# Patient Record
Sex: Male | Born: 1970 | Race: White | Hispanic: No | Marital: Single | Smoking: Never smoker
Health system: Southern US, Community
[De-identification: ages and names within clinical notes are randomized; demographics above are authoritative.]

## PROBLEM LIST (undated history)

## (undated) DIAGNOSIS — D649 Anemia, unspecified: Secondary | ICD-10-CM

## (undated) DIAGNOSIS — F419 Anxiety disorder, unspecified: Secondary | ICD-10-CM

## (undated) DIAGNOSIS — J302 Other seasonal allergic rhinitis: Secondary | ICD-10-CM

## (undated) DIAGNOSIS — G4730 Sleep apnea, unspecified: Secondary | ICD-10-CM

## (undated) DIAGNOSIS — K219 Gastro-esophageal reflux disease without esophagitis: Secondary | ICD-10-CM

## (undated) DIAGNOSIS — E785 Hyperlipidemia, unspecified: Secondary | ICD-10-CM

## (undated) DIAGNOSIS — K5792 Diverticulitis of intestine, part unspecified, without perforation or abscess without bleeding: Secondary | ICD-10-CM

## (undated) DIAGNOSIS — I10 Essential (primary) hypertension: Secondary | ICD-10-CM

## (undated) DIAGNOSIS — J189 Pneumonia, unspecified organism: Secondary | ICD-10-CM

## (undated) DIAGNOSIS — F32A Depression, unspecified: Secondary | ICD-10-CM

## (undated) DIAGNOSIS — T8859XA Other complications of anesthesia, initial encounter: Secondary | ICD-10-CM

## (undated) DIAGNOSIS — J45909 Unspecified asthma, uncomplicated: Secondary | ICD-10-CM

## (undated) DIAGNOSIS — F329 Major depressive disorder, single episode, unspecified: Secondary | ICD-10-CM

## (undated) DIAGNOSIS — T7840XA Allergy, unspecified, initial encounter: Secondary | ICD-10-CM

## (undated) DIAGNOSIS — R0600 Dyspnea, unspecified: Secondary | ICD-10-CM

## (undated) DIAGNOSIS — R519 Headache, unspecified: Secondary | ICD-10-CM

## (undated) DIAGNOSIS — M1990 Unspecified osteoarthritis, unspecified site: Secondary | ICD-10-CM

## (undated) HISTORY — DX: Depression, unspecified: F32.A

## (undated) HISTORY — DX: Unspecified asthma, uncomplicated: J45.909

## (undated) HISTORY — PX: COLON SURGERY: SHX602

## (undated) HISTORY — DX: Allergy, unspecified, initial encounter: T78.40XA

## (undated) HISTORY — PX: OTHER SURGICAL HISTORY: SHX169

## (undated) HISTORY — DX: Hyperlipidemia, unspecified: E78.5

## (undated) HISTORY — DX: Anemia, unspecified: D64.9

## (undated) HISTORY — DX: Unspecified osteoarthritis, unspecified site: M19.90

## (undated) HISTORY — DX: Major depressive disorder, single episode, unspecified: F32.9

---

## 1998-09-21 ENCOUNTER — Other Ambulatory Visit: Admission: RE | Admit: 1998-09-21 | Discharge: 1998-09-21 | Payer: Self-pay | Admitting: Gastroenterology

## 2000-08-16 ENCOUNTER — Encounter: Admission: RE | Admit: 2000-08-16 | Discharge: 2000-08-16 | Payer: Self-pay | Admitting: Orthopedic Surgery

## 2000-08-16 ENCOUNTER — Encounter: Payer: Self-pay | Admitting: Orthopedic Surgery

## 2001-09-05 ENCOUNTER — Encounter
Admission: RE | Admit: 2001-09-05 | Discharge: 2001-09-05 | Payer: Self-pay | Admitting: Physical Medicine and Rehabilitation

## 2001-09-05 ENCOUNTER — Encounter: Payer: Self-pay | Admitting: Physical Medicine and Rehabilitation

## 2001-09-22 ENCOUNTER — Encounter: Payer: Self-pay | Admitting: Internal Medicine

## 2001-09-22 ENCOUNTER — Encounter: Admission: RE | Admit: 2001-09-22 | Discharge: 2001-09-22 | Payer: Self-pay | Admitting: Internal Medicine

## 2001-10-21 ENCOUNTER — Ambulatory Visit (HOSPITAL_COMMUNITY): Admission: RE | Admit: 2001-10-21 | Discharge: 2001-10-21 | Payer: Self-pay | Admitting: Gastroenterology

## 2001-10-21 ENCOUNTER — Encounter: Payer: Self-pay | Admitting: Gastroenterology

## 2001-10-24 ENCOUNTER — Encounter: Payer: Self-pay | Admitting: Gastroenterology

## 2001-10-24 ENCOUNTER — Ambulatory Visit (HOSPITAL_COMMUNITY): Admission: RE | Admit: 2001-10-24 | Discharge: 2001-10-24 | Payer: Self-pay | Admitting: Gastroenterology

## 2007-01-13 ENCOUNTER — Ambulatory Visit (HOSPITAL_COMMUNITY): Admission: RE | Admit: 2007-01-13 | Discharge: 2007-01-13 | Payer: Self-pay | Admitting: Internal Medicine

## 2007-01-24 ENCOUNTER — Encounter: Admission: RE | Admit: 2007-01-24 | Discharge: 2007-01-24 | Payer: Self-pay | Admitting: Gastroenterology

## 2009-02-28 ENCOUNTER — Encounter: Payer: Self-pay | Admitting: Cardiology

## 2009-04-13 ENCOUNTER — Ambulatory Visit: Payer: Self-pay | Admitting: Cardiology

## 2009-04-13 DIAGNOSIS — I10 Essential (primary) hypertension: Secondary | ICD-10-CM | POA: Insufficient documentation

## 2009-04-13 DIAGNOSIS — R079 Chest pain, unspecified: Secondary | ICD-10-CM | POA: Insufficient documentation

## 2009-04-13 DIAGNOSIS — R109 Unspecified abdominal pain: Secondary | ICD-10-CM | POA: Insufficient documentation

## 2009-04-27 ENCOUNTER — Telehealth (INDEPENDENT_AMBULATORY_CARE_PROVIDER_SITE_OTHER): Payer: Self-pay | Admitting: *Deleted

## 2009-04-28 ENCOUNTER — Ambulatory Visit: Payer: Self-pay

## 2009-04-28 ENCOUNTER — Encounter: Payer: Self-pay | Admitting: Cardiology

## 2009-05-03 ENCOUNTER — Encounter: Payer: Self-pay | Admitting: Cardiology

## 2009-05-10 ENCOUNTER — Telehealth: Payer: Self-pay | Admitting: Cardiology

## 2011-11-04 ENCOUNTER — Ambulatory Visit (INDEPENDENT_AMBULATORY_CARE_PROVIDER_SITE_OTHER): Payer: BC Managed Care – PPO

## 2011-11-04 DIAGNOSIS — J111 Influenza due to unidentified influenza virus with other respiratory manifestations: Secondary | ICD-10-CM

## 2011-11-06 ENCOUNTER — Ambulatory Visit (INDEPENDENT_AMBULATORY_CARE_PROVIDER_SITE_OTHER): Payer: BC Managed Care – PPO

## 2011-11-06 DIAGNOSIS — R05 Cough: Secondary | ICD-10-CM

## 2011-11-06 DIAGNOSIS — R059 Cough, unspecified: Secondary | ICD-10-CM

## 2011-11-06 DIAGNOSIS — J209 Acute bronchitis, unspecified: Secondary | ICD-10-CM

## 2011-12-13 ENCOUNTER — Other Ambulatory Visit: Payer: Self-pay | Admitting: Internal Medicine

## 2011-12-13 ENCOUNTER — Other Ambulatory Visit: Payer: Self-pay | Admitting: Family Medicine

## 2011-12-17 ENCOUNTER — Other Ambulatory Visit: Payer: Self-pay | Admitting: Internal Medicine

## 2011-12-17 ENCOUNTER — Other Ambulatory Visit: Payer: Self-pay | Admitting: Family Medicine

## 2011-12-17 ENCOUNTER — Ambulatory Visit (INDEPENDENT_AMBULATORY_CARE_PROVIDER_SITE_OTHER): Payer: BC Managed Care – PPO | Admitting: Internal Medicine

## 2011-12-17 VITALS — BP 121/78 | HR 99 | Temp 98.0°F | Resp 16 | Ht 72.0 in | Wt 250.0 lb

## 2011-12-17 DIAGNOSIS — R0602 Shortness of breath: Secondary | ICD-10-CM

## 2011-12-17 DIAGNOSIS — H1130 Conjunctival hemorrhage, unspecified eye: Secondary | ICD-10-CM

## 2011-12-17 DIAGNOSIS — H1131 Conjunctival hemorrhage, right eye: Secondary | ICD-10-CM

## 2011-12-17 DIAGNOSIS — R002 Palpitations: Secondary | ICD-10-CM

## 2011-12-17 DIAGNOSIS — K219 Gastro-esophageal reflux disease without esophagitis: Secondary | ICD-10-CM

## 2011-12-17 DIAGNOSIS — G4700 Insomnia, unspecified: Secondary | ICD-10-CM

## 2011-12-17 MED ORDER — ZOLPIDEM TARTRATE 10 MG PO TABS: 10.0000 mg | ORAL_TABLET | Freq: Every evening | ORAL | Status: DC | PRN

## 2011-12-17 MED ORDER — ESOMEPRAZOLE MAGNESIUM 40 MG PO CPDR: 40.0000 mg | DELAYED_RELEASE_CAPSULE | Freq: Every day | ORAL | Status: DC

## 2011-12-17 NOTE — Telephone Encounter (Signed)
Rx was found on printer and not given to patient.  I called in Ambien to [facility] on [address].

## 2011-12-17 NOTE — Progress Notes (Signed)
  Subjective:    Patient ID: Randall Reed, male    DOB: September 06, 1971, 41 y.o.   MRN: [id number]  HPIthis 41 year old with known hypertension and obesity who is on medication for anxiety GERD hypertension and allergies had an event at the gym yesterday. He is pursuing vigorous exercise and attempt to lose weight and at the end of a treadmill walk noticed that he was having palpitations associated with shortness of breath. There was no chest pain and no diaphoresis. This episode took more than an hour to resolve. He took one Xanax and seemed to improve slightly but did notice that he was back to normal until bedtime. His blood pressure was just above normal after this event. He had no trouble sleeping and when he woke this morning all his symptoms were resolved.    Review of Systemshe has had no recent illness and has been doing well with his exercise program. His GERD is controlled with Nexium and it is time for a refill.his insomnia is responding very well to Ambien and he is also ready for refill on that     Objective:   Physical Examvital signs are within normal limits HEENT is normal except for a small sub-conjunctiva hemorrhage in the right eye inferiorly There is no thyromegaly The heart is regular with no murmur rub or gallop rate is 80 The lungs are clear Cranial nerves II through XII are intact and Romberg is negative    EKG is within normal limits    Assessment & Plan:  Problem #1 palpitations  problem #2shortness of breath Problem #3 subconjunctival hemorrhage  His event sounds like a form of paroxysmal atrial tachycardia. He has never had this before. He uses a supplement with 100 mg of caffeine in it but did not use that to access yesterday.  For now and to reassure him to continue exercising. If this event repeats itself we will order a Holter monitor  Both Nexium and Ambien were filled

## 2011-12-20 ENCOUNTER — Other Ambulatory Visit: Payer: Self-pay

## 2011-12-27 ENCOUNTER — Other Ambulatory Visit: Payer: Self-pay | Admitting: Internal Medicine

## 2012-01-24 ENCOUNTER — Other Ambulatory Visit: Payer: Self-pay | Admitting: Family Medicine

## 2012-01-24 MED ORDER — LISINOPRIL-HYDROCHLOROTHIAZIDE 10-12.5 MG PO TABS: 1.0000 | ORAL_TABLET | Freq: Every day | ORAL | Status: DC

## 2012-01-28 ENCOUNTER — Other Ambulatory Visit: Payer: Self-pay | Admitting: Internal Medicine

## 2012-02-04 ENCOUNTER — Ambulatory Visit (INDEPENDENT_AMBULATORY_CARE_PROVIDER_SITE_OTHER): Payer: BC Managed Care – PPO | Admitting: Internal Medicine

## 2012-02-04 VITALS — BP 120/80 | HR 80 | Temp 98.3°F | Resp 18 | Ht 70.5 in | Wt 236.0 lb

## 2012-02-04 DIAGNOSIS — R002 Palpitations: Secondary | ICD-10-CM

## 2012-02-04 DIAGNOSIS — Z6833 Body mass index (BMI) 33.0-33.9, adult: Secondary | ICD-10-CM | POA: Insufficient documentation

## 2012-02-04 DIAGNOSIS — K219 Gastro-esophageal reflux disease without esophagitis: Secondary | ICD-10-CM

## 2012-02-04 DIAGNOSIS — R079 Chest pain, unspecified: Secondary | ICD-10-CM

## 2012-02-04 DIAGNOSIS — G4700 Insomnia, unspecified: Secondary | ICD-10-CM

## 2012-02-04 DIAGNOSIS — F411 Generalized anxiety disorder: Secondary | ICD-10-CM | POA: Insufficient documentation

## 2012-02-04 DIAGNOSIS — I10 Essential (primary) hypertension: Secondary | ICD-10-CM

## 2012-02-04 DIAGNOSIS — J309 Allergic rhinitis, unspecified: Secondary | ICD-10-CM | POA: Insufficient documentation

## 2012-02-04 DIAGNOSIS — E669 Obesity, unspecified: Secondary | ICD-10-CM | POA: Insufficient documentation

## 2012-02-04 DIAGNOSIS — G43909 Migraine, unspecified, not intractable, without status migrainosus: Secondary | ICD-10-CM | POA: Insufficient documentation

## 2012-02-04 LAB — COMPREHENSIVE METABOLIC PANEL
ALT: 20 U/L (ref 0–53)
CO2: 29 mEq/L (ref 19–32)
Calcium: 10.3 mg/dL (ref 8.4–10.5)
Chloride: 103 mEq/L (ref 96–112)
Creat: 1.05 mg/dL (ref 0.50–1.35)
Glucose, Bld: 98 mg/dL (ref 70–99)
Sodium: 139 mEq/L (ref 135–145)
Total Bilirubin: 0.6 mg/dL (ref 0.3–1.2)
Total Protein: 7.5 g/dL (ref 6.0–8.3)

## 2012-02-04 LAB — T4, FREE: Free T4: 1.49 ng/dL (ref 0.80–1.80)

## 2012-02-04 LAB — POCT CBC
Granulocyte percent: 66.8 %G (ref 37–80)
HCT, POC: 46.7 % (ref 43.5–53.7)
Hemoglobin: 15.4 g/dL (ref 14.1–18.1)
MPV: 8.8 fL (ref 0–99.8)
POC Granulocyte: 5.3 (ref 2–6.9)
RBC: 5.28 M/uL (ref 4.69–6.13)

## 2012-02-04 LAB — TSH: TSH: 1.035 u[IU]/mL (ref 0.350–4.500)

## 2012-02-04 LAB — POCT GLYCOSYLATED HEMOGLOBIN (HGB A1C): Hemoglobin A1C: 4.9

## 2012-02-04 LAB — LIPID PANEL: Cholesterol: 176 mg/dL (ref 0–200)

## 2012-02-04 MED ORDER — ALPRAZOLAM 1 MG PO TABS: 1.0000 mg | ORAL_TABLET | Freq: Three times a day (TID) | ORAL | Status: DC | PRN

## 2012-02-04 MED ORDER — LISINOPRIL-HYDROCHLOROTHIAZIDE 10-12.5 MG PO TABS: 1.0000 | ORAL_TABLET | Freq: Every day | ORAL | Status: DC

## 2012-02-04 MED ORDER — FEXOFENADINE-PSEUDOEPHED ER 180-240 MG PO TB24: 1.0000 | ORAL_TABLET | Freq: Every day | ORAL | Status: DC

## 2012-02-04 NOTE — Progress Notes (Signed)
Subjective:    Patient ID: Randall Reed, male    DOB: [birth year], 41 y.o.   MRN: [id number]  HPIHe has had 2 recent episodes of chest pain associated with a shaky feeling in the aftermath of his exercise routine. These have not improved with eating and are not necessarily associated with dehydration. They may last up to an hour and a half. He thinks this shakiness or jitteriness may actually be palpitations. The chest pain is left anterior and radiates to the left neck or left shoulder and is not associated with shortness of breath or diaphoresis or nausea. He was evaluated Dr. Juanda Chance with an exercise treadmill test about 3 years ago which was normal. He has now lost 62 pounds with his exercise routine and describes no excessive fatigue or unusual symptoms while he is exercising. At his most recent event he seemed to stumble and lose his sense of where he was as he stepped into the whirlpool at the gym-This was fleeting but he continued to feel shaky for an hour and a half. He has a history of exercise-induced asthma probably related to his reflux disease but this has been inactive during his recent exercise. His reflux continues to be intermittently problematic. He was diagnosed with obstructive sleep apnea, but could not tolerate the mask, and now his symptoms have disappeared with dramatic weight loss. His blood pressure during these events as always around diastolic 90 His a.m. Blood pressure this week before medication has been diastolic 80   Family history is pertinent for severe coronary disease and death by myocardial infarction in his father who was also a diabetic Review of SystemsDenies night sweats, vision changes, orthopnea, edema, cough, nausea, constipation, urinary changes, memory loss, coordination problems, His irritable bowel symptoms are no longer present. His migraine headaches are fairly well controlled. His anxiety is stable. His insomnia has responded to Ambien very well. His  chronic back discomfort is treated by chiropractor [facility] with great success.    Objective:   Physical Exam Vital signs Stable except weight  HEENT is clear The heart is regular without murmurs rubs or gallops/No carotid bruits Lungs are clear There is no peripheral edema Neurological is intact   EKG is within normal limits including rhythm strip     Results for orders placed in visit on 02/04/12  POCT CBC      Component Value Range   WBC 8.0  4.6 - 10.2 (K/uL)   Lymph, poc 2.2  0.6 - 3.4    POC LYMPH PERCENT 27.2  10 - 50 (%L)   MID (cbc) 0.5  0 - 0.9    POC MID % 6.0  0 - 12 (%M)   POC Granulocyte 5.3  2 - 6.9    Granulocyte percent 66.8  37 - 80 (%G)   RBC 5.28  4.69 - 6.13 (M/uL)   Hemoglobin 15.4  14.1 - 18.1 (g/dL)   HCT, POC 78.2  95.6 - 53.7 (%)   MCV 88.4  80 - 97 (fL)   MCH, POC 29.2  27 - 31.2 (pg)   MCHC 33.0  31.8 - 35.4 (g/dL)   RDW, POC 21.3     Platelet Count, POC 248  142 - 424 (K/uL)   MPV 8.8  0 - 99.8 (fL)  POCT GLYCOSYLATED HEMOGLOBIN (HGB A1C)      Component Value Range   Hemoglobin A1C 4.9      Assessment & Plan:  Problem #1 Postexercise events that  include  chest pain and palpitations  We'll refer to Dr. Excell Seltzer for possible Holter, stress echo, and any other workup indicated Will recheck to see if HDL has increased with exercise Will recheck thyroid Metabolic profile to look for electrolyte disorders  Other problems: Patient Active Problem List  Diagnoses  . HYPERTENSION, BENIGN  . CHEST PAIN-UNSPECIFIED  . BMI 33.0-33.9,adult  . Allergic rhinitis  . GERD (gastroesophageal reflux disease)  . Migraine  . GAD (generalized anxiety disorder)  . Insomnia    Meds are brought up-to-date for 6-12 months as needed

## 2012-02-07 ENCOUNTER — Telehealth: Payer: Self-pay | Admitting: Internal Medicine

## 2012-02-07 ENCOUNTER — Encounter: Payer: Self-pay | Admitting: Internal Medicine

## 2012-02-07 NOTE — Telephone Encounter (Signed)
Please call Mr. Russom and tell him his labs were all normal and a mailing him a copy/

## 2012-02-07 NOTE — Telephone Encounter (Signed)
[facility] notifying patient below, and copy mailed.

## 2012-02-14 ENCOUNTER — Emergency Department (HOSPITAL_COMMUNITY): Payer: BC Managed Care – PPO

## 2012-02-14 ENCOUNTER — Observation Stay (HOSPITAL_COMMUNITY)
Admission: EM | Admit: 2012-02-14 | Discharge: 2012-02-15 | Disposition: A | Discharge status: Home / Self Care | Payer: BC Managed Care – PPO | Attending: Emergency Medicine | Admitting: Emergency Medicine

## 2012-02-14 ENCOUNTER — Ambulatory Visit (INDEPENDENT_AMBULATORY_CARE_PROVIDER_SITE_OTHER): Payer: BC Managed Care – PPO | Admitting: Family Medicine

## 2012-02-14 ENCOUNTER — Encounter (HOSPITAL_COMMUNITY): Payer: Self-pay | Admitting: Cardiology

## 2012-02-14 VITALS — BP 117/77 | HR 100 | Temp 98.7°F | Resp 16 | Ht 70.75 in | Wt 237.2 lb

## 2012-02-14 DIAGNOSIS — R079 Chest pain, unspecified: Principal | ICD-10-CM | POA: Insufficient documentation

## 2012-02-14 DIAGNOSIS — R410 Disorientation, unspecified: Secondary | ICD-10-CM

## 2012-02-14 DIAGNOSIS — M791 Myalgia, unspecified site: Secondary | ICD-10-CM

## 2012-02-14 DIAGNOSIS — R000 Tachycardia, unspecified: Secondary | ICD-10-CM

## 2012-02-14 DIAGNOSIS — I10 Essential (primary) hypertension: Secondary | ICD-10-CM | POA: Insufficient documentation

## 2012-02-14 DIAGNOSIS — R42 Dizziness and giddiness: Secondary | ICD-10-CM

## 2012-02-14 DIAGNOSIS — K219 Gastro-esophageal reflux disease without esophagitis: Secondary | ICD-10-CM | POA: Insufficient documentation

## 2012-02-14 DIAGNOSIS — R51 Headache: Secondary | ICD-10-CM | POA: Insufficient documentation

## 2012-02-14 DIAGNOSIS — F411 Generalized anxiety disorder: Secondary | ICD-10-CM | POA: Insufficient documentation

## 2012-02-14 DIAGNOSIS — R5383 Other fatigue: Secondary | ICD-10-CM

## 2012-02-14 DIAGNOSIS — G479 Sleep disorder, unspecified: Secondary | ICD-10-CM

## 2012-02-14 DIAGNOSIS — R5381 Other malaise: Secondary | ICD-10-CM

## 2012-02-14 HISTORY — DX: Gastro-esophageal reflux disease without esophagitis: K21.9

## 2012-02-14 HISTORY — DX: Essential (primary) hypertension: I10

## 2012-02-14 HISTORY — DX: Anxiety disorder, unspecified: F41.9

## 2012-02-14 HISTORY — DX: Other seasonal allergic rhinitis: J30.2

## 2012-02-14 LAB — POCT CBC
MCH, POC: 29.4 pg (ref 27–31.2)
MCHC: 33.4 g/dL (ref 31.8–35.4)
MCV: 88 fL (ref 80–97)
MID (cbc): 0.8 (ref 0–0.9)
MPV: 8.5 fL (ref 0–99.8)
POC LYMPH PERCENT: 29.1 %L (ref 10–50)
POC MID %: 7.4 %M (ref 0–12)
Platelet Count, POC: 289 10*3/uL (ref 142–424)
RDW, POC: 14.1 %
WBC: 10.5 10*3/uL — AB (ref 4.6–10.2)

## 2012-02-14 LAB — DIFFERENTIAL
Basophils Relative: 0 % (ref 0–1)
Eosinophils Absolute: 0.1 10*3/uL (ref 0.0–0.7)
Neutrophils Relative %: 63 % (ref 43–77)

## 2012-02-14 LAB — CBC
MCH: 29.8 pg (ref 26.0–34.0)
MCHC: 35 g/dL (ref 30.0–36.0)
Platelets: 217 10*3/uL (ref 150–400)

## 2012-02-14 LAB — POCT URINALYSIS DIPSTICK
Glucose, UA: NEGATIVE
Protein, UA: NEGATIVE
Spec Grav, UA: 1.015
Urobilinogen, UA: 0.2
pH, UA: 6

## 2012-02-14 LAB — BASIC METABOLIC PANEL
GFR calc Af Amer: >90 mL/min (ref >90)
GFR calc non Af Amer: >90 mL/min (ref >90)
Potassium: 3.9 mEq/L (ref 3.5–5.1)
Sodium: 138 mEq/L (ref 135–145)

## 2012-02-14 LAB — TROPONIN I: Troponin I: <0.3 ng/mL (ref <0.30)

## 2012-02-14 MED ORDER — NITROGLYCERIN 0.4 MG SL SUBL: 0.4000 mg | SUBLINGUAL_TABLET | SUBLINGUAL | Status: DC | PRN

## 2012-02-14 MED ORDER — ZOLPIDEM TARTRATE 5 MG PO TABS
5.0000 mg | ORAL_TABLET | Freq: Every evening | ORAL | Status: DC | PRN
  Administered 2012-02-14: 5 mg via ORAL
  Filled 2012-02-14: qty 1

## 2012-02-14 MED ORDER — ACETAMINOPHEN 500 MG PO TABS
1000.0000 mg | ORAL_TABLET | Freq: Once | ORAL | Status: AC
  Administered 2012-02-14: 1000 mg via ORAL
  Filled 2012-02-14: qty 2

## 2012-02-14 MED ORDER — ALPRAZOLAM 0.5 MG PO TABS
1.0000 mg | ORAL_TABLET | Freq: Once | ORAL | Status: AC
  Administered 2012-02-14: 1 mg via ORAL
  Filled 2012-02-14: qty 2

## 2012-02-14 MED ORDER — ASPIRIN 81 MG PO CHEW
324.0000 mg | CHEWABLE_TABLET | Freq: Once | ORAL | Status: AC
  Administered 2012-02-14: 324 mg via ORAL
  Filled 2012-02-14: qty 4

## 2012-02-14 MED ORDER — ASPIRIN 81 MG PO TABS: 324.0000 mg | ORAL_TABLET | Freq: Once | ORAL | Status: DC

## 2012-02-14 NOTE — Progress Notes (Signed)
Subjective:    Patient ID: Randall Reed, male    DOB: April 13, 1971, 41 y.o.   MRN: [id number]  HPI Randall Reed is a 41 y.o. male Hx of HTN, allergic rhinitis, EIA, GERD, and anxiety by chart - seen in past by Dr. Merla Riches See office visits from 12/17/11, and most recently 02/04/12.    Recurrence of symptoms yesterday with exercise.  Has been working out with [employer].  After workout - HR remained high, heart racing, felt chest pain L side - 4/10.  No radiation of CP, but felt dyspneic, and nausea later. CP and tachycardia lasted about 20 minutes. Tried to walk it off, but legs felt weak, and sore, felt too weak to stand, then when sitting, unable to stand back up, or call for help - couldn't form words.  Vision was blurry during this episode as in prior episodes.  After about 10 minutes, was able to vocalize to ask for assistance.  Gym employees gave him a granola bar - but he wans;t able to have the dexterity to open it.  Was able to eat the granola bar and drink afterwards.  Speech started to improve 30 minutes after granola bar and 1/2 protein shake, then strength returned slowly after 1 and 1/2 hours. Today - went to work, but still feels fatigued and not quite right.  Generally tired, and trouble with focus/concentration and verbalizing thoughts.  Slight headache today - like a hangover, but no focal muscle weakness, or slurred speech today.  Denies current chest pain, nausea, vomiting or dyspnea. No recent dark/tarry stools.  Took one half of his xanax this am, but this usually does not give him thee side effects.  Has lost 65 pounds past 14 months intentionally with exercise.   Etoh - 2-3 drinks per week, last intake 1 week ago, no hx of withdrawal.  Nonsmoker.   Per chart at 5/12 office visit - had chest pain, plan to follow up at [facility] cardiology.  ETT by Dr. Juanda Chance ~2010 WNL per chart - report/note unavailable.  05/16/10: dizziness/lightheaded, tired, and cramps in calves. Potassium  3.4   08/27/10:  Didn't feel right.  Had been out of [place] x 1 month.   Review of Systems  Constitutional: Positive for fatigue. Negative for fever and chills.  Respiratory: Positive for chest tightness and shortness of breath.   Cardiovascular: Positive for chest pain.  Gastrointestinal: Positive for nausea. Negative for blood in stool and anal bleeding.       No dark tarry stools.  Neurological: Positive for dizziness, syncope, weakness and light-headedness.     As per HPI.    Objective:   Physical Exam  Constitutional: He is oriented to person, place, and time. He appears well-developed and well-nourished.  Eyes: Conjunctivae and EOM are normal. Pupils are equal, round, and reactive to light.       No nystagmus  Neck: Normal range of motion and phonation normal. Neck supple. Carotid bruit is not present. No thyromegaly present.  Cardiovascular: Regular rhythm, normal heart sounds and normal pulses.   No extrasystoles are present. Tachycardia present.  PMI is not displaced.   Pulmonary/Chest: Effort normal and breath sounds normal. No accessory muscle usage. Not tachypneic. No respiratory distress.  Abdominal: Normal appearance.  Musculoskeletal: Normal range of motion.  Lymphadenopathy:    He has no cervical adenopathy.  Neurological: He is alert and oriented to person, place, and time. He has normal strength. He displays no tremor. No cranial nerve deficit  or sensory deficit. He displays a negative Romberg sign. He displays no seizure activity. Coordination and gait normal. GCS eye subscore is 4. GCS verbal subscore is 5. GCS motor subscore is 6.       No pronator drift, normal heel to toe and finger to nose.  No nystagmus.  Equal palate elevation, No focal weakness appreciated.  Skin: Skin is warm, dry and intact.    Results for orders placed in visit on 02/14/12  POCT CBC      Component Value Range   WBC 10.5 (*) 4.6 - 10.2 (K/uL)   Lymph, poc 3.1  0.6 - 3.4    POC LYMPH  PERCENT 29.1  10 - 50 (%L)   MID (cbc) 0.8  0 - 0.9    POC MID % 7.4  0 - 12 (%M)   POC Granulocyte 6.7  2 - 6.9    Granulocyte percent 63.5  37 - 80 (%G)   RBC 5.20  4.69 - 6.13 (M/uL)   Hemoglobin 15.3  14.1 - 18.1 (g/dL)   HCT, POC 29.5  62.1 - 53.7 (%)   MCV 88.0  80 - 97 (fL)   MCH, POC 29.4  27 - 31.2 (pg)   MCHC 33.4  31.8 - 35.4 (g/dL)   RDW, POC 30.8     Platelet Count, POC 289  142 - 424 (K/uL)   MPV 8.5  0 - 99.8 (fL)  POCT URINALYSIS DIPSTICK      Component Value Range   Color, UA yellow     Clarity, UA clear     Glucose, UA neg     Bilirubin, UA neg     Ketones, UA neg     Spec Grav, UA 1.015     Blood, UA trace-intact     pH, UA 6.0     Protein, UA neg     Urobilinogen, UA 0.2     Nitrite, UA neg     Leukocytes, UA Negative    GLUCOSE, POCT (MANUAL RESULT ENTRY)      Component Value Range   POC Glucose 82     EKG without apparent change from 12/2011. Sinus tachycardia.    Assessment & Plan:  Randall Reed is a 41 y.o. male  Hx. of HTN, anxiety, GERD,  And paternal FH of CAD, and DM. multiple episodes past two months of fatigue, persistent tachycardia, accompanied by L sided chest pain with exercise. Worst episode yesterday, with 1 and 1/2 hour episode of total body weakness, and difficulty with speech, with residual symptoms currently of persistent concentration/focus problems, malaise, generalized fatigue and difficulty with verbalizing thoughts at times.  No focal wakness  Does admit to some increase in stress recently.  No apparent changes on EKG, and exam overall reassuring.    DDX includes ACS yesterday vs electrolyte imbalance (hypokalemia) and overexertion, but with mental status change with some persistence of HA and confusion/trouble with verbalizing thoughts, will transport to [facility] ER for eval, with possible stat lytes/CT head/cardiac enzymes.  Recent TSH and CMP on April 1st overall reassuring.   Cardiology OV pending on April 30th. IV placed, placed  on heart monitor, tx by EMS.

## 2012-02-14 NOTE — ED Provider Notes (Signed)
Patient moved to CDU under chest pain protocol.  Patient currently chest pain free.  Prior headache has resolved after tylenol.  Lungs CTA bilaterally.  S1/S2, RRR, no murmur.  Abdomen soft, bowel sounds present.  Strong distal pulses palpated all extremities.  12 lead reviewed, no indication of ischemia.  Normal cardiac markers.  Diagnostic plan discussed with patient.  Patient scheduled for coronary CT in AM.  11:38 PM  Report provided to Dr. Hyacinth Meeker.  Jimmye Norman, NP 02/14/12 [phone]

## 2012-02-14 NOTE — ED Provider Notes (Signed)
History     CSN: [id number]  Arrival date & time 02/14/12  1710   First MD Initiated Contact with Patient 02/14/12 1714      Chief Complaint  Patient presents with  . Chest Pain   Location-L chest/radiation-to arm/quality-dull/duration-~24 hours/timing-constant/severity-mild/No associated sxs/No prior treatment) Patient is a 41 y.o. male presenting with chest pain.  Chest Pain The chest pain began yesterday. Chest pain occurs intermittently. The chest pain is improving. The pain is associated with exertion. At its most intense, the pain is at 2/10. The pain is currently at 2/10. The severity of the pain is mild. The quality of the pain is described as tightness and similar to previous episodes. The pain radiates to the left arm. Chest pain is worsened by exertion. Pertinent negatives for primary symptoms include no fever, no fatigue, no syncope, no shortness of breath, no cough, no wheezing, no palpitations, no abdominal pain, no nausea, no vomiting, no dizziness and no altered mental status.  Pertinent negatives for associated symptoms include no diaphoresis, no lower extremity edema, no near-syncope, no numbness, no orthopnea, no paroxysmal nocturnal dyspnea and no weakness. He tried nothing for the symptoms. Risk factors include male gender, obesity and stress.  His past medical history is significant for hypertension.  Pertinent negatives for past medical history include no seizures.  His family medical history is significant for early MI in family and sudden death in family.  Procedure history is positive for exercise treadmill test.  Procedure history is negative for cardiac catheterization, echocardiogram and stress echo.     Past Medical History  Diagnosis Date  . GERD (gastroesophageal reflux disease)   . Hypertension   . Anxiety   . Seasonal allergic reaction     History reviewed. No pertinent past surgical history.  History reviewed. No pertinent family  history.  History  Substance Use Topics  . Smoking status: Never Smoker   . Smokeless tobacco: Not on file  . Alcohol Use: Yes      Review of Systems  Constitutional: Negative for fever, chills, diaphoresis and fatigue.  HENT: Negative for neck pain and neck stiffness.   Eyes: Negative for visual disturbance.  Respiratory: Positive for chest tightness. Negative for cough, shortness of breath, wheezing and stridor.   Cardiovascular: Negative for chest pain, palpitations, orthopnea, leg swelling, syncope and near-syncope.  Gastrointestinal: Negative for nausea, vomiting, abdominal pain, diarrhea, constipation, blood in stool and abdominal distention.  Genitourinary: Negative for dysuria, urgency, hematuria and difficulty urinating.  Musculoskeletal: Negative for back pain and gait problem.  Skin: Negative for rash.  Neurological: Negative for dizziness, tremors, seizures, syncope, facial asymmetry, speech difficulty, weakness, light-headedness, numbness and headaches.  Hematological: Negative for adenopathy. Does not bruise/bleed easily.  Psychiatric/Behavioral: Negative for confusion and altered mental status.    Allergies  Desloratadine; Loratadine; Telithromycin; Tussionex pennkinetic er; and Levbid  Home Medications   Current Outpatient Rx  Name Route Sig Dispense Refill  . ALPRAZOLAM 1 MG PO TABS Oral Take 1 mg by mouth at bedtime. May take 2 additional doses if needed for anxiety    . CYCLOBENZAPRINE HCL 10 MG PO TABS Oral Take 10 mg by mouth as needed. Muscle spasms/teething grinding    . DICLOFENAC SODIUM 75 MG PO TBEC Oral Take 75 mg by mouth daily as needed. For pain    . ESOMEPRAZOLE MAGNESIUM 40 MG PO CPDR Oral Take 40 mg by mouth daily before breakfast.    . FEXOFENADINE-PSEUDOEPHED ER 180-240 MG PO TB24 Oral Take  1 tablet by mouth daily.    Marland Kitchen FLUTICASONE FUROATE 27.5 MCG/SPRAY NA SUSP Nasal Place 2 sprays into the nose daily as needed. For nasal congestion    .  IBUPROFEN 800 MG PO TABS Oral Take 800 mg by mouth every 8 (eight) hours as needed. For pain.    Marland Kitchen LISINOPRIL-HYDROCHLOROTHIAZIDE 10-12.5 MG PO TABS Oral Take 1 tablet by mouth daily.    Marland Kitchen METHOCARBAMOL 750 MG PO TABS Oral Take 750 mg by mouth daily as needed. For muscle spasms    . MONTELUKAST SODIUM 10 MG PO TABS Oral Take 10 mg by mouth at bedtime.     Marland Kitchen NABUMETONE 750 MG PO TABS Oral Take 750 mg by mouth as needed. For muscle spasms    . RIZATRIPTAN BENZOATE 10 MG PO TABS Oral Take 10 mg by mouth 2 (two) times daily as needed. May repeat in 2 hours if needed for migraine    . TRAMADOL HCL 50 MG PO TABS Oral Take 50 mg by mouth every 8 (eight) hours as needed. For pain.    Marland Kitchen ZOLPIDEM TARTRATE 10 MG PO TABS Oral Take 10 mg by mouth at bedtime.       BP 127/86  Pulse 84  Temp(Src) 97.9 F (36.6 C) (Oral)  Resp 16  SpO2 98%  Physical Exam  Constitutional: He is oriented to person, place, and time. He appears well-developed and well-nourished. No distress.  HENT:  Head: Normocephalic.  Eyes: Conjunctivae are normal.  Neck: Normal range of motion. Neck supple.  Cardiovascular: Normal rate, regular rhythm, normal heart sounds and intact distal pulses.   No murmur heard. Pulmonary/Chest: Effort normal and breath sounds normal. No respiratory distress. He has no wheezes. He has no rales. He exhibits no tenderness.  Abdominal: Soft. Bowel sounds are normal. He exhibits no distension and no mass. There is no tenderness. There is no rebound and no guarding.  Musculoskeletal: Normal range of motion. He exhibits no edema and no tenderness.  Neurological: He is alert and oriented to person, place, and time. He has normal strength. No cranial nerve deficit or sensory deficit. Coordination normal. GCS eye subscore is 4. GCS verbal subscore is 5. GCS motor subscore is 6.  Skin: Skin is warm and dry. He is not diaphoretic.  Psychiatric: He has a normal mood and affect.    ED Course  Procedures  (including critical care time) Results for orders placed during the hospital encounter of 02/14/12  CBC      Component Value Range   WBC 9.0  4.0 - 10.5 (K/uL)   RBC 5.04  4.22 - 5.81 (MIL/uL)   Hemoglobin 15.0  13.0 - 17.0 (g/dL)   HCT 11.9  14.7 - 82.9 (%)   MCV 84.9  78.0 - 100.0 (fL)   MCH 29.8  26.0 - 34.0 (pg)   MCHC 35.0  30.0 - 36.0 (g/dL)   RDW 56.2  13.0 - 86.5 (%)   Platelets 217  150 - 400 (K/uL)  DIFFERENTIAL      Component Value Range   Neutrophils Relative 63  43 - 77 (%)   Neutro Abs 5.7  1.7 - 7.7 (K/uL)   Lymphocytes Relative 29  12 - 46 (%)   Lymphs Abs 2.6  0.7 - 4.0 (K/uL)   Monocytes Relative 7  3 - 12 (%)   Monocytes Absolute 0.7  0.1 - 1.0 (K/uL)   Eosinophils Relative 1  0 - 5 (%)   Eosinophils Absolute 0.1  0.0 - 0.7 (K/uL)   Basophils Relative 0  0 - 1 (%)   Basophils Absolute 0.0  0.0 - 0.1 (K/uL)  BASIC METABOLIC PANEL      Component Value Range   Sodium 138  135 - 145 (mEq/L)   Potassium 3.9  3.5 - 5.1 (mEq/L)   Chloride 102  96 - 112 (mEq/L)   CO2 25  19 - 32 (mEq/L)   Glucose, Bld 92  70 - 99 (mg/dL)   BUN 12  6 - 23 (mg/dL)   Creatinine, Ser 1.61  0.50 - 1.35 (mg/dL)   Calcium 9.1  8.4 - 09.6 (mg/dL)   GFR calc non Af Amer >90  >90 (mL/min)   GFR calc Af Amer >90  >90 (mL/min)  TROPONIN I      Component Value Range   Troponin I <0.30  <0.30 (ng/mL)  TROPONIN I      Component Value Range   Troponin I <0.30  <0.30 (ng/mL)    Labs Reviewed  CBC  DIFFERENTIAL  BASIC METABOLIC PANEL  TROPONIN I  TROPONIN I   No results found.   No diagnosis found.      DG Chest 2 View (Final result)   Result time:02/14/12 1824    Final result by Rad Results In Interface (02/14/12 18:24:38)    Narrative:   *RADIOLOGY REPORT*  Clinical Data: Chest pain and history of asthma.  CHEST - 2 VIEW  Comparison: 11/06/11  Findings: Increased density projecting over the thoracic spine posteriorly on the lateral view is favored to be due to  overlapping osteophytes/osseous shadows. Likely present 11/06/2011, but more apparent today. No correlate in this area on the frontal view.  Midline trachea. Normal heart size and mediastinal contours. No pleural effusion or pneumothorax. Mild right hemidiaphragm eventration anteriorly. Lung volumes are mildly low. Clear lungs.  IMPRESSION:  1. No acute cardiopulmonary disease. 2. Probable osteophyte or osseous summation shadow projecting over the spine on the lateral view. Likely present on 11/06/2011, but more apparent today secondary to positioning. Consider plain film follow-up at 3 months, to confirm stability of this appearance.  Original Report Authenticated By: Consuello Bossier, M.D.   MDM  Pt is a well appearing 41yo M with TIMI-2 score by risk fx and ASA use but no formal cardiac hx who presents with L side CP since yesterday that started after exercise. Similar to several other past episodes over last 2 months. VSS. AF. EKG unremarkable. No concerning exam findings. Per PCP note pt was sent here for cardiac and neuro w/u. Pt states hx of migraines and todays HA is very mild and isn't really bothering him presently. HA much improved with darkness. No focal neuro deficits. Neuro w/u not felt to be necessary at this time. Given CP, risk factors and family hx cardiac w/u pending with plans for coronary CT pending normal labs.   Labs, CXR and EKG unremarkable. CP virtually resolved spontaneously on reassessment. Pt instructed to f/u in 3 months for repeat T-spine film given nodule on todays report. Pt verbalized understanding.    Date: 02/14/2012  Rate: 83  Rhythm: normal sinus rhythm  QRS Axis: normal  Intervals: normal  ST/T Wave abnormalities: normal  Conduction Disutrbances:none  Narrative Interpretation:   Old EKG Reviewed: none available          Consuello Masse, MD 02/15/12 0030

## 2012-02-14 NOTE — ED Notes (Addendum)
After workout yesterday had difficulty walking down stairs. Unable to speak for app 10 minutes. States he didn't feel right today at lunch. "I was there but I wasn't". Sent here from [facility]. States has had similar episodes

## 2012-02-14 NOTE — ED Notes (Signed)
MD at bedside. 

## 2012-02-14 NOTE — ED Provider Notes (Signed)
Pt here with CP, improving at this time but has had at the end of workouts He is awake/alert, no distress EKG reviewed Would benefit for CDU monitoring and imaging in the morning BP 127/86  Pulse 84  Temp(Src) 97.9 F (36.6 C) (Oral)  Resp 16  SpO2 98%  Family History - positive for CAD  Joya Gaskins, MD 02/14/12 [id number]

## 2012-02-14 NOTE — ED Notes (Signed)
Pt to department via EMS from [facility] with c/o chest pain and fatigue that started a couple of weeks ago. Reports that he went today to [facility] for evalution and was sent here. Pain is worse with activity. BP- 120/80 HR-80

## 2012-02-14 NOTE — ED Provider Notes (Signed)
Medical screening examination/treatment/procedure(s) were conducted as a shared visit with non-physician practitioner(s) and myself.  I personally evaluated the patient during the encounter   Joya Gaskins, MD 02/14/12 2341

## 2012-02-15 ENCOUNTER — Telehealth: Payer: Self-pay

## 2012-02-15 ENCOUNTER — Observation Stay (HOSPITAL_COMMUNITY)
Admit: 2012-02-15 | Discharge: 2012-02-15 | Disposition: A | Discharge status: Home / Self Care | Payer: BC Managed Care – PPO | Attending: Emergency Medicine | Admitting: Emergency Medicine

## 2012-02-15 DIAGNOSIS — H538 Other visual disturbances: Secondary | ICD-10-CM

## 2012-02-15 DIAGNOSIS — R410 Disorientation, unspecified: Secondary | ICD-10-CM

## 2012-02-15 DIAGNOSIS — R519 Headache, unspecified: Secondary | ICD-10-CM

## 2012-02-15 MED ORDER — SODIUM CHLORIDE 0.9 % IV BOLUS (SEPSIS)
1000.0000 mL | Freq: Once | INTRAVENOUS | Status: AC
  Administered 2012-02-15: 1000 mL via INTRAVENOUS

## 2012-02-15 MED ORDER — METOPROLOL TARTRATE 25 MG PO TABS
100.0000 mg | ORAL_TABLET | Freq: Once | ORAL | Status: AC
  Administered 2012-02-15: 100 mg via ORAL
  Filled 2012-02-15: qty 4

## 2012-02-15 MED ORDER — METOPROLOL TARTRATE 1 MG/ML IV SOLN
5.0000 mg | Freq: Once | INTRAVENOUS | Status: AC
  Administered 2012-02-15: 5 mg via INTRAVENOUS

## 2012-02-15 MED ORDER — ACETAMINOPHEN 500 MG PO TABS
1000.0000 mg | ORAL_TABLET | Freq: Once | ORAL | Status: AC
  Administered 2012-02-15: 1000 mg via ORAL
  Filled 2012-02-15 (×2): qty 2

## 2012-02-15 MED ORDER — NITROGLYCERIN 0.4 MG SL SUBL
0.4000 mg | SUBLINGUAL_TABLET | Freq: Once | SUBLINGUAL | Status: AC
  Administered 2012-02-15: 0.4 mg via SUBLINGUAL

## 2012-02-15 MED ORDER — IOHEXOL 350 MG/ML SOLN
100.0000 mL | Freq: Once | INTRAVENOUS | Status: AC | PRN
  Administered 2012-02-15: 100 mL via INTRAVENOUS

## 2012-02-15 MED ORDER — METOPROLOL TARTRATE 1 MG/ML IV SOLN
INTRAVENOUS | Status: AC
  Filled 2012-02-15: qty 10

## 2012-02-15 MED ORDER — NITROGLYCERIN 0.4 MG SL SUBL
SUBLINGUAL_TABLET | SUBLINGUAL | Status: AC
  Filled 2012-02-15: qty 25

## 2012-02-15 NOTE — Discharge Instructions (Signed)
Follow up with Dr. Excell Seltzer as scheduled.  Make sure you drink plenty of water while your working out. You should return to the ER if your symptoms worsen.

## 2012-02-15 NOTE — ED Provider Notes (Signed)
Medical screening examination/treatment/procedure(s) were conducted as a shared visit with non-physician practitioner(s) and myself.  I personally evaluated the patient during the encounter   Randall Gaskins, MD 02/15/12 [phone]

## 2012-02-15 NOTE — ED Notes (Signed)
bmi is 32.1

## 2012-02-15 NOTE — Telephone Encounter (Signed)
Dr. Merla Riches or Dr. Neva Seat: patient was sent to hospital yesterday from our office. Patient has several questions regarding results and xray results.

## 2012-02-15 NOTE — Progress Notes (Signed)
Observation review is complete. 

## 2012-02-15 NOTE — ED Provider Notes (Signed)
Pt in CDU on CP protocol.  Has had multiple episodes of post-exertional, substernal CP that occasionally radiates to left neck over the past couple months; most recently yesterday after working out at gym.  Reports that he slept well over night but had intermittent, chest discomfort and headache.  On repeat exam, pt in NAD, VS w/in nml range, heart w/ RRR, lungs CTA, chest non-tender.  Coronary CT pending.  8:48 AM   CT neg for CAD and calcium score is 0. 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  He has a f/u appt. W/ Dr. Excell Seltzer at the end of the month.  Return precautions discussed. 11:37 AM   Otilio Miu, PA 02/15/12 1137

## 2012-02-15 NOTE — ED Provider Notes (Signed)
I have reviewed and agree with the ECG interpretation(s) documented by the resident.  I have personally seen and examined the patient.  I have discussed the plan of care with the resident.  I have reviewed the documentation on PMH/FH/Soc. History.  I have reviewed the documentation of the resident and agree.  PT well appearing, no distress, CT coronary negative Advise f/u with cardiology and EPIC message sent to PCP concerning repeat CXR in 3 months.  PT also will need to cut back on exercise regime as he seems to be symptomatic   Joya Gaskins, MD 02/15/12 1512

## 2012-02-15 NOTE — ED Notes (Signed)
Complaining of headache. Notified EDP

## 2012-02-17 ENCOUNTER — Telehealth: Payer: Self-pay

## 2012-02-17 NOTE — Telephone Encounter (Signed)
PT SAW DR Merla Riches ON 4/1 AND DR Neva Seat ON 4/11.  WAS SENT TO [facility] ON 4/11.  SAYS THEY DID NOT FIND ANYTHING.  DOESN'T REMEMBER DR ADVICE FROM 4/11 AND IS STILL HAVING PROBLEMS.  WANTS TO KNOW WHAT HE SHOULD DO.  BEST NUMBER IS [phone]

## 2012-02-18 NOTE — Telephone Encounter (Signed)
Due to headache, change in sensorium, change in vision, and continued post event fatigue(See discussion of confusion at office visit) he needs a head CT without contrast to rule out a recent bleed if normal we should set up followup with neurology for further testing

## 2012-02-18 NOTE — Telephone Encounter (Signed)
Called pt who reported that the ED did a cardiac CT and labs when he was sent there and kept giving him ASA, but they never did anything to address whether pt had a TIA/mild stroke. Pt continues to have a dull HA, sometimes a little more severe, but NOT a migraine. He is also still "very very tired and his co-workers keep saying he needs to go home". He is also still having a little bit of tightness/mild pain in his upper left chest and states that his vision is not impaired but just seems "a little weird". Pt describes that he "feels like he has a hangover w/out having gone to the party". Pt asks whether we can still order a CT head, and also whether we might be able to get his appt to [facility] moved up sooner than Apr 30. Dr Merla Riches, I'm forwarding this to you since you are pt's PCP and Dr Neva Seat is not in office - see last OV notes and notes/labs from ED visit.

## 2012-02-18 NOTE — Telephone Encounter (Signed)
Dr Merla Riches, we still can not put in orders for referrals. Can you please order referral of CT head? I have called pt to let him know we are starting referral. Lupita Leash, can you please check w/[facility] to see if they can get pt in any sooner than the 30th?

## 2012-02-18 NOTE — Telephone Encounter (Signed)
LMOM to CB. 

## 2012-02-19 NOTE — Telephone Encounter (Signed)
Spoke w/pt on 4/15. See note under other phone message

## 2012-02-19 NOTE — Telephone Encounter (Signed)
Checked w/Referrals and they are going to check w/[facility] to see if appt can be moved up.

## 2012-02-19 NOTE — Telephone Encounter (Signed)
ordered

## 2012-02-20 ENCOUNTER — Ambulatory Visit
Admission: RE | Admit: 2012-02-20 | Discharge: 2012-02-20 | Disposition: A | Discharge status: Home / Self Care | Payer: BC Managed Care – PPO | Source: Ambulatory Visit | Attending: Internal Medicine | Admitting: Internal Medicine

## 2012-02-20 DIAGNOSIS — H538 Other visual disturbances: Secondary | ICD-10-CM

## 2012-02-20 DIAGNOSIS — R410 Disorientation, unspecified: Secondary | ICD-10-CM

## 2012-02-20 DIAGNOSIS — R519 Headache, unspecified: Secondary | ICD-10-CM

## 2012-02-21 ENCOUNTER — Other Ambulatory Visit: Payer: Self-pay

## 2012-02-21 ENCOUNTER — Encounter (INDEPENDENT_AMBULATORY_CARE_PROVIDER_SITE_OTHER): Payer: BC Managed Care – PPO

## 2012-02-21 ENCOUNTER — Encounter: Payer: Self-pay | Admitting: Cardiovascular Disease

## 2012-02-21 ENCOUNTER — Ambulatory Visit (INDEPENDENT_AMBULATORY_CARE_PROVIDER_SITE_OTHER): Payer: BC Managed Care – PPO | Admitting: Cardiovascular Disease

## 2012-02-21 ENCOUNTER — Ambulatory Visit (HOSPITAL_COMMUNITY): Payer: BC Managed Care – PPO | Attending: Cardiology

## 2012-02-21 DIAGNOSIS — R002 Palpitations: Secondary | ICD-10-CM | POA: Insufficient documentation

## 2012-02-21 DIAGNOSIS — I10 Essential (primary) hypertension: Secondary | ICD-10-CM | POA: Insufficient documentation

## 2012-02-21 DIAGNOSIS — E669 Obesity, unspecified: Secondary | ICD-10-CM | POA: Insufficient documentation

## 2012-02-21 DIAGNOSIS — R079 Chest pain, unspecified: Secondary | ICD-10-CM

## 2012-02-21 DIAGNOSIS — I517 Cardiomegaly: Secondary | ICD-10-CM | POA: Insufficient documentation

## 2012-02-21 DIAGNOSIS — I4891 Unspecified atrial fibrillation: Secondary | ICD-10-CM

## 2012-02-21 DIAGNOSIS — R072 Precordial pain: Secondary | ICD-10-CM

## 2012-02-21 NOTE — Assessment & Plan Note (Signed)
He has been relatively hypotensive at home per pt. Will have him hold his Lisinopril/HCT combo pill for now with dizziness.

## 2012-02-21 NOTE — Progress Notes (Signed)
History of Present Illness: 41 yo WM with history of HTN, GERD, anxiety who is referred today for evaluation of palpitations and chest pain. He has had episodes of chest pain over the last few years. He was seen by Charlies Constable in June 2010 and had a normal nuclear stress study with no evidence of ischemia. He was seen in the [facility] ED on 02/14/12 with c/o left sided chest pain. Coronary CTA with no evidence of CAD. (see below). He also described some problems with memory, ability to form thoughts. Head CT yesterday with no acute findings.   He tells me that he first had problems on 12/15/11 when after a workout his heart rate would not decrease. It took about two hours for it to resolve. EKG was normal in primary care the next day. He does have anxiety. Last week, he had a good workout and had increased heart rate that would not come down. He then had confusion, blurry vision, could not move. He says it took 90 minutes to get out of his chair.   Primary Care Physician: Dr. Merla Riches  Last Lipid Profile:  Past Medical History  Diagnosis Date  . GERD (gastroesophageal reflux disease)   . Hypertension   . Anxiety   . Seasonal allergic reaction     Past Surgical History  Procedure Date  . None   . None     Current Outpatient Prescriptions  Medication Sig Dispense Refill  . albuterol (PROVENTIL HFA;VENTOLIN HFA) 108 (90 BASE) MCG/ACT inhaler Inhale 2 puffs into the lungs every 6 (six) hours as needed.      . ALPRAZolam (XANAX) 1 MG tablet Take 1 mg by mouth at bedtime. May take 2 additional doses if needed for anxiety      . cyclobenzaprine (FLEXERIL) 10 MG tablet Take 10 mg by mouth as needed. Muscle spasms/teething grinding      . diclofenac (VOLTAREN) 75 MG EC tablet Take 75 mg by mouth daily as needed. For pain      . esomeprazole (NEXIUM) 40 MG capsule Take 40 mg by mouth daily before breakfast.      . fexofenadine-pseudoephedrine (ALLEGRA-D 24) 180-240 MG per 24 hr tablet Take 1  tablet by mouth daily.      . fluticasone (FLONASE) 50 MCG/ACT nasal spray       . ibuprofen (ADVIL,MOTRIN) 800 MG tablet Take 800 mg by mouth every 8 (eight) hours as needed. For pain.      Marland Kitchen lisinopril-hydrochlorothiazide (PRINZIDE,ZESTORETIC) 10-12.5 MG per tablet Take 1 tablet by mouth daily.      . methocarbamol (ROBAXIN) 750 MG tablet Take 750 mg by mouth daily as needed. For muscle spasms      . montelukast (SINGULAIR) 10 MG tablet Take 10 mg by mouth at bedtime.       . nabumetone (RELAFEN) 750 MG tablet Take 750 mg by mouth as needed. For muscle spasms      . rizatriptan (MAXALT) 10 MG tablet Take 10 mg by mouth 2 (two) times daily as needed. May repeat in 2 hours if needed for migraine      . traMADol (ULTRAM) 50 MG tablet Take 50 mg by mouth every 8 (eight) hours as needed. For pain.      Marland Kitchen zolpidem (AMBIEN) 10 MG tablet Take 10 mg by mouth at bedtime.         Allergies  Allergen Reactions  . Desloratadine Other (See Comments)    CLARINEX-"severe headache"  . Loratadine  Other (See Comments)    "severe headache"  . Telithromycin   . Tussionex Pennkinetic Er Itching  . Levbid (Hyoscyamine Sulfate) Rash    History   Social History  . Marital Status: Married    Spouse Name: N/A    Number of Children: 0  . Years of Education: N/A   Occupational History  . [employer] Paediatric nurse)    Social History Main Topics  . Smoking status: Never Smoker   . Smokeless tobacco: Not on file  . Alcohol Use: 1.5 oz/week    3 drink(s) per week  . Drug Use: No  . Sexually Active: Not on file   Other Topics Concern  . Not on file   Social History Narrative  . No narrative on file    Family History  Problem Relation Age of Onset  . Hypertension    . Heart attack    . Cancer    . Breast cancer    . Hyperlipidemia    . Coronary artery disease Father 67    stents, CABG  . Heart attack Father   . Coronary artery disease Paternal Grandfather   . Heart attack  Maternal Grandfather     Review of Systems:  As stated in the HPI and otherwise negative.   BP 124/70  Pulse 80  Ht 6' (1.829 m)  Wt 237 lb (107.502 kg)  BMI 32.14 kg/m2  Physical Examination: General: Well developed, well nourished, NAD HEENT: OP clear, mucus membranes moist SKIN: warm, dry. No rashes. Neuro: No focal deficits Musculoskeletal: Muscle strength 5/5 all ext Psychiatric: Mood and affect normal Neck: No JVD, no carotid bruits, no thyromegaly, no lymphadenopathy. Lungs:Clear bilaterally, no wheezes, rhonci, crackles Cardiovascular: Regular rate and rhythm. No murmurs, gallops or rubs. Abdomen:Soft. Bowel sounds present. Non-tender.  Extremities: No lower extremity edema. Pulses are 2 + in the bilateral DP/PT.  EKG: 02/16/12: NSR, no delta waves  Coronary CTA: 02/15/12:   IMPRESSION: 1. No evidence of significant coronary artery disease. Patient's coronary artery calcium score is zero. 2. 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 A short portion of the mid LAD extends into an intracavitary position within the right ventricle. This finding is benign, and typically of no clinical significance. One caveat is if the patient should ever require pacemaker lead placement within the right ventricle, the position of the marked intracavitary portion of the mid LAD should be noted. 3. No acute findings in the visualized thorax to account for the patient's symptoms. 4. Right coronary artery dominance.  Nuclear Stress Test: 04/28/09: Stress Procedure   The patient exercised for eight minutes.  The patient stopped due to fatigue and denied any chest pain.  He did c/o chest pressure prior to starting exercise, 5/10, but none at the time he started exercise.  There were more diffuse nonspecific T wave changes with stress and into recovery.  Myoview was injected at peak exercise and  myocardial perfusion imaging was performed after a brief delay.  QPS  Raw Data Images:  Normal; no motion artifact; normal heart/lung ratio. Stress Images:  There is normal uptake in all areas. Rest Images:  Normal homogeneous uptake in all areas of the myocardium. Subtraction (SDS):  There is no evidence of scar or ischemia. Transient Ischemic Dilatation:  .90  (Normal <1.22)  Lung/Heart Ratio:  .35  (Normal <0.45)  Quantitative Gated Spect Images  QGS EDV:  97 ml QGS ESV:  42 ml QGS EF:  56 %   Overall Impression   Exercise Capacity: Good exercise capacity. BP Response: Normal blood pressure response. Clinical Symptoms: No chest pain, stopped due to fatigue.  ECG Impression: Insignificant upsloping ST segment depression. Overall Impression: Normal stress nuclear study.

## 2012-02-21 NOTE — Assessment & Plan Note (Signed)
He has episodes of post exercise residual tachycardia or at least awareness of this. He also has palpitations. Will arrange 21 day event monitor. He also has very odd neurological symptoms. Will get echo to exclude structural heart disease and assess LVEF.

## 2012-02-21 NOTE — Assessment & Plan Note (Signed)
He has atypical chest pain. Recent coronary CTA last week with no evidence of CAD. No further ischemic workup.

## 2012-02-21 NOTE — Patient Instructions (Signed)
Your physician recommends that you schedule a follow-up appointment in 4 weeks or as soon as the 21 day event monitor is completed.  Your physician has requested that you have an echocardiogram. Echocardiography is a painless test that uses sound waves to create images of your heart. It provides your doctor with information about the size and shape of your heart and how well your heart's chambers and valves are working. This procedure takes approximately one hour. There are no restrictions for this procedure.  Your physician has recommended that you wear a 21 day event monitor. Event monitors are medical devices that record the heart's electrical activity. Doctors most often [place] these monitors to diagnose arrhythmias. Arrhythmias are problems with the speed or rhythm of the heartbeat. The monitor is a small, portable device. You can wear one while you do your normal daily activities. This is usually used to diagnose what is causing palpitations/syncope (passing out).

## 2012-02-25 ENCOUNTER — Telehealth: Payer: Self-pay | Admitting: Cardiology

## 2012-02-25 NOTE — Telephone Encounter (Signed)
New Problem:      Patient, who is actually a Dr. Clifton James patient, returned your phone call. Please call back.

## 2012-02-25 NOTE — Telephone Encounter (Signed)
Spoke with pt about recent echo results. 

## 2012-03-04 ENCOUNTER — Ambulatory Visit: Payer: Self-pay | Admitting: Cardiovascular Disease

## 2012-03-10 ENCOUNTER — Ambulatory Visit (INDEPENDENT_AMBULATORY_CARE_PROVIDER_SITE_OTHER): Payer: BC Managed Care – PPO | Admitting: Physician Assistant

## 2012-03-10 VITALS — BP 111/75 | HR 80 | Temp 98.0°F | Resp 18 | Ht 70.5 in | Wt 236.0 lb

## 2012-03-10 DIAGNOSIS — J069 Acute upper respiratory infection, unspecified: Secondary | ICD-10-CM

## 2012-03-10 MED ORDER — IPRATROPIUM BROMIDE 0.06 % NA SOLN: 2.0000 | Freq: Four times a day (QID) | NASAL | Status: DC

## 2012-03-10 MED ORDER — AMOXICILLIN 875 MG PO TABS: 875.0000 mg | ORAL_TABLET | Freq: Two times a day (BID) | ORAL | Status: AC

## 2012-03-10 MED ORDER — PROMETHAZINE-DM 6.25-15 MG/5ML PO SYRP: 5.0000 mL | ORAL_SOLUTION | Freq: Every day | ORAL | Status: AC

## 2012-03-10 MED ORDER — FLUTICASONE PROPIONATE 50 MCG/ACT NA SUSP: 2.0000 | Freq: Every day | NASAL | Status: DC

## 2012-03-10 NOTE — Progress Notes (Signed)
Subjective:    Patient ID: Randall Reed, male    DOB: [birth year], 41 y.o.   MRN: [id number]  HPI Randall Reed comes in today stating he has a sinus infection.  He gets these 2 times a year and always needs an antibiotic.  States he knows how this goes and how his body reacts to these infections.  Head congestion and sinus pressure started 3 days ago.  Last night, ears were full and painful and started to get a scratchy throat.  This morning has a cough.  He is flying to [facility] on Friday and wants to get an antibiotic before he leaves.  He is using Allegra and Mucinex D as of now.  No fever or chills.  No myalgias.  No N/V.  Past Medical History  Diagnosis Date  . GERD (gastroesophageal reflux disease)   . Hypertension   . Anxiety   . Seasonal allergic reaction    Current Outpatient Prescriptions on File Prior to Visit  Medication Sig Dispense Refill  . albuterol (PROVENTIL HFA;VENTOLIN HFA) 108 (90 BASE) MCG/ACT inhaler Inhale 2 puffs into the lungs every 6 (six) hours as needed.      . ALPRAZolam (XANAX) 1 MG tablet Take 1 mg by mouth at bedtime. May take 2 additional doses if needed for anxiety      . cyclobenzaprine (FLEXERIL) 10 MG tablet Take 10 mg by mouth as needed. Muscle spasms/teething grinding      . diclofenac (VOLTAREN) 75 MG EC tablet Take 75 mg by mouth daily as needed. For pain      . esomeprazole (NEXIUM) 40 MG capsule Take 40 mg by mouth daily before breakfast.      . fexofenadine-pseudoephedrine (ALLEGRA-D 24) 180-240 MG per 24 hr tablet Take 1 tablet by mouth daily.      Marland Kitchen ibuprofen (ADVIL,MOTRIN) 800 MG tablet Take 800 mg by mouth every 8 (eight) hours as needed. For pain.      . methocarbamol (ROBAXIN) 750 MG tablet Take 750 mg by mouth daily as needed. For muscle spasms      . montelukast (SINGULAIR) 10 MG tablet Take 10 mg by mouth at bedtime.       . nabumetone (RELAFEN) 750 MG tablet Take 750 mg by mouth as needed. For muscle spasms      . rizatriptan (MAXALT) 10 MG  tablet Take 10 mg by mouth 2 (two) times daily as needed. May repeat in 2 hours if needed for migraine      . traMADol (ULTRAM) 50 MG tablet Take 50 mg by mouth every 8 (eight) hours as needed. For pain.      Marland Kitchen zolpidem (AMBIEN) 10 MG tablet Take 10 mg by mouth at bedtime.       Marland Kitchen DISCONTD: fluticasone (FLONASE) 50 MCG/ACT nasal spray       . ipratropium (ATROVENT) 0.06 % nasal spray Place 2 sprays into the nose 4 (four) times daily.  15 mL  0  . lisinopril-hydrochlorothiazide (PRINZIDE,ZESTORETIC) 10-12.5 MG per tablet Take 1 tablet by mouth daily.         Review of Systems As above    Objective:   Physical Exam  Constitutional: He appears well-developed and well-nourished.  HENT:  Right Ear: Tympanic membrane normal.  Left Ear: Tympanic membrane normal.  Nose: Mucosal edema and rhinorrhea present.  Mouth/Throat: Posterior oropharyngeal erythema present.  Cardiovascular: Normal rate and regular rhythm.   Pulmonary/Chest: Effort normal and breath sounds normal.  Lymphadenopathy:  He has no cervical adenopathy.         Assessment & Plan:  Viral URI likely but will treat for sinusitis as he is traveling.  Amox, Atrovent, Phenergan DM.  Reviewed viral and bacterial infections at length,  Recommend he allow symptomatic treatment time to work and hold antibiotic.

## 2012-03-21 ENCOUNTER — Other Ambulatory Visit: Payer: Self-pay | Admitting: Physician Assistant

## 2012-03-24 ENCOUNTER — Encounter: Payer: Self-pay | Admitting: Cardiovascular Disease

## 2012-03-24 ENCOUNTER — Ambulatory Visit (INDEPENDENT_AMBULATORY_CARE_PROVIDER_SITE_OTHER): Payer: BC Managed Care – PPO | Admitting: Cardiovascular Disease

## 2012-03-24 VITALS — BP 130/82 | HR 90 | Resp 18 | Ht 72.0 in | Wt 230.8 lb

## 2012-03-24 DIAGNOSIS — I10 Essential (primary) hypertension: Secondary | ICD-10-CM

## 2012-03-24 DIAGNOSIS — R002 Palpitations: Secondary | ICD-10-CM

## 2012-03-24 DIAGNOSIS — R079 Chest pain, unspecified: Secondary | ICD-10-CM

## 2012-03-24 MED ORDER — LISINOPRIL 5 MG PO TABS: 5.0000 mg | ORAL_TABLET | Freq: Every day | ORAL | Status: DC

## 2012-03-24 NOTE — Assessment & Plan Note (Signed)
Most likely related to PACs. No changes at this time.

## 2012-03-24 NOTE — Assessment & Plan Note (Signed)
BP is well controlled here but readings from home show some readings in the 140/90s. Will add back Lisinopril 5 mg po Qdaily. Follow up in primary care for BP management.

## 2012-03-24 NOTE — Patient Instructions (Signed)
Your physician recommends that you schedule a follow-up appointment as needed  Your physician has recommended you make the following change in your medication: Start Lisinopril 5 mg by mouth daily

## 2012-03-24 NOTE — Assessment & Plan Note (Signed)
Atypical. Normal coronaries on CTA chest. No further workup. Non-cardiac.

## 2012-03-24 NOTE — Progress Notes (Signed)
History of Present Illness:  41 yo WM with history of HTN, GERD, anxiety who is here today for follow up. I saw him April 2013 for evaluation of palpitations and chest pain. He has had episodes of chest pain over the last few years. He was seen by Charlies Constable in June 2010 and had a normal nuclear stress study with no evidence of ischemia. He was seen in the [facility] ED on 02/14/12 with c/o left sided chest pain. Coronary CTA with no evidence of CAD. He also described some problems with memory, ability to form thoughts. Head CT with no acute findings. He told me that he first had problems on 12/15/11 when after a workout his heart rate would not decrease. It took about two hours for it to resolve. EKG was normal in primary care the next day. He does have anxiety. The week before his first visit here, he had a good workout and had increased heart rate that would not come down. He then had confusion, blurry vision, could not move. He says it took 90 minutes to get out of his chair. I ordered an echo and a 21 day event monitor. The echo showed mild LVH with normal LV function and no significant valve abnormalities. 21 day event monitor showed sinus rhythm with PACs and junctional brady while sleeping. NO SVT or atrial arrythmias.   He is here for follow up. He has been doing ok. He still reports having some funny feelings after exercise. No chest pains or SOB. He thinks his blood sugar is dropping after his workout. His BP has been creeping back up to 140/90 at home off of Lisinopril/HCTZ pill (held for dizziness and hypotension at first visit)     Primary Care Physician:  Last Lipid Profile:  Past Medical History  Diagnosis Date  . GERD (gastroesophageal reflux disease)   . Hypertension   . Anxiety   . Seasonal allergic reaction     Past Surgical History  Procedure Date  . None   . None     Current Outpatient Prescriptions  Medication Sig Dispense Refill  . albuterol (PROVENTIL  HFA;VENTOLIN HFA) 108 (90 BASE) MCG/ACT inhaler Inhale 2 puffs into the lungs every 6 (six) hours as needed.      . ALPRAZolam (XANAX) 1 MG tablet Take 1 mg by mouth at bedtime. May take 2 additional doses if needed for anxiety      . cyclobenzaprine (FLEXERIL) 10 MG tablet Take 10 mg by mouth as needed. Muscle spasms/teething grinding      . diclofenac (VOLTAREN) 75 MG EC tablet Take 75 mg by mouth daily as needed. For pain      . esomeprazole (NEXIUM) 40 MG capsule Take 40 mg by mouth daily before breakfast.      . fexofenadine-pseudoephedrine (ALLEGRA-D 24) 180-240 MG per 24 hr tablet Take 1 tablet by mouth daily.      . fluticasone (FLONASE) 50 MCG/ACT nasal spray Place 2 sprays into the nose daily.  1 g  11  . ibuprofen (ADVIL,MOTRIN) 800 MG tablet Take 800 mg by mouth every 8 (eight) hours as needed. For pain.      Marland Kitchen ipratropium (ATROVENT) 0.06 % nasal spray Place 2 sprays into the nose 4 (four) times daily.  15 mL  0  . lisinopril-hydrochlorothiazide (PRINZIDE,ZESTORETIC) 10-12.5 MG per tablet Take 1 tablet by mouth daily.      Marland Kitchen lisinopril-hydrochlorothiazide (PRINZIDE,ZESTORETIC) 10-12.5 MG per tablet TAKE 1 TABLET BY MOUTH EVERY DAY  30 tablet  0  . methocarbamol (ROBAXIN) 750 MG tablet Take 750 mg by mouth daily as needed. For muscle spasms      . montelukast (SINGULAIR) 10 MG tablet Take 10 mg by mouth at bedtime.       . nabumetone (RELAFEN) 750 MG tablet Take 750 mg by mouth as needed. For muscle spasms      . rizatriptan (MAXALT) 10 MG tablet Take 10 mg by mouth 2 (two) times daily as needed. May repeat in 2 hours if needed for migraine      . traMADol (ULTRAM) 50 MG tablet Take 50 mg by mouth every 8 (eight) hours as needed. For pain.      Marland Kitchen zolpidem (AMBIEN) 10 MG tablet Take 10 mg by mouth at bedtime.         Allergies  Allergen Reactions  . Desloratadine Other (See Comments)    CLARINEX-"severe headache"  . Hydrocod Polst-Cpm Polst Er Itching  . Loratadine Other (See  Comments)    "severe headache"  . Telithromycin   . Levbid (Hyoscyamine Sulfate) Rash    History   Social History  . Marital Status: Married    Spouse Name: N/A    Number of Children: 0  . 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 Smoking status: Never Smoker   . Smokeless tobacco: Not on file  . Alcohol Use: 1.5 oz/week    3 drink(s) per week  . Drug Use: No  . Sexually Active: Not on file   Other Topics Concern  . Not on file   Social History Narrative  . No narrative on file    Family History  Problem Relation Age of Onset  . Hypertension    . Heart attack    . Cancer    . Breast cancer    . Hyperlipidemia    . Coronary artery disease Father 81    stents, CABG  . Heart attack Father   . Coronary artery disease Paternal Grandfather   . Heart attack Maternal Grandfather     Review of Systems:  As stated in the HPI and otherwise negative.   BP 130/82  Pulse 90  Resp 18  Ht 6' (1.829 m)  Wt 230 lb 12.8 oz (104.69 kg)  BMI 31.30 kg/m2  Physical Examination: General: Well developed, well nourished, NAD HEENT: OP clear, mucus membranes moist SKIN: warm, dry. No rashes. Neuro: No focal deficits Musculoskeletal: Muscle strength 5/5 all ext Psychiatric: Mood and affect normal Neck: No JVD, no carotid bruits, no thyromegaly, no lymphadenopathy. Lungs:Clear bilaterally, no wheezes, rhonci, crackles Cardiovascular: Regular rate and rhythm. No murmurs, gallops or rubs. Abdomen:Soft. Bowel sounds present. Non-tender.  Extremities: No lower extremity edema. Pulses are 2 + in the bilateral DP/PT.  Echo 02/21/12: Left ventricle: The cavity size was normal. Wall thickness was increased in a pattern of mild LVH. The estimated ejection fraction was 60%. Wall motion was normal; there were no regional wall motion abnormalities. - Left atrium: The atrium was mildly dilated. -  Right ventricle: The cavity size was normal. Systolic function was mildly reduced.

## 2012-03-29 ENCOUNTER — Other Ambulatory Visit: Payer: Self-pay | Admitting: Family Medicine

## 2012-03-29 ENCOUNTER — Other Ambulatory Visit: Payer: Self-pay | Admitting: Physician Assistant

## 2012-04-16 ENCOUNTER — Other Ambulatory Visit: Payer: Self-pay | Admitting: Physician Assistant

## 2012-04-22 ENCOUNTER — Telehealth: Payer: Self-pay

## 2012-04-22 NOTE — Telephone Encounter (Signed)
Pt called and states is having constipation and bloating for for several days. Must worse that last day and a half States he is working with a Psychologist, educational and is on a very lean diet. OTC stuff is not working. Please call pt to advise If something can  Be called in please call to [facility] on [address]

## 2012-04-23 NOTE — Telephone Encounter (Signed)
Patient tried dulcolax and it did not work.  He did milk of magnesia last night and today and it slowed it down.  Patient eats toast and eggs and bacon for breakfast.  Yogurt and half of peanut butter sandwich for snack.  Protein and veggies rest of day.  He does eat a lot of broccoli.  Drinks around 6 to 8 glasses of water per day.  Bowel movements are so hard that he is straining and produced some blood with a couple of them.  He has a hx of irritable bowel.

## 2012-04-23 NOTE — Telephone Encounter (Signed)
What OTC stuff has he tried?  Magnesium citrate? How much? Miralax? How much? How often? Fiber containing foods? How many servings daily? Water? How many ounces each day?

## 2012-04-23 NOTE — Telephone Encounter (Signed)
Patient notified

## 2012-04-23 NOTE — Telephone Encounter (Signed)
I suggest he increase his water intake and try OTC miralax.  The usual dose is once daily, but he can start out using it twice daily, as long as he's drinking LOTS of water.

## 2012-04-23 NOTE — Telephone Encounter (Signed)
LMOM to call back

## 2012-04-29 ENCOUNTER — Telehealth: Payer: Self-pay | Admitting: Cardiovascular Disease

## 2012-04-29 NOTE — Telephone Encounter (Signed)
Agree. cdm 

## 2012-04-29 NOTE — Telephone Encounter (Signed)
Dr Clifton James patient.  I will forward this message to [facility]

## 2012-04-29 NOTE — Telephone Encounter (Signed)
Please return call to patient [phone] regarding pain in right leg, possibly varicose vein.  Patient has a 2pm flight today  And wanted to make sure he contacted Dr. Excell Seltzer just in case it was a blood clot.

## 2012-04-29 NOTE — Telephone Encounter (Signed)
Spoke with pt. He reports he has been having orthopedic issues lately and saw physical therapist yesterday. Pt states physical therapist noted tender knot in right leg and questioned blood clot or varicose vein and instructed pt to call our office.  Pt states he just boarded a plane to [place].  I told pt if there is concern about a possible blood clot he should not fly and should go to urgent care to have this evaluated. He states he went to urgent care the other day but due to a 4 hour wait he left prior to being seen.  He states he has appt at [facility] ortho on Friday. He states he will keep that appt and have them contact us if they feel he needs to be seen in our office.  Pt states he will go to ED if he has problems when out of town.

## 2012-05-10 ENCOUNTER — Other Ambulatory Visit: Payer: Self-pay | Admitting: Physician Assistant

## 2012-06-11 ENCOUNTER — Other Ambulatory Visit: Payer: Self-pay | Admitting: Internal Medicine

## 2012-07-05 ENCOUNTER — Telehealth: Payer: Self-pay | Admitting: Family Medicine

## 2012-07-05 NOTE — Telephone Encounter (Signed)
See ED staff message 02/15/12 from Dr. Bebe Shaggy.  possible abnormality on cxr - recommeded repeat cxr in few months back in April to make sure this area is stable.  Left message on VM re: this, and that I can discuss ordering this when I get back in town, or it can be ordered sooner if he is ok with this.

## 2012-07-14 ENCOUNTER — Telehealth: Payer: Self-pay

## 2012-07-14 NOTE — Telephone Encounter (Signed)
Dr.Doolittle,  Pt would like for you to send a rx for his migraines. Pt states that the last time that it was written was over a year ago. Best# [phone]

## 2012-07-15 ENCOUNTER — Other Ambulatory Visit: Payer: Self-pay | Admitting: Family Medicine

## 2012-07-15 MED ORDER — ZOLPIDEM TARTRATE 10 MG PO TABS: 10.0000 mg | ORAL_TABLET | Freq: Every evening | ORAL | Status: DC | PRN

## 2012-07-15 NOTE — Telephone Encounter (Signed)
Have called patient left message for him to call me back what med and what pharmacy?

## 2012-07-16 MED ORDER — KETOPROFEN 75 MG PO CAPS: 75.0000 mg | ORAL_CAPSULE | Freq: Three times a day (TID) | ORAL | Status: AC | PRN

## 2012-07-16 NOTE — Telephone Encounter (Signed)
Refill sent in. Needs office visit for further refill.

## 2012-07-16 NOTE — Telephone Encounter (Signed)
LMOM that rx sent in and will need OV for any further refills.  Also, under imaging notes for last cxr he will need follow up films. To please call back with any questions.

## 2012-07-16 NOTE — Telephone Encounter (Signed)
Pt called back and stated that he uses the [facility] and he needs the Rx for the med he uses when he first starts getting a migraine, Ketoprofen 75 caps. The last RF I see in chart [id number] was for #90  W/sig Take 1 cap TID prn. Transferred pt to 104 to set up appt w/Dr Merla Riches. He also stated that he is supposed to RTC for f/up CXR per Dr Paralee Cancel instr's.

## 2012-07-16 NOTE — Telephone Encounter (Signed)
LMOM to CB. 

## 2012-08-13 ENCOUNTER — Other Ambulatory Visit: Payer: Self-pay | Admitting: Internal Medicine

## 2012-08-14 ENCOUNTER — Other Ambulatory Visit: Payer: Self-pay | Admitting: *Deleted

## 2012-08-19 ENCOUNTER — Telehealth: Payer: Self-pay | Admitting: Radiology

## 2012-08-19 NOTE — Telephone Encounter (Signed)
Refill encounter , patient requests Ambien, please advise.

## 2012-08-20 MED ORDER — ZOLPIDEM TARTRATE 10 MG PO TABS: 10.0000 mg | ORAL_TABLET | Freq: Every evening | ORAL | Status: DC | PRN

## 2012-08-20 NOTE — Telephone Encounter (Signed)
Faxed

## 2012-08-20 NOTE — Telephone Encounter (Signed)
Rx printed and ready to be faxed or picked up

## 2012-08-27 ENCOUNTER — Ambulatory Visit: Payer: BC Managed Care – PPO

## 2012-08-27 ENCOUNTER — Encounter: Payer: Self-pay | Admitting: Internal Medicine

## 2012-08-27 ENCOUNTER — Ambulatory Visit (INDEPENDENT_AMBULATORY_CARE_PROVIDER_SITE_OTHER): Payer: BC Managed Care – PPO | Admitting: Internal Medicine

## 2012-08-27 VITALS — BP 102/76 | HR 78 | Temp 98.7°F | Resp 16 | Ht 70.0 in | Wt 217.0 lb

## 2012-08-27 DIAGNOSIS — R9389 Abnormal findings on diagnostic imaging of other specified body structures: Secondary | ICD-10-CM

## 2012-08-27 DIAGNOSIS — Z6833 Body mass index (BMI) 33.0-33.9, adult: Secondary | ICD-10-CM

## 2012-08-27 DIAGNOSIS — G43909 Migraine, unspecified, not intractable, without status migrainosus: Secondary | ICD-10-CM

## 2012-08-27 DIAGNOSIS — Z23 Encounter for immunization: Secondary | ICD-10-CM

## 2012-08-27 DIAGNOSIS — I10 Essential (primary) hypertension: Secondary | ICD-10-CM

## 2012-08-27 DIAGNOSIS — M25519 Pain in unspecified shoulder: Secondary | ICD-10-CM

## 2012-08-27 DIAGNOSIS — J309 Allergic rhinitis, unspecified: Secondary | ICD-10-CM

## 2012-08-27 DIAGNOSIS — N529 Male erectile dysfunction, unspecified: Secondary | ICD-10-CM

## 2012-08-27 DIAGNOSIS — J45909 Unspecified asthma, uncomplicated: Secondary | ICD-10-CM

## 2012-08-27 DIAGNOSIS — E785 Hyperlipidemia, unspecified: Secondary | ICD-10-CM

## 2012-08-27 DIAGNOSIS — K219 Gastro-esophageal reflux disease without esophagitis: Secondary | ICD-10-CM

## 2012-08-27 DIAGNOSIS — G4700 Insomnia, unspecified: Secondary | ICD-10-CM

## 2012-08-27 DIAGNOSIS — R918 Other nonspecific abnormal finding of lung field: Secondary | ICD-10-CM

## 2012-08-27 DIAGNOSIS — F411 Generalized anxiety disorder: Secondary | ICD-10-CM

## 2012-08-27 DIAGNOSIS — R002 Palpitations: Secondary | ICD-10-CM

## 2012-08-27 DIAGNOSIS — E789 Disorder of lipoprotein metabolism, unspecified: Secondary | ICD-10-CM

## 2012-08-27 LAB — CBC
HCT: 42.6 % (ref 39.0–52.0)
Hemoglobin: 15 g/dL (ref 13.0–17.0)
MCH: 29.9 pg (ref 26.0–34.0)
MCV: 84.9 fL (ref 78.0–100.0)
Platelets: 234 10*3/uL (ref 150–400)
RBC: 5.02 MIL/uL (ref 4.22–5.81)
WBC: 8.3 10*3/uL (ref 4.0–10.5)

## 2012-08-27 LAB — POCT GLYCOSYLATED HEMOGLOBIN (HGB A1C): Hemoglobin A1C: 4.9

## 2012-08-27 MED ORDER — FLUTICASONE PROPIONATE 50 MCG/ACT NA SUSP: 2.0000 | Freq: Every day | NASAL | Status: DC

## 2012-08-27 MED ORDER — RIZATRIPTAN BENZOATE 10 MG PO TABS: 10.0000 mg | ORAL_TABLET | Freq: Two times a day (BID) | ORAL | Status: DC | PRN

## 2012-08-27 MED ORDER — IPRATROPIUM BROMIDE 0.06 % NA SOLN: 2.0000 | Freq: Two times a day (BID) | NASAL | Status: DC

## 2012-08-27 MED ORDER — HYDROCORTISONE 2.5 % RE CREA: TOPICAL_CREAM | Freq: Two times a day (BID) | RECTAL | Status: DC

## 2012-08-27 MED ORDER — IPRATROPIUM BROMIDE HFA 17 MCG/ACT IN AERS: 2.0000 | INHALATION_SPRAY | Freq: Two times a day (BID) | RESPIRATORY_TRACT | Status: DC

## 2012-08-27 MED ORDER — ESOMEPRAZOLE MAGNESIUM 40 MG PO CPDR: 40.0000 mg | DELAYED_RELEASE_CAPSULE | Freq: Every day | ORAL | Status: DC

## 2012-08-27 MED ORDER — ALBUTEROL SULFATE HFA 108 (90 BASE) MCG/ACT IN AERS: 2.0000 | INHALATION_SPRAY | Freq: Four times a day (QID) | RESPIRATORY_TRACT | Status: DC | PRN

## 2012-08-27 MED ORDER — FEXOFENADINE-PSEUDOEPHED ER 180-240 MG PO TB24: 1.0000 | ORAL_TABLET | Freq: Every day | ORAL | Status: DC

## 2012-08-27 MED ORDER — ALPRAZOLAM 1 MG PO TABS: 1.0000 mg | ORAL_TABLET | Freq: Three times a day (TID) | ORAL | Status: DC | PRN

## 2012-08-27 MED ORDER — MONTELUKAST SODIUM 10 MG PO TABS: 10.0000 mg | ORAL_TABLET | Freq: Every day | ORAL | Status: DC

## 2012-08-27 MED ORDER — ZOLPIDEM TARTRATE 10 MG PO TABS: 10.0000 mg | ORAL_TABLET | Freq: Every evening | ORAL | Status: DC | PRN

## 2012-08-27 MED ORDER — MELOXICAM 15 MG PO TABS: 15.0000 mg | ORAL_TABLET | Freq: Every day | ORAL | Status: DC

## 2012-08-27 NOTE — Progress Notes (Signed)
Subjective:    Patient ID: Randall Reed, male    DOB: 1971/02/14, 41 y.o.   MRN: [id number]  CC: 41 yo W M presents for routine health maintenance exam, follow up CXR and R shoulder pain.  HPI Pt had a CXR DATE BC.  This CXR showed an area suspicious for a lung mass vs bony growth ass w/spine.  Pt request f/u CXR to determine the significance.    He has two MSK pains that started about 1.5 mths ago.   -Pt. Has pain in his R shoulder with bench press.  Pain is most significant at initiation of horizontal add. And on returning this same position.  -Pt c/o pain in his R elbow.  He noticed this while he has been lifting weights. -Ibuprofen has not been helping.  Pt also c/o decreased sexual function and is concerned about having low testosterone.    Pt c/o of 2 episodes.  Once where he felt faint and and another when he felt "out of it" while working out at the gym.  He thinks this may be related to is low blood sugar.    PMHx: Patient Active Problem List  Diagnosis  . HYPERTENSION, BENIGN  . CHEST PAIN-UNSPECIFIED  . BMI 33.0-33.9,adult  . Allergic rhinitis  . GERD (gastroesophageal reflux disease)  . Migraine  . GAD (generalized anxiety disorder)  . Insomnia  . Palpitations      Review of Systems Constipation/rectal irrit/hemorr--Recent problems Would like to try something for the itching No chest pain, shortness of breath, edema Headaches have been stable recently No wheezing    Objective:   Physical Exam General: 52 to M appears stated age with increased abdominal girth cooperatives with exam.  He appears to be progressing well with his weight loss program. Vitals:  BMI 31-Down from 33 Filed Vitals:   08/27/12 1525  BP: 102/76  Pulse: 78  Temp: 98.7 F (37.1 C)  Resp: 16  HEENT: nontraumatic, EOMIT, normal to external exam, trachea midline Heart: Regular rate Lungs: No acute respiratory distress, no audible wheezing MSK: Normal bulk and tone R UE: No visible  brusing or deformity, full ROM with catch in shoulder with passive extension. R ELBOW - tender to palpation both condyles w/ pain w/ pronat,sup Neuro: Alert, oriented, CN II - XII intact    CXR [facility] reading (PRIMARY) by  Dr. Merla Riches.still has density at spine on lateral/? Comparison to last xray ShoulderR=WNL        Assessment & Plan:  1) Follow up CXR-Will need CT of chest 2) Medial Epicondylitis/Lateral epicondylitis  3) R shoulder pain -Secondary rotator cuff tendinitis 4) Routine Health Maintenance - 5) Decreased sexual drive? With some change in erectile function 6) Htn 7) anxiety 8) insomnia 9) AR w/ RAD 10) GERD 11) Migraines 12) hx Hyperglycemia--check A1C  Meds ordered this encounter  Medications  . ketoprofen (ORUDIS) 75 MG capsule    Sig: Take 75 mg by mouth 4 (four) times daily as needed.  . fexofenadine-pseudoephedrine (ALLEGRA-D 24) 180-240 MG per 24 hr tablet    Sig: Take 1 tablet by mouth daily.    Dispense:  30 tablet    Refill:  11  . ALPRAZolam (XANAX) 1 MG tablet    Sig: Take 1 tablet (1 mg total) by mouth 3 (three) times daily as needed for sleep.    Dispense:  90 tablet    Refill:  5  . ipratropium (ATROVENT HFA) 17 MCG/ACT inhaler    Sig: Inhale  2 puffs into the lungs 2 (two) times daily.    Dispense:  12.9 g    Refill:  1  . albuterol (PROVENTIL HFA;VENTOLIN HFA) 108 (90 BASE) MCG/ACT inhaler    Sig: Inhale 2 puffs into the lungs every 6 (six) hours as needed.    Dispense:  1 Inhaler    Refill:  5  . esomeprazole (NEXIUM) 40 MG capsule    Sig: Take 1 capsule (40 mg total) by mouth daily before breakfast.    Dispense:  90 capsule    Refill:  3  . fluticasone (FLONASE) 50 MCG/ACT nasal spray    Sig: Place 2 sprays into the nose daily.    Dispense:  1 g    Refill:  11  . ipratropium (ATROVENT) 0.06 % nasal spray    Sig: Place 2 sprays into the nose 2 (two) times daily.    Dispense:  15 mL    Refill:  11  . montelukast (SINGULAIR) 10 MG  tablet    Sig: Take 1 tablet (10 mg total) by mouth at bedtime.    Dispense:  90 tablet    Refill:  0  . rizatriptan (MAXALT) 10 MG tablet    Sig: Take 1 tablet (10 mg total) by mouth 2 (two) times daily as needed. May repeat in 2 hours if needed for migraine    Dispense:  10 tablet    Refill:  11  . zolpidem (AMBIEN) 10 MG tablet    Sig: Take 1 tablet (10 mg total) by mouth at bedtime as needed for sleep.    Dispense:  30 tablet    Refill:  0  . hydrocortisone (ANUSOL-HC) 2.5 % rectal cream    Sig: Place rectally 2 (two) times daily.    Dispense:  30 g    Refill:  3  . meloxicam (MOBIC) 15 MG tablet    Sig: Take 1 tablet (15 mg total) by mouth daily.    Dispense:  30 tablet    Refill:  1     Addendum 10/27 / 13-labs: Results for orders placed in visit on 08/27/12  CBC      Component Value Range   WBC 8.3  4.0 - 10.5 K/uL   RBC 5.02  4.22 - 5.81 MIL/uL   Hemoglobin 15.0  13.0 - 17.0 g/dL   HCT 16.1  09.6 - 04.5 %   MCV 84.9  78.0 - 100.0 fL   MCH 29.9  26.0 - 34.0 pg   MCHC 35.2  30.0 - 36.0 g/dL   RDW 40.9  81.1 - 91.4 %   Platelets 234  150 - 400 K/uL  COMPREHENSIVE METABOLIC PANEL      Component Value Range   Sodium 136  135 - 145 mEq/L   Potassium 4.5  3.5 - 5.3 mEq/L   Chloride 103  96 - 112 mEq/L   CO2 26  19 - 32 mEq/L   Glucose, Bld 86  70 - 99 mg/dL   BUN 18  6 - 23 mg/dL   Creat 7.82  9.56 - 2.13 mg/dL   Total Bilirubin 0.5  0.3 - 1.2 mg/dL   Alkaline Phosphatase 74  39 - 117 U/L   AST 21  0 - 37 U/L   ALT 18  0 - 53 U/L   Total Protein 6.8  6.0 - 8.3 g/dL   Albumin 4.5  3.5 - 5.2 g/dL   Calcium 9.3  8.4 - 08.6 mg/dL  LIPID PANEL  Component Value Range   Cholesterol 151  0 - 200 mg/dL   Triglycerides 161  <096 mg/dL   HDL 44  >04 mg/dL   Total CHOL/HDL Ratio 3.4     VLDL 21  0 - 40 mg/dL   LDL Cholesterol 86  0 - 99 mg/dL  POCT GLYCOSYLATED HEMOGLOBIN (HGB A1C)      Component Value Range   Hemoglobin A1C 4.9    TESTOSTERONE, FREE, TOTAL       Component Value Range   Testosterone 293.26 (*) 300 - 890 ng/dL   Sex Hormone Binding 21  13 - 71 nmol/L   Testosterone, Free 72.9  47.0 - 244.0 pg/mL   Testosterone-% Freee. 2.5  1.6 - 2.9 %   We'll send a note to him that labs all good/this testosterone level was too close to normal to suggest need for treatment

## 2012-08-27 NOTE — Progress Notes (Signed)
I spoke to patient after his office visit concerning his abnormal Chest Xray, advised will proceed with a CT scan per Radiologist recommendations, order generated.

## 2012-08-28 LAB — COMPREHENSIVE METABOLIC PANEL
ALT: 18 U/L (ref 0–53)
BUN: 18 mg/dL (ref 6–23)
CO2: 26 mEq/L (ref 19–32)
Calcium: 9.3 mg/dL (ref 8.4–10.5)
Chloride: 103 mEq/L (ref 96–112)
Creat: 1.29 mg/dL (ref 0.50–1.35)
Glucose, Bld: 86 mg/dL (ref 70–99)
Total Bilirubin: 0.5 mg/dL (ref 0.3–1.2)

## 2012-08-28 LAB — LIPID PANEL
Cholesterol: 151 mg/dL (ref 0–200)
Total CHOL/HDL Ratio: 3.4 Ratio
Triglycerides: 107 mg/dL (ref <150)
VLDL: 21 mg/dL (ref 0–40)

## 2012-08-28 LAB — TESTOSTERONE, FREE, TOTAL, SHBG
Sex Hormone Binding: 21 nmol/L (ref 13–71)
Testosterone-% Free: 2.5 % (ref 1.6–2.9)
Testosterone: 293.26 ng/dL — ABNORMAL LOW (ref 300–890)

## 2012-09-01 ENCOUNTER — Encounter: Payer: Self-pay | Admitting: Internal Medicine

## 2012-09-02 ENCOUNTER — Ambulatory Visit
Admission: RE | Admit: 2012-09-02 | Discharge: 2012-09-02 | Disposition: A | Discharge status: Home / Self Care | Payer: BC Managed Care – PPO | Source: Ambulatory Visit | Attending: Internal Medicine | Admitting: Internal Medicine

## 2012-09-02 DIAGNOSIS — R9389 Abnormal findings on diagnostic imaging of other specified body structures: Secondary | ICD-10-CM

## 2012-09-02 MED ORDER — IOHEXOL 300 MG/ML  SOLN
75.0000 mL | Freq: Once | INTRAMUSCULAR | Status: AC | PRN
  Administered 2012-09-02: 75 mL via INTRAVENOUS

## 2012-09-08 ENCOUNTER — Telehealth: Payer: Self-pay

## 2012-09-08 NOTE — Telephone Encounter (Signed)
Pt states he sees doolittle and has a sinus infection twice a year that ends up in a respiratory infection if left untreated. Pt states he has two meetings back to back this morning and then gets on a plane at 2pm to go to [place] for business. States he has taken mucinex to try and help but asks for the same rx dr Merla Riches usually gives him to help. Asks for a call to find out which pharmacy would be local to him where he is traveling.  Best: [phone]  bf

## 2012-09-09 ENCOUNTER — Telehealth: Payer: Self-pay

## 2012-09-09 MED ORDER — FLUTICASONE PROPIONATE 50 MCG/ACT NA SUSP: 2.0000 | Freq: Every day | NASAL | Status: DC

## 2012-09-09 NOTE — Telephone Encounter (Signed)
He had messaged Brunetta Genera about this, I have advised her to let him know we have taken care of this, if he has questions, he is advised to call back and ask for me.

## 2012-09-09 NOTE — Telephone Encounter (Signed)
Patient called back with information for pharmacy, please advise what we an offer him, if anything. Pharmacy in [facility] saved in computer.

## 2012-09-09 NOTE — Telephone Encounter (Signed)
Will refill Flonase so he can continue this while he is on business, but I do not believe it is appropriate to send an antibiotic without being evaluated first. He should continue albuterol, singulair, and allegra

## 2012-09-09 NOTE — Telephone Encounter (Signed)
Pt wants Merla Riches to review message from 09-09-12.  Says he has his yearly sinus infection and would like an antibiotic called in for him.  His nose spray was refilled, but no antibiotic was called in due to him needing to be seen for the possible infection.  He requests that Merla Riches only review this.  [phone]

## 2012-09-09 NOTE — Telephone Encounter (Signed)
Patient states he gets URI, is in [facility]. States he has productive cough. Has been taking Mucinex DM, has worsened yesterday, wants to know if there is anything else we can recommend for him. He will call me back with the pharmacy information.

## 2012-09-10 ENCOUNTER — Telehealth: Payer: Self-pay | Admitting: Radiology

## 2012-09-10 MED ORDER — AMOXICILLIN 500 MG PO CAPS: 1000.0000 mg | ORAL_CAPSULE | Freq: Two times a day (BID) | ORAL | Status: DC

## 2012-09-10 MED ORDER — AMOXICILLIN 500 MG PO CAPS: 1000.0000 mg | ORAL_CAPSULE | Freq: Two times a day (BID) | ORAL | Status: AC

## 2012-09-10 MED ORDER — AMOXICILLIN 500 MG PO CAPS
1000.0000 mg | ORAL_CAPSULE | Freq: Two times a day (BID) | ORAL | Status: DC
Start: 2012-09-10 — End: 2012-09-10

## 2012-09-10 NOTE — Telephone Encounter (Signed)
Spoke to patient again, his Rx for Amox did not go through, it is resubmitted.

## 2012-09-10 NOTE — Telephone Encounter (Signed)
May phone in amoxicillin as indicated/? What pharmacy Meds ordered this encounter  Medications  . amoxicillin (AMOXIL) 500 MG capsule    Sig: Take 2 capsules (1,000 mg total) by mouth 2 (two) times daily.    Dispense:  40 capsule    Refill:  0

## 2012-09-10 NOTE — Telephone Encounter (Signed)
Spoke with pt, he is in [place], the number is [phone] He also would like a cough medicine. He is trying mucinex DM and it not working. Please advise. I changed his pharmacy to [facility] in [facility] and resent his Amoxicillin.

## 2012-09-10 NOTE — Telephone Encounter (Signed)
May ask Pharmacist for the strongest one they carry w/out an RX///may have robit-ac

## 2012-09-10 NOTE — Telephone Encounter (Signed)
Left message for patient to notify him rx sent in for amoxil and also speak with pharmacist for the strongest one they carry or see if they have robit-ac. Any questions give [place] a call.

## 2012-09-10 NOTE — Telephone Encounter (Signed)
Okay to call in amoxicillin-see note

## 2012-10-02 ENCOUNTER — Ambulatory Visit (INDEPENDENT_AMBULATORY_CARE_PROVIDER_SITE_OTHER): Payer: BC Managed Care – PPO | Admitting: Emergency Medicine

## 2012-10-02 VITALS — BP 112/74 | HR 90 | Temp 98.1°F | Resp 18 | Ht 70.5 in | Wt 218.0 lb

## 2012-10-02 DIAGNOSIS — M7550 Bursitis of unspecified shoulder: Secondary | ICD-10-CM

## 2012-10-02 DIAGNOSIS — I10 Essential (primary) hypertension: Secondary | ICD-10-CM

## 2012-10-02 DIAGNOSIS — M719 Bursopathy, unspecified: Secondary | ICD-10-CM

## 2012-10-02 DIAGNOSIS — N529 Male erectile dysfunction, unspecified: Secondary | ICD-10-CM

## 2012-10-02 DIAGNOSIS — M67919 Unspecified disorder of synovium and tendon, unspecified shoulder: Secondary | ICD-10-CM

## 2012-10-02 MED ORDER — PREDNISONE 10 MG PO TABS: ORAL_TABLET | ORAL | Status: DC

## 2012-10-02 MED ORDER — TADALAFIL 20 MG PO TABS: ORAL_TABLET | ORAL | Status: DC

## 2012-10-02 NOTE — Patient Instructions (Addendum)
.   Her blood pressure medication. Please take your blood pressure monitor to the pharmacy to see if it is accurate.

## 2012-10-02 NOTE — Progress Notes (Signed)
  Subjective:    Patient ID: Randall Reed, male    DOB: Feb 05, 1971, 41 y.o.   MRN: [id number]  HPI Blood pressure has been running a little high. 138/87 was reading this morning. Pt states after he reaches130/90 or higher, he begins to have a headache. He is taking his medication as prescribed.  Experiencing right shoulder pain history of impingement syndrome. He's been taking meloxicam without improvement. Prior to taking this he took Relafen .   Review of Systems he also is experiencing ED issues he has taken Cialis in the past but has been better recently but is in today to see about renewing this prescription to     Objective:   Physical Exam pupils equal and reactive to light. He has prominent optic cups bilaterally. Disc margins are sharp. The neck is supple. Chest is clear to auscultation and percussion. Cardiac exam is unremarkable. There is tenderness over the subdeltoid area with pain on abduction of the right shoulder.        Assessment & Plan:

## 2012-10-13 ENCOUNTER — Ambulatory Visit (INDEPENDENT_AMBULATORY_CARE_PROVIDER_SITE_OTHER): Payer: BC Managed Care – PPO | Admitting: Internal Medicine

## 2012-10-13 VITALS — BP 131/80 | HR 77 | Temp 98.1°F | Resp 16 | Ht 70.5 in | Wt 221.2 lb

## 2012-10-13 DIAGNOSIS — R197 Diarrhea, unspecified: Secondary | ICD-10-CM

## 2012-10-13 DIAGNOSIS — R1013 Epigastric pain: Secondary | ICD-10-CM

## 2012-10-13 DIAGNOSIS — K3189 Other diseases of stomach and duodenum: Secondary | ICD-10-CM

## 2012-10-13 DIAGNOSIS — R109 Unspecified abdominal pain: Secondary | ICD-10-CM

## 2012-10-13 LAB — POCT CBC
Granulocyte percent: 63.7 %G (ref 37–80)
HCT, POC: 47.6 % (ref 43.5–53.7)
MID (cbc): 0.7 (ref 0–0.9)
POC Granulocyte: 6.6 (ref 2–6.9)
POC LYMPH PERCENT: 29.9 %L (ref 10–50)
Platelet Count, POC: 271 10*3/uL (ref 142–424)
RBC: 5.17 M/uL (ref 4.69–6.13)
RDW, POC: 13.8 %

## 2012-10-13 MED ORDER — CIPROFLOXACIN HCL 500 MG PO TABS: 500.0000 mg | ORAL_TABLET | Freq: Two times a day (BID) | ORAL | Status: DC

## 2012-10-13 MED ORDER — CILIDINIUM-CHLORDIAZEPOXIDE 2.5-5 MG PO CAPS: 1.0000 | ORAL_CAPSULE | Freq: Three times a day (TID) | ORAL | Status: DC | PRN

## 2012-10-13 NOTE — Progress Notes (Signed)
  Subjective:    Patient ID: Randall Reed, male    DOB: Feb 17, 1971, 41 y.o.   MRN: [id number]  HPIluq pain tues during trip to [place] wed then diarrhea at midnite until Friday am gasy Friday([place]gasex Diarrhea sat again--immodium Sun epigastr pain/feels uneasy Gas today---soft stools   4lbs gain since OV 11/28-feels bloated Review of Systems No fever chills and night sweats No blood in the bowel movements No urinary symptoms    Objective:   Physical Exam Vital signs stable Heart regular Lungs clear Abdomen with increased bowel sounds in all quadrants Generally tender over the left upper quadrant and left lower quadrants suprapubic area without rebound or guarding   Results for orders placed in visit on 10/13/12  POCT CBC      Component Value Range   WBC 10.3 (*) 4.6 - 10.2 K/uL   Lymph, poc 3.1  0.6 - 3.4   POC LYMPH PERCENT 29.9  10 - 50 %L   MID (cbc) 0.7  0 - 0.9   POC MID % 6.4  0 - 12 %M   POC Granulocyte 6.6  2 - 6.9   Granulocyte percent 63.7  37 - 80 %G   RBC 5.17  4.69 - 6.13 M/uL   Hemoglobin 15.4  14.1 - 18.1 g/dL   HCT, POC 40.9  81.1 - 53.7 %   MCV 92.0  80 - 97 fL   MCH, POC 29.8  27 - 31.2 pg   MCHC 32.4  31.8 - 35.4 g/dL   RDW, POC 91.4     Platelet Count, POC 271  142 - 424 K/uL   MPV 8.1  0 - 99.8 fL       Assessment & Plan:  Problem #1 abdominal pain Problem #2 diarrhea after travel Problem #3 dyspepsia  ? Travel related  food borne infection Meds ordered this encounter  Medications  . ciprofloxacin (CIPRO) 500 MG tablet    Sig: Take 1 tablet (500 mg total) by mouth 2 (two) times daily.    Dispense:  10 tablet    Refill:  0  . clidinium-chlordiazePOXIDE (LIBRAX) 2.5-5 MG per capsule    Sig: Take 1 capsule by mouth 3 (three) times daily as needed.    Dispense:  15 capsule    Refill:  0   If not well by the weekend to followup for stool evaluation

## 2012-10-14 LAB — COMPREHENSIVE METABOLIC PANEL
AST: 15 U/L (ref 0–37)
Albumin: 4.3 g/dL (ref 3.5–5.2)
Alkaline Phosphatase: 80 U/L (ref 39–117)
Chloride: 102 mEq/L (ref 96–112)
Potassium: 4 mEq/L (ref 3.5–5.3)
Sodium: 138 mEq/L (ref 135–145)
Total Protein: 6.3 g/dL (ref 6.0–8.3)

## 2012-10-24 ENCOUNTER — Other Ambulatory Visit: Payer: Self-pay | Admitting: Internal Medicine

## 2012-10-27 ENCOUNTER — Telehealth: Payer: Self-pay

## 2012-10-27 MED ORDER — CILIDINIUM-CHLORDIAZEPOXIDE 2.5-5 MG PO CAPS: 1.0000 | ORAL_CAPSULE | Freq: Three times a day (TID) | ORAL | Status: DC | PRN

## 2012-10-27 NOTE — Telephone Encounter (Addendum)
Pt states that he just finished taking his medication that he was recently perscribed, pt would like to have a refill on the medication that was prescribed for IBS. Pt states that he does not have diarrhea nor nausea at this time but does complain of having "rectal spasms" .  Best# [phone] [facility]

## 2012-10-27 NOTE — Telephone Encounter (Signed)
Spoke with patient and he states its abd cramping and feels like muscle spasms. When was on Librax was not having these sxs but since he has been off for a couple days its starting back again. Feels like IBS sxs he usually has.

## 2012-10-27 NOTE — Telephone Encounter (Signed)
I have reordered the Librax for him.  When this runs out, he will need to follow up with Dr. Merla Riches

## 2012-10-28 NOTE — Telephone Encounter (Signed)
lmom that rx was sent to pharmacy and that he needs to follow up

## 2012-11-07 DIAGNOSIS — M25511 Pain in right shoulder: Secondary | ICD-10-CM | POA: Insufficient documentation

## 2012-11-07 DIAGNOSIS — M25561 Pain in right knee: Secondary | ICD-10-CM | POA: Insufficient documentation

## 2012-11-15 ENCOUNTER — Ambulatory Visit (INDEPENDENT_AMBULATORY_CARE_PROVIDER_SITE_OTHER): Payer: BC Managed Care – PPO | Admitting: Family Medicine

## 2012-11-15 VITALS — BP 108/73 | HR 93 | Temp 97.8°F | Resp 16 | Ht 70.5 in | Wt 222.0 lb

## 2012-11-15 DIAGNOSIS — K589 Irritable bowel syndrome without diarrhea: Secondary | ICD-10-CM

## 2012-11-15 DIAGNOSIS — R059 Cough, unspecified: Secondary | ICD-10-CM

## 2012-11-15 DIAGNOSIS — R05 Cough: Secondary | ICD-10-CM

## 2012-11-15 DIAGNOSIS — J40 Bronchitis, not specified as acute or chronic: Secondary | ICD-10-CM

## 2012-11-15 DIAGNOSIS — H6690 Otitis media, unspecified, unspecified ear: Secondary | ICD-10-CM

## 2012-11-15 MED ORDER — HYDROCODONE-HOMATROPINE 5-1.5 MG/5ML PO SYRP: 5.0000 mL | ORAL_SOLUTION | Freq: Three times a day (TID) | ORAL | Status: DC | PRN

## 2012-11-15 MED ORDER — CILIDINIUM-CHLORDIAZEPOXIDE 2.5-5 MG PO CAPS: 1.0000 | ORAL_CAPSULE | Freq: Two times a day (BID) | ORAL | Status: DC

## 2012-11-15 MED ORDER — LEVOFLOXACIN 500 MG PO TABS: 500.0000 mg | ORAL_TABLET | Freq: Every day | ORAL | Status: DC

## 2012-11-15 NOTE — Patient Instructions (Addendum)
Irritable Bowel Syndrome  Irritable Bowel Syndrome (IBS) is caused by a disturbance of normal bowel function. Other terms used are spastic colon, mucous colitis, and irritable colon. It does not require surgery, nor does it lead to cancer. There is no cure for IBS. But with proper diet, stress reduction, and medication, you will find that your problems (symptoms) will gradually disappear or improve. IBS is a common digestive disorder. It usually appears in late adolescence or early adulthood. Women develop it twice as often as men.  CAUSES   After food has been digested and absorbed in the small intestine, waste material is moved into the colon (large intestine). In the colon, water and salts are absorbed from the undigested products coming from the small intestine. The remaining residue, or fecal material, is held for elimination. Under normal circumstances, gentle, rhythmic contractions on the bowel walls push the fecal material along the colon towards the rectum. In IBS, however, these contractions are irregular and poorly coordinated. The fecal material is either retained too long, resulting in constipation, or expelled too soon, producing diarrhea.  SYMPTOMS   The most common symptom of IBS is pain. It is typically in the lower left side of the belly (abdomen). But it may occur anywhere in the abdomen. It can be felt as heartburn, backache, or even as a dull pain in the arms or shoulders. The pain comes from excessive bowel-muscle spasms and from the buildup of gas and fecal material in the colon. This pain:   Can range from sharp belly (abdominal) cramps to a dull, continuous ache.   Usually worsens soon after eating.   Is typically relieved by having a bowel movement or passing gas.  Abdominal pain is usually accompanied by constipation. But it may also produce diarrhea. The diarrhea typically occurs right after a meal or upon arising in the morning. The stools are typically soft and watery. They are often  flecked with secretions (mucus).  Other symptoms of IBS include:   Bloating.   Loss of appetite.   Heartburn.   Feeling sick to your stomach (nausea).   Belching   Vomiting   Gas.  IBS may also cause a number of symptoms that are unrelated to the digestive system:   Fatigue.   Headaches.   Anxiety   Shortness of breath   Difficulty in concentrating.   Dizziness.  These symptoms tend to come and go.  DIAGNOSIS   The symptoms of IBS closely mimic the symptoms of other, more serious digestive disorders. So your caregiver may wish to perform a variety of additional tests to exclude these disorders. He/she wants to be certain of learning what is wrong (diagnosis). The nature and purpose of each test will be explained to you.  TREATMENT  A number of medications are available to help correct bowel function and/or relieve bowel spasms and abdominal pain. Among the drugs available are:   Mild, non-irritating laxatives for severe constipation and to help restore normal bowel habits.   Specific anti-diarrheal medications to treat severe or prolonged diarrhea.   Anti-spasmodic agents to relieve intestinal cramps.   Your caregiver may also decide to treat you with a mild tranquilizer or sedative during unusually stressful periods in your life.  The important thing to remember is that if any drug is prescribed for you, make sure that you take it exactly as directed. Make sure that your caregiver knows how well it worked for you.  HOME CARE INSTRUCTIONS    Avoid foods that   are high in fat or oils. Some examples are:heavy cream, butter, frankfurters, sausage, and other fatty meats.   Avoid foods that have a laxative effect, such as fruit, fruit juice, and dairy products.   Cut out carbonated drinks, chewing gum, and "gassy" foods, such as beans and cabbage. This may help relieve bloating and belching.   Bran taken with plenty of liquids may help relieve constipation.   Keep track of what foods seem to trigger  your symptoms.   Avoid emotionally charged situations or circumstances that produce anxiety.   Start or continue exercising.   Get plenty of rest and sleep.  MAKE SURE YOU:    Understand these instructions.   Will watch your condition.   Will get help right away if you are not doing well or get worse.  Document Released: 10/22/2005 Document Revised: 01/14/2012 Document Reviewed: 06/11/2008  Exit[employer] Patient Information 2013 [employer].

## 2012-11-15 NOTE — Progress Notes (Signed)
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 Stomach distal been upset but is been able to keep fluids down. Starting last night he developed a cough which has been nonproductive and not associated with any fever. He also has right ear pain.  Objective: No acute distress No sign of jaundice, HEENT unremarkable with the exception of distorted right TM. Lungs: Few rhonchi otherwise no rales Heart: Regular no murmur Abdomen: Diffuse mild tenderness with deep palpation, active bowel sounds, no HSM, no masses  Assessment: Acute gastroenteritis, otitis media, and bronchitis  Plan: I refilled his IBS medicine at his request since it seemed to be working very well (Librax) 1. IBS (irritable bowel syndrome)  clidinium-chlordiazePOXIDE (LIBRAX) 2.5-5 MG per capsule  2. Otitis media  levofloxacin (LEVAQUIN) 500 MG tablet  3. Bronchitis  levofloxacin (LEVAQUIN) 500 MG tablet, HYDROcodone-homatropine (HYCODAN) 5-1.5 MG/5ML syrup  4. Cough

## 2012-11-16 ENCOUNTER — Telehealth: Payer: Self-pay

## 2012-11-16 MED ORDER — BENZONATATE 100 MG PO CAPS: 100.0000 mg | ORAL_CAPSULE | Freq: Three times a day (TID) | ORAL | Status: DC | PRN

## 2012-11-16 NOTE — Telephone Encounter (Signed)
Should we advise him to take some to take benadryl and discontinue meds? Please advise.

## 2012-11-16 NOTE — Telephone Encounter (Signed)
Pt notified that rx for Tessalon called in

## 2012-11-16 NOTE — Telephone Encounter (Signed)
Pt states seen yesterday by Dr. Elbert Ewings. States rx'd an antibiotic and cough medicine and one of those is making him itch all over. i suggested pt rtc for eval and he said im just itching not having an allergic rxn. States called pharmacy and they told him to call dr. Rock Nephew states no throat symptoms. i advised pt to come in, but stated i would leave a message.   Best: [phone] Pharm[facility]  bf

## 2012-11-16 NOTE — Telephone Encounter (Signed)
Pt states that he has been taking benadryl which has helped for the itching. He really wants something for his cough.  He took the antibiotic around lunchtime yesterday and the Hycodan syrup around midnight and woke up itching this morning.  Looking back in his paper chart in Jan 2013 looks like we noted that he may have a allergy to hydrocodone.  Chart is [place] [id number]

## 2012-11-16 NOTE — Telephone Encounter (Signed)
Yes.  Advise him to stop the medications and take Benadryl.  If his symptoms completely resolve, he can restart the antibiotic.  If he develops any mouth or throat itching or swelling, he should call 911.

## 2012-11-16 NOTE — Telephone Encounter (Signed)
Paper chart reviewed.  Pt. Was seen here 11/06/2011 with itching after taking tussionex for cough.  Allergy was documented in paper chart, and hydrocodone allergy was documented in the EHR.  Tussionex specifically added to his allergy list, and he should discontinue it.  Meds ordered this encounter  Medications  . benzonatate (TESSALON) 100 MG capsule    Sig: Take 1-2 capsules (100-200 mg total) by mouth 3 (three) times daily as needed for cough.    Dispense:  40 capsule    Refill:  0    Order Specific Question:  Supervising Provider    Answer:  DOOLITTLE, ROBERT P [[id number]]

## 2012-11-22 ENCOUNTER — Ambulatory Visit (INDEPENDENT_AMBULATORY_CARE_PROVIDER_SITE_OTHER): Payer: BC Managed Care – PPO | Admitting: Internal Medicine

## 2012-11-22 ENCOUNTER — Other Ambulatory Visit: Payer: Self-pay | Admitting: Internal Medicine

## 2012-11-22 VITALS — BP 108/74 | HR 98 | Temp 98.5°F | Resp 18 | Ht 70.5 in | Wt 221.4 lb

## 2012-11-22 DIAGNOSIS — R062 Wheezing: Secondary | ICD-10-CM

## 2012-11-22 DIAGNOSIS — R059 Cough, unspecified: Secondary | ICD-10-CM

## 2012-11-22 DIAGNOSIS — R05 Cough: Secondary | ICD-10-CM

## 2012-11-22 DIAGNOSIS — R1011 Right upper quadrant pain: Secondary | ICD-10-CM

## 2012-11-22 LAB — POCT CBC
Granulocyte percent: 63.3 %G (ref 37–80)
Lymph, poc: 2.3 (ref 0.6–3.4)
MCH, POC: 28.8 pg (ref 27–31.2)
MCHC: 31.6 g/dL — AB (ref 31.8–35.4)
MCV: 91.2 fL (ref 80–97)
MID (cbc): 0.6 (ref 0–0.9)
POC LYMPH PERCENT: 29.5 %L (ref 10–50)
Platelet Count, POC: 237 10*3/uL (ref 142–424)
RDW, POC: 14.1 %

## 2012-11-22 LAB — COMPREHENSIVE METABOLIC PANEL
AST: 19 U/L (ref 0–37)
Alkaline Phosphatase: 67 U/L (ref 39–117)
BUN: 20 mg/dL (ref 6–23)
Creat: 1.04 mg/dL (ref 0.50–1.35)
Potassium: 4.4 mEq/L (ref 3.5–5.3)
Total Bilirubin: 0.5 mg/dL (ref 0.3–1.2)

## 2012-11-22 MED ORDER — PROMETHAZINE-DM 6.25-15 MG/5ML PO SYRP: 5.0000 mL | ORAL_SOLUTION | Freq: Four times a day (QID) | ORAL | Status: DC | PRN

## 2012-11-22 MED ORDER — PREDNISONE 20 MG PO TABS: ORAL_TABLET | ORAL | Status: DC

## 2012-11-22 NOTE — Telephone Encounter (Signed)
Pt was seen today adn the rx for his inhaler has been written for two puffs making the copay 70.00 and it should be 4 puffs and the copy is 25.00  Best number

## 2012-11-22 NOTE — Progress Notes (Signed)
Subjective:    Patient ID: Randall Reed, male    DOB: October 04, 1971, 42 y.o.   MRN: [id number]  HPI Pt here for a recheck. Still has a cough, PND and right ear ache. Has been using the Flonase. Still wheezing.  Tried to exercise and was unable to. Started Hydromet and developed some itching. We changed to [facility] and they haven't helped   Also still has abd pain. RUQ. No urinary sxs. As long as pt takes the Librax, he's not as bad. Not any worse with moving. Was worse after eating a couple of days ago. Did have some vomiting also a couple of days ago and says that was the worse episode he's ever had, followed by some diarrhea.  Patient Active Problem List  Diagnosis  . HYPERTENSION, BENIGN  . CHEST PAIN-UNSPECIFIED  . BMI 33.0-33.9,adult  . Allergic rhinitis  . GERD (gastroesophageal reflux disease)  . Migraine  . GAD (generalized anxiety disorder)  . Insomnia  . Palpitations   Prior to Admission medications   Medication Sig Start Date End Date Taking? Authorizing Provider  albuterol (PROVENTIL HFA;VENTOLIN HFA) 108 (90 BASE) MCG/ACT inhaler       ALPRAZolam (XANAX) 1 MG tablet       benzonatate (TESSALON) 100 MG capsule       clidinium-chlordiazePOXIDE (LIBRAX) 2.5-5 MG per capsule              esomeprazole (NEXIUM) 40 MG capsule       fluticasone (FLONASE) 50 MCG/ACT nasal spray                     ipratropium (ATROVENT) 0.06 % nasal spray       ketoprofen (ORUDIS) 75 MG capsule       montelukast (SINGULAIR) 10 MG tablet       rizatriptan (MAXALT) 10 MG tablet       tadalafil (CIALIS) 20 MG tablet              fluticasone (FLONASE) 50 MCG/ACT nasal spray                     lisinopril (PRINIVIL,ZESTRIL) 5 MG tablet              methocarbamol (ROBAXIN) 750 MG tablet                                   traMADol (ULTRAM) 50 MG tablet       [facility] [facility] [employer] 180-240 MG per 24 hr tablet       zolpidem (AMBIEN) 10 MG tablet TAKE ONE TABLET BY MOUTH  ONCE DAILY AT BEDTIME AS NEEDED FOR SLEEP      See recent OV/completed antibiotics     Review of Systems No fever chills or night sweats No vomiting No chest pain or palpitations    Objective:   Physical Exam Irritation at the base of the right ear drum Conjunctiva clear Nares boggy with mucus Throat clear No nodes Chest with wheezing bilaterally on forced expiration Heart regular without murmur Abdomen soft/right upper quadrant tenderness to palpation/no organomegaly or masses       Results for orders placed in visit on 11/22/12  POCT CBC      Component Value Range   WBC 7.9  4.6 - 10.2 K/uL   Lymph, poc 2.3  0.6 - 3.4   POC LYMPH PERCENT  29.5  10 - 50 %L   MID (cbc) 0.6  0 - 0.9   POC MID % 7.2  0 - 12 %M   POC Granulocyte 5.0  2 - 6.9   Granulocyte percent 63.3  37 - 80 %G   RBC 5.00  4.69 - 6.13 M/uL   Hemoglobin 14.4  14.1 - 18.1 g/dL   HCT, POC 16.1  09.6 - 53.7 %   MCV 91.2  80 - 97 fL   MCH, POC 28.8  27 - 31.2 pg   MCHC 31.6 (*) 31.8 - 35.4 g/dL   RDW, POC 04.5     Platelet Count, POC 237  142 - 424 K/uL   MPV 8.3  0 - 99.8 fL    Assessment & Plan:   1. RUQ abdominal pain    2. Cough    3. Wheezing (RAD)    Meds ordered this encounter  Medications  . predniSONE (DELTASONE) 20 MG tablet    Sig: 3/3/2/2/1/1 single daily dose for 6 days    Dispense:  12 tablet    Refill:  0  . promethazine-dextromethorphan (PROMETHAZINE-DM) 6.25-15 MG/5ML syrup    Sig: Take 5 mLs by mouth 4 (four) times daily as needed for cough.    Dispense:  120 mL    Refill:  0   Continue Singulair and inhalers Metabolic profile and sedimentation rate Consider right upper quadrant ultrasound

## 2012-11-23 NOTE — Telephone Encounter (Signed)
Left message to advise. He is instructed to call back with questions.

## 2012-11-23 NOTE — Telephone Encounter (Signed)
Called pt to advise max dosing with this is 2 puffs.

## 2012-11-27 ENCOUNTER — Telehealth: Payer: Self-pay | Admitting: Radiology

## 2012-11-27 NOTE — Telephone Encounter (Signed)
Dr Merla Riches new Rx for Atrovent inhaler states 2 puffs bid,which means the inhaler will last 50 days, so patient is being charged 2 copays for this medication. He would only have one copay if the Rx states use 2 puffs bid or as instructed pharmacist at  [facility] wants to know if we will change the sig again, to indicate this. Please advise.

## 2012-11-27 NOTE — Telephone Encounter (Signed)
Last rx we did was October and for 2 p bid--where did this one come from???

## 2012-11-28 NOTE — Telephone Encounter (Signed)
Tell pharmacy they can change sig anyway needed to make one copay

## 2012-11-28 NOTE — Telephone Encounter (Signed)
Called and left message for pharmacy to advise this is fine.

## 2012-11-28 NOTE — Telephone Encounter (Signed)
Yes, the 2 puffs bid were written, but when he got it filled he had to pay 2 copays, they are asking about changing the sig,(to indicate he can use more than 4 puffs daily if needed) so he can only have 1 copay, he does not need refill, he has it on hold at the pharmacy already. I have already advised the max for this should be 4 puffs, but if you dose higher, he will have a lower co pay.

## 2012-12-16 ENCOUNTER — Ambulatory Visit (INDEPENDENT_AMBULATORY_CARE_PROVIDER_SITE_OTHER): Payer: BC Managed Care – PPO | Admitting: Internal Medicine

## 2012-12-16 VITALS — BP 105/75 | HR 80 | Temp 98.5°F | Resp 18 | Wt 222.0 lb

## 2012-12-16 DIAGNOSIS — R197 Diarrhea, unspecified: Secondary | ICD-10-CM

## 2012-12-16 DIAGNOSIS — R1011 Right upper quadrant pain: Secondary | ICD-10-CM

## 2012-12-16 DIAGNOSIS — R1013 Epigastric pain: Secondary | ICD-10-CM

## 2012-12-16 DIAGNOSIS — R509 Fever, unspecified: Secondary | ICD-10-CM

## 2012-12-16 LAB — POCT CBC
Granulocyte percent: 80.2 %G — AB (ref 37–80)
HCT, POC: 46.2 % (ref 43.5–53.7)
Hemoglobin: 15.3 g/dL (ref 14.1–18.1)
Lymph, poc: 1.4 (ref 0.6–3.4)
POC Granulocyte: 8.6 — AB (ref 2–6.9)
RBC: 5.14 M/uL (ref 4.69–6.13)

## 2012-12-16 LAB — COMPREHENSIVE METABOLIC PANEL
ALT: 15 U/L (ref 0–53)
Albumin: 4.1 g/dL (ref 3.5–5.2)
CO2: 29 mEq/L (ref 19–32)
Chloride: 102 mEq/L (ref 96–112)
Glucose, Bld: 80 mg/dL (ref 70–99)
Potassium: 3.8 mEq/L (ref 3.5–5.3)
Sodium: 140 mEq/L (ref 135–145)
Total Protein: 6.6 g/dL (ref 6.0–8.3)

## 2012-12-16 MED ORDER — CIPROFLOXACIN HCL 500 MG PO TABS: 500.0000 mg | ORAL_TABLET | Freq: Two times a day (BID) | ORAL | Status: DC

## 2012-12-16 NOTE — Progress Notes (Signed)
  Subjective:    Patient ID: Randall Reed, male    DOB: [birth year], 42 y.o.   MRN: [id number]  Fever  Associated symptoms include abdominal pain, diarrhea, ear pain, headaches, nausea and vomiting.  Diarrhea  Associated symptoms include abdominal pain, chills, a fever, headaches and vomiting.   Patient comes into our office today with complaints of have abdominal pain diarrhea and some nausea that started Sunday.  He has had some chills and a fever that started Monday and it broke over night while he was sleep.  He's still having some diarrhea as of today. Lots of cramping. No gu sxt  See hx recent recurrent GI sxt with ruq pain intermittently/?assoc w/ eating   Review of Systems  Constitutional: Positive for fever, chills and fatigue.       Had this on Monday but doesn't have it today  HENT: Positive for ear pain.   Gastrointestinal: Positive for nausea, vomiting, abdominal pain and diarrhea.  Musculoskeletal: Positive for back pain.  Neurological: Positive for headaches. Negative for dizziness.       Objective:   Physical Exam No acute distress Vital signs stable Heart regular with a rate of 80/no murmur Lungs clear Abdomen with active bowel sounds/no organomegaly or masses  Tender to palpation generally but no rebound, guarding or distention       Results for orders placed in visit on 12/16/12  POCT CBC      Result Value Range   WBC 10.7 (*) 4.6 - 10.2 K/uL   Lymph, poc 1.4  0.6 - 3.4   POC LYMPH PERCENT 13.1  10 - 50 %L   MID (cbc) 0.7  0 - 0.9   POC MID % 6.7  0 - 12 %M   POC Granulocyte 8.6 (*) 2 - 6.9   Granulocyte percent 80.2 (*) 37 - 80 %G   RBC 5.14  4.69 - 6.13 M/uL   Hemoglobin 15.3  14.1 - 18.1 g/dL   HCT, POC 40.9  81.1 - 53.7 %   MCV 89.9  80 - 97 fL   MCH, POC 29.8  27 - 31.2 pg   MCHC 33.1  31.8 - 35.4 g/dL   RDW, POC 91.4     Platelet Count, POC 191  142 - 424 K/uL   MPV 8.4  0 - 99.8 fL    Assessment & Plan:  Problem #1 fever Problem #2  diarrhea Problem #3 abdominal pain Problem #4 recurrent right upper quadrant pain  Metabolic profile Stool culture Start Cipro 500 twice a day Okay for Imodium Go ahead and schedule right upper quadrant ultrasound when he is well

## 2012-12-21 LAB — STOOL CULTURE

## 2012-12-22 ENCOUNTER — Encounter: Payer: Self-pay | Admitting: Internal Medicine

## 2012-12-23 ENCOUNTER — Ambulatory Visit
Admission: RE | Admit: 2012-12-23 | Discharge: 2012-12-23 | Disposition: A | Discharge status: Home / Self Care | Payer: BC Managed Care – PPO | Source: Ambulatory Visit | Attending: Internal Medicine | Admitting: Internal Medicine

## 2012-12-23 ENCOUNTER — Other Ambulatory Visit: Payer: Self-pay

## 2012-12-23 DIAGNOSIS — R1011 Right upper quadrant pain: Secondary | ICD-10-CM

## 2012-12-25 ENCOUNTER — Other Ambulatory Visit: Payer: Self-pay | Admitting: Internal Medicine

## 2012-12-27 ENCOUNTER — Telehealth: Payer: Self-pay | Admitting: *Deleted

## 2012-12-27 NOTE — Telephone Encounter (Signed)
[facility] [address] requesting refill on diclofenac 75mg .

## 2012-12-28 MED ORDER — DICLOFENAC SODIUM 75 MG PO TBEC: 75.0000 mg | DELAYED_RELEASE_TABLET | Freq: Two times a day (BID) | ORAL | Status: DC

## 2012-12-28 NOTE — Telephone Encounter (Signed)
OK 

## 2012-12-31 ENCOUNTER — Other Ambulatory Visit: Payer: Self-pay | Admitting: Internal Medicine

## 2013-01-01 ENCOUNTER — Other Ambulatory Visit: Payer: Self-pay | Admitting: Internal Medicine

## 2013-01-01 NOTE — Telephone Encounter (Signed)
PT SAYS PHARMACY HAS SENT REQUESTS FOR A REFILL ON HIS ZOLPIDEM.  SAYS THE LAST FEW TIMES THERE HAS BEEN TROUBLE TRYING TO GET THIS FILLED.  HE DOESN'T KNOW IF THE PHARMACY IS ACTUALLY SENDING IT IN OR WHAT.  PLEASE CALL [phone]

## 2013-01-02 NOTE — Telephone Encounter (Signed)
i did this today re another message

## 2013-01-04 NOTE — Telephone Encounter (Signed)
Patient is requesting a refill on Ambien medication he states that pharmacy sent in a request for a refill since last week and hasn't heard anything since. He has been out of the medication for a week now. If further questions contact pt at [phone]. Thank you

## 2013-01-04 NOTE — Telephone Encounter (Signed)
Pt was told that medication was sent to the pharmacy on the 27th patient insist that there is no medication is there

## 2013-01-05 NOTE — Telephone Encounter (Signed)
Left message on machine Rx was called to [facility] at [facility].

## 2013-01-05 NOTE — Telephone Encounter (Signed)
i said yes to this again Friday yet he says he still doesn't have it//this started back on the 11th febru Please see what happened

## 2013-01-19 ENCOUNTER — Ambulatory Visit (INDEPENDENT_AMBULATORY_CARE_PROVIDER_SITE_OTHER): Payer: BC Managed Care – PPO | Admitting: Family Medicine

## 2013-01-19 VITALS — BP 120/80 | HR 77 | Temp 98.6°F | Resp 18 | Wt 230.0 lb

## 2013-01-19 DIAGNOSIS — Z205 Contact with and (suspected) exposure to viral hepatitis: Secondary | ICD-10-CM

## 2013-01-19 DIAGNOSIS — J309 Allergic rhinitis, unspecified: Secondary | ICD-10-CM

## 2013-01-19 DIAGNOSIS — J0100 Acute maxillary sinusitis, unspecified: Secondary | ICD-10-CM

## 2013-01-19 DIAGNOSIS — D1803 Hemangioma of intra-abdominal structures: Secondary | ICD-10-CM

## 2013-01-19 DIAGNOSIS — K7689 Other specified diseases of liver: Secondary | ICD-10-CM

## 2013-01-19 DIAGNOSIS — Z20828 Contact with and (suspected) exposure to other viral communicable diseases: Secondary | ICD-10-CM

## 2013-01-19 MED ORDER — AZITHROMYCIN 250 MG PO TABS: ORAL_TABLET | ORAL | Status: DC

## 2013-01-19 MED ORDER — PROMETHAZINE-DM 6.25-15 MG/5ML PO SYRP: 5.0000 mL | ORAL_SOLUTION | Freq: Four times a day (QID) | ORAL | Status: DC | PRN

## 2013-01-19 NOTE — Progress Notes (Signed)
Subjective:    Patient ID: Randall Reed, male    DOB: 23-Dec-1970, 42 y.o.   MRN: [id number] Chief Complaint  Patient presents with  . Sinusitis  . family medical issue    HPI  Sore throat, eat pain, cough, normal sinus infection that always goes into chest - leaving town in 2d.  Has been trying mucinex for about wk.  Has been brewing for about a wk with sinus pressure.  Had some chills last night.  Often works with antibiotic - amox or levaquin - and cough med.  Intolerant of many cough meds - finally tried one last time that worked and didn't cause side effects.   He recently had an abnormal liver US and would like it explained to him- was googling it and was concerned.  Recently found that his grandfather - who had myelodysplastic leukemia and received regular transfusions received blood products contaminated with Hep C in 2010.  He was just informed of this a mo ago (his grandfather passed away 15 months ago) and pt performed a lot of his grandfather's medical care and would like to be tested - he is esp nervous about this due to the liver abnml seen on [place].   Past Medical History  Diagnosis Date  . GERD (gastroesophageal reflux disease)   . Hypertension   . Anxiety   . Seasonal allergic reaction   . Depression    Current Outpatient Prescriptions on File Prior to Visit  Medication Sig Dispense Refill  . albuterol (PROVENTIL HFA;VENTOLIN HFA) 108 (90 BASE) MCG/ACT inhaler Inhale 2 puffs into the lungs every 6 (six) hours as needed.  1 Inhaler  5  . ALPRAZolam (XANAX) 1 MG tablet Take 1 tablet (1 mg total) by mouth 3 (three) times daily as needed for sleep.  90 tablet  5  . ATROVENT HFA 17 MCG/ACT inhaler INHALE 2 PUFFS INTO THE LUNGS TWICE DAILY OR AS DIRECTED 1 INHALER TO LAST 30 DAYS  12.9 g  0  . clidinium-chlordiazePOXIDE (LIBRAX) 2.5-5 MG per capsule Take 1 capsule by mouth 2 (two) times daily.  60 capsule  3  . diclofenac (VOLTAREN) 75 MG EC tablet Take 1 tablet (75 mg total)  by mouth 2 (two) times daily.  60 tablet  0  . esomeprazole (NEXIUM) 40 MG capsule Take 1 capsule (40 mg total) by mouth daily before breakfast.  90 capsule  3  . fexofenadine (ALLEGRA) 180 MG tablet Take 180 mg by mouth daily.      . fluticasone (FLONASE) 50 MCG/ACT nasal spray Place 2 sprays into the nose daily.  1 g  11  . hydrocortisone (ANUSOL-HC) 2.5 % rectal cream Place rectally 2 (two) times daily.  30 g  3  . ipratropium (ATROVENT) 0.06 % nasal spray Place 2 sprays into the nose 2 (two) times daily.  15 mL  11  . ketoprofen (ORUDIS) 75 MG capsule Take 75 mg by mouth 4 (four) times daily as needed.      Marland Kitchen MAXALT-MLT 10 MG disintegrating tablet AS DIRECTED  9 tablet  2  . montelukast (SINGULAIR) 10 MG tablet TAKE 1 TABLET BY MOUTH EVERY NIGHT AT BEDTIME  30 tablet  4  . rizatriptan (MAXALT) 10 MG tablet Take 1 tablet (10 mg total) by mouth 2 (two) times daily as needed. May repeat in 2 hours if needed for migraine  10 tablet  11  . tadalafil (CIALIS) 20 MG tablet Take one half to one tablet one hour prior  to intercourse up to every 48 hours  5 tablet  11  . zolpidem (AMBIEN) 10 MG tablet TAKE 1 TABLET BY MOUTH EVERY NIGHT AT BEDTIME AS NEEDED FOR SLEEP  30 tablet  0   No current facility-administered medications on file prior to visit.   Allergies  Allergen Reactions  . Desloratadine Other (See Comments)    CLARINEX-"severe headache"  . Hydrocod Polst-Cpm Polst Er Itching  . Loratadine Other (See Comments)    "severe headache"  . Telithromycin   . Tussionex Pennkinetic Er (Hydrocod Polst-Cpm Polst Er) Itching  . Levbid (Hyoscyamine Sulfate) Rash     Review of Systems  Constitutional: Positive for chills, diaphoresis and fatigue. Negative for fever, activity change, appetite change and unexpected weight change.  HENT: Positive for ear pain, congestion, sore throat, rhinorrhea, postnasal drip and sinus pressure. Negative for mouth sores, neck pain and neck stiffness.    Respiratory: Positive for cough. Negative for shortness of breath.   Cardiovascular: Negative for chest pain.  Gastrointestinal: Negative for nausea, vomiting, abdominal pain, diarrhea and constipation.  Genitourinary: Negative for dysuria.  Musculoskeletal: Negative for myalgias and arthralgias.  Skin: Negative for rash.  Neurological: Positive for headaches. Negative for syncope.  Hematological: Negative for adenopathy.      BP 120/80  Pulse 77  Temp(Src) 98.6 F (37 C) (Oral)  Resp 18  Wt 230 lb (104.327 kg)  BMI 32.52 kg/m2 Objective:   Physical Exam  Constitutional: He is oriented to person, place, and time. He appears well-developed and well-nourished. No distress.  HENT:  Head: Normocephalic and atraumatic.  Right Ear: External ear and ear canal normal. Tympanic membrane is injected and bulging. A middle ear effusion is present.  Left Ear: External ear and ear canal normal. Tympanic membrane is retracted.  Nose: Mucosal edema and rhinorrhea present. Right sinus exhibits maxillary sinus tenderness. Left sinus exhibits no maxillary sinus tenderness.  Mouth/Throat: Uvula is midline and mucous membranes are normal. Posterior oropharyngeal edema and posterior oropharyngeal erythema present. No oropharyngeal exudate.  Right nare significantly more swollen with purulent discharge  Eyes: Conjunctivae are normal. Right eye exhibits no discharge. Left eye exhibits no discharge. No scleral icterus.  Neck: Normal range of motion. Neck supple. No thyromegaly present.  Cardiovascular: Normal rate, regular rhythm, normal heart sounds and intact distal pulses.   Pulmonary/Chest: Effort normal and breath sounds normal. No respiratory distress.  Lymphadenopathy:       Head (right side): Submandibular adenopathy present.       Head (left side): Submandibular adenopathy present.    He has no cervical adenopathy.       Right: No supraclavicular adenopathy present.       Left: No  supraclavicular adenopathy present.  Neurological: He is alert and oriented to person, place, and time.  Skin: Skin is warm and dry. He is not diaphoretic. No erythema.  Psychiatric: He has a normal mood and affect. His behavior is normal.      Assessment & Plan:  Allergic rhinitis  Exposure to hepatitis C - Plan: Hepatitis C antibody  Sinusitis, acute maxillary - Plan: azithromycin (ZITHROMAX) 250 MG tablet  Liver cyst - Plan: US Abdomen Limited RUQ  Hemangioma of liver - Plan: US Abdomen Limited RUQ - recheck liver US in 2 months - future order entered.  Meds ordered this encounter  Medications  . azithromycin (ZITHROMAX) 250 MG tablet    Sig: Take 2 tabs PO x 1 dose, then 1 tab PO QD x 4 days  Dispense:  6 tablet    Refill:  0  . promethazine-dextromethorphan (PROMETHAZINE-DM) 6.25-15 MG/5ML syrup    Sig: Take 5 mLs by mouth 4 (four) times daily as needed for cough.    Dispense:  180 mL    Refill:  0

## 2013-02-08 ENCOUNTER — Other Ambulatory Visit: Payer: Self-pay | Admitting: Internal Medicine

## 2013-02-08 NOTE — Telephone Encounter (Signed)
Ok Meds ordered this encounter  Medications  . zolpidem (AMBIEN) 10 MG tablet    Sig: TAKE 1 TABLET BY MOUTH ONCE DAILY AT BEDTIME AS NEEDED FOR SLEEP    Dispense:  30 tablet    Refill:  5

## 2013-02-09 ENCOUNTER — Ambulatory Visit: Payer: BC Managed Care – PPO

## 2013-02-09 ENCOUNTER — Encounter: Payer: Self-pay | Admitting: Family Medicine

## 2013-02-09 ENCOUNTER — Ambulatory Visit (INDEPENDENT_AMBULATORY_CARE_PROVIDER_SITE_OTHER): Payer: BC Managed Care – PPO | Admitting: Family Medicine

## 2013-02-09 VITALS — BP 128/88 | HR 92 | Temp 97.8°F | Resp 18 | Ht 70.5 in | Wt 229.0 lb

## 2013-02-09 DIAGNOSIS — H60399 Other infective otitis externa, unspecified ear: Secondary | ICD-10-CM

## 2013-02-09 DIAGNOSIS — H60391 Other infective otitis externa, right ear: Secondary | ICD-10-CM

## 2013-02-09 DIAGNOSIS — R635 Abnormal weight gain: Secondary | ICD-10-CM

## 2013-02-09 DIAGNOSIS — J0100 Acute maxillary sinusitis, unspecified: Secondary | ICD-10-CM

## 2013-02-09 DIAGNOSIS — J3489 Other specified disorders of nose and nasal sinuses: Secondary | ICD-10-CM

## 2013-02-09 DIAGNOSIS — R05 Cough: Secondary | ICD-10-CM

## 2013-02-09 DIAGNOSIS — R059 Cough, unspecified: Secondary | ICD-10-CM

## 2013-02-09 LAB — POCT GLYCOSYLATED HEMOGLOBIN (HGB A1C): Hemoglobin A1C: 4.8

## 2013-02-09 LAB — LIPID PANEL
LDL Cholesterol: 121 mg/dL — ABNORMAL HIGH (ref 0–99)
Triglycerides: 118 mg/dL (ref <150)

## 2013-02-09 LAB — POCT CBC
HCT, POC: 46.5 % (ref 43.5–53.7)
Hemoglobin: 15.3 g/dL (ref 14.1–18.1)
Lymph, poc: 1.6 (ref 0.6–3.4)
MCH, POC: 29.4 pg (ref 27–31.2)
MCHC: 32.9 g/dL (ref 31.8–35.4)
WBC: 6.4 10*3/uL (ref 4.6–10.2)

## 2013-02-09 LAB — COMPREHENSIVE METABOLIC PANEL
ALT: 17 U/L (ref 0–53)
CO2: 26 mEq/L (ref 19–32)
Calcium: 9.7 mg/dL (ref 8.4–10.5)
Chloride: 104 mEq/L (ref 96–112)
Creat: 0.96 mg/dL (ref 0.50–1.35)

## 2013-02-09 LAB — TSH: TSH: 2.321 u[IU]/mL (ref 0.350–4.500)

## 2013-02-09 MED ORDER — PROMETHAZINE-DM 6.25-15 MG/5ML PO SYRP: 5.0000 mL | ORAL_SOLUTION | Freq: Four times a day (QID) | ORAL | Status: DC | PRN

## 2013-02-09 MED ORDER — HYDROCORTISONE-ACETIC ACID 1-2 % OT SOLN: 5.0000 [drp] | Freq: Three times a day (TID) | OTIC | Status: DC

## 2013-02-09 MED ORDER — CEFDINIR 300 MG PO CAPS: 300.0000 mg | ORAL_CAPSULE | Freq: Two times a day (BID) | ORAL | Status: DC

## 2013-02-09 NOTE — Progress Notes (Signed)
[facility] [address] [place] [address] [phone]  Date:  02/09/2013   Name:  Randall Reed   DOB:  25-Jul-1971   MRN:  [id number]  PCP:  Tonye Pearson, MD    Chief Complaint: sinus congestion and concerns about weight   History of Present Illness:  Randall Reed is a 42 y.o. very pleasant male patient who presents with the following:  He is concerned about "another sinus infection."  He takes allegra D, flonase and singulair.  He was seen here about 3 weeks ago and was treated with azithromycin.  He did get well in between times.  Yesterday he noted onset of a cough.  On Friday he had increased nasal congestion, ear pain and congestion.  He has runny and stuffy nose.  He has some sneezing.   He does not noted fever, chills or body aches No GI symptoms.    He is concerned about his weight.  He had been up to 300 lbs, but lost down to 208 as of last fall.  He lost the weight over a period of about 18 months by diet and exercise change.  He has not changed anything about his diet or exercise habits recenlty.  He does exercise 90 minutes a day.  However, he now notes that he has gained up to around 230 again.    He would like to check his T, TSH and his A1c. He does have a past history of DM, but was able to treat it through diet and exercise.    Patient Active Problem List  Diagnosis  . HYPERTENSION, BENIGN  . CHEST PAIN-UNSPECIFIED  . BMI 33.0-33.9,adult  . Allergic rhinitis  . GERD (gastroesophageal reflux disease)  . Migraine  . GAD (generalized anxiety disorder)  . Insomnia  . Palpitations    Past Medical History  Diagnosis Date  . GERD (gastroesophageal reflux disease)   . Hypertension   . Anxiety   . Seasonal allergic reaction   . Depression   . Arthritis   . Asthma     Past Surgical History  Procedure Laterality Date  . None    . None      History  Substance Use Topics  . Smoking status: Never Smoker   . Smokeless  tobacco: Not on file  . Alcohol Use: 1.5 oz/week    3 drink(s) per week    Family History  Problem Relation Age of Onset  . Hypertension    . Heart attack    . Cancer    . Breast cancer    . Hyperlipidemia    . Coronary artery disease Father 68    stents, CABG  . Heart attack Father   . Coronary artery disease Paternal Grandfather   . Heart attack Maternal Grandfather     Allergies  Allergen Reactions  . Desloratadine Other (See Comments)    CLARINEX-"severe headache"  . Hydrocod Polst-Cpm Polst Er Itching  . Loratadine Other (See Comments)    "severe headache"  . Telithromycin   . Tussionex Pennkinetic Er (Hydrocod Polst-Cpm Polst Er) Itching  . Levbid (Hyoscyamine Sulfate) Rash    Medication list has been reviewed and updated.  Current Outpatient Prescriptions on File Prior to Visit  Medication Sig Dispense Refill  . albuterol (PROVENTIL HFA;VENTOLIN HFA) 108 (90 BASE) MCG/ACT inhaler Inhale 2 puffs into the lungs every 6 (six) hours as needed.  1 Inhaler  5  . ALPRAZolam (XANAX) 1 MG tablet  Take 1 tablet (1 mg total) by mouth 3 (three) times daily as needed for sleep.  90 tablet  5  . ATROVENT HFA 17 MCG/ACT inhaler INHALE 2 PUFFS INTO THE LUNGS TWICE DAILY OR AS DIRECTED 1 INHALER TO LAST 30 DAYS  12.9 g  0  . clidinium-chlordiazePOXIDE (LIBRAX) 2.5-5 MG per capsule Take 1 capsule by mouth 2 (two) times daily.  60 capsule  3  . diclofenac (VOLTAREN) 75 MG EC tablet Take 1 tablet (75 mg total) by mouth 2 (two) times daily.  60 tablet  0  . esomeprazole (NEXIUM) 40 MG capsule Take 1 capsule (40 mg total) by mouth daily before breakfast.  90 capsule  3  . fexofenadine (ALLEGRA) 180 MG tablet Take 180 mg by mouth daily.      . fluticasone (FLONASE) 50 MCG/ACT nasal spray Place 2 sprays into the nose daily.  1 g  11  . hydrocortisone (ANUSOL-HC) 2.5 % rectal cream Place rectally 2 (two) times daily.  30 g  3  . ipratropium (ATROVENT) 0.06 % nasal spray Place 2 sprays into  the nose 2 (two) times daily.  15 mL  11  . ketoprofen (ORUDIS) 75 MG capsule Take 75 mg by mouth 4 (four) times daily as needed.      Marland Kitchen MAXALT-MLT 10 MG disintegrating tablet AS DIRECTED  9 tablet  2  . montelukast (SINGULAIR) 10 MG tablet TAKE 1 TABLET BY MOUTH EVERY NIGHT AT BEDTIME  30 tablet  4  . promethazine-dextromethorphan (PROMETHAZINE-DM) 6.25-15 MG/5ML syrup Take 5 mLs by mouth 4 (four) times daily as needed for cough.  180 mL  0  . tadalafil (CIALIS) 20 MG tablet Take one half to one tablet one hour prior to intercourse up to every 48 hours  5 tablet  11  . zolpidem (AMBIEN) 10 MG tablet TAKE 1 TABLET BY MOUTH ONCE DAILY AT BEDTIME AS NEEDED FOR SLEEP  30 tablet  5  . azithromycin (ZITHROMAX) 250 MG tablet Take 2 tabs PO x 1 dose, then 1 tab PO QD x 4 days  6 tablet  0  . rizatriptan (MAXALT) 10 MG tablet Take 1 tablet (10 mg total) by mouth 2 (two) times daily as needed. May repeat in 2 hours if needed for migraine  10 tablet  11   No current facility-administered medications on file prior to visit.    Review of Systems:  As per HPI- otherwise negative.   Physical Examination: Filed Vitals:   02/09/13 0828  BP: 128/88  Pulse: 92  Temp: 97.8 F (36.6 C)  Resp: 18   Filed Vitals:   02/09/13 0828  Height: 5' 10.5" (1.791 m)  Weight: 229 lb (103.874 kg)   Body mass index is 32.38 kg/(m^2). Ideal Body Weight: Weight in (lb) to have BMI = 25: 176.4  GEN: WDWN, NAD, Non-toxic, A & O x 3, overweight HEENT: Atraumatic, Normocephalic. Neck supple. No masses, No LAD.  Bilateral TM wnl, oropharynx normal.  PEERL,EOMI.   Left ear canal is slight damp in appearance, as in low level OE Ears and Nose: No external deformity. CV: RRR, No M/G/R. No JVD. No thrill. No extra heart sounds. PULM: CTA B, no wheezes, crackles, rhonchi. No retractions. No resp. distress. No accessory muscle use. EXTR: No c/c/e NEURO Normal gait.  PSYCH: Normally interactive. Conversant. Not depressed  or anxious appearing.  Calm demeanor.   [facility] reading (PRIMARY) by  Dr. Patsy Lager. Sinus series: slightly increased opacity in the right maxillary  sinus PARANASAL SINUSES - COMPLETE 3 + VIEW  Comparison: Head CT 02/20/2012  Findings: The paranasal sinuses are aerated but there are increased densities in the right maxillary sinus compared to the left. No gross abnormality to the ethmoid air cells, frontal sinuses or visualized sphenoid sinuses. No gross abnormality to the mandible.  IMPRESSION: Slightly increased densities in the right maxillary sinus could represent some mucosal disease.   Results for orders placed in visit on 02/09/13  POCT CBC      Result Value Range   WBC 6.4  4.6 - 10.2 K/uL   Lymph, poc 1.6  0.6 - 3.4   POC LYMPH PERCENT 24.6  10 - 50 %L   MID (cbc) 0.6  0 - 0.9   POC MID % 8.9  0 - 12 %M   POC Granulocyte 4.3  2 - 6.9   Granulocyte percent 66.5  37 - 80 %G   RBC 5.20  4.69 - 6.13 M/uL   Hemoglobin 15.3  14.1 - 18.1 g/dL   HCT, POC 16.1  09.6 - 53.7 %   MCV 89.4  80 - 97 fL   MCH, POC 29.4  27 - 31.2 pg   MCHC 32.9  31.8 - 35.4 g/dL   RDW, POC 04.5     Platelet Count, POC 240  142 - 424 K/uL   MPV 8.0  0 - 99.8 fL  POCT GLYCOSYLATED HEMOGLOBIN (HGB A1C)      Result Value Range   Hemoglobin A1C 4.8      Assessment and Plan: Sinus pain - Plan: POCT CBC, DG Sinuses Complete  Weight gain - Plan: POCT glycosylated hemoglobin (Hb A1C), Comprehensive metabolic panel, TSH, Testosterone, Lipid panel  Otitis, externa, infective, right - Plan: acetic acid-hydrocortisone (VOSOL-HC) otic solution  Sinusitis, acute maxillary - Plan: cefdinir (OMNICEF) 300 MG capsule  Cough - Plan: promethazine-dextromethorphan (PROMETHAZINE-DM) 6.25-15 MG/5ML syrup  He has used the phenergan DM in the past (used it last month) without any allergic reaction.  He would like to have some more of this to use for his cough this month.  Otherwise believe he probably has a viral URI  as his symptoms just started yesterday.  However, did give an omnicef rx to hold.  Will check labs as per his request and get back to him with result Signed Abbe Amsterdam, MD  Called re: x-ray over-read report.  Possible early sinus infection.  However, would still hold his omnicef for a few days as his symptoms started yesterday and he just took azithromycin a few weeks ago.  If he does start the omnicef would recommend that he take probiotics   Of note I did erase 3 sets of BP and pulse- these were entered on this pt in error and belong to another pt.

## 2013-02-09 NOTE — Patient Instructions (Addendum)
Try mixing 1/2 and 1/2 white vinegar and rubbing alcohol and use it in your ears after swimming to help prevent infection    Use the ear drops for your right ear for the next week. If this does not help please let me know.    As far as the sinus symptoms, you most likely have a viral URI.  Treat your symptoms with the cough syrup and ibuprofen/ tylenol.  If you do not get better in the next few days you can fill and start the omnicef rx.    I will be in touch with the rest of your labs as soon as they come in.

## 2013-02-17 ENCOUNTER — Other Ambulatory Visit: Payer: Self-pay | Admitting: Internal Medicine

## 2013-03-05 ENCOUNTER — Other Ambulatory Visit: Payer: Self-pay | Admitting: Internal Medicine

## 2013-03-05 ENCOUNTER — Other Ambulatory Visit: Payer: Self-pay | Admitting: Family Medicine

## 2013-03-26 ENCOUNTER — Ambulatory Visit (INDEPENDENT_AMBULATORY_CARE_PROVIDER_SITE_OTHER): Payer: BC Managed Care – PPO | Admitting: Family Medicine

## 2013-03-26 VITALS — BP 128/78 | HR 101 | Temp 98.6°F | Resp 16 | Ht 71.0 in | Wt 233.8 lb

## 2013-03-26 DIAGNOSIS — J209 Acute bronchitis, unspecified: Secondary | ICD-10-CM

## 2013-03-26 DIAGNOSIS — Z2839 Other underimmunization status: Secondary | ICD-10-CM

## 2013-03-26 DIAGNOSIS — R059 Cough, unspecified: Secondary | ICD-10-CM

## 2013-03-26 DIAGNOSIS — Z23 Encounter for immunization: Secondary | ICD-10-CM

## 2013-03-26 DIAGNOSIS — R05 Cough: Secondary | ICD-10-CM

## 2013-03-26 DIAGNOSIS — K0510 Chronic gingivitis, plaque induced: Secondary | ICD-10-CM

## 2013-03-26 DIAGNOSIS — Z283 Underimmunization status: Secondary | ICD-10-CM

## 2013-03-26 MED ORDER — PREDNISONE 20 MG PO TABS: ORAL_TABLET | ORAL | Status: DC

## 2013-03-26 MED ORDER — PROMETHAZINE-DM 6.25-15 MG/5ML PO SYRP: 5.0000 mL | ORAL_SOLUTION | Freq: Four times a day (QID) | ORAL | Status: DC | PRN

## 2013-03-26 MED ORDER — PENICILLIN V POTASSIUM 500 MG PO TABS: 500.0000 mg | ORAL_TABLET | Freq: Three times a day (TID) | ORAL | Status: DC

## 2013-03-26 MED ORDER — TETANUS-DIPHTH-ACELL PERTUSSIS 5-2.5-18.5 LF-MCG/0.5 IM SUSP: 0.5000 mL | Freq: Once | INTRAMUSCULAR | Status: DC

## 2013-03-26 NOTE — Progress Notes (Signed)
42 yo man who works for the [employer] here in [place].  He has 2 days of chest tightness following nasal congestion 5 days ago.  He is developing hoarseness.  Hot flush today  No nausea or vomiting No chest or leg pains  PMHx:  Bad allergies  Objective:  NAD

## 2013-03-27 ENCOUNTER — Ambulatory Visit
Admission: RE | Admit: 2013-03-27 | Discharge: 2013-03-27 | Disposition: A | Discharge status: Home / Self Care | Payer: BC Managed Care – PPO | Source: Ambulatory Visit | Attending: Family Medicine | Admitting: Family Medicine

## 2013-03-27 DIAGNOSIS — D1803 Hemangioma of intra-abdominal structures: Secondary | ICD-10-CM

## 2013-03-27 DIAGNOSIS — K7689 Other specified diseases of liver: Secondary | ICD-10-CM

## 2013-04-17 ENCOUNTER — Other Ambulatory Visit: Payer: Self-pay | Admitting: Internal Medicine

## 2013-04-17 NOTE — Telephone Encounter (Signed)
Meds ordered this encounter  Medications  . ALPRAZolam (XANAX) 1 MG tablet    Sig: TAKE 1 TABLET BY MOUTH THREE TIMES DAILY AS NEEDED    Dispense:  90 tablet    Refill:  0  may fax or call in

## 2013-05-12 ENCOUNTER — Other Ambulatory Visit: Payer: Self-pay | Admitting: Physician Assistant

## 2013-05-25 ENCOUNTER — Ambulatory Visit (INDEPENDENT_AMBULATORY_CARE_PROVIDER_SITE_OTHER): Payer: BC Managed Care – PPO | Admitting: Family Medicine

## 2013-05-25 VITALS — BP 126/74 | HR 55 | Temp 98.2°F | Resp 18 | Ht 71.5 in | Wt 221.0 lb

## 2013-05-25 DIAGNOSIS — B372 Candidiasis of skin and nail: Secondary | ICD-10-CM

## 2013-05-25 DIAGNOSIS — R21 Rash and other nonspecific skin eruption: Secondary | ICD-10-CM

## 2013-05-25 DIAGNOSIS — F411 Generalized anxiety disorder: Secondary | ICD-10-CM

## 2013-05-25 DIAGNOSIS — J309 Allergic rhinitis, unspecified: Secondary | ICD-10-CM

## 2013-05-25 DIAGNOSIS — M25569 Pain in unspecified knee: Secondary | ICD-10-CM

## 2013-05-25 DIAGNOSIS — G4700 Insomnia, unspecified: Secondary | ICD-10-CM

## 2013-05-25 DIAGNOSIS — E291 Testicular hypofunction: Secondary | ICD-10-CM

## 2013-05-25 DIAGNOSIS — R7989 Other specified abnormal findings of blood chemistry: Secondary | ICD-10-CM

## 2013-05-25 LAB — POCT SKIN KOH: Skin KOH, POC: POSITIVE

## 2013-05-25 MED ORDER — DOXYCYCLINE HYCLATE 100 MG PO TABS: 100.0000 mg | ORAL_TABLET | Freq: Two times a day (BID) | ORAL | Status: DC

## 2013-05-25 MED ORDER — CLOTRIMAZOLE 1 % EX CREA: TOPICAL_CREAM | Freq: Two times a day (BID) | CUTANEOUS | Status: DC

## 2013-05-25 MED ORDER — NYSTATIN 100000 UNIT/GM EX POWD: CUTANEOUS | Status: DC

## 2013-05-25 NOTE — Progress Notes (Signed)
Subjective:    Patient ID: Randall Reed, male    DOB: 10-12-1971, 42 y.o.   MRN: [id number]  HPI Randall Reed is a 42 y.o. male Here for multiple concerns tonight. Last ov with me April 2013.  PCP: Merla Riches. No appt scheduled at this point.   ? Reaction to cream - used Darene Lamer for men all over body except legs - 3 nights ago, turned red next am in inside of legs, and noticed a breakout on R underarm today. Itches/burns/sore.  Tx: lotion, baby ointment for diaper rash that has calamine lotion in it, then otc cortisone cream/cortisone 10 - felt worse wth cortisone 10. More sore. No fever. No mouth lesions, no genital lesions, no dyspnea/wheeze/sore throat or difficulty swallowing.   Med refills - xanax, singulair, and voltaren.  Takes xanax for anxiety or to sleep - usually once per day, but sometimes during the week - once per day for anxiety. Stable anxiety, no SI/HI. No depression sx's.. Stress at work, but feels anxiety is stable.   singulair for allergies also on allegra D and nasal sprays. Just needs RF of singulair.  Takes Voltatren for knee pain - arthritis in knees - has been seen by orhto - Dr Frazier Richards in [place], last seen in January. Takes Voltaren only as needed if flare of pain.   Also wants to discuss low testosterone - working out 6 days a week, feels like diet is controlled. Last testosterone 260 on 02/09/13 office visit. Has not had checked prior to April. Feels like not losing weight with exercise.   Results for orders placed in visit on 02/09/13  COMPREHENSIVE METABOLIC PANEL      Result Value Range   Sodium 140  135 - 145 mEq/L   Potassium 4.2  3.5 - 5.3 mEq/L   Chloride 104  96 - 112 mEq/L   CO2 26  19 - 32 mEq/L   Glucose, Bld 83  70 - 99 mg/dL   BUN 16  6 - 23 mg/dL   Creat 6.96  2.95 - 2.84 mg/dL   Total Bilirubin 0.6  0.3 - 1.2 mg/dL   Alkaline Phosphatase 94  39 - 117 U/L   AST 15  0 - 37 U/L   ALT 17  0 - 53 U/L   Total Protein 6.9  6.0 - 8.3 g/dL   Albumin 4.9  3.5 - 5.2 g/dL   Calcium 9.7  8.4 - 13.2 mg/dL  TSH      Result Value Range   TSH 2.321  0.350 - 4.500 uIU/mL  TESTOSTERONE      Result Value Range   Testosterone 260 (*) 300 - 890 ng/dL  LIPID PANEL      Result Value Range   Cholesterol 186  0 - 200 mg/dL   Triglycerides 440  <102 mg/dL   HDL 41  >72 mg/dL   Total CHOL/HDL Ratio 4.5     VLDL 24  0 - 40 mg/dL   LDL Cholesterol 536 (*) 0 - 99 mg/dL  POCT CBC      Result Value Range   WBC 6.4  4.6 - 10.2 K/uL   Lymph, poc 1.6  0.6 - 3.4   POC LYMPH PERCENT 24.6  10 - 50 %L   MID (cbc) 0.6  0 - 0.9   POC MID % 8.9  0 - 12 %M   POC Granulocyte 4.3  2 - 6.9   Granulocyte percent 66.5  37 - 80 %G  RBC 5.20  4.69 - 6.13 M/uL   Hemoglobin 15.3  14.1 - 18.1 g/dL   HCT, POC 16.1  09.6 - 53.7 %   MCV 89.4  80 - 97 fL   MCH, POC 29.4  27 - 31.2 pg   MCHC 32.9  31.8 - 35.4 g/dL   RDW, POC 04.5     Platelet Count, POC 240  142 - 424 K/uL   MPV 8.0  0 - 99.8 fL  POCT GLYCOSYLATED HEMOGLOBIN (HGB A1C)      Result Value Range   Hemoglobin A1C 4.8      Review of Systems  Constitutional: Negative for fever and chills.  Genitourinary: Negative for dysuria, difficulty urinating and genital sores.  Skin: Positive for rash.   As above.     Objective:   Physical Exam  Constitutional: He is oriented to person, place, and time. He appears well-developed and well-nourished. No distress.  HENT:  Mouth/Throat: Oropharynx is clear and moist.  Pulmonary/Chest: Effort normal and breath sounds normal.  Abdominal: Soft. There is no tenderness.  Genitourinary: Testes normal and penis normal.     Neurological: He is alert and oriented to person, place, and time.  Skin: Skin is warm. There is erythema.     Psychiatric: He has a normal mood and affect. His behavior is normal. Judgment and thought content normal.    Results for orders placed in visit on 05/25/13  POCT SKIN KOH      Result Value Range   Skin KOH, POC Positive          Assessment & Plan:  Randall Reed is a 42 y.o. male  Rash and nonspecific skin eruption, Candidal intertrigo  With possible secondary cellulitis. initial hx more likley contact derm/chemical irritant from hair removal application, but appears infected currently and positive KOH.  Wound culture obtained, nystatin (MYCOSTATIN/NYSTOP) 100000 UNIT/GM POWD during the day to keep area dry, clotrimazole (LOTRIMIN) 1 % cream BID, and doxycycline 100mg  bid.   Anxiety state, unspecified, with insomnia  - by chart, appears to have #90 of xanax Rx on 04/17/13. He will check into this, but also advised of controlled medication policy at [facility].  I can write for short term of this if needed until he can obtain follow up with Dr. Merla Riches.   Allergic rhinitis - on singulair - also appears to have refills available at pharmacy prescribed July 8th.   Low testosterone - discussed option of morning lab only visit to repeat, or endocrine eval with repeat testing, and d/t persistent difficulty losing weight, will refer to Endocrinology for eval.   Intermittent knee pain - refilled voltaren for prn use. Follow up with ortho if needed   Meds ordered this encounter  . doxycycline (VIBRA-TABS) 100 MG tablet    Sig: Take 1 tablet (100 mg total) by mouth 2 (two) times daily.    Dispense:  20 tablet    Refill:  0  . nystatin (MYCOSTATIN/NYSTOP) 100000 UNIT/GM POWD    Sig: Apply to affected area 3 times per day as needed.    Dispense:  30 g    Refill:  0  . clotrimazole (LOTRIMIN) 1 % cream    Sig: Apply topically 2 (two) times daily.    Dispense:  30 g    Refill:  0  . diclofenac (VOLTAREN) 75 MG EC tablet    Sig: Take 1 tablet (75 mg total) by mouth 2 (two) times daily.    Dispense:  60  tablet    Refill:  0    Order Specific Question:  Supervising Provider      Patient Instructions  Start the antibiotic - twice per day, cream twice per day and powder to area during day as needed. recheck in next  2-3 days. Return to the clinic or go to the nearest emergency room if any of your symptoms worsen or new symptoms occur.

## 2013-05-25 NOTE — Patient Instructions (Signed)
Start the antibiotic - twice per day, cream twice per day and powder to area during day as needed. recheck in next 2-3 days. Return to the clinic or go to the nearest emergency room if any of your symptoms worsen or new symptoms occur.

## 2013-05-26 MED ORDER — DICLOFENAC SODIUM 75 MG PO TBEC: 75.0000 mg | DELAYED_RELEASE_TABLET | Freq: Two times a day (BID) | ORAL | Status: DC

## 2013-05-28 LAB — WOUND CULTURE

## 2013-05-29 ENCOUNTER — Ambulatory Visit (INDEPENDENT_AMBULATORY_CARE_PROVIDER_SITE_OTHER): Payer: BC Managed Care – PPO | Admitting: Family Medicine

## 2013-05-29 VITALS — BP 120/76 | HR 68 | Temp 97.9°F | Resp 18 | Ht 71.5 in | Wt 220.0 lb

## 2013-05-29 DIAGNOSIS — B372 Candidiasis of skin and nail: Secondary | ICD-10-CM

## 2013-05-29 MED ORDER — NYSTATIN 100000 UNIT/GM EX POWD: CUTANEOUS | Status: DC

## 2013-05-29 NOTE — Progress Notes (Signed)
  Subjective:    Patient ID: Randall Reed, male    DOB: 08/31/1971, 42 y.o.   MRN: [id number]  HPI Randall Reed is a 42 y.o. male See ov 05/25/13 - here for follow up.   Candidal intertrigo  With possible secondary cellulitis. - started on clotrimazole BID, nystatin top powder and doxycycline BID.  Feels like it getting a little better  - not as inflamed. Still a few sore areas.  No fever.  No n/v. Ran out of nystatin.   Anxiety state, unspecified, with insomnia - did have the Rx, no refill needed.   Allergic rhinitis - on singulair -  refills available at pharmacy prescribed July 8th.   Intermittent knee pain - refilled voltaren for prn use. Follow up with ortho if needed    Review of Systems As above.     Objective:   Physical Exam  Vitals reviewed. Constitutional: He is oriented to person, place, and time. He appears well-developed and well-nourished. No distress.  HENT:  Head: Normocephalic and atraumatic.  Pulmonary/Chest: Effort normal.  Neurological: He is alert and oriented to person, place, and time.  Skin: Skin is warm and dry. Rash noted.         Assessment & Plan:  Randall Reed is a 42 y.o. male Candidal intertrigo - Plan: nystatin (MYCOSTATIN/NYSTOP) 100000 UNIT/GM POWD  Improved. Continue nystatin powder (refilled) - especially with workouts or time of sweating, clotrimazole, doxy for possible mild secondary cellulitits. recheck in next week if not continuing to improve.   Meds ordered this encounter  Medications  . nystatin (MYCOSTATIN/NYSTOP) 100000 UNIT/GM POWD    Sig: Apply to affected area 3 times per day as needed.    Dispense:  60 g    Refill:  1     Patient Instructions  Continue the cream, powder and antibiotic.  If not continuing to improve by next week - recheck. Return to the clinic or go to the nearest emergency room if any of your symptoms worsen or new symptoms occur.

## 2013-05-29 NOTE — Patient Instructions (Signed)
Continue the cream, powder and antibiotic.  If not continuing to improve by next week - recheck. Return to the clinic or go to the nearest emergency room if any of your symptoms worsen or new symptoms occur.

## 2013-06-03 ENCOUNTER — Other Ambulatory Visit: Payer: Self-pay | Admitting: Chiropractic Medicine

## 2013-06-03 DIAGNOSIS — M25511 Pain in right shoulder: Secondary | ICD-10-CM

## 2013-06-04 ENCOUNTER — Telehealth: Payer: Self-pay

## 2013-06-04 NOTE — Telephone Encounter (Signed)
PT HAS SEEN DR. Neva Seat A COUPLE OF TIMES ABOUT A RASH. HE SAYS THE RASH HAS NOT GONE AWAY AND WOULD LIKE TO KNOW WHAT TO DO. PLEASE CALL  [phone]

## 2013-06-05 NOTE — Telephone Encounter (Signed)
Patient indicates it is better, but not completely well, advised him to return to clinic for this.

## 2013-06-05 NOTE — Telephone Encounter (Signed)
  Continue the cream, powder and antibiotic. If not continuing to improve by next week - recheck. Return to the clinic or go to the nearest emergency room if any of your symptoms worsen or new symptoms occur.

## 2013-06-10 ENCOUNTER — Other Ambulatory Visit: Payer: Self-pay

## 2013-06-11 ENCOUNTER — Ambulatory Visit
Admission: RE | Admit: 2013-06-11 | Discharge: 2013-06-11 | Disposition: A | Discharge status: Home / Self Care | Payer: BC Managed Care – PPO | Source: Ambulatory Visit | Attending: Chiropractic Medicine | Admitting: Chiropractic Medicine

## 2013-06-11 DIAGNOSIS — M25511 Pain in right shoulder: Secondary | ICD-10-CM

## 2013-06-15 DIAGNOSIS — M7520 Bicipital tendinitis, unspecified shoulder: Secondary | ICD-10-CM | POA: Insufficient documentation

## 2013-06-15 DIAGNOSIS — M75110 Incomplete rotator cuff tear or rupture of unspecified shoulder, not specified as traumatic: Secondary | ICD-10-CM | POA: Insufficient documentation

## 2013-06-15 DIAGNOSIS — M79603 Pain in arm, unspecified: Secondary | ICD-10-CM | POA: Insufficient documentation

## 2013-06-17 ENCOUNTER — Ambulatory Visit (INDEPENDENT_AMBULATORY_CARE_PROVIDER_SITE_OTHER): Payer: BC Managed Care – PPO | Admitting: Endocrinology

## 2013-06-17 ENCOUNTER — Encounter: Payer: Self-pay | Admitting: Endocrinology

## 2013-06-17 VITALS — BP 126/76 | HR 75 | Temp 98.4°F | Resp 12 | Ht 71.5 in | Wt 218.6 lb

## 2013-06-17 DIAGNOSIS — E291 Testicular hypofunction: Secondary | ICD-10-CM

## 2013-06-17 DIAGNOSIS — R5381 Other malaise: Secondary | ICD-10-CM

## 2013-06-17 NOTE — Progress Notes (Signed)
Patient ID: Randall Reed, male   DOB: 1971-07-27, 42 y.o.   MRN: [id number]  Reason for consultation:? hypogonadism  History of Present Illness:  The patient has had difficulty with erectile dysfunction for the last year or so. He has been having difficulties with fatigue and decreased libido off and on especially since March of this year However he has had difficulties with getting adequate sleep for the last 3 years In the last few months he has also had increased stress also He does not feel he has any weakness and is able to work out with weights without any difficulty Does not complain of any depressed mood  In the last few years he has been able to get his weight down with diet and exercise He has been told to have a low testosterone level but review of his records indicate that his free testosterone was normal in 08/2012   He has not had any further evaluation including LH and prolactin levels    Lab Results  Component Value Date   TESTOSTERONE 260* 02/09/2013   Past Medical History  Diagnosis Date  . GERD (gastroesophageal reflux disease)   . Hypertension   . Anxiety   . Seasonal allergic reaction   . Depression   . Arthritis   . Asthma     Past Surgical History  Procedure Laterality Date  . None    . None      Family History  Problem Relation Age of Onset  . Hypertension    . Heart attack    . Cancer    . Breast cancer    . Hyperlipidemia    . Coronary artery disease Father 62    stents, CABG  . Heart attack Father   . Coronary artery disease Paternal Grandfather   . Heart attack Maternal Grandfather     Social History:  reports that he has never smoked. He does not have any smokeless tobacco history on file. He reports that he drinks about 1.5 ounces of alcohol per week. He reports that he does not use illicit drugs.  Allergies:  Allergies  Allergen Reactions  . Desloratadine Other (See Comments)    CLARINEX-"severe headache"  . Hydrocod Polst-Cpm  Polst Er Itching  . Loratadine Other (See Comments)    "severe headache"  . Telithromycin   . Tussionex Pennkinetic Er [Hydrocod Polst-Cpm Polst Er] Itching  . Levbid [Hyoscyamine Sulfate] Rash      Medication List       This list is accurate as of: 06/17/13  3:08 PM.  Always use your most recent med list.               acetic acid-hydrocortisone otic solution  Commonly known as:  VOSOL-HC  Place 5 drops into the right ear 3 (three) times daily.     albuterol 108 (90 BASE) MCG/ACT inhaler  Commonly known as:  PROVENTIL HFA;VENTOLIN HFA  Inhale 2 puffs into the lungs every 6 (six) hours as needed.     ALPRAZolam 1 MG tablet  Commonly known as:  XANAX  TAKE 1 TABLET BY MOUTH THREE TIMES DAILY AS NEEDED     ATROVENT HFA 17 MCG/ACT inhaler  Generic drug:  ipratropium  INHALE 2 PUFFS INTO THE LUNGS TWICE DAILY OR AS DIRECTED 1 INHALER TO LAST 30 DAYS     betamethasone 0.6 MG/5ML solution  Commonly known as:  CELESTONE  Take 9 mg by mouth 2 (two) times daily.     clidinium-chlordiazePOXIDE 2.5-5  MG per capsule  Commonly known as:  LIBRAX  Take 1 capsule by mouth 2 (two) times daily.     clotrimazole 1 % cream  Commonly known as:  LOTRIMIN  Apply topically 2 (two) times daily.     diclofenac 75 MG EC tablet  Commonly known as:  VOLTAREN  Take 1 tablet (75 mg total) by mouth 2 (two) times daily.     doxycycline 100 MG tablet  Commonly known as:  VIBRA-TABS  Take 1 tablet (100 mg total) by mouth 2 (two) times daily.     esomeprazole 40 MG capsule  Commonly known as:  NEXIUM  Take 1 capsule (40 mg total) by mouth daily before breakfast.     fexofenadine 180 MG tablet  Commonly known as:  ALLEGRA  Take 180 mg by mouth daily.     fluticasone 50 MCG/ACT nasal spray  Commonly known as:  FLONASE  Place 2 sprays into the nose daily.     hydrocortisone 2.5 % rectal cream  Commonly known as:  ANUSOL-HC  Place rectally 2 (two) times daily.     ipratropium 0.06 %  nasal spray  Commonly known as:  ATROVENT  Place 2 sprays into the nose 2 (two) times daily.     ketoprofen 75 MG capsule  Commonly known as:  ORUDIS  Take 75 mg by mouth 4 (four) times daily as needed.     montelukast 10 MG tablet  Commonly known as:  SINGULAIR  TAKE 1 TABLET BY MOUTH EVERY NIGHT AT BEDTIME     naproxen sodium 220 MG tablet  Commonly known as:  ANAPROX  Take 220 mg by mouth 2 (two) times daily with a meal. 2 tablets twice a day     nystatin 100000 UNIT/GM Powd  Apply to affected area 3 times per day as needed.     promethazine-dextromethorphan 6.25-15 MG/5ML syrup  Commonly known as:  PROMETHAZINE-DM  Take 5 mLs by mouth 4 (four) times daily as needed for cough.     rizatriptan 10 MG tablet  Commonly known as:  MAXALT  Take 1 tablet (10 mg total) by mouth 2 (two) times daily as needed. May repeat in 2 hours if needed for migraine     MAXALT-MLT 10 MG disintegrating tablet  Generic drug:  rizatriptan  AS DIRECTED     sodium chloride 0.9 % SOLN with hydrocortisone sodium succinate 100 mg/2 mL SOLR 10 mg/mL  Inject into the muscle continuous.     tadalafil 20 MG tablet  Commonly known as:  CIALIS  Take one half to one tablet one hour prior to intercourse up to every 48 hours     zolpidem 10 MG tablet  Commonly known as:  AMBIEN  TAKE 1 TABLET BY MOUTH ONCE DAILY AT BEDTIME AS NEEDED FOR SLEEP          REVIEW OF SYSTEMS:           No  recent headaches. has history of migraine    He has history of allergic rhinitis     Skin: No rash      Thyroid:  No cold or heat intolerance, unusual fatigue.     His blood pressure has been normal.    No history of diabetes. Last A1c was 4.8 in April      No swelling of feet.     No shortness of breath on exertion.history of asthma      Bowel habits:  Variable, has history of IBS.  Has history of heartburn at times       Rectal bleeding/black stools: Not present.       No frequency of urination,  nocturia, weak stream.      No joint  Pains. He has had some problems with rotator cuff injury       No  depression or anxiety. does take Ambien for sleep without much success    PHYSICAL EXAM:  BP 126/76  Pulse 75  Temp(Src) 98.4 F (36.9 C)  Resp 12  Ht 5' 11.5" (1.816 m)  Wt 218 lb 9.6 oz (99.156 kg)  BMI 30.07 kg/m2  SpO2 97%  GENERAL: Mild generalized obesity present  No pallor, clubbing, lymphadenopathy or edema.  Skin:  no rash or pigmentation.  EYES:  Externally normal.  Fundii:  normal discs and vessels.  ENT: Oral mucosa and tongue normal.  THYROID:  Not palpable.  CAROTIDS:  Normal character; no bruit.  HEART:  Normal apex, S1 and S2; no murmur or click.  CHEST:  Normal shape, no gynecomastia present. Lungs: Vescicular breath sounds heard equally.  No crepitations/ wheeze.  ABDOMEN:  No distention.  Liver and spleen not palpable.  No other mass or tenderness.  Genital exam: Testicles normal size bilaterally  NEUROLOGICAL: Biceps reflexes are normal  JOINTS:  Normal.   ASSESSMENT:   Nonspecific symptoms of fatigue, decreased libido and erectile dysfunction Patient does have some obesity although not at his maximum weight  Although he does have strong family history of diabetes he does not appear to have any other features of metabolic syndrome including hyperglycemia, dyslipidemia or hypertension  His fatigue may or may not be related to his insomnia as well as stress; discussed that this may also be related to the symptoms of his decreased libido and erectile dysfunction  His examination is quite normal without any evidence of gynecomastia or smaller testicular size  He did have normal free testosterone last year even though total testosterone was relatively low and this is related to  low SHBG levels  PLAN:   Check fasting free testosterone, prolactin, free T4 and LH levels Discussed insulin resistance syndrome and need for weight loss if he has  hypogonadotropic hypogonadism If his free testosterone is low with normal LH will consider supplementation  Takyah Ciaramitaro 06/17/2013, 3:08 PM

## 2013-06-22 ENCOUNTER — Other Ambulatory Visit (INDEPENDENT_AMBULATORY_CARE_PROVIDER_SITE_OTHER): Payer: BC Managed Care – PPO

## 2013-06-22 ENCOUNTER — Other Ambulatory Visit: Payer: Self-pay | Admitting: Endocrinology

## 2013-06-22 ENCOUNTER — Other Ambulatory Visit: Payer: Self-pay | Admitting: *Deleted

## 2013-06-22 DIAGNOSIS — E291 Testicular hypofunction: Secondary | ICD-10-CM

## 2013-06-22 DIAGNOSIS — E785 Hyperlipidemia, unspecified: Secondary | ICD-10-CM

## 2013-06-22 DIAGNOSIS — E039 Hypothyroidism, unspecified: Secondary | ICD-10-CM

## 2013-06-22 LAB — T4, FREE: Free T4: 1.13 ng/dL (ref 0.60–1.60)

## 2013-06-23 LAB — TESTOSTERONE, FREE, TOTAL, SHBG
Sex Hormone Binding: 16 nmol/L (ref 13–71)
Testosterone, Free: 71.7 pg/mL (ref 47.0–244.0)
Testosterone-% Free: 2.8 % (ref 1.6–2.9)

## 2013-07-30 ENCOUNTER — Encounter: Payer: Self-pay | Admitting: Endocrinology

## 2013-07-30 ENCOUNTER — Other Ambulatory Visit: Payer: Self-pay | Admitting: *Deleted

## 2013-07-30 ENCOUNTER — Ambulatory Visit (INDEPENDENT_AMBULATORY_CARE_PROVIDER_SITE_OTHER): Payer: BC Managed Care – PPO | Admitting: Endocrinology

## 2013-07-30 VITALS — BP 118/78 | HR 90 | Temp 98.4°F | Resp 12 | Ht 71.5 in | Wt 215.9 lb

## 2013-07-30 DIAGNOSIS — E291 Testicular hypofunction: Secondary | ICD-10-CM

## 2013-07-30 DIAGNOSIS — N529 Male erectile dysfunction, unspecified: Secondary | ICD-10-CM

## 2013-07-30 DIAGNOSIS — Z23 Encounter for immunization: Secondary | ICD-10-CM

## 2013-07-30 DIAGNOSIS — R7989 Other specified abnormal findings of blood chemistry: Secondary | ICD-10-CM

## 2013-07-30 MED ORDER — TESTOSTERONE 4 MG/24HR TD PT24: 1.0000 | MEDICATED_PATCH | Freq: Every day | TRANSDERMAL | Status: DC

## 2013-07-30 NOTE — Patient Instructions (Signed)
Androderm 4mg  daily

## 2013-07-30 NOTE — Progress Notes (Signed)
Patient ID: Randall Reed, male   DOB: January 17, 1971, 42 y.o.   MRN: [id number]  Chief complaint: Fatigue  History of Present Illness:  Previous history: The patient has had difficulty with erectile dysfunction for the last year or so. Also has had fatigue and decreased libido off and on especially since March of this year In the last few years he has been able to get his weight down with diet and exercise He has been told to have a low testosterone level but review of his records indicate that his free testosterone was normal in 08/2012   Recent history: He still has occasional days where he gets tired and also has decreased libido. He does not think he has consistent difficulties with sleep now which he was having previously. Does not think he has unusual stress or depressed mood although on the last visit mentioned increased stress He is working out 5 days a week, twice a day and has no difficulties with stamina or muscle strength   No visits with results within 1 Month(s) from this visit. Latest known visit with results is:  Lab on 06/22/2013  Component Date Value Range Status  . TSH 06/22/2013 1.39  0.35 - 5.50 uIU/mL Final  . Free T4 06/22/2013 1.13  0.60 - 1.60 ng/dL Final  . Testosterone 40/98/1191 260* 300 - 890 ng/dL Final   Comment:           Tanner Stage       Male              Male                                        I              < 30 ng/dL        < 10 ng/dL                                        II             < 150 ng/dL       < 30 ng/dL                                        III            100-320 ng/dL     < 35 ng/dL                                        IV             200-970 ng/dL     47-82 ng/dL                                        V/Adult        300-890 ng/dL     95-62 ng/dL                             .  Sex Hormone Binding 06/22/2013 16  13 - 71 nmol/L Final  . Testosterone, Free 06/22/2013 71.7  47.0 - 244.0 pg/mL Final   Comment:                             The concentration of free testosterone is derived from a mathematical                          expression based on constants for the binding of testosterone to sex                          hormone-binding globulin and albumin.  . Testosterone-% Freee. 06/22/2013 2.8  1.6 - 2.9 % Final    Past Medical History  Diagnosis Date  . GERD (gastroesophageal reflux disease)   . Hypertension   . Anxiety   . Seasonal allergic reaction   . Depression   . Arthritis   . Asthma     Past Surgical History  Procedure Laterality Date  . None    . None      Family History  Problem Relation Age of Onset  . Hypertension    . Heart attack    . Cancer    . Breast cancer    . Hyperlipidemia    . Coronary artery disease Father 42    stents, CABG  . Heart attack Father   . Coronary artery disease Paternal Grandfather   . Heart attack Maternal Grandfather     Social History:  reports that he has never smoked. He does not have any smokeless tobacco history on file. He reports that he drinks about 1.5 ounces of alcohol per week. He reports that he does not use illicit drugs.  Allergies:  Allergies  Allergen Reactions  . Desloratadine Other (See Comments)    CLARINEX-"severe headache"  . Hydrocod Polst-Cpm Polst Er Itching  . Loratadine Other (See Comments)    "severe headache"  . Telithromycin   . Tussionex Pennkinetic Er [Hydrocod Polst-Cpm Polst Er] Itching  . Levbid [Hyoscyamine Sulfate] Rash      Medication List       This list is accurate as of: 07/30/13  8:23 AM.  Always use your most recent med list.               acetic acid-hydrocortisone otic solution  Commonly known as:  VOSOL-HC  Place 5 drops into the right ear 3 (three) times daily.     albuterol 108 (90 BASE) MCG/ACT inhaler  Commonly known as:  PROVENTIL HFA;VENTOLIN HFA  Inhale 2 puffs into the lungs every 6 (six) hours as needed.     ALPRAZolam 1 MG tablet  Commonly known as:  XANAX  TAKE 1 TABLET BY MOUTH  THREE TIMES DAILY AS NEEDED     ATROVENT HFA 17 MCG/ACT inhaler  Generic drug:  ipratropium  INHALE 2 PUFFS INTO THE LUNGS TWICE DAILY OR AS DIRECTED 1 INHALER TO LAST 30 DAYS     betamethasone 0.6 MG/5ML solution  Commonly known as:  CELESTONE  Take 9 mg by mouth 2 (two) times daily.     clidinium-chlordiazePOXIDE 2.5-5 MG per capsule  Commonly known as:  LIBRAX  Take 1 capsule by mouth 2 (two) times daily.     clotrimazole 1 % cream  Commonly known as:  LOTRIMIN  Apply topically 2 (two) times daily.  diclofenac 75 MG EC tablet  Commonly known as:  VOLTAREN  Take 1 tablet (75 mg total) by mouth 2 (two) times daily.     doxycycline 100 MG tablet  Commonly known as:  VIBRA-TABS  Take 1 tablet (100 mg total) by mouth 2 (two) times daily.     esomeprazole 40 MG capsule  Commonly known as:  NEXIUM  Take 1 capsule (40 mg total) by mouth daily before breakfast.     fexofenadine 180 MG tablet  Commonly known as:  ALLEGRA  Take 180 mg by mouth daily.     fluticasone 50 MCG/ACT nasal spray  Commonly known as:  FLONASE  Place 2 sprays into the nose daily.     hydrocortisone 2.5 % rectal cream  Commonly known as:  ANUSOL-HC  Place rectally 2 (two) times daily.     ipratropium 0.06 % nasal spray  Commonly known as:  ATROVENT  Place 2 sprays into the nose 2 (two) times daily.     ketoprofen 75 MG capsule  Commonly known as:  ORUDIS  Take 75 mg by mouth 4 (four) times daily as needed.     montelukast 10 MG tablet  Commonly known as:  SINGULAIR  TAKE 1 TABLET BY MOUTH EVERY NIGHT AT BEDTIME     naproxen sodium 220 MG tablet  Commonly known as:  ANAPROX  Take 220 mg by mouth 2 (two) times daily with a meal. 2 tablets twice a day     nitroGLYCERIN 0.1 mg/hr patch  Commonly known as:  NITRODUR - Dosed in mg/24 hr  Place 1 patch onto the skin daily.     nystatin 100000 UNIT/GM Powd  Apply to affected area 3 times per day as needed.     promethazine-dextromethorphan  6.25-15 MG/5ML syrup  Commonly known as:  PROMETHAZINE-DM  Take 5 mLs by mouth 4 (four) times daily as needed for cough.     rizatriptan 10 MG tablet  Commonly known as:  MAXALT  Take 1 tablet (10 mg total) by mouth 2 (two) times daily as needed. May repeat in 2 hours if needed for migraine     MAXALT-MLT 10 MG disintegrating tablet  Generic drug:  rizatriptan  AS DIRECTED     sodium chloride 0.9 % SOLN with hydrocortisone sodium succinate 100 mg/2 mL SOLR 10 mg/mL  Inject into the muscle continuous.     tadalafil 20 MG tablet  Commonly known as:  CIALIS  Take one half to one tablet one hour prior to intercourse up to every 48 hours     zolpidem 10 MG tablet  Commonly known as:  AMBIEN  TAKE 1 TABLET BY MOUTH ONCE DAILY AT BEDTIME AS NEEDED FOR SLEEP          REVIEW OF SYSTEMS:        . He has had some problems with rotator cuff injury     Has tried Ambien for sleep   PHYSICAL EXAM:  BP 118/78  Pulse 90  Temp(Src) 98.4 F (36.9 C)  Resp 12  Ht 5' 11.5" (1.816 m)  Wt 215 lb 14.4 oz (97.932 kg)  BMI 29.7 kg/m2  SpO2 97%    ASSESSMENT/PLAN:   Nonspecific symptoms of intermittent fatigue, decreased libido and erectile dysfunction Free T4 is normal ruling out secondary hypothyroidism Patient may have insulin resistance but BMI is under 30. He does not appear to have any other features of metabolic syndrome including hyperglycemia, dyslipidemia or hypertension  His fatigue is intermittent  and nonspecific, may be related to his insomnia as well as stress; discussed that this may also causing symptoms of his decreased libido and erectile dysfunction However he is still feeling that he has symptoms of low testosterone despite the normal free testosterone levels now and also in 2013  Will give him an empiric trial of Androderm 4 mg daily and recheck testosterone levels in about a month's period If he is subjectively not improved will stop the  supplementation Encouraged him to continue exercise   [facility] 07/30/2013, 8:23 AM

## 2013-07-31 ENCOUNTER — Other Ambulatory Visit: Payer: Self-pay | Admitting: *Deleted

## 2013-07-31 MED ORDER — TESTOSTERONE 4 MG/24HR TD PT24: 1.0000 | MEDICATED_PATCH | Freq: Every day | TRANSDERMAL | Status: DC

## 2013-08-03 ENCOUNTER — Other Ambulatory Visit: Payer: Self-pay | Admitting: *Deleted

## 2013-08-03 MED ORDER — TESTOSTERONE 20.25 MG/1.25GM (1.62%) TD GEL: 1.6200 mg | Freq: Every day | TRANSDERMAL | Status: DC

## 2013-08-12 ENCOUNTER — Telehealth: Payer: Self-pay

## 2013-08-12 MED ORDER — ZOLPIDEM TARTRATE 10 MG PO TABS: ORAL_TABLET | ORAL | Status: DC

## 2013-08-12 NOTE — Telephone Encounter (Signed)
Dr Emilia Beck, Pharm requests RF of zolpidem 10 mg from you. Dr Neva Seat saw pt in July but only got a RF of alprazolam to hold him until he could get back for f/up with you. Do you want to RF the zolpidem or pt RTC first?

## 2013-08-12 NOTE — Telephone Encounter (Signed)
Meds ordered this encounter  Medications  . zolpidem (AMBIEN) 10 MG tablet    Sig: TAKE 1 TABLET BY MOUTH ONCE DAILY AT BEDTIME AS NEEDED FOR SLEEP    Dispense:  30 tablet    Refill:  5

## 2013-08-28 ENCOUNTER — Ambulatory Visit: Payer: BC Managed Care – PPO | Admitting: Endocrinology

## 2013-09-02 ENCOUNTER — Other Ambulatory Visit: Payer: Self-pay | Admitting: Endocrinology

## 2013-09-02 ENCOUNTER — Other Ambulatory Visit: Payer: Self-pay | Admitting: *Deleted

## 2013-09-02 ENCOUNTER — Other Ambulatory Visit: Payer: BC Managed Care – PPO

## 2013-09-04 ENCOUNTER — Encounter: Payer: Self-pay | Admitting: Endocrinology

## 2013-09-04 ENCOUNTER — Ambulatory Visit (INDEPENDENT_AMBULATORY_CARE_PROVIDER_SITE_OTHER): Payer: BC Managed Care – PPO | Admitting: Endocrinology

## 2013-09-04 VITALS — BP 122/78 | HR 84 | Temp 98.5°F | Resp 12 | Ht 71.5 in | Wt 218.3 lb

## 2013-09-04 DIAGNOSIS — E291 Testicular hypofunction: Secondary | ICD-10-CM | POA: Insufficient documentation

## 2013-09-04 NOTE — Progress Notes (Signed)
Patient ID: Randall Reed, male   DOB: [birth year], 42 y.o.   MRN: [id number]  Chief complaint:  followup of hypogonadism  History of Present Illness:  Previous history: The patient has had difficulty with erectile dysfunction since about 2013 Also has had fatigue and decreased libido off and on especially since 3/14 In the last few years he has been able to get his weight down with diet and exercise; baseline weight was about 290 pounds He has been told to have a low testosterone level but review of his records indicate that his free testosterone was normal in 08/2012   Recent history: on his last visit he was complaining of occasional fatigue and on some days he would be feeling exhausted.  He also had decreased libido.  His free testosterone level was in the normal range at 72 although his total testosterone was slightly low at 260 Because of above symptoms he was requesting a trial of testosterone supplements He was given Androderm but his insurance made him try AndroGel which he is doing every morning with one pump on each arm He had some questions about the application technique  He started noticing better libido in about a week and feels satisfied with this now. Also his energy level is getting better although on some days he still feels very tired Does not think he has decreased muscle strength and he is still working out 5 days a week, twice a day with significant amount of aerobic activity   He does not think he has consistent difficulties with sleep now which he was having previously. Does not think he has unusual stress or depressed mood although on the last visit mentioned increased stress  LAB  Orders Only on 09/02/2013  Component Date Value Range Status  . Testosterone 09/02/2013 497  300 - 890 ng/dL Final   Comment:           Tanner Stage       Male              Male                                        I              < 30 ng/dL        < 10 ng/dL                                         II             < 150 ng/dL       < 30 ng/dL                                        III            100-320 ng/dL     < 35 ng/dL                                        IV             200-970 ng/dL  15-40 ng/dL                                        V/Adult        300-890 ng/dL     16-10 ng/dL                               Past Medical History  Diagnosis Date  . GERD (gastroesophageal reflux disease)   . Hypertension   . Anxiety   . Seasonal allergic reaction   . Depression   . Arthritis   . Asthma     Past Surgical History  Procedure Laterality Date  . None    . None      Family History  Problem Relation Age of Onset  . Hypertension    . Heart attack    . Cancer    . Breast cancer    . Hyperlipidemia    . Coronary artery disease Father 82    stents, CABG  . Heart attack Father   . Coronary artery disease Paternal Grandfather   . Heart attack Maternal Grandfather     Social History:  reports that he has never smoked. He does not have any smokeless tobacco history on file. He reports that he drinks about 1.5 ounces of alcohol per week. He reports that he does not use illicit drugs.  Allergies:  Allergies  Allergen Reactions  . Desloratadine Other (See Comments)    CLARINEX-"severe headache"  . Hydrocod Polst-Cpm Polst Er Itching  . Loratadine Other (See Comments)    "severe headache"  . Telithromycin   . Tussionex Pennkinetic Er [Hydrocod Polst-Cpm Polst Er] Itching  . Levbid [Hyoscyamine Sulfate] Rash      Medication List       This list is accurate as of: 09/04/13  8:34 AM.  Always use your most recent med list.               acetic acid-hydrocortisone otic solution  Commonly known as:  VOSOL-HC  Place 5 drops into the right ear 3 (three) times daily.     albuterol 108 (90 BASE) MCG/ACT inhaler  Commonly known as:  PROVENTIL HFA;VENTOLIN HFA  Inhale 2 puffs into the lungs every 6 (six) hours as needed.     ALPRAZolam 1 MG tablet   Commonly known as:  XANAX  TAKE 1 TABLET BY MOUTH THREE TIMES DAILY AS NEEDED     ATROVENT HFA 17 MCG/ACT inhaler  Generic drug:  ipratropium  INHALE 2 PUFFS INTO THE LUNGS TWICE DAILY OR AS DIRECTED 1 INHALER TO LAST 30 DAYS     betamethasone 0.6 MG/5ML solution  Commonly known as:  CELESTONE  Take 9 mg by mouth 2 (two) times daily.     clidinium-chlordiazePOXIDE 5-2.5 MG per capsule  Commonly known as:  LIBRAX  Take 1 capsule by mouth 2 (two) times daily.     clotrimazole 1 % cream  Commonly known as:  LOTRIMIN  Apply topically 2 (two) times daily.     diclofenac 75 MG EC tablet  Commonly known as:  VOLTAREN  Take 1 tablet (75 mg total) by mouth 2 (two) times daily.     doxycycline 100 MG tablet  Commonly known as:  VIBRA-TABS  Take 1 tablet (100 mg total) by mouth 2 (two) times daily.  esomeprazole 40 MG capsule  Commonly known as:  NEXIUM  Take 1 capsule (40 mg total) by mouth daily before breakfast.     fexofenadine 180 MG tablet  Commonly known as:  ALLEGRA  Take 180 mg by mouth daily.     fluticasone 50 MCG/ACT nasal spray  Commonly known as:  FLONASE  Place 2 sprays into the nose daily.     hydrocortisone 2.5 % rectal cream  Commonly known as:  ANUSOL-HC  Place rectally 2 (two) times daily.     ipratropium 0.06 % nasal spray  Commonly known as:  ATROVENT  Place 2 sprays into the nose 2 (two) times daily.     ketoprofen 75 MG capsule  Commonly known as:  ORUDIS  Take 75 mg by mouth 4 (four) times daily as needed.     montelukast 10 MG tablet  Commonly known as:  SINGULAIR  TAKE 1 TABLET BY MOUTH EVERY NIGHT AT BEDTIME     naproxen sodium 220 MG tablet  Commonly known as:  ANAPROX  Take 220 mg by mouth 2 (two) times daily with a meal. 2 tablets twice a day     nitroGLYCERIN 0.1 mg/hr patch  Commonly known as:  NITRODUR - Dosed in mg/24 hr  Place 1 patch onto the skin daily.     nystatin 100000 UNIT/GM Powd  Apply to affected area 3 times per  day as needed.     promethazine-dextromethorphan 6.25-15 MG/5ML syrup  Commonly known as:  PROMETHAZINE-DM  Take 5 mLs by mouth 4 (four) times daily as needed for cough.     rizatriptan 10 MG tablet  Commonly known as:  MAXALT  Take 1 tablet (10 mg total) by mouth 2 (two) times daily as needed. May repeat in 2 hours if needed for migraine     MAXALT-MLT 10 MG disintegrating tablet  Generic drug:  rizatriptan  AS DIRECTED     sodium chloride 0.9 % SOLN with hydrocortisone sodium succinate 100 mg/2 mL SOLR 10 mg/mL  Inject into the muscle continuous.     tadalafil 20 MG tablet  Commonly known as:  CIALIS  Take one half to one tablet one hour prior to intercourse up to every 48 hours     Testosterone 20.25 MG/1.25GM (1.62%) Gel  Commonly known as:  ANDROGEL  Place 1.62 mg onto the skin daily. Apply 1 pump on each arm daily     zolpidem 10 MG tablet  Commonly known as:  AMBIEN  TAKE 1 TABLET BY MOUTH ONCE DAILY AT BEDTIME AS NEEDED FOR SLEEP          REVIEW OF SYSTEMS:        . He has had some problems with rotator cuff injury     Has tried Ambien for sleep  Free T4 was normal ruling out secondary hypothyroidism  PHYSICAL EXAM:  BP 122/78  Pulse 84  Temp(Src) 98.5 F (36.9 C)  Resp 12  Ht 5' 11.5" (1.816 m)  Wt 218 lb 4.8 oz (99.02 kg)  BMI 30.03 kg/m2  SpO2 97%    ASSESSMENT/PLAN:   Secondary hypogonadism, mild with symptoms of intermittent fatigue, decreased libido and erectile dysfunction  He does not appear to have any other features of metabolic syndrome including hyperglycemia, dyslipidemia or hypertension  He is subjectively improving with testosterone supplementation and his total testosterone level is significantly better  Given him detailed information on how to use AndroGel. Will continue to monitor his testosterone level and adjust the  dose as needed   Randall Reed 09/04/2013, 8:34 AM

## 2013-09-06 ENCOUNTER — Other Ambulatory Visit: Payer: Self-pay | Admitting: Physician Assistant

## 2013-09-06 ENCOUNTER — Other Ambulatory Visit: Payer: Self-pay | Admitting: Family Medicine

## 2013-09-06 NOTE — Patient Instructions (Signed)
Continue same dose

## 2013-09-08 ENCOUNTER — Other Ambulatory Visit: Payer: Self-pay

## 2013-09-08 DIAGNOSIS — J309 Allergic rhinitis, unspecified: Secondary | ICD-10-CM

## 2013-09-08 DIAGNOSIS — K219 Gastro-esophageal reflux disease without esophagitis: Secondary | ICD-10-CM

## 2013-09-08 MED ORDER — ESOMEPRAZOLE MAGNESIUM 40 MG PO CPDR: 40.0000 mg | DELAYED_RELEASE_CAPSULE | Freq: Every day | ORAL | Status: DC

## 2013-09-08 MED ORDER — IPRATROPIUM BROMIDE 0.06 % NA SOLN: 2.0000 | Freq: Two times a day (BID) | NASAL | Status: DC

## 2013-09-08 MED ORDER — FLUTICASONE PROPIONATE 50 MCG/ACT NA SUSP: 2.0000 | Freq: Every day | NASAL | Status: DC

## 2013-09-10 ENCOUNTER — Other Ambulatory Visit: Payer: Self-pay

## 2013-09-30 ENCOUNTER — Other Ambulatory Visit: Payer: Self-pay | Admitting: Endocrinology

## 2013-09-30 ENCOUNTER — Other Ambulatory Visit: Payer: Self-pay | Admitting: Physician Assistant

## 2013-10-13 ENCOUNTER — Other Ambulatory Visit: Payer: Self-pay | Admitting: Emergency Medicine

## 2013-10-13 DIAGNOSIS — N529 Male erectile dysfunction, unspecified: Secondary | ICD-10-CM

## 2013-10-14 NOTE — Telephone Encounter (Signed)
Dr Neva Seat, you last saw this pt here for his low T and referred him to endo. Do you want to RF his cialis or do we need for endo to manage this as well as hypogonadism?

## 2013-10-14 NOTE — Telephone Encounter (Signed)
I initially sent in a refill, but cancelled it when noted Nitroglycerin patch on med record, and would be concerned with combining these. Is he taking nitroglycerin patch?

## 2013-10-15 NOTE — Telephone Encounter (Signed)
LMOM to CB. 

## 2013-10-15 NOTE — Telephone Encounter (Signed)
Pt called back and said that he is not using the patch.  He has it if he needs though.  Says its been about a month since he used it

## 2013-10-17 ENCOUNTER — Ambulatory Visit (INDEPENDENT_AMBULATORY_CARE_PROVIDER_SITE_OTHER): Payer: BC Managed Care – PPO | Admitting: Internal Medicine

## 2013-10-17 VITALS — BP 134/80 | HR 82 | Temp 98.2°F | Resp 18 | Ht 70.5 in | Wt 213.4 lb

## 2013-10-17 DIAGNOSIS — K219 Gastro-esophageal reflux disease without esophagitis: Secondary | ICD-10-CM

## 2013-10-17 DIAGNOSIS — J309 Allergic rhinitis, unspecified: Secondary | ICD-10-CM

## 2013-10-17 DIAGNOSIS — K589 Irritable bowel syndrome without diarrhea: Secondary | ICD-10-CM

## 2013-10-17 DIAGNOSIS — M25519 Pain in unspecified shoulder: Secondary | ICD-10-CM

## 2013-10-17 DIAGNOSIS — I10 Essential (primary) hypertension: Secondary | ICD-10-CM

## 2013-10-17 DIAGNOSIS — F411 Generalized anxiety disorder: Secondary | ICD-10-CM

## 2013-10-17 DIAGNOSIS — M25569 Pain in unspecified knee: Secondary | ICD-10-CM

## 2013-10-17 DIAGNOSIS — N529 Male erectile dysfunction, unspecified: Secondary | ICD-10-CM

## 2013-10-17 DIAGNOSIS — M25511 Pain in right shoulder: Secondary | ICD-10-CM

## 2013-10-17 DIAGNOSIS — G43909 Migraine, unspecified, not intractable, without status migrainosus: Secondary | ICD-10-CM

## 2013-10-17 DIAGNOSIS — J45909 Unspecified asthma, uncomplicated: Secondary | ICD-10-CM

## 2013-10-17 DIAGNOSIS — Z209 Contact with and (suspected) exposure to unspecified communicable disease: Secondary | ICD-10-CM

## 2013-10-17 DIAGNOSIS — G4700 Insomnia, unspecified: Secondary | ICD-10-CM

## 2013-10-17 LAB — HEPATITIS B SURFACE ANTIBODY, QUANTITATIVE: Hepatitis B-Post: 0.1 m[IU]/mL

## 2013-10-17 LAB — HEPATITIS C ANTIBODY: HCV Ab: NEGATIVE

## 2013-10-17 LAB — HEPATITIS B SURFACE ANTIGEN: Hepatitis B Surface Ag: NEGATIVE

## 2013-10-17 MED ORDER — DICLOFENAC SODIUM 75 MG PO TBEC: 75.0000 mg | DELAYED_RELEASE_TABLET | Freq: Two times a day (BID) | ORAL | Status: DC

## 2013-10-17 MED ORDER — EMTRICITABINE-TENOFOVIR DF 200-300 MG PO TABS: 1.0000 | ORAL_TABLET | Freq: Every day | ORAL | Status: DC

## 2013-10-17 MED ORDER — TADALAFIL 20 MG PO TABS: ORAL_TABLET | ORAL | Status: DC

## 2013-10-17 MED ORDER — CILIDINIUM-CHLORDIAZEPOXIDE 2.5-5 MG PO CAPS: 1.0000 | ORAL_CAPSULE | Freq: Two times a day (BID) | ORAL | Status: DC

## 2013-10-17 MED ORDER — KETOPROFEN 75 MG PO CAPS: 75.0000 mg | ORAL_CAPSULE | Freq: Four times a day (QID) | ORAL | Status: DC | PRN

## 2013-10-17 MED ORDER — ALPRAZOLAM 1 MG PO TABS: ORAL_TABLET | ORAL | Status: DC

## 2013-10-17 MED ORDER — FLUTICASONE PROPIONATE 50 MCG/ACT NA SUSP: 2.0000 | Freq: Every day | NASAL | Status: DC

## 2013-10-17 MED ORDER — RIZATRIPTAN BENZOATE 10 MG PO TBDP: ORAL_TABLET | ORAL | Status: DC

## 2013-10-17 MED ORDER — ESOMEPRAZOLE MAGNESIUM 40 MG PO CPDR: 40.0000 mg | DELAYED_RELEASE_CAPSULE | Freq: Every day | ORAL | Status: DC

## 2013-10-17 MED ORDER — IPRATROPIUM BROMIDE HFA 17 MCG/ACT IN AERS: INHALATION_SPRAY | RESPIRATORY_TRACT | Status: DC

## 2013-10-17 MED ORDER — MONTELUKAST SODIUM 10 MG PO TABS: ORAL_TABLET | ORAL | Status: DC

## 2013-10-17 MED ORDER — FEXOFENADINE-PSEUDOEPHED ER 180-240 MG PO TB24: 1.0000 | ORAL_TABLET | Freq: Every day | ORAL | Status: DC

## 2013-10-17 MED ORDER — ALBUTEROL SULFATE HFA 108 (90 BASE) MCG/ACT IN AERS: 2.0000 | INHALATION_SPRAY | Freq: Four times a day (QID) | RESPIRATORY_TRACT | Status: DC | PRN

## 2013-10-17 MED ORDER — ZOLPIDEM TARTRATE 10 MG PO TABS: ORAL_TABLET | ORAL | Status: DC

## 2013-10-17 MED ORDER — IPRATROPIUM BROMIDE 0.06 % NA SOLN: 2.0000 | Freq: Two times a day (BID) | NASAL | Status: DC

## 2013-10-17 NOTE — Progress Notes (Addendum)
Subjective:  This chart was scribed for Randall Sia, MD by [name], Medical Scribe. This patient was seen in Room 9 and the patient's care was started at 2:42 PM.   Patient ID: Randall Reed, male    DOB: [birth year], 42 y.o.   MRN: [id number]  HPI HPI Comments: Randall Reed is a 42 y.o. male with a history of Hypertension, GERD, Anxiety, Arthritis, Depression, and Asthma who presents to the [facility] needing a refill for Xanax, Librax, Allegra D, Albuterol, Atrovent, Flonase, Nexium, Ketoprofen, Maxalt, Ambien, Singulair, and Voltaren.  He states that he is still taking Cialis and it is still treating his symptoms.  He states that he only uses his Librax when he experiences gastrointestinal problems.  The patient states that he now uses Allegra D with better relief to his symptoms.  The patient states that he no longer takes Naproxen Sodium.  He states he takes 2-3 doses of Xanax depending on the day.  He states that he takes Nexium once a day.  He states that if he does not take the medication throughout the day, he will take it before going to bed to avoid experiencing vivid dreams.  He states that he does not take an Ambien before bed if he is physically exhausted or has to be up early in the morning.   He states that the skin problems he experienced the last time he came to [facility] has resolved itself.  The patient denies experiencing any flu-like or seasonal allergy symptoms this Winter.  He states that he had an MRI done of his right shoulder.  He states that Dr. Amado Coe is doing his physical therapy. He states that his weight loss has been stagnant lately.  He states that he saw Dr. Ardis Hughs and was put on Androgel.    He states that he had labs performed in April of 2014.   He states he works out twice a day.  He that that his previous shoulder surgery prevents him from swimming but he does take a spin class.  He states that work has been very hectic.    In private conversation without scribe, he has questions about exposures as he is becoming active with his new orientation/denies risky behaviors/never tested/he is interested in travada  Patient Active Problem List   Diagnosis Date Noted  . rotator cuff tear 10/17/2013  . Hypogonadism male 09/04/2013  . ED (erectile dysfunction) 07/30/2013  . Palpitations 02/21/2012  . BMI 33.0-33.9,adult 02/04/2012  . Allergic rhinitis 02/04/2012  . GERD (gastroesophageal reflux disease) 02/04/2012  . Migraine 02/04/2012  . GAD (generalized anxiety disorder) 02/04/2012  . Insomnia 02/04/2012  . HYPERTENSION, BENIGN 04/13/2009  . CHEST PAIN-UNSPECIFIED 04/13/2009     Family History  Problem Relation Age of Onset  . Hypertension    . Heart attack    . Cancer    . Breast cancer    . Hyperlipidemia    . Coronary artery disease Father 54    stents, CABG  . Heart attack Father   . Coronary artery disease Paternal Grandfather   . Heart attack Maternal Grandfather     Allergies  Allergen Reactions  . Desloratadine Other (See Comments)    CLARINEX-"severe headache"  . Hydrocod Polst-Cpm Polst Er Itching  . Loratadine Other (See Comments)    "severe headache"  . Telithromycin   . Tussionex Pennkinetic Er [Hydrocod Polst-Cpm Polst Er] Itching  . Levbid [Hyoscyamine Sulfate] Rash  Review of Systems  All other systems reviewed and are negative.   continues to followup with Dr. Lucianne Muss for hypogonadism Response to Cialis when needed  Objective:  Physical Exam  Nursing note and vitals reviewed. Constitutional: He is oriented to person, place, and time. He appears well-developed and well-nourished. No distress.  HENT:  Head: Normocephalic and atraumatic.  Nose: Nose normal.  Mouth/Throat: Oropharynx is clear and moist.  Eyes: Conjunctivae and EOM are normal. Pupils are equal, round, and reactive to light.  Neck: Neck supple. No thyromegaly present.  Cardiovascular: Normal rate,  regular rhythm, normal heart sounds and intact distal pulses.   No murmur heard. Pulmonary/Chest: Effort normal and breath sounds normal. No respiratory distress.  Musculoskeletal: He exhibits no edema.  Lymphadenopathy:    He has no cervical adenopathy.  Neurological: He is alert and oriented to person, place, and time. No cranial nerve deficit.  Psychiatric: He has a normal mood and affect. His behavior is normal. Judgment and thought content normal.    BP 134/80  Pulse 82  Temp(Src) 98.2 F (36.8 C) (Oral)  Resp 18  Ht 5' 10.5" (1.791 m)  Wt 213 lb 6.4 oz (96.798 kg)  BMI 30.18 kg/m2  SpO2 97%   40 minutes OV Assessment & Plan:   I personally performed the services described in this documentation, which was scribed in my presence. The recorded information has been reviewed and is accurate. I have completed the patient encounter in its entirety as documented by the scribe, with editing by me where necessary. Rhaya Coale P. Merla Riches, M.D. IBS (irritable bowel syndrome) - Plan: clidinium-chlordiazePOXIDE (LIBRAX) 5-2.5 MG per capsule  RAD (reactive airway disease) - Plan: albuterol (PROVENTIL HFA;VENTOLIN HFA) 108 (90 BASE) MCG/ACT inhaler  AR (allergic rhinitis) - Plan: ipratropium (ATROVENT) 0.06 % nasal spray, fluticasone (FLONASE) 50 MCG/ACT nasal spray  Knee pain, unspecified laterality - Plan: diclofenac (VOLTAREN) 75 MG EC tablet  Erectile dysfunction - Plan: tadalafil (CIALIS) 20 MG tablet  Migraine - Plan: ketoprofen (ORUDIS) 75 MG capsule, rizatriptan (MAXALT-MLT) 10 MG disintegrating tablet  Insomnia - Plan: zolpidem (AMBIEN) 10 MG tablet  HYPERTENSION, BENIGN  GERD (gastroesophageal reflux disease) - Plan: esomeprazole (NEXIUM) 40 MG capsule  GAD (generalized anxiety disorder) - Plan: ALPRAZolam (XANAX) 1 MG tablet  Allergic rhinitis - Plan: fexofenadine-pseudoephedrine (ALLEGRA-D 24 HOUR) 180-240 MG per 24 hr tablet, ipratropium (ATROVENT HFA) 17 MCG/ACT  inhaler, montelukast (SINGULAIR) 10 MG tablet  Shoulder pain, right--- rotator cuff pathology by MRI responding to nitroglycerin/consider Dr. Joanne Gavel physical therapy  Exposure to communicable diseases - Plan: HIV antibody, Hepatitis C antibody, Hepatitis B surface antibody, Hepatitis B surface antigen, RPR, Hepatitis A Antibody, IGM  Hypergonadism-endocrinology/supplementation  Meds ordered this encounter  Medications  . clidinium-chlordiazePOXIDE (LIBRAX) 5-2.5 MG per capsule    Sig: Take 1 capsule by mouth 2 (two) times daily.    Dispense:  60 capsule    Refill:  5    Order Specific Question:  Supervising Provider    Answer:  Ethelda Chick [[id number]]  . fexofenadine-pseudoephedrine (ALLEGRA-D 24 HOUR) 180-240 MG per 24 hr tablet    Sig: Take 1 tablet by mouth daily.    Dispense:  30 tablet    Refill:  11  . albuterol (PROVENTIL HFA;VENTOLIN HFA) 108 (90 BASE) MCG/ACT inhaler    Sig: Inhale 2 puffs into the lungs every 6 (six) hours as needed.    Dispense:  1 Inhaler    Refill:  5  . ipratropium (ATROVENT) 0.06 % nasal  spray    Sig: Place 2 sprays into the nose 2 (two) times daily.    Dispense:  15 mL    Refill:  9  . fluticasone (FLONASE) 50 MCG/ACT nasal spray    Sig: Place 2 sprays into both nostrils daily.    Dispense:  16 g    Refill:  9  . esomeprazole (NEXIUM) 40 MG capsule    Sig: Take 1 capsule (40 mg total) by mouth daily before breakfast.    Dispense:  30 capsule    Refill:  11  . ketoprofen (ORUDIS) 75 MG capsule    Sig: Take 1 capsule (75 mg total) by mouth 4 (four) times daily as needed.    Dispense:  30 capsule    Refill:  5  . diclofenac (VOLTAREN) 75 MG EC tablet    Sig: Take 1 tablet (75 mg total) by mouth 2 (two) times daily.    Dispense:  60 tablet    Refill:  5    Order Specific Question:  Supervising Provider    Answer:  Jacobey Gura P [[id number]]  . ALPRAZolam (XANAX) 1 MG tablet    Sig: TAKE 1 TABLET BY MOUTH THREE TIMES DAILY AS NEEDED     Dispense:  90 tablet    Refill:  5  . ipratropium (ATROVENT HFA) 17 MCG/ACT inhaler    Sig: INHALE 2 PUFFS INTO THE LUNGS TWICE DAILY OR AS DIRECTED    Dispense:  12.9 g    Refill:  3  . tadalafil (CIALIS) 20 MG tablet    Sig: TAKE 1/2 TO 1 TABLET BY MOUTH 1 HOUR PRIOR TO INTERCOURSE UP TO 24 HOURS    Dispense:  5 tablet    Refill:  11  . rizatriptan (MAXALT-MLT) 10 MG disintegrating tablet    Sig: AS DIRECTED    Dispense:  9 tablet    Refill:  5  . montelukast (SINGULAIR) 10 MG tablet    Sig: TAKE 1 TABLET BY MOUTH EVERY NIGHT AT BEDTIME    Dispense:  30 tablet    Refill:  11  . zolpidem (AMBIEN) 10 MG tablet    Sig: TAKE 1 TABLET BY MOUTH ONCE DAILY AT BEDTIME AS NEEDED FOR SLEEP    Dispense:  30 tablet    Refill:  5  . emtricitabine-tenofovir (TRUVADA) 200-300 MG per tablet    Sig: Take 1 tablet by mouth daily.    Dispense:  30 tablet    Refill:  11----------- he will research this and consider prophylaxis

## 2013-10-18 ENCOUNTER — Encounter: Payer: Self-pay | Admitting: Internal Medicine

## 2013-10-18 NOTE — Addendum Note (Signed)
Addended by: Tonye Pearson on: 10/18/2013 11:42 PM   Modules accepted: Orders, Level of Service

## 2013-10-19 ENCOUNTER — Telehealth: Payer: Self-pay | Admitting: Radiology

## 2013-10-19 NOTE — Telephone Encounter (Signed)
Message copied by Caffie Damme on Mon Oct 19, 2013 12:41 PM ------      Message from: Tonye Pearson      Created: Sun Oct 18, 2013 11:42 PM       Tell him that in order for him to start hepatitis A and B. is pending and he may do so any time within the next month but simply coming by the office--please check to be sure this has been ordered correctly by me and that we do have the combined A. and B. vaccine or I. can order them separately ------

## 2013-10-19 NOTE — Telephone Encounter (Signed)
We do not have the combined hep A and B, I have ordered what he needs.

## 2013-10-21 ENCOUNTER — Other Ambulatory Visit: Payer: Self-pay | Admitting: Family Medicine

## 2013-10-26 ENCOUNTER — Ambulatory Visit (INDEPENDENT_AMBULATORY_CARE_PROVIDER_SITE_OTHER): Payer: BC Managed Care – PPO | Admitting: Physician Assistant

## 2013-10-26 DIAGNOSIS — Z23 Encounter for immunization: Secondary | ICD-10-CM

## 2013-10-26 NOTE — Progress Notes (Signed)
Presents for vaccinations.  Orders are in and released.

## 2013-11-01 ENCOUNTER — Other Ambulatory Visit: Payer: Self-pay | Admitting: Physician Assistant

## 2013-11-02 ENCOUNTER — Other Ambulatory Visit (INDEPENDENT_AMBULATORY_CARE_PROVIDER_SITE_OTHER): Payer: BC Managed Care – PPO

## 2013-11-02 DIAGNOSIS — R7989 Other specified abnormal findings of blood chemistry: Secondary | ICD-10-CM

## 2013-11-02 DIAGNOSIS — E291 Testicular hypofunction: Secondary | ICD-10-CM

## 2013-11-02 LAB — TESTOSTERONE: Testosterone: 331.4 ng/dL — ABNORMAL LOW (ref 350.00–890.00)

## 2013-11-03 ENCOUNTER — Other Ambulatory Visit: Payer: Self-pay | Admitting: Internal Medicine

## 2013-11-04 ENCOUNTER — Other Ambulatory Visit: Payer: Self-pay | Admitting: *Deleted

## 2013-11-04 ENCOUNTER — Telehealth: Payer: Self-pay

## 2013-11-04 DIAGNOSIS — K219 Gastro-esophageal reflux disease without esophagitis: Secondary | ICD-10-CM

## 2013-11-04 LAB — TESTOSTERONE, TOTAL, LC/MS

## 2013-11-04 MED ORDER — TESTOSTERONE 20.25 MG/ACT (1.62%) TD GEL: TRANSDERMAL | Status: DC

## 2013-11-04 MED ORDER — ESOMEPRAZOLE MAGNESIUM 40 MG PO CPDR: 40.0000 mg | DELAYED_RELEASE_CAPSULE | Freq: Every day | ORAL | Status: DC

## 2013-11-04 NOTE — Telephone Encounter (Signed)
Pt called stating that his Nexium didn't get RFd at [facility]. Checked records and it was sent in, but will resend now. Notified pt.

## 2013-11-11 ENCOUNTER — Ambulatory Visit (INDEPENDENT_AMBULATORY_CARE_PROVIDER_SITE_OTHER): Payer: BC Managed Care – PPO | Admitting: Family Medicine

## 2013-11-11 VITALS — BP 132/80 | HR 90 | Temp 98.1°F | Resp 18 | Ht 71.0 in | Wt 221.4 lb

## 2013-11-11 DIAGNOSIS — H6060 Unspecified chronic otitis externa, unspecified ear: Secondary | ICD-10-CM

## 2013-11-11 DIAGNOSIS — H6063 Unspecified chronic otitis externa, bilateral: Secondary | ICD-10-CM

## 2013-11-11 DIAGNOSIS — J019 Acute sinusitis, unspecified: Secondary | ICD-10-CM

## 2013-11-11 DIAGNOSIS — R05 Cough: Secondary | ICD-10-CM

## 2013-11-11 DIAGNOSIS — R059 Cough, unspecified: Secondary | ICD-10-CM

## 2013-11-11 DIAGNOSIS — H608X9 Other otitis externa, unspecified ear: Secondary | ICD-10-CM

## 2013-11-11 MED ORDER — CEFDINIR 300 MG PO CAPS: 300.0000 mg | ORAL_CAPSULE | Freq: Two times a day (BID) | ORAL | Status: DC

## 2013-11-11 MED ORDER — HYDROCORTISONE-ACETIC ACID 1-2 % OT SOLN: 3.0000 [drp] | Freq: Three times a day (TID) | OTIC | Status: DC

## 2013-11-11 MED ORDER — PROMETHAZINE-DM 6.25-15 MG/5ML PO SYRP: ORAL_SOLUTION | ORAL | Status: DC

## 2013-11-11 NOTE — Patient Instructions (Signed)
Let me know if you are not feeling better soon.  Also consider trying the rubbing alcohol alcohol and white vinegar 50/50 solution for prevention of swimmer's ear.

## 2013-11-11 NOTE — Progress Notes (Signed)
[facility] [address], [place] [address] [phone]  Date:  11/11/2013   Name:  Randall Reed   DOB:  [birth year]   MRN:  [id number]  PCP:  Leandrew Koyanagi, MD    Chief Complaint: Facial Pain and Cough   History of Present Illness:  Randall Reed is a 43 y.o. very pleasant male patient who presents with the following:  History of HTN, allergies.  He thinks that he may have a sinus infection.  He notes earache, cough, nasal congestion.   He has been ill for about 5 days.  Coughing as well No GI symptoms.  He has not noted a fever.  He does have a ST.   No sick contacts at home.   He does not have HIV- he does take truvada as a preventative.    He also teaches swimming about once a week and is concerned about preventing swimmer's ear.  He has had some vosol drops that he used for this in the past; would like to have some more of these if possible  The last time I treated him for a sinus infection in April with omnicef with good result.    Patient Active Problem List   Diagnosis Date Noted  . rotator cuff tear 10/17/2013  . Hypogonadism male 09/04/2013  . ED (erectile dysfunction) 07/30/2013  . Palpitations 02/21/2012  . BMI 33.0-33.9,adult 02/04/2012  . Allergic rhinitis 02/04/2012  . GERD (gastroesophageal reflux disease) 02/04/2012  . Migraine 02/04/2012  . GAD (generalized anxiety disorder) 02/04/2012  . Insomnia 02/04/2012  . HYPERTENSION, BENIGN 04/13/2009  . CHEST PAIN-UNSPECIFIED 04/13/2009    Past Medical History  Diagnosis Date  . GERD (gastroesophageal reflux disease)   . Hypertension   . Anxiety   . Seasonal allergic reaction   . Depression   . Arthritis   . Asthma     Past Surgical History  Procedure Laterality Date  . None    . None      History  Substance Use Topics  . Smoking status: Never Smoker   . Smokeless tobacco: Not on file  . Alcohol Use: 1.5 oz/week    3 drink(s) per week    Family History   Problem Relation Age of Onset  . Hypertension    . Heart attack    . Cancer    . Breast cancer    . Hyperlipidemia    . Coronary artery disease Father 25    stents, CABG  . Heart attack Father   . Coronary artery disease Paternal Grandfather   . Heart attack Maternal Grandfather     Allergies  Allergen Reactions  . Desloratadine Other (See Comments)    CLARINEX-"severe headache"  . Hydrocod Polst-Cpm Polst Er Itching  . Loratadine Other (See Comments)    "severe headache"  . Telithromycin   . Tussionex Pennkinetic Er [Hydrocod Polst-Cpm Polst Er] Itching  . Levbid [Hyoscyamine Sulfate] Rash    Medication list has been reviewed and updated.  Current Outpatient Prescriptions on File Prior to Visit  Medication Sig Dispense Refill  . acetic acid-hydrocortisone (VOSOL-HC) otic solution Place 5 drops into the right ear 3 (three) times daily.  10 mL  0  . albuterol (PROVENTIL HFA;VENTOLIN HFA) 108 (90 BASE) MCG/ACT inhaler Inhale 2 puffs into the lungs every 6 (six) hours as needed.  1 Inhaler  5  . ALPRAZolam (XANAX) 1 MG tablet TAKE 1 TABLET BY MOUTH THREE TIMES DAILY AS NEEDED  90 tablet  5  . betamethasone (CELESTONE) 0.6 MG/5ML solution Take 9 mg by mouth 2 (two) times daily.      . clidinium-chlordiazePOXIDE (LIBRAX) 5-2.5 MG per capsule Take 1 capsule by mouth 2 (two) times daily.  60 capsule  5  . diclofenac (VOLTAREN) 75 MG EC tablet Take 1 tablet (75 mg total) by mouth 2 (two) times daily.  60 tablet  5  . emtricitabine-tenofovir (TRUVADA) 200-300 MG per tablet Take 1 tablet by mouth daily.  30 tablet  11  . esomeprazole (NEXIUM) 40 MG capsule Take 1 capsule (40 mg total) by mouth daily before breakfast.  30 capsule  11  . fexofenadine-pseudoephedrine (ALLEGRA-D 24 HOUR) 180-240 MG per 24 hr tablet Take 1 tablet by mouth daily.  30 tablet  11  . fluticasone (FLONASE) 50 MCG/ACT nasal spray Place 2 sprays into both nostrils daily.  16 g  9  . hydrocortisone (ANUSOL-HC) 2.5  % rectal cream Place rectally 2 (two) times daily.  30 g  3  . ipratropium (ATROVENT HFA) 17 MCG/ACT inhaler INHALE 2 PUFFS INTO THE LUNGS TWICE DAILY OR AS DIRECTED  12.9 g  3  . ipratropium (ATROVENT) 0.06 % nasal spray Place 2 sprays into the nose 2 (two) times daily.  15 mL  9  . ketoprofen (ORUDIS) 75 MG capsule Take 1 capsule (75 mg total) by mouth 4 (four) times daily as needed.  30 capsule  5  . nitroGLYCERIN (NITRODUR - DOSED IN MG/24 HR) 0.1 mg/hr patch Place 1 patch onto the skin daily.      Marland Kitchen nystatin (MYCOSTATIN/NYSTOP) 100000 UNIT/GM POWD Apply to affected area 3 times per day as needed.  60 g  1  . rizatriptan (MAXALT-MLT) 10 MG disintegrating tablet AS DIRECTED  9 tablet  5  . tadalafil (CIALIS) 20 MG tablet TAKE 1/2 TO 1 TABLET BY MOUTH 1 HOUR PRIOR TO INTERCOURSE UP TO 24 HOURS  5 tablet  11  . Testosterone (ANDROGEL PUMP) 20.25 MG/ACT (1.62%) GEL Apply 2 pumps on one arm and one pump on the other arm daily  75 g  5  . zolpidem (AMBIEN) 10 MG tablet TAKE 1 TABLET BY MOUTH ONCE DAILY AT BEDTIME AS NEEDED FOR SLEEP  30 tablet  5  . montelukast (SINGULAIR) 10 MG tablet TAKE 1 TABLET BY MOUTH EVERY NIGHT AT BEDTIME  30 tablet  11   No current facility-administered medications on file prior to visit.    Review of Systems:  As per HPI- otherwise negative.   Physical Examination: Filed Vitals:   11/11/13 0806  BP: 132/80  Pulse: 90  Temp: 98.1 F (36.7 C)  Resp: 18   Filed Vitals:   11/11/13 0806  Height: 5\' 11"  (1.803 m)  Weight: 221 lb 6.4 oz (100.426 kg)   Body mass index is 30.89 kg/(m^2). Ideal Body Weight: Weight in (lb) to have BMI = 25: 178.9  GEN: WDWN, NAD, Non-toxic, A & O x 3, overweight, looks well HEENT: Atraumatic, Normocephalic. Neck supple. No masses, No LAD.  Bilateral TM wnl, oropharynx normal.  PEERL,EOMI.   Ears and Nose: No external deformity. CV: RRR, No M/G/R. No JVD. No thrill. No extra heart sounds. PULM: CTA B, no wheezes, crackles,  rhonchi. No retractions. No resp. distress. No accessory muscle use. ABD: S, NT, ND, +BS. No rebound. No HSM. EXTR: No c/c/e NEURO Normal gait.  PSYCH: Normally interactive. Conversant. Not depressed or anxious appearing.  Calm demeanor.    Assessment  and Plan: Sinusitis, acute - Plan: cefdinir (OMNICEF) 300 MG capsule  Swimmer's ear, chronic, bilateral - Plan: acetic acid-hydrocortisone (VOSOL-HC) otic solution  Cough - Plan: promethazine-dextromethorphan (PROMETHAZINE-DM) 6.25-15 MG/5ML syrup  Treat his current sx with omnicef, phenergan DM syrup for cough per his request Refilled his ear drops which he can use as needed to prevent OE.   Signed Lamar Blinks, MD

## 2013-12-07 ENCOUNTER — Other Ambulatory Visit: Payer: Self-pay | Admitting: *Deleted

## 2013-12-10 ENCOUNTER — Other Ambulatory Visit: Payer: Self-pay | Admitting: *Deleted

## 2013-12-10 ENCOUNTER — Telehealth: Payer: Self-pay

## 2013-12-10 NOTE — Telephone Encounter (Signed)
Pt CB and stated he has not tried any other meds lately. May have tried another one years ago, but he will agree to try anything Dr wishes to Rx if ins doesn't cover cialis. Completed PA on covermymeds.

## 2013-12-10 NOTE — Telephone Encounter (Signed)
PA needed for Cialis. LMOM for pt to CB to advise whether he has used any other meds for ED in the past.

## 2013-12-17 MED ORDER — SILDENAFIL CITRATE 100 MG PO TABS: ORAL_TABLET | ORAL | Status: DC

## 2013-12-17 NOTE — Telephone Encounter (Signed)
Dr Laney Pastor sent in Rx for viagra. Notified pt of new Rx and he stated he will try it and CB if is not effective.

## 2013-12-17 NOTE — Telephone Encounter (Signed)
PA was denied because Viagra has not been tried/failed. Dr Laney Pastor, do you want to Rx Viagra for pt? Pended w/same sig and # as on cialis Rx, please review, not sure you want to Rx in the same way.

## 2013-12-21 ENCOUNTER — Ambulatory Visit (INDEPENDENT_AMBULATORY_CARE_PROVIDER_SITE_OTHER): Payer: BC Managed Care – PPO | Admitting: Family Medicine

## 2013-12-21 VITALS — BP 126/86 | HR 76 | Temp 98.1°F | Resp 16 | Ht 71.0 in | Wt 222.0 lb

## 2013-12-21 DIAGNOSIS — R05 Cough: Secondary | ICD-10-CM

## 2013-12-21 DIAGNOSIS — J069 Acute upper respiratory infection, unspecified: Secondary | ICD-10-CM

## 2013-12-21 DIAGNOSIS — J019 Acute sinusitis, unspecified: Secondary | ICD-10-CM

## 2013-12-21 DIAGNOSIS — R059 Cough, unspecified: Secondary | ICD-10-CM

## 2013-12-21 DIAGNOSIS — R002 Palpitations: Secondary | ICD-10-CM

## 2013-12-21 MED ORDER — PROMETHAZINE-DM 6.25-15 MG/5ML PO SYRP: ORAL_SOLUTION | ORAL | Status: DC

## 2013-12-21 MED ORDER — CEFDINIR 300 MG PO CAPS: 300.0000 mg | ORAL_CAPSULE | Freq: Two times a day (BID) | ORAL | Status: DC

## 2013-12-21 NOTE — Progress Notes (Signed)
Subjective:    Patient ID: Randall Reed, male    DOB: 1971/04/16, 43 y.o.   MRN: [id number]  HPI Randall Reed is a 43 y.o. male  Yesterday - chest congestion, cough, some drainage down throat/pnd. No fever. pnd causing cough. No fever, not short of breath. Did not feel like tessalon worked in past. Taking mucinex already.   ED - Cialis used in past - 10mg  (1/2 of 20mg ),  not covered as well by insurance. No side effects on this. Viagra was sent in by Dr. Laney Pastor - 100mg , recently. After taking 1/2 of 100mg  Viagra last Saturday night - no initial side effects, but then 6-7 hours later - woke up feeling wired - felt anxious, like drank a few Red Bulls, but no caffeine that night. Lasted about 30 minutes. Resolved on own. No chest pain. Heart was racing/felt wired/amped. No ambien that night. Takes xanax at bedtime usually, did take hat night, just not Ambien. No recurrence since then.    Patient Active Problem List   Diagnosis Date Noted  . rotator cuff tear 10/17/2013  . Hypogonadism male 09/04/2013  . ED (erectile dysfunction) 07/30/2013  . Palpitations 02/21/2012  . BMI 33.0-33.9,adult 02/04/2012  . Allergic rhinitis 02/04/2012  . GERD (gastroesophageal reflux disease) 02/04/2012  . Migraine 02/04/2012  . GAD (generalized anxiety disorder) 02/04/2012  . Insomnia 02/04/2012  . HYPERTENSION, BENIGN 04/13/2009  . CHEST PAIN-UNSPECIFIED 04/13/2009   Past Medical History  Diagnosis Date  . GERD (gastroesophageal reflux disease)   . Hypertension   . Anxiety   . Seasonal allergic reaction   . Depression   . Arthritis   . Asthma    Past Surgical History  Procedure Laterality Date  . None    . None     Allergies  Allergen Reactions  . Desloratadine Other (See Comments)    CLARINEX-"severe headache"  . Hydrocod Polst-Cpm Polst Er Itching  . Loratadine Other (See Comments)    "severe headache"  . Telithromycin   . Tussionex Pennkinetic Er [Hydrocod Polst-Cpm Polst Er]  Itching  . Levbid [Hyoscyamine Sulfate] Rash   Prior to Admission medications   Medication Sig Start Date End Date Taking? Authorizing Provider  acetic acid-hydrocortisone (VOSOL-HC) otic solution Place 3 drops into both ears 3 (three) times daily. Use as needed to prevent swimmer's ear 11/11/13  Yes Randall Filler Copland, MD  albuterol (PROVENTIL HFA;VENTOLIN HFA) 108 (90 BASE) MCG/ACT inhaler Inhale 2 puffs into the lungs every 6 (six) hours as needed. 10/17/13  Yes Randall Koyanagi, MD  ALPRAZolam [name]) 1 MG tablet TAKE 1 TABLET BY MOUTH THREE TIMES DAILY AS NEEDED 10/17/13  Yes Randall Koyanagi, MD  betamethasone (CELESTONE) 0.6 MG/5ML solution Take 9 mg by mouth 2 (two) times daily.   Yes Historical Provider, MD  cefdinir (OMNICEF) 300 MG capsule Take 1 capsule (300 mg total) by mouth 2 (two) times daily. 11/11/13  Yes Randall C Copland, MD  clidinium-chlordiazePOXIDE (LIBRAX) 5-2.5 MG per capsule Take 1 capsule by mouth 2 (two) times daily. 10/17/13  Yes Randall Koyanagi, MD  diclofenac (VOLTAREN) 75 MG EC tablet Take 1 tablet (75 mg total) by mouth 2 (two) times daily. 10/17/13  Yes Randall Koyanagi, MD  emtricitabine-tenofovir (TRUVADA) 200-300 MG per tablet Take 1 tablet by mouth daily. 10/17/13  Yes Randall Koyanagi, MD  esomeprazole (NEXIUM) 40 MG capsule Take 1 capsule (40 mg total) by mouth daily before breakfast. 11/04/13  Yes Randall Koyanagi, MD  fexofenadine-pseudoephedrine (ALLEGRA-D 24 HOUR) 180-240 MG per 24 hr tablet Take 1 tablet by mouth daily. 10/17/13  Yes Randall Koyanagi, MD  fluticasone (FLONASE) 50 MCG/ACT nasal spray Place 2 sprays into both nostrils daily. 10/17/13  Yes Randall Koyanagi, MD  hydrocortisone (ANUSOL-HC) 2.5 % rectal cream Place rectally 2 (two) times daily. 08/27/12  Yes Randall Koyanagi, MD  ipratropium (ATROVENT HFA) 17 MCG/ACT inhaler INHALE 2 PUFFS INTO THE LUNGS TWICE DAILY OR AS DIRECTED 10/17/13  Yes Randall Koyanagi, MD    ipratropium (ATROVENT) 0.06 % nasal spray Place 2 sprays into the nose 2 (two) times daily. 10/17/13  Yes Randall Koyanagi, MD  ketoprofen (ORUDIS) 75 MG capsule Take 1 capsule (75 mg total) by mouth 4 (four) times daily as needed. 10/17/13  Yes Randall Koyanagi, MD  montelukast (SINGULAIR) 10 MG tablet TAKE 1 TABLET BY MOUTH EVERY NIGHT AT BEDTIME 10/17/13  Yes Randall Koyanagi, MD  nitroGLYCERIN (NITRODUR - DOSED IN MG/24 HR) 0.1 mg/hr patch Place 1 patch onto the skin daily.   Yes Historical Provider, MD  nystatin (MYCOSTATIN/NYSTOP) 100000 UNIT/GM POWD Apply to affected area 3 times per day as needed. 05/29/13  Yes [name], MD  promethazine-dextromethorphan (PROMETHAZINE-DM) 6.25-15 MG/5ML syrup Take a 1/2 or 1 teaspoon every 6- 8 hours as needed for cough 11/11/13  Yes Randall Filler Copland, MD  rizatriptan (MAXALT-MLT) 10 MG disintegrating tablet AS DIRECTED 10/17/13  Yes Randall Koyanagi, MD  sildenafil (VIAGRA) 100 MG tablet Take 1/2 to 1 tablet by mouth up to 12 hr before needed 12/17/13  Yes Randall Koyanagi, MD  tadalafil (CIALIS) 20 MG tablet TAKE 1/2 TO 1 TABLET BY MOUTH 1 HOUR PRIOR TO INTERCOURSE UP TO 24 HOURS 10/17/13  Yes Randall Koyanagi, MD  Testosterone (ANDROGEL PUMP) 20.25 MG/ACT (1.62%) GEL Apply 2 pumps on one arm and one pump on the other arm daily 11/04/13  Yes Randall Snare, MD  zolpidem (AMBIEN) 10 MG tablet TAKE 1 TABLET BY MOUTH ONCE DAILY AT BEDTIME AS NEEDED FOR SLEEP 10/17/13  Yes Randall Koyanagi, MD   History   Social History  . Marital Status: Married    Spouse Name: N/A    Number of Children: 0  . Years of Education: N/A   Occupational History  . [employer])    Social History Main Topics  . Smoking status: Never Smoker   . Smokeless tobacco: Not on file  . Alcohol Use: 1.5 oz/week    3 drink(s) per week  . Drug Use: No  . Sexual Activity: Yes   Other Topics Concern  . Not on file   Social History  Narrative  . No narrative on file       Review of Systems  Constitutional: Negative for fever and chills.  HENT: Positive for postnasal drip. Negative for facial swelling and sinus pressure.   Respiratory: Positive for cough.   Cardiovascular: Positive for palpitations. Negative for chest pain.       Objective:   Physical Exam  Vitals reviewed. Constitutional: He is oriented to person, place, and time. He appears well-developed and well-nourished.  HENT:  Head: Normocephalic and atraumatic.  Right Ear: Tympanic membrane, external ear and ear canal normal.  Left Ear: Tympanic membrane, external ear and ear canal normal.  Nose: No rhinorrhea.  Mouth/Throat: Oropharynx is clear and moist and mucous membranes are normal. No oropharyngeal exudate or posterior oropharyngeal erythema.  Eyes: Conjunctivae are  normal. Pupils are equal, round, and reactive to light.  Neck: Neck supple.  Cardiovascular: Normal rate, regular rhythm, normal heart sounds and intact distal pulses.   No murmur heard. Pulmonary/Chest: Effort normal and breath sounds normal. He has no wheezes. He has no rhonchi. He has no rales.  Abdominal: Soft. There is no tenderness.  Lymphadenopathy:    He has no cervical adenopathy.  Neurological: He is alert and oriented to person, place, and time.  Skin: Skin is warm and dry. No rash noted.  Psychiatric: He has a normal mood and affect. His behavior is normal.    Filed Vitals:   12/21/13 0837  BP: 126/86  Pulse: 76  Temp: 98.1 F (36.7 C)  TempSrc: Oral  Resp: 16  Height: 5\' 11"  (1.803 m)  Weight: 222 lb (100.699 kg)  SpO2: 98%      Assessment & Plan:   JATHANIEL BRUS is a 43 y.o. male Acute upper respiratory infections of unspecified site - 2nd day of pnd, reassuring exam. Discussed likely viral nature. Declined tessalon perles although rationale for choosing this including pnd/irritant cough and nonsedating, but still declined. hydrocodone allergy. Ok to  [employer], then if needed phenergan DM. Sx care as below, then if not improving in next 5 days - has printed Omn[facility] rx.   Cough - Plan: promethazine-dextromethorphan (PROMETHAZINE-DM) 6.25-15 MG/5ML syrup - as above.   Palpitations - single episode, after viagra 7 hours prior. Doubt related, and no recurrence. No further workup at present, but if recurrent - rtc to discuss and stop Viagra if recurs. Rtc/er precautions.   Meds ordered this encounter  Medications  . promethazine-dextromethorphan (PROMETHAZINE-DM) 6.25-15 MG/5ML syrup    Sig: Take a 1/2 or 1 teaspoon every 6- 8 hours as needed for cough    Dispense:  120 mL    Refill:  0  . cefdinir (OMNICEF) 300 MG capsule    Sig: Take 1 capsule (300 mg total) by mouth 2 (two) times daily.    Dispense:  20 capsule    Refill:  0   Patient Instructions  Your cough is likely due to a virus at this point, and do not think you need an antibiotic presently. If not starting to improve in next 5 days, can fill antibiotic. Return to the clinic or go to the nearest emergency room if any of your symptoms worsen or new symptoms occur.  If symptoms return after next dose of Viagra, stop this medicine and return to discuss further.   Cough, Adult  A cough is a reflex that helps clear your throat and airways. It can help heal the body or may be a reaction to an irritated airway. A cough may only last 2 or 3 weeks (acute) or may last more than 8 weeks (chronic).  CAUSES Acute cough:  Viral or bacterial infections. Chronic cough:  Infections.  Allergies.  Asthma.  Post-nasal drip.  Smoking.  Heartburn or acid reflux.  Some medicines.  Chronic lung problems (COPD).  Cancer. SYMPTOMS   Cough.  Fever.  Chest pain.  Increased breathing rate.  High-pitched whistling sound when breathing (wheezing).  Colored mucus that you cough up (sputum). TREATMENT   A bacterial cough may be treated with antibiotic medicine.  A viral  cough must run its course and will not respond to antibiotics.  Your caregiver may recommend other treatments if you have a chronic cough. HOME CARE INSTRUCTIONS   Only take over-the-counter or prescription medicines for pain, discomfort, or  fever as directed by your caregiver. Use cough suppressants only as directed by your caregiver.  Use a cold steam vaporizer or humidifier in your bedroom or home to help loosen secretions.  Sleep in a semi-upright position if your cough is worse at night.  Rest as needed.  Stop smoking if you smoke. SEEK IMMEDIATE MEDICAL CARE IF:   You have pus in your sputum.  Your cough starts to worsen.  You cannot control your cough with suppressants and are losing sleep.  You begin coughing up blood.  You have difficulty breathing.  You develop pain which is getting worse or is uncontrolled with medicine.  You have a fever. MAKE SURE YOU:   Understand these instructions.  Will watch your condition.  Will get help right away if you are not doing well or get worse. Document Released: 04/20/2011 Document Revised: 01/14/2012 Document Reviewed: 04/20/2011 [facility] Patient Information 2014 [place]. Upper Respiratory Infection, Adult An upper respiratory infection (URI) is also sometimes known as the common cold. The upper respiratory tract includes the nose, sinuses, throat, trachea, and bronchi. Bronchi are the airways leading to the lungs. Most people improve within 1 week, but symptoms can last up to 2 weeks. A residual cough may last even longer.  CAUSES Many different viruses can infect the tissues lining the upper respiratory tract. The tissues become irritated and inflamed and often become very moist. Mucus production is also common. A cold is contagious. You can easily spread the virus to others by oral contact. This includes kissing, sharing a glass, coughing, or sneezing. Touching your mouth or nose and then touching a surface, which is  then touched by another person, can also spread the virus. SYMPTOMS  Symptoms typically develop 1 to 3 days after you come in contact with a cold virus. Symptoms vary from person to person. They may include:  Runny nose.  Sneezing.  Nasal congestion.  Sinus irritation.  Sore throat.  Loss of voice (laryngitis).  Cough.  Fatigue.  Muscle aches.  Loss of appetite.  Headache.  Low-grade fever. DIAGNOSIS  You might diagnose your own cold based on familiar symptoms, since most people get a cold 2 to 3 times a year. Your caregiver can confirm this based on your exam. Most importantly, your caregiver can check that your symptoms are not due to another disease such as strep throat, sinusitis, pneumonia, asthma, or epiglottitis. Blood tests, throat tests, and X-rays are not necessary to diagnose a common cold, but they may sometimes be helpful in excluding other more serious diseases. Your caregiver will decide if any further tests are required. RISKS AND COMPLICATIONS  You may be at risk for a more severe case of the common cold if you smoke cigarettes, have chronic heart disease (such as heart failure) or lung disease (such as asthma), or if you have a weakened immune system. The very young and very old are also at risk for more serious infections. Bacterial sinusitis, middle ear infections, and bacterial pneumonia can complicate the common cold. The common cold can worsen asthma and chronic obstructive pulmonary disease (COPD). Sometimes, these complications can require emergency medical care and may be life-threatening. PREVENTION  The best way to protect against getting a cold is to practice good hygiene. Avoid oral or hand contact with people with cold symptoms. Wash your hands often if contact occurs. There is no clear evidence that vitamin C, vitamin E, echinacea, or exercise reduces the chance of developing a cold. However, it is always  recommended to get plenty of rest and practice  good nutrition. TREATMENT  Treatment is directed at relieving symptoms. There is no cure. Antibiotics are not effective, because the infection is caused by a virus, not by bacteria. Treatment may include:  Increased fluid intake. Sports drinks offer valuable electrolytes, sugars, and fluids.  Breathing heated mist or steam (vaporizer or shower).  Eating chicken soup or other clear broths, and maintaining good nutrition.  Getting plenty of rest.  Using gargles or lozenges for comfort.  Controlling fevers with ibuprofen or acetaminophen as directed by your caregiver.  Increasing usage of your inhaler if you have asthma. Zinc gel and zinc lozenges, taken in the first 24 hours of the common cold, can shorten the duration and lessen the severity of symptoms. Pain medicines may help with fever, muscle aches, and throat pain. A variety of non-prescription medicines are available to treat congestion and runny nose. Your caregiver can make recommendations and may suggest nasal or lung inhalers for other symptoms.  HOME CARE INSTRUCTIONS   Only take over-the-counter or prescription medicines for pain, discomfort, or fever as directed by your caregiver.  Use a warm mist humidifier or inhale steam from a shower to increase air moisture. This may keep secretions moist and make it easier to breathe.  Drink enough water and fluids to keep your urine clear or pale yellow.  Rest as needed.  Return to work when your temperature has returned to normal or as your caregiver advises. You may need to stay home longer to avoid infecting others. You can also use a face mask and careful hand washing to prevent spread of the virus. SEEK MEDICAL CARE IF:   After the first few days, you feel you are getting worse rather than better.  You need your caregiver's advice about medicines to control symptoms.  You develop chills, worsening shortness of breath, or brown or red sputum. These may be signs of  pneumonia.  You develop yellow or brown nasal discharge or pain in the face, especially when you bend forward. These may be signs of sinusitis.  You develop a fever, swollen neck glands, pain with swallowing, or white areas in the back of your throat. These may be signs of strep throat. SEEK IMMEDIATE MEDICAL CARE IF:   You have a fever.  You develop severe or persistent headache, ear pain, sinus pain, or chest pain.  You develop wheezing, a prolonged cough, cough up blood, or have a change in your usual mucus (if you have chronic lung disease).  You develop sore muscles or a stiff neck. Document Released: 04/17/2001 Document Revised: 01/14/2012 Document Reviewed: 02/23/2011 [facility] Patient Information 2014 [place], [place].

## 2013-12-21 NOTE — Patient Instructions (Signed)
Your cough is likely due to a virus at this point, and do not think you need an antibiotic presently. If not starting to improve in next 5 days, can fill antibiotic. Return to the clinic or go to the nearest emergency room if any of your symptoms worsen or new symptoms occur.  If symptoms return after next dose of Viagra, stop this medicine and return to discuss further.   Cough, Adult  A cough is a reflex that helps clear your throat and airways. It can help heal the body or may be a reaction to an irritated airway. A cough may only last 2 or 3 weeks (acute) or may last more than 8 weeks (chronic).  CAUSES Acute cough:  Viral or bacterial infections. Chronic cough:  Infections.  Allergies.  Asthma.  Post-nasal drip.  Smoking.  Heartburn or acid reflux.  Some medicines.  Chronic lung problems (COPD).  Cancer. SYMPTOMS   Cough.  Fever.  Chest pain.  Increased breathing rate.  High-pitched whistling sound when breathing (wheezing).  Colored mucus that you cough up (sputum). TREATMENT   A bacterial cough may be treated with antibiotic medicine.  A viral cough must run its course and will not respond to antibiotics.  Your caregiver may recommend other treatments if you have a chronic cough. HOME CARE INSTRUCTIONS   Only take over-the-counter or prescription medicines for pain, discomfort, or fever as directed by your caregiver. Use cough suppressants only as directed by your caregiver.  Use a cold steam vaporizer or humidifier in your bedroom or home to help loosen secretions.  Sleep in a semi-upright position if your cough is worse at night.  Rest as needed.  Stop smoking if you smoke. SEEK IMMEDIATE MEDICAL CARE IF:   You have pus in your sputum.  Your cough starts to worsen.  You cannot control your cough with suppressants and are losing sleep.  You begin coughing up blood.  You have difficulty breathing.  You develop pain which is getting worse  or is uncontrolled with medicine.  You have a fever. MAKE SURE YOU:   Understand these instructions.  Will watch your condition.  Will get help right away if you are not doing well or get worse. Document Released: 04/20/2011 Document Revised: 01/14/2012 Document Reviewed: 04/20/2011 [facility] Patient Information 2014 [place]. Upper Respiratory Infection, Adult An upper respiratory infection (URI) is also sometimes known as the common cold. The upper respiratory tract includes the nose, sinuses, throat, trachea, and bronchi. Bronchi are the airways leading to the lungs. Most people improve within 1 week, but symptoms can last up to 2 weeks. A residual cough may last even longer.  CAUSES Many different viruses can infect the tissues lining the upper respiratory tract. The tissues become irritated and inflamed and often become very moist. Mucus production is also common. A cold is contagious. You can easily spread the virus to others by oral contact. This includes kissing, sharing a glass, coughing, or sneezing. Touching your mouth or nose and then touching a surface, which is then touched by another person, can also spread the virus. SYMPTOMS  Symptoms typically develop 1 to 3 days after you come in contact with a cold virus. Symptoms vary from person to person. They may include:  Runny nose.  Sneezing.  Nasal congestion.  Sinus irritation.  Sore throat.  Loss of voice (laryngitis).  Cough.  Fatigue.  Muscle aches.  Loss of appetite.  Headache.  Low-grade fever. DIAGNOSIS  You might diagnose your  own cold based on familiar symptoms, since most people get a cold 2 to 3 times a year. Your caregiver can confirm this based on your exam. Most importantly, your caregiver can check that your symptoms are not due to another disease such as strep throat, sinusitis, pneumonia, asthma, or epiglottitis. Blood tests, throat tests, and X-rays are not necessary to diagnose a common  cold, but they may sometimes be helpful in excluding other more serious diseases. Your caregiver will decide if any further tests are required. RISKS AND COMPLICATIONS  You may be at risk for a more severe case of the common cold if you smoke cigarettes, have chronic heart disease (such as heart failure) or lung disease (such as asthma), or if you have a weakened immune system. The very young and very old are also at risk for more serious infections. Bacterial sinusitis, middle ear infections, and bacterial pneumonia can complicate the common cold. The common cold can worsen asthma and chronic obstructive pulmonary disease (COPD). Sometimes, these complications can require emergency medical care and may be life-threatening. PREVENTION  The best way to protect against getting a cold is to practice good hygiene. Avoid oral or hand contact with people with cold symptoms. Wash your hands often if contact occurs. There is no clear evidence that vitamin C, vitamin E, echinacea, or exercise reduces the chance of developing a cold. However, it is always recommended to get plenty of rest and practice good nutrition. TREATMENT  Treatment is directed at relieving symptoms. There is no cure. Antibiotics are not effective, because the infection is caused by a virus, not by bacteria. Treatment may include:  Increased fluid intake. Sports drinks offer valuable electrolytes, sugars, and fluids.  Breathing heated mist or steam (vaporizer or shower).  Eating chicken soup or other clear broths, and maintaining good nutrition.  Getting plenty of rest.  Using gargles or lozenges for comfort.  Controlling fevers with ibuprofen or acetaminophen as directed by your caregiver.  Increasing usage of your inhaler if you have asthma. Zinc gel and zinc lozenges, taken in the first 24 hours of the common cold, can shorten the duration and lessen the severity of symptoms. Pain medicines may help with fever, muscle aches, and  throat pain. A variety of non-prescription medicines are available to treat congestion and runny nose. Your caregiver can make recommendations and may suggest nasal or lung inhalers for other symptoms.  HOME CARE INSTRUCTIONS   Only take over-the-counter or prescription medicines for pain, discomfort, or fever as directed by your caregiver.  Use a warm mist humidifier or inhale steam from a shower to increase air moisture. This may keep secretions moist and make it easier to breathe.  Drink enough water and fluids to keep your urine clear or pale yellow.  Rest as needed.  Return to work when your temperature has returned to normal or as your caregiver advises. You may need to stay home longer to avoid infecting others. You can also use a face mask and careful hand washing to prevent spread of the virus. SEEK MEDICAL CARE IF:   After the first few days, you feel you are getting worse rather than better.  You need your caregiver's advice about medicines to control symptoms.  You develop chills, worsening shortness of breath, or brown or red sputum. These may be signs of pneumonia.  You develop yellow or brown nasal discharge or pain in the face, especially when you bend forward. These may be signs of sinusitis.  You develop  a fever, swollen neck glands, pain with swallowing, or white areas in the back of your throat. These may be signs of strep throat. SEEK IMMEDIATE MEDICAL CARE IF:   You have a fever.  You develop severe or persistent headache, ear pain, sinus pain, or chest pain.  You develop wheezing, a prolonged cough, cough up blood, or have a change in your usual mucus (if you have chronic lung disease).  You develop sore muscles or a stiff neck. Document Released: 04/17/2001 Document Revised: 01/14/2012 Document Reviewed: 02/23/2011 [facility] Patient Information 2014 [place], [place].

## 2013-12-21 NOTE — Addendum Note (Signed)
Addended by: [name] R on: 12/21/2013 09:35 AM   Modules accepted: Level of Service

## 2013-12-25 ENCOUNTER — Encounter: Payer: Self-pay | Admitting: Endocrinology

## 2013-12-25 ENCOUNTER — Encounter: Payer: Self-pay | Admitting: Internal Medicine

## 2013-12-28 ENCOUNTER — Other Ambulatory Visit: Payer: Self-pay | Admitting: *Deleted

## 2013-12-28 MED ORDER — TESTOSTERONE 20.25 MG/ACT (1.62%) TD GEL: TRANSDERMAL | Status: DC

## 2014-01-27 ENCOUNTER — Other Ambulatory Visit: Payer: BC Managed Care – PPO

## 2014-01-29 ENCOUNTER — Other Ambulatory Visit: Payer: Self-pay | Admitting: *Deleted

## 2014-01-29 ENCOUNTER — Other Ambulatory Visit (INDEPENDENT_AMBULATORY_CARE_PROVIDER_SITE_OTHER): Payer: BC Managed Care – PPO

## 2014-01-29 DIAGNOSIS — E291 Testicular hypofunction: Secondary | ICD-10-CM

## 2014-01-29 LAB — TESTOSTERONE: Testosterone: 223.75 ng/dL — ABNORMAL LOW (ref 350.00–890.00)

## 2014-02-02 ENCOUNTER — Other Ambulatory Visit: Payer: Self-pay | Admitting: *Deleted

## 2014-02-02 ENCOUNTER — Encounter: Payer: Self-pay | Admitting: Endocrinology

## 2014-02-02 ENCOUNTER — Ambulatory Visit (INDEPENDENT_AMBULATORY_CARE_PROVIDER_SITE_OTHER): Payer: BC Managed Care – PPO | Admitting: Endocrinology

## 2014-02-02 VITALS — BP 132/78 | HR 92 | Temp 98.3°F | Resp 16 | Ht 71.5 in | Wt 229.2 lb

## 2014-02-02 DIAGNOSIS — E291 Testicular hypofunction: Secondary | ICD-10-CM

## 2014-02-02 MED ORDER — TESTOSTERONE 20.25 MG/ACT (1.62%) TD GEL: TRANSDERMAL | Status: DC

## 2014-02-02 NOTE — Progress Notes (Signed)
Patient ID: Randall Reed, male   DOB: [birth year], 43 y.o.   MRN: [id number]   Chief complaint:  followup of hypogonadism  History of Present Illness:  Previous history: The patient has had difficulty with erectile dysfunction since about 2013 Also has had fatigue and decreased libido off and on especially since 3/14 In the last few years he has been able to get his weight down with diet and exercise; baseline weight was about 290 pounds He has been told to have a low testosterone level but review of his records indicate that his free testosterone was normal in 08/2012   Recent history:  Before starting treatment he was complaining of occasional fatigue and on some days he would be feeling exhausted.  He also had decreased libido.  His free testosterone level was in the normal range at 72 although his total testosterone was slightly low at 260 Because of symptoms he was started on AndroGel which was his insurance preferred formulation With this he had improved libido and overall energy level However his testosterone level has been fluctuating even with the same doses. More recently his total testosterone level is relatively lower, below pretreatment level He thinks he is very compliant with his AndroGel every morning before dressing per He usually applies 2 pumps on one side and one on the other. He is applying the AndroGel on his palm and then rubbing it in the upper arm. Sometimes he will use a hairdryer to dry it more quickly Recently his weight appears to be going up significantly even though he is exercising very regularly up to twice a day significant amount of aerobic activity    Wt Readings from Last 3 Encounters:  02/02/14 229 lb 3.2 oz (103.964 kg)  12/21/13 222 lb (100.699 kg)  11/11/13 221 lb 6.4 oz (100.426 kg)   LAB  Appointment on 01/29/2014  Component Date Value Ref Range Status  . Testosterone 01/29/2014 223.75* 350.00 - 890.00 ng/dL Final    Past Medical History   Diagnosis Date  . GERD (gastroesophageal reflux disease)   . Hypertension   . Anxiety   . Seasonal allergic reaction   . Depression   . Arthritis   . Asthma     Past Surgical History  Procedure Laterality Date  . None    . None      Family History  Problem Relation Age of Onset  . Hypertension    . Heart attack    . Cancer    . Breast cancer    . Hyperlipidemia    . Coronary artery disease Father 62    stents, CABG  . Heart attack Father   . Coronary artery disease Paternal Grandfather   . Heart attack Maternal Grandfather     Social History:  reports that he has never smoked. He does not have any smokeless tobacco history on file. He reports that he drinks about 1.5 ounces of alcohol per week. He reports that he does not use illicit drugs.  Allergies:  Allergies  Allergen Reactions  . Desloratadine Other (See Comments)    CLARINEX-"severe headache"  . Hydrocod Polst-Cpm Polst Er Itching  . Loratadine Other (See Comments)    "severe headache"  . Telithromycin   . Tussionex Pennkinetic Er [Hydrocod Polst-Cpm Polst Er] Itching  . Levbid [Hyoscyamine Sulfate] Rash      Medication List       This list is accurate as of: 02/02/14  9:58 AM.  Always use your most recent med  list.               acetic acid-hydrocortisone otic solution  Commonly known as:  VOSOL-HC  Place 3 drops into both ears 3 (three) times daily. Use as needed to prevent swimmer's ear     albuterol 108 (90 BASE) MCG/ACT inhaler  Commonly known as:  PROVENTIL HFA;VENTOLIN HFA  Inhale 2 puffs into the lungs every 6 (six) hours as needed.     ALPRAZolam 1 MG tablet  Commonly known as:  XANAX  TAKE 1 TABLET BY MOUTH THREE TIMES DAILY AS NEEDED     betamethasone 0.6 MG/5ML solution  Commonly known as:  CELESTONE  Take 9 mg by mouth 2 (two) times daily.     cefdinir 300 MG capsule  Commonly known as:  OMNICEF  Take 1 capsule (300 mg total) by mouth 2 (two) times daily.      clidinium-chlordiazePOXIDE 5-2.5 MG per capsule  Commonly known as:  LIBRAX  Take 1 capsule by mouth 2 (two) times daily.     diclofenac 75 MG EC tablet  Commonly known as:  VOLTAREN  Take 1 tablet (75 mg total) by mouth 2 (two) times daily.     emtricitabine-tenofovir 200-300 MG per tablet  Commonly known as:  TRUVADA  Take 1 tablet by mouth daily.     esomeprazole 40 MG capsule  Commonly known as:  NEXIUM  Take 1 capsule (40 mg total) by mouth daily before breakfast.     fexofenadine-pseudoephedrine 180-240 MG per 24 hr tablet  Commonly known as:  ALLEGRA-D 24 HOUR  Take 1 tablet by mouth daily.     fluticasone 50 MCG/ACT nasal spray  Commonly known as:  FLONASE  Place 2 sprays into both nostrils daily.     hydrocortisone 2.5 % rectal cream  Commonly known as:  ANUSOL-HC  Place rectally 2 (two) times daily.     ipratropium 0.06 % nasal spray  Commonly known as:  ATROVENT  Place 2 sprays into the nose 2 (two) times daily.     ipratropium 17 MCG/ACT inhaler  Commonly known as:  ATROVENT HFA  INHALE 2 PUFFS INTO THE LUNGS TWICE DAILY OR AS DIRECTED     ketoprofen 75 MG capsule  Commonly known as:  ORUDIS  Take 1 capsule (75 mg total) by mouth 4 (four) times daily as needed.     montelukast 10 MG tablet  Commonly known as:  SINGULAIR  TAKE 1 TABLET BY MOUTH EVERY NIGHT AT BEDTIME     nitroGLYCERIN 0.1 mg/hr patch  Commonly known as:  NITRODUR - Dosed in mg/24 hr  Place 1 patch onto the skin daily.     nystatin 100000 UNIT/GM Powd  Apply to affected area 3 times per day as needed.     promethazine-dextromethorphan 6.25-15 MG/5ML syrup  Commonly known as:  PROMETHAZINE-DM  Take a 1/2 or 1 teaspoon every 6- 8 hours as needed for cough     rizatriptan 10 MG disintegrating tablet  Commonly known as:  MAXALT-MLT  AS DIRECTED     sildenafil 100 MG tablet  Commonly known as:  VIAGRA  Take 1/2 to 1 tablet by mouth up to 12 hr before needed     tadalafil 20 MG  tablet  Commonly known as:  CIALIS  TAKE 1/2 TO 1 TABLET BY MOUTH 1 HOUR PRIOR TO INTERCOURSE UP TO 24 HOURS     Testosterone 20.25 MG/ACT (1.62%) Gel  Commonly known as:  ANDROGEL PUMP  Apply  2 pumps on one arm and one pump on the other arm daily     zolpidem 10 MG tablet  Commonly known as:  AMBIEN  TAKE 1 TABLET BY MOUTH ONCE DAILY AT BEDTIME AS NEEDED FOR SLEEP          REVIEW OF SYSTEMS:          Has tried Ambien for sleep   Free T4 was normal ruling out secondary hypothyroidism  He has been consistent relief of his erectile dysfunction problem with Viagra which is the preferred drug with his insurance. Was doing better with Cialis  Has not had any hyperglycemia, dyslipidemia or hypertension  PHYSICAL EXAM:  BP 132/78  Pulse 92  Temp(Src) 98.3 F (36.8 C)  Resp 16  Ht 5' 11.5" (1.816 m)  Wt 229 lb 3.2 oz (103.964 kg)  BMI 31.52 kg/m2  SpO2 98%    ASSESSMENT/PLAN:   Secondary hypogonadism, mild with symptoms of intermittent fatigue, decreased libido and erectile dysfunction   Not clear why his level is lower given with 3 pumps a day of the AndroGel. Again discussed in detail application technique He will apply the gel directly on the shoulder and increased the dose to 2 pumps on each side He will avoid using a hairdryer on it To check blood level in 4 weeks again including free testosterone level  Consider using Androderm or Axiron if not getting adequate level   Renard Caperton 02/02/2014, 9:58 AM

## 2014-02-02 NOTE — Patient Instructions (Signed)
2 pumps on each arm

## 2014-02-22 ENCOUNTER — Other Ambulatory Visit: Payer: Self-pay | Admitting: *Deleted

## 2014-02-22 ENCOUNTER — Encounter: Payer: Self-pay | Admitting: Endocrinology

## 2014-02-22 DIAGNOSIS — R635 Abnormal weight gain: Secondary | ICD-10-CM

## 2014-02-22 DIAGNOSIS — Z131 Encounter for screening for diabetes mellitus: Secondary | ICD-10-CM

## 2014-02-22 MED ORDER — TESTOSTERONE 20.25 MG/ACT (1.62%) TD GEL: TRANSDERMAL | Status: DC

## 2014-03-03 ENCOUNTER — Other Ambulatory Visit (INDEPENDENT_AMBULATORY_CARE_PROVIDER_SITE_OTHER): Payer: BC Managed Care – PPO

## 2014-03-03 DIAGNOSIS — E291 Testicular hypofunction: Secondary | ICD-10-CM

## 2014-03-03 DIAGNOSIS — Z131 Encounter for screening for diabetes mellitus: Secondary | ICD-10-CM

## 2014-03-03 DIAGNOSIS — R635 Abnormal weight gain: Secondary | ICD-10-CM

## 2014-03-03 LAB — T4, FREE: Free T4: 0.93 ng/dL (ref 0.60–1.60)

## 2014-03-03 LAB — TSH: TSH: 1.02 u[IU]/mL (ref 0.35–5.50)

## 2014-03-03 LAB — HEMOGLOBIN A1C: HEMOGLOBIN A1C: 4.8 % (ref 4.6–6.5)

## 2014-03-09 ENCOUNTER — Encounter: Payer: Self-pay | Admitting: Endocrinology

## 2014-03-20 ENCOUNTER — Encounter: Payer: Self-pay | Admitting: Endocrinology

## 2014-03-22 ENCOUNTER — Ambulatory Visit (INDEPENDENT_AMBULATORY_CARE_PROVIDER_SITE_OTHER): Payer: BC Managed Care – PPO | Admitting: Internal Medicine

## 2014-03-22 VITALS — BP 126/70 | HR 72 | Temp 97.8°F | Resp 16 | Ht 69.5 in | Wt 233.0 lb

## 2014-03-22 DIAGNOSIS — B351 Tinea unguium: Secondary | ICD-10-CM

## 2014-03-22 DIAGNOSIS — R222 Localized swelling, mass and lump, trunk: Secondary | ICD-10-CM

## 2014-03-22 DIAGNOSIS — Z23 Encounter for immunization: Secondary | ICD-10-CM

## 2014-03-22 DIAGNOSIS — E291 Testicular hypofunction: Secondary | ICD-10-CM

## 2014-03-22 DIAGNOSIS — R1900 Intra-abdominal and pelvic swelling, mass and lump, unspecified site: Secondary | ICD-10-CM

## 2014-03-22 DIAGNOSIS — G4700 Insomnia, unspecified: Secondary | ICD-10-CM

## 2014-03-22 MED ORDER — CICLOPIROX 8 % EX SOLN: Freq: Every day | CUTANEOUS | Status: DC

## 2014-03-22 NOTE — Progress Notes (Signed)
Subjective:   This chart was scribed for Randall Lin, MD by Forrestine Him, [facility] Scribe. This patient was seen in room 2 and the patient's care was started 6:04 PM.    Patient ID: Randall Reed, male    DOB: 1970-12-06, 43 y.o.   MRN: [id number]  HPI  Chief Complaint  Patient presents with  . Mass    on side-been there a while but now getting sore  . Tinea Pedis    left great toe    HPI Comments: Randall Reed is a 43 y.o. male who presents to [facility] complaining of recurrent, moderate tinea pedis to the L great toe x  1 week that is unchanged. Pt states he typically notes similar infections after cutting out ingrown toenails. He states he has tried products purchased from the nail salon without any improvement. Denies any fever or chills at this time.  He reports a mass to the R upper abdominal anterior axial line that has been ongoing for some time now and progressively worsening. States the area is now "sore" with tenderness with and without palpation at times. He says "sometimes the mass noticeably sticks out". States "the area sometimes shifts". He has not tried anything OTC or any home remedies for symptoms.He is concerned this may be cancer.  He is also very concerned about response to testosterone from Dr Dwyane Dee. The dose had to be increased and repeat labs were somehow misplaced at the end of April. He is gaining weight again, and feels tired and thinks his low testosterone is responsible.   Past Medical History  Diagnosis Date  . GERD (gastroesophageal reflux disease)   . Hypertension   . Anxiety   . Seasonal allergic reaction   . Depression   . Arthritis   . Asthma       Review of Systems  Constitutional: Negative for fever and chills.  HENT: Negative for congestion.   Respiratory: Negative for cough.   Skin: Negative for rash.  Psychiatric/Behavioral: Negative for confusion.   his insomnia responds well to  Ambien and he needs a refill   Objective:  Physical Exam  Nursing note and vitals reviewed. Constitutional: He is oriented to person, place, and time. He appears well-developed and well-nourished.  HENT:  Head: Normocephalic.  Eyes: Conjunctivae and EOM are normal. Pupils are equal, round, and reactive to light.  Neck: Normal range of motion. No thyromegaly present.  Cardiovascular: Normal rate, regular rhythm, normal heart sounds and intact distal pulses.   No murmur heard. Pulmonary/Chest: Effort normal and breath sounds normal. He has no wheezes.  Abdominal: Soft. He exhibits no distension.   The upper abdominal wall has several small subcutaneous masses extending from the midline to the right midaxillary line, none of which are irregular and a few of which are somewhat tender but without any overlying redness. There is no hernia defect noted.  Musculoskeletal: Normal range of motion. He exhibits no edema.  Lymphadenopathy:    He has no cervical adenopathy.  Neurological: He is alert and oriented to person, place, and time.  Skin:  The right first toenail has a dense focal infection in the medial aspect only. He has cut the nail toward the midline. The other nails are unaffected and there is no active fungus on the feet.  Psychiatric: He has a normal mood and affect.   Wt Readings from Last 3 Encounters:  03/22/14 233 lb (105.688 kg)  02/02/14  229 lb 3.2 oz (103.964 kg)  12/21/13 222 lb (100.699 kg)     Triage Vitals: BP 126/70  Pulse 72  Temp(Src) 97.8 F (36.6 C)  Resp 16  Ht 5' 9.5" (1.765 m)  Wt 233 lb (105.688 kg)  BMI 33.93 kg/m2  SpO2 97%   Assessment & Plan:   I personally performed the services described in this documentation, which was scribed in my presence. The recorded information has been reviewed and is accurate.  Hypogonadism male - Plan: Testosterone, free, total, Luteinizing hormone, Follicle stimulating hormone, Prolactin  Onychomycosis-one nail -  Plan: ciclopirox (PENLAC) 8 % solution/removal if fails  Masses of abdomen-? Lipomas vs other causes///will get surgical opinion to see if bx or MR  warranted  Insomnia--refill meds  Meds ordered this encounter  Medications  . ciclopirox (PENLAC) 8 % solution    Sig: Apply topically at bedtime. Apply over nail and surrounding skin. Apply daily over previous coat. After seven (7) days, may remove with alcohol and continue cycle.    Dispense:  6.6 mL    Refill:  0  . zolpidem (AMBIEN) 10 MG tablet    Sig: TAKE 1 TABLET BY MOUTH ONCE DAILY AT BEDTIME AS NEEDED FOR SLEEP    Dispense:  30 tablet    Refill:  5

## 2014-03-22 NOTE — Patient Instructions (Addendum)
Ciclopirox nail solution What is this medicine? CICLOPIROX (sye kloe PEER ox) NAIL SOLUTION is an antifungal medicine. It used to treat fungal infections of the nails. This medicine may be used for other purposes; ask your health care provider or pharmacist if you have questions. COMMON BRAND NAME(S): CNL8, Penlac What should I tell my health care provider before I take this medicine? They need to know if you have any of these conditions: -diabetes mellitus -history of seizures -HIV infection -immune system problems or organ transplant -large areas of burned or damaged skin -peripheral vascular disease or poor circulation -taking corticosteroid medication (including steroid inhalers, cream, or lotion) -an unusual or allergic reaction to ciclopirox, isopropyl alcohol, other medicines, foods, dyes, or preservatives -pregnant or trying to get pregnant -breast-feeding How should I use this medicine? This medicine is for external use only. Wash and dry your hands before use. Avoid contact with the eyes, mouth or nose. If you do get this medicine in your eyes, rinse out with plenty of cool tap water. Contact your doctor or health care professional if eye irritation occurs. Apply thin layer evenly once daily to entire nail and surrounding 5 mm of skin at bedtime. OK to put under nailbed where infected. Remove with alcohol once a week and file and trim the nail at that point. Repeat this weekly cycle up to 48 weeks. Improvement may take up to 6 months. Talk to your pediatrician regarding the use of this medicine in children. While this medicine may be prescribed for children as young as 12 years for selected conditions, precautions do apply. Overdosage: If you think you have taken too much of this medicine contact a poison control center or emergency room at once. NOTE: This medicine is only for you. Do not share this medicine with others. What if I miss a dose? If you miss a dose, use it as soon as  you can. If it is almost time for your next dose, use only that dose. Do not use double or extra doses. What may interact with this medicine? Interactions are not expected. Do not use any other skin products without telling your doctor or health care professional. This list may not describe all possible interactions. Give your health care provider a list of all the medicines, herbs, non-prescription drugs, or dietary supplements you use. Also tell them if you smoke, drink alcohol, or use illegal drugs. Some items may interact with your medicine. What should I watch for while using this medicine? Tell your doctor or health care professional if your symptoms get worse. Four to six months of treatment may be needed for the nail(s) to improve. Some people may not achieve a complete cure or clearing of the nails by this time. Tell your doctor or health care professional if you develop sores or blisters that do not heal properly. If your nail infection returns after stopping using this product, contact your doctor or health care professional. What side effects may I notice from receiving this medicine? Side effects that you should report to your doctor or health care professional as soon as possible: -allergic reactions like skin rash, itching or hives, swelling of the face, lips, or tongue -severe irritation, redness, burning, blistering, peeling, swelling, oozing Side effects that usually do not require medical attention (report to your doctor or health care professional if they continue or are bothersome): -mild reddening of the skin -nail discoloration -temporary burning or mild stinging at the site of application This list may not describe  all possible side effects. Call your doctor for medical advice about side effects. You may report side effects to FDA at 1-800-FDA-1088. Where should I keep my medicine? Keep out of the reach of children. Store at room temperature between 15 and 30 degrees C (59  and 86 degrees F). Do not freeze. Protect from light by storing the bottle in the carton after every use. This medicine is flammable. Keep away from heat and flame. Throw away any unused medicine after the expiration date. NOTE: This sheet is a summary. It may not cover all possible information. If you have questions about this medicine, talk to your doctor, pharmacist, or health care provider.  2014, [employer]vier/[employer] Standard. (2008-01-26 16:49:20)

## 2014-03-23 ENCOUNTER — Encounter (INDEPENDENT_AMBULATORY_CARE_PROVIDER_SITE_OTHER): Payer: Self-pay

## 2014-03-23 LAB — PROLACTIN: Prolactin: 6.6 ng/mL (ref 2.1–17.1)

## 2014-03-23 LAB — LUTEINIZING HORMONE: LH: 1.4 m[IU]/mL — AB (ref 1.5–9.3)

## 2014-03-23 LAB — FOLLICLE STIMULATING HORMONE: FSH: 2.4 m[IU]/mL (ref 1.4–18.1)

## 2014-03-24 ENCOUNTER — Encounter: Payer: Self-pay | Admitting: Internal Medicine

## 2014-03-24 LAB — TESTOSTERONE, FREE, TOTAL, SHBG
SEX HORMONE BINDING: 15 nmol/L (ref 13–71)
TESTOSTERONE FREE: 398.6 pg/mL — AB (ref 47.0–244.0)
TESTOSTERONE-% FREE: 3.4 % — AB (ref 1.6–2.9)
TESTOSTERONE: 1186 ng/dL — AB (ref 300–890)

## 2014-03-25 ENCOUNTER — Telehealth: Payer: Self-pay

## 2014-03-25 ENCOUNTER — Other Ambulatory Visit: Payer: Self-pay | Admitting: *Deleted

## 2014-03-25 ENCOUNTER — Encounter: Payer: Self-pay | Admitting: Internal Medicine

## 2014-03-25 DIAGNOSIS — E291 Testicular hypofunction: Secondary | ICD-10-CM

## 2014-03-25 NOTE — Telephone Encounter (Signed)
Patient called about his lab work. States he missed a call from [place]. Please return call and advise. Thank you.

## 2014-03-26 ENCOUNTER — Other Ambulatory Visit: Payer: BC Managed Care – PPO

## 2014-03-26 DIAGNOSIS — E291 Testicular hypofunction: Secondary | ICD-10-CM

## 2014-03-26 MED ORDER — ZOLPIDEM TARTRATE 10 MG PO TABS: ORAL_TABLET | ORAL | Status: DC

## 2014-03-26 NOTE — Telephone Encounter (Signed)
Faxed Rx to [facility]

## 2014-03-26 NOTE — Telephone Encounter (Signed)
Lab has talked to the patient since the message was taken.

## 2014-03-31 LAB — TESTOSTERONE, FREE, TOTAL, SHBG
TESTOSTERONE FREE: 5.4 pg/mL — AB (ref 6.8–21.5)
Testosterone, total: 247 ng/dL — ABNORMAL LOW (ref 348.0–1197.0)

## 2014-04-01 ENCOUNTER — Other Ambulatory Visit: Payer: Self-pay | Admitting: *Deleted

## 2014-04-01 MED ORDER — TESTOSTERONE 30 MG/ACT TD SOLN: TRANSDERMAL | Status: DC

## 2014-04-02 ENCOUNTER — Encounter: Payer: Self-pay | Admitting: Endocrinology

## 2014-04-13 ENCOUNTER — Ambulatory Visit (INDEPENDENT_AMBULATORY_CARE_PROVIDER_SITE_OTHER): Payer: BC Managed Care – PPO | Admitting: General Surgery

## 2014-04-13 ENCOUNTER — Encounter (INDEPENDENT_AMBULATORY_CARE_PROVIDER_SITE_OTHER): Payer: Self-pay | Admitting: General Surgery

## 2014-04-13 VITALS — BP 130/90 | HR 78 | Temp 98.1°F | Resp 14 | Ht 71.0 in | Wt 239.2 lb

## 2014-04-13 DIAGNOSIS — D1739 Benign lipomatous neoplasm of skin and subcutaneous tissue of other sites: Secondary | ICD-10-CM

## 2014-04-13 DIAGNOSIS — D171 Benign lipomatous neoplasm of skin and subcutaneous tissue of trunk: Secondary | ICD-10-CM

## 2014-04-14 NOTE — Progress Notes (Signed)
Patient ID: Randall Reed, male   DOB: [birth year], 43 y.o.   MRN: [id number]  Chief Complaint  Patient presents with  . abdominal mass    HPI Randall Reed is a 43 y.o. male.  Referred by Dr [name] HPI 72 yom who has lost significant weight and has noted several "lumps" present on his abdominal wall.  These are in his epigastrium and right lateral abdominal wall. He is concerned about them. They have begun to give him some discomfort although he states he doesn't really think they have changed at all.  He also has a complaint of ruq pain possibly associated with eating that goes straight through to his back. He has no associated n/v/change in bowel habits. This was previously evaluated with negative u/s but did have a hida that showed an ef of 5%.    Past Medical History  Diagnosis Date  . GERD (gastroesophageal reflux disease)   . Anxiety   . Seasonal allergic reaction   . Depression   . Arthritis   . Asthma   . Hypertension     no longer being treated since weight loss    Past Surgical History  Procedure Laterality Date  . None    . None      Family History  Problem Relation Age of Onset  . Hypertension    . Heart attack    . Cancer    . Breast cancer    . Hyperlipidemia    . Coronary artery disease Father 45    stents, CABG  . Heart attack Father   . Coronary artery disease Paternal Grandfather   . Heart attack Maternal Grandfather     Social History History  Substance Use Topics  . Smoking status: Never Smoker   . Smokeless tobacco: Not on file  . Alcohol Use: 1.5 oz/week    3 drink(s) per week    Allergies  Allergen Reactions  . Desloratadine Other (See Comments)    CLARINEX-"severe headache"  . Hydrocod Polst-Cpm Polst Er Itching  . Loratadine Other (See Comments)    "severe headache"  . Telithromycin   . Tussionex Pennkinetic Er [Hydrocod Polst-Cpm Polst Er] Itching  . Levbid [Hyoscyamine Sulfate] Rash    Current Outpatient Prescriptions    Medication Sig Dispense Refill  . acetic acid-hydrocortisone (VOSOL-HC) otic solution Place 3 drops into both ears 3 (three) times daily. Use as needed to prevent swimmer's ear  10 mL  0  . albuterol (PROVENTIL HFA;VENTOLIN HFA) 108 (90 BASE) MCG/ACT inhaler Inhale 2 puffs into the lungs every 6 (six) hours as needed.  1 Inhaler  5  . ALPRAZolam (XANAX) 1 MG tablet TAKE 1 TABLET BY MOUTH THREE TIMES DAILY AS NEEDED  90 tablet  5  . ciclopirox (PENLAC) 8 % solution Apply topically at bedtime. Apply over nail and surrounding skin. Apply daily over previous coat. After seven (7) days, may remove with alcohol and continue cycle.  6.6 mL  0  . clidinium-chlordiazePOXIDE (LIBRAX) 5-2.5 MG per capsule Take 1 capsule by mouth 2 (two) times daily.  60 capsule  5  . diclofenac (VOLTAREN) 75 MG EC tablet Take 1 tablet (75 mg total) by mouth 2 (two) times daily.  60 tablet  5  . esomeprazole (NEXIUM) 40 MG capsule Take 1 capsule (40 mg total) by mouth daily before breakfast.  30 capsule  11  . fexofenadine-pseudoephedrine (ALLEGRA-D 24 HOUR) 180-240 MG per 24 hr tablet Take 1 tablet by mouth daily.  30 tablet  11  . fluticasone (FLONASE) 50 MCG/ACT nasal spray Place 2 sprays into both nostrils daily.  16 g  9  . hydrocortisone (ANUSOL-HC) 2.5 % rectal cream Place rectally 2 (two) times daily.  30 g  3  . ipratropium (ATROVENT HFA) 17 MCG/ACT inhaler INHALE 2 PUFFS INTO THE LUNGS TWICE DAILY OR AS DIRECTED  12.9 g  3  . ipratropium (ATROVENT) 0.06 % nasal spray Place 2 sprays into the nose 2 (two) times daily.  15 mL  9  . ketoprofen (ORUDIS) 75 MG capsule Take 1 capsule (75 mg total) by mouth 4 (four) times daily as needed.  30 capsule  5  . montelukast (SINGULAIR) 10 MG tablet TAKE 1 TABLET BY MOUTH EVERY NIGHT AT BEDTIME  30 tablet  11  . nitroGLYCERIN (NITRODUR - DOSED IN MG/24 HR) 0.1 mg/hr patch Place 1 patch onto the skin daily.      . rizatriptan (MAXALT-MLT) 10 MG disintegrating tablet AS DIRECTED  9  tablet  5  . sildenafil (VIAGRA) 100 MG tablet Take 1/2 to 1 tablet by mouth up to 12 hr before needed  5 tablet  11  . Testosterone (AXIRON) 30 MG/ACT SOLN Apply 2 pumps on each arm daily  90 mL  3  . zolpidem (AMBIEN) 10 MG tablet TAKE 1 TABLET BY MOUTH ONCE DAILY AT BEDTIME AS NEEDED FOR SLEEP  30 tablet  5  . emtricitabine-tenofovir (TRUVADA) 200-300 MG per tablet Take 1 tablet by mouth daily.  30 tablet  11   No current facility-administered medications for this visit.    Review of Systems Review of Systems  Constitutional: Negative for fever, chills and unexpected weight change.  HENT: Negative for congestion, hearing loss, sore throat, trouble swallowing and voice change.   Eyes: Negative for visual disturbance.  Respiratory: Positive for wheezing. Negative for cough.   Cardiovascular: Negative for chest pain, palpitations and leg swelling.  Gastrointestinal: Positive for abdominal pain. Negative for nausea, vomiting, diarrhea, constipation, blood in stool, abdominal distention, anal bleeding and rectal pain.  Genitourinary: Negative for hematuria and difficulty urinating.  Musculoskeletal: Negative for arthralgias.  Skin: Negative for rash and wound.  Neurological: Positive for headaches. Negative for seizures, syncope and weakness.  Hematological: Negative for adenopathy. Does not bruise/bleed easily.  Psychiatric/Behavioral: Negative for confusion.    Blood pressure 130/90, pulse 78, temperature 98.1 F (36.7 C), resp. rate 14, height 5\' 11"  (1.803 m), weight 239 lb 3.2 oz (108.5 kg).  Physical Exam Physical Exam  Vitals reviewed. Constitutional: He appears well-developed and well-nourished.  Cardiovascular: Normal rate, regular rhythm and normal heart sounds.   Pulmonary/Chest: Effort normal and breath sounds normal. He has no wheezes.  Abdominal: Soft. Bowel sounds are normal. He exhibits no distension. There is tenderness (mild) in the right upper quadrant. No hernia.        Data Reviewed Notes from pcp, prior hida/us  Assessment    Abdominal wall masses RUQ pain    Plan    I think masses are likely lipomas and the larger one Im not sure is a mass. This may just be due to weight loss as is difficult for me to feel something discrete.  He also sounds like he has been having gb issues.  I think best plan would be to get ct to start to evaluate these possible masses and this may tell if there are any changes in his gb.  If this is negative for gb we may consider further evaluation.  I will see back after the ct scan.       Norman Bier 04/14/2014, 9:49 PM

## 2014-04-15 ENCOUNTER — Encounter: Payer: Self-pay | Admitting: Internal Medicine

## 2014-04-15 DIAGNOSIS — E291 Testicular hypofunction: Secondary | ICD-10-CM

## 2014-04-16 ENCOUNTER — Ambulatory Visit
Admission: RE | Admit: 2014-04-16 | Discharge: 2014-04-16 | Disposition: A | Discharge status: Home / Self Care | Payer: BC Managed Care – PPO | Source: Ambulatory Visit | Attending: General Surgery | Admitting: General Surgery

## 2014-04-16 DIAGNOSIS — D171 Benign lipomatous neoplasm of skin and subcutaneous tissue of trunk: Secondary | ICD-10-CM

## 2014-04-16 MED ORDER — IOHEXOL 300 MG/ML  SOLN
125.0000 mL | Freq: Once | INTRAMUSCULAR | Status: AC | PRN
  Administered 2014-04-16: 125 mL via INTRAVENOUS

## 2014-04-23 ENCOUNTER — Telehealth (INDEPENDENT_AMBULATORY_CARE_PROVIDER_SITE_OTHER): Payer: Self-pay | Admitting: General Surgery

## 2014-04-23 NOTE — Telephone Encounter (Signed)
LMOM asking pt to return my call.  This is so that we may inform him that the Ct showed only small lipomas, no gallstones, and no abdominal masses are notes.  Dr. Donne Hazel clarified that he would be happy to remove the small lipomas if he would like.

## 2014-04-23 NOTE — Telephone Encounter (Signed)
Message copied by Flossie Buffy on Fri Apr 23, 2014  4:51 PM ------      Message from: Joya San      Created: Fri Apr 23, 2014  3:40 PM      Contact: [phone]       Pt had testing done over a week ago and he was calling wanting to know his results of test can you help. ------

## 2014-04-26 NOTE — Telephone Encounter (Signed)
Informed pt of his CT results. Pt states that he will think about having lipomas removed and he will let us know what he decides.

## 2014-05-01 NOTE — Telephone Encounter (Signed)
At his request will refer to Dr Buddy Duty Patient Active Problem List   Diagnosis Date Noted  . rotator cuff tear 10/17/2013  . Hypogonadism male 09/04/2013  . ED (erectile dysfunction) 07/30/2013  . Palpitations 02/21/2012  . BMI 33.0-33.9,adult 02/04/2012  . Allergic rhinitis 02/04/2012  . GERD (gastroesophageal reflux disease) 02/04/2012  . Migraine 02/04/2012  . GAD (generalized anxiety disorder) 02/04/2012  . Insomnia 02/04/2012  . HYPERTENSION, BENIGN 04/13/2009  . CHEST PAIN-UNSPECIFIED 04/13/2009

## 2014-05-02 ENCOUNTER — Other Ambulatory Visit: Payer: Self-pay | Admitting: Internal Medicine

## 2014-05-02 NOTE — Telephone Encounter (Signed)
Faxed

## 2014-05-10 ENCOUNTER — Telehealth: Payer: Self-pay

## 2014-05-10 NOTE — Telephone Encounter (Signed)
Patient states Dr. Laney Pastor mentioned referring him to Dr. Lorie Apley. Please return call and advise as patient has not yet heard anything about his appt.

## 2014-05-10 NOTE — Telephone Encounter (Signed)
Advised pt referral has been completed. He was given information for Dr. Cindra Eves office.

## 2014-05-19 ENCOUNTER — Encounter: Payer: Self-pay | Admitting: Endocrinology

## 2014-05-21 ENCOUNTER — Encounter: Payer: Self-pay | Admitting: *Deleted

## 2014-05-25 ENCOUNTER — Other Ambulatory Visit: Payer: Self-pay | Admitting: *Deleted

## 2014-05-31 ENCOUNTER — Other Ambulatory Visit: Payer: Self-pay | Admitting: *Deleted

## 2014-05-31 ENCOUNTER — Other Ambulatory Visit: Payer: BC Managed Care – PPO

## 2014-05-31 DIAGNOSIS — E291 Testicular hypofunction: Secondary | ICD-10-CM

## 2014-06-02 LAB — TESTOSTERONE, FREE, TOTAL, SHBG
TESTOSTERONE FREE: 7.5 pg/mL (ref 6.8–21.5)
TESTOSTERONE, TOTAL: 230.2 ng/dL — AB (ref 348.0–1197.0)

## 2014-06-04 ENCOUNTER — Telehealth: Payer: Self-pay

## 2014-06-04 ENCOUNTER — Ambulatory Visit (INDEPENDENT_AMBULATORY_CARE_PROVIDER_SITE_OTHER): Payer: BC Managed Care – PPO | Admitting: Endocrinology

## 2014-06-04 ENCOUNTER — Encounter: Payer: Self-pay | Admitting: Endocrinology

## 2014-06-04 VITALS — BP 121/87 | HR 78 | Temp 98.0°F | Resp 16 | Ht 71.5 in | Wt 237.0 lb

## 2014-06-04 DIAGNOSIS — E230 Hypopituitarism: Secondary | ICD-10-CM | POA: Insufficient documentation

## 2014-06-04 DIAGNOSIS — E291 Testicular hypofunction: Secondary | ICD-10-CM

## 2014-06-04 DIAGNOSIS — E669 Obesity, unspecified: Secondary | ICD-10-CM

## 2014-06-04 MED ORDER — LORCASERIN HCL 10 MG PO TABS: 10.0000 mg | ORAL_TABLET | Freq: Two times a day (BID) | ORAL | Status: DC

## 2014-06-04 NOTE — Telephone Encounter (Signed)
Called pt and gave him Dr Doolittle's suggestions. He agreed and will think about all of this and get back in touch w/Dr Laney Pastor if he would like any referrals.

## 2014-06-04 NOTE — Telephone Encounter (Signed)
[facility] sent a fax asking for change to generic Nexium to save pt some money on co-pay. Called pt to discuss, and he is happy to try the generic in order to save some money, and he will contact me if it doesn't work as well for him. Giving form to Dr Laney Pastor to sign.  Also, Dr Laney Pastor, pt wanted me to let you know that he saw Dr Dwyane Dee today while waiting to get in to see Dr Buddy Duty in November. He was not happy with the visit and stated that Dr Dwyane Dee told him that "he might just have to be happy at 230 lbs". Dr Dwyane Dee did Rx a diet medication for him and pt wanted to get your opinion on the medications safety considering his family hx of medical issues. He stated that you know him and his family well and he will try this medication if you approve. Please advise.

## 2014-06-04 NOTE — Telephone Encounter (Signed)
Signed This new med is unknown to me. I would only recommend it if he can recheck with Dr Dwyane Dee if he has side effects/trouble---the list of side effects is long!!!! I still favor continuimg to increase calorie burnoff and decrease calorie intake rather than meds!! Could increase diet effectiveness with cognitive behavioral therapy!!! Consider hypnotism. Refer to a nutritionist for strict monitoring.

## 2014-06-04 NOTE — Progress Notes (Signed)
Patient ID: Randall Reed, male   DOB: 03/31/1971, 43 y.o.   MRN: [id number]   Chief complaint:  followup of hypogonadism  History of Present Illness:  Previous history: The patient has had difficulty with erectile dysfunction since about 2013 Also has had fatigue and decreased libido off and on especially since 3/14 In the last few years he has been able to get his weight down with diet and exercise; baseline weight was about 290 pounds He has been told to have a low testosterone level but review of his records indicate that his free testosterone was normal in 08/2012   Recent history:  Before starting treatment he had complaints of occasional fatigue and on some days he would be feeling exhausted.   He also had decreased libido.  His free testosterone level was in the normal range at 72 although his total testosterone was slightly low at 260 Because of symptoms he was started on AndroGel   With this he had improved libido and overall energy level However his testosterone level has been fluctuating even with the same doses. Also he probably had a falsely high level from a different lab done by his PCP  Because of inconsistent testosterone level he was supposed to switch to [place] but this was not filled by his pharmacy in May After prior authorization he has started taking this since 7/22 He still thinks he feels tired during the day with no energy and is concerned about this Most recent the free testosterone level is in the normal range although still low normal  LAB results:  Appointment on 05/31/2014  Component Date Value Ref Range Status  . Testosterone, total 05/31/2014 230.2* 348.0 - 1197.0 ng/dL Final  . Testosterone, Free 05/31/2014 7.5  6.8 - 21.5 pg/mL Final    Past Medical History  Diagnosis Date  . GERD (gastroesophageal reflux disease)   . Anxiety   . Seasonal allergic reaction   . Depression   . Arthritis   . Asthma   . Hypertension     no longer being treated  since weight loss    Past Surgical History  Procedure Laterality Date  . None    . None      Family History  Problem Relation Age of Onset  . Hypertension    . Heart attack    . Cancer    . Breast cancer    . Hyperlipidemia    . Coronary artery disease Father 55    stents, CABG  . Heart attack Father   . Coronary artery disease Paternal Grandfather   . Heart attack Maternal Grandfather     Social History:  reports that he has never smoked. He does not have any smokeless tobacco history on file. He reports that he drinks about 1.5 ounces of alcohol per week. He reports that he does not use illicit drugs.  Allergies:  Allergies  Allergen Reactions  . Desloratadine Other (See Comments)    CLARINEX-"severe headache"  . Hydrocod Polst-Cpm Polst Er Itching  . Loratadine Other (See Comments)    "severe headache"  . Telithromycin   . Tussionex Pennkinetic Er [Hydrocod Polst-Cpm Polst Er] Itching  . Levbid [Hyoscyamine Sulfate] Rash      Medication List       This list is accurate as of: 06/04/14  8:16 AM.  Always use your most recent med list.               acetic acid-hydrocortisone otic solution  Commonly known as:  VOSOL-HC  Place 3 drops into both ears 3 (three) times daily. Use as needed to prevent swimmer's ear     albuterol 108 (90 BASE) MCG/ACT inhaler  Commonly known as:  PROVENTIL HFA;VENTOLIN HFA  Inhale 2 puffs into the lungs every 6 (six) hours as needed.     ALPRAZolam 1 MG tablet  Commonly known as:  XANAX  TAKE 1 TABLET BY MOUTH THREE TIMES DAILY AS NEEDED     clidinium-chlordiazePOXIDE 5-2.5 MG per capsule  Commonly known as:  LIBRAX  Take 1 capsule by mouth 2 (two) times daily.     diclofenac 75 MG EC tablet  Commonly known as:  VOLTAREN  Take 1 tablet (75 mg total) by mouth 2 (two) times daily.     esomeprazole 40 MG capsule  Commonly known as:  NEXIUM  Take 1 capsule (40 mg total) by mouth daily before breakfast.      fexofenadine-pseudoephedrine 180-240 MG per 24 hr tablet  Commonly known as:  ALLEGRA-D 24 HOUR  Take 1 tablet by mouth daily.     fluticasone 50 MCG/ACT nasal spray  Commonly known as:  FLONASE  Place 2 sprays into both nostrils daily.     hydrocortisone 2.5 % rectal cream  Commonly known as:  ANUSOL-HC  Place rectally 2 (two) times daily.     ipratropium 0.06 % nasal spray  Commonly known as:  ATROVENT  Place 2 sprays into the nose 2 (two) times daily.     ipratropium 17 MCG/ACT inhaler  Commonly known as:  ATROVENT HFA  INHALE 2 PUFFS INTO THE LUNGS TWICE DAILY OR AS DIRECTED     ketoprofen 75 MG capsule  Commonly known as:  ORUDIS  Take 1 capsule (75 mg total) by mouth 4 (four) times daily as needed.     montelukast 10 MG tablet  Commonly known as:  SINGULAIR  TAKE 1 TABLET BY MOUTH EVERY NIGHT AT BEDTIME     rizatriptan 10 MG disintegrating tablet  Commonly known as:  MAXALT-MLT  AS DIRECTED     sildenafil 100 MG tablet  Commonly known as:  VIAGRA  Take 1/2 to 1 tablet by mouth up to 12 hr before needed     Testosterone 30 MG/ACT Soln  - Apply 2 pumps on each arm daily   - Patient needs 2 boxes for a 30 day supply     zolpidem 10 MG tablet  Commonly known as:  AMBIEN  TAKE 1 TABLET BY MOUTH ONCE DAILY AT BEDTIME AS NEEDED FOR SLEEP          REVIEW OF SYSTEMS:        He is very concerned about his difficulty with weight loss, has gained back significant amount of weight urine with regular exercise and low-calorie low carbohydrate meals. He has a small bedtime snack with protein  Wt Readings from Last 3 Encounters:  06/04/14 237 lb (107.502 kg)  04/13/14 239 lb 3.2 oz (108.5 kg)  03/22/14 233 lb (105.688 kg)     Has tried Ambien for sleep and usually taking this regularly. He does not think his energy level is limited to his insomnia   Free T4 was normal ruling out secondary hypothyroidism  He has been consistent relief of his erectile dysfunction  problem with Viagra which is the preferred drug with his insurance. Was doing better with Cialis  Has not had any hyperglycemia, dyslipidemia or hypertension. However he is very concerned about his strong family history of coronary artery  disease  PHYSICAL EXAM:  BP 121/87  Pulse 78  Temp(Src) 98 F (36.7 C)  Resp 16  Ht 5' 11.5" (1.816 m)  Wt 237 lb (107.502 kg)  BMI 32.60 kg/m2  SpO2 95%  Abdominal obesity present  ASSESSMENT/PLAN:   Secondary hypogonadism, mild with symptoms of intermittent fatigue, decreased libido and erectile dysfunction  His symptoms seem to be out of proportion to his free testosterone levels which have not been consistently low  He is applying the Axiron over the last week or so and will need followup level. Have discussed in detail application technique since he is putting on the deodorant after doing the testosterone  Weight gain: Discussed in detail that this is likely to be from his genetic tendency to gain weight and reset metabolism after his major weight loss Most likely he will benefit from pharmacological treatment of obesity He also has high normal blood pressure today Discussed how Belviq would benefit him and possible side effects and he will start this, followup in 2 months Prior authorization  will also be done for this  Counseling time and coordination of care over 50% of today's 25 minute visit  [place] 06/04/2014, 8:16 AM

## 2014-06-08 ENCOUNTER — Encounter: Payer: Self-pay | Admitting: Endocrinology

## 2014-06-22 ENCOUNTER — Ambulatory Visit (INDEPENDENT_AMBULATORY_CARE_PROVIDER_SITE_OTHER): Payer: BC Managed Care – PPO | Admitting: Internal Medicine

## 2014-06-22 VITALS — BP 128/80 | HR 80 | Temp 98.3°F | Resp 18 | Ht 69.5 in | Wt 232.0 lb

## 2014-06-22 DIAGNOSIS — G43709 Chronic migraine without aura, not intractable, without status migrainosus: Secondary | ICD-10-CM

## 2014-06-22 DIAGNOSIS — R002 Palpitations: Secondary | ICD-10-CM

## 2014-06-22 DIAGNOSIS — Z23 Encounter for immunization: Secondary | ICD-10-CM

## 2014-06-22 DIAGNOSIS — Z8249 Family history of ischemic heart disease and other diseases of the circulatory system: Secondary | ICD-10-CM

## 2014-06-22 DIAGNOSIS — K2100 Gastro-esophageal reflux disease with esophagitis, without bleeding: Secondary | ICD-10-CM

## 2014-06-22 DIAGNOSIS — F411 Generalized anxiety disorder: Secondary | ICD-10-CM

## 2014-06-22 DIAGNOSIS — N481 Balanitis: Secondary | ICD-10-CM

## 2014-06-22 DIAGNOSIS — N476 Balanoposthitis: Secondary | ICD-10-CM

## 2014-06-22 MED ORDER — RIZATRIPTAN BENZOATE 10 MG PO TBDP: ORAL_TABLET | ORAL | Status: DC

## 2014-06-22 MED ORDER — KETOCONAZOLE 2 % EX CREA: 1.0000 "application " | TOPICAL_CREAM | Freq: Every day | CUTANEOUS | Status: DC

## 2014-06-22 MED ORDER — ALPRAZOLAM 1 MG PO TABS: ORAL_TABLET | ORAL | Status: DC

## 2014-06-22 MED ORDER — KETOPROFEN 75 MG PO CAPS: 75.0000 mg | ORAL_CAPSULE | Freq: Four times a day (QID) | ORAL | Status: DC | PRN

## 2014-06-22 MED ORDER — OMEPRAZOLE-SODIUM BICARBONATE 40-1100 MG PO CAPS: ORAL_CAPSULE | ORAL | Status: DC

## 2014-06-22 NOTE — Progress Notes (Addendum)
Subjective:  This chart was scribed for Randall Koyanagi, MD by Ladene Artist, ED Scribe. The patient was seen in room 1. Patient's care was started at 9:13 AM.   Patient ID: Johnney Ou, male    DOB: 09/18/1971, 43 y.o.   MRN: [id number]  Chief Complaint  Patient presents with  . Medication Refill    xanax,Rizatriptan,Ketoprofen   HPI HPI Comments: Lee Kuang is a 43 y.o. male, with a h/o anxiety, who presents to the [facility] for medication refill of Xanax, Rizatriptan and Ketoprofen. Pt states that he uses Ketoprofen at onset of HA with relief. Pt reports having more HAs than usual that he attributes to an increase in stress.   Pt also presents with itching to his genitalia over the past week. He reports associated redness and swelling.  Pt states that GERD is gradually worsening. He reports associated nausea and emesis upon waking. Pt states that he had a protein shake a few days ago and had 1 episode of emesis 5 minutes afterwards. Pt eats dinner around 8:30 PM. He is currently taking Nexium. Last endo was ok=no stricture.  Pt states that he has not changed Endocrinologist yet; can not get an appointment until 09/2014.   Pt's aunt, age 17, (had the same type of heart attack that his dad did )1 month ago. Pt's great grandfather also had the same type of heart attack. Pt reports rapid heart rate, "jittery" feeling and SOB with exercising. Pt tries to execise 3 days per week, x2 a day. He has added running to his routine. Pt states that he has lost 10 lbs. Last echocardiogram was 02/2012=wnl.   Pt also requests Hepatitis immunizations during this visit.   Past Medical History  Diagnosis Date  . GERD (gastroesophageal reflux disease)   . Anxiety   . Seasonal allergic reaction   . Depression   . Arthritis   . Asthma   . Hypertension     no longer being treated since weight loss   Current Outpatient Prescriptions on File Prior to Visit  Medication Sig  Dispense Refill  . acetic acid-hydrocortisone (VOSOL-HC) otic solution Place 3 drops into both ears 3 (three) times daily. Use as needed to prevent swimmer's ear  10 mL  0  . albuterol (PROVENTIL HFA;VENTOLIN HFA) 108 (90 BASE) MCG/ACT inhaler Inhale 2 puffs into the lungs every 6 (six) hours as needed.  1 Inhaler  5  . ALPRAZolam (XANAX) 1 MG tablet TAKE 1 TABLET BY MOUTH THREE TIMES DAILY AS NEEDED  90 tablet  0  . clidinium-chlordiazePOXIDE (LIBRAX) 5-2.5 MG per capsule Take 1 capsule by mouth 2 (two) times daily.  60 capsule  5  . diclofenac (VOLTAREN) 75 MG EC tablet Take 1 tablet (75 mg total) by mouth 2 (two) times daily.  60 tablet  5  . esomeprazole (NEXIUM) 40 MG capsule Take 1 capsule (40 mg total) by mouth daily before breakfast.  30 capsule  11  . fexofenadine-pseudoephedrine (ALLEGRA-D 24 HOUR) 180-240 MG per 24 hr tablet Take 1 tablet by mouth daily.  30 tablet  11  . fluticasone (FLONASE) 50 MCG/ACT nasal spray Place 2 sprays into both nostrils daily.  16 g  9  . hydrocortisone (ANUSOL-HC) 2.5 % rectal cream Place rectally 2 (two) times daily.  30 g  3  . ipratropium (ATROVENT HFA) 17 MCG/ACT inhaler INHALE 2 PUFFS INTO THE LUNGS TWICE DAILY OR AS DIRECTED  12.9 g  3  .  ipratropium (ATROVENT) 0.06 % nasal spray Place 2 sprays into the nose 2 (two) times daily.  15 mL  9  . ketoprofen (ORUDIS) 75 MG capsule Take 1 capsule (75 mg total) by mouth 4 (four) times daily as needed.  30 capsule  5  . montelukast (SINGULAIR) 10 MG tablet TAKE 1 TABLET BY MOUTH EVERY NIGHT AT BEDTIME  30 tablet  11  . rizatriptan (MAXALT-MLT) 10 MG disintegrating tablet AS DIRECTED  9 tablet  5  . sildenafil (VIAGRA) 100 MG tablet Take 1/2 to 1 tablet by mouth up to 12 hr before needed  5 tablet  11  . Testosterone 30 MG/ACT SOLN Apply 2 pumps on each arm daily  Patient needs 2 boxes for a 30 day supply      . zolpidem (AMBIEN) 10 MG tablet TAKE 1 TABLET BY MOUTH ONCE DAILY AT BEDTIME AS NEEDED FOR SLEEP  30  tablet  5   No current facility-administered medications on file prior to visit.   Allergies  Allergen Reactions  . Desloratadine Other (See Comments)    CLARINEX-"severe headache"  . Hydrocod Polst-Cpm Polst Er Itching  . Loratadine Other (See Comments)    "severe headache"  . Telithromycin   . Tussionex Pennkinetic Er [Hydrocod Polst-Cpm Polst Er] Itching  . Levbid [Hyoscyamine Sulfate] Rash   Review of Systems  Constitutional: Negative for fever and fatigue.  Gastrointestinal: Positive for nausea and vomiting.  Genitourinary: Positive for penile swelling.   anxiety over family issues and caring for her grandmother continued to cause stress and anxiety but he responds well to his medications   recent 10 pound weight loss by increasing his exercise Objective:   Physical Exam  Nursing note and vitals reviewed. Constitutional: He is oriented to person, place, and time. He appears well-developed and well-nourished.  HENT:  Head: Normocephalic and atraumatic.  Eyes: Conjunctivae and EOM are normal.  Neck: Neck supple.  Cardiovascular: Normal rate, regular rhythm and normal heart sounds.   No murmur heard. Pulmonary/Chest: Effort normal.  Genitourinary:  Redness and swelling of foreskin  Musculoskeletal: Normal range of motion. He exhibits no edema.  Neurological: He is alert and oriented to person, place, and time.  Skin: Skin is warm and dry.  Psychiatric: He has a normal mood and affect. His behavior is normal.  Triage Vitals: BP 128/80  Pulse 80  Temp(Src) 98.3 F (36.8 C) (Oral)  Resp 18  Ht 5' 9.5" (1.765 m)  Wt 232 lb (105.235 kg)  BMI 33.78 kg/m2  SpO2 98%    Assessment & Plan:   I personally performed the services described in this documentation, which was scribed in my presence. The recorded information has been reviewed and is accurate. Palpitations - Plan: Ambulatory referral to Cardiology  Fam hx-ischem heart disease - Plan: Ambulatory referral to  Cardiology  Chronic migraine without aura without status migrainosus, not intractable - Plan: rizatriptan (MAXALT-MLT) 10 MG disintegrating tablet, ketoprofen (ORUDIS) 75 MG capsule  Gastroesophageal reflux disease with esophagitis  Need for hepatitis B vaccination  Need for hepatitis A vaccination  GAD (generalized anxiety disorder)  Balanitis  Meds ordered this encounter  Medications  . omeprazole-sodium bicarbonate (ZEGERID) 40-1100 MG per capsule    Sig: 1 hs    Dispense:  30 capsule    Refill:  0  . rizatriptan (MAXALT-MLT) 10 MG disintegrating tablet    Sig: AS DIRECTED    Dispense:  9 tablet    Refill:  5  . ALPRAZolam [name])  1 MG tablet    Sig: TAKE 1 TABLET BY MOUTH THREE TIMES DAILY AS NEEDED    Dispense:  90 tablet    Refill:  5  . ketoprofen (ORUDIS) 75 MG capsule    Sig: Take 1 capsule (75 mg total) by mouth 4 (four) times daily as needed.    Dispense:  30 capsule    Refill:  5  . ketoconazole (NIZORAL) 2 % cream    Sig: Apply 1 application topically daily.    Dispense:  15 g    Refill:  0   Hep a/b #2 and #3 add Zegerid hs for one month and if relapses we'll proceed to endoscopy to rule out stricture or other complicating process Refer to cardiology to consider potential for single vessel hereditary lesion Continue meds for anxiety and migraine headache Return if no response to antifungal's

## 2014-07-08 ENCOUNTER — Encounter: Payer: Self-pay | Admitting: Internal Medicine

## 2014-07-08 MED ORDER — CLOTRIMAZOLE-BETAMETHASONE 1-0.05 % EX CREA: 1.0000 "application " | TOPICAL_CREAM | Freq: Two times a day (BID) | CUTANEOUS | Status: DC

## 2014-07-08 NOTE — Telephone Encounter (Signed)
Chg to  Meds ordered this encounter  Medications  . clotrimazole-betamethasone (LOTRISONE) cream    Sig: Apply 1 application topically 2 (two) times daily.    Dispense:  30 g    Refill:  1

## 2014-07-26 ENCOUNTER — Other Ambulatory Visit: Payer: Self-pay | Admitting: Internal Medicine

## 2014-07-26 ENCOUNTER — Institutional Professional Consult (permissible substitution): Payer: BC Managed Care – PPO | Admitting: Cardiology

## 2014-08-02 ENCOUNTER — Other Ambulatory Visit: Payer: BC Managed Care – PPO

## 2014-08-05 ENCOUNTER — Ambulatory Visit: Payer: BC Managed Care – PPO | Admitting: Endocrinology

## 2014-08-27 ENCOUNTER — Other Ambulatory Visit: Payer: Self-pay | Admitting: Physician Assistant

## 2014-08-29 ENCOUNTER — Ambulatory Visit (INDEPENDENT_AMBULATORY_CARE_PROVIDER_SITE_OTHER): Payer: BC Managed Care – PPO | Admitting: Internal Medicine

## 2014-08-29 VITALS — BP 122/82 | HR 84 | Temp 97.9°F | Resp 18 | Ht 71.5 in | Wt 236.6 lb

## 2014-08-29 DIAGNOSIS — J4520 Mild intermittent asthma, uncomplicated: Secondary | ICD-10-CM

## 2014-08-29 DIAGNOSIS — J3089 Other allergic rhinitis: Secondary | ICD-10-CM

## 2014-08-29 DIAGNOSIS — J0100 Acute maxillary sinusitis, unspecified: Secondary | ICD-10-CM

## 2014-08-29 MED ORDER — ALBUTEROL SULFATE HFA 108 (90 BASE) MCG/ACT IN AERS: 2.0000 | INHALATION_SPRAY | Freq: Four times a day (QID) | RESPIRATORY_TRACT | Status: DC | PRN

## 2014-08-29 MED ORDER — HYDROCODONE-HOMATROPINE 5-1.5 MG/5ML PO SYRP: 5.0000 mL | ORAL_SOLUTION | Freq: Four times a day (QID) | ORAL | Status: DC | PRN

## 2014-08-29 NOTE — Progress Notes (Signed)
   Subjective:    Patient ID: Randall Reed, male    DOB: [birth year], 43 y.o.   MRN: [id number]  HPI complaining of persistent cough that interferes with activity and sleep [facility] with amoxicillin/clavulanate He had been suffering from purulent discharge with maxillary pressure and congestion for over 1 week His allergy symptoms have increased over the past 10-14 days following the recent rain  Current medications include albuterol, Allegra, Flonase, Atrovent inhaler, Atrovent nasal spray  He has been seeing endocrinologist Dr Buddy Duty and is now on injectable testosterone every 2 weeks   Review of Systems No fever chills or night sweats Cough nonproductive Unsure if wheezing Unsure if using both inhalers    Objective:   Physical Exam BP 122/82  Pulse 84  Temp(Src) 97.9 F (36.6 C) (Oral)  Resp 18  Ht 5' 11.5" (1.816 m)  Wt 236 lb 9.6 oz (107.321 kg)  BMI 32.54 kg/m2  SpO2 97% Boggy turbinates and injected conjunctiva but otherwise clear HEENT Lungs with no wheezing on forced expiration       Assessment & Plan:  Reactive airway disease exacerbated by acute sinusitis partially treated Cough most annoying  Meds ordered this encounter  Medications  . amoxicillin-clavulanate (AUGMENTIN) 875-125 MG per tablet----continued     Sig: Take 1 tablet by mouth 2 (two) times daily.  . benzonatate (TESSALON) 100 MG capsule--- continued     Sig: Take 100 mg by mouth 3 (three) times daily as needed for cough.  Marland Kitchen albuterol (PROVENTIL HFA;VENTOLIN HFA) 108 (90 BASE) MCG/ACT inhaler--refilled     Sig: Inhale 2 puffs into the lungs every 6 (six) hours as needed.    Dispense:  1 Inhaler    Refill:  5  . HYDROcodone-homatropine (HYCODAN) 5-1.5 MG/5ML syrup----added     Sig: Take 5 mLs by mouth every 6 (six) hours as needed.    Dispense:  120 mL    Refill:  0

## 2014-08-31 ENCOUNTER — Institutional Professional Consult (permissible substitution): Payer: BC Managed Care – PPO | Admitting: Cardiovascular Disease

## 2014-09-01 ENCOUNTER — Encounter: Payer: Self-pay | Admitting: Internal Medicine

## 2014-09-03 ENCOUNTER — Telehealth: Payer: Self-pay

## 2014-09-03 ENCOUNTER — Institutional Professional Consult (permissible substitution): Payer: BC Managed Care – PPO | Admitting: Cardiovascular Disease

## 2014-09-03 MED ORDER — HYDROCODONE-HOMATROPINE 5-1.5 MG/5ML PO SYRP: 5.0000 mL | ORAL_SOLUTION | Freq: Four times a day (QID) | ORAL | Status: DC | PRN

## 2014-09-03 NOTE — Telephone Encounter (Signed)
Rx is ready for him to pick-up.  If he continues to have the cough and need more of this he should RTC for a recheck.

## 2014-09-03 NOTE — Telephone Encounter (Signed)
Lm to advise pt. 

## 2014-09-03 NOTE — Telephone Encounter (Signed)
Pt is still experiencing a bad cough, would like to know if he can have a refill of the  HYDROcodone-homatropine (HYCODAN) 5-1.5 MG/5ML syrup [[id number]], please advise pt

## 2014-09-06 ENCOUNTER — Ambulatory Visit (INDEPENDENT_AMBULATORY_CARE_PROVIDER_SITE_OTHER): Payer: BC Managed Care – PPO | Admitting: Internal Medicine

## 2014-09-06 VITALS — BP 140/80 | HR 91 | Temp 98.1°F | Resp 16 | Ht 71.0 in | Wt 240.0 lb

## 2014-09-06 DIAGNOSIS — R05 Cough: Secondary | ICD-10-CM

## 2014-09-06 DIAGNOSIS — R059 Cough, unspecified: Secondary | ICD-10-CM

## 2014-09-06 DIAGNOSIS — J4520 Mild intermittent asthma, uncomplicated: Secondary | ICD-10-CM

## 2014-09-06 MED ORDER — PREDNISONE 20 MG PO TABS: ORAL_TABLET | ORAL | Status: DC

## 2014-09-06 MED ORDER — HYDROCODONE-HOMATROPINE 5-1.5 MG/5ML PO SYRP: 5.0000 mL | ORAL_SOLUTION | Freq: Four times a day (QID) | ORAL | Status: DC | PRN

## 2014-09-06 MED ORDER — AMOXICILLIN-POT CLAVULANATE 875-125 MG PO TABS: 1.0000 | ORAL_TABLET | Freq: Two times a day (BID) | ORAL | Status: DC

## 2014-09-06 NOTE — Progress Notes (Signed)
Subjective:  This chart was scribed for Randall Koyanagi, MD by Randall Reed, ED Scribe. The patient was seen in room 1. Patient's care was started at 4:46 PM.   Patient ID: Randall Reed, male    DOB: 01/27/1971, 43 y.o.   MRN: [id number]  Chief Complaint  Patient presents with  . Follow-up    RAD   HPI HPI Comments: Randall Reed is a 43 y.o. male who presents to the [facility] for follow-up regarding URI. Pt reports that is still experiencing productive cough with white and yellow mucous, congestion, postnasal drip, back pain that he attributes to coughing. Pt states that persistent cough wakes him out of his sleep. He denies fever, chills, night sweats. Pt has tried Mucinex D, Flonase, Atrovent, Allegra, Singulair and 1.5 bottles of cough syrup without relief.   Past Medical History  Diagnosis Date  . GERD (gastroesophageal reflux disease)   . Anxiety   . Seasonal allergic reaction   . Depression   . Arthritis   . Asthma    Current Outpatient Prescriptions on File Prior to Visit  Medication Sig Dispense Refill  . acetic acid-hydrocortisone (VOSOL-HC) otic solution Place 3 drops into both ears 3 (three) times daily. Use as needed to prevent swimmer's ear 10 mL 0  . albuterol (PROVENTIL HFA;VENTOLIN HFA) 108 (90 BASE) MCG/ACT inhaler Inhale 2 puffs into the lungs every 6 (six) hours as needed. 1 Inhaler 5  . ALPRAZolam (XANAX) 1 MG tablet TAKE 1 TABLET BY MOUTH THREE TIMES DAILY AS NEEDED 90 tablet 5  . amoxicillin-clavulanate (AUGMENTIN) 875-125 MG per tablet Take 1 tablet by mouth 2 (two) times daily.    . benzonatate (TESSALON) 100 MG capsule Take 100 mg by mouth 3 (three) times daily as needed for cough.    . clidinium-chlordiazePOXIDE (LIBRAX) 5-2.5 MG per capsule Take 1 capsule by mouth 2 (two) times daily. 60 capsule 5  . clotrimazole-betamethasone (LOTRISONE) cream Apply 1 application topically 2 (two) times daily. 30 g 1  . diclofenac (VOLTAREN) 75  MG EC tablet Take 1 tablet (75 mg total) by mouth 2 (two) times daily. 60 tablet 5  . esomeprazole (NEXIUM) 40 MG capsule Take 1 capsule (40 mg total) by mouth daily before breakfast. 30 capsule 11  . fexofenadine-pseudoephedrine (ALLEGRA-D 24 HOUR) 180-240 MG per 24 hr tablet Take 1 tablet by mouth daily. 30 tablet 11  . fluticasone (FLONASE) 50 MCG/ACT nasal spray Place 2 sprays into both nostrils daily. 16 g 9  . HYDROcodone-homatropine (HYCODAN) 5-1.5 MG/5ML syrup Take 5 mLs by mouth every 6 (six) hours as needed. 120 mL 0  . hydrocortisone (ANUSOL-HC) 2.5 % rectal cream Place rectally 2 (two) times daily. 30 g 3  . ipratropium (ATROVENT HFA) 17 MCG/ACT inhaler INHALE 2 PUFFS INTO THE LUNGS TWICE DAILY OR AS DIRECTED 12.9 g 3  . ipratropium (ATROVENT) 0.06 % nasal spray Place 2 sprays into the nose 2 (two) times daily. 15 mL 9  . ketoconazole (NIZORAL) 2 % cream Apply 1 application topically daily. 15 g 0  . ketoprofen (ORUDIS) 75 MG capsule Take 1 capsule (75 mg total) by mouth 4 (four) times daily as needed. 30 capsule 5  . montelukast (SINGULAIR) 10 MG tablet TAKE 1 TABLET BY MOUTH EVERY NIGHT AT BEDTIME 30 tablet 11  . omeprazole-sodium bicarbonate (ZEGERID) 40-1100 MG per capsule TAKE 1 CAPSULE BY MOUTH EVERY NIGHT AT BEDTIME    "NEEDS FOLLOW UP ADDITIONAL REFILLS" 30 capsule 0  .  rizatriptan (MAXALT-MLT) 10 MG disintegrating tablet AS DIRECTED 9 tablet 5  . sildenafil (VIAGRA) 100 MG tablet Take 1/2 to 1 tablet by mouth up to 12 hr before needed 5 tablet 11  . Testosterone 30 MG/ACT SOLN Apply 2 pumps on each arm daily  Patient needs 2 boxes for a 30 day supply    . zolpidem (AMBIEN) 10 MG tablet TAKE 1 TABLET BY MOUTH ONCE DAILY AT BEDTIME AS NEEDED FOR SLEEP 30 tablet 5   No current facility-administered medications on file prior to visit.   Allergies  Allergen Reactions  . Desloratadine Other (See Comments)    CLARINEX-"severe headache"  . Hydrocod Polst-Cpm Polst Er Itching  .  Loratadine Other (See Comments)    "severe headache"  . Telithromycin   . Tussionex Pennkinetic Er [Hydrocod Polst-Cpm Polst Er] Itching  . Levbid [Hyoscyamine Sulfate] Rash   Review of Systems  Constitutional: Negative for fever, chills and diaphoresis.  HENT: Positive for congestion and postnasal drip.   Respiratory: Positive for cough.   Musculoskeletal: Positive for back pain.  Allergic/Immunologic: Positive for environmental allergies.      Objective:   Physical Exam  Constitutional: He is oriented to person, place, and time. He appears well-developed and well-nourished. No distress.  HENT:  Head: Normocephalic and atraumatic.  Nares shows a lot of clear rhinorrhea  Eyes: Conjunctivae and EOM are normal.  Neck: Neck supple.  Cardiovascular: Normal rate.   Pulmonary/Chest: Effort normal. No respiratory distress.  Lungs are clear even though deep breathing precipitates coughing   Musculoskeletal: Normal range of motion.  Lymphadenopathy:    He has no cervical adenopathy.  Neurological: He is alert and oriented to person, place, and time.  Skin: Skin is warm and dry.  Psychiatric: He has a normal mood and affect. His behavior is normal.  Nursing note and vitals reviewed.   Triage Vitals: BP 140/80 mmHg  Pulse 91  Temp(Src) 98.1 F (36.7 C) (Oral)  Resp 16  Ht 5\' 11"  (1.803 m)  Wt 240 lb (108.863 kg)  BMI 33.49 kg/m2  SpO2 97%     Assessment & Plan:   I personally performed the services described in this documentation, which was scribed in my presence. The recorded information has been reviewed and is accurate.  RAD (reactive airway disease), mild intermittent, uncomplicated  Cough  Meds ordered this encounter  Medications  . HYDROcodone-homatropine (HYCODAN) 5-1.5 MG/5ML syrup    Sig: Take 5 mLs by mouth every 6 (six) hours as needed.    Dispense:  120 mL    Refill:  0  . amoxicillin-clavulanate (AUGMENTIN) 875-125 MG per tablet    Sig: Take 1 tablet by  mouth 2 (two) times daily.    Dispense:  20 tablet    Refill:  0  . predniSONE (DELTASONE) 20 MG tablet    Sig: 4/4/3/3/2/2/1/1single daily dose for 8 days    Dispense:  20 tablet    Refill:  0

## 2014-09-15 ENCOUNTER — Encounter: Payer: Self-pay | Admitting: Internal Medicine

## 2014-09-17 ENCOUNTER — Other Ambulatory Visit: Payer: Self-pay | Admitting: Internal Medicine

## 2014-09-17 ENCOUNTER — Ambulatory Visit (INDEPENDENT_AMBULATORY_CARE_PROVIDER_SITE_OTHER): Payer: BC Managed Care – PPO | Admitting: Cardiovascular Disease

## 2014-09-17 ENCOUNTER — Encounter: Payer: Self-pay | Admitting: Cardiovascular Disease

## 2014-09-17 VITALS — BP 118/90 | HR 67 | Ht 71.0 in | Wt 237.4 lb

## 2014-09-17 DIAGNOSIS — R079 Chest pain, unspecified: Secondary | ICD-10-CM

## 2014-09-17 MED ORDER — FLUTICASONE-SALMETEROL 250-50 MCG/DOSE IN AEPB: 1.0000 | INHALATION_SPRAY | Freq: Two times a day (BID) | RESPIRATORY_TRACT | Status: DC

## 2014-09-17 NOTE — Progress Notes (Signed)
HPI:  43 year-old male presenting for cardiac evaluation. The patient's chief complaint is tachycardia and chest discomfort with exercise. He has had episodes of chest pain in the past and has undergone previous cardiac evaluation with an echocardiogram and gated cardiac CTA. His CTA was negative with a coronary Agaston score of zero and no plaque visualized in the coronaries on this 2013 study.   He complains of rapid heart rate with exercise. At times his heart rate is as high as 170 bpm. When this occurs, he does not feel well. He has associated chest discomfort that he describes as a 'pressure' in the center of his chest. He denies dyspnea, edema, lightheadedness, or syncope. Has struggled with his weight, despite a regular and fairly intensive exercise program. He's apparently considering some dietary aids to help with weight loss.  Outpatient Encounter Prescriptions as of 09/17/2014  Medication Sig  . acetic acid-hydrocortisone (VOSOL-HC) otic solution Place 3 drops into both ears 3 (three) times daily. Use as needed to prevent swimmer's ear  . albuterol (PROVENTIL HFA;VENTOLIN HFA) 108 (90 BASE) MCG/ACT inhaler Inhale 2 puffs into the lungs every 6 (six) hours as needed.  . ALPRAZolam (XANAX) 1 MG tablet TAKE 1 TABLET BY MOUTH THREE TIMES DAILY AS NEEDED  . CIALIS 20 MG tablet   . clidinium-chlordiazePOXIDE (LIBRAX) 5-2.5 MG per capsule Take 1 capsule by mouth 2 (two) times daily.  . diclofenac (VOLTAREN) 75 MG EC tablet Take 1 tablet (75 mg total) by mouth 2 (two) times daily.  Marland Kitchen esomeprazole (NEXIUM) 40 MG capsule Take 1 capsule (40 mg total) by mouth daily before breakfast.  . fexofenadine-pseudoephedrine (ALLEGRA-D 24 HOUR) 180-240 MG per 24 hr tablet Take 1 tablet by mouth daily.  . fluticasone (FLONASE) 50 MCG/ACT nasal spray Place 2 sprays into both nostrils daily.  . hydrocortisone (ANUSOL-HC) 2.5 % rectal cream Place rectally 2 (two) times daily.  Marland Kitchen ipratropium (ATROVENT HFA) 17  MCG/ACT inhaler INHALE 2 PUFFS INTO THE LUNGS TWICE DAILY OR AS DIRECTED  . ipratropium (ATROVENT) 0.06 % nasal spray Place 2 sprays into the nose 2 (two) times daily.  Marland Kitchen ketoprofen (ORUDIS) 75 MG capsule Take 1 capsule (75 mg total) by mouth 4 (four) times daily as needed.  . montelukast (SINGULAIR) 10 MG tablet TAKE 1 TABLET BY MOUTH EVERY NIGHT AT BEDTIME  . omeprazole-sodium bicarbonate (ZEGERID) 40-1100 MG per capsule TAKE 1 CAPSULE BY MOUTH EVERY NIGHT AT BEDTIME    "NEEDS FOLLOW UP ADDITIONAL REFILLS"  . rizatriptan (MAXALT-MLT) 10 MG disintegrating tablet AS DIRECTED  . sildenafil (VIAGRA) 100 MG tablet Take 1/2 to 1 tablet by mouth up to 12 hr before needed  . Testosterone 30 MG/ACT SOLN Apply 2 pumps on each arm daily  Patient needs 2 boxes for a 30 day supply  . zolpidem (AMBIEN) 10 MG tablet TAKE 1 TABLET BY MOUTH ONCE DAILY AT BEDTIME AS NEEDED FOR SLEEP  . [DISCONTINUED] amoxicillin-clavulanate (AUGMENTIN) 875-125 MG per tablet Take 1 tablet by mouth 2 (two) times daily.  . [DISCONTINUED] benzonatate (TESSALON) 100 MG capsule   . [DISCONTINUED] clotrimazole-betamethasone (LOTRISONE) cream Apply 1 application topically 2 (two) times daily.  . [DISCONTINUED] HYDROcodone-homatropine (HYCODAN) 5-1.5 MG/5ML syrup Take 5 mLs by mouth every 6 (six) hours as needed.  . [DISCONTINUED] ketoconazole (NIZORAL) 2 % cream Apply 1 application topically daily.  . [DISCONTINUED] predniSONE (DELTASONE) 20 MG tablet 4/4/3/3/2/2/1/1single daily dose for 8 days    Desloratadine; Hydrocod polst-cpm polst er; Loratadine; Telithromycin; Tussionex pennkinetic er;  and Levbid  Past Medical History  Diagnosis Date  . GERD (gastroesophageal reflux disease)   . Anxiety   . Seasonal allergic reaction   . Depression   . Arthritis   . Asthma     Past Surgical History  Procedure Laterality Date  . None    . None      History   Social History  . Marital Status: Single    Spouse Name: N/A     Number of Children: 0  . Years of Education: N/A   Occupational History  . [employer])    Social History Main Topics  . Smoking status: Never Smoker   . Smokeless tobacco: Not on file  . Alcohol Use: 1.5 oz/week    3 drink(s) per week  . Drug Use: No  . Sexual Activity: Yes   Other Topics Concern  . Not on file   Social History Narrative    Family History  Problem Relation Age of Onset  . Hypertension    . Heart attack    . Cancer    . Breast cancer    . Hyperlipidemia    . Coronary artery disease Father 26    stents, CABG  . Heart attack Father   . Coronary artery disease Paternal Grandfather   . Heart attack Maternal Grandfather     ROS:  General: no fevers/chills/night sweats, positive for large weight fluctuations Eyes: no blurry vision, diplopia, or amaurosis ENT: no sore throat or hearing loss Resp: positive for cough and wheezing CV: see HPI GI: no abdominal pain, nausea, vomiting, diarrhea, or constipation GU: no dysuria, frequency, or hematuria Skin: no rash Neuro: positive for headache, negative for numbness, tingling, or weakness of extremities Musculoskeletal: no joint pain or swelling Heme: no bleeding, DVT, or easy bruising Endo: no polydipsia or polyuria  BP 118/90 mmHg  Pulse 67  Ht 5\' 11"  (1.803 m)  Wt 237 lb 6.4 oz (107.684 kg)  BMI 33.13 kg/m2  PHYSICAL EXAM: Pt is alert and oriented, WD, WN, pleasant overweight male in no distress. HEENT: normal Neck: JVP normal. Carotid upstrokes normal without bruits. No thyromegaly. Lungs: equal expansion, clear bilaterally CV: Apex is discrete and nondisplaced, RRR without murmur or gallop Abd: soft, NT, +BS, no bruit, obese Ext: no C/C/E        DP/PT pulses intact and = Skin: warm and dry without rash Neuro: CNII-XII intact             Strength intact = bilaterally  EKG:  NSR 67 bpm, within normal limits  2D Echo 02/21/2012: Study Conclusions  - Left  ventricle: The cavity size was normal. Wall thickness was increased in a pattern of mild LVH. The estimated ejection fraction was 60%. Wall motion was normal; there were no regional wall motion abnormalities. - Left atrium: The atrium was mildly dilated. - Right ventricle: The cavity size was normal. Systolic function was mildly reduced.  Cardiac CTA 02/15/2012: FINDINGS: Technical quality: Excellent  Heart rate: 60 - 65  CORONARY ARTERIES: Left main coronary artery: Negative Left anterior descending: Negative for atherosclerosis. 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 Left circumflex: Negative Right coronary artery: Negative  Posterior descending artery: Negative Dominance: Right  CORONARY CALCIUM:  Total Agatston Score: 0  AORTA AND PULMONARY MEASUREMENTS: Aortic root (21 - 40 mm):  27 mm at the annulus  36 mm at the sinuses of Valsalva  27 mm at the sinotubular junction Ascending aorta ( < 40 mm): 33 mm Descending aorta ( < 40 mm): 23 mm Main pulmonary artery: ( < 30 mm): 29 mm  EXTRACARDIAC FINDINGS: 1.6 x 2.0 cm low attenuation lesion in segment 2 of the liver is favored to represent a small cyst. No consolidative airspace disease or pleural effusions in the visualized portions of the thorax. There are no aggressive appearing lytic or blastic lesions noted in the visualized portions of the skeleton.  IMPRESSION: 1. No evidence of significant coronary artery disease. Patient's coronary artery calcium score is zero. 2. 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 A short portion of the mid LAD extends into an intracavitary position within the right ventricle. This finding is benign, and typically of no clinical significance. One caveat is if the patient should ever require pacemaker lead placement within the right ventricle, the position of the marked intracavitary portion of the mid LAD should be noted. 3. No acute findings in the visualized thorax to account for the patient's symptoms. 4. Right coronary artery dominance.  ASSESSMENT AND PLAN: 43 year-old male with atypical chest pain and tachypalpitations. I have reviewed his previous cardiac studies and these are reassuring. The patient does have a family history of CAD. I think an exercise treadmill study is warranted to evaluate for exercise induced arrhythmia and any ischemic changes. The patient's cholesterol panel was reviewed and in my opinion does not warrant pharmacotherapy at this point. Favor ongoing efforts at weight loss and dietary modification.   Pending the results of his stress test, I will plan on seeing him back in one year for follow-up.   Sherren Mocha, MD 09/17/2014 11:37 PM

## 2014-09-17 NOTE — Patient Instructions (Signed)
Your physician has requested that you have an exercise tolerance test. For further information please visit [email or web address]. Please also follow instruction sheet, as given.   

## 2014-09-20 ENCOUNTER — Institutional Professional Consult (permissible substitution): Payer: BC Managed Care – PPO | Admitting: Cardiovascular Disease

## 2014-09-25 ENCOUNTER — Other Ambulatory Visit: Payer: Self-pay | Admitting: Physician Assistant

## 2014-09-25 ENCOUNTER — Other Ambulatory Visit: Payer: Self-pay | Admitting: Internal Medicine

## 2014-09-26 NOTE — Telephone Encounter (Signed)
Faxed

## 2014-10-27 ENCOUNTER — Other Ambulatory Visit: Payer: Self-pay | Admitting: Internal Medicine

## 2014-10-27 ENCOUNTER — Ambulatory Visit (INDEPENDENT_AMBULATORY_CARE_PROVIDER_SITE_OTHER): Payer: BC Managed Care – PPO | Admitting: Nurse Practitioner

## 2014-10-27 ENCOUNTER — Encounter: Payer: Self-pay | Admitting: Nurse Practitioner

## 2014-10-27 ENCOUNTER — Other Ambulatory Visit: Payer: Self-pay | Admitting: Physician Assistant

## 2014-10-27 ENCOUNTER — Other Ambulatory Visit: Payer: Self-pay | Admitting: Radiology

## 2014-10-27 DIAGNOSIS — R079 Chest pain, unspecified: Secondary | ICD-10-CM

## 2014-10-27 NOTE — Telephone Encounter (Signed)
Faxed librax

## 2014-10-27 NOTE — Telephone Encounter (Signed)
Dr Laney Pastor, you have seen pt recently but don't see this med discussed. Can we RF?

## 2014-10-27 NOTE — Progress Notes (Signed)
Exercise Treadmill Test  Pre-Exercise Testing Evaluation Rhythm: normal sinus  Rate: 75     Test  Exercise Tolerance Test Ordering MD: Sherren Mocha, MD  Interpreting MD: Truitt Merle, NP  Unique Test No: 1  Treadmill:  1  Indication for ETT: chest pain - rule out ischemia  Contraindication to ETT: No   Stress Modality: exercise - treadmill  Cardiac Imaging Performed: non   Protocol: standard Bruce - maximal  Max BP:  201/95  Max MPHR (bpm):  175  85% MPR (bpm):  149  MPHR obtained (bpm):  164 % MPHR obtained:  94%  Reached 85% MPHR (min:sec):  9:47 Total Exercise Time (min-sec):  12 minutes  Workload in METS:  13.7 Borg Scale: 17  Reason ETT Terminated:  patient's desire to stop    ST Segment Analysis At Rest: normal ST segments - no evidence of significant ST depression With Exercise: no evidence of significant ST depression  Other Information Arrhythmia:  No Angina during ETT:  absent (0) Quality of ETT:  diagnostic  ETT Interpretation:  normal - no evidence of ischemia by ST analysis  Comments: Patient presents today for routine GXT. Has +FH for CAD. Has had history of HTN - previously on medicine but lost weight. BP recently tracking back up. BP is elevated at beginning of today's study. No active chest pain. Considering dietary aids to lose weight. Today the patient exercised on the standard Bruce protocol for a total of 12 minutes.  Good exercise tolerance.  Adequate blood pressure response but patient is hypertensive.  Clinically negative for chest pain. Test was stopped due to patient's desire to stop/target HR achieved.  EKG negative for ischemia. No significant arrhythmia noted.   Recommendations: CV risk factor modification  Monitor BP at home. May need to restart meds - has been on Lisinopril HCT in the past.  He will see if he has had lipids checked by Dr Buddy Duty - would aggressively treat if elevated.  See back as planned.   Patient is agreeable to this  plan and will call if any problems develop in the interim.   Burtis Junes, RN, [place] [address] [place], [place]  [id number] [phone]

## 2014-10-31 ENCOUNTER — Other Ambulatory Visit: Payer: Self-pay | Admitting: Internal Medicine

## 2014-11-02 NOTE — Telephone Encounter (Signed)
Faxed

## 2014-11-28 ENCOUNTER — Other Ambulatory Visit: Payer: Self-pay | Admitting: Internal Medicine

## 2014-11-29 ENCOUNTER — Other Ambulatory Visit: Payer: Self-pay

## 2014-11-29 MED ORDER — ZOLPIDEM TARTRATE 10 MG PO TABS: ORAL_TABLET | ORAL | Status: DC

## 2014-11-29 NOTE — Telephone Encounter (Signed)
Pharm reqs RF of zolpidem. pended

## 2014-11-29 NOTE — Telephone Encounter (Signed)
Called in Ambien to pharmacy.

## 2014-11-29 NOTE — Telephone Encounter (Signed)
Called in Rx for Alprazolam to pt's pharmacy.

## 2014-12-02 ENCOUNTER — Encounter: Payer: Self-pay | Admitting: Internal Medicine

## 2015-01-10 ENCOUNTER — Ambulatory Visit (INDEPENDENT_AMBULATORY_CARE_PROVIDER_SITE_OTHER): Payer: 59 | Admitting: Internal Medicine

## 2015-01-10 VITALS — BP 140/100 | HR 86 | Temp 98.3°F | Resp 18 | Wt 242.0 lb

## 2015-01-10 DIAGNOSIS — G4700 Insomnia, unspecified: Secondary | ICD-10-CM | POA: Diagnosis not present

## 2015-01-10 DIAGNOSIS — F411 Generalized anxiety disorder: Secondary | ICD-10-CM

## 2015-01-10 DIAGNOSIS — K2100 Gastro-esophageal reflux disease with esophagitis, without bleeding: Secondary | ICD-10-CM

## 2015-01-10 DIAGNOSIS — I10 Essential (primary) hypertension: Secondary | ICD-10-CM

## 2015-01-10 DIAGNOSIS — E230 Hypopituitarism: Secondary | ICD-10-CM

## 2015-01-10 DIAGNOSIS — E291 Testicular hypofunction: Secondary | ICD-10-CM | POA: Diagnosis not present

## 2015-01-10 DIAGNOSIS — G43709 Chronic migraine without aura, not intractable, without status migrainosus: Secondary | ICD-10-CM | POA: Diagnosis not present

## 2015-01-10 DIAGNOSIS — Z6833 Body mass index (BMI) 33.0-33.9, adult: Secondary | ICD-10-CM

## 2015-01-10 DIAGNOSIS — J309 Allergic rhinitis, unspecified: Secondary | ICD-10-CM | POA: Diagnosis not present

## 2015-01-10 DIAGNOSIS — N529 Male erectile dysfunction, unspecified: Secondary | ICD-10-CM | POA: Diagnosis not present

## 2015-01-10 MED ORDER — HYDROCHLOROTHIAZIDE 12.5 MG PO CAPS: 12.5000 mg | ORAL_CAPSULE | Freq: Every day | ORAL | Status: DC

## 2015-01-10 MED ORDER — OMEPRAZOLE-SODIUM BICARBONATE 40-1100 MG PO CAPS: 1.0000 | ORAL_CAPSULE | Freq: Every day | ORAL | Status: DC

## 2015-01-10 MED ORDER — MONTELUKAST SODIUM 10 MG PO TABS: 10.0000 mg | ORAL_TABLET | Freq: Every day | ORAL | Status: DC

## 2015-01-10 MED ORDER — IPRATROPIUM BROMIDE HFA 17 MCG/ACT IN AERS: INHALATION_SPRAY | RESPIRATORY_TRACT | Status: DC

## 2015-01-10 MED ORDER — SILDENAFIL CITRATE 100 MG PO TABS: ORAL_TABLET | ORAL | Status: DC

## 2015-01-10 MED ORDER — FEXOFENADINE-PSEUDOEPHED ER 180-240 MG PO TB24: 1.0000 | ORAL_TABLET | Freq: Every day | ORAL | Status: DC

## 2015-01-10 MED ORDER — RIZATRIPTAN BENZOATE 10 MG PO TBDP: ORAL_TABLET | ORAL | Status: DC

## 2015-01-10 MED ORDER — ESOMEPRAZOLE MAGNESIUM 40 MG PO CPDR: 40.0000 mg | DELAYED_RELEASE_CAPSULE | Freq: Every day | ORAL | Status: DC

## 2015-01-10 MED ORDER — HYDROCORTISONE-ACETIC ACID 1-2 % OT SOLN: 3.0000 [drp] | Freq: Three times a day (TID) | OTIC | Status: DC

## 2015-01-10 MED ORDER — FLUTICASONE PROPIONATE 50 MCG/ACT NA SUSP: NASAL | Status: DC

## 2015-01-10 MED ORDER — IPRATROPIUM BROMIDE 0.06 % NA SOLN: 2.0000 | Freq: Two times a day (BID) | NASAL | Status: DC

## 2015-01-10 MED ORDER — ZOLPIDEM TARTRATE 10 MG PO TABS: ORAL_TABLET | ORAL | Status: DC

## 2015-01-10 MED ORDER — KETOPROFEN 75 MG PO CAPS: 75.0000 mg | ORAL_CAPSULE | Freq: Four times a day (QID) | ORAL | Status: DC | PRN

## 2015-01-10 MED ORDER — ALBUTEROL SULFATE HFA 108 (90 BASE) MCG/ACT IN AERS: 2.0000 | INHALATION_SPRAY | Freq: Four times a day (QID) | RESPIRATORY_TRACT | Status: DC | PRN

## 2015-01-10 MED ORDER — CILIDINIUM-CHLORDIAZEPOXIDE 2.5-5 MG PO CAPS: 1.0000 | ORAL_CAPSULE | Freq: Two times a day (BID) | ORAL | Status: DC

## 2015-01-10 MED ORDER — ALPRAZOLAM 1 MG PO TABS: 1.0000 mg | ORAL_TABLET | Freq: Three times a day (TID) | ORAL | Status: DC | PRN

## 2015-01-10 MED ORDER — FLUTICASONE-SALMETEROL 250-50 MCG/DOSE IN AEPB: 1.0000 | INHALATION_SPRAY | Freq: Two times a day (BID) | RESPIRATORY_TRACT | Status: DC

## 2015-01-10 NOTE — Progress Notes (Signed)
This chart was scribed for Randall Lin, MD by Einar Pheasant, ED Scribe. This patient was seen in room 3 and the patient's care was started at 9:33 PM.  Subjective:    Patient ID: Randall Reed, male    DOB: [birth year], 44 y.o.   MRN: [id number]  Chief Complaint  Patient presents with  . Medication Refill    xanax, maxalt, ambien    HPI  Randall Reed is a 44 y.o. male who presents to the office requesting medication refill on all his medications.  Pt is also complaining of hypertension. He states that for the past 2 weeks he has been noted high blood pressure readings. Pt notes his diastolic number to be in the range of 90-102. Current office measured BP at triage was 140/100. He denies any associated symptoms. He has a strong family history of heart disease and hypertension. Recent cardiac stress test considered normal although he was hypertensive at the time. Past history of being on anti hypertensives but then was able to discontinue medicine after significant weight loss through exercise.  No chest pain or palpitations. No headaches or visual changes. Continues to be followed by endocrinology with testosterone supplements but still has an inability to lose weight and is continuing to gain somewhat.   Patient Active Problem List   Diagnosis Date Noted  . 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 rotator cuff tear 10/17/2013  . Hypogonadism male--- on replacement  09/04/2013  . ED (erectile dysfunction)----? Not responding well to testosterone replacement  07/30/2013  . Palpitations 02/21/2012  . BMI 33.0-33.9,adult 02/04/2012  . Allergic rhinitis----ferry significant symptoms although now well controlled on Allegra-D plus nasal sprays and he is reluctant to discontinue the decongestant  02/04/2012  . GERD (gastroesophageal reflux disease)--- requires Nexium and Zegerid at bedtime for control  02/04/2012  . Migraine----Maxalt works very well although the insurance  come he fights and on this  02/04/2012  . GAD (generalized anxiety disorder)--- still needs intermittent Xanax  02/04/2012  . Insomnia--still greatly affected by anxiety  02/04/2012  . HYPERTENSION, BENIGN--- past history as noted  04/13/2009  . CHEST PAIN-UNSPECIFIED--- a wish in stable  04/13/2009   Past Medical History  Diagnosis Date  . GERD (gastroesophageal reflux disease)   . Anxiety   . Seasonal allergic reaction   . Depression   . Arthritis   . Asthma     Prior to Admission medications   Medication Sig Start Date End Date Taking? Authorizing Provider  acetic acid-hydrocortisone (VOSOL-HC) otic solution Place 3 drops into both ears 3 (three) times daily. Use as needed to prevent swimmer's ear 11/11/13  Yes Gay Filler Copland, MD  albuterol (PROVENTIL HFA;VENTOLIN HFA) 108 (90 BASE) MCG/ACT inhaler Inhale 2 puffs into the lungs every 6 (six) hours as needed. 08/29/14  Yes Leandrew Koyanagi, MD  ALLEGRA-D ALLERGY & CONGESTION 180-240 MG per 24 hr tablet TAKE 1 TABLET BY MOUTH DAILY 10/27/14  Yes Leandrew Koyanagi, MD  ALPRAZolam [name]) 1 MG tablet TAKE 1 TABLET BY MOUTH THREE TIMES DAILY AS NEEDED 11/29/14  Yes Leandrew Koyanagi, MD  CIALIS 20 MG tablet  07/26/14  Yes Historical Provider, MD  clidinium-chlordiazePOXIDE (LIBRAX) 5-2.5 MG per capsule TAKE ONE CAPSULE BY MOUTH TWICE DAILY 10/27/14  Yes Leandrew Koyanagi, MD  diclofenac (VOLTAREN) 75 MG EC tablet Take 1 tablet (75 mg total) by mouth 2 (two) times daily. 10/17/13  Yes Leandrew Koyanagi, MD  esomeprazole (NEXIUM) 40 MG capsule Take 1 capsule (  40 mg total) by mouth daily before breakfast. 11/04/13  Yes Leandrew Koyanagi, MD  fluticasone [facility]) 50 MCG/ACT nasal spray INSTILL 2 SPRAYS IN EACH NOSTRIL DAILY 10/27/14  Yes Leandrew Koyanagi, MD  Fluticasone-Salmeterol (ADVAIR DISKUS) 250-50 MCG/DOSE AEPB Inhale 1 puff into the lungs 2 (two) times daily. 09/17/14  Yes Leandrew Koyanagi, MD  hydrocortisone (ANUSOL-HC) 2.5  % rectal cream Place rectally 2 (two) times daily. 08/27/12  Yes Leandrew Koyanagi, MD  ipratropium (ATROVENT HFA) 17 MCG/ACT inhaler INHALE 2 PUFFS INTO THE LUNGS TWICE DAILY OR AS DIRECTED 10/17/13  Yes Leandrew Koyanagi, MD  ipratropium (ATROVENT) 0.06 % nasal spray Place 2 sprays into the nose 2 (two) times daily. 10/17/13  Yes Leandrew Koyanagi, MD  ketoprofen (ORUDIS) 75 MG capsule Take 1 capsule (75 mg total) by mouth 4 (four) times daily as needed. 06/22/14  Yes Leandrew Koyanagi, MD  montelukast (SINGULAIR) 10 MG tablet TAKE 1 TABLET BY MOUTH EVERY NIGHT AT BEDTIME 10/27/14  Yes Leandrew Koyanagi, MD  omeprazole-sodium bicarbonate (ZEGERID) 40-1100 MG per capsule TAKE 1 CAPSULE BY MOUTH EVERY NIGHT AT BEDTIME 10/27/14  Yes Leandrew Koyanagi, MD  rizatriptan (MAXALT-MLT) 10 MG disintegrating tablet TAKE AS DIRECTED 11/29/14  Yes Leandrew Koyanagi, MD  sildenafil (VIAGRA) 100 MG tablet Take 1/2 to 1 tablet by mouth up to 12 hr before needed 12/17/13  Yes Leandrew Koyanagi, MD  zolpidem (AMBIEN) 10 MG tablet TAKE 1 TABLET BY MOUTH EVERY DAY AT BEDTIME AS NEEDED FOR SLEEP 11/29/14  Yes Leandrew Koyanagi, MD    Review of Systems  Constitutional: Negative for fever and chills.  HENT: Negative for congestion.   Respiratory: Negative for cough and shortness of breath.   Cardiovascular: Negative for chest pain.  Gastrointestinal: Negative for nausea, vomiting and abdominal pain.  Skin: Negative for rash.  Neurological: Negative for dizziness, weakness, numbness and headaches.   Objective:   Physical Exam  Constitutional: He is oriented to person, place, and time. He appears well-developed and well-nourished. No distress.  HENT:  Head: Normocephalic and atraumatic.  Right Ear: External ear normal.  Left Ear: External ear normal.  Nose: Nose normal.  Mouth/Throat: Oropharynx is clear and moist.  Eyes: Conjunctivae and EOM are normal.  Neck: Neck supple. No thyromegaly present.    Cardiovascular: Normal rate, regular rhythm and normal heart sounds.   No murmur heard. Pulmonary/Chest: Effort normal. No respiratory distress.  Musculoskeletal: Normal range of motion.  No peripheral edema  Lymphadenopathy:    He has no cervical adenopathy.  Neurological: He is alert and oriented to person, place, and time.  Skin: Skin is warm and dry.  Psychiatric: He has a normal mood and affect. His behavior is normal.  Nursing note and vitals reviewed.  BP 140/100 mmHg  Pulse 86  Temp(Src) 98.3 F (36.8 C) (Oral)  Resp 18  Wt 242 lb (109.77 kg)  SpO2 97%  Assessment & Plan:  Chronic migraine without aura without status migrainosus, not intractable - Plan: ketoprofen (ORUDIS) 75 MG capsule  Allergic rhinitis, unspecified allergic rhinitis type with reactive airway disease - Plan: ipratropium (ATROVENT) 0.06 % nasal spray, ipratropium (ATROVENT HFA) 17 MCG/ACT inhaler  HYPERTENSION, BENIGN--- restart medication/HCTZ 12.5 mg/daily blood pressures and follow-up after 3 to 4 weeks  BMI 33.0-33.9,adult--recent inability to lose and in fact regaining  Gastroesophageal reflux disease with esophagitis  GAD (generalized anxiety disorder)  Insomnia  Erectile dysfunction, unspecified erectile dysfunction type  Hypogonadotropic hypogonadism  in male  Meds ordered this encounter  Medications  . zolpidem (AMBIEN) 10 MG tablet    Sig: TAKE 1 TABLET BY MOUTH EVERY DAY AT BEDTIME AS NEEDED FOR SLEEP    Dispense:  30 tablet    Refill:  5  . sildenafil (VIAGRA) 100 MG tablet    Sig: Take 1/2 to 1 tablet by mouth up to 12 hr before needed    Dispense:  5 tablet    Refill:  11  . rizatriptan (MAXALT-MLT) 10 MG disintegrating tablet    Sig: TAKE AS DIRECTED    Dispense:  9 tablet    Refill:  11  . omeprazole-sodium bicarbonate (ZEGERID) 40-1100 MG per capsule    Sig: Take 1 capsule by mouth at bedtime.    Dispense:  30 capsule    Refill:  11  . montelukast (SINGULAIR) 10 MG  tablet    Sig: Take 1 tablet (10 mg total) by mouth at bedtime.    Dispense:  30 tablet    Refill:  11  . ketoprofen (ORUDIS) 75 MG capsule    Sig: Take 1 capsule (75 mg total) by mouth 4 (four) times daily as needed.    Dispense:  30 capsule    Refill:  5  . ipratropium (ATROVENT) 0.06 % nasal spray    Sig: Place 2 sprays into the nose 2 (two) times daily.    Dispense:  15 mL    Refill:  9  . ipratropium (ATROVENT HFA) 17 MCG/ACT inhaler    Sig: INHALE 2 PUFFS INTO THE LUNGS TWICE DAILY OR AS DIRECTED    Dispense:  12.9 g    Refill:  3  . Fluticasone-Salmeterol (ADVAIR DISKUS) 250-50 MCG/DOSE AEPB    Sig: Inhale 1 puff into the lungs 2 (two) times daily.    Dispense:  1 each    Refill:  3  . fluticasone (FLONASE) 50 MCG/ACT nasal spray    Sig: INSTILL 2 SPRAYS IN EACH NOSTRIL DAILY    Dispense:  16 g    Refill:  10  . esomeprazole (NEXIUM) 40 MG capsule    Sig: Take 1 capsule (40 mg total) by mouth daily before breakfast.    Dispense:  30 capsule    Refill:  11  . clidinium-chlordiazePOXIDE (LIBRAX) 5-2.5 MG per capsule    Sig: Take 1 capsule by mouth 2 (two) times daily.    Dispense:  60 capsule    Refill:  4  . ALPRAZolam (XANAX) 1 MG tablet    Sig: Take 1 tablet (1 mg total) by mouth 3 (three) times daily as needed.    Dispense:  90 tablet    Refill:  5  . fexofenadine-pseudoephedrine (ALLEGRA-D ALLERGY & CONGESTION) 180-240 MG per 24 hr tablet    Sig: Take 1 tablet by mouth daily.    Dispense:  30 tablet    Refill:  11  . albuterol (PROVENTIL HFA;VENTOLIN HFA) 108 (90 BASE) MCG/ACT inhaler    Sig: Inhale 2 puffs into the lungs every 6 (six) hours as needed.    Dispense:  1 Inhaler    Refill:  5  . acetic acid-hydrocortisone (VOSOL-HC) otic solution    Sig: Place 3 drops into both ears 3 (three) times daily. Use as needed to prevent swimmer's ear    Dispense:  10 mL    Refill:  5  . hydrochlorothiazide (MICROZIDE) 12.5 MG capsule    Sig: Take 1 capsule (12.5 mg  total) by mouth daily.  Dispense:  90 capsule    Refill:  3     I have completed the patient encounter in its entirety as documented by the scribe, with editing by me where necessary. Jad Johansson P. Laney Pastor, M.D.

## 2015-01-27 ENCOUNTER — Encounter: Payer: Self-pay | Admitting: Internal Medicine

## 2015-01-27 DIAGNOSIS — I10 Essential (primary) hypertension: Secondary | ICD-10-CM

## 2015-02-07 ENCOUNTER — Telehealth: Payer: Self-pay

## 2015-02-07 MED ORDER — OMEPRAZOLE 40 MG PO CPDR: 40.0000 mg | DELAYED_RELEASE_CAPSULE | Freq: Every day | ORAL | Status: DC

## 2015-02-07 NOTE — Telephone Encounter (Signed)
lmom of message.

## 2015-02-07 NOTE — Telephone Encounter (Signed)
Sent in omeprazole 40 mg to be taken once daily. Sent in 30 pills with 3 refills. He needs to come back to be seen for refills as this is a high dose for long term gerd management.

## 2015-02-07 NOTE — Telephone Encounter (Signed)
PA denied for Esomeprazole 40 mg denied. Please send in alternative.

## 2015-02-20 ENCOUNTER — Ambulatory Visit (INDEPENDENT_AMBULATORY_CARE_PROVIDER_SITE_OTHER): Payer: 59 | Admitting: Internal Medicine

## 2015-02-20 VITALS — BP 136/88 | HR 92 | Temp 98.2°F | Resp 18 | Ht 71.0 in | Wt 244.8 lb

## 2015-02-20 DIAGNOSIS — M545 Low back pain, unspecified: Secondary | ICD-10-CM

## 2015-02-20 DIAGNOSIS — K2100 Gastro-esophageal reflux disease with esophagitis, without bleeding: Secondary | ICD-10-CM

## 2015-02-20 DIAGNOSIS — M546 Pain in thoracic spine: Secondary | ICD-10-CM | POA: Diagnosis not present

## 2015-02-20 DIAGNOSIS — Z209 Contact with and (suspected) exposure to unspecified communicable disease: Secondary | ICD-10-CM | POA: Diagnosis not present

## 2015-02-20 DIAGNOSIS — M25552 Pain in left hip: Secondary | ICD-10-CM | POA: Diagnosis not present

## 2015-02-20 LAB — HIV ANTIBODY (ROUTINE TESTING W REFLEX): HIV: NONREACTIVE

## 2015-02-20 LAB — HEPATITIS C ANTIBODY: HCV Ab: NEGATIVE

## 2015-02-20 LAB — HEPATITIS B SURFACE ANTIGEN: Hepatitis B Surface Ag: NEGATIVE

## 2015-02-20 LAB — RPR

## 2015-02-20 MED ORDER — MELOXICAM 15 MG PO TABS
15.0000 mg | ORAL_TABLET | Freq: Every day | ORAL | Status: DC
Start: 2015-02-20 — End: 2015-11-18

## 2015-02-20 MED ORDER — CYCLOBENZAPRINE HCL 10 MG PO TABS: 10.0000 mg | ORAL_TABLET | Freq: Three times a day (TID) | ORAL | Status: DC | PRN

## 2015-02-20 NOTE — Progress Notes (Addendum)
Subjective:  This chart was scribed for [name] MD, by Tamsen Roers, at [facility].  This patient was seen in room 10 and the patient's care was started at 11:05 AM.   Chief Complaint  Patient presents with  . Back Pain    muscle spasms in back   . Hip Pain  . medication questions    nexium and maxalt not covered by insurance  . Immunizations    wants to make suer he is up to date on       Patient ID: Randall Reed, male    DOB: [birth year], 44 y.o.   MRN: [id number]  HPI  HPI Comments: Randall Reed is a 44 y.o. male who presents to [facility] for hip pain, back pain, and STI testing.  He also needs a pre approval for Nexium and Zegerid from insurance.   Back pain: He has intermittent general back pain (mostly right side) onset 1.5 weeks ago when he sits or stands for a long period of time.  He has had heat stone therapy to alleviate his symptoms but denies any relief. He is unable to wash below his knees and has difficulty/increased pain when getting out of his vehicle. He has not yet tried any muscle relaxers.   Hip pain: His hip pain started yesterday but thinks it may be due to overcompensating for his back. He has had injections for his hip in the past.   STI testing: He would like testing for STI's today.     Patient Active Problem List   Diagnosis Date Noted  . 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 rotator cuff tear 10/17/2013  . Hypogonadism male 09/04/2013  . ED (erectile dysfunction) 07/30/2013  . Palpitations 02/21/2012  . BMI 33.0-33.9,adult 02/04/2012  . Allergic rhinitis 02/04/2012  . GERD (gastroesophageal reflux disease) 02/04/2012  . Migraine 02/04/2012  . GAD (generalized anxiety disorder) 02/04/2012  . Insomnia 02/04/2012  . HYPERTENSION, BENIGN 04/13/2009  . CHEST PAIN-UNSPECIFIED 04/13/2009    Current Outpatient Prescriptions on File Prior to Visit  Medication Sig Dispense  Refill  . acetic acid-hydrocortisone (VOSOL-HC) otic solution Place 3 drops into both ears 3 (three) times daily. Use as needed to prevent swimmer's ear 10 mL 5  . albuterol (PROVENTIL HFA;VENTOLIN HFA) 108 (90 BASE) MCG/ACT inhaler Inhale 2 puffs into the lungs every 6 (six) hours as needed. 1 Inhaler 5  . ALPRAZolam (XANAX) 1 MG tablet Take 1 tablet (1 mg total) by mouth 3 (three) times daily as needed. 90 tablet 5  . CIALIS 20 MG tablet   1  . clidinium-chlordiazePOXIDE (LIBRAX) 5-2.5 MG per capsule Take 1 capsule by mouth 2 (two) times daily. 60 capsule 4  . diclofenac (VOLTAREN) 75 MG EC tablet Take 1 tablet (75 mg total) by mouth 2 (two) times daily. 60 tablet 5  . esomeprazole (NEXIUM) 40 MG capsule Take 1 capsule (40 mg total) by mouth daily before breakfast. 30 capsule 11  . fexofenadine-pseudoephedrine (ALLEGRA-D ALLERGY & CONGESTION) 180-240 MG per 24 hr tablet Take 1 tablet by mouth daily. 30 tablet 11  . fluticasone (FLONASE) 50 MCG/ACT nasal spray INSTILL 2 SPRAYS IN EACH NOSTRIL DAILY 16 g 10  . Fluticasone-Salmeterol (ADVAIR DISKUS) 250-50 MCG/DOSE AEPB Inhale 1 puff into the lungs 2 (two) times daily. 1 each 3  . hydrochlorothiazide (MICROZIDE) 12.5 MG capsule Take 1 capsule (12.5 mg total) by mouth daily. 90 capsule 3  . hydrocortisone (ANUSOL-HC) 2.5 %  rectal cream Place rectally 2 (two) times daily. 30 g 3  . ipratropium (ATROVENT HFA) 17 MCG/ACT inhaler INHALE 2 PUFFS INTO THE LUNGS TWICE DAILY OR AS DIRECTED 12.9 g 3  . ipratropium (ATROVENT) 0.06 % nasal spray Place 2 sprays into the nose 2 (two) times daily. 15 mL 9  . ketoprofen (ORUDIS) 75 MG capsule Take 1 capsule (75 mg total) by mouth 4 (four) times daily as needed. 30 capsule 5  . montelukast (SINGULAIR) 10 MG tablet Take 1 tablet (10 mg total) by mouth at bedtime. 30 tablet 11  . rizatriptan (MAXALT-MLT) 10 MG disintegrating tablet TAKE AS DIRECTED 9 tablet 11  . sildenafil (VIAGRA) 100 MG tablet Take 1/2 to 1 tablet  by mouth up to 12 hr before needed 5 tablet 11  . zolpidem (AMBIEN) 10 MG tablet TAKE 1 TABLET BY MOUTH EVERY DAY AT BEDTIME AS NEEDED FOR SLEEP 30 tablet 5  . omeprazole (PRILOSEC) 40 MG capsule Take 1 capsule (40 mg total) by mouth daily. (Patient not taking: Reported on 02/20/2015) 30 capsule 3  . omeprazole-sodium bicarbonate (ZEGERID) 40-1100 MG per capsule Take 1 capsule by mouth at bedtime. (Patient not taking: Reported on 02/20/2015) 30 capsule 11   No current facility-administered medications on file prior to visit.   Allergies  Allergen Reactions  . Desloratadine Other (See Comments)    CLARINEX-"severe headache"  . Hydrocod Polst-Cpm Polst Er Itching  . Loratadine Other (See Comments)    "severe headache"  . Telithromycin   . Tussionex Pennkinetic Er [Hydrocod Polst-Cpm Polst Er] Itching  . Levbid [Hyoscyamine Sulfate] Rash    Review of Systems  Constitutional: Negative for fever, chills, diaphoresis and fatigue.  HENT: Negative for congestion, drooling, rhinorrhea and sore throat.   Eyes: Negative for pain, discharge, redness and itching.  Respiratory: Negative for cough, choking and shortness of breath.   Musculoskeletal: Positive for back pain.  Skin: Negative for color change.  Neurological: Negative for syncope.       Objective:   Physical Exam  Constitutional: He is oriented to person, place, and time. He appears well-developed and well-nourished. No distress.  HENT:  Head: Normocephalic and atraumatic.  Eyes: Conjunctivae and EOM are normal. Pupils are equal, round, and reactive to light.  Neck: Neck supple.  Cardiovascular: Normal rate.   Pulmonary/Chest: Effort normal. No respiratory distress.  Musculoskeletal: Normal range of motion.  He is tender over the spinous process from T10 to L4 and on the para lumbar muscles on the left but straight leg raise is negative at 90 degrees bilaterally and reflexes are symmetrical.   Neurological: He is alert and  oriented to person, place, and time.  Skin: Skin is warm and dry.  Psychiatric: He has a normal mood and affect. His behavior is normal.  Nursing note and vitals reviewed.   BP 136/88 mmHg  Pulse 92  Temp(Src) 98.2 F (36.8 C) (Oral)  Resp 18  Ht 5\' 11"  (1.803 m)  Wt 244 lb 12.8 oz (111.041 kg)  BMI 34.16 kg/m2  SpO2 98%    Assessment & Plan:  I have completed the patient encounter in its entirety as documented by the scribe, with editing by me where necessary. Annet Manukyan P. Laney Pastor, M.D. Exposure to communicable disease - Plan: GC/Chlamydia Probe Amp, HIV antibody, Hepatitis C antibody, Hepatitis B surface antigen, RPR  Midline thoracic back pain - Plan: Ambulatory referral to Physical Therapy  Left-sided low back pain without sciatica - Plan: Ambulatory referral to Physical Therapy  Hip pain, left - Plan: Ambulatory referral to Physical Therapy  Gastroesophageal reflux disease with esophagitis   Meds ordered this encounter  Medications  . meloxicam (MOBIC) 15 MG tablet    Sig: Take 1 tablet (15 mg total) by mouth daily.    Dispense:  30 tablet    Refill:  0  . cyclobenzaprine (FLEXERIL) 10 MG tablet    Sig: Take 1 tablet (10 mg total) by mouth 3 (three) times daily as needed for muscle spasms. May choose to use only at bedtime due to sleepiness.    Dispense:  30 tablet    Refill:  0  Continue to try to receive approval for using combination therapy to control his GERD

## 2015-02-22 LAB — GC/CHLAMYDIA PROBE AMP
CT Probe RNA: NEGATIVE
GC PROBE AMP APTIMA: NEGATIVE

## 2015-03-05 ENCOUNTER — Ambulatory Visit (INDEPENDENT_AMBULATORY_CARE_PROVIDER_SITE_OTHER): Payer: 59 | Admitting: Family Medicine

## 2015-03-05 VITALS — BP 120/90 | HR 106 | Temp 97.8°F | Resp 16 | Ht 70.5 in | Wt 244.0 lb

## 2015-03-05 DIAGNOSIS — J309 Allergic rhinitis, unspecified: Secondary | ICD-10-CM | POA: Diagnosis not present

## 2015-03-05 DIAGNOSIS — J0101 Acute recurrent maxillary sinusitis: Secondary | ICD-10-CM | POA: Diagnosis not present

## 2015-03-05 DIAGNOSIS — H6591 Unspecified nonsuppurative otitis media, right ear: Secondary | ICD-10-CM | POA: Diagnosis not present

## 2015-03-05 DIAGNOSIS — M62830 Muscle spasm of back: Secondary | ICD-10-CM | POA: Diagnosis not present

## 2015-03-05 MED ORDER — CYCLOBENZAPRINE HCL 10 MG PO TABS: 10.0000 mg | ORAL_TABLET | Freq: Three times a day (TID) | ORAL | Status: DC | PRN

## 2015-03-05 MED ORDER — AMOXICILLIN-POT CLAVULANATE 875-125 MG PO TABS: 1.0000 | ORAL_TABLET | Freq: Two times a day (BID) | ORAL | Status: DC

## 2015-03-05 NOTE — Progress Notes (Signed)
Subjective:    Patient ID: Randall Reed, male    DOB: 22-Nov-1970, 44 y.o.   MRN: [id number] This chart was scribed for [name], MD by Marti Sleigh, Medical Scribe. This patient was seen in Room 14 and the patient's care was started a 10:27 AM.  Chief Complaint  Patient presents with  . Cough  . Sore Throat  . Nasal Congestion  . Otalgia  . Back Spasm    Seen Dr. Laney Pastor on 02/20/2015-Still having problems    HPI HPI Comments: Randall Reed is a 44 y.o. male who presents to [facility] complaining of back pain and cough.  Cough, nasal congestion, ear pain, sore throat: Pt states he has had a cough for the last week. Pt endorses associated sore throat, right ear pain, and nasal congestion with yellow purulent discharge for the last week. Pt has taken sudafed and tessalon pearls without relief. Pt denies associated fever or chills. Pt states he has been having a sinus infection every other month for the last year. Pt states he is taking allegra, Singulair, Flonase, and Atrovent without relief. Pt states he does two sprays flonase every day, and one spray of Atrovent per day. Pt states he was on abx two months ago for a sinus infection.  Back spasms: Pt was seen on 02/20/2015 by Dr. Laney Pastor for a back spasm, and was prescribed flexeil and mobic, and was referred to physical therapy. Pt states he has been taking flexeril 2-3 times per day, mobic once per day. Pt states his pain is improving, but not resolved. Pt states he is now having some pain has in his right hip. Pt denies saddle anesthesia, pain radiating down leg, loss of bowel or bladder control. Pt has not reported to physical therapy because he has not received a call to schedule the appointment. Pt states he has had three massages since his last visit without relief.    Patient Active Problem List   Diagnosis Date Noted  . 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 rotator cuff tear 10/17/2013  . Hypogonadism male  09/04/2013  . ED (erectile dysfunction) 07/30/2013  . Palpitations 02/21/2012  . BMI 33.0-33.9,adult 02/04/2012  . Allergic rhinitis 02/04/2012  . GERD (gastroesophageal reflux disease) 02/04/2012  . Migraine 02/04/2012  . GAD (generalized anxiety disorder) 02/04/2012  . Insomnia 02/04/2012  . HYPERTENSION, BENIGN 04/13/2009  . CHEST PAIN-UNSPECIFIED 04/13/2009   Past Medical History  Diagnosis Date  . GERD (gastroesophageal reflux disease)   . Anxiety   . Seasonal allergic reaction   . Depression   . Arthritis   . Asthma    Past Surgical History  Procedure Laterality Date  . None    . None     Allergies  Allergen Reactions  . Desloratadine Other (See Comments)    CLARINEX-"severe headache"  . Hydrocod Polst-Cpm Polst Er Itching  . Loratadine Other (See Comments)    "severe headache"  . Telithromycin   . Tussionex Pennkinetic Er [Hydrocod Polst-Cpm Polst Er] Itching  . Levbid [Hyoscyamine Sulfate] Rash   Prior to Admission medications   Medication Sig Start Date End Date Taking? Authorizing Provider  acetic acid-hydrocortisone (VOSOL-HC) otic solution Place 3 drops into both ears 3 (three) times daily. Use as needed to prevent swimmer's ear 01/10/15  Yes Leandrew Koyanagi, MD  albuterol (PROVENTIL HFA;VENTOLIN HFA) 108 (90 BASE) MCG/ACT inhaler Inhale 2 puffs into the lungs every 6 (six) hours as needed. 01/10/15  Yes Leandrew Koyanagi, MD  ALPRAZolam [name])  1 MG tablet Take 1 tablet (1 mg total) by mouth 3 (three) times daily as needed. 01/10/15  Yes Leandrew Koyanagi, MD  clidinium-chlordiazePOXIDE (LIBRAX) 5-2.5 MG per capsule Take 1 capsule by mouth 2 (two) times daily. 01/10/15  Yes Leandrew Koyanagi, MD  cyclobenzaprine (FLEXERIL) 10 MG tablet Take 1 tablet (10 mg total) by mouth 3 (three) times daily as needed for muscle spasms. May choose to use only at bedtime due to sleepiness. 02/20/15  Yes Leandrew Koyanagi, MD  diclofenac (VOLTAREN) 75 MG EC tablet Take 1 tablet  (75 mg total) by mouth 2 (two) times daily. 10/17/13  Yes Leandrew Koyanagi, MD  esomeprazole (NEXIUM) 40 MG capsule Take 1 capsule (40 mg total) by mouth daily before breakfast. 01/10/15  Yes Leandrew Koyanagi, MD  fexofenadine-pseudoephedrine (ALLEGRA-D ALLERGY & CONGESTION) 180-240 MG per 24 hr tablet Take 1 tablet by mouth daily. 01/10/15  Yes Leandrew Koyanagi, MD  fluticasone [facility]) 50 MCG/ACT nasal spray INSTILL 2 SPRAYS IN EACH NOSTRIL DAILY 01/10/15  Yes Leandrew Koyanagi, MD  Fluticasone-Salmeterol (ADVAIR DISKUS) 250-50 MCG/DOSE AEPB Inhale 1 puff into the lungs 2 (two) times daily. 01/10/15  Yes Leandrew Koyanagi, MD  hydrochlorothiazide (MICROZIDE) 12.5 MG capsule Take 1 capsule (12.5 mg total) by mouth daily. 01/10/15  Yes Leandrew Koyanagi, MD  hydrocortisone (ANUSOL-HC) 2.5 % rectal cream Place rectally 2 (two) times daily. 08/27/12  Yes Leandrew Koyanagi, MD  ipratropium (ATROVENT HFA) 17 MCG/ACT inhaler INHALE 2 PUFFS INTO THE LUNGS TWICE DAILY OR AS DIRECTED 01/10/15  Yes Leandrew Koyanagi, MD  ipratropium (ATROVENT) 0.06 % nasal spray Place 2 sprays into the nose 2 (two) times daily. 01/10/15  Yes Leandrew Koyanagi, MD  ketoprofen (ORUDIS) 75 MG capsule Take 1 capsule (75 mg total) by mouth 4 (four) times daily as needed. 01/10/15  Yes Leandrew Koyanagi, MD  Lorcaserin HCl 10 MG TABS Take 1 tablet by mouth daily.   Yes Historical Provider, MD  meloxicam (MOBIC) 15 MG tablet Take 1 tablet (15 mg total) by mouth daily. 02/20/15  Yes Leandrew Koyanagi, MD  montelukast (SINGULAIR) 10 MG tablet Take 1 tablet (10 mg total) by mouth at bedtime. 01/10/15  Yes Leandrew Koyanagi, MD  rizatriptan (MAXALT-MLT) 10 MG disintegrating tablet TAKE AS DIRECTED 01/10/15  Yes Leandrew Koyanagi, MD  sildenafil (VIAGRA) 100 MG tablet Take 1/2 to 1 tablet by mouth up to 12 hr before needed 01/10/15  Yes Leandrew Koyanagi, MD  zolpidem (AMBIEN) 10 MG tablet TAKE 1 TABLET BY MOUTH EVERY DAY AT BEDTIME AS NEEDED  FOR SLEEP 01/10/15  Yes Leandrew Koyanagi, MD  omeprazole (PRILOSEC) 40 MG capsule Take 1 capsule (40 mg total) by mouth daily. Patient not taking: Reported on 02/20/2015 02/07/15   Merlinda Frederick McVeigh, PA  omeprazole-sodium bicarbonate (ZEGERID) 40-1100 MG per capsule Take 1 capsule by mouth at bedtime. Patient not taking: Reported on 02/20/2015 01/10/15   Leandrew Koyanagi, MD   History   Social History  . Marital Status: Single    Spouse Name: N/A  . Number of Children: 0  . Years of Education: N/A   Occupational History  . [employer])    Social History Main Topics  . Smoking status: Never Smoker   . Smokeless tobacco: Never Used  . Alcohol Use: 1.8 oz/week    3 Standard drinks or equivalent per week  . Drug Use: No  . Sexual  Activity: Yes   Other Topics Concern  . Not on file   Social History Narrative    Review of Systems  Constitutional: Negative for fever and chills.  HENT: Positive for congestion, ear pain, rhinorrhea and sore throat.   Respiratory: Positive for cough.   Musculoskeletal: Positive for back pain. Negative for arthralgias.       Objective:   Physical Exam  Constitutional: He is oriented to person, place, and time. He appears well-developed and well-nourished.  HENT:  Head: Normocephalic and atraumatic.  Right Ear: Tympanic membrane, external ear and ear canal normal.  Left Ear: Tympanic membrane, external ear and ear canal normal.  Nose: No rhinorrhea.  Mouth/Throat: Oropharynx is clear and moist and mucous membranes are normal. No oropharyngeal exudate or posterior oropharyngeal erythema.  Clear fluid behind right TM, but no erythema. No erythema in throat. Bilateral maxillary sinus tenderness, as well as left frontal sinus tenderness. Yellow nasal discharge right lgreater than left. Swollen turbinates bilaterally.   Eyes: Conjunctivae are normal. Pupils are equal, round, and reactive to light.  Neck: Neck supple.    Cardiovascular: Normal rate, regular rhythm, normal heart sounds and intact distal pulses.   No murmur heard. Pulmonary/Chest: Effort normal and breath sounds normal. He has no wheezes. He has no rhonchi. He has no rales.  Abdominal: Soft. There is no tenderness.  Musculoskeletal:  Paraspinal muscles of mid to lower back are the location of his discomfort, but are non tender on exam. No bony tenderness, no midline tenderness. Full ROM of lumbar and thoracic.  Lymphadenopathy:    He has no cervical adenopathy.  Neurological: He is alert and oriented to person, place, and time.  2+ equal patella and achilles reflexes. Negative straight leg raise bilaterally.   Skin: Skin is warm and dry. No rash noted.  Psychiatric: He has a normal mood and affect. His behavior is normal.  Vitals reviewed.   Filed Vitals:   03/05/15 0905  BP: 120/90  Pulse: 106  Temp: 97.8 F (36.6 C)  TempSrc: Oral  Resp: 16  Height: 5' 10.5" (1.791 m)  Weight: 244 lb (110.678 kg)  SpO2: 97%       Assessment & Plan:   Randall Reed is a 44 y.o. male Spasm of back muscles - Plan: cyclobenzaprine (FLEXERIL) 10 MG tablet  - cont flexeril, back care manual for HEP, and PT eval pending - message sent to scheduling. rtc precautions.   Acute recurrent maxillary sinusitis - Plan: amoxicillin-clavulanate (AUGMENTIN) 875-125 MG per tablet, Allergic rhinitis, unspecified allergic rhinitis type, Right serous otitis media, recurrence not specified, unspecified chronicity  - probable initial URI with underlying allergies, but now possible secondary sinusitis.   -sx care - saline NS, increase atrovent ns to BID prn, Afrin prior to interview Tuesday - not to exceed 3 days, and if not improved sinus sx's in next few days - can start Augmentin - SED.  RTC precautions.   Meds ordered this encounter  Medications  . amoxicillin-clavulanate (AUGMENTIN) 875-125 MG per tablet    Sig: Take 1 tablet by mouth 2 (two) times daily.     Dispense:  20 tablet    Refill:  0  . cyclobenzaprine (FLEXERIL) 10 MG tablet    Sig: Take 1 tablet (10 mg total) by mouth 3 (three) times daily as needed for muscle spasms. May choose to use only at bedtime due to sleepiness.    Dispense:  30 tablet    Refill:  0   Patient  Instructions  Can increase atrovent nasal spray to twice per day if needed, afrin nasal spray Monday night and Tuesday only if needed. Saline nasal spray atleast 4 times per day, drink plenty of fluids. augmentin if not improving in next few days.   I sent message to schedulers about your physical therapy.  Continue flexeril if needed, see information and exercises in booklet.   Return to the clinic or go to the nearest emergency room if any of your symptoms worsen or new symptoms occur.  Serous Otitis Media Serous otitis media is fluid in the middle ear space. This space contains the bones for hearing and air. Air in the middle ear space helps to transmit sound.  The air gets there through the eustachian tube. This tube goes from the back of the nose (nasopharynx) to the middle ear space. It keeps the pressure in the middle ear the same as the outside world. It also helps to drain fluid from the middle ear space. CAUSES  Serous otitis media occurs when the eustachian tube gets blocked. Blockage can come from:  Ear infections.  Colds and other upper respiratory infections.  Allergies.  Irritants such as cigarette smoke.  Sudden changes in air pressure (such as descending in an airplane).  Enlarged adenoids.  A mass in the nasopharynx. During colds and upper respiratory infections, the middle ear space can become temporarily filled with fluid. This can happen after an ear infection also. Once the infection clears, the fluid will generally drain out of the ear through the eustachian tube. If it does not, then serous otitis media occurs. SIGNS AND SYMPTOMS   Hearing loss.  A feeling of fullness in the ear,  without pain.  Young children may not show any symptoms but may show slight behavioral changes, such as agitation, ear pulling, or crying. DIAGNOSIS  Serous otitis media is diagnosed by an ear exam. Tests may be done to check on the movement of the eardrum. Hearing exams may also be done. TREATMENT  The fluid most often goes away without treatment. If allergy is the cause, allergy treatment may be helpful. Fluid that persists for several months may require minor surgery. A small tube is placed in the eardrum to:  Drain the fluid.  Restore the air in the middle ear space. In certain situations, antibiotic medicines are used to avoid surgery. Surgery may be done to remove enlarged adenoids (if this is the cause). HOME CARE INSTRUCTIONS   Keep children away from tobacco smoke.  Keep all follow-up visits as directed by your health care provider. SEEK MEDICAL CARE IF:   Your hearing is not better in 3 months.  Your hearing is worse.  You have ear pain.  You have drainage from the ear.  You have dizziness.  You have serous otitis media only in one ear or have any bleeding from your nose (epistaxis).  You notice a lump on your neck. MAKE SURE YOU:  Understand these instructions.   Will watch your condition.   Will get help right away if you are not doing well or get worse.  Document Released: 01/12/2004 Document Revised: 03/08/2014 Document Reviewed: 05/19/2013 [facility] Patient Information 2015 [place], [place]. This information is not intended to replace advice given to you by your health care provider. Make sure you discuss any questions you have with your health care provider.   Sinusitis Sinusitis is redness, soreness, and inflammation of the paranasal sinuses. Paranasal sinuses are air pockets within the bones of your face (  beneath the eyes, the middle of the forehead, or above the eyes). In healthy paranasal sinuses, mucus is able to drain out, and air is able to  circulate through them by way of your nose. However, when your paranasal sinuses are inflamed, mucus and air can become trapped. This can allow bacteria and other germs to grow and cause infection. Sinusitis can develop quickly and last only a short time (acute) or continue over a long period (chronic). Sinusitis that lasts for more than 12 weeks is considered chronic.  CAUSES  Causes of sinusitis include:  Allergies.  Structural abnormalities, such as displacement of the cartilage that separates your nostrils (deviated septum), which can decrease the air flow through your nose and sinuses and affect sinus drainage.  Functional abnormalities, such as when the small hairs (cilia) that line your sinuses and help remove mucus do not work properly or are not present. SIGNS AND SYMPTOMS  Symptoms of acute and chronic sinusitis are the same. The primary symptoms are pain and pressure around the affected sinuses. Other symptoms include:  Upper toothache.  Earache.  Headache.  Bad breath.  Decreased sense of smell and taste.  A cough, which worsens when you are lying flat.  Fatigue.  Fever.  Thick drainage from your nose, which often is green and may contain pus (purulent).  Swelling and warmth over the affected sinuses. DIAGNOSIS  Your health care provider will perform a physical exam. During the exam, your health care provider may:  Look in your nose for signs of abnormal growths in your nostrils (nasal polyps).  Tap over the affected sinus to check for signs of infection.  View the inside of your sinuses (endoscopy) using an imaging device that has a light attached (endoscope). If your health care provider suspects that you have chronic sinusitis, one or more of the following tests may be recommended:  Allergy tests.  Nasal culture. A sample of mucus is taken from your nose, sent to a lab, and screened for bacteria.  Nasal cytology. A sample of mucus is taken from your nose  and examined by your health care provider to determine if your sinusitis is related to an allergy. TREATMENT  Most cases of acute sinusitis are related to a viral infection and will resolve on their own within 10 days. Sometimes medicines are prescribed to help relieve symptoms (pain medicine, decongestants, nasal steroid sprays, or saline sprays).  However, for sinusitis related to a bacterial infection, your health care provider will prescribe antibiotic medicines. These are medicines that will help kill the bacteria causing the infection.  Rarely, sinusitis is caused by a fungal infection. In theses cases, your health care provider will prescribe antifungal medicine. For some cases of chronic sinusitis, surgery is needed. Generally, these are cases in which sinusitis recurs more than 3 times per year, despite other treatments. HOME CARE INSTRUCTIONS   Drink plenty of water. Water helps thin the mucus so your sinuses can drain more easily.  Use a humidifier.  Inhale steam 3 to 4 times a day (for example, sit in the bathroom with the shower running).  Apply a warm, moist washcloth to your face 3 to 4 times a day, or as directed by your health care provider.  Use saline nasal sprays to help moisten and clean your sinuses.  Take medicines only as directed by your health care provider.  If you were prescribed either an antibiotic or antifungal medicine, finish it all even if you start to feel better. SEEK  IMMEDIATE MEDICAL CARE IF:  You have increasing pain or severe headaches.  You have nausea, vomiting, or drowsiness.  You have swelling around your face.  You have vision problems.  You have a stiff neck.  You have difficulty breathing. MAKE SURE YOU:   Understand these instructions.  Will watch your condition.  Will get help right away if you are not doing well or get worse. Document Released: 10/22/2005 Document Revised: 03/08/2014 Document Reviewed: 11/06/2011 [facility]  Patient Information 2015 [place] [employer], [place]. This information is not intended to replace advice given to you by your health care provider. Make sure you discuss any questions you have with your health care provider.    Back Pain, Adult Low back pain is very common. About 1 in 5 people have back pain.The cause of low back pain is rarely dangerous. The pain often gets better over time.About half of people with a sudden onset of back pain feel better in just 2 weeks. About 8 in 10 people feel better by 6 weeks.  CAUSES Some common causes of back pain include:  Strain of the muscles or ligaments supporting the spine.  Wear and tear (degeneration) of the spinal discs.  Arthritis.  Direct injury to the back. DIAGNOSIS Most of the time, the direct cause of low back pain is not known.However, back pain can be treated effectively even when the exact cause of the pain is unknown.Answering your caregiver's questions about your overall health and symptoms is one of the most accurate ways to make sure the cause of your pain is not dangerous. If your caregiver needs more information, he or she may order lab work or imaging tests (X-rays or MRIs).However, even if imaging tests show changes in your back, this usually does not require surgery. HOME CARE INSTRUCTIONS For many people, back pain returns.Since low back pain is rarely dangerous, it is often a condition that people can learn to [facility] their own.   Remain active. It is stressful on the back to sit or stand in one place. Do not sit, drive, or stand in one place for more than 30 minutes at a time. Take short walks on level surfaces as soon as pain allows.Try to increase the length of time you walk each day.  Do not stay in bed.Resting more than 1 or 2 days can delay your recovery.  Do not avoid exercise or work.Your body is made to move.It is not dangerous to be active, even though your back may hurt.Your back will likely heal faster if you  return to being active before your pain is gone.  Pay attention to your body when you bend and lift. Many people have less discomfortwhen lifting if they bend their knees, keep the load close to their bodies,and avoid twisting. Often, the most comfortable positions are those that put less stress on your recovering back.  Find a comfortable position to sleep. Use a firm mattress and lie on your side with your knees slightly bent. If you lie on your back, put a pillow under your knees.  Only take over-the-counter or prescription medicines as directed by your caregiver. Over-the-counter medicines to reduce pain and inflammation are often the most helpful.Your caregiver may prescribe muscle relaxant drugs.These medicines help dull your pain so you can more quickly return to your normal activities and healthy exercise.  Put ice on the injured area.  Put ice in a plastic bag.  Place a towel between your skin and the bag.  Leave the ice on  for 15-20 minutes, 03-04 times a day for the first 2 to 3 days. After that, ice and heat may be alternated to reduce pain and spasms.  Ask your caregiver about trying back exercises and gentle massage. This may be of some benefit.  Avoid feeling anxious or stressed.Stress increases muscle tension and can worsen back pain.It is important to recognize when you are anxious or stressed and learn ways to manage it.Exercise is a great option. SEEK MEDICAL CARE IF:  You have pain that is not relieved with rest or medicine.  You have pain that does not improve in 1 week.  You have new symptoms.  You are generally not feeling well. SEEK IMMEDIATE MEDICAL CARE IF:   You have pain that radiates from your back into your legs.  You develop new bowel or bladder control problems.  You have unusual weakness or numbness in your arms or legs.  You develop nausea or vomiting.  You develop abdominal pain.  You feel faint. Document Released: 10/22/2005  Document Revised: 04/22/2012 Document Reviewed: 02/23/2014 [facility] Patient Information 2015 [place], [place]. This information is not intended to replace advice given to you by your health care provider. Make sure you discuss any questions you have with your health care provider.      I personally performed the services described in this documentation, which was scribed in my presence. The recorded information has been reviewed and considered, and addended by me as needed.

## 2015-03-05 NOTE — Patient Instructions (Signed)
Can increase atrovent nasal spray to twice per day if needed, afrin nasal spray Monday night and Tuesday only if needed. Saline nasal spray atleast 4 times per day, drink plenty of fluids. augmentin if not improving in next few days.   I sent message to schedulers about your physical therapy.  Continue flexeril if needed, see information and exercises in booklet.   Return to the clinic or go to the nearest emergency room if any of your symptoms worsen or new symptoms occur.  Serous Otitis Media Serous otitis media is fluid in the middle ear space. This space contains the bones for hearing and air. Air in the middle ear space helps to transmit sound.  The air gets there through the eustachian tube. This tube goes from the back of the nose (nasopharynx) to the middle ear space. It keeps the pressure in the middle ear the same as the outside world. It also helps to drain fluid from the middle ear space. CAUSES  Serous otitis media occurs when the eustachian tube gets blocked. Blockage can come from:  Ear infections.  Colds and other upper respiratory infections.  Allergies.  Irritants such as cigarette smoke.  Sudden changes in air pressure (such as descending in an airplane).  Enlarged adenoids.  A mass in the nasopharynx. During colds and upper respiratory infections, the middle ear space can become temporarily filled with fluid. This can happen after an ear infection also. Once the infection clears, the fluid will generally drain out of the ear through the eustachian tube. If it does not, then serous otitis media occurs. SIGNS AND SYMPTOMS   Hearing loss.  A feeling of fullness in the ear, without pain.  Young children may not show any symptoms but may show slight behavioral changes, such as agitation, ear pulling, or crying. DIAGNOSIS  Serous otitis media is diagnosed by an ear exam. Tests may be done to check on the movement of the eardrum. Hearing exams may also be  done. TREATMENT  The fluid most often goes away without treatment. If allergy is the cause, allergy treatment may be helpful. Fluid that persists for several months may require minor surgery. A small tube is placed in the eardrum to:  Drain the fluid.  Restore the air in the middle ear space. In certain situations, antibiotic medicines are used to avoid surgery. Surgery may be done to remove enlarged adenoids (if this is the cause). HOME CARE INSTRUCTIONS   Keep children away from tobacco smoke.  Keep all follow-up visits as directed by your health care provider. SEEK MEDICAL CARE IF:   Your hearing is not better in 3 months.  Your hearing is worse.  You have ear pain.  You have drainage from the ear.  You have dizziness.  You have serous otitis media only in one ear or have any bleeding from your nose (epistaxis).  You notice a lump on your neck. MAKE SURE YOU:  Understand these instructions.   Will watch your condition.   Will get help right away if you are not doing well or get worse.  Document Released: 01/12/2004 Document Revised: 03/08/2014 Document Reviewed: 05/19/2013 [facility] Patient Information 2015 [place], [place]. This information is not intended to replace advice given to you by your health care provider. Make sure you discuss any questions you have with your health care provider.   Sinusitis Sinusitis is redness, soreness, and inflammation of the paranasal sinuses. Paranasal sinuses are air pockets within the bones of your face (beneath the  eyes, the middle of the forehead, or above the eyes). In healthy paranasal sinuses, mucus is able to drain out, and air is able to circulate through them by way of your nose. However, when your paranasal sinuses are inflamed, mucus and air can become trapped. This can allow bacteria and other germs to grow and cause infection. Sinusitis can develop quickly and last only a short time (acute) or continue over a long period  (chronic). Sinusitis that lasts for more than 12 weeks is considered chronic.  CAUSES  Causes of sinusitis include:  Allergies.  Structural abnormalities, such as displacement of the cartilage that separates your nostrils (deviated septum), which can decrease the air flow through your nose and sinuses and affect sinus drainage.  Functional abnormalities, such as when the small hairs (cilia) that line your sinuses and help remove mucus do not work properly or are not present. SIGNS AND SYMPTOMS  Symptoms of acute and chronic sinusitis are the same. The primary symptoms are pain and pressure around the affected sinuses. Other symptoms include:  Upper toothache.  Earache.  Headache.  Bad breath.  Decreased sense of smell and taste.  A cough, which worsens when you are lying flat.  Fatigue.  Fever.  Thick drainage from your nose, which often is green and may contain pus (purulent).  Swelling and warmth over the affected sinuses. DIAGNOSIS  Your health care provider will perform a physical exam. During the exam, your health care provider may:  Look in your nose for signs of abnormal growths in your nostrils (nasal polyps).  Tap over the affected sinus to check for signs of infection.  View the inside of your sinuses (endoscopy) using an imaging device that has a light attached (endoscope). If your health care provider suspects that you have chronic sinusitis, one or more of the following tests may be recommended:  Allergy tests.  Nasal culture. A sample of mucus is taken from your nose, sent to a lab, and screened for bacteria.  Nasal cytology. A sample of mucus is taken from your nose and examined by your health care provider to determine if your sinusitis is related to an allergy. TREATMENT  Most cases of acute sinusitis are related to a viral infection and will resolve on their own within 10 days. Sometimes medicines are prescribed to help relieve symptoms (pain medicine,  decongestants, nasal steroid sprays, or saline sprays).  However, for sinusitis related to a bacterial infection, your health care provider will prescribe antibiotic medicines. These are medicines that will help kill the bacteria causing the infection.  Rarely, sinusitis is caused by a fungal infection. In theses cases, your health care provider will prescribe antifungal medicine. For some cases of chronic sinusitis, surgery is needed. Generally, these are cases in which sinusitis recurs more than 3 times per year, despite other treatments. HOME CARE INSTRUCTIONS   Drink plenty of water. Water helps thin the mucus so your sinuses can drain more easily.  Use a humidifier.  Inhale steam 3 to 4 times a day (for example, sit in the bathroom with the shower running).  Apply a warm, moist washcloth to your face 3 to 4 times a day, or as directed by your health care provider.  Use saline nasal sprays to help moisten and clean your sinuses.  Take medicines only as directed by your health care provider.  If you were prescribed either an antibiotic or antifungal medicine, finish it all even if you start to feel better. [place]  CARE IF:  You have increasing pain or severe headaches.  You have nausea, vomiting, or drowsiness.  You have swelling around your face.  You have vision problems.  You have a stiff neck.  You have difficulty breathing. MAKE SURE YOU:   Understand these instructions.  Will watch your condition.  Will get help right away if you are not doing well or get worse. Document Released: 10/22/2005 Document Revised: 03/08/2014 Document Reviewed: 11/06/2011 [facility] Patient Information 2015 [place], [place]. This information is not intended to replace advice given to you by your health care provider. Make sure you discuss any questions you have with your health care provider.    Back Pain, Adult Low back pain is very common. About 1 in 5 people have back  pain.The cause of low back pain is rarely dangerous. The pain often gets better over time.About half of people with a sudden onset of back pain feel better in just 2 weeks. About 8 in 10 people feel better by 6 weeks.  CAUSES Some common causes of back pain include:  Strain of the muscles or ligaments supporting the spine.  Wear and tear (degeneration) of the spinal discs.  Arthritis.  Direct injury to the back. DIAGNOSIS Most of the time, the direct cause of low back pain is not known.However, back pain can be treated effectively even when the exact cause of the pain is unknown.Answering your caregiver's questions about your overall health and symptoms is one of the most accurate ways to make sure the cause of your pain is not dangerous. If your caregiver needs more information, he or she may order lab work or imaging tests (X-rays or MRIs).However, even if imaging tests show changes in your back, this usually does not require surgery. HOME CARE INSTRUCTIONS For many people, back pain returns.Since low back pain is rarely dangerous, it is often a condition that people can learn to [facility] their own.   Remain active. It is stressful on the back to sit or stand in one place. Do not sit, drive, or stand in one place for more than 30 minutes at a time. Take short walks on level surfaces as soon as pain allows.Try to increase the length of time you walk each day.  Do not stay in bed.Resting more than 1 or 2 days can delay your recovery.  Do not avoid exercise or work.Your body is made to move.It is not dangerous to be active, even though your back may hurt.Your back will likely heal faster if you return to being active before your pain is gone.  Pay attention to your body when you bend and lift. Many people have less discomfortwhen lifting if they bend their knees, keep the load close to their bodies,and avoid twisting. Often, the most comfortable positions are those that put less  stress on your recovering back.  Find a comfortable position to sleep. Use a firm mattress and lie on your side with your knees slightly bent. If you lie on your back, put a pillow under your knees.  Only take over-the-counter or prescription medicines as directed by your caregiver. Over-the-counter medicines to reduce pain and inflammation are often the most helpful.Your caregiver may prescribe muscle relaxant drugs.These medicines help dull your pain so you can more quickly return to your normal activities and healthy exercise.  Put ice on the injured area.  Put ice in a plastic bag.  Place a towel between your skin and the bag.  Leave the ice on for 15-20  minutes, 03-04 times a day for the first 2 to 3 days. After that, ice and heat may be alternated to reduce pain and spasms.  Ask your caregiver about trying back exercises and gentle massage. This may be of some benefit.  Avoid feeling anxious or stressed.Stress increases muscle tension and can worsen back pain.It is important to recognize when you are anxious or stressed and learn ways to manage it.Exercise is a great option. SEEK MEDICAL CARE IF:  You have pain that is not relieved with rest or medicine.  You have pain that does not improve in 1 week.  You have new symptoms.  You are generally not feeling well. SEEK IMMEDIATE MEDICAL CARE IF:   You have pain that radiates from your back into your legs.  You develop new bowel or bladder control problems.  You have unusual weakness or numbness in your arms or legs.  You develop nausea or vomiting.  You develop abdominal pain.  You feel faint. Document Released: 10/22/2005 Document Revised: 04/22/2012 Document Reviewed: 02/23/2014 [facility] Patient Information 2015 [place], [place]. This information is not intended to replace advice given to you by your health care provider. Make sure you discuss any questions you have with your health care provider.

## 2015-03-07 ENCOUNTER — Ambulatory Visit (INDEPENDENT_AMBULATORY_CARE_PROVIDER_SITE_OTHER): Payer: 59 | Admitting: Cardiovascular Disease

## 2015-03-07 ENCOUNTER — Encounter: Payer: Self-pay | Admitting: Cardiovascular Disease

## 2015-03-07 VITALS — BP 132/100 | HR 105 | Ht 70.0 in | Wt 247.8 lb

## 2015-03-07 DIAGNOSIS — I10 Essential (primary) hypertension: Secondary | ICD-10-CM

## 2015-03-07 MED ORDER — ASPIRIN EC 81 MG PO TBEC: 81.0000 mg | DELAYED_RELEASE_TABLET | Freq: Every day | ORAL | Status: AC

## 2015-03-07 MED ORDER — HYDROCHLOROTHIAZIDE 25 MG PO TABS: 25.0000 mg | ORAL_TABLET | Freq: Every day | ORAL | Status: DC

## 2015-03-07 MED ORDER — AMLODIPINE BESYLATE 5 MG PO TABS: 5.0000 mg | ORAL_TABLET | Freq: Every day | ORAL | Status: DC

## 2015-03-07 NOTE — Patient Instructions (Addendum)
Medication Instructions:  Your physician has recommended you make the following change in your medication:  1. INCREASE HCTZ to 25mg  take one by mouth daily 2. START Amlodipine 5mg  take one by mouth daily 3. START Aspirin 81mg  take one by mouth daily  Labwork: Your physician recommends that you return for lab work in: 2 WEEKS (BMP)   Testing/Procedures: No new orders.  Follow-Up: Your physician wants you to follow-up in: December 2016 with Dr Burt Knack.  You will receive a reminder letter in the mail two months in advance. If you don't receive a letter, please call our office to schedule the follow-up appointment.  Any Other Special Instructions Will Be Listed Below (If Applicable).

## 2015-03-07 NOTE — Progress Notes (Signed)
Cardiology Office Note   Date:  03/07/2015   ID:  Randall Reed, DOB May 18, 1971, MRN [id number]  PCP:  Leandrew Koyanagi, MD  Cardiologist:  Sherren Mocha, MD    No chief complaint on file.    History of Present Illness: Randall Reed is a 44 y.o. male who presents for cardiology follow-up. The patient is been evaluated in the past for chest pain and tachycardia palpitations. In 2013 he underwent a cardiac CTA demonstrating no coronary artery disease. Last year he underwent an exercise treadmill study which showed no evidence of ischemia with good exercise tolerance at 12 minutes on the Bruce protocol.  The patient presents today for uncontrolled hypertension. He has been started on hydrochlorothiazide but continues to have elevated blood pressures with average readings in the 140s over 100s. His blood pressure is high, he experiences headaches and visual disturbances. Also feels palpitations at night time. He's had no recent exertional chest pain or pressure or dyspnea. Even with his exercise program where he exercises vigorously 3 days per week, his weight has increased. He denies any symptoms related to physical exertion.   Past Medical History  Diagnosis Date  . GERD (gastroesophageal reflux disease)   . Anxiety   . Seasonal allergic reaction   . Depression   . Arthritis   . Asthma     Past Surgical History  Procedure Laterality Date  . None    . None      Current Outpatient Prescriptions  Medication Sig Dispense Refill  . acetic acid-hydrocortisone (VOSOL-HC) otic solution Place 3 drops into both ears 3 (three) times daily. Use as needed to prevent swimmer's ear 10 mL 5  . albuterol (PROVENTIL HFA;VENTOLIN HFA) 108 (90 BASE) MCG/ACT inhaler Inhale 2 puffs into the lungs every 6 (six) hours as needed. 1 Inhaler 5  . ALPRAZolam (XANAX) 1 MG tablet Take 1 tablet (1 mg total) by mouth 3 (three) times daily as needed. 90 tablet 5  . amoxicillin-clavulanate (AUGMENTIN)  875-125 MG per tablet Take 1 tablet by mouth 2 (two) times daily. 20 tablet 0  . clidinium-chlordiazePOXIDE (LIBRAX) 5-2.5 MG per capsule Take 1 capsule by mouth 2 (two) times daily. 60 capsule 4  . cyclobenzaprine (FLEXERIL) 10 MG tablet Take 1 tablet (10 mg total) by mouth 3 (three) times daily as needed for muscle spasms. May choose to use only at bedtime due to sleepiness. 30 tablet 0  . diclofenac (VOLTAREN) 75 MG EC tablet Take 1 tablet (75 mg total) by mouth 2 (two) times daily. 60 tablet 5  . esomeprazole (NEXIUM) 40 MG capsule Take 1 capsule (40 mg total) by mouth daily before breakfast. 30 capsule 11  . Fexofenadine HCl (ALLEGRA PO) Take by mouth daily.    . fluticasone (FLONASE) 50 MCG/ACT nasal spray INSTILL 2 SPRAYS IN EACH NOSTRIL DAILY 16 g 10  . Fluticasone-Salmeterol (ADVAIR DISKUS) 250-50 MCG/DOSE AEPB Inhale 1 puff into the lungs 2 (two) times daily. 1 each 3  . hydrocortisone (ANUSOL-HC) 2.5 % rectal cream Place rectally 2 (two) times daily. 30 g 3  . ipratropium (ATROVENT HFA) 17 MCG/ACT inhaler INHALE 2 PUFFS INTO THE LUNGS TWICE DAILY OR AS DIRECTED 12.9 g 3  . ipratropium (ATROVENT) 0.06 % nasal spray Place 2 sprays into the nose 2 (two) times daily. 15 mL 9  . ketoprofen (ORUDIS) 75 MG capsule Take 1 capsule (75 mg total) by mouth 4 (four) times daily as needed. 30 capsule 5  . Lorcaserin HCl 10 MG  TABS Take 1 tablet by mouth daily.    . meloxicam (MOBIC) 15 MG tablet Take 1 tablet (15 mg total) by mouth daily. 30 tablet 0  . montelukast (SINGULAIR) 10 MG tablet Take 1 tablet (10 mg total) by mouth at bedtime. 30 tablet 11  . rizatriptan (MAXALT-MLT) 10 MG disintegrating tablet TAKE AS DIRECTED 9 tablet 11  . sildenafil (VIAGRA) 100 MG tablet Take 1/2 to 1 tablet by mouth up to 12 hr before needed 5 tablet 11  . zolpidem (AMBIEN) 10 MG tablet TAKE 1 TABLET BY MOUTH EVERY DAY AT BEDTIME AS NEEDED FOR SLEEP 30 tablet 5  . amLODipine (NORVASC) 5 MG tablet Take 1 tablet (5 mg  total) by mouth daily. 90 tablet 3  . aspirin EC 81 MG tablet Take 1 tablet (81 mg total) by mouth daily.    . hydrochlorothiazide (HYDRODIURIL) 25 MG tablet Take 1 tablet (25 mg total) by mouth daily. 90 tablet 3   No current facility-administered medications for this visit.    Allergies:   Desloratadine; Hydrocod polst-cpm polst er; Loratadine; Telithromycin; Tussionex pennkinetic er; and Engineer, water   Social History:  The patient  reports that he has never smoked. He has never used smokeless tobacco. He reports that he drinks about 1.8 oz of alcohol per week. He reports that he does not use illicit drugs.   Family History:  The patient's  family history includes Breast cancer in an other family member; Cancer in an other family member; Coronary artery disease in his paternal grandfather; Coronary artery disease (age of onset: 35) in his father; Heart attack in his father, maternal grandfather, and another family member; Hyperlipidemia in an other family member; Hypertension in an other family member.    ROS:  Please see the history of present illness.  Otherwise, review of systems is positive for weight gain, visual disturbance, back pain, muscle pain, fatigue, palpitations, and anxiety..  All other systems are reviewed and negative.    PHYSICAL EXAM: VS:  BP 132/100 mmHg  Pulse 105  Ht 5\' 10"  (1.778 m)  Wt 247 lb 12.8 oz (112.401 kg)  BMI 35.56 kg/m2 , BMI Body mass index is 35.56 kg/(m^2). GEN: Well nourished, well developed, pleasant overweight male in no acute distress HEENT: normal Neck: no JVD, no masses. No carotid bruits Cardiac: RRR without murmur or gallop                Respiratory:  clear to auscultation bilaterally, normal work of breathing GI: soft, nontender, nondistended, + BS MS: no deformity or atrophy Ext: no pretibial edema, pedal pulses 2+= bilaterally Skin: warm and dry, no rash Neuro:  Strength and sensation are intact Psych: euthymic mood, full affect  EKG:   EKG is not ordered today.  Recent Labs: No results found for requested labs within last 365 days.   Lipid Panel     Component Value Date/Time   CHOL 186 02/09/2013 0903   TRIG 118 02/09/2013 0903   HDL 41 02/09/2013 0903   CHOLHDL 4.5 02/09/2013 0903   VLDL 24 02/09/2013 0903   LDLCALC 121* 02/09/2013 0903      Wt Readings from Last 3 Encounters:  03/07/15 247 lb 12.8 oz (112.401 kg)  03/05/15 244 lb (110.678 kg)  02/20/15 244 lb 12.8 oz (111.041 kg)     Cardiac Studies Reviewed: ETT 10/27/14: Comments: Patient presents today for routine GXT. Has +FH for CAD. Has had history of HTN - previously on medicine but lost weight.  BP recently tracking back up. BP is elevated at beginning of today's study. No active chest pain. Considering dietary aids to lose weight. Today the patient exercised on the standard Bruce protocol for a total of 12 minutes.  Good exercise tolerance.  Adequate blood pressure response but patient is hypertensive.  Clinically negative for chest pain. Test was stopped due to patient's desire to stop/target HR achieved.  EKG negative for ischemia. No significant arrhythmia noted.   Recommendations: CV risk factor modification  Monitor BP at home. May need to restart meds - has been on Lisinopril HCT in the past.  He will see if he has had lipids checked by Dr Buddy Duty - would aggressively treat if elevated.   ASSESSMENT AND PLAN: Essential HTN, uncontrolled: weight gain is a major issue and we discussed this at length today. He limits sodium intake. Recommend increase HCTZ to 25 mg and add amlodipine 5 mg daily. He monitors BP closely. Will check a BMET in 2 weeks. Keep scheduled office visit in December.  Tachypalpitations: he associates with high BP. Will see how he does with med changes as described. Followup as planned.   Prevention: add ASA 81 mg daily considering multiple CVRF's including family hx CAD/MI.  Current medicines are reviewed with the  patient today.  The patient does not have concerns regarding medicines.  Labs/ tests ordered today include:   Orders Placed This Encounter  Procedures  . Basic Metabolic Panel (BMET)    Disposition:   FU in December  Signed, Talyssa Gibas, MD  03/07/2015 6:28 PM    [address], [place], [place]  [address] Phone: [phone]; Fax: [phone]

## 2015-03-10 ENCOUNTER — Other Ambulatory Visit: Payer: Self-pay | Admitting: Family Medicine

## 2015-03-10 NOTE — Telephone Encounter (Signed)
Called patient and advised him to call [facility] to set his appt up patient will do so

## 2015-03-10 NOTE — Telephone Encounter (Signed)
-----   Message from Hewitt Shorts sent at 03/10/2015 10:12 AM EDT ----- Pt needs to call and make own appt with [facility] at [phone]  She does not take insurance and it will cost 75 first visit and 65 for follow up   Thanks Butch Penny ----- Message -----    From: Yvette Rack    Sent: 03/08/2015   4:28 PM      To: Hewitt Shorts  Please respond ----- Message -----    From: [name], MD    Sent: 03/05/2015  10:22 AM      To: [facility] Scheduling Pool  Please check staus of physical therapy referral from Dr. Laney Pastor and advise patient.  Thanks - JG.

## 2015-03-10 NOTE — Telephone Encounter (Signed)
Randall Reed, I meant to just copy [name] last staff message about the Nexium PA into pt's chart to see if you had received PA info from pharmacy or could check on this? Thanks

## 2015-03-10 NOTE — Telephone Encounter (Signed)
-----   Message from Yvette Rack sent at 03/10/2015 12:26 PM EDT ----- Regarding: RX Patient called and wanted to know if you had heard anything from the pharmacy about his Nexium states that insurance declined it ----- Message -----    From: Hewitt Shorts    Sent: 03/10/2015  10:12 AM      To: Yvette Rack  Pt needs to call and make own appt with [facility] at [phone]  She does not take insurance and it will cost 75 first visit and 65 for follow up   Thanks Butch Penny ----- Message -----    From: Yvette Rack    Sent: 03/08/2015   4:28 PM      To: Hewitt Shorts  Please respond ----- Message -----    From: [name], MD    Sent: 03/05/2015  10:22 AM      To: [facility] Scheduling Pool  Please check staus of physical therapy referral from Dr. Laney Pastor and advise patient.  Thanks - JG.

## 2015-03-14 NOTE — Telephone Encounter (Signed)
PA for Nexium started.

## 2015-03-15 MED ORDER — PANTOPRAZOLE SODIUM 40 MG PO TBEC: 40.0000 mg | DELAYED_RELEASE_TABLET | Freq: Every day | ORAL | Status: DC

## 2015-03-15 NOTE — Telephone Encounter (Signed)
We received denial for Nexium (plan exclusion). Called [employer] to find out what PPI would be covered and was advised that pantoprazole 40 mg tabs are covered w/out a PA. Can we Rx this for pt? Pended.

## 2015-03-22 ENCOUNTER — Other Ambulatory Visit (INDEPENDENT_AMBULATORY_CARE_PROVIDER_SITE_OTHER): Payer: 59 | Admitting: *Deleted

## 2015-03-22 DIAGNOSIS — I10 Essential (primary) hypertension: Secondary | ICD-10-CM

## 2015-03-22 LAB — BASIC METABOLIC PANEL
BUN: 20 mg/dL (ref 6–23)
CALCIUM: 9.7 mg/dL (ref 8.4–10.5)
CO2: 30 meq/L (ref 19–32)
Chloride: 101 mEq/L (ref 96–112)
Creatinine, Ser: 1.13 mg/dL (ref 0.40–1.50)
GFR: 74.25 mL/min (ref >60.00)
Glucose, Bld: 92 mg/dL (ref 70–99)
Potassium: 3.9 mEq/L (ref 3.5–5.1)
Sodium: 137 mEq/L (ref 135–145)

## 2015-03-24 NOTE — Telephone Encounter (Signed)
Called pt to report new Rx for pantoprazole. He reported that he has tried and failed pantoprazole in the past several years ago. GI tried him on that and generic/name prilosec which also failed and other meds. Nexium was effective and has been taking it until his new [employer] ins denied. I will call ins and find out how to appeal this. Pt also reported that ins is not covering his Maxalt which I have not gotten a req from pharm to do PA. Called insurance and they do not see a denied claim for generic Maxalt, but advised plan will pay max of 4 tabs/mos and it does not require a PA. Also advised Nexium is plan exclusion and will not cover the esomeprazole at all. The plan prefers the omeprazole, pantoprazole, dexilant and rabeprazole.

## 2015-03-24 NOTE — Telephone Encounter (Signed)
Called pt back and advised to have him tell pharm to run the Maxalt for 4 tabs only/mos and it should be covered. He will try the pantoprazole that was sent in and let us know if it is not effective.

## 2015-04-07 ENCOUNTER — Encounter: Payer: Self-pay | Admitting: Cardiovascular Disease

## 2015-04-13 ENCOUNTER — Other Ambulatory Visit: Payer: Self-pay

## 2015-04-13 DIAGNOSIS — I10 Essential (primary) hypertension: Secondary | ICD-10-CM

## 2015-04-13 MED ORDER — AMLODIPINE BESYLATE 10 MG PO TABS: 10.0000 mg | ORAL_TABLET | Freq: Every day | ORAL | Status: DC

## 2015-04-25 ENCOUNTER — Ambulatory Visit (INDEPENDENT_AMBULATORY_CARE_PROVIDER_SITE_OTHER): Payer: 59 | Admitting: Internal Medicine

## 2015-04-25 VITALS — BP 106/74 | HR 101 | Temp 98.2°F | Resp 16 | Ht 70.5 in | Wt 237.4 lb

## 2015-04-25 DIAGNOSIS — J0101 Acute recurrent maxillary sinusitis: Secondary | ICD-10-CM | POA: Diagnosis not present

## 2015-04-25 DIAGNOSIS — J309 Allergic rhinitis, unspecified: Secondary | ICD-10-CM | POA: Diagnosis not present

## 2015-04-25 DIAGNOSIS — M62830 Muscle spasm of back: Secondary | ICD-10-CM | POA: Diagnosis not present

## 2015-04-25 DIAGNOSIS — F411 Generalized anxiety disorder: Secondary | ICD-10-CM | POA: Diagnosis not present

## 2015-04-25 DIAGNOSIS — G43709 Chronic migraine without aura, not intractable, without status migrainosus: Secondary | ICD-10-CM | POA: Diagnosis not present

## 2015-04-25 DIAGNOSIS — K2100 Gastro-esophageal reflux disease with esophagitis, without bleeding: Secondary | ICD-10-CM

## 2015-04-25 DIAGNOSIS — J4520 Mild intermittent asthma, uncomplicated: Secondary | ICD-10-CM | POA: Diagnosis not present

## 2015-04-25 MED ORDER — ESOMEPRAZOLE MAGNESIUM 40 MG PO CPDR: 40.0000 mg | DELAYED_RELEASE_CAPSULE | Freq: Every day | ORAL | Status: DC

## 2015-04-25 MED ORDER — SILDENAFIL CITRATE 100 MG PO TABS: ORAL_TABLET | ORAL | Status: DC

## 2015-04-25 MED ORDER — ALPRAZOLAM 1 MG PO TABS: 1.0000 mg | ORAL_TABLET | Freq: Three times a day (TID) | ORAL | Status: DC | PRN

## 2015-04-25 MED ORDER — RIZATRIPTAN BENZOATE 10 MG PO TBDP: ORAL_TABLET | ORAL | Status: DC

## 2015-04-25 MED ORDER — HYDROCORTISONE 2.5 % RE CREA: TOPICAL_CREAM | Freq: Two times a day (BID) | RECTAL | Status: DC

## 2015-04-25 MED ORDER — PREDNISONE 20 MG PO TABS: ORAL_TABLET | ORAL | Status: DC

## 2015-04-25 MED ORDER — FLUTICASONE PROPIONATE 50 MCG/ACT NA SUSP: NASAL | Status: DC

## 2015-04-25 MED ORDER — CILIDINIUM-CHLORDIAZEPOXIDE 2.5-5 MG PO CAPS: 1.0000 | ORAL_CAPSULE | Freq: Two times a day (BID) | ORAL | Status: DC

## 2015-04-25 MED ORDER — IPRATROPIUM BROMIDE HFA 17 MCG/ACT IN AERS: INHALATION_SPRAY | RESPIRATORY_TRACT | Status: DC

## 2015-04-25 MED ORDER — KETOPROFEN 75 MG PO CAPS: 75.0000 mg | ORAL_CAPSULE | Freq: Four times a day (QID) | ORAL | Status: DC | PRN

## 2015-04-25 MED ORDER — AMOXICILLIN-POT CLAVULANATE 875-125 MG PO TABS: 1.0000 | ORAL_TABLET | Freq: Two times a day (BID) | ORAL | Status: DC

## 2015-04-25 MED ORDER — CYCLOBENZAPRINE HCL 10 MG PO TABS: 10.0000 mg | ORAL_TABLET | Freq: Three times a day (TID) | ORAL | Status: DC | PRN

## 2015-04-25 MED ORDER — IPRATROPIUM BROMIDE 0.06 % NA SOLN: 2.0000 | Freq: Two times a day (BID) | NASAL | Status: DC

## 2015-04-25 MED ORDER — MONTELUKAST SODIUM 10 MG PO TABS: 10.0000 mg | ORAL_TABLET | Freq: Every day | ORAL | Status: DC

## 2015-04-25 NOTE — Progress Notes (Signed)
Subjective:  This chart was scribed for Randall Lin MD, by Tamsen Roers, at [facility].  This patient was seen in room 11  and the patient's care was started at 1:50 PM.   Chief Complaint  Patient presents with  . Cough    x 1 week  . Nasal Congestion    x 1 week  . Medication Refill    would like a 6 month supply, xanax,albuterol,librax,nexium,flonase,atrovent,ketoprofen,singulair,protonix,maxalt-mlt,viagra  . Medication Refill    hydrocortisone,flexeril     Patient ID: Randall Reed, male    DOB: 08/20/1971, 44 y.o.   MRN: [id number]  HPI  HPI Comments: Randall Reed is a 44 y.o. male who presents to the [facility] complaining of a cough and nasal congestion onset 1 week ago. Patient is also concerned of a "bump" on penile shaft x 1 wkthat he would like to be examined.    In addition, he is requesting medication refill today (6 month supplies).   Cough/congestion: 9 days ago, patient was at the pool and was around a smoker and the smoke triggered his asthma. 7 days ago, his symptoms of a productive cough and congestion began.  He has associated symptoms of wheezing, post nasal drip and has to cough when he takes a deep breath or laughs.  He has tried Sudafed and Mucin-ex but denies any relief.  He has also been using his Atrovent inhaler.  See history of extensive reactive airway disease and allergic condition.  Blood Pressure: His Cardiologist increased his dose of Amlodipine and hydrochlorothiazide which decreased his blood pressure.   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 He is looking for a PCP in that area.     Patient Active Problem List   Diagnosis Date Noted  . 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 rotator cuff tear 10/17/2013  . ED (erectile dysfunction) 07/30/2013  . Palpitations 02/21/2012  . BMI  33.0-33.9,adult 02/04/2012  . Allergic rhinitis--- stable on current regimen until recent exposure  02/04/2012  . GERD (gastroesophageal reflux disease) : Insurance required for Tonics instead of Nexium and this has failed to control symptoms. He originally was controlled by Omeprazole and Zegried but insurance no longer paid for that.  02/04/2012  . Migraine----stable with current treatment plan per neurology  02/04/2012  . GAD (generalized anxiety disorder)----stable on current meds although he worries about increase in anxiety with his City change, job change  02/04/2012  . Insomnia--- response to meds  02/04/2012  . HYPERTENSION, BENIGN----see recent cardiology notes  04/13/2009  . CHEST PAIN-UNSPECIFIED--- no current complaints  04/13/2009    Past Surgical History  Procedure Laterality Date  . None    . None     Allergies  Allergen Reactions  . Desloratadine Other (See Comments)    CLARINEX-"severe headache"  . Hydrocod Polst-Cpm Polst Er Itching  . Loratadine Other (See Comments)    "severe headache"  . Telithromycin   . Tussionex Pennkinetic Er [Hydrocod Polst-Cpm Polst Er] Itching  . Levbid [Hyoscyamine Sulfate] Rash   Prior to Admission medications   Medication Sig Start Date End Date Taking? Authorizing Provider  acetic acid-hydrocortisone (VOSOL-HC) otic solution Place 3 drops into both ears 3 (three) times daily. Use as needed to prevent swimmer's ear 01/10/15  Yes Leandrew Koyanagi, MD  albuterol (PROVENTIL HFA;VENTOLIN HFA) 108 (90 BASE) MCG/ACT inhaler Inhale 2 puffs into the lungs  every 6 (six) hours as needed. 01/10/15  Yes Leandrew Koyanagi, MD  ALPRAZolam [name]) 1 MG tablet Take 1 tablet (1 mg total) by mouth 3 (three) times daily as needed. 01/10/15  Yes Leandrew Koyanagi, MD  amLODipine (NORVASC) 10 MG tablet Take 1 tablet (10 mg total) by mouth daily. 04/13/15  Yes Sherren Mocha, MD  aspirin EC 81 MG tablet Take 1 tablet (81 mg total) by mouth daily. 03/07/15  Yes  Sherren Mocha, MD  clidinium-chlordiazePOXIDE (LIBRAX) 5-2.5 MG per capsule Take 1 capsule by mouth 2 (two) times daily. 01/10/15  Yes Leandrew Koyanagi, MD  cyclobenzaprine (FLEXERIL) 10 MG tablet Take 1 tablet (10 mg total) by mouth 3 (three) times daily as needed for muscle spasms. May choose to use only at bedtime due to sleepiness. 03/05/15  Yes [name], MD  diclofenac (VOLTAREN) 75 MG EC tablet Take 1 tablet (75 mg total) by mouth 2 (two) times daily. 10/17/13  Yes Leandrew Koyanagi, MD  esomeprazole (NEXIUM) 40 MG capsule Take 1 capsule (40 mg total) by mouth daily before breakfast. 01/10/15  Yes Leandrew Koyanagi, MD  Fexofenadine HCl (ALLEGRA PO) Take by mouth daily.   Yes Historical Provider, MD  fluticasone (FLONASE) 50 MCG/ACT nasal spray INSTILL 2 SPRAYS IN EACH NOSTRIL DAILY 01/10/15  Yes Leandrew Koyanagi, MD  Fluticasone-Salmeterol (ADVAIR DISKUS) 250-50 MCG/DOSE AEPB Inhale 1 puff into the lungs 2 (two) times daily. 01/10/15  Yes Leandrew Koyanagi, MD  hydrochlorothiazide (HYDRODIURIL) 25 MG tablet Take 1 tablet (25 mg total) by mouth daily. 03/07/15  Yes Sherren Mocha, MD  hydrocortisone (ANUSOL-HC) 2.5 % rectal cream Place rectally 2 (two) times daily. 08/27/12  Yes Leandrew Koyanagi, MD  ipratropium (ATROVENT HFA) 17 MCG/ACT inhaler INHALE 2 PUFFS INTO THE LUNGS TWICE DAILY OR AS DIRECTED 01/10/15  Yes Leandrew Koyanagi, MD  ipratropium (ATROVENT) 0.06 % nasal spray Place 2 sprays into the nose 2 (two) times daily. 01/10/15  Yes Leandrew Koyanagi, MD  ketoprofen (ORUDIS) 75 MG capsule Take 1 capsule (75 mg total) by mouth 4 (four) times daily as needed. 01/10/15  Yes Leandrew Koyanagi, MD  Lorcaserin HCl 10 MG TABS Take 1 tablet by mouth daily.   Yes Historical Provider, MD  meloxicam (MOBIC) 15 MG tablet Take 1 tablet (15 mg total) by mouth daily. 02/20/15  Yes Leandrew Koyanagi, MD  montelukast (SINGULAIR) 10 MG tablet Take 1 tablet (10 mg total) by mouth at bedtime. 01/10/15   Yes Leandrew Koyanagi, MD  pantoprazole (PROTONIX) 40 MG tablet Take 1 tablet (40 mg total) by mouth daily. 03/15/15  Yes Leandrew Koyanagi, MD  rizatriptan (MAXALT-MLT) 10 MG disintegrating tablet TAKE AS DIRECTED 01/10/15  Yes Leandrew Koyanagi, MD  sildenafil (VIAGRA) 100 MG tablet Take 1/2 to 1 tablet by mouth up to 12 hr before needed 01/10/15  Yes Leandrew Koyanagi, MD  zolpidem (AMBIEN) 10 MG tablet TAKE 1 TABLET BY MOUTH EVERY DAY AT BEDTIME AS NEEDED FOR SLEEP 01/10/15  Yes Leandrew Koyanagi, MD   History   Social History  . Marital Status: Single    Spouse Name: N/A  . Number of Children: 0  . 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 Smoking status: Never Smoker   . Smokeless tobacco: Never Used  . Alcohol Use: 1.8 oz/week    3 Standard  drinks or equivalent per week  . Drug Use: No  . Sexual Activity: Yes   Other Topics Concern  . Not on file   Social History Narrative       Allergies  Allergen Reactions  . Desloratadine Other (See Comments)    CLARINEX-"severe headache"  . Hydrocod Polst-Cpm Polst Er Itching  . Loratadine Other (See Comments)    "severe headache"  . Telithromycin   . Tussionex Pennkinetic Er [Hydrocod Polst-Cpm Polst Er] Itching  . Levbid [Hyoscyamine Sulfate] Rash    Review of Systems  Constitutional: Negative for fever and chills.  HENT: Positive for congestion and postnasal drip.   Respiratory: Positive for cough and wheezing.   Cardiovascular: Negative for chest pain.  Gastrointestinal: Negative for nausea and vomiting.  Musculoskeletal: Negative for back pain, gait problem, neck pain and neck stiffness.       Objective:   Physical Exam  Constitutional: He is oriented to person, place, and time. He appears well-developed and well-nourished. No distress.  HENT:  Head: Normocephalic and atraumatic.  Eyes: Conjunctivae and EOM are normal.  Pupils are equal, round, and reactive to light.  Neck: Neck supple.  Cardiovascular: Normal rate.   Pulmonary/Chest: Effort normal. No respiratory distress. He has wheezes.  Mainly with forced expiration  Genitourinary:  A small inspissation hair follicle nodule on the ventral's penile shaft  Musculoskeletal: Normal range of motion.  Neurological: He is alert and oriented to person, place, and time.  Skin: Skin is warm and dry.  Psychiatric: He has a normal mood and affect. His behavior is normal.  Nursing note and vitals reviewed.  Filed Vitals:   04/25/15 1339  BP: 106/74  Pulse: 101  Temp: 98.2 F (36.8 C)  TempSrc: Oral  Resp: 16  Height: 5' 10.5" (1.791 m)  Weight: 237 lb 6.4 oz (107.684 kg)  SpO2: 98%       Assessment & Plan:  I have completed the patient encounter in its entirety as documented by the scribe, with editing by me where necessary. Fawn Desrocher P. Laney Pastor, M.D.  Acute recurrent maxillary sinusitis - Plan: amoxicillin-clavulanate (AUGMENTIN) 875-125 MG per tablet  Chronic migraine without aura without status migrainosus, not intractable - Plan: ketoprofen (ORUDIS) 75 MG capsule  Spasm of back muscles - Plan: cyclobenzaprine (FLEXERIL) 10 MG tablet  Allergic rhinitis, unspecified allergic rhinitis type - Plan: ipratropium (ATROVENT) 0.06 % nasal spray, ipratropium (ATROVENT HFA) 17 MCG/ACT inhaler  RAD (reactive airway disease), mild intermittent, uncomplicated  Gastroesophageal reflux disease with esophagitis  GAD (generalized anxiety disorder)  Meds ordered this encounter  Medications  . amLODipine (NORVASC) 10 MG tablet    Sig:   . ALPRAZolam (XANAX) 1 MG tablet    Sig: Take 1 tablet (1 mg total) by mouth 3 (three) times daily as needed.    Dispense:  90 tablet    Refill:  5  . clidinium-chlordiazePOXIDE (LIBRAX) 5-2.5 MG per capsule    Sig: Take 1 capsule by mouth 2 (two) times daily.    Dispense:  60 capsule    Refill:  5  . esomeprazole  (NEXIUM) 40 MG capsule    Sig: Take 1 capsule (40 mg total) by mouth daily before breakfast.    Dispense:  30 capsule    Refill:  11  . fluticasone (FLONASE) 50 MCG/ACT nasal spray    Sig: INSTILL 2 SPRAYS IN EACH NOSTRIL DAILY    Dispense:  16 g    Refill:  10  . hydrocortisone (ANUSOL-HC) 2.5 %  rectal cream    Sig: Place rectally 2 (two) times daily.    Dispense:  30 g    Refill:  3  . sildenafil (VIAGRA) 100 MG tablet    Sig: Take 1/2 to 1 tablet by mouth up to 12 hr before needed    Dispense:  5 tablet    Refill:  11  . rizatriptan (MAXALT-MLT) 10 MG disintegrating tablet    Sig: TAKE AS DIRECTED    Dispense:  9 tablet    Refill:  11  . montelukast (SINGULAIR) 10 MG tablet    Sig: Take 1 tablet (10 mg total) by mouth at bedtime.    Dispense:  30 tablet    Refill:  11  . ketoprofen (ORUDIS) 75 MG capsule    Sig: Take 1 capsule (75 mg total) by mouth 4 (four) times daily as needed.    Dispense:  30 capsule    Refill:  5  . cyclobenzaprine (FLEXERIL) 10 MG tablet    Sig: Take 1 tablet (10 mg total) by mouth 3 (three) times daily as needed for muscle spasms. May choose to use only at bedtime due to sleepiness.    Dispense:  30 tablet    Refill:  5  . ipratropium (ATROVENT) 0.06 % nasal spray    Sig: Place 2 sprays into the nose 2 (two) times daily.    Dispense:  15 mL    Refill:  9  . ipratropium (ATROVENT HFA) 17 MCG/ACT inhaler    Sig: INHALE 2 PUFFS INTO THE LUNGS TWICE DAILY OR AS DIRECTED    Dispense:  12.9 g    Refill:  11  . predniSONE (DELTASONE) 20 MG tablet    Sig: 3/3/2/2/1/1 single daily dose for 6 days    Dispense:  12 tablet    Refill:  0  . amoxicillin-clavulanate (AUGMENTIN) 875-125 MG per tablet    Sig: Take 1 tablet by mouth 2 (two) times daily.    Dispense:  20 tablet    Refill:  0   ? Dr Margretta Ditty in [place]

## 2015-07-13 ENCOUNTER — Encounter: Payer: Self-pay | Admitting: Cardiovascular Disease

## 2015-07-13 ENCOUNTER — Telehealth: Payer: Self-pay | Admitting: *Deleted

## 2015-07-13 DIAGNOSIS — I10 Essential (primary) hypertension: Secondary | ICD-10-CM

## 2015-07-13 MED ORDER — VALSARTAN 80 MG PO TABS: 80.0000 mg | ORAL_TABLET | Freq: Every day | ORAL | Status: DC

## 2015-07-13 NOTE — Telephone Encounter (Signed)
Spoke with pt regarding my chart message to confirm his pharmacy.  Will send prescription for Diovan to [facility] on [address] in [place].  Pt will come in for BMP on 08/05/15.  I asked pt to continue to monitor blood pressure and let us know if it does not improve.  Also told him he could bring readings to lab appt and we would send them to [name] for review.

## 2015-07-25 ENCOUNTER — Telehealth: Payer: Self-pay

## 2015-07-25 NOTE — Telephone Encounter (Signed)
Pt would like referral to Dr. Ok Edwards regarding his ankle. He has [employer].

## 2015-07-25 NOTE — Telephone Encounter (Signed)
Pt states he went to see Dr Ok Edwards regarding his ankle but now need a referral since he have [place]. Please call [phone]

## 2015-07-26 ENCOUNTER — Encounter: Payer: Self-pay | Admitting: Internal Medicine

## 2015-07-26 DIAGNOSIS — S99919A Unspecified injury of unspecified ankle, initial encounter: Secondary | ICD-10-CM

## 2015-07-27 ENCOUNTER — Other Ambulatory Visit: Payer: Self-pay | Admitting: Internal Medicine

## 2015-07-27 NOTE — Telephone Encounter (Signed)
In order to complete the [facility] referral, I need a diagnosis code from a provider.  Please advise, thank you.

## 2015-07-28 NOTE — Telephone Encounter (Signed)
Called in.

## 2015-07-28 NOTE — Telephone Encounter (Signed)
Encounter Diagnoses and Associated Orders     Ankle injury, unspecified laterality, initial encounter [[id number]] - Primary    Ambulatory referral to [place]

## 2015-08-05 ENCOUNTER — Other Ambulatory Visit (INDEPENDENT_AMBULATORY_CARE_PROVIDER_SITE_OTHER): Payer: 59 | Admitting: *Deleted

## 2015-08-05 DIAGNOSIS — I10 Essential (primary) hypertension: Secondary | ICD-10-CM

## 2015-08-05 LAB — BASIC METABOLIC PANEL
BUN: 11 mg/dL (ref 6–23)
CO2: 31 meq/L (ref 19–32)
Calcium: 9.1 mg/dL (ref 8.4–10.5)
Chloride: 99 mEq/L (ref 96–112)
Creatinine, Ser: 1.07 mg/dL (ref 0.40–1.50)
GFR: 78.94 mL/min (ref >60.00)
GLUCOSE: 96 mg/dL (ref 70–99)
POTASSIUM: 3.6 meq/L (ref 3.5–5.1)
SODIUM: 138 meq/L (ref 135–145)

## 2015-08-24 ENCOUNTER — Other Ambulatory Visit: Payer: Self-pay | Admitting: Internal Medicine

## 2015-08-26 NOTE — Telephone Encounter (Signed)
Faxed

## 2015-09-26 ENCOUNTER — Other Ambulatory Visit: Payer: Self-pay | Admitting: Internal Medicine

## 2015-09-27 ENCOUNTER — Telehealth: Payer: Self-pay | Admitting: Internal Medicine

## 2015-09-27 ENCOUNTER — Other Ambulatory Visit: Payer: Self-pay | Admitting: Internal Medicine

## 2015-09-28 MED ORDER — ZOLPIDEM TARTRATE 10 MG PO TABS: 10.0000 mg | ORAL_TABLET | Freq: Every day | ORAL | Status: DC

## 2015-09-28 NOTE — Telephone Encounter (Signed)
Faxed

## 2015-09-28 NOTE — Telephone Encounter (Signed)
Meds ordered this encounter  Medications  . zolpidem (AMBIEN) 10 MG tablet    Sig: Take 1 tablet (10 mg total) by mouth at bedtime.    Dispense:  30 tablet    Refill:  5

## 2015-09-30 NOTE — Telephone Encounter (Signed)
Rx called in 

## 2015-10-03 ENCOUNTER — Encounter: Payer: Self-pay | Admitting: Internal Medicine

## 2015-10-25 ENCOUNTER — Other Ambulatory Visit: Payer: Self-pay | Admitting: Internal Medicine

## 2015-11-04 ENCOUNTER — Other Ambulatory Visit: Payer: Self-pay | Admitting: Internal Medicine

## 2015-11-07 NOTE — Telephone Encounter (Signed)
Rx faxed

## 2015-11-16 ENCOUNTER — Other Ambulatory Visit: Payer: Self-pay | Admitting: Cardiovascular Disease

## 2015-11-18 ENCOUNTER — Ambulatory Visit (INDEPENDENT_AMBULATORY_CARE_PROVIDER_SITE_OTHER): Payer: BLUE CROSS/BLUE SHIELD | Admitting: Internal Medicine

## 2015-11-18 VITALS — BP 120/80 | HR 98 | Temp 98.3°F | Resp 20 | Ht 71.0 in | Wt 251.8 lb

## 2015-11-18 DIAGNOSIS — Z209 Contact with and (suspected) exposure to unspecified communicable disease: Secondary | ICD-10-CM | POA: Diagnosis not present

## 2015-11-18 DIAGNOSIS — F411 Generalized anxiety disorder: Secondary | ICD-10-CM | POA: Diagnosis not present

## 2015-11-18 DIAGNOSIS — G43709 Chronic migraine without aura, not intractable, without status migrainosus: Secondary | ICD-10-CM

## 2015-11-18 DIAGNOSIS — G4700 Insomnia, unspecified: Secondary | ICD-10-CM

## 2015-11-18 DIAGNOSIS — I10 Essential (primary) hypertension: Secondary | ICD-10-CM

## 2015-11-18 DIAGNOSIS — Z6833 Body mass index (BMI) 33.0-33.9, adult: Secondary | ICD-10-CM | POA: Diagnosis not present

## 2015-11-18 DIAGNOSIS — E291 Testicular hypofunction: Secondary | ICD-10-CM

## 2015-11-18 DIAGNOSIS — J309 Allergic rhinitis, unspecified: Secondary | ICD-10-CM | POA: Diagnosis not present

## 2015-11-18 DIAGNOSIS — J4520 Mild intermittent asthma, uncomplicated: Secondary | ICD-10-CM

## 2015-11-18 LAB — COMPREHENSIVE METABOLIC PANEL
ALK PHOS: 65 U/L (ref 40–115)
ALT: 19 U/L (ref 9–46)
AST: 17 U/L (ref 10–40)
Albumin: 4.8 g/dL (ref 3.6–5.1)
BILIRUBIN TOTAL: 0.8 mg/dL (ref 0.2–1.2)
BUN: 14 mg/dL (ref 7–25)
CALCIUM: 10.2 mg/dL (ref 8.6–10.3)
CO2: 26 mmol/L (ref 20–31)
CREATININE: 0.99 mg/dL (ref 0.60–1.35)
Chloride: 98 mmol/L (ref 98–110)
GLUCOSE: 87 mg/dL (ref 65–99)
Potassium: 4 mmol/L (ref 3.5–5.3)
SODIUM: 139 mmol/L (ref 135–146)
Total Protein: 7.1 g/dL (ref 6.1–8.1)

## 2015-11-18 LAB — CBC WITH DIFFERENTIAL/PLATELET
BASOS ABS: 0 10*3/uL (ref 0.0–0.1)
BASOS PCT: 0 % (ref 0–1)
EOS PCT: 0 % (ref 0–5)
Eosinophils Absolute: 0 10*3/uL (ref 0.0–0.7)
HEMATOCRIT: 52.2 % — AB (ref 39.0–52.0)
Hemoglobin: 18.7 g/dL — ABNORMAL HIGH (ref 13.0–17.0)
LYMPHS PCT: 29 % (ref 12–46)
Lymphs Abs: 2.7 10*3/uL (ref 0.7–4.0)
MCH: 30.1 pg (ref 26.0–34.0)
MCHC: 35.8 g/dL (ref 30.0–36.0)
MCV: 83.9 fL (ref 78.0–100.0)
MONO ABS: 0.7 10*3/uL (ref 0.1–1.0)
MPV: 9.2 fL (ref 8.6–12.4)
Monocytes Relative: 8 % (ref 3–12)
Neutro Abs: 5.8 10*3/uL (ref 1.7–7.7)
Neutrophils Relative %: 63 % (ref 43–77)
PLATELETS: 212 10*3/uL (ref 150–400)
RBC: 6.22 MIL/uL — AB (ref 4.22–5.81)
RDW: 13.8 % (ref 11.5–15.5)
WBC: 9.2 10*3/uL (ref 4.0–10.5)

## 2015-11-18 LAB — LIPID PANEL
Cholesterol: 156 mg/dL (ref 125–200)
HDL: 31 mg/dL — AB (ref >=40)
LDL CALC: 90 mg/dL (ref <130)
Total CHOL/HDL Ratio: 5 Ratio (ref <=5.0)
Triglycerides: 176 mg/dL — ABNORMAL HIGH (ref <150)
VLDL: 35 mg/dL — ABNORMAL HIGH (ref <30)

## 2015-11-18 LAB — TSH: TSH: 1.502 u[IU]/mL (ref 0.350–4.500)

## 2015-11-18 LAB — POCT GLYCOSYLATED HEMOGLOBIN (HGB A1C): HEMOGLOBIN A1C: 4.7

## 2015-11-18 MED ORDER — ALPRAZOLAM 1 MG PO TABS: 1.0000 mg | ORAL_TABLET | Freq: Three times a day (TID) | ORAL | Status: DC | PRN

## 2015-11-18 MED ORDER — IPRATROPIUM BROMIDE 0.06 % NA SOLN: 2.0000 | Freq: Two times a day (BID) | NASAL | Status: DC

## 2015-11-18 MED ORDER — ALBUTEROL SULFATE HFA 108 (90 BASE) MCG/ACT IN AERS: 2.0000 | INHALATION_SPRAY | Freq: Four times a day (QID) | RESPIRATORY_TRACT | Status: DC | PRN

## 2015-11-18 MED ORDER — FLUTICASONE-SALMETEROL 250-50 MCG/DOSE IN AEPB: 1.0000 | INHALATION_SPRAY | Freq: Two times a day (BID) | RESPIRATORY_TRACT | Status: DC

## 2015-11-18 MED ORDER — CYCLOBENZAPRINE HCL 10 MG PO TABS: 10.0000 mg | ORAL_TABLET | Freq: Three times a day (TID) | ORAL | Status: DC | PRN

## 2015-11-18 MED ORDER — IPRATROPIUM BROMIDE HFA 17 MCG/ACT IN AERS: INHALATION_SPRAY | RESPIRATORY_TRACT | Status: DC

## 2015-11-18 MED ORDER — ZOLPIDEM TARTRATE 10 MG PO TABS: 10.0000 mg | ORAL_TABLET | Freq: Every day | ORAL | Status: DC

## 2015-11-18 MED ORDER — MELOXICAM 15 MG PO TABS: 15.0000 mg | ORAL_TABLET | Freq: Every day | ORAL | Status: DC

## 2015-11-18 MED ORDER — KETOPROFEN 75 MG PO CAPS: 75.0000 mg | ORAL_CAPSULE | Freq: Four times a day (QID) | ORAL | Status: DC | PRN

## 2015-11-18 MED ORDER — SILDENAFIL CITRATE 100 MG PO TABS: ORAL_TABLET | ORAL | Status: DC

## 2015-11-18 MED ORDER — RIZATRIPTAN BENZOATE 10 MG PO TBDP: ORAL_TABLET | ORAL | Status: DC

## 2015-11-18 MED ORDER — MONTELUKAST SODIUM 10 MG PO TABS: 10.0000 mg | ORAL_TABLET | Freq: Every day | ORAL | Status: DC

## 2015-11-18 MED ORDER — CILIDINIUM-CHLORDIAZEPOXIDE 2.5-5 MG PO CAPS: 1.0000 | ORAL_CAPSULE | Freq: Two times a day (BID) | ORAL | Status: DC

## 2015-11-18 NOTE — Progress Notes (Signed)
Subjective:    Patient ID: Randall Reed, male    DOB: 09/04/1971, 45 y.o.   MRN: [id number] By signing my name below, I, Judithe Modest, attest that this documentation has been prepared under the direction and in the presence of [name], MD. Electronically Signed: Judithe Modest, ER Scribe. 11/18/2015. 4:56 PM.  Chief Complaint  Patient presents with  . Medication Refill    xanax, librax, flexeril,flonase,   . Abdominal Pain  . Cough    HPI  HPI Comments: Randall Reed is a 45 y.o. male who presents to [facility] complaining of severe lower right sided abdominal pain that occurs with movement. Started 1-2 w ago with a twisting movement in the shower. The pain is not affected by eating. He can feel ?new masses in his right abdomen and worries about hernias. He has been diagnosed with a benign abdominal mass in the past.   He is also having a mild cough w PND-clear-no fever.  He also needs medication refills.   Patient Active Problem List   Diagnosis Date Noted  . 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 BMI 33.0-33.9,adult---now back up to 35 following inactivity after ankle injury 02/04/2012  . Allergic rhinitis with RAD frequent 02/04/2012  . GERD (gastroesophageal reflux disease)---otc now 02/04/2012  . Migraine--infreq/resp to meds 02/04/2012  . GAD (generalized anxiety disorder)--better but still needs meds 02/04/2012  . Insomnia--resp to meds 02/04/2012  . HYPERTENSION, BENIGN 04/13/2009   Now working/living in [place] but home frequently to help with Mom and an aunt with recent stroke  Would like usual std screen which he pursues every 6 months  Review of Systems  Constitutional: Negative for fever and chills.  Eyes: Negative for visual disturbance.  Respiratory: Negative for chest tightness, shortness of breath and wheezing.   Cardiovascular: Negative for chest pain, palpitations and leg swelling.    Gastrointestinal: Negative for nausea, vomiting, diarrhea, constipation and blood in stool.  Genitourinary: Negative for difficulty urinating.  Musculoskeletal: Negative for back pain.  Neurological: Negative for tremors, weakness, light-headedness and numbness.  Hematological: Does not bruise/bleed easily.      Objective:  BP 120/80 mmHg  Pulse 98  Temp(Src) 98.3 F (36.8 C) (Oral)  Resp 20  Ht 5\' 11"  (1.803 m)  Wt 251 lb 12.8 oz (114.216 kg)  BMI 35.13 kg/m2  SpO2 97%  Physical Exam  Constitutional: He is oriented to person, place, and time. He appears well-developed and well-nourished. No distress.  HENT:  Head: Normocephalic and atraumatic.  Right Ear: External ear normal.  Left Ear: External ear normal.  Nose: Nose normal.  Mouth/Throat: Oropharynx is clear and moist.  Eyes: Conjunctivae are normal. Pupils are equal, round, and reactive to light.  Neck: Neck supple. No thyromegaly present.  Cardiovascular: Normal rate, regular rhythm and normal heart sounds.   No murmur heard. Pulmonary/Chest: Effort normal and breath sounds normal. No respiratory distress. He has no wheezes.  Abdominal: He exhibits no distension. There is no rebound and no guarding.  There are four small nodules in the RUQ without muscle defect or underlying organomegaly. Slighly tender in the RUQ no evidence of herniation.   Musculoskeletal: Normal range of motion. He exhibits no edema.  Thoracic and lumbar spine normal.  Lymphadenopathy:    He has no cervical adenopathy.  Neurological: He is alert and oriented to person, place, and time. Coordination normal.  Skin: Skin is warm and dry. He is not diaphoretic.  Psychiatric: He has a normal mood and affect. His behavior is normal.  Nursing note and vitals reviewed.     Assessment & Plan:  HYPERTENSION, BENIGN - HCT/norvasc/valsartan--followed by Dr Teola Bradley hx CP and Palp)  BMI 35,adult - Plan: TSH, POCT glycosylated hemoglobin (Hb A1C)  GAD  (generalized anxiety disorder) w/ IBS--alpraz/librax  Insomnia-ambien  Hypogonadism male - Plan: Testosterone, Free, Total,--labs to Dr Buddy Duty  Exposure to communicable disease - Plan: RPR, HIV antibody, GC/Chlamydia Probe Amp, CANCELED: HIV antibody  Allergic rhinitis, unspecified allergic rhinitis type - Plan:  ipratropium (ATROVENT) 0.06 % nasal spray/singulair  Chronic migraine without aura without status migrainosus, not intractable - Plan: ketoprofen (ORUDIS) 75 MG capsule/flexeril/maxalt  Asthma--contin inhalers albut/atro/adv  Meds ordered this encounter  Medications  . ipratropium (ATROVENT HFA) 17 MCG/ACT inhaler    Sig: INHALE 2 PUFFS INTO THE LUNGS TWICE DAILY OR AS DIRECTED    Dispense:  12.9 g    Refill:  11  . ipratropium (ATROVENT) 0.06 % nasal spray    Sig: Place 2 sprays into the nose 2 (two) times daily.    Dispense:  15 mL    Refill:  9  . ketoprofen (ORUDIS) 75 MG capsule    Sig: Take 1 capsule (75 mg total) by mouth 4 (four) times daily as needed.    Dispense:  30 capsule    Refill:  5  . albuterol (PROVENTIL HFA;VENTOLIN HFA) 108 (90 Base) MCG/ACT inhaler    Sig: Inhale 2 puffs into the lungs every 6 (six) hours as needed.    Dispense:  1 Inhaler    Refill:  5  . clidinium-chlordiazePOXIDE (LIBRAX) 5-2.5 MG capsule    Sig: Take 1 capsule by mouth 2 (two) times daily.    Dispense:  60 capsule    Refill:  5  . ALPRAZolam (XANAX) 1 MG tablet    Sig: Take 1 tablet (1 mg total) by mouth 3 (three) times daily as needed.    Dispense:  90 tablet    Refill:  5  . cyclobenzaprine (FLEXERIL) 10 MG tablet    Sig: Take 1 tablet (10 mg total) by mouth 3 (three) times daily as needed. May choose to use only at night due to sleepiness.    Dispense:  90 tablet    Refill:  5  . Fluticasone-Salmeterol (ADVAIR DISKUS) 250-50 MCG/DOSE AEPB    Sig: Inhale 1 puff into the lungs 2 (two) times daily.    Dispense:  1 each    Refill:  11  . meloxicam (MOBIC) 15 MG tablet     Sig: Take 1 tablet (15 mg total) by mouth daily.    Dispense:  30 tablet    Refill:  0  . montelukast (SINGULAIR) 10 MG tablet    Sig: Take 1 tablet (10 mg total) by mouth at bedtime.    Dispense:  30 tablet    Refill:  11  . rizatriptan (MAXALT-MLT) 10 MG disintegrating tablet    Sig: TAKE AS DIRECTED    Dispense:  9 tablet    Refill:  11  . sildenafil (VIAGRA) 100 MG tablet    Sig: Take 1/2 to 1 tablet by mouth up to 12 hr before needed    Dispense:  5 tablet    Refill:  11  . zolpidem (AMBIEN) 10 MG tablet    Sig: Take 1 tablet (10 mg total) by mouth at bedtime.    Dispense:  30 tablet    Refill:  5  Fu 6 mos I have completed the patient encounter in its entirety as documented by the scribe, with editing by me where necessary. Carnelius Hammitt P. Laney Pastor, M.D.

## 2015-11-19 DIAGNOSIS — G4730 Sleep apnea, unspecified: Secondary | ICD-10-CM | POA: Insufficient documentation

## 2015-11-19 DIAGNOSIS — J45909 Unspecified asthma, uncomplicated: Secondary | ICD-10-CM | POA: Insufficient documentation

## 2015-11-19 LAB — RPR

## 2015-11-19 LAB — GC/CHLAMYDIA PROBE AMP
CT PROBE, AMP APTIMA: NOT DETECTED
GC Probe RNA: NOT DETECTED

## 2015-11-19 LAB — HIV ANTIBODY (ROUTINE TESTING W REFLEX): HIV: NONREACTIVE

## 2015-11-21 LAB — TESTOSTERONE, FREE, TOTAL, SHBG
Sex Hormone Binding: 10 nmol/L (ref 10–50)
TESTOSTERONE-% FREE: 3.2 % — AB (ref 1.6–2.9)
TESTOSTERONE: 251 ng/dL (ref 250–827)
Testosterone, Free: 80.2 pg/mL (ref 47.0–244.0)

## 2015-11-25 ENCOUNTER — Telehealth: Payer: Self-pay

## 2015-11-25 NOTE — Telephone Encounter (Signed)
PA completed on covermymeds for atrovent inhaler. Pending.

## 2015-12-05 NOTE — Telephone Encounter (Signed)
PA denied because pt has not tried/failed Spiriva. Dr Laney Pastor, I see your note to me that we'll have to give in to this. Does this mean that you want to Rx Spiriva? Not sure what strength/dose you want to Rx.

## 2015-12-05 NOTE — Telephone Encounter (Signed)
Dx is asthma so he should have to stick to inhaled steroids and albuterol which i think he has

## 2015-12-06 NOTE — Telephone Encounter (Signed)
Notified pharm. 

## 2015-12-21 ENCOUNTER — Other Ambulatory Visit: Payer: Self-pay | Admitting: Internal Medicine

## 2015-12-28 ENCOUNTER — Telehealth: Payer: Self-pay | Admitting: Nurse Practitioner

## 2015-12-28 DIAGNOSIS — R059 Cough, unspecified: Secondary | ICD-10-CM

## 2015-12-28 DIAGNOSIS — R05 Cough: Secondary | ICD-10-CM

## 2015-12-28 MED ORDER — BENZONATATE 100 MG PO CAPS: 100.0000 mg | ORAL_CAPSULE | Freq: Three times a day (TID) | ORAL | Status: DC | PRN

## 2015-12-28 MED ORDER — LEVOFLOXACIN 500 MG PO TABS: 500.0000 mg | ORAL_TABLET | Freq: Every day | ORAL | Status: DC

## 2015-12-28 NOTE — Progress Notes (Signed)
We are sorry that you are not feeling well.  Here is how we plan to help!  Based on what you have shared with me it looks like you have upper respiratory tract inflammation that has resulted in a significant cough.  Inflammation and infection in the upper respiratory tract is commonly called bronchitis and has four common causes:  Allergies, Viral Infections, Acid Reflux and Bacterial Infections.  Allergies, viruses and acid reflux are treated by controlling symptoms or eliminating the cause. An example might be a cough caused by taking certain blood pressure medications. You stop the cough by changing the medication. Another example might be a cough caused by acid reflux. Controlling the reflux helps control the cough.  Based on your presentation I believe you most likely have A cough due to bacteria.  When patients have a fever and a productive cough with a change in color or increased sputum production, we are concerned about bacterial bronchitis.  If left untreated it can progress to pneumonia.  If your symptoms do not improve with your treatment plan it is important that you contact your provider.   I have prescribed Azithromyin 250 mg: two tables now and then one tablet daily for 4 additonal days   In addition you may use A prescription cough medication called Tessalon Perles 100mg . You may take 1-2 capsules every 8 hours as needed for your cough. sORRY BUT CANNOT DO TUSSIONEX OR ANY CONTROLLED MEDS ON E VISIT- ALSO YOUR CHART SAYS YOU ARE ALLERGIC TO [place] SO YOU MAY WANT TO CHECK INTO THAT.    HOME CARE . Only take medications as instructed by your medical team. . Complete the entire course of an antibiotic. . Drink plenty of fluids and get plenty of rest. . Avoid close contacts especially the very young and the elderly . Cover your mouth if you cough or cough into your sleeve. . Always remember to wash your hands . A steam or ultrasonic humidifier can help congestion.    GET HELP RIGHT  AWAY IF: . You develop worsening fever. . You become short of breath . You cough up blood. . Your symptoms persist after you have completed your treatment plan MAKE SURE YOU   Understand these instructions.  Will watch your condition.  Will get help right away if you are not doing well or get worse.  Your e-visit answers were reviewed by a board certified advanced clinical practitioner to complete your personal care plan.  Depending on the condition, your plan could have included both over the counter or prescription medications. If there is a problem please reply  once you have received a response from your provider. Your safety is important to [place].  If you have drug allergies check your prescription carefully.    You can use MyChart to ask questions about today's visit, request a non-urgent call back, or ask for a work or school excuse for 24 hours related to this e-Visit. If it has been greater than 24 hours you will need to follow up with your provider, or enter a new e-Visit to address those concerns. You will get an e-mail in the next two days asking about your experience.  I hope that your e-visit has been valuable and will speed your recovery. Thank you for using e-visits.

## 2016-01-13 ENCOUNTER — Encounter: Payer: Self-pay | Admitting: Cardiovascular Disease

## 2016-01-13 ENCOUNTER — Ambulatory Visit (INDEPENDENT_AMBULATORY_CARE_PROVIDER_SITE_OTHER): Payer: BLUE CROSS/BLUE SHIELD | Admitting: Cardiovascular Disease

## 2016-01-13 VITALS — BP 110/80 | HR 88 | Ht 71.0 in | Wt 243.8 lb

## 2016-01-13 DIAGNOSIS — R072 Precordial pain: Secondary | ICD-10-CM

## 2016-01-13 DIAGNOSIS — I10 Essential (primary) hypertension: Secondary | ICD-10-CM

## 2016-01-13 MED ORDER — TADALAFIL 20 MG PO TABS: 20.0000 mg | ORAL_TABLET | Freq: Every day | ORAL | Status: DC | PRN

## 2016-01-13 NOTE — Patient Instructions (Addendum)
Medication Instructions:  Your physician has recommended you make the following change in your medication:  1. STOP Viagra 2. START Cialis 20mg  take as directed prior to sexual activity  Labwork: No new orders.   Testing/Procedures: No new orders.   Follow-Up: Your physician wants you to follow-up in: December 2017 with Dr Burt Knack.  You will receive a reminder letter in the mail two months in advance. If you don't receive a letter, please call our office to schedule the follow-up appointment.   Any Other Special Instructions Will Be Listed Below (If Applicable).     If you need a refill on your cardiac medications before your next appointment, please call your pharmacy.

## 2016-01-13 NOTE — Progress Notes (Signed)
Cardiology Office Note Date:  01/13/2016   ID:  Randall Reed, DOB 1971-06-05, MRN [id number]  PCP:  Leandrew Koyanagi, MD  Cardiologist:  Sherren Mocha, MD    Chief Complaint  Patient presents with  . Scheduled follow up    Hypertension. C/O chest pain off and on. Denies shortness of breath, lee, or claudication    History of Present Illness: Randall Reed is a 45 y.o. male who presents for cardiology follow-up. The patient is been evaluated in the past for chest pain and tachy-palpitations. In 2013 he underwent a cardiac CTA demonstrating no coronary artery disease. In 2015 he underwent an exercise treadmill study which showed no evidence of ischemia with good exercise tolerance at 12 minutes on the Bruce protocol. When he was seen last year his blood pressure was elevated and antihypertensive medications were increased.  He complains of left-sided chest tightness during times of stress. Also has a sharp pain. Not symptomatic with physical exertion.   He's continued to have problems with his weight. Complains of fatigue. Also with weight gain despite no changes in diet - following with Dr Buddy Duty - treated with testosterone.   Denies exertional dyspnea. Has mild leg swelling. No orthopnea, PND, or other complaints.   Past Medical History  Diagnosis Date  . GERD (gastroesophageal reflux disease)   . Anxiety   . Seasonal allergic reaction   . Depression   . Arthritis   . Asthma     Past Surgical History  Procedure Laterality Date  . None    . None      Current Outpatient Prescriptions  Medication Sig Dispense Refill  . acetic acid-hydrocortisone (VOSOL-HC) otic solution Place 3 drops into both ears 3 (three) times daily. Use as needed to prevent swimmer's ear 10 mL 5  . albuterol (PROVENTIL HFA;VENTOLIN HFA) 108 (90 Base) MCG/ACT inhaler Inhale 2 puffs into the lungs every 6 (six) hours as needed for wheezing or shortness of breath.    . ALPRAZolam (XANAX) 1 MG tablet Take  1 mg by mouth 3 (three) times daily as needed for anxiety.    Marland Kitchen amLODipine (NORVASC) 10 MG tablet TAKE 1 TABLET(10 MG) BY MOUTH DAILY 30 tablet 3  . aspirin EC 81 MG tablet Take 1 tablet (81 mg total) by mouth daily.    . benzonatate (TESSALON PERLES) 100 MG capsule Take 1 capsule (100 mg total) by mouth 3 (three) times daily as needed for cough. 20 capsule 0  . cyclobenzaprine (FLEXERIL) 10 MG tablet Take 10 mg by mouth 3 (three) times daily as needed for muscle spasms.    . diclofenac (VOLTAREN) 75 MG EC tablet Take 1 tablet (75 mg total) by mouth 2 (two) times daily. 60 tablet 5  . esomeprazole (NEXIUM) 40 MG capsule Take 1 capsule (40 mg total) by mouth daily before breakfast. 30 capsule 11  . Fexofenadine HCl (ALLEGRA PO) Take 1 tablet by mouth daily.     . fluticasone (FLONASE) 50 MCG/ACT nasal spray INSTILL 2 SPRAYS IN EACH NOSTRIL DAILY 16 g 10  . Fluticasone-Salmeterol (ADVAIR DISKUS) 250-50 MCG/DOSE AEPB Inhale 1 puff into the lungs 2 (two) times daily. 1 each 11  . hydrochlorothiazide (HYDRODIURIL) 25 MG tablet Take 1 tablet (25 mg total) by mouth daily. 90 tablet 3  . hydrocortisone (ANUSOL-HC) 2.5 % rectal cream Place 1 application rectally 2 (two) times daily.    Marland Kitchen ipratropium (ATROVENT HFA) 17 MCG/ACT inhaler INHALE 2 PUFFS INTO THE LUNGS TWICE DAILY OR AS  DIRECTED 12.9 g 11  . ipratropium (ATROVENT) 0.06 % nasal spray Place 2 sprays into the nose 2 (two) times daily. 15 mL 9  . ketoprofen (ORUDIS) 75 MG capsule Take 75 mg by mouth 4 (four) times daily as needed for mild pain.    Marland Kitchen levofloxacin (LEVAQUIN) 500 MG tablet Take 1 tablet (500 mg total) by mouth daily. 7 tablet 0  . montelukast (SINGULAIR) 10 MG tablet Take 1 tablet (10 mg total) by mouth at bedtime. 30 tablet 11  . rizatriptan (MAXALT-MLT) 10 MG disintegrating tablet TAKE AS DIRECTED 9 tablet 11  . sildenafil (VIAGRA) 100 MG tablet Take 1/2 to 1 tablet by mouth up to 12 hr before needed 5 tablet 11  . valsartan (DIOVAN)  80 MG tablet Take 1 tablet (80 mg total) by mouth daily. 30 tablet 6  . zolpidem (AMBIEN) 10 MG tablet Take 1 tablet (10 mg total) by mouth at bedtime. 30 tablet 5   No current facility-administered medications for this visit.    Allergies:   Desloratadine; Hydrocod polst-cpm polst er; Loratadine; Tussionex pennkinetic er; Engineer, water; and Telithromycin   Social History:  The patient  reports that he has never smoked. He has never used smokeless tobacco. He reports that he drinks about 1.8 oz of alcohol per week. He reports that he does not use illicit drugs.   Family History:  The patient's family history includes Coronary artery disease in his paternal grandfather; Coronary artery disease (age of onset: 49) in his father; Heart attack in his father and maternal grandfather.    ROS:  Please see the history of present illness.  Otherwise, review of systems is positive for Chest pain, weight gain, fatigue, erectile dysfunction.  All other systems are reviewed and negative.    PHYSICAL EXAM: VS:  BP 110/80 mmHg  Pulse 88  Ht 5\' 11"  (1.803 m)  Wt 243 lb 12.8 oz (110.587 kg)  BMI 34.02 kg/m2 , BMI Body mass index is 34.02 kg/(m^2). GEN: Well nourished, well developed, overweight male in no acute distress HEENT: normal Neck: no JVD, no masses. No carotid bruits Cardiac: RRR without murmur or gallop                Respiratory:  clear to auscultation bilaterally, normal work of breathing GI: soft, nontender, nondistended, + BS MS: no deformity or atrophy Ext: no pretibial edema, pedal pulses 2+= bilaterally Skin: warm and dry, no rash Neuro:  Strength and sensation are intact Psych: euthymic mood, full affect  EKG:  EKG is ordered today. The ekg ordered today shows normal sinus rhythm 88 bpm, within normal limits.  Recent Labs: 11/18/2015: ALT 19; BUN 14; Creat 0.99; Hemoglobin 18.7*; Platelets 212; Potassium 4.0; Sodium 139; TSH 1.502   Lipid Panel     Component Value Date/Time   CHOL  156 11/18/2015 1657   TRIG 176* 11/18/2015 1657   HDL 31* 11/18/2015 1657   CHOLHDL 5.0 11/18/2015 1657   VLDL 35* 11/18/2015 1657   LDLCALC 90 11/18/2015 1657      Wt Readings from Last 3 Encounters:  01/13/16 243 lb 12.8 oz (110.587 kg)  11/18/15 251 lb 12.8 oz (114.216 kg)  04/25/15 237 lb 6.4 oz (107.684 kg)     Cardiac Studies Reviewed: 2-D echocardiogram 02/21/2012: Study Conclusions  - Left ventricle: The cavity size was normal. Wall thickness was increased in a pattern of mild LVH. The estimated ejection fraction was 60%. Wall motion was normal; there were no regional wall motion  abnormalities. - Left atrium: The atrium was mildly dilated. - Right ventricle: The cavity size was normal. Systolic function was mildly reduced.  ASSESSMENT AND PLAN: 1.  Essential hypertension: Blood pressure now well controlled on a combination of amlodipine, hydrochlorothiazide, and valsartan  2. Tachypalpitations: Resolved  3. Chest pain: Typical and atypical features. No change in symptoms since his stress test done just over one year ago. No further testing indicated at this time. It is reassuring that he can exercise vigorously with no cardiopulmonary symptoms.  4. Erectile dysfunction: Cialis 20 mg written for the patient. He will stop taking Viagra because of side effects with this.   Current medicines are reviewed with the patient today.  The patient does not have concerns regarding medicines.  Labs/ tests ordered today include:  No orders of the defined types were placed in this encounter.    Disposition:   FU one year  Signed, Sherren Mocha, MD  01/13/2016 8:33 AM    [facility] [address], [place], [place]  [address] Phone: [phone]; Fax: [phone]

## 2016-01-21 ENCOUNTER — Other Ambulatory Visit: Payer: Self-pay | Admitting: Cardiovascular Disease

## 2016-01-24 ENCOUNTER — Telehealth: Payer: Self-pay | Admitting: Family Medicine

## 2016-01-24 NOTE — Telephone Encounter (Signed)
Patient received flu shot in October 2016

## 2016-02-04 ENCOUNTER — Ambulatory Visit (INDEPENDENT_AMBULATORY_CARE_PROVIDER_SITE_OTHER): Payer: BLUE CROSS/BLUE SHIELD | Admitting: Physician Assistant

## 2016-02-04 VITALS — BP 106/64 | HR 104 | Temp 98.0°F | Resp 16 | Ht 71.0 in | Wt 247.0 lb

## 2016-02-04 DIAGNOSIS — J029 Acute pharyngitis, unspecified: Secondary | ICD-10-CM | POA: Diagnosis not present

## 2016-02-04 DIAGNOSIS — J069 Acute upper respiratory infection, unspecified: Secondary | ICD-10-CM

## 2016-02-04 DIAGNOSIS — J309 Allergic rhinitis, unspecified: Secondary | ICD-10-CM

## 2016-02-04 LAB — POCT RAPID STREP A (OFFICE): RAPID STREP A SCREEN: NEGATIVE

## 2016-02-04 NOTE — Patient Instructions (Addendum)
If prescribed mucinex, take twice a day with plenty of water (64 oz per day). Stop the atrovent nasal spray. Sub in afrin twice a day for max 3 days. Hot showers, breathing in steam from shower or pot of boiling onions, eating spicy food and neti pot with sterile water can all help your sinuses drain. I will send Dr. Laney Pastor a message     IF you received an x-ray today, you will receive an invoice from [facility]. Please contact [facility] at [phone] with questions or concerns regarding your invoice.   IF you received labwork today, you will receive an invoice from [employer]. Please contact [facility] at [phone] with questions or concerns regarding your invoice.   Our billing staff will not be able to assist you with questions regarding bills from these companies.  You will be contacted with the lab results as soon as they are available. The fastest way to get your results is to activate your My Chart account. Instructions are located on the last page of this paperwork. If you have not heard from [place] regarding the results in 2 weeks, please contact this office.

## 2016-02-04 NOTE — Addendum Note (Signed)
Addended by: Ezekiel Slocumb on: 02/04/2016 02:36 PM   Modules accepted: Level of Service, SmartSet

## 2016-02-04 NOTE — Addendum Note (Signed)
Addended by: Lupe Carney on: 02/04/2016 02:46 PM   Modules accepted: Orders

## 2016-02-04 NOTE — Progress Notes (Addendum)
[facility] [address] [place] [address] [phone]  Date:  02/04/2016   Name:  Randall Reed.   DOB:  Jan 27, 1971   MRN:  [id number]  PCP:  Leandrew Koyanagi, MD    Chief Complaint: Sore Throat; Fatigue; and Ear Pain   History of Present Illness:  This is a 45 y.o. male with PMH asthma, allergic rhinitis, gerd, HTN, ED, insomnia, anxiety who is presenting with 2 days of sore throat, otalgia and fatigue. Mild cough that started this AM. Pt states this is typically how he feels before a sinus infection. He has recurrent sinusitis. States he used to get sinus infections every 2-3 months. Since moving to [place] in this past year he has only had 2. He usually sees Dr. Laney Pastor who prescribes abx. Denies nasal congestion, sinus pressure, fever, chills. Pt has mild int asthma. Has rx for advair but only uses during flares. No problems with sob or wheezing currently. He has allergic rhinitis and takes singulair, allegra, flonase and atrovent nasal spray.  Review of Systems:  Review of Systems See HPI  Patient Active Problem List   Diagnosis Date Noted  . Asthma 11/19/2015  . 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 rotator cuff tear 10/17/2013  . Hypogonadism male 09/04/2013  . ED (erectile dysfunction) 07/30/2013  . Palpitations 02/21/2012  . BMI 33.0-33.9,adult 02/04/2012  . Allergic rhinitis 02/04/2012  . GERD (gastroesophageal reflux disease) 02/04/2012  . Migraine 02/04/2012  . GAD (generalized anxiety disorder) 02/04/2012  . Insomnia 02/04/2012  . HYPERTENSION, BENIGN 04/13/2009  . CHEST PAIN-UNSPECIFIED 04/13/2009    Prior to Admission medications   Medication Sig Start Date End Date Taking? Authorizing Provider  acetic acid-hydrocortisone (VOSOL-HC) otic solution Place 3 drops into both ears 3 (three) times daily. Use as needed to prevent swimmer's ear 01/10/15  Yes Leandrew Koyanagi, MD  albuterol (PROVENTIL HFA;VENTOLIN  HFA) 108 (90 Base) MCG/ACT inhaler Inhale 2 puffs into the lungs every 6 (six) hours as needed for wheezing or shortness of breath.   Yes Historical Provider, MD  ALPRAZolam [name]) 1 MG tablet Take 1 mg by mouth 3 (three) times daily as needed for anxiety.   Yes Historical Provider, MD  amLODipine (NORVASC) 10 MG tablet TAKE 1 TABLET(10 MG) BY MOUTH DAILY 11/17/15  Yes Sherren Mocha, MD  aspirin EC 81 MG tablet Take 1 tablet (81 mg total) by mouth daily. 03/07/15  Yes Sherren Mocha, MD  cyclobenzaprine (FLEXERIL) 10 MG tablet Take 10 mg by mouth 3 (three) times daily as needed for muscle spasms.   Yes Historical Provider, MD  diclofenac (VOLTAREN) 75 MG EC tablet Take 1 tablet (75 mg total) by mouth 2 (two) times daily. 10/17/13  Yes Leandrew Koyanagi, MD  esomeprazole (NEXIUM) 40 MG capsule Take 1 capsule (40 mg total) by mouth daily before breakfast. 04/25/15  Yes Leandrew Koyanagi, MD  Fexofenadine HCl (ALLEGRA PO) Take 1 tablet by mouth daily.    Yes Historical Provider, MD  fluticasone (FLONASE) 50 MCG/ACT nasal spray INSTILL 2 SPRAYS IN EACH NOSTRIL DAILY 04/25/15  Yes Leandrew Koyanagi, MD  Fluticasone-Salmeterol (ADVAIR DISKUS) 250-50 MCG/DOSE AEPB Inhale 1 puff into the lungs 2 (two) times daily. 11/18/15  Yes Leandrew Koyanagi, MD  hydrochlorothiazide (HYDRODIURIL) 25 MG tablet Take 1 tablet (25 mg total) by mouth daily. 03/07/15  Yes Sherren Mocha, MD  hydrocortisone (ANUSOL-HC) 2.5 % rectal cream Place 1 application rectally 2 (two) times daily.  Yes Historical Provider, MD  ipratropium (ATROVENT HFA) 17 MCG/ACT inhaler INHALE 2 PUFFS INTO THE LUNGS TWICE DAILY OR AS DIRECTED 11/18/15  Yes Leandrew Koyanagi, MD  ipratropium (ATROVENT) 0.06 % nasal spray Place 2 sprays into the nose 2 (two) times daily. 11/18/15  Yes Leandrew Koyanagi, MD  ketoprofen (ORUDIS) 75 MG capsule Take 75 mg by mouth 4 (four) times daily as needed for mild pain.   Yes Historical Provider, MD  montelukast  (SINGULAIR) 10 MG tablet Take 1 tablet (10 mg total) by mouth at bedtime. 11/18/15  Yes Leandrew Koyanagi, MD  rizatriptan (MAXALT-MLT) 10 MG disintegrating tablet TAKE AS DIRECTED 11/18/15  Yes Leandrew Koyanagi, MD  tadalafil (CIALIS) 20 MG tablet Take 1 tablet (20 mg total) by mouth daily as needed for erectile dysfunction. 01/13/16  Yes Sherren Mocha, MD  valsartan (DIOVAN) 80 MG tablet TAKE 1 TABLET(80 MG) BY MOUTH DAILY 01/23/16  Yes Sherren Mocha, MD  zolpidem (AMBIEN) 10 MG tablet Take 1 tablet (10 mg total) by mouth at bedtime. 11/18/15  Yes Leandrew Koyanagi, MD    Allergies  Allergen Reactions  . Desloratadine Other (See Comments)    CLARINEX-"severe headache"  . Hydrocod Polst-Cpm Polst Er Itching  . Loratadine Other (See Comments)    "severe headache"  . Tussionex Pennkinetic Er [Hydrocod Polst-Cpm Polst Er] Itching  . Levbid [Hyoscyamine Sulfate] Rash  . Telithromycin Rash    Past Surgical History  Procedure Laterality Date  . None    . None      Social History  Substance Use Topics  . Smoking status: Never Smoker   . Smokeless tobacco: Never Used  . Alcohol Use: 1.8 oz/week    3 Standard drinks or equivalent per week    Family History  Problem Relation Age of Onset  . Hypertension    . Heart attack    . Cancer    . Breast cancer    . Hyperlipidemia    . Coronary artery disease Father 5    stents, CABG  . Heart attack Father   . Coronary artery disease Paternal Grandfather   . Heart attack Maternal Grandfather     Medication list has been reviewed and updated.  Physical Examination:  Physical Exam  Constitutional: He is oriented to person, place, and time. He appears well-developed and well-nourished. No distress.  HENT:  Head: Normocephalic and atraumatic.  Right Ear: Hearing, external ear and ear canal normal. Tympanic membrane is retracted.  Left Ear: Hearing, tympanic membrane, external ear and ear canal normal.  Nose: Nose normal. Right  sinus exhibits no maxillary sinus tenderness and no frontal sinus tenderness. Left sinus exhibits no maxillary sinus tenderness and no frontal sinus tenderness.  Mouth/Throat: Uvula is midline and mucous membranes are normal. Posterior oropharyngeal erythema present. No oropharyngeal exudate or posterior oropharyngeal edema.  Eyes: Conjunctivae and lids are normal. Right eye exhibits no discharge. Left eye exhibits no discharge. No scleral icterus.  Cardiovascular: Normal rate, regular rhythm, normal heart sounds and normal pulses.   No murmur heard. Pulmonary/Chest: Effort normal and breath sounds normal. No respiratory distress. He has no wheezes. He has no rhonchi. He has no rales.  Musculoskeletal: Normal range of motion.  Lymphadenopathy:       Head (right side): No submental, no submandibular and no tonsillar adenopathy present.       Head (left side): No submental, no submandibular and no tonsillar adenopathy present.    He has no cervical adenopathy.  Neurological: He is alert and oriented to person, place, and time.  Skin: Skin is warm, dry and intact. No lesion and no rash noted.  Psychiatric: He has a normal mood and affect. His speech is normal and behavior is normal. Thought content normal.   BP 106/64 mmHg  Pulse 120  Temp(Src) 98 F (36.7 C) (Oral)  Resp 16  Ht 5\' 11"  (1.803 m)  Wt 247 lb (112.038 kg)  BMI 34.46 kg/m2  SpO2 98%  Results for orders placed or performed in visit on 02/04/16  POCT rapid strep A  Result Value Ref Range   Rapid Strep A Screen Negative Negative   Assessment and Plan:  1. Allergic rhinitis, unspecified allergic rhinitis type 2. Viral URI 3. Sore throat Allergies vs URI or combination of both. Advised mucinex and afrin bid x 3 days. Pt was very unhappy stating that he knows his body and that this is a sinus infection. I explained that his symptoms were not necessarily indicative of infection and in addition, it takes more than 2 days for a  sinus infection to develop. He is on abx so frequently for sinus infections that it really would benefit him to wait out his symptoms for at least a week to see if they will get better on own. I told pt I would send a message to Dr. Laney Pastor, who could send in an abx if he saw fit. - POCT rapid strep A   Benjaman Pott. Drenda Freeze, MHS [facility]  02/04/2016

## 2016-02-05 LAB — CULTURE, GROUP A STREP: ORGANISM ID, BACTERIA: NORMAL

## 2016-02-06 NOTE — Progress Notes (Signed)
Way to hold up!!

## 2016-02-10 ENCOUNTER — Other Ambulatory Visit: Payer: Self-pay | Admitting: Cardiovascular Disease

## 2016-03-07 ENCOUNTER — Other Ambulatory Visit: Payer: Self-pay | Admitting: Cardiovascular Disease

## 2016-03-10 ENCOUNTER — Other Ambulatory Visit: Payer: Self-pay | Admitting: Internal Medicine

## 2016-03-14 ENCOUNTER — Telehealth: Payer: Self-pay

## 2016-03-14 DIAGNOSIS — K589 Irritable bowel syndrome without diarrhea: Secondary | ICD-10-CM | POA: Insufficient documentation

## 2016-03-14 DIAGNOSIS — K580 Irritable bowel syndrome with diarrhea: Secondary | ICD-10-CM | POA: Insufficient documentation

## 2016-03-14 NOTE — Telephone Encounter (Signed)
PA completed for generic Librax on covermymeds. Pending.

## 2016-03-16 ENCOUNTER — Telehealth: Payer: BLUE CROSS/BLUE SHIELD | Admitting: Family

## 2016-03-16 DIAGNOSIS — J019 Acute sinusitis, unspecified: Secondary | ICD-10-CM

## 2016-03-16 MED ORDER — AMOXICILLIN-POT CLAVULANATE 875-125 MG PO TABS: 1.0000 | ORAL_TABLET | Freq: Two times a day (BID) | ORAL | Status: DC

## 2016-03-16 NOTE — Progress Notes (Signed)
We are sorry that you are not feeling well.  Here is how we plan to help!  Based on what you have shared with me it looks like you have sinusitis.  Sinusitis is inflammation and infection in the sinus cavities of the head.  Based on your presentation I believe you most likely have Acute Bacterial Sinusitis.  This is an infection caused by bacteria and is treated with antibiotics. I have prescribed Augmentin 875mg /125mg  one tablet twice daily with food, for 7 days. You may use an oral decongestant such as Mucinex D or if you have glaucoma or high blood pressure use plain Mucinex. Saline nasal spray help and can safely be used as often as needed for congestion.  If you develop worsening sinus pain, fever or notice severe headache and vision changes, or if symptoms are not better after completion of antibiotic, please schedule an appointment with a health care provider.    In addition you may use A non-prescription cough medication called Robitussin DAC. Take 2 teaspoons every 8 hours or Delsym: take 2 teaspoons every 12 hours., A non-prescription cough medication called Mucinex DM: take 2 tablets every 12 hours. You may take 1-2 capsules every 8 hours as needed for your cough.  Sinus infections are not as easily transmitted as other respiratory infection, however we still recommend that you avoid close contact with loved ones, especially the very young and elderly.  Remember to wash your hands thoroughly throughout the day as this is the number one way to prevent the spread of infection!  Home Care:  Only take medications as instructed by your medical team.  Complete the entire course of an antibiotic.  Do not take these medications with alcohol.  A steam or ultrasonic humidifier can help congestion.  You can place a towel over your head and breathe in the steam from hot water coming from a faucet.  Avoid close contacts especially the very young and the elderly.  Cover your mouth when you cough or  sneeze.  Always remember to wash your hands.  Get Help Right Away If:  You develop worsening fever or sinus pain.  You develop a severe head ache or visual changes.  Your symptoms persist after you have completed your treatment plan.  Make sure you  Understand these instructions.  Will watch your condition.  Will get help right away if you are not doing well or get worse.  Your e-visit answers were reviewed by a board certified advanced clinical practitioner to complete your personal care plan.  Depending on the condition, your plan could have included both over the counter or prescription medications.  If there is a problem please reply  once you have received a response from your provider.  Your safety is important to [place].  If you have drug allergies check your prescription carefully.    You can use MyChart to ask questions about today's visit, request a non-urgent call back, or ask for a work or school excuse for 24 hours related to this e-Visit. If it has been greater than 24 hours you will need to follow up with your provider, or enter a new e-Visit to address those concerns.  You will get an e-mail in the next two days asking about your experience.  I hope that your e-visit has been valuable and will speed your recovery. Thank you for using e-visits.

## 2016-03-27 ENCOUNTER — Encounter: Payer: Self-pay | Admitting: Cardiovascular Disease

## 2016-04-04 ENCOUNTER — Telehealth: Payer: Self-pay

## 2016-04-04 NOTE — Telephone Encounter (Signed)
PA completed on covermymeds for Librax which pt takes for IBS. I have gone back into pt's paper chart and do not see any documentation of other IBS meds tried before this started 10/13/12. Pending.

## 2016-04-06 ENCOUNTER — Other Ambulatory Visit: Payer: Self-pay | Admitting: Internal Medicine

## 2016-04-20 ENCOUNTER — Ambulatory Visit (INDEPENDENT_AMBULATORY_CARE_PROVIDER_SITE_OTHER): Payer: BLUE CROSS/BLUE SHIELD | Admitting: Internal Medicine

## 2016-04-20 VITALS — BP 130/88 | HR 88 | Temp 98.4°F | Resp 16 | Ht 71.0 in | Wt 230.0 lb

## 2016-04-20 DIAGNOSIS — Z209 Contact with and (suspected) exposure to unspecified communicable disease: Secondary | ICD-10-CM | POA: Diagnosis not present

## 2016-04-20 DIAGNOSIS — K580 Irritable bowel syndrome with diarrhea: Secondary | ICD-10-CM

## 2016-04-20 DIAGNOSIS — F411 Generalized anxiety disorder: Secondary | ICD-10-CM

## 2016-04-20 DIAGNOSIS — E291 Testicular hypofunction: Secondary | ICD-10-CM

## 2016-04-20 DIAGNOSIS — J4520 Mild intermittent asthma, uncomplicated: Secondary | ICD-10-CM | POA: Diagnosis not present

## 2016-04-20 DIAGNOSIS — K2100 Gastro-esophageal reflux disease with esophagitis, without bleeding: Secondary | ICD-10-CM

## 2016-04-20 DIAGNOSIS — G43109 Migraine with aura, not intractable, without status migrainosus: Secondary | ICD-10-CM | POA: Diagnosis not present

## 2016-04-20 DIAGNOSIS — J309 Allergic rhinitis, unspecified: Secondary | ICD-10-CM

## 2016-04-20 DIAGNOSIS — N529 Male erectile dysfunction, unspecified: Secondary | ICD-10-CM | POA: Diagnosis not present

## 2016-04-20 DIAGNOSIS — R252 Cramp and spasm: Secondary | ICD-10-CM

## 2016-04-20 DIAGNOSIS — I10 Essential (primary) hypertension: Secondary | ICD-10-CM | POA: Diagnosis not present

## 2016-04-20 DIAGNOSIS — G4700 Insomnia, unspecified: Secondary | ICD-10-CM | POA: Diagnosis not present

## 2016-04-20 LAB — CBC WITH DIFFERENTIAL/PLATELET
BASOS PCT: 0 %
Basophils Absolute: 0 cells/uL (ref 0–200)
EOS ABS: 0 {cells}/uL — AB (ref 15–500)
EOS PCT: 0 %
HCT: 47.9 % (ref 38.5–50.0)
Hemoglobin: 16.5 g/dL (ref 13.2–17.1)
LYMPHS PCT: 23 %
Lymphs Abs: 2001 cells/uL (ref 850–3900)
MCH: 29.5 pg (ref 27.0–33.0)
MCHC: 34.4 g/dL (ref 32.0–36.0)
MCV: 85.5 fL (ref 80.0–100.0)
MONOS PCT: 9 %
MPV: 9.1 fL (ref 7.5–12.5)
Monocytes Absolute: 783 cells/uL (ref 200–950)
Neutro Abs: 5916 cells/uL (ref 1500–7800)
Neutrophils Relative %: 68 %
PLATELETS: 206 10*3/uL (ref 140–400)
RBC: 5.6 MIL/uL (ref 4.20–5.80)
RDW: 14.4 % (ref 11.0–15.0)
WBC: 8.7 10*3/uL (ref 3.8–10.8)

## 2016-04-20 LAB — COMPREHENSIVE METABOLIC PANEL
ALK PHOS: 56 U/L (ref 40–115)
ALT: 23 U/L (ref 9–46)
AST: 18 U/L (ref 10–40)
Albumin: 4.5 g/dL (ref 3.6–5.1)
BUN: 15 mg/dL (ref 7–25)
CHLORIDE: 105 mmol/L (ref 98–110)
CO2: 22 mmol/L (ref 20–31)
Calcium: 9.3 mg/dL (ref 8.6–10.3)
Creat: 1.08 mg/dL (ref 0.60–1.35)
GLUCOSE: 94 mg/dL (ref 65–99)
POTASSIUM: 4 mmol/L (ref 3.5–5.3)
Sodium: 137 mmol/L (ref 135–146)
Total Bilirubin: 0.6 mg/dL (ref 0.2–1.2)
Total Protein: 7 g/dL (ref 6.1–8.1)

## 2016-04-20 LAB — CK: Total CK: 285 U/L — ABNORMAL HIGH (ref 7–232)

## 2016-04-20 LAB — POCT GLYCOSYLATED HEMOGLOBIN (HGB A1C): HEMOGLOBIN A1C: 5.1

## 2016-04-20 MED ORDER — CYCLOBENZAPRINE HCL 10 MG PO TABS: 10.0000 mg | ORAL_TABLET | Freq: Three times a day (TID) | ORAL | Status: DC | PRN

## 2016-04-20 MED ORDER — KETOPROFEN 75 MG PO CAPS: 75.0000 mg | ORAL_CAPSULE | Freq: Four times a day (QID) | ORAL | Status: DC | PRN

## 2016-04-20 MED ORDER — FLUTICASONE-SALMETEROL 250-50 MCG/DOSE IN AEPB: 1.0000 | INHALATION_SPRAY | Freq: Two times a day (BID) | RESPIRATORY_TRACT | Status: DC

## 2016-04-20 MED ORDER — DICYCLOMINE HCL 20 MG PO TABS: 20.0000 mg | ORAL_TABLET | Freq: Four times a day (QID) | ORAL | Status: DC

## 2016-04-20 MED ORDER — ALPRAZOLAM 1 MG PO TABS: 1.0000 mg | ORAL_TABLET | Freq: Three times a day (TID) | ORAL | Status: DC | PRN

## 2016-04-20 MED ORDER — EMTRICITABINE-TENOFOVIR DF 200-300 MG PO TABS: 1.0000 | ORAL_TABLET | Freq: Every day | ORAL | Status: DC

## 2016-04-20 MED ORDER — FLUTICASONE PROPIONATE 50 MCG/ACT NA SUSP: NASAL | Status: DC

## 2016-04-20 MED ORDER — IPRATROPIUM BROMIDE HFA 17 MCG/ACT IN AERS: INHALATION_SPRAY | RESPIRATORY_TRACT | Status: DC

## 2016-04-20 MED ORDER — ALBUTEROL SULFATE HFA 108 (90 BASE) MCG/ACT IN AERS: 2.0000 | INHALATION_SPRAY | Freq: Four times a day (QID) | RESPIRATORY_TRACT | Status: DC | PRN

## 2016-04-20 MED ORDER — ZOLPIDEM TARTRATE 10 MG PO TABS: 10.0000 mg | ORAL_TABLET | Freq: Every day | ORAL | Status: DC

## 2016-04-20 MED ORDER — RIZATRIPTAN BENZOATE 10 MG PO TBDP: ORAL_TABLET | ORAL | Status: DC

## 2016-04-20 MED ORDER — MONTELUKAST SODIUM 10 MG PO TABS: 10.0000 mg | ORAL_TABLET | Freq: Every day | ORAL | Status: DC

## 2016-04-20 NOTE — Patient Instructions (Addendum)
     IF you received an x-ray today, you will receive an invoice from [facility]. Please contact [facility] at [phone] with questions or concerns regarding your invoice.   IF you received labwork today, you will receive an invoice from [employer]. Please contact [facility] at [phone] with questions or concerns regarding your invoice.   Our billing staff will not be able to assist you with questions regarding bills from these companies.  You will be contacted with the lab results as soon as they are available. The fastest way to get your results is to activate your My Chart account. Instructions are located on the last page of this paperwork. If you have not heard from [place] regarding the results in 2 weeks, please contact this office.     Factors that are thought to predispose to Exercise Assoc Mus Cramps include: ?Sweat with high salt concentration (ie, "salty sweaters")  ?Heavy sweating  ?Dehydration ?Insufficient sodium intake prior to and during intense activity  ?Lack of heat acclimatization  ?Baseline (preactivity) fatigue ?History of heat cramps

## 2016-04-20 NOTE — Progress Notes (Signed)
Subjective:  By signing my name below, I, Essence Howell, attest that this documentation has been prepared under the direction and in the presence of Leandrew Koyanagi, MD Electronically Signed: Ladene Artist, ED Scribe 04/20/2016 at 4:20 PM.   Patient ID: Randall Apple., male    DOB: May 12, 1971, 45 y.o.   MRN: [id number]  Chief Complaint  Patient presents with  . Medication Refill    albuterol, xanax, felxeril, voltaren, flonase, advair, anusol, vosol,atrovent, atrovent HFA, orudis, singulair, maxalt, ambien    HPI HPI Comments: Randall Reed. is a 45 y.o. male, with a h/o asthma, who presents to the [facility] for a medication refill of albuterol, xanax, felxeril, voltaren, flonase, advair, anusol, vosol,atrovent, atrovent HFA, orudis, singulair, maxalt, ambien.   LE Cramping Pt reports 1 hypotensive episode while taking an evening spin class a few weeks ago. He states that both legs cramped while on the stationary bicycle and he felt dehydrated. Pt has taken electrolyte replenishers and took 3-4 days off at the gym with improvement of exercise induced cramping, however, he reports that he occasionally still experiences chest palpitations. Pt checked his BP following the episode and noticed that his BP had dropped significantly to 100/66. He has checked his BP numerous times since the episode with normal-low readings. Triage BP: 130/88. He states that his cardiologist advised him to stop HCTZ and started him on half an amlodipine tablet and continued valsartan. Pt denies nocturnal cramping.   Asthma Pt states that he has had to use his Advair inhaler more often this season. He reports 2 sinus infections, a bad allergy season and a recent asthma exacerbation with associated wheezing which improves with Advair. Pt recently ran out of Advair and requests a refill at this visit.   Patient Active Problem List   Diagnosis Date Noted  . Irritable bowel syndrome  03/14/2016  . Asthma 11/19/2015  . 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 rotator cuff tear 10/17/2013  . Hypogonadism male 09/04/2013  . ED (erectile dysfunction) 07/30/2013  . Palpitations 02/21/2012  . BMI 33.0-33.9,adult 02/04/2012  . Allergic rhinitis 02/04/2012  . GERD (gastroesophageal reflux disease) 02/04/2012  . Migraine 02/04/2012  . GAD (generalized anxiety disorder) 02/04/2012  . Insomnia 02/04/2012  . HYPERTENSION, BENIGN 04/13/2009  . CHEST PAIN-UNSPECIFIED 04/13/2009    Past Medical History  Diagnosis Date  . GERD (gastroesophageal reflux disease)   . Anxiety   . Seasonal allergic reaction   . Depression   . Arthritis   . Asthma    Current Outpatient Prescriptions on File Prior to Visit  Medication Sig Dispense Refill  . acetic acid-hydrocortisone (VOSOL-HC) otic solution Place 3 drops into both ears 3 (three) times daily. Use as needed to prevent swimmer's ear 10 mL 5  . albuterol (PROVENTIL HFA;VENTOLIN HFA) 108 (90 Base) MCG/ACT inhaler Inhale 2 puffs into the lungs every 6 (six) hours as needed for wheezing or shortness of breath.    . ALPRAZolam (XANAX) 1 MG tablet Take 1 mg by mouth 3 (three) times daily as needed for anxiety.    Marland Kitchen amLODipine (NORVASC) 10 MG tablet TAKE 1 TABLET(10 MG) BY MOUTH DAILY 30 tablet 3  . aspirin EC 81 MG tablet Take 1 tablet (81 mg total) by mouth daily.    . cyclobenzaprine (FLEXERIL) 10 MG tablet Take 10 mg by mouth 3 (three) times daily as needed for muscle spasms.    . diclofenac (VOLTAREN) 75 MG EC tablet Take  1 tablet (75 mg total) by mouth 2 (two) times daily. 60 tablet 5  . esomeprazole (NEXIUM) 40 MG capsule Take 1 capsule (40 mg total) by mouth daily before breakfast. 30 capsule 11  . Fexofenadine HCl (ALLEGRA PO) Take 1 tablet by mouth daily.     . fluticasone (FLONASE) 50 MCG/ACT nasal spray INSTILL 2 SPRAYS IN EACH NOSTRIL DAILY 16 g 10  . Fluticasone-Salmeterol (ADVAIR DISKUS) 250-50 MCG/DOSE AEPB  Inhale 1 puff into the lungs 2 (two) times daily. 1 each 11  . hydrocortisone (ANUSOL-HC) 2.5 % rectal cream Place 1 application rectally 2 (two) times daily.    Marland Kitchen ipratropium (ATROVENT HFA) 17 MCG/ACT inhaler INHALE 2 PUFFS INTO THE LUNGS TWICE DAILY OR AS DIRECTED 12.9 g 11  . ipratropium (ATROVENT) 0.06 % nasal spray Place 2 sprays into the nose 2 (two) times daily. 15 mL 9  . ketoprofen (ORUDIS) 75 MG capsule Take 75 mg by mouth 4 (four) times daily as needed for mild pain.    . montelukast (SINGULAIR) 10 MG tablet Take 1 tablet (10 mg total) by mouth at bedtime. 30 tablet 11  . pantoprazole (PROTONIX) 40 MG tablet TAKE 1 TABLET BY MOUTH ONCE DAILY 30 tablet 0  . rizatriptan (MAXALT-MLT) 10 MG disintegrating tablet TAKE AS DIRECTED 9 tablet 11  . tadalafil (CIALIS) 20 MG tablet Take 1 tablet (20 mg total) by mouth daily as needed for erectile dysfunction. 10 tablet 2  . valsartan (DIOVAN) 80 MG tablet TAKE 1 TABLET(80 MG) BY MOUTH DAILY 30 tablet 11  . zolpidem (AMBIEN) 10 MG tablet Take 1 tablet (10 mg total) by mouth at bedtime. 30 tablet 5   No current facility-administered medications on file prior to visit.   Allergies  Allergen Reactions  . Desloratadine Other (See Comments)    CLARINEX-"severe headache"  . Hydrocod Polst-Cpm Polst Er Itching  . Loratadine Other (See Comments)    "severe headache"  . Tussionex Pennkinetic Er [Hydrocod Polst-Cpm Polst Er] Itching  . Levbid [Hyoscyamine Sulfate] Rash  . Telithromycin Rash   Review of Systems  Respiratory: Positive for wheezing.   Cardiovascular: Positive for palpitations.  Musculoskeletal: Positive for arthralgias.   BP 130/88 mmHg  Pulse 88  Temp(Src) 98.4 F (36.9 C) (Oral)  Resp 16  Ht 5\' 11"  (1.803 m)  Wt 230 lb (104.327 kg)  BMI 32.09 kg/m2  SpO2 97%    Objective:   Physical Exam  Constitutional: He is oriented to person, place, and time. He appears well-developed and well-nourished. No distress.  HENT:  Head:  Normocephalic and atraumatic.  Eyes: Conjunctivae are normal. Pupils are equal, round, and reactive to light.  Neck: Neck supple.  Cardiovascular: Normal rate, regular rhythm, normal heart sounds and intact distal pulses.   No murmur heard. Pulmonary/Chest: Effort normal and breath sounds normal. No respiratory distress.  Musculoskeletal: Normal range of motion.  Lymphadenopathy:    He has no cervical adenopathy.  Neurological: He is alert and oriented to person, place, and time. No cranial nerve deficit.  Skin: Skin is warm and dry.  Psychiatric: He has a normal mood and affect. His behavior is normal.  Nursing note and vitals reviewed.     Assessment & Plan:   Muscle cramps during exercise - Plan: Comprehensive metabolic panel, CK, POCT glycosylated hemoglobin (Hb A1C)  Exposure to communicable disease - Plan: HIV antibody, RPR, GC/Chlamydia Probe Amp --wants this screen due to hx--also would like truvada to use as prophylaxis  Reactive airway disease,  mild intermittent, uncomplicated - Plan: CBC with Differential/Platelet  Allergic rhinitis, unspecified allergic rhinitis type - Plan: ipratropium (ATROVENT HFA) 17 MCG/ACT inhaler  Asthma, mild intermittent, uncomplicated  Erectile dysfunction, unspecified erectile dysfunction type  GAD (generalized anxiety disorder)  Gastroesophageal reflux disease with esophagitis  HYPERTENSION, BENIGN  Hypogonadism male - Plan: POCT glycosylated hemoglobin (Hb A1C)  Insomnia  Irritable bowel syndrome with diarrhea  Migraine with aura and without status migrainosus, not intractable  Meds ordered this encounter  Medications  . emtricitabine-tenofovir (TRUVADA) 200-300 MG tablet    Sig: Take 1 tablet by mouth daily.    Dispense:  30 tablet    Refill:  11  . albuterol (PROVENTIL HFA;VENTOLIN HFA) 108 (90 Base) MCG/ACT inhaler    Sig: Inhale 2 puffs into the lungs every 6 (six) hours as needed for wheezing or shortness of breath.     Dispense:  1 Inhaler    Refill:  11  . ALPRAZolam (XANAX) 1 MG tablet    Sig: Take 1 tablet (1 mg total) by mouth 3 (three) times daily as needed for anxiety.    Dispense:  30 tablet    Refill:  5  . cyclobenzaprine (FLEXERIL) 10 MG tablet    Sig: Take 1 tablet (10 mg total) by mouth 3 (three) times daily as needed for muscle spasms.    Dispense:  30 tablet    Refill:  11  . fluticasone (FLONASE) 50 MCG/ACT nasal spray    Sig: INSTILL 2 SPRAYS IN EACH NOSTRIL DAILY    Dispense:  16 g    Refill:  10  . Fluticasone-Salmeterol (ADVAIR DISKUS) 250-50 MCG/DOSE AEPB    Sig: Inhale 1 puff into the lungs 2 (two) times daily.    Dispense:  1 each    Refill:  11  . ipratropium (ATROVENT HFA) 17 MCG/ACT inhaler    Sig: INHALE 2 PUFFS INTO THE LUNGS TWICE DAILY OR AS DIRECTED    Dispense:  12.9 g    Refill:  11  . ketoprofen (ORUDIS) 75 MG capsule    Sig: Take 1 capsule (75 mg total) by mouth 4 (four) times daily as needed for mild pain.    Dispense:  30 capsule    Refill:  11  . montelukast (SINGULAIR) 10 MG tablet    Sig: Take 1 tablet (10 mg total) by mouth at bedtime.    Dispense:  30 tablet    Refill:  11  . rizatriptan (MAXALT-MLT) 10 MG disintegrating tablet    Sig: TAKE AS DIRECTED    Dispense:  9 tablet    Refill:  11  . zolpidem (AMBIEN) 10 MG tablet    Sig: Take 1 tablet (10 mg total) by mouth at bedtime.    Dispense:  30 tablet    Refill:  5  . dicyclomine (BENTYL) 20 MG tablet    Sig: Take 1 tablet (20 mg total) by mouth every 6 (six) hours. As needed for symptoms    Dispense:  30 tablet    Refill:  11   I have completed the patient encounter in its entirety as documented by the scribe, with editing by me where necessary. Christyan Reger P. Laney Pastor, M.D.

## 2016-04-21 LAB — GC/CHLAMYDIA PROBE AMP
CT Probe RNA: NOT DETECTED
GC PROBE AMP APTIMA: NOT DETECTED

## 2016-04-21 LAB — RPR

## 2016-04-23 LAB — HIV ANTIBODY (ROUTINE TESTING W REFLEX): HIV 1&2 Ab, 4th Generation: NONREACTIVE

## 2016-04-24 ENCOUNTER — Telehealth: Payer: Self-pay

## 2016-04-24 NOTE — Telephone Encounter (Signed)
Story is the same--atrovent now only approved for COPD not responsive to spiriva Pulm doc might be able to get this approved somehow

## 2016-04-24 NOTE — Telephone Encounter (Signed)
Dr Laney Pastor, I tried to get Atrovent inhaler covered by ins in Jan, and I will copy/paste the message notes about that here below. Has anything changed? I don't think it will be covered now for same reason.    Randall Koyanagi, MD at 12/05/2015 2:29 PM     Status: Signed       Expand All Collapse All   Dx is asthma so he should have to stick to inhaled steroids and albuterol which i think he has            Dallas Schimke, RN at 12/05/2015 2:06 PM     Status: Signed       Expand All Collapse All   PA denied because pt has not tried/failed Spiriva. Dr Laney Pastor, I see your note to me that we'll have to give in to this. Does this mean that you want to Rx Spiriva? Not sure what strength/dose you want to Rx.

## 2016-04-25 NOTE — Telephone Encounter (Signed)
I will try to get it covered with the new info that pt is not responsive to spiriva. Completed on covermymeds. Pending.

## 2016-05-02 ENCOUNTER — Other Ambulatory Visit: Payer: Self-pay

## 2016-05-02 NOTE — Telephone Encounter (Signed)
Fax request from [facility] for PA for Atrovent HFA Aerosol Form placed in Randall Reed's box for completion.

## 2016-05-07 NOTE — Telephone Encounter (Signed)
I called [employer] to find out why the latest PA was cancelled on covermymeds and was told that they received other before that so cancelled the last one. They actually reviewed PA again and it was approved this time with the new info about pt failing spiriva and was backdated for coverage from 11/25/15 - the life of the policy. Notified pharmacy and asked them to notify pt when ready.

## 2016-05-07 NOTE — Telephone Encounter (Signed)
Dr Laney Pastor I called [place] because I have already done this multiple times. I was advised that it was finally approved on 6/26 and I have let pharm know. You may shred the latest form that Rose put in your box.

## 2016-05-09 ENCOUNTER — Encounter: Payer: Self-pay | Admitting: Internal Medicine

## 2016-05-09 MED ORDER — DICYCLOMINE HCL 20 MG PO TABS
20.0000 mg | ORAL_TABLET | Freq: Four times a day (QID) | ORAL | Status: DC
Start: 2016-05-09 — End: 2016-11-29

## 2016-05-09 NOTE — Telephone Encounter (Signed)
rx updated since working Meds ordered this encounter  Medications  . dicyclomine (BENTYL) 20 MG tablet    Sig: Take 1 tablet (20 mg total) by mouth every 6 (six) hours. As needed for symptoms    Dispense:  90 tablet    Refill:  5

## 2016-05-09 NOTE — Telephone Encounter (Signed)
Rx had printed out, so I called it into the pharmacy. Pt aware.

## 2016-07-08 ENCOUNTER — Other Ambulatory Visit: Payer: Self-pay | Admitting: Cardiovascular Disease

## 2016-07-30 ENCOUNTER — Telehealth: Payer: Self-pay

## 2016-07-30 NOTE — Telephone Encounter (Addendum)
Prior auth for Cialis 20mg  submitted to [facility]. Cialis approved from today through 11/04/2038.

## 2016-08-23 DIAGNOSIS — Z23 Encounter for immunization: Secondary | ICD-10-CM | POA: Diagnosis not present

## 2016-08-30 ENCOUNTER — Other Ambulatory Visit: Payer: Self-pay

## 2016-08-30 MED ORDER — PANTOPRAZOLE SODIUM 40 MG PO TBEC: 40.0000 mg | DELAYED_RELEASE_TABLET | Freq: Every day | ORAL | 0 refills | Status: DC

## 2016-09-06 ENCOUNTER — Other Ambulatory Visit: Payer: Self-pay | Admitting: Cardiovascular Disease

## 2016-09-06 DIAGNOSIS — I10 Essential (primary) hypertension: Secondary | ICD-10-CM

## 2016-09-07 NOTE — Telephone Encounter (Signed)
Pharmacy requesting a refill for Cialis 20 mg tablet. Would you like to refill this medication? Please advise

## 2016-09-19 ENCOUNTER — Encounter: Payer: Self-pay | Admitting: Cardiovascular Disease

## 2016-09-19 NOTE — Telephone Encounter (Signed)
Reviewed my chart message with Dr Burt Knack and he recommends that the Randall Reed be seen in the office for an appointment.  I contacted the Randall Reed and offered him an appointment on 09/20/16 but the Randall Reed said he needs a Friday.  Currently Dr Burt Knack is making changes in his December schedule and there may be availability on 10/05/16.  I advised the Randall Reed that I will contact him with an appointment once the schedule is updated.  Randall Reed agreed with plan.

## 2016-09-21 ENCOUNTER — Other Ambulatory Visit: Payer: Self-pay | Admitting: Family Medicine

## 2016-10-05 ENCOUNTER — Ambulatory Visit (INDEPENDENT_AMBULATORY_CARE_PROVIDER_SITE_OTHER): Payer: BLUE CROSS/BLUE SHIELD | Admitting: Cardiovascular Disease

## 2016-10-05 ENCOUNTER — Encounter: Payer: Self-pay | Admitting: Cardiovascular Disease

## 2016-10-05 VITALS — BP 124/80 | HR 70 | Ht 71.0 in | Wt 247.4 lb

## 2016-10-05 DIAGNOSIS — I208 Other forms of angina pectoris: Secondary | ICD-10-CM | POA: Diagnosis not present

## 2016-10-05 DIAGNOSIS — I10 Essential (primary) hypertension: Secondary | ICD-10-CM

## 2016-10-05 MED ORDER — AMLODIPINE BESYLATE 10 MG PO TABS: 10.0000 mg | ORAL_TABLET | Freq: Every day | ORAL | 3 refills | Status: DC

## 2016-10-05 MED ORDER — VALSARTAN 80 MG PO TABS: ORAL_TABLET | ORAL | 11 refills | Status: DC

## 2016-10-05 MED ORDER — TADALAFIL 20 MG PO TABS: 20.0000 mg | ORAL_TABLET | Freq: Every day | ORAL | 1 refills | Status: DC | PRN

## 2016-10-05 MED ORDER — AMLODIPINE BESYLATE 10 MG PO TABS: 10.0000 mg | ORAL_TABLET | Freq: Every day | ORAL | 11 refills | Status: DC

## 2016-10-05 NOTE — Progress Notes (Signed)
Cardiology Office Note Date:  10/05/2016   ID:  Randall Reed., DOB 1971/01/23, MRN [id number]  PCP:  Leandrew Koyanagi, MD (Inactive)  Cardiologist:  Sherren Mocha, MD    Chief Complaint  Patient presents with  . benign hypertension     History of Present Illness: Randall Reed. is a 45 y.o. male who presents for cardiology follow-up. The patient is been evaluated in the past for chest pain and tachy-palpitations. In 2013 he underwent a cardiac CTA demonstrating no coronary artery disease. In 2015 he underwent an exercise treadmill study which showed no evidence of ischemia with good exercise tolerance at 12 minutes on the Bruce protocol. When he was seen last year his blood pressure was elevated and antihypertensive medications were increased.  He complains of chest pain and tightness with high-level exertion. Feels a tightness in his left chest, eases with rest. No symptoms with lower level exertion. Symptoms occur with heart rate > 160 bpm.   He had dropped weight to 222# but has gained 25# over the last 3-4 months.   Past Medical History:  Diagnosis Date  . Anxiety   . Arthritis   . Asthma   . Depression   . GERD (gastroesophageal reflux disease)   . Seasonal allergic reaction     Past Surgical History:  Procedure Laterality Date  . none    . none      Current Outpatient Prescriptions  Medication Sig Dispense Refill  . acetic acid-hydrocortisone (VOSOL-HC) otic solution Place 3 drops into both ears 3 (three) times daily. Use as needed to prevent swimmer's ear 10 mL 5  . albuterol (PROVENTIL HFA;VENTOLIN HFA) 108 (90 Base) MCG/ACT inhaler Inhale 2 puffs into the lungs every 6 (six) hours as needed for wheezing or shortness of breath. 1 Inhaler 11  . ALPRAZolam (XANAX) 1 MG tablet Take 1 tablet (1 mg total) by mouth 3 (three) times daily as needed for anxiety. 30 tablet 5  . amLODipine (NORVASC) 10 MG tablet Take 1 tablet (10 mg total) by mouth daily. 30  tablet 11  . aspirin EC 81 MG tablet Take 1 tablet (81 mg total) by mouth daily.    . cyclobenzaprine (FLEXERIL) 10 MG tablet Take 1 tablet (10 mg total) by mouth 3 (three) times daily as needed for muscle spasms. 30 tablet 11  . diclofenac (VOLTAREN) 75 MG EC tablet Take 75 mg by mouth 2 (two) times daily as needed (pain).    Marland Kitchen dicyclomine (BENTYL) 20 MG tablet Take 1 tablet (20 mg total) by mouth every 6 (six) hours. As needed for symptoms 90 tablet 5  . emtricitabine-tenofovir (TRUVADA) 200-300 MG tablet Take 1 tablet by mouth daily. 30 tablet 11  . esomeprazole (NEXIUM) 40 MG capsule Take 1 capsule (40 mg total) by mouth daily before breakfast. 30 capsule 11  . Fexofenadine HCl (ALLEGRA PO) Take 1 tablet by mouth daily.     . fluticasone (FLONASE) 50 MCG/ACT nasal spray INSTILL 2 SPRAYS IN EACH NOSTRIL DAILY 16 g 10  . Fluticasone-Salmeterol (ADVAIR) 250-50 MCG/DOSE AEPB Inhale 1 puff into the lungs 2 (two) times daily as needed (allergies/wheezing/sob).    . hydrocortisone (ANUSOL-HC) 2.5 % rectal cream Place 1 application rectally 2 (two) times daily.    Marland Kitchen ipratropium (ATROVENT HFA) 17 MCG/ACT inhaler INHALE 2 PUFFS INTO THE LUNGS TWICE DAILY OR AS DIRECTED 12.9 g 11  . ipratropium (ATROVENT) 0.06 % nasal spray Place 2 sprays into the nose 2 (two)  times daily. 15 mL 9  . ketoprofen (ORUDIS) 75 MG capsule Take 1 capsule (75 mg total) by mouth 4 (four) times daily as needed for mild pain. 30 capsule 11  . montelukast (SINGULAIR) 10 MG tablet Take 1 tablet (10 mg total) by mouth at bedtime. 30 tablet 11  . pantoprazole (PROTONIX) 40 MG tablet Take 1 tablet (40 mg total) by mouth daily. 90 tablet 0  . rizatriptan (MAXALT-MLT) 10 MG disintegrating tablet TAKE AS DIRECTED 9 tablet 11  . tadalafil (CIALIS) 20 MG tablet Take 1 tablet (20 mg total) by mouth daily as needed for erectile dysfunction. 8 tablet 1  . valsartan (DIOVAN) 80 MG tablet TAKE 1 TABLET(80 MG) BY MOUTH DAILY 30 tablet 11  .  zolpidem (AMBIEN) 10 MG tablet Take 1 tablet (10 mg total) by mouth at bedtime. 30 tablet 5   No current facility-administered medications for this visit.     Allergies:   Desloratadine; Hydrocod polst-cpm polst er; Loratadine; Tussionex pennkinetic er [employer] er]; Levbid [hyoscyamine sulfate]; and Telithromycin   Social History:  The patient  reports that he has never smoked. He has never used smokeless tobacco. He reports that he drinks about 1.8 oz of alcohol per week . He reports that he does not use drugs.   Family History:  The patient's  family history includes Coronary artery disease in his paternal grandfather; Coronary artery disease (age of onset: 22) in his father; Heart attack in his father and maternal grandfather.    ROS:  Please see the history of present illness.  Otherwise, review of systems is positive for Weight gain, chest pain, dizziness, headaches.  All other systems are reviewed and negative.    PHYSICAL EXAM: VS:  BP 124/80   Pulse 70   Ht 5\' 11"  (1.803 m)   Wt 247 lb 6.4 oz (112.2 kg)   BMI 34.51 kg/m  , BMI Body mass index is 34.51 kg/m. GEN: Well nourished, well developed, in no acute distress  HEENT: normal  Neck: no JVD, no masses. No carotid bruits Cardiac: RRR without murmur or gallop                Respiratory:  clear to auscultation bilaterally, normal work of breathing GI: soft, nontender, nondistended, + BS MS: no deformity or atrophy  Ext: no pretibial edema, pedal pulses 2+= bilaterally Skin: warm and dry, no rash Neuro:  Strength and sensation are intact Psych: euthymic mood, full affect  EKG:  EKG is ordered today. The ekg ordered today shows normal sinus rhythm 69 bpm, within normal limits.  Recent Labs: 11/18/2015: TSH 1.502 04/20/2016: ALT 23; BUN 15; Creat 1.08; Hemoglobin 16.5; Platelets 206; Potassium 4.0; Sodium 137   Lipid Panel     Component Value Date/Time   CHOL 156 11/18/2015 1657   TRIG 176 (H)  11/18/2015 1657   HDL 31 (L) 11/18/2015 1657   CHOLHDL 5.0 11/18/2015 1657   VLDL 35 (H) 11/18/2015 1657   LDLCALC 90 11/18/2015 1657      Wt Readings from Last 3 Encounters:  10/05/16 247 lb 6.4 oz (112.2 kg)  04/20/16 230 lb (104.3 kg)  02/04/16 247 lb (112 kg)     Cardiac Studies Reviewed: Cardiac CTA 4.12.2013 IMPRESSION: 1. No evidence of significant coronary artery disease.  Patient's coronary artery calcium score is zero. 2.  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  A short portion of  the mid LAD extends into an intracavitary position within the right ventricle. This finding is benign, and typically of no clinical significance.  One caveat is if the patient should ever require pacemaker lead placement within the right ventricle, the position of the marked intracavitary portion of the mid LAD should be noted. 3.  No acute findings in the visualized thorax to account for the patient's symptoms. 4.  Right coronary artery dominance.  ASSESSMENT AND PLAN: 1.  Exertional chest pain: The patient is experiencing typical symptoms of angina at high level exertion. He does not have a history of obstructive coronary artery disease and in fact underwent a gated cardiac CTA in 2013 demonstrating normal coronary arteries. Considering his symptoms, I think evaluation with an exercise stress echocardiogram is indicated.  2. Essential hypertension, uncontrolled: Recommend increase amlodipine to 10 mg. Continue to follow blood pressure. We spent extensive time discussing the need for exercise and weight loss. His blood pressure is much better controlled when he is in the 220 pound range compared to his current weight approaching 250 pounds.  3. Erectile dysfunction: Cialis prescription updated.  Current medicines are reviewed with the patient today.  The patient does not have concerns regarding  medicines.  Labs/ tests ordered today include:   Orders Placed This Encounter  Procedures  . EKG 12-Lead  . ECHOCARDIOGRAM STRESS TEST    Disposition:   FU one year  Signed, Sherren Mocha, MD  10/05/2016 2:27 PM    [address], [place], [place]  [address] Phone: [phone]; Fax: [phone]

## 2016-10-05 NOTE — Patient Instructions (Signed)
Medication Instructions:  Your physician has recommended you make the following change in your medication:  1. INCREASE Amlodipine to 10mg  take one tablet by mouth daily  Labwork: No new orders.  Testing/Procedures: Your physician has requested that you have a stress echocardiogram. For further information please visit [email or web address]. Please follow instruction sheet as given.  Follow-Up: Your physician wants you to follow-up in: 1 YEAR with Dr Burt Knack.  You will receive a reminder letter in the mail two months in advance. If you don't receive a letter, please call our office to schedule the follow-up appointment.   Any Other Special Instructions Will Be Listed Below (If Applicable).     If you need a refill on your cardiac medications before your next appointment, please call your pharmacy.

## 2016-10-15 DIAGNOSIS — M47816 Spondylosis without myelopathy or radiculopathy, lumbar region: Secondary | ICD-10-CM | POA: Diagnosis not present

## 2016-10-15 DIAGNOSIS — Z1389 Encounter for screening for other disorder: Secondary | ICD-10-CM | POA: Diagnosis not present

## 2016-10-15 DIAGNOSIS — M545 Low back pain: Secondary | ICD-10-CM | POA: Diagnosis not present

## 2016-10-16 ENCOUNTER — Encounter: Payer: Self-pay | Admitting: Cardiovascular Disease

## 2016-10-16 DIAGNOSIS — I10 Essential (primary) hypertension: Secondary | ICD-10-CM | POA: Diagnosis not present

## 2016-10-16 DIAGNOSIS — E230 Hypopituitarism: Secondary | ICD-10-CM | POA: Diagnosis not present

## 2016-10-16 DIAGNOSIS — E669 Obesity, unspecified: Secondary | ICD-10-CM | POA: Diagnosis not present

## 2016-10-16 DIAGNOSIS — Z5181 Encounter for therapeutic drug level monitoring: Secondary | ICD-10-CM | POA: Diagnosis not present

## 2016-10-16 DIAGNOSIS — Z131 Encounter for screening for diabetes mellitus: Secondary | ICD-10-CM | POA: Diagnosis not present

## 2016-10-16 DIAGNOSIS — R635 Abnormal weight gain: Secondary | ICD-10-CM | POA: Diagnosis not present

## 2016-10-23 DIAGNOSIS — M9904 Segmental and somatic dysfunction of sacral region: Secondary | ICD-10-CM | POA: Diagnosis not present

## 2016-10-23 DIAGNOSIS — M9903 Segmental and somatic dysfunction of lumbar region: Secondary | ICD-10-CM | POA: Diagnosis not present

## 2016-10-23 DIAGNOSIS — M9905 Segmental and somatic dysfunction of pelvic region: Secondary | ICD-10-CM | POA: Diagnosis not present

## 2016-10-23 DIAGNOSIS — M532X7 Spinal instabilities, lumbosacral region: Secondary | ICD-10-CM | POA: Diagnosis not present

## 2016-10-24 DIAGNOSIS — M9903 Segmental and somatic dysfunction of lumbar region: Secondary | ICD-10-CM | POA: Diagnosis not present

## 2016-10-24 DIAGNOSIS — M532X7 Spinal instabilities, lumbosacral region: Secondary | ICD-10-CM | POA: Diagnosis not present

## 2016-10-24 DIAGNOSIS — M9905 Segmental and somatic dysfunction of pelvic region: Secondary | ICD-10-CM | POA: Diagnosis not present

## 2016-10-24 DIAGNOSIS — M9904 Segmental and somatic dysfunction of sacral region: Secondary | ICD-10-CM | POA: Diagnosis not present

## 2016-10-25 ENCOUNTER — Other Ambulatory Visit: Payer: Self-pay

## 2016-10-25 ENCOUNTER — Telehealth (HOSPITAL_COMMUNITY): Payer: Self-pay | Admitting: *Deleted

## 2016-10-25 MED ORDER — ALPRAZOLAM 1 MG PO TABS: 1.0000 mg | ORAL_TABLET | Freq: Three times a day (TID) | ORAL | 0 refills | Status: DC | PRN

## 2016-10-25 MED ORDER — ZOLPIDEM TARTRATE 10 MG PO TABS: 10.0000 mg | ORAL_TABLET | Freq: Every day | ORAL | 0 refills | Status: DC

## 2016-10-25 NOTE — Telephone Encounter (Signed)
Last ov 04/2016 and refill with 5 additional.

## 2016-10-25 NOTE — Telephone Encounter (Signed)
This patient needs an OV and to establish with a new PCP as Dr. Laney Pastor has retired. I am authorizing a 30-day supply of each medication, but he needs a visit before any additional refills will be authorized.  Meds ordered this encounter  Medications  . zolpidem (AMBIEN) 10 MG tablet    Sig: Take 1 tablet (10 mg total) by mouth at bedtime.    Dispense:  30 tablet    Refill:  0  . ALPRAZolam (XANAX) 1 MG tablet    Sig: Take 1 tablet (1 mg total) by mouth 3 (three) times daily as needed for anxiety.    Dispense:  30 tablet    Refill:  0

## 2016-10-25 NOTE — Telephone Encounter (Signed)
Sent to pharm [facility] [address] and tx up front for an appt.

## 2016-10-25 NOTE — Telephone Encounter (Signed)
Patient given detailed instructions per Stress Test Requisition Sheet for test on 10/30/16 at 2:30.Patient Notified to arrive 30 minutes early, and that it is imperative to arrive on time for appointment to keep from having the test rescheduled.  Patient verbalized understanding. Randall Reed

## 2016-10-30 ENCOUNTER — Ambulatory Visit (HOSPITAL_BASED_OUTPATIENT_CLINIC_OR_DEPARTMENT_OTHER): Payer: BLUE CROSS/BLUE SHIELD

## 2016-10-30 ENCOUNTER — Ambulatory Visit (HOSPITAL_COMMUNITY): Payer: BLUE CROSS/BLUE SHIELD | Attending: Internal Medicine

## 2016-10-30 DIAGNOSIS — I10 Essential (primary) hypertension: Secondary | ICD-10-CM | POA: Diagnosis not present

## 2016-10-30 DIAGNOSIS — I208 Other forms of angina pectoris: Secondary | ICD-10-CM | POA: Diagnosis not present

## 2016-10-30 DIAGNOSIS — Z5181 Encounter for therapeutic drug level monitoring: Secondary | ICD-10-CM | POA: Diagnosis not present

## 2016-10-30 DIAGNOSIS — Z6834 Body mass index (BMI) 34.0-34.9, adult: Secondary | ICD-10-CM | POA: Insufficient documentation

## 2016-10-30 DIAGNOSIS — F419 Anxiety disorder, unspecified: Secondary | ICD-10-CM | POA: Insufficient documentation

## 2016-10-30 DIAGNOSIS — E669 Obesity, unspecified: Secondary | ICD-10-CM | POA: Insufficient documentation

## 2016-11-01 ENCOUNTER — Telehealth: Payer: Self-pay | Admitting: Cardiovascular Disease

## 2016-11-01 DIAGNOSIS — M9904 Segmental and somatic dysfunction of sacral region: Secondary | ICD-10-CM | POA: Diagnosis not present

## 2016-11-01 DIAGNOSIS — M9903 Segmental and somatic dysfunction of lumbar region: Secondary | ICD-10-CM | POA: Diagnosis not present

## 2016-11-01 DIAGNOSIS — M532X7 Spinal instabilities, lumbosacral region: Secondary | ICD-10-CM | POA: Diagnosis not present

## 2016-11-01 DIAGNOSIS — M9905 Segmental and somatic dysfunction of pelvic region: Secondary | ICD-10-CM | POA: Diagnosis not present

## 2016-11-01 NOTE — Telephone Encounter (Signed)
New message   Pt verbalized that he is returning call for rn of Dr.Cooper

## 2016-11-01 NOTE — Telephone Encounter (Signed)
-----   Message from Sherren Mocha, MD sent at 10/31/2016  6:47 AM EST ----- Excellent stress test result.

## 2016-11-01 NOTE — Telephone Encounter (Signed)
Informed patient of results and verbal understanding expressed.  

## 2016-11-06 DIAGNOSIS — M9905 Segmental and somatic dysfunction of pelvic region: Secondary | ICD-10-CM | POA: Diagnosis not present

## 2016-11-06 DIAGNOSIS — M9903 Segmental and somatic dysfunction of lumbar region: Secondary | ICD-10-CM | POA: Diagnosis not present

## 2016-11-06 DIAGNOSIS — M9904 Segmental and somatic dysfunction of sacral region: Secondary | ICD-10-CM | POA: Diagnosis not present

## 2016-11-06 DIAGNOSIS — M532X7 Spinal instabilities, lumbosacral region: Secondary | ICD-10-CM | POA: Diagnosis not present

## 2016-11-12 DIAGNOSIS — M9905 Segmental and somatic dysfunction of pelvic region: Secondary | ICD-10-CM | POA: Diagnosis not present

## 2016-11-12 DIAGNOSIS — M9904 Segmental and somatic dysfunction of sacral region: Secondary | ICD-10-CM | POA: Diagnosis not present

## 2016-11-12 DIAGNOSIS — M532X7 Spinal instabilities, lumbosacral region: Secondary | ICD-10-CM | POA: Diagnosis not present

## 2016-11-12 DIAGNOSIS — M9903 Segmental and somatic dysfunction of lumbar region: Secondary | ICD-10-CM | POA: Diagnosis not present

## 2016-11-16 ENCOUNTER — Ambulatory Visit: Payer: BLUE CROSS/BLUE SHIELD | Admitting: Internal Medicine

## 2016-11-21 DIAGNOSIS — M9904 Segmental and somatic dysfunction of sacral region: Secondary | ICD-10-CM | POA: Diagnosis not present

## 2016-11-21 DIAGNOSIS — M9903 Segmental and somatic dysfunction of lumbar region: Secondary | ICD-10-CM | POA: Diagnosis not present

## 2016-11-21 DIAGNOSIS — M9905 Segmental and somatic dysfunction of pelvic region: Secondary | ICD-10-CM | POA: Diagnosis not present

## 2016-11-21 DIAGNOSIS — M532X7 Spinal instabilities, lumbosacral region: Secondary | ICD-10-CM | POA: Diagnosis not present

## 2016-11-22 ENCOUNTER — Ambulatory Visit: Payer: BLUE CROSS/BLUE SHIELD | Admitting: Family Medicine

## 2016-11-23 ENCOUNTER — Other Ambulatory Visit: Payer: Self-pay

## 2016-11-23 NOTE — Telephone Encounter (Signed)
Pt is needing a refill on ambien had an appt on Thursday but we were closed  Best number [phone]

## 2016-11-24 MED ORDER — ZOLPIDEM TARTRATE 10 MG PO TABS: 10.0000 mg | ORAL_TABLET | Freq: Every day | ORAL | 0 refills | Status: DC

## 2016-11-24 NOTE — Telephone Encounter (Signed)
10/25/16 last refill Needs to reschedule appt.

## 2016-11-24 NOTE — Telephone Encounter (Signed)
Meds ordered this encounter  Medications  . zolpidem (AMBIEN) 10 MG tablet    Sig: Take 1 tablet (10 mg total) by mouth at bedtime.    Dispense:  30 tablet    Refill:  0    Please help him reschedule his appointment.

## 2016-11-26 NOTE — Telephone Encounter (Signed)
Called to pharmacy needs ov

## 2016-11-28 DIAGNOSIS — M9905 Segmental and somatic dysfunction of pelvic region: Secondary | ICD-10-CM | POA: Diagnosis not present

## 2016-11-28 DIAGNOSIS — M532X7 Spinal instabilities, lumbosacral region: Secondary | ICD-10-CM | POA: Diagnosis not present

## 2016-11-28 DIAGNOSIS — M9903 Segmental and somatic dysfunction of lumbar region: Secondary | ICD-10-CM | POA: Diagnosis not present

## 2016-11-28 DIAGNOSIS — M9904 Segmental and somatic dysfunction of sacral region: Secondary | ICD-10-CM | POA: Diagnosis not present

## 2016-11-29 ENCOUNTER — Ambulatory Visit (INDEPENDENT_AMBULATORY_CARE_PROVIDER_SITE_OTHER): Payer: BLUE CROSS/BLUE SHIELD | Admitting: Family Medicine

## 2016-11-29 ENCOUNTER — Encounter: Payer: Self-pay | Admitting: Family Medicine

## 2016-11-29 VITALS — BP 130/89 | HR 99 | Temp 99.1°F | Ht 71.0 in | Wt 252.8 lb

## 2016-11-29 DIAGNOSIS — G4700 Insomnia, unspecified: Secondary | ICD-10-CM

## 2016-11-29 DIAGNOSIS — J45909 Unspecified asthma, uncomplicated: Secondary | ICD-10-CM | POA: Diagnosis not present

## 2016-11-29 DIAGNOSIS — I10 Essential (primary) hypertension: Secondary | ICD-10-CM

## 2016-11-29 DIAGNOSIS — G43109 Migraine with aura, not intractable, without status migrainosus: Secondary | ICD-10-CM

## 2016-11-29 DIAGNOSIS — J309 Allergic rhinitis, unspecified: Secondary | ICD-10-CM

## 2016-11-29 DIAGNOSIS — F411 Generalized anxiety disorder: Secondary | ICD-10-CM | POA: Diagnosis not present

## 2016-11-29 DIAGNOSIS — K219 Gastro-esophageal reflux disease without esophagitis: Secondary | ICD-10-CM

## 2016-11-29 DIAGNOSIS — K580 Irritable bowel syndrome with diarrhea: Secondary | ICD-10-CM

## 2016-11-29 DIAGNOSIS — K644 Residual hemorrhoidal skin tags: Secondary | ICD-10-CM | POA: Diagnosis not present

## 2016-11-29 MED ORDER — IPRATROPIUM BROMIDE 0.06 % NA SOLN: 2.0000 | Freq: Two times a day (BID) | NASAL | 9 refills | Status: DC

## 2016-11-29 MED ORDER — ALPRAZOLAM 1 MG PO TABS: 1.0000 mg | ORAL_TABLET | Freq: Three times a day (TID) | ORAL | 0 refills | Status: DC | PRN

## 2016-11-29 MED ORDER — DICYCLOMINE HCL 20 MG PO TABS: 20.0000 mg | ORAL_TABLET | Freq: Four times a day (QID) | ORAL | 2 refills | Status: DC

## 2016-11-29 MED ORDER — HYDROCORTISONE 2.5 % RE CREA: 1.0000 "application " | TOPICAL_CREAM | Freq: Two times a day (BID) | RECTAL | 2 refills | Status: DC

## 2016-11-29 NOTE — Patient Instructions (Addendum)
For asthma - if frequent use of albuterol, you may want to switch to a daily inhaled steroid instead of episodic Advair. During allergy season, would recommend taking Advair everyday twice per day, or we could prescibed just an inhaled steroid each day.     No change in allergy meds for now.   For anxiety - recommended treatment with daily SSRI, to lessen need for xanax.  I also would recommend against combining Xanax and Ambien as both can cause sedation and additive side effects. I would recommend repeat sleep study, and this will need to be done in next few months. Let me know when I can place that referral.   I would recommend follow up with gastroenterologist as you may need other testing such as a EGD, especially if you are requiring a PPI everyday or twice per day.   Return in next few months to discuss Truvada and necessary monitoring tests.   Based on frequency of migraines and frequency of Maxalt and Ketoprofen use I would recommend meeting with neurologist to discuss preventative/daily med. I will refer you to [place].   If you are running out of a med prior to follow up in next 3 months, let me know and I can likely refill it until that time.   Follow up in March if possible.    IF you received an x-ray today, you will receive an invoice from [facility]. Please contact [facility] at [phone] with questions or concerns regarding your invoice.   IF you received labwork today, you will receive an invoice from [facility]. Please contact [facility] at [phone] with questions or concerns regarding your invoice.   Our billing staff will not be able to assist you with questions regarding bills from these companies.  You will be contacted with the lab results as soon as they are available. The fastest way to get your results is to activate your My Chart account. Instructions are located on the last page of this paperwork. If you have not heard from  [place] regarding the results in 2 weeks, please contact this office.

## 2016-11-29 NOTE — Progress Notes (Addendum)
By signing my name below, I, Mesha Guinyard, attest that this documentation has been prepared under the direction and in the presence of [name], MD.  Electronically Signed: Verlee Monte, Medical Scribe. 11/29/16. 4:57 PM.  Subjective:    Patient ID: Randall Apple., male    DOB: 1971-09-22, 46 y.o.   MRN: [id number]  HPI Chief Complaint  Patient presents with  . Medication Refill    HPI Comments: Randall Thivierge. is a 46 y.o. male who presents to the [facility] for medication refill. PMHx of multiple medical problems including HTN, GERD, GAD with insomnia, erectile dysfunction, asthma, IBS, and migraines. He is establishing care with me as previous primary provider was Dr. Laney Pastor who retired. Last visit with Dr. Laney Pastor June 2017, multiple issues addressed at that time and medication refills provided at that time.  Asthma: Increase use of advair reported at June visit.; he was continued on 250/50 mg dose BID and albuterol inhaler PRN. He had 1 albuterol inhaler with 11 refills at that visit, as well as 11 refills of his advair, additionally he was Rx atrovent inhaler PRN and singular 10 mg QHS. Pt takes advair for bronchitis, and intermittent dosing 1-2x a month, but more frequent during allergy season.  He tries to stay away from advair it because it causes thrush, but will use it and atrovent inhaler, albuterol inhaler as needed. He uses albuterol 1-2x every other week, PRN, when exercising or based off of what he's doing for the week.   IBS: He has been treated with bentyl 20 mg Q 6 hours #30 with 11 refills Rx June 2017. Pt's last EGD on file is in 2003. Pt is not currently followed by GI. He takes bentyl BID to TID depending on the severity to prevent diarrhea IBS. Pt met with GI at [facility] with his last visit 4 years ago - suspects he saw Dr. [name]. Occasional hemorrhoids when diarrhea falres - uses Anusol HC if needed.   Allergic Rhinitis: He is on  singular as wall as allegra and flonase. Takes singular at night, flonase during the day as well as allegra. Pt uses nasal atrovent ns, and atrovent inhaler PRN for severe seasonal allergies.  HTN: He takes Diovan 80 mg QD and norvasc 10 mg QD. Recent office visits were reviewed with cardiology, [name] with exertional chest pain. He was complaining of chest tightness with high level of exertion- amlodipine was increased at [place] visit. He had a stress echo on Dec 26th that was reported an excellent stress test results - no concerning findings.  BP Readings from Last 3 Encounters:  11/29/16 130/89  10/05/16 124/80  04/20/16 130/88   Lab Results  Component Value Date   CREATININE 1.08 04/20/2016   Anxiety with Insomnia: He has been treated with xanax 1 mg up to TID PRN. Initially given #30 with 5 refills on June 16th - this was most recently refilled on Dec 31st for #30. He was also treated with ambien  #30 of 10 mg with 5 refills at June 16th visit. This was most recently refilled for #30 on Jan 20th. Pt takes xanax BID and occasionally TID that is normally paired with ambien QHS. Pt frequently wakes up and gets a maximum of 7 hours of sleep. Pt had a sleep study when he was 300 lbs, and as his weight went down, his snoring decreased. Has not had repeat sleep study.  Pt is followed by Dr. Buddy Duty  for testosterone (dosed every 2 weeks). Pt has not seen psychiatry re: anxiety and tried SSRI possibly once in past but not on SSRI currently. Pt has not picked up his Rx of ambien yet from 5 d ago. Pt rarely drinks alcohol, no increased use.  Denies SI, thoughts of self harm. Later in visit, discussed he may not take xanax everyday, but moreso when increased stress. Has also had teeth grinding in past that used xanax at night to treat.   HIV Prevention: Pt takes truvada for HIV prevention. Denies new STI exposure/unprotected intercourse.   External Hemorrhoid: Occurs when his stomach is upset/frequent  diarrhea. Uses hydrocortisone cream for relief as above.   GERD: On protonix and nexium. Pt takes nexium in the morning and protonix at night. Pt last endoscopy was over 4 years ago. Pt saw Dr. [name] at [place]. Not sure of why on 2 different PPI's but may have been for insurance reasons. Stable on this dosing, but not tried QD dosing recently.   Migraine/HAs: He has been treated with maxalt 10 mg PRN - #9 with 11 refills in June 2017. Takes ketoprofen when his migraines are begining and if it doesn't work he'll take ONEOK. He's had 5-6 migraines this month - more frequent than usual, but still multiple per month.  Suspects his migraines are stress related since after getting his neck popped at his chiropractor yesterday, his HA immediately resolved. Pt plans to focus on the tension in his neck. Pt is not followed by neurology recently but saw Dr. Earley Favor in past.   Back Pain: Reports having muscle spasms in his back and takes flexeril for relief of his sxs. sae sx's as in past - no weakness or new symptoms.   Right Ear Soreness: Reports waking up with a soreness in his ear and mentions he always has problems with that ear. No fever or discharge.   Diarrhea - with IBS, using Bentyl with overall stable sx's, but still persistent need of Bentyl.   Patient Active Problem List   Diagnosis Date Noted  . Irritable bowel syndrome 03/14/2016  . Asthma 11/19/2015  . 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 rotator cuff tear 10/17/2013  . Hypogonadism male 09/04/2013  . ED (erectile dysfunction) 07/30/2013  . Palpitations 02/21/2012  . BMI 33.0-33.9,adult 02/04/2012  . Allergic rhinitis 02/04/2012  . GERD (gastroesophageal reflux disease) 02/04/2012  . Migraine 02/04/2012  . GAD (generalized anxiety disorder) 02/04/2012  . Insomnia 02/04/2012  . HYPERTENSION, BENIGN 04/13/2009  . CHEST PAIN-UNSPECIFIED 04/13/2009   Past Medical History:  Diagnosis Date  . Anxiety   . Arthritis   .  Asthma   . Depression   . GERD (gastroesophageal reflux disease)   . Seasonal allergic reaction    Past Surgical History:  Procedure Laterality Date  . none    . none     Allergies  Allergen Reactions  . Desloratadine Other (See Comments)    CLARINEX-"severe headache"  . Hydrocod Polst-Cpm Polst Er Itching  . Loratadine Other (See Comments)    "severe headache"  . Tussionex Pennkinetic Er [Hydrocod Polst-Cpm Polst Er] Itching  . Levbid [Hyoscyamine Sulfate] Rash  . Telithromycin Rash   Prior to Admission medications   Medication Sig Start Date End Date Taking? Authorizing Provider  acetic acid-hydrocortisone (VOSOL-HC) otic solution Place 3 drops into both ears 3 (three) times daily. Use as needed to prevent swimmer's ear 01/10/15  Yes Leandrew Koyanagi, MD  albuterol (PROVENTIL HFA;VENTOLIN HFA) 108 (90 Base)  MCG/ACT inhaler Inhale 2 puffs into the lungs every 6 (six) hours as needed for wheezing or shortness of breath. 04/20/16  Yes Leandrew Koyanagi, MD  ALPRAZolam [name]) 1 MG tablet Take 1 tablet (1 mg total) by mouth 3 (three) times daily as needed for anxiety. 10/25/16  Yes Chelle Jeffery, PA-C  amLODipine (NORVASC) 10 MG tablet Take 1 tablet (10 mg total) by mouth daily. 10/05/16  Yes Sherren Mocha, MD  aspirin EC 81 MG tablet Take 1 tablet (81 mg total) by mouth daily. 03/07/15  Yes Sherren Mocha, MD  cyclobenzaprine (FLEXERIL) 10 MG tablet Take 1 tablet (10 mg total) by mouth 3 (three) times daily as needed for muscle spasms. 04/20/16  Yes Leandrew Koyanagi, MD  diclofenac (VOLTAREN) 75 MG EC tablet Take 75 mg by mouth 2 (two) times daily as needed (pain).   Yes Historical Provider, MD  dicyclomine (BENTYL) 20 MG tablet Take 1 tablet (20 mg total) by mouth every 6 (six) hours. As needed for symptoms 05/09/16  Yes Leandrew Koyanagi, MD  emtricitabine-tenofovir (TRUVADA) 200-300 MG tablet Take 1 tablet by mouth daily. 04/20/16  Yes Leandrew Koyanagi, MD  esomeprazole (NEXIUM) 40  MG capsule Take 1 capsule (40 mg total) by mouth daily before breakfast. 04/25/15  Yes Leandrew Koyanagi, MD  Fexofenadine HCl (ALLEGRA PO) Take 1 tablet by mouth daily.    Yes Historical Provider, MD  fluticasone (FLONASE) 50 MCG/ACT nasal spray INSTILL 2 SPRAYS IN EACH NOSTRIL DAILY 04/20/16  Yes Leandrew Koyanagi, MD  Fluticasone-Salmeterol (ADVAIR) 250-50 MCG/DOSE AEPB Inhale 1 puff into the lungs 2 (two) times daily as needed (allergies/wheezing/sob).   Yes Historical Provider, MD  hydrocortisone (ANUSOL-HC) 2.5 % rectal cream Place 1 application rectally 2 (two) times daily.   Yes Historical Provider, MD  ipratropium (ATROVENT HFA) 17 MCG/ACT inhaler INHALE 2 PUFFS INTO THE LUNGS TWICE DAILY OR AS DIRECTED 04/20/16  Yes Leandrew Koyanagi, MD  ipratropium (ATROVENT) 0.06 % nasal spray Place 2 sprays into the nose 2 (two) times daily. 11/18/15  Yes Leandrew Koyanagi, MD  ketoprofen (ORUDIS) 75 MG capsule Take 1 capsule (75 mg total) by mouth 4 (four) times daily as needed for mild pain. 04/20/16  Yes Leandrew Koyanagi, MD  montelukast (SINGULAIR) 10 MG tablet Take 1 tablet (10 mg total) by mouth at bedtime. 04/20/16  Yes Leandrew Koyanagi, MD  pantoprazole (PROTONIX) 40 MG tablet Take 1 tablet (40 mg total) by mouth daily. 08/30/16  Yes Wardell Honour, MD  rizatriptan (MAXALT-MLT) 10 MG disintegrating tablet TAKE AS DIRECTED 04/20/16  Yes Leandrew Koyanagi, MD  tadalafil (CIALIS) 20 MG tablet Take 1 tablet (20 mg total) by mouth daily as needed for erectile dysfunction. 10/05/16  Yes Sherren Mocha, MD  valsartan (DIOVAN) 80 MG tablet TAKE 1 TABLET(80 MG) BY MOUTH DAILY 10/05/16  Yes Sherren Mocha, MD  zolpidem (AMBIEN) 10 MG tablet Take 1 tablet (10 mg total) by mouth at bedtime. 11/24/16  Yes Harrison Mons, PA-C   Social History   Social History  . Marital status: Single    Spouse name: N/A  . Number of children: 0  . Years of education: N/A   Occupational History  . [employer]  commission Oceanographer)    Social History Main Topics  . Smoking status: Never Smoker  . Smokeless tobacco: Never Used  . Alcohol use 1.8 oz/week    3 Standard drinks or equivalent per week  . Drug use:  No  . Sexual activity: Yes   Other Topics Concern  . Not on file   Social History Narrative  . No narrative on file   Review of Systems  HENT: Positive for ear pain.   Respiratory: Positive for shortness of breath (chronic with asthma) and wheezing (chronic with asthma).   Gastrointestinal: Positive for diarrhea (chronic with IBS).  Musculoskeletal: Positive for back pain and myalgias.  Allergic/Immunologic: Positive for environmental allergies. Negative for immunocompromised state.  Neurological: Positive for headaches.  Psychiatric/Behavioral: Positive for sleep disturbance. Negative for self-injury and suicidal ideas. The patient is nervous/anxious.    Objective:  Physical Exam  Constitutional: He appears well-developed and well-nourished. No distress.  HENT:  Head: Normocephalic and atraumatic.  Right Ear: Ear canal normal.  Minimal clear fluid behind right TM, canal clear, no erythema  Eyes: Conjunctivae are normal.  Neck: Neck supple.  Cardiovascular: Normal rate, regular rhythm and normal heart sounds.  Exam reveals no friction rub.   No murmur heard. Pulmonary/Chest: Effort normal and breath sounds normal. No respiratory distress. He has no wheezes. He has no rales.  Musculoskeletal: He exhibits no edema (Lower extremity).  Neurological: He is alert.  Skin: Skin is warm and dry.  Psychiatric: He has a normal mood and affect. His behavior is normal.  Nursing note and vitals reviewed.  BP 130/89 (BP Location: Right Arm, Patient Position: Sitting, Cuff Size: Large)   Pulse 99   Temp 99.1 F (37.3 C) (Oral)   Ht _0  (1.803 m)   Wt 252 lb 12.8 oz (114.7 kg)   SpO2 99%   BMI 35.26 kg/m  Assessment & Plan:  Over 40 minutes time of care including chart review  and review of prior records, as well as discussion of multiple medical problems and refills below.   Randall Glasscock. is a 46 y.o. male Uncomplicated asthma, unspecified asthma severity, unspecified whether persistent  - Appears to be prn dosing of multiple medications. Discussed ideal treatment of asthma may be with daily inhaled corticosteroid or inhale corticosteroid long-acting bronchodilator if needed during allergy season with when necessary albuterol use. Would also continue Singulair every day. Depending on need for albuterol inhaler and controlled during allergy season, can make decision on whether to continue Advair or trial inhaled corticosteroid only. Could also likely avoid Atrovent inhaler at this time as for asthma albuterol, Advair, albuterol should be sufficient. RTC precautions.  Allergic rhinitis, unspecified chronicity, unspecified seasonality, unspecified trigger - Plan: ipratropium (ATROVENT) 0.06 % nasal spray  - Overall stable with steroid nasal spray, Atrovent nasal spray, Allegra and Singulair. No changes at this time.  Irritable bowel syndrome with diarrhea - Plan: dicyclomine (BENTYL) 20 MG tablet, Ambulatory referral to Gastroenterology  - Still with intermittent symptoms and need for Bentyl fairly frequently. We'll refer to gastroenterology to discuss other medication options. Continue bentyl for now.   HYPERTENSION, BENIGN  - Stable on amlodipine and valsartan. No change in dose at this time. Will order future labs as no labs were obtained during visit as I wanted to check into necessary monitoring labs for his Truvada as below. Follow up in next 2 months.   GAD (generalized anxiety disorder) - Plan: ALPRAZolam (XANAX) 1 MG tablet Insomnia, unspecified type  - Initially appears to be persistent use of benzodiazepine, but then later stated it is only when necessary use and not using everyday. Still appears he may benefit from possible SSRI daily. This may also help  with frequency of migraine  headaches. Will refer to headache specialist to discuss migraine treatment, hold on SSRI for now, follow up in next 2 moths to discuss further.   -I discussed my concerns with use of benzodiazepine and Ambien at night. Recommended against combination of these 2 medicines. Additionally with history of sleep apnea, this may be a cause of some of his insomnia and frequent wakening and concern expressed of suppressing wakening if it was sleep apnea and need for appropriate treatment. Would recommend repeat sleep study, but he deferred at this time.  Gastroesophageal reflux disease, esophagitis presence not specified - Plan: Ambulatory referral to Gastroenterology  - Long-term need for PPI, as well as PPI twice a day. Not sure why he may be on 2 separate PPIs, but may have been due to previous insurance coverage. Would recommend PPI daily, avoidance of triggers, and only use PPI twice per day if needed for symptom relief. We'll also refer to gastroenterology as has not had recent evaluation and with this frequent of need for PPI, may need endoscopy to rule out Barrett's or other underlying issue.   Migraine with aura and without status migrainosus, not intractable - Plan: Ambulatory referral to Neurology  - More frequent migraines recently. Discussed likely need of daily medication for prevention. We'll refer to headache specialist to discuss treatment options, consider SSRI as above. ER/RTC precautions if worsening or atypical headaches. Okay to try ketoprofen OR Maxalt if needed for acute migraine.   External hemorrhoids - Plan: hydrocortisone (ANUSOL-HC) 2.5 % rectal cream  -Noted when diarrhea worse with IBS. Anusol HC was refilled.  HIV exposure prophylaxis.   - He has been continued on Truvada and tolerating the medication without reported new side effects. After visit, I reviewed information regarding preexposure prophylaxis and monitoring of the medication. Would recommend  that he have STI testing approximately every 3 months depending on exposure risk, and monitoring of renal function at the minimum every 6 months. We'll place lab only order for BMP, follow-up with me in 2 months as planned to discuss above issues and other medications if needed.  Meds ordered this encounter  Medications  . ALPRAZolam (XANAX) 1 MG tablet    Sig: Take 1 tablet (1 mg total) by mouth 3 (three) times daily as needed for anxiety.    Dispense:  30 tablet    Refill:  0  . dicyclomine (BENTYL) 20 MG tablet    Sig: Take 1 tablet (20 mg total) by mouth every 6 (six) hours. As needed for symptoms    Dispense:  90 tablet    Refill:  2  . hydrocortisone (ANUSOL-HC) 2.5 % rectal cream    Sig: Place 1 application rectally 2 (two) times daily.    Dispense:  30 g    Refill:  2  . ipratropium (ATROVENT) 0.06 % nasal spray    Sig: Place 2 sprays into the nose 2 (two) times daily.    Dispense:  15 mL    Refill:  9   Patient Instructions   For asthma - if frequent use of albuterol, you may want to switch to a daily inhaled steroid instead of episodic Advair. During allergy season, would recommend taking Advair everyday twice per day, or we could prescibed just an inhaled steroid each day.     No change in allergy meds for now.   For anxiety - recommended treatment with daily SSRI, to lessen need for xanax.  I also would recommend against combining Xanax and Ambien as both can  cause sedation and additive side effects. I would recommend repeat sleep study, and this will need to be done in next few months. Let me know when I can place that referral.   I would recommend follow up with gastroenterologist as you may need other testing such as a EGD, especially if you are requiring a PPI everyday or twice per day.   Return in next few months to discuss Truvada and necessary monitoring tests.   Based on frequency of migraines and frequency of Maxalt and Ketoprofen use I would recommend meeting  with neurologist to discuss preventative/daily med. I will refer you to [place].   If you are running out of a med prior to follow up in next 3 months, let me know and I can likely refill it until that time.   Follow up in March if possible.    IF you received an x-ray today, you will receive an invoice from [facility]. Please contact [facility] at [phone] with questions or concerns regarding your invoice.   IF you received labwork today, you will receive an invoice from [place]. Please contact [facility] at [phone] with questions or concerns regarding your invoice.   Our billing staff will not be able to assist you with questions regarding bills from these companies.  You will be contacted with the lab results as soon as they are available. The fastest way to get your results is to activate your My Chart account. Instructions are located on the last page of this paperwork. If you have not heard from [place] regarding the results in 2 weeks, please contact this office.        I personally performed the services described in this documentation, which was scribed in my presence. The recorded information has been reviewed and considered, and addended by me as needed.   Signed,   [name], MD [facility].  11/29/16 7:49 PM

## 2016-11-30 ENCOUNTER — Telehealth: Payer: Self-pay | Admitting: Family Medicine

## 2016-11-30 NOTE — Telephone Encounter (Signed)
Called patient to discuss some of issues from yesterday's visit and a few clarifications. Unable to reach so left a voicemail that I would be talking to him by phone or email within the next week.   I did check into monitoring for HIV prophylaxis with Truvada, and based on UpToDate, will need renal function every 6 months, and STI testing usually recommended every 3 months. I can also discuss this further with infectious disease locally if needed.

## 2016-12-02 ENCOUNTER — Encounter: Payer: Self-pay | Admitting: Family Medicine

## 2016-12-02 ENCOUNTER — Other Ambulatory Visit: Payer: Self-pay | Admitting: Physician Assistant

## 2016-12-02 DIAGNOSIS — Z5181 Encounter for therapeutic drug level monitoring: Secondary | ICD-10-CM

## 2016-12-02 DIAGNOSIS — I10 Essential (primary) hypertension: Secondary | ICD-10-CM

## 2016-12-02 MED ORDER — ZOLPIDEM TARTRATE 10 MG PO TABS: 10.0000 mg | ORAL_TABLET | Freq: Every day | ORAL | 0 refills | Status: DC

## 2016-12-02 NOTE — Telephone Encounter (Signed)
Rx printed, but needs to be faxed to [facility] in [place] (previous rx sent to [facility]) Thanks. -JG.

## 2016-12-03 NOTE — Telephone Encounter (Signed)
Called to [facility]

## 2016-12-03 NOTE — Telephone Encounter (Signed)
See other note. Faxed or called in to [facility].

## 2016-12-05 DIAGNOSIS — M9905 Segmental and somatic dysfunction of pelvic region: Secondary | ICD-10-CM | POA: Diagnosis not present

## 2016-12-05 DIAGNOSIS — M9903 Segmental and somatic dysfunction of lumbar region: Secondary | ICD-10-CM | POA: Diagnosis not present

## 2016-12-05 DIAGNOSIS — M532X7 Spinal instabilities, lumbosacral region: Secondary | ICD-10-CM | POA: Diagnosis not present

## 2016-12-05 DIAGNOSIS — M9904 Segmental and somatic dysfunction of sacral region: Secondary | ICD-10-CM | POA: Diagnosis not present

## 2016-12-07 ENCOUNTER — Other Ambulatory Visit: Payer: Self-pay | Admitting: Family Medicine

## 2016-12-07 DIAGNOSIS — Z131 Encounter for screening for diabetes mellitus: Secondary | ICD-10-CM

## 2016-12-07 DIAGNOSIS — Z113 Encounter for screening for infections with a predominantly sexual mode of transmission: Secondary | ICD-10-CM

## 2016-12-08 ENCOUNTER — Other Ambulatory Visit (INDEPENDENT_AMBULATORY_CARE_PROVIDER_SITE_OTHER): Payer: BLUE CROSS/BLUE SHIELD

## 2016-12-08 DIAGNOSIS — Z5181 Encounter for therapeutic drug level monitoring: Secondary | ICD-10-CM

## 2016-12-08 DIAGNOSIS — I10 Essential (primary) hypertension: Secondary | ICD-10-CM | POA: Diagnosis not present

## 2016-12-08 DIAGNOSIS — Z131 Encounter for screening for diabetes mellitus: Secondary | ICD-10-CM | POA: Diagnosis not present

## 2016-12-08 DIAGNOSIS — Z113 Encounter for screening for infections with a predominantly sexual mode of transmission: Secondary | ICD-10-CM | POA: Diagnosis not present

## 2016-12-09 LAB — BASIC METABOLIC PANEL
BUN/Creatinine Ratio: 9 (ref 9–20)
BUN: 11 mg/dL (ref 6–24)
CO2: 20 mmol/L (ref 18–29)
CREATININE: 1.23 mg/dL (ref 0.76–1.27)
Calcium: 9.3 mg/dL (ref 8.7–10.2)
Chloride: 103 mmol/L (ref 96–106)
GFR calc Af Amer: 80 mL/min/{1.73_m2} (ref >59)
GFR calc non Af Amer: 69 mL/min/{1.73_m2} (ref >59)
GLUCOSE: 79 mg/dL (ref 65–99)
Potassium: 4.6 mmol/L (ref 3.5–5.2)
Sodium: 142 mmol/L (ref 134–144)

## 2016-12-09 LAB — HIV ANTIBODY (ROUTINE TESTING W REFLEX): HIV SCREEN 4TH GENERATION: NONREACTIVE

## 2016-12-09 LAB — HEMOGLOBIN A1C
ESTIMATED AVERAGE GLUCOSE: 94 mg/dL
Hgb A1c MFr Bld: 4.9 % (ref 4.8–5.6)

## 2016-12-09 LAB — RPR: RPR: NONREACTIVE

## 2016-12-12 LAB — GC/CHLAMYDIA PROBE AMP
CHLAMYDIA, DNA PROBE: NEGATIVE
Neisseria gonorrhoeae by PCR: NEGATIVE

## 2016-12-26 DIAGNOSIS — M9905 Segmental and somatic dysfunction of pelvic region: Secondary | ICD-10-CM | POA: Diagnosis not present

## 2016-12-26 DIAGNOSIS — M532X7 Spinal instabilities, lumbosacral region: Secondary | ICD-10-CM | POA: Diagnosis not present

## 2016-12-26 DIAGNOSIS — M9904 Segmental and somatic dysfunction of sacral region: Secondary | ICD-10-CM | POA: Diagnosis not present

## 2016-12-26 DIAGNOSIS — M9903 Segmental and somatic dysfunction of lumbar region: Secondary | ICD-10-CM | POA: Diagnosis not present

## 2017-01-04 ENCOUNTER — Other Ambulatory Visit: Payer: Self-pay | Admitting: Family Medicine

## 2017-01-04 DIAGNOSIS — F411 Generalized anxiety disorder: Secondary | ICD-10-CM

## 2017-01-05 NOTE — Telephone Encounter (Signed)
Refilled. Keep follow up as planned to discuss meds.

## 2017-01-05 NOTE — Telephone Encounter (Signed)
Called to listed pharm with message to f/u

## 2017-01-05 NOTE — Telephone Encounter (Signed)
11/2016 last refills on both

## 2017-01-09 DIAGNOSIS — M9903 Segmental and somatic dysfunction of lumbar region: Secondary | ICD-10-CM | POA: Diagnosis not present

## 2017-01-09 DIAGNOSIS — M9905 Segmental and somatic dysfunction of pelvic region: Secondary | ICD-10-CM | POA: Diagnosis not present

## 2017-01-09 DIAGNOSIS — M9904 Segmental and somatic dysfunction of sacral region: Secondary | ICD-10-CM | POA: Diagnosis not present

## 2017-01-09 DIAGNOSIS — M532X7 Spinal instabilities, lumbosacral region: Secondary | ICD-10-CM | POA: Diagnosis not present

## 2017-01-17 ENCOUNTER — Ambulatory Visit (INDEPENDENT_AMBULATORY_CARE_PROVIDER_SITE_OTHER): Payer: BLUE CROSS/BLUE SHIELD | Admitting: Family Medicine

## 2017-01-17 ENCOUNTER — Encounter: Payer: Self-pay | Admitting: Family Medicine

## 2017-01-17 VITALS — BP 126/87 | HR 95 | Temp 98.9°F | Resp 18 | Ht 71.0 in | Wt 251.0 lb

## 2017-01-17 DIAGNOSIS — G4700 Insomnia, unspecified: Secondary | ICD-10-CM | POA: Diagnosis not present

## 2017-01-17 DIAGNOSIS — K219 Gastro-esophageal reflux disease without esophagitis: Secondary | ICD-10-CM

## 2017-01-17 DIAGNOSIS — G43809 Other migraine, not intractable, without status migrainosus: Secondary | ICD-10-CM | POA: Diagnosis not present

## 2017-01-17 DIAGNOSIS — J45909 Unspecified asthma, uncomplicated: Secondary | ICD-10-CM | POA: Diagnosis not present

## 2017-01-17 DIAGNOSIS — K580 Irritable bowel syndrome with diarrhea: Secondary | ICD-10-CM | POA: Diagnosis not present

## 2017-01-17 NOTE — Progress Notes (Signed)
By signing my name below, I, Randall Reed, attest that this documentation has been prepared under the direction and in the presence of [name], MD.  Electronically Signed: Verlee Monte, Medical Scribe. 01/17/17. 4:05 PM.  Subjective:    Patient ID: Randall Reed., male    DOB: 02-07-1971, 46 y.o.   MRN: [id number]  HPI Chief Complaint  Patient presents with  . Follow-up    continuation of appointment in January     HPI Comments: Randall Reed. is a 46 y.o. male who presents to the [facility] for follow-up. See previous office visit. Established with me as PCP from Dr. Laney Pastor. Multiple concerns addressed, however asked to return to discuss plan further for his insomnia, migraine HA, and reflux.   Insomnia: He has had a history of generalized anxiety, treated with ambien 10 mg QHS, and xanax up to 3x a day PRN. He occasionally takes that xanax 3x a day but usually BID. Possibly tried SSRI in the past but not recent. See pt email as he had concerns with change of plan as it took his previous provider a while to get a good regimen. We also discussed repeat sleep study as untreated sleep apnea may also be contributing, but he apparently did not tolerate this study in the past. See details from last visit. I did discuss concerns of combination of ambien and xanax at night, but he was tolerating that dosage at last visit. Last rx xanax March 3rd for #30 and ambien March 3rd for #30.  Pt takes xanax QD with ambien around 9am 10 pm when he's normally feeling "high stress" and it usually takes an hour for both to start. He has only taken xanax more than once when there were a lot of events occurring at work, so not taking daily.  Last night he had to wake up early so he didn't take [place] and he ended up getting 3 hours of sleep due to trouble sleeping. If he does wake up, it's to use the bathroom if he drank too much water before bed. He would grind his teeth at  night and reports xanax would help resolve this. Has an appt with  Dr. Buddy Duty tomorrow for his weight loss due to his inability to lose weight. Pt last took lexapro, celexa, and other unknown SSRIs 8 years ago with unknown tolerance. pt isn't sure what the side effect were with each med.   Migraine HAs: He was experiencing those 5-6x per month, but thought to be stress related. Treated with maxalt PRN. Discussed daily preventative medication with SSRI or possible evaluation with neuro. Referred to neurology for further evaluation.  Pt has had 4 migraines this past month and was treated with maxalt every time. Pt mentions his HA are triggered by loss of sleep and had a HA for 2 days after his sleep study. His HAs are relieved with treatment from the chiropractor. Pt has an appt with Dr. Domingo Cocking tomorrow.  GERD: He was on 2 seperate PPIs previously. Recommended GI evaluation due to continued persistent need for BID PPI. Did discuss QD dosing at last visit.  Pt's GERD regimen consist of nexium every morning and occassionally protonix at night if he needs it. Pt has an appt with Dr. [name] tomorrow.   Asthma: He was on advair, albuterol, atrovent, and singular but only using advair PRN, albuterol 1-2x every other week.  Uses advair QD, allegra, and flovent for "upper respiratory/sinus issues" during  basketball season. Pt has been using albuterol BID 2-3x this month as his rescue inhaler. Pt has not been taking singular.   IBS: He takes bentyl BID to TID for diarrhea with IBS. Had not seen GI in approx 4 years. Recommend GI follow-up.Pt takes bentyl BID. Has appt with GI as above.   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 Followed by Dr. Buddy Duty.   Lipoma: Reports 3 sore masses located on his right abdomen and was recommended surgery when he followed-up. Denies fever, nausea, and vomiting.  Patient Active Problem List   Diagnosis Date Noted  . Irritable  bowel syndrome 03/14/2016  . Asthma 11/19/2015  . 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 rotator cuff tear 10/17/2013  . Hypogonadism male 09/04/2013  . ED (erectile dysfunction) 07/30/2013  . Palpitations 02/21/2012  . BMI 33.0-33.9,adult 02/04/2012  . Allergic rhinitis 02/04/2012  . GERD (gastroesophageal reflux disease) 02/04/2012  . Migraine 02/04/2012  . GAD (generalized anxiety disorder) 02/04/2012  . Insomnia 02/04/2012  . HYPERTENSION, BENIGN 04/13/2009  . CHEST PAIN-UNSPECIFIED 04/13/2009   Past Medical History:  Diagnosis Date  . Anxiety   . Arthritis   . Asthma   . Depression   . GERD (gastroesophageal reflux disease)   . Seasonal allergic reaction    Past Surgical History:  Procedure Laterality Date  . none    . none     Allergies  Allergen Reactions  . Desloratadine Other (See Comments)    CLARINEX-"severe headache"  . Hydrocod Polst-Cpm Polst Er Itching  . Loratadine Other (See Comments)    "severe headache"  . Tussionex Pennkinetic Er [Hydrocod Polst-Cpm Polst Er] Itching  . Levbid [Hyoscyamine Sulfate] Rash  . Telithromycin Rash   Prior to Admission medications   Medication Sig Start Date End Date Taking? Authorizing Provider  acetic acid-hydrocortisone (VOSOL-HC) otic solution Place 3 drops into both ears 3 (three) times daily. Use as needed to prevent swimmer's ear 01/10/15  Yes Leandrew Koyanagi, MD  albuterol (PROVENTIL HFA;VENTOLIN HFA) 108 (90 Base) MCG/ACT inhaler Inhale 2 puffs into the lungs every 6 (six) hours as needed for wheezing or shortness of breath. 04/20/16  Yes Leandrew Koyanagi, MD  ALPRAZolam [name]) 1 MG tablet TAKE 1 TABLET BY MOUTH THREE TIMES DAILY AS NEEDED FOR ANXIETY 01/05/17  Yes [name], MD  amLODipine (NORVASC) 10 MG tablet Take 1 tablet (10 mg total) by mouth daily. 10/05/16  Yes Sherren Mocha, MD  aspirin EC 81 MG tablet Take 1 tablet (81 mg total) by mouth daily. 03/07/15  Yes Sherren Mocha, MD    cyclobenzaprine (FLEXERIL) 10 MG tablet Take 1 tablet (10 mg total) by mouth 3 (three) times daily as needed for muscle spasms. 04/20/16  Yes Leandrew Koyanagi, MD  diclofenac (VOLTAREN) 75 MG EC tablet Take 75 mg by mouth 2 (two) times daily as needed (pain).   Yes Historical Provider, MD  dicyclomine (BENTYL) 20 MG tablet Take 1 tablet (20 mg total) by mouth every 6 (six) hours. As needed for symptoms 11/29/16  Yes [name], MD  emtricitabine-tenofovir (TRUVADA) 200-300 MG tablet Take 1 tablet by mouth daily. 04/20/16  Yes Leandrew Koyanagi, MD  esomeprazole (NEXIUM) 40 MG capsule Take 1 capsule (40 mg total) by mouth daily before breakfast. 04/25/15  Yes Leandrew Koyanagi, MD  Fexofenadine HCl (ALLEGRA PO) Take 1 tablet by mouth daily.    Yes Historical Provider, MD  fluticasone (FLONASE) 50  MCG/ACT nasal spray INSTILL 2 SPRAYS IN EACH NOSTRIL DAILY 04/20/16  Yes Leandrew Koyanagi, MD  Fluticasone-Salmeterol (ADVAIR) 250-50 MCG/DOSE AEPB Inhale 1 puff into the lungs 2 (two) times daily as needed (allergies/wheezing/sob).   Yes Historical Provider, MD  hydrocortisone (ANUSOL-HC) 2.5 % rectal cream Place 1 application rectally 2 (two) times daily. 11/29/16  Yes [name], MD  ipratropium (ATROVENT HFA) 17 MCG/ACT inhaler INHALE 2 PUFFS INTO THE LUNGS TWICE DAILY OR AS DIRECTED 04/20/16  Yes Leandrew Koyanagi, MD  ipratropium (ATROVENT) 0.06 % nasal spray Place 2 sprays into the nose 2 (two) times daily. 11/29/16  Yes [name], MD  ketoprofen (ORUDIS) 75 MG capsule Take 1 capsule (75 mg total) by mouth 4 (four) times daily as needed for mild pain. 04/20/16  Yes Leandrew Koyanagi, MD  montelukast (SINGULAIR) 10 MG tablet Take 1 tablet (10 mg total) by mouth at bedtime. 04/20/16  Yes Leandrew Koyanagi, MD  pantoprazole (PROTONIX) 40 MG tablet Take 1 tablet (40 mg total) by mouth daily. 08/30/16  Yes Wardell Honour, MD  rizatriptan (MAXALT-MLT) 10 MG disintegrating tablet TAKE AS  DIRECTED 04/20/16  Yes Leandrew Koyanagi, MD  tadalafil (CIALIS) 20 MG tablet Take 1 tablet (20 mg total) by mouth daily as needed for erectile dysfunction. 10/05/16  Yes Sherren Mocha, MD  valsartan (DIOVAN) 80 MG tablet TAKE 1 TABLET(80 MG) BY MOUTH DAILY 10/05/16  Yes Sherren Mocha, MD  zolpidem (AMBIEN) 10 MG tablet TAKE 1 TABLET BY MOUTH AT BEDTIME 01/05/17  Yes [name], MD   Social History   Social History  . Marital status: Single    Spouse name: N/A  . Number of children: 0  . Years of education: N/A   Occupational History  . [employer])    Social History Main Topics  . Smoking status: Never Smoker  . Smokeless tobacco: Never Used  . Alcohol use 1.8 oz/week    3 Standard drinks or equivalent per week  . Drug use: No  . Sexual activity: Yes   Other Topics Concern  . Not on file   Social History Narrative  . No narrative on file   Review of Systems  Constitutional: Negative for fever.  Respiratory: Positive for shortness of breath.   Gastrointestinal: Positive for abdominal pain (from masses). Negative for nausea and vomiting.  Psychiatric/Behavioral: Positive for sleep disturbance. The patient is nervous/anxious.     Objective:  Physical Exam  Constitutional: He appears well-developed and well-nourished. No distress.  HENT:  Head: Normocephalic and atraumatic.  Nose: Mucosal edema present.  Crusted blood on the L>R septum With some edema of the turbinates bilaterally  Eyes: Conjunctivae are normal.  Neck: Neck supple.  Cardiovascular: Normal rate, regular rhythm and normal heart sounds.  Exam reveals no gallop and no friction rub.   No murmur heard. Pulmonary/Chest: Effort normal and breath sounds normal. No respiratory distress. He has no wheezes. He has no rales.  Abdominal: He exhibits mass. There is tenderness.  Soft tissue mobile prominence right upper flank Other smaller soft tissue slightly tender rounded areas  upper abdomen  Neurological: He is alert.  Skin: Skin is warm and dry.  Psychiatric: He has a normal mood and affect. His behavior is normal.  Nursing note and vitals reviewed.   Vitals:   01/17/17 1537  BP: 126/87  Pulse: 95  Resp: 18  Temp: 98.9 F (37.2 C)  TempSrc: Oral  SpO2: 94%  Weight: 251 lb (113.9 kg)  Height: 5\' 11"  (1.803 m)  Body mass index is 35.01 kg/m. Assessment & Plan:  35 mins of face to face care provided, greater than 50% counseling.  Makyle Eslick. is a 46 y.o. male Other migraine without status migrainosus, not intractable  - No change in meds at this time, has neurology follow-up plan to repeat regimen. Discussed potential benefit of SSRI with migraine headaches.  Gastroesophageal reflux disease, esophagitis presence not specified, Irritable bowel syndrome with diarrhea  -Unsure of reason for 2 separate PPIs. Gastro neurology follow-up as above, and recommended discussing regimen with GI, including regimen for his IBS.   Uncomplicated asthma, unspecified asthma severity, unspecified whether persistent  - Stable currently, and singular if needed. Discussed potential change from Advair to solitary inhaled corticosteroid, but can be dependent on frequency of use and can review that next few months.  Insomnia, unspecified type  -As discussed last visit, some concern with both benzodiazepine and Ambien at bedtime. Previous history of obstructive sleep apnea, but was at a much heavier weight at that time. Suspect anxiety as a primary contributor.   -Agreed to try Zoloft, with recommendation of decreasing Xanax to one half pill at bedtime and ultimate goal of transitioning to just Ambien or Xanax  - Recheck 6 weeks, and is still having difficulty with sleep, consider sleep study or meeting with sleep specialist to determine if study indicated. RTC sooner if difficulty with SSRI. Initial side effects were discussed.  Areas of concern abdominal wall appear to  be lipomas/painful lipomas, but advised if any fever, nausea, vomiting, or worsening pain or skin changes, should be seen immediately. Otherwise can follow-up at next visit.  No orders of the defined types were placed in this encounter.  Patient Instructions   Start Zoloft. Try to decrease xanax to 1/2 pill at bedtime with goal of transitioning to just Ambien or just Xanax. Depending on symptoms at 6 weeks, we can discuss sleep apnea testing if needed.    Zoloft may also help headaches, but follow up with headache specialist as planned tomorrow.  Keep follow up with Dr. [name] to discuss IBS and use of proton pump inhibitors for GERD. (likely will transition to single medicine instead of 2 different proton pump inhibitors).  Keep follow up with Dr. Buddy Duty as planned for testosterone injection.   Areas on abdominal wall appear to be lipomas, but if more discomfort prior to next visit, can be seen by a provider locally in [place] if needed.  Follow up with me in 6 weeks.   Return to the clinic or go to the nearest emergency room if any of your symptoms worsen or new symptoms occur.   IF you received an x-ray today, you will receive an invoice from [facility]. Please contact [facility] at [phone] with questions or concerns regarding your invoice.   IF you received labwork today, you will receive an invoice from [place]. Please contact [facility] at [phone] with questions or concerns regarding your invoice.   Our billing staff will not be able to assist you with questions regarding bills from these companies.  You will be contacted with the lab results as soon as they are available. The fastest way to get your results is to activate your My Chart account. Instructions are located on the last page of this paperwork. If you have not heard from [place] regarding the results in 2 weeks, please contact this office.  I personally performed the services described in  this documentation, which was scribed in my presence. The recorded information has been reviewed and considered for accuracy and completeness, addended by me as needed, and agree with information above.  Signed,   [name], MD [facility] [place].  01/20/17 9:53 PM

## 2017-01-17 NOTE — Patient Instructions (Addendum)
Start Zoloft. Try to decrease xanax to 1/2 pill at bedtime with goal of transitioning to just Ambien or just Xanax. Depending on symptoms at 6 weeks, we can discuss sleep apnea testing if needed.    Zoloft may also help headaches, but follow up with headache specialist as planned tomorrow.  Keep follow up with Dr. [name] to discuss IBS and use of proton pump inhibitors for GERD. (likely will transition to single medicine instead of 2 different proton pump inhibitors).  Keep follow up with Dr. Buddy Duty as planned for testosterone injection.   Areas on abdominal wall appear to be lipomas, but if more discomfort prior to next visit, can be seen by a provider locally in [place] if needed.  Follow up with me in 6 weeks.   Return to the clinic or go to the nearest emergency room if any of your symptoms worsen or new symptoms occur.   IF you received an x-ray today, you will receive an invoice from [facility]. Please contact [facility] at [phone] with questions or concerns regarding your invoice.   IF you received labwork today, you will receive an invoice from [place]. Please contact [facility] at [phone] with questions or concerns regarding your invoice.   Our billing staff will not be able to assist you with questions regarding bills from these companies.  You will be contacted with the lab results as soon as they are available. The fastest way to get your results is to activate your My Chart account. Instructions are located on the last page of this paperwork. If you have not heard from [place] regarding the results in 2 weeks, please contact this office.

## 2017-01-18 DIAGNOSIS — K219 Gastro-esophageal reflux disease without esophagitis: Secondary | ICD-10-CM | POA: Diagnosis not present

## 2017-01-18 DIAGNOSIS — K580 Irritable bowel syndrome with diarrhea: Secondary | ICD-10-CM | POA: Diagnosis not present

## 2017-01-18 DIAGNOSIS — G43719 Chronic migraine without aura, intractable, without status migrainosus: Secondary | ICD-10-CM | POA: Diagnosis not present

## 2017-01-18 DIAGNOSIS — Z125 Encounter for screening for malignant neoplasm of prostate: Secondary | ICD-10-CM | POA: Diagnosis not present

## 2017-01-18 DIAGNOSIS — Z049 Encounter for examination and observation for unspecified reason: Secondary | ICD-10-CM | POA: Diagnosis not present

## 2017-01-18 DIAGNOSIS — Z5181 Encounter for therapeutic drug level monitoring: Secondary | ICD-10-CM | POA: Diagnosis not present

## 2017-01-18 DIAGNOSIS — E230 Hypopituitarism: Secondary | ICD-10-CM | POA: Diagnosis not present

## 2017-01-21 ENCOUNTER — Ambulatory Visit (INDEPENDENT_AMBULATORY_CARE_PROVIDER_SITE_OTHER): Payer: BLUE CROSS/BLUE SHIELD | Admitting: Family Medicine

## 2017-01-21 VITALS — BP 128/76 | HR 91 | Temp 97.7°F | Resp 18 | Ht 71.0 in | Wt 254.2 lb

## 2017-01-21 DIAGNOSIS — J069 Acute upper respiratory infection, unspecified: Secondary | ICD-10-CM

## 2017-01-21 DIAGNOSIS — F411 Generalized anxiety disorder: Secondary | ICD-10-CM | POA: Diagnosis not present

## 2017-01-21 DIAGNOSIS — R05 Cough: Secondary | ICD-10-CM

## 2017-01-21 DIAGNOSIS — E230 Hypopituitarism: Secondary | ICD-10-CM | POA: Diagnosis not present

## 2017-01-21 DIAGNOSIS — R059 Cough, unspecified: Secondary | ICD-10-CM

## 2017-01-21 DIAGNOSIS — Z5181 Encounter for therapeutic drug level monitoring: Secondary | ICD-10-CM | POA: Diagnosis not present

## 2017-01-21 DIAGNOSIS — E669 Obesity, unspecified: Secondary | ICD-10-CM | POA: Diagnosis not present

## 2017-01-21 DIAGNOSIS — Z125 Encounter for screening for malignant neoplasm of prostate: Secondary | ICD-10-CM | POA: Diagnosis not present

## 2017-01-21 MED ORDER — BENZONATATE 100 MG PO CAPS: 100.0000 mg | ORAL_CAPSULE | Freq: Three times a day (TID) | ORAL | 0 refills | Status: DC | PRN

## 2017-01-21 MED ORDER — SERTRALINE HCL 50 MG PO TABS: 50.0000 mg | ORAL_TABLET | Freq: Every day | ORAL | 2 refills | Status: DC

## 2017-01-21 NOTE — Progress Notes (Signed)
By signing my name below, I, Mesha Guinyard, attest that this documentation has been prepared under the direction and in the presence of [name], MD.  Electronically Signed: Verlee Monte, Medical Scribe. 01/21/17. 2:53 PM.  Subjective:    Patient ID: Randall Apple., male    DOB: 1971/05/21, 45 y.o.   MRN: [id number]  HPI Chief Complaint  Patient presents with  . Cough    X 3 days  . Sore Throat    X 1 day  . Nasal Congestion    X 2 -3 days    HPI Comments: Randall Arriaga. is a 46 y.o. male who presents to the Primary Care at [facility] complaining of painful cough onset 3 days ago. Reports associated sxs of nasal congestion onset 2 days ago, sore throat onset today that caused pain with swallowing, difficulty swallowing that felt similar to throat edema/closing and hoarse voice onset this morning. Pt took mucinex-D without relief of his sxs. Denies fever, and palpitations.  Pt used tessalon pearls in the past and has found relief of his sxs.  GERD: Pt went to a specialist and now takes nexium BID, and left on bentyl. He was also told to get his colonoscopy as well as endoscopy at 46 y/o.  IBS: Pt was recommended bentyl injections every 2 weeks, but deferred due to his work schedule. He was recommended a diet change outlined as nothing but water, no process food, no dairy, and no red meat. Pt is willing to change his diet.  HA: Pt is on zonisamide daily  Patient Active Problem List   Diagnosis Date Noted  . Irritable bowel syndrome 03/14/2016  . Asthma 11/19/2015  . 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 rotator cuff tear 10/17/2013  . Hypogonadism male 09/04/2013  . ED (erectile dysfunction) 07/30/2013  . Palpitations 02/21/2012  . BMI 33.0-33.9,adult 02/04/2012  . Allergic rhinitis 02/04/2012  . GERD (gastroesophageal reflux disease) 02/04/2012  . Migraine 02/04/2012  . GAD (generalized anxiety disorder) 02/04/2012  .  Insomnia 02/04/2012  . HYPERTENSION, BENIGN 04/13/2009  . CHEST PAIN-UNSPECIFIED 04/13/2009   Past Medical History:  Diagnosis Date  . Anxiety   . Arthritis   . Asthma   . Depression   . GERD (gastroesophageal reflux disease)   . Seasonal allergic reaction    Past Surgical History:  Procedure Laterality Date  . none    . none     Allergies  Allergen Reactions  . Desloratadine Other (See Comments)    CLARINEX-"severe headache"  . Hydrocod Polst-Cpm Polst Er Itching  . Loratadine Other (See Comments)    "severe headache"  . Tussionex Pennkinetic Er [Hydrocod Polst-Cpm Polst Er] Itching  . Levbid [Hyoscyamine Sulfate] Rash  . Telithromycin Rash   Prior to Admission medications   Medication Sig Start Date End Date Taking? Authorizing Provider  acetic acid-hydrocortisone (VOSOL-HC) otic solution Place 3 drops into both ears 3 (three) times daily. Use as needed to prevent swimmer's ear 01/10/15  Yes Leandrew Koyanagi, MD  albuterol (PROVENTIL HFA;VENTOLIN HFA) 108 (90 Base) MCG/ACT inhaler Inhale 2 puffs into the lungs every 6 (six) hours as needed for wheezing or shortness of breath. 04/20/16  Yes Leandrew Koyanagi, MD  ALPRAZolam [name]) 1 MG tablet TAKE 1 TABLET BY MOUTH THREE TIMES DAILY AS NEEDED FOR ANXIETY 01/05/17  Yes [name], MD  amLODipine (NORVASC) 10 MG tablet Take 1 tablet (10 mg total) by mouth daily. 10/05/16  Yes Sherren Mocha, MD  aspirin EC 81 MG tablet Take 1 tablet (81 mg total) by mouth daily. 03/07/15  Yes Sherren Mocha, MD  cyclobenzaprine (FLEXERIL) 10 MG tablet Take 1 tablet (10 mg total) by mouth 3 (three) times daily as needed for muscle spasms. 04/20/16  Yes Leandrew Koyanagi, MD  diclofenac (VOLTAREN) 75 MG EC tablet Take 75 mg by mouth 2 (two) times daily as needed (pain).   Yes Historical Provider, MD  dicyclomine (BENTYL) 20 MG tablet Take 1 tablet (20 mg total) by mouth every 6 (six) hours. As needed for symptoms 11/29/16  Yes [name], MD   emtricitabine-tenofovir (TRUVADA) 200-300 MG tablet Take 1 tablet by mouth daily. 04/20/16  Yes Leandrew Koyanagi, MD  esomeprazole (NEXIUM) 40 MG capsule Take 1 capsule (40 mg total) by mouth daily before breakfast. 04/25/15  Yes Leandrew Koyanagi, MD  Fexofenadine HCl (ALLEGRA PO) Take 1 tablet by mouth daily.    Yes Historical Provider, MD  fluticasone (FLONASE) 50 MCG/ACT nasal spray INSTILL 2 SPRAYS IN EACH NOSTRIL DAILY 04/20/16  Yes Leandrew Koyanagi, MD  Fluticasone-Salmeterol (ADVAIR) 250-50 MCG/DOSE AEPB Inhale 1 puff into the lungs 2 (two) times daily as needed (allergies/wheezing/sob).   Yes Historical Provider, MD  hydrocortisone (ANUSOL-HC) 2.5 % rectal cream Place 1 application rectally 2 (two) times daily. 11/29/16  Yes [name], MD  ipratropium (ATROVENT HFA) 17 MCG/ACT inhaler INHALE 2 PUFFS INTO THE LUNGS TWICE DAILY OR AS DIRECTED 04/20/16  Yes Leandrew Koyanagi, MD  ipratropium (ATROVENT) 0.06 % nasal spray Place 2 sprays into the nose 2 (two) times daily. 11/29/16  Yes [name], MD  ketoprofen (ORUDIS) 75 MG capsule Take 1 capsule (75 mg total) by mouth 4 (four) times daily as needed for mild pain. 04/20/16  Yes Leandrew Koyanagi, MD  montelukast (SINGULAIR) 10 MG tablet Take 1 tablet (10 mg total) by mouth at bedtime. 04/20/16  Yes Leandrew Koyanagi, MD  rizatriptan (MAXALT-MLT) 10 MG disintegrating tablet TAKE AS DIRECTED 04/20/16  Yes Leandrew Koyanagi, MD  tadalafil (CIALIS) 20 MG tablet Take 1 tablet (20 mg total) by mouth daily as needed for erectile dysfunction. 10/05/16  Yes Sherren Mocha, MD  valsartan (DIOVAN) 80 MG tablet TAKE 1 TABLET(80 MG) BY MOUTH DAILY 10/05/16  Yes Sherren Mocha, MD  zolpidem (AMBIEN) 10 MG tablet TAKE 1 TABLET BY MOUTH AT BEDTIME 01/05/17  Yes [name], MD  pantoprazole (PROTONIX) 40 MG tablet Take 1 tablet (40 mg total) by mouth daily. Patient not taking: Reported on 01/21/2017 08/30/16   Wardell Honour, MD   Social  History   Social History  . Marital status: Single    Spouse name: N/A  . Number of children: 0  . Years of education: N/A   Occupational History  . [employer])    Social History Main Topics  . Smoking status: Never Smoker  . Smokeless tobacco: Never Used  . Alcohol use 1.8 oz/week    3 Standard drinks or equivalent per week  . Drug use: No  . Sexual activity: Yes   Other Topics Concern  . Not on file   Social History Narrative  . No narrative on file   Review of Systems  Constitutional: Negative for fever.  HENT: Positive for congestion, sore throat, trouble swallowing and voice change.   Respiratory: Positive for cough.   Cardiovascular: Negative for palpitations.   Objective:  Physical Exam  Constitutional: He appears well-developed and well-nourished. No distress.  HENT:  Head: Normocephalic and atraumatic.  Mouth/Throat: Posterior oropharyngeal erythema (minimal) present. No oropharyngeal exudate.  Clear nasal drainage bilaterally  Eyes: Conjunctivae are normal.  Neck: Neck supple.  Cardiovascular: Normal rate, regular rhythm and normal heart sounds.  Exam reveals no gallop and no friction rub.   No murmur heard. Pulmonary/Chest: Effort normal and breath sounds normal. No respiratory distress. He has no wheezes. He has no rales.  Neurological: He is alert.  Skin: Skin is warm and dry.  Psychiatric: He has a normal mood and affect. His behavior is normal.  Nursing note and vitals reviewed.   Vitals:   01/21/17 1410  BP: 128/76  Pulse: 91  Resp: 18  Temp: 97.7 F (36.5 C)  TempSrc: Oral  SpO2: 97%  Weight: 254 lb 3.2 oz (115.3 kg)  Height: 5\' 11"  (1.803 m)  Body mass index is 35.45 kg/m. Assessment & Plan:    Randall Casasola. is a 46 y.o. male Acute upper respiratory infection Cough - Plan: benzonatate (TESSALON) 100 MG capsule  - Viral infection likely. Mucinex or Mucinex DM for cough, saline nasal spray,  Tessalon Perles prescribed if needed for cough. Symptomatic care and RTC precautions were discussed  Anxiety state - Plan: sertraline (ZOLOFT) 50 MG tablet  - See last office visit, had not received Zoloft prescription. Start Zoloft 50 mg daily as last discussed.  Meds ordered this encounter  Medications  . sertraline (ZOLOFT) 50 MG tablet    Sig: Take 1 tablet (50 mg total) by mouth daily.    Dispense:  30 tablet    Refill:  2  . benzonatate (TESSALON) 100 MG capsule    Sig: Take 1 capsule (100 mg total) by mouth 3 (three) times daily as needed for cough.    Dispense:  20 capsule    Refill:  0   Patient Instructions    Unfortunately your current symptoms appear to be due to a virus. Okay to use Mucinex or Mucinex DM for cough, or the [employer] that were prescribed. If you do use Mucinex D, make sure you're using saline nasal spray to lessen congestion. Typically these viruses can last 7-10 days, so you should be improving during that time.  Make sure you get sufficient rest, fluids.  Return to the clinic or go to the nearest emergency room if any of your symptoms worsen or new symptoms occur.  Zoloft was sent in. Let me know if you have another name for headache specialist in [place] and I'll be happy to refer you.  Upper Respiratory Infection, Adult Most upper respiratory infections (URIs) are a viral infection of the air passages leading to the lungs. A URI affects the nose, throat, and upper air passages. The most common type of URI is nasopharyngitis and is typically referred to as "the common cold." URIs run their course and usually go away on their own. Most of the time, a URI does not require medical attention, but sometimes a bacterial infection in the upper airways can follow a viral infection. This is called a secondary infection. Sinus and middle ear infections are common types of secondary upper respiratory infections. Bacterial pneumonia can also complicate a URI. A  URI can worsen asthma and chronic obstructive pulmonary disease (COPD). Sometimes, these complications can require emergency medical care and may be life threatening. What are the causes? Almost all URIs are caused by viruses. A virus is a type of germ  and can spread from one person to another. What increases the risk? You may be at risk for a URI if:  You smoke.  You have chronic heart or lung disease.  You have a weakened defense (immune) system.  You are very young or very old.  You have nasal allergies or asthma.  You work in crowded or poorly ventilated areas.  You work in health care facilities or schools. What are the signs or symptoms? Symptoms typically develop 2-3 days after you come in contact with a cold virus. Most viral URIs last 7-10 days. However, viral URIs from the influenza virus (flu virus) can last 14-18 days and are typically more severe. Symptoms may include:  Runny or stuffy (congested) nose.  Sneezing.  Cough.  Sore throat.  Headache.  Fatigue.  Fever.  Loss of appetite.  Pain in your forehead, behind your eyes, and over your cheekbones (sinus pain).  Muscle aches. How is this diagnosed? Your health care provider may diagnose a URI by:  Physical exam.  Tests to check that your symptoms are not due to another condition such as:  Strep throat.  Sinusitis.  Pneumonia.  Asthma. How is this treated? A URI goes away on its own with time. It cannot be cured with medicines, but medicines may be prescribed or recommended to relieve symptoms. Medicines may help:  Reduce your fever.  Reduce your cough.  Relieve nasal congestion. Follow these instructions at home:  Take medicines only as directed by your health care provider.  Gargle warm saltwater or take cough drops to comfort your throat as directed by your health care provider.  Use a warm mist humidifier or inhale steam from a shower to increase air moisture. This may make it  easier to breathe.  Drink enough fluid to keep your urine clear or pale yellow.  Eat soups and other clear broths and maintain good nutrition.  Rest as needed.  Return to work when your temperature has returned to normal or as your health care provider advises. You may need to stay home longer to avoid infecting others. You can also use a face mask and careful hand washing to prevent spread of the virus.  Increase the usage of your inhaler if you have asthma.  Do not use any tobacco products, including cigarettes, chewing tobacco, or electronic cigarettes. If you need help quitting, ask your health care provider. How is this prevented? The best way to protect yourself from getting a cold is to practice good hygiene.  Avoid oral or hand contact with people with cold symptoms.  Wash your hands often if contact occurs. There is no clear evidence that vitamin C, vitamin E, echinacea, or exercise reduces the chance of developing a cold. However, it is always recommended to get plenty of rest, exercise, and practice good nutrition. Contact a health care provider if:  You are getting worse rather than better.  Your symptoms are not controlled by medicine.  You have chills.  You have worsening shortness of breath.  You have brown or red mucus.  You have yellow or brown nasal discharge.  You have pain in your face, especially when you bend forward.  You have a fever.  You have swollen neck glands.  You have pain while swallowing.  You have white areas in the back of your throat. Get help right away if:  You have severe or persistent:  Headache.  Ear pain.  Sinus pain.  Chest pain.  You have chronic lung disease  and any of the following:  Wheezing.  Prolonged cough.  Coughing up blood.  A change in your usual mucus.  You have a stiff neck.  You have changes in your:  Vision.  Hearing.  Thinking.  Mood. This information is not intended to replace  advice given to you by your health care provider. Make sure you discuss any questions you have with your health care provider. Document Released: 04/17/2001 Document Revised: 06/24/2016 Document Reviewed: 01/27/2014 Elsevier Interactive Patient Education  2017 [employer].   IF you received an x-ray today, you will receive an invoice from [facility]. Please contact [facility] at [phone] with questions or concerns regarding your invoice.   IF you received labwork today, you will receive an invoice from [place]. Please contact [facility] at [phone] with questions or concerns regarding your invoice.   Our billing staff will not be able to assist you with questions regarding bills from these companies.  You will be contacted with the lab results as soon as they are available. The fastest way to get your results is to activate your My Chart account. Instructions are located on the last page of this paperwork. If you have not heard from [place] regarding the results in 2 weeks, please contact this office.       I personally performed the services described in this documentation, which was scribed in my presence. The recorded information has been reviewed and considered for accuracy and completeness, addended by me as needed, and agree with information above.  Signed,   [name], MD [facility] [place].  01/21/17 3:12 PM

## 2017-01-21 NOTE — Patient Instructions (Addendum)
Unfortunately your current symptoms appear to be due to a virus. Okay to use Mucinex or Mucinex DM for cough, or the [employer] that were prescribed. If you do use Mucinex D, make sure you're using saline nasal spray to lessen congestion. Typically these viruses can last 7-10 days, so you should be improving during that time.  Make sure you get sufficient rest, fluids.  Return to the clinic or go to the nearest emergency room if any of your symptoms worsen or new symptoms occur.  Zoloft was sent in. Let me know if you have another name for headache specialist in [place] and I'll be happy to refer you.  Upper Respiratory Infection, Adult Most upper respiratory infections (URIs) are a viral infection of the air passages leading to the lungs. A URI affects the nose, throat, and upper air passages. The most common type of URI is nasopharyngitis and is typically referred to as "the common cold." URIs run their course and usually go away on their own. Most of the time, a URI does not require medical attention, but sometimes a bacterial infection in the upper airways can follow a viral infection. This is called a secondary infection. Sinus and middle ear infections are common types of secondary upper respiratory infections. Bacterial pneumonia can also complicate a URI. A URI can worsen asthma and chronic obstructive pulmonary disease (COPD). Sometimes, these complications can require emergency medical care and may be life threatening. What are the causes? Almost all URIs are caused by viruses. A virus is a type of germ and can spread from one person to another. What increases the risk? You may be at risk for a URI if:  You smoke.  You have chronic heart or lung disease.  You have a weakened defense (immune) system.  You are very young or very old.  You have nasal allergies or asthma.  You work in crowded or poorly ventilated areas.  You work in health care facilities or schools. What are  the signs or symptoms? Symptoms typically develop 2-3 days after you come in contact with a cold virus. Most viral URIs last 7-10 days. However, viral URIs from the influenza virus (flu virus) can last 14-18 days and are typically more severe. Symptoms may include:  Runny or stuffy (congested) nose.  Sneezing.  Cough.  Sore throat.  Headache.  Fatigue.  Fever.  Loss of appetite.  Pain in your forehead, behind your eyes, and over your cheekbones (sinus pain).  Muscle aches. How is this diagnosed? Your health care provider may diagnose a URI by:  Physical exam.  Tests to check that your symptoms are not due to another condition such as:  Strep throat.  Sinusitis.  Pneumonia.  Asthma. How is this treated? A URI goes away on its own with time. It cannot be cured with medicines, but medicines may be prescribed or recommended to relieve symptoms. Medicines may help:  Reduce your fever.  Reduce your cough.  Relieve nasal congestion. Follow these instructions at home:  Take medicines only as directed by your health care provider.  Gargle warm saltwater or take cough drops to comfort your throat as directed by your health care provider.  Use a warm mist humidifier or inhale steam from a shower to increase air moisture. This may make it easier to breathe.  Drink enough fluid to keep your urine clear or pale yellow.  Eat soups and other clear broths and maintain good nutrition.  Rest as needed.  Return to work  when your temperature has returned to normal or as your health care provider advises. You may need to stay home longer to avoid infecting others. You can also use a face mask and careful hand washing to prevent spread of the virus.  Increase the usage of your inhaler if you have asthma.  Do not use any tobacco products, including cigarettes, chewing tobacco, or electronic cigarettes. If you need help quitting, ask your health care provider. How is this  prevented? The best way to protect yourself from getting a cold is to practice good hygiene.  Avoid oral or hand contact with people with cold symptoms.  Wash your hands often if contact occurs. There is no clear evidence that vitamin C, vitamin E, echinacea, or exercise reduces the chance of developing a cold. However, it is always recommended to get plenty of rest, exercise, and practice good nutrition. Contact a health care provider if:  You are getting worse rather than better.  Your symptoms are not controlled by medicine.  You have chills.  You have worsening shortness of breath.  You have brown or red mucus.  You have yellow or brown nasal discharge.  You have pain in your face, especially when you bend forward.  You have a fever.  You have swollen neck glands.  You have pain while swallowing.  You have white areas in the back of your throat. Get help right away if:  You have severe or persistent:  Headache.  Ear pain.  Sinus pain.  Chest pain.  You have chronic lung disease and any of the following:  Wheezing.  Prolonged cough.  Coughing up blood.  A change in your usual mucus.  You have a stiff neck.  You have changes in your:  Vision.  Hearing.  Thinking.  Mood. This information is not intended to replace advice given to you by your health care provider. Make sure you discuss any questions you have with your health care provider. Document Released: 04/17/2001 Document Revised: 06/24/2016 Document Reviewed: 01/27/2014 Elsevier Interactive Patient Education  2017 [employer].   IF you received an x-ray today, you will receive an invoice from [facility]. Please contact [facility] Radiology at [phone] with questions or concerns regarding your invoice.   IF you received labwork today, you will receive an invoice from [place]. Please contact [facility] at [phone] with questions or concerns regarding your invoice.   Our  billing staff will not be able to assist you with questions regarding bills from these companies.  You will be contacted with the lab results as soon as they are available. The fastest way to get your results is to activate your My Chart account. Instructions are located on the last page of this paperwork. If you have not heard from [place] regarding the results in 2 weeks, please contact this office.

## 2017-01-25 ENCOUNTER — Encounter: Payer: Self-pay | Admitting: Family Medicine

## 2017-01-30 DIAGNOSIS — M9903 Segmental and somatic dysfunction of lumbar region: Secondary | ICD-10-CM | POA: Diagnosis not present

## 2017-01-30 DIAGNOSIS — M9905 Segmental and somatic dysfunction of pelvic region: Secondary | ICD-10-CM | POA: Diagnosis not present

## 2017-01-30 DIAGNOSIS — M532X7 Spinal instabilities, lumbosacral region: Secondary | ICD-10-CM | POA: Diagnosis not present

## 2017-01-30 DIAGNOSIS — M9904 Segmental and somatic dysfunction of sacral region: Secondary | ICD-10-CM | POA: Diagnosis not present

## 2017-02-05 ENCOUNTER — Other Ambulatory Visit: Payer: Self-pay | Admitting: Family Medicine

## 2017-02-06 ENCOUNTER — Other Ambulatory Visit: Payer: Self-pay | Admitting: Family Medicine

## 2017-02-06 DIAGNOSIS — K644 Residual hemorrhoidal skin tags: Secondary | ICD-10-CM

## 2017-02-06 NOTE — Telephone Encounter (Signed)
Refilled. See plan last OV 01/17/17

## 2017-02-06 NOTE — Telephone Encounter (Signed)
Please review

## 2017-02-06 NOTE — Telephone Encounter (Signed)
Refilled cream.  ambien already refilled on other request.

## 2017-02-07 NOTE — Telephone Encounter (Signed)
called

## 2017-02-13 DIAGNOSIS — J0100 Acute maxillary sinusitis, unspecified: Secondary | ICD-10-CM | POA: Diagnosis not present

## 2017-02-20 DIAGNOSIS — M9904 Segmental and somatic dysfunction of sacral region: Secondary | ICD-10-CM | POA: Diagnosis not present

## 2017-02-20 DIAGNOSIS — M9905 Segmental and somatic dysfunction of pelvic region: Secondary | ICD-10-CM | POA: Diagnosis not present

## 2017-02-20 DIAGNOSIS — M9903 Segmental and somatic dysfunction of lumbar region: Secondary | ICD-10-CM | POA: Diagnosis not present

## 2017-02-20 DIAGNOSIS — M532X7 Spinal instabilities, lumbosacral region: Secondary | ICD-10-CM | POA: Diagnosis not present

## 2017-02-25 ENCOUNTER — Other Ambulatory Visit: Payer: Self-pay | Admitting: Family Medicine

## 2017-03-03 ENCOUNTER — Other Ambulatory Visit: Payer: Self-pay | Admitting: Family Medicine

## 2017-03-03 DIAGNOSIS — F411 Generalized anxiety disorder: Secondary | ICD-10-CM

## 2017-03-06 NOTE — Telephone Encounter (Signed)
Scripts faxed to [employer]

## 2017-03-07 ENCOUNTER — Encounter: Payer: Self-pay | Admitting: Family Medicine

## 2017-03-07 ENCOUNTER — Ambulatory Visit (INDEPENDENT_AMBULATORY_CARE_PROVIDER_SITE_OTHER): Payer: BLUE CROSS/BLUE SHIELD | Admitting: Family Medicine

## 2017-03-07 VITALS — BP 135/82 | HR 116 | Temp 98.5°F | Resp 18 | Ht 70.67 in | Wt 255.8 lb

## 2017-03-07 DIAGNOSIS — F411 Generalized anxiety disorder: Secondary | ICD-10-CM | POA: Diagnosis not present

## 2017-03-07 DIAGNOSIS — N50811 Right testicular pain: Secondary | ICD-10-CM

## 2017-03-07 DIAGNOSIS — J309 Allergic rhinitis, unspecified: Secondary | ICD-10-CM | POA: Diagnosis not present

## 2017-03-07 DIAGNOSIS — R222 Localized swelling, mass and lump, trunk: Secondary | ICD-10-CM

## 2017-03-07 DIAGNOSIS — G4700 Insomnia, unspecified: Secondary | ICD-10-CM

## 2017-03-07 DIAGNOSIS — Z7251 High risk heterosexual behavior: Secondary | ICD-10-CM

## 2017-03-07 DIAGNOSIS — J4520 Mild intermittent asthma, uncomplicated: Secondary | ICD-10-CM

## 2017-03-07 DIAGNOSIS — G43809 Other migraine, not intractable, without status migrainosus: Secondary | ICD-10-CM | POA: Diagnosis not present

## 2017-03-07 DIAGNOSIS — N451 Epididymitis: Secondary | ICD-10-CM

## 2017-03-07 LAB — POCT URINALYSIS DIP (MANUAL ENTRY)
Bilirubin, UA: NEGATIVE
Glucose, UA: NEGATIVE mg/dL
Ketones, POC UA: NEGATIVE mg/dL
LEUKOCYTES UA: NEGATIVE
NITRITE UA: NEGATIVE
Protein Ur, POC: NEGATIVE mg/dL
Spec Grav, UA: 1.015 (ref 1.010–1.025)
UROBILINOGEN UA: 0.2 U/dL
pH, UA: 6 (ref 5.0–8.0)

## 2017-03-07 MED ORDER — CIPROFLOXACIN HCL 500 MG PO TABS: 500.0000 mg | ORAL_TABLET | Freq: Two times a day (BID) | ORAL | 0 refills | Status: DC

## 2017-03-07 NOTE — Patient Instructions (Addendum)
For allergies: Increase Flonase to 2 sprays per nostril each day. Atrovent nasal spray up to 2 times per day. Continue allegra once per day. If still having trouble with allergies, can refer you to allergist to look into other options if needed. Would avoid prednisone for now.   I do recommend follow up with headache specialist - please let me know who you would like me to refer you to in [place]. Continue Maxalt for migraine if needed for now.   OK to continue xanax with goal of weaning down to lowest effective dose.I would still caution on combining the xanax with Ambien at night. If still requiring this combo, or persistent daily use in next few months, would recommend different SSRI, Wellbutrin, or meeting with psychiatrist.   Call [facility] to follow up on the abdominal wall mass/abnormality. [phone].  You may not need a referral as they have seen you previously. Let me know if they do need a referral and I can send one in.   If asthma well controlled on once daily Advair, we have option of decreasing to inhaled steroid only.   Return to the clinic or go to the nearest emergency room if any of your symptoms worsen or new symptoms occur.   Epididymitis Epididymitis is swelling (inflammation) of the epididymis. The epididymis is a cord-like structure that is located along the top and back part of the testicle. It collects and stores sperm from the testicle. This condition can also cause pain and swelling of the testicle and scrotum. Symptoms usually start suddenly (acute epididymitis). Sometimes epididymitis starts gradually and lasts for a while (chronic epididymitis). This type may be harder to treat. What are the causes? In men 76 and younger, this condition is usually caused by a bacterial infection or sexually transmitted disease (STD), such as:  Gonorrhea.  Chlamydia. In men 77 and older who do not have anal sex, this condition is usually caused by bacteria from a  blockage or abnormalities in the urinary system. These can result from:  Having a tube placed into the bladder (urinary catheter).  Having an enlarged or inflamed prostate gland.  Having recent urinary tract surgery. In men who have a condition that weakens the body's defense system (immune system), such as HIV, this condition can be caused by:  Other bacteria, including tuberculosis and syphilis.  Viruses.  Fungi. Sometimes this condition occurs without infection. That may happen if urine flows backward into the epididymis after heavy lifting or straining. What increases the risk? This condition is more likely to develop in men:  Who have unprotected sex with more than one partner.  Who have anal sex.  Who have recently had surgery.  Who have a urinary catheter.  Who have urinary problems.  Who have a suppressed immune system. What are the signs or symptoms? This condition usually begins suddenly with chills, fever, and pain behind the scrotum and in the testicle. Other symptoms include:  Swelling of the scrotum, testicle, or both.  Pain whenejaculatingor urinating.  Pain in the back or belly.  Nausea.  Itching and discharge from the penis.  Frequent need to pass urine.  Redness and tenderness of the scrotum. How is this diagnosed? Your health care provider can diagnose this condition based on your symptoms and medical history. Your health care provider will also do a physical exam to ask about your symptoms and check your scrotum and testicle for swelling, pain, and redness. You may also have other tests, including:  Examination of discharge from the penis.  Urine tests for infections, such as STDs. Your health care provider may test you for other STDs, including HIV. How is this treated? Treatment for this condition depends on the cause. If your condition is caused by a bacterial infection, oral antibiotic medicine may be prescribed. If the bacterial  infection has spread to your blood, you may need to receive IV antibiotics. Nonbacterial epididymitis is treated with home care that includes bed rest and elevation of the scrotum. Surgery may be needed to treat:  Bacterial epididymitis that causes pus to build up in the scrotum (abscess).  Chronic epididymitis that has not responded to other treatments. Follow these instructions at home: Medicines   Take over-the-counter and prescription medicines only as told by your health care provider.  If you were prescribed an antibiotic medicine, take it as told by your health care provider. Do not stop taking the antibiotic even if your condition improves. Sexual Activity   If your epididymitis was caused by an STD, avoid sexual activity until your treatment is complete.  Inform your sexual partner or partners if you test positive for an STD. They may need to be treated.Do not engage in sexual activity with your partner or partners until their treatment is completed. General instructions   Return to your normal activities as told by your health care provider. Ask your health care provider what activities are safe for you.  Keep your scrotum elevated and supported while resting. Ask your health care provider if you should wear a scrotal support, such as a jockstrap. Wear it as told by your health care provider.  If directed, apply ice to the affected area:  Put ice in a plastic bag.  Place a towel between your skin and the bag.  Leave the ice on for 20 minutes, 2-3 times per day.  Try taking a sitz bath to help with discomfort. This is a warm water bath that is taken while you are sitting down. The water should only come up to your hips and should cover your buttocks. Do this 3-4 times per day or as told by your health care provider.  Keep all follow-up visits as told by your health care provider. This is important. Contact a health care provider if:  You have a fever.  Your pain  medicine is not helping.  Your pain is getting worse.  Your symptoms do not improve within three days. This information is not intended to replace advice given to you by your health care provider. Make sure you discuss any questions you have with your health care provider. Document Released: 10/19/2000 Document Revised: 03/29/2016 Document Reviewed: 03/09/2015 Elsevier Interactive Patient Education  2017 [employer].   IF you received an x-ray today, you will receive an invoice from [facility]. Please contact [facility] at [phone] with questions or concerns regarding your invoice.   IF you received labwork today, you will receive an invoice from [place]. Please contact [facility] at [phone] with questions or concerns regarding your invoice.   Our billing staff will not be able to assist you with questions regarding bills from these companies.  You will be contacted with the lab results as soon as they are available. The fastest way to get your results is to activate your My Chart account. Instructions are located on the last page of this paperwork. If you have not heard from [place] regarding the results in 2 weeks, please contact this office.

## 2017-03-07 NOTE — Progress Notes (Signed)
By signing my name below, I, Randall Reed, attest that this documentation has been prepared under the direction and in the presence of [name], MD.  Electronically Signed: Verlee Monte, Medical Scribe. 03/07/17. 4:46 PM.  Subjective:    Patient ID: Randall Reed., male    DOB: 1971-01-06, 46 y.o.   MRN: [id number]  HPI Chief Complaint  Patient presents with  . Follow-up    6 week    HPI Comments: Randall Reed. is a 46 y.o. male who presents to Primary Care at [facility] for follow-up. He was a new pt with me in Jan after previously followed by Dr. Laney Pastor.  Migraine HAs: Prev 5-6x per month with maxalt PRN. We've attempted SSRI with zoloft, see emails regarding side effects with zoloft so he stopped that medication. He had worse reflux, worse alternation in bowel movements from diarrhea to constipation. Feeling jittery and shaky and gritting his teeth. He was referred to Dr. Domingo Cocking, [place] specialist at [facility].  He was started on zonisamide.  We had discussed possible HA specialist in [place] as he is living there at this time.  Pt is not taking zonisamide daily since he didn't want to come back to [facility] every 2 weeks for a series of injections over 12 weeks and due to financial conerns. Pt has took 3 maxalt in the past month. Plans on finding name with headache specialist in [place]  GERD: Prev on 2 separate PPIs. He has seen Dr. [name] recently. Remained on bentyl for IBS and nexium BID. No other medication changes for IBS or GERD sxs.  Anxiety with insomnia: He has been treated with xanax 1 mg TID (sometimes BID, not always TID), as well as ambien QHS for some time. We had discussed a repeat sleep study as rev history of sleep apnea prior to weight loss. He did reports at previous visit intolerance to multiple SSRIs. Attempted zoloft as above with worsening GI issues. Now takes 1-2 xanax a day and he's able to space it out. Still taking xanax and  ambien at night, he has tried to alternate with just 1 at a time. Notes Lorrin Mais helps him sleep at night which prevents HAs. Notes if he's having a stressful afternoon he'll take it then but will not take one at night to sleep.  Asthma: See prior visits. He was continued on advair, singular PRN, and albuterol PRN. Pt has been taking 1 puff of advair QD. Symptoms controlled  Testicular Pain: Atraumatic right testicle pain, onset 6 days ago. Initially it was sore and locates the pain on the top section of his testicles. No known injury Reports associated sxs of testicle not lowering but it will occasionally drop slightly, and there is radiating pain up his groin area. Usually wears boxer briefs but over the weekend he didn't wear underwear and tried hot baths without relief of his sxs. Report history of unprotected anal intercourse over the past month.  In the past, pt had reduction in testicular size, but nothing similar to current sxs, and has discussed this with his endocrinologist.  He takes Truvada daily for preexposure HIV prophylaxis. Pt denies side effects from truvada. Denies dysuria, hematuria, and hematospermia.  Allergies: Pt had a bad sinus infection with severe congestion and was put on augmentin and tessalon pearls 02/13/17. Pt continues to have congestion. 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 Pt saw a surgeon 2 years ago and would like to get a referral to get it seen again. Reports sharp pain with cough.  Right Ear: Reports discomfort after swimming.  Patient Active Problem List   Diagnosis Date Noted  . Irritable bowel syndrome 03/14/2016  . Asthma 11/19/2015  . 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 rotator cuff tear 10/17/2013  . Hypogonadism male 09/04/2013  . ED (erectile dysfunction) 07/30/2013  . Palpitations 02/21/2012  . BMI  33.0-33.9,adult 02/04/2012  . Allergic rhinitis 02/04/2012  . GERD (gastroesophageal reflux disease) 02/04/2012  . Migraine 02/04/2012  . GAD (generalized anxiety disorder) 02/04/2012  . Insomnia 02/04/2012  . HYPERTENSION, BENIGN 04/13/2009  . CHEST PAIN-UNSPECIFIED 04/13/2009   Past Medical History:  Diagnosis Date  . Anxiety   . Arthritis   . Asthma   . Depression   . GERD (gastroesophageal reflux disease)   . Seasonal allergic reaction    Past Surgical History:  Procedure Laterality Date  . none    . none     Allergies  Allergen Reactions  . Desloratadine Other (See Comments)    CLARINEX-"severe headache"  . Hydrocod Polst-Cpm Polst Er Itching  . Loratadine Other (See Comments)    "severe headache"  . Tussionex Pennkinetic Er [Hydrocod Polst-Cpm Polst Er] Itching  . Levbid [Hyoscyamine Sulfate] Rash  . Telithromycin Rash   Prior to Admission medications   Medication Sig Start Date End Date Taking? Authorizing Provider  acetic acid-hydrocortisone (VOSOL-HC) otic solution Place 3 drops into both ears 3 (three) times daily. Use as needed to prevent swimmer's ear 01/10/15   Leandrew Koyanagi, MD  albuterol (PROVENTIL HFA;VENTOLIN HFA) 108 (90 Base) MCG/ACT inhaler Inhale 2 puffs into the lungs every 6 (six) hours as needed for wheezing or shortness of breath. 04/20/16   Leandrew Koyanagi, MD  ALPRAZolam [name]) 1 MG tablet TAKE ONE TABLET BY MOUTH THREE TIMES DAILY 03/06/17   [name], MD  amLODipine (NORVASC) 10 MG tablet Take 1 tablet (10 mg total) by mouth daily. 10/05/16   Sherren Mocha, MD  aspirin EC 81 MG tablet Take 1 tablet (81 mg total) by mouth daily. 03/07/15   Sherren Mocha, MD  benzonatate (TESSALON) 100 MG capsule Take 1 capsule (100 mg total) by mouth 3 (three) times daily as needed for cough. 01/21/17   [name], MD  cyclobenzaprine (FLEXERIL) 10 MG tablet Take 1 tablet (10 mg total) by mouth 3 (three) times daily as needed for muscle spasms.  04/20/16   Leandrew Koyanagi, MD  diclofenac (VOLTAREN) 75 MG EC tablet Take 75 mg by mouth 2 (two) times daily as needed (pain).    Historical Provider, MD  dicyclomine (BENTYL) 20 MG tablet Take 1 tablet (20 mg total) by mouth every 6 (six) hours. As needed for symptoms 11/29/16   [name], MD  esomeprazole (NEXIUM) 40 MG capsule Take 1 capsule (40 mg total) by mouth daily before breakfast. 04/25/15   Leandrew Koyanagi, MD  Fexofenadine HCl (ALLEGRA PO) Take 1 tablet by mouth daily.     Historical Provider, MD  fluticasone (FLONASE) 50 MCG/ACT nasal spray INSTILL 2 SPRAYS IN EACH NOSTRIL DAILY 04/20/16   Leandrew Koyanagi, MD  Fluticasone-Salmeterol (ADVAIR) 250-50 MCG/DOSE AEPB Inhale 1 puff into the lungs 2 (two) times daily as needed (allergies/wheezing/sob).    Historical Provider, MD  ipratropium (ATROVENT HFA) 17 MCG/ACT inhaler INHALE 2 PUFFS INTO THE LUNGS TWICE DAILY OR  AS DIRECTED 04/20/16   Leandrew Koyanagi, MD  ipratropium (ATROVENT) 0.06 % nasal spray Place 2 sprays into the nose 2 (two) times daily. 11/29/16   [name], MD  ketoprofen (ORUDIS) 75 MG capsule Take 1 capsule (75 mg total) by mouth 4 (four) times daily as needed for mild pain. 04/20/16   Leandrew Koyanagi, MD  montelukast (SINGULAIR) 10 MG tablet Take 1 tablet (10 mg total) by mouth at bedtime. 04/20/16   Leandrew Koyanagi, MD  pantoprazole (PROTONIX) 40 MG tablet Take 1 tablet (40 mg total) by mouth daily. Patient not taking: Reported on 01/21/2017 08/30/16   Wardell Honour, MD  PROCTOZONE-HC 2.5 % rectal cream USE RECTALLY TWICE DAILY 02/06/17   [name], MD  rizatriptan (MAXALT-MLT) 10 MG disintegrating tablet TAKE AS DIRECTED 04/20/16   Leandrew Koyanagi, MD  sertraline (ZOLOFT) 50 MG tablet Take 1 tablet (50 mg total) by mouth daily. 01/21/17   [name], MD  tadalafil (CIALIS) 20 MG tablet Take 1 tablet (20 mg total) by mouth daily as needed for erectile dysfunction. 10/05/16   Sherren Mocha, MD  TRUVADA 200-300 MG tablet TAKE 1 TABLET BY MOUTH DAILY 02/25/17   [name], MD  valsartan (DIOVAN) 80 MG tablet TAKE 1 TABLET(80 MG) BY MOUTH DAILY 10/05/16   Sherren Mocha, MD  zolpidem (AMBIEN) 10 MG tablet TAKE ONE TABLET BY MOUTH EVERY NIGHT AT BEDTIME 03/06/17   [name], MD   Social History   Social History  . Marital status: Single    Spouse name: N/A  . Number of children: 0  . Years of education: N/A   Occupational History  . [employer])    Social History Main Topics  . Smoking status: Never Smoker  . Smokeless tobacco: Never Used  . Alcohol use 1.8 oz/week    3 Standard drinks or equivalent per week  . Drug use: No  . Sexual activity: Yes   Other Topics Concern  . Not on file   Social History Narrative  . No narrative on file   Review of Systems  HENT: Positive for congestion.   Genitourinary: Positive for testicular pain. Negative for dysuria, hematuria, penile swelling and scrotal swelling.  Allergic/Immunologic: Positive for environmental allergies.  Neurological: Positive for headaches.  Psychiatric/Behavioral: The patient is nervous/anxious.     Objective:  Physical Exam  Constitutional: He is oriented to person, place, and time. He appears well-developed and well-nourished.  HENT:  Head: Normocephalic and atraumatic.  Right Ear: Tympanic membrane, external ear and ear canal normal.  Left Ear: Tympanic membrane, external ear and ear canal normal.  Nose: No rhinorrhea.  Mouth/Throat: Oropharynx is clear and moist and mucous membranes are normal. No oropharyngeal exudate or posterior oropharyngeal erythema.  Eyes: Conjunctivae are normal. Pupils are equal, round, and reactive to light.  Neck: Neck supple.  Cardiovascular: Normal rate, regular rhythm, normal heart sounds and intact distal pulses.  Exam reveals no gallop and no friction rub.   No murmur heard. Pulmonary/Chest: Effort normal and  breath sounds normal. No respiratory distress. He has no wheezes. He has no rhonchi. He has no rales.  Abdominal: Soft. He exhibits mass. There is no tenderness. Hernia confirmed negative in the right inguinal area and confirmed negative in the left inguinal area.  fullness on the right abdominal wall  Genitourinary: Right testis shows tenderness. Right testis shows no mass. Left testis shows no mass.  Genitourinary  Comments: Minimal tendeness at the right epididymis  Lymphadenopathy:    He has no cervical adenopathy.  Neurological: He is alert and oriented to person, place, and time.  Skin: Skin is warm and dry. No rash noted.  Psychiatric: He has a normal mood and affect. His behavior is normal.  Vitals reviewed.   Vitals:   03/07/17 1607  BP: 135/82  Pulse: (!) 116  Resp: 18  Temp: 98.5 F (36.9 C)  TempSrc: Oral  SpO2: 96%  Weight: 255 lb 12.8 oz (116 kg)  Height: 5' 10.67" (1.795 m)   Assessment & Plan:  Over 40 mins of face-to-face care, over 50% counseling with addition chart review from outside notes.   Randall Reed. is a 46 y.o. male Right testicular pain - Plan: US Scrotum, [place] Art/Ven Flow Abd Pelv Doppler, POCT urinalysis dipstick, Urine Microscopic, ciprofloxacin (CIPRO) 500 MG tablet, US Scrotum, [place] Art/Ven Flow Abd Pelv Doppler Epididymitis - Plan: ciprofloxacin (CIPRO) 500 MG tablet, US Scrotum, [place] Art/Ven Flow Abd Pelv Doppler  - Minimally tender at epididymis. Symptoms somewhat improved, but possible epididymitis, doubt torsion. Will arrange ultrasound, start Cipro 500 mg twice a day for now, check STI testing with history of unprotected intercourse.  Unprotected sexual intercourse - Plan: RPR, HIV antibody, GC/Chlamydia Probe Amp  -Safer sex practices/condoms every time discussed. STI testing performed  Allergic rhinitis, unspecified seasonality, unspecified trigger  -Continue Atrovent, antihistamine, but increase steroid nasal spray to 2 sprays per  nostril each day  Other migraine without status migrainosus, not intractable  -Advised him to let me know of headache specialist in [place] he would like to see and I will make a referral. Has Maxalt for now if needed for breakthrough symptoms  Generalized anxiety disorder, Insomnia, unspecified type  -Intolerant to Zoloft. Would consider other SSRI, but for now as he is titrating down somewhat on Xanax, we'll continue same regimen. Still cautioned on combination of Xanax and Ambien at night.  Mild intermittent asthma without complication  - Stable on Advair. Dosing once per day, so potential for inhaled corticosteroid by itself, but plans on continuing same regimen for now.  Abdominal wall mass  -Suspected lipoma previously. He has been examined by surgeon in past, so may be able to schedule another appointment. Advised let me know if referral needed.  Meds ordered this encounter  Medications  . testosterone cypionate (DEPOTESTOTERONE CYPIONATE) 100 MG/ML injection    Sig: Inject 200 mg into the muscle every 14 (fourteen) days. For IM use only  . ciprofloxacin (CIPRO) 500 MG tablet    Sig: Take 1 tablet (500 mg total) by mouth 2 (two) times daily.    Dispense:  20 tablet    Refill:  0   Patient Instructions   For allergies: Increase Flonase to 2 sprays per nostril each day. Atrovent nasal spray up to 2 times per day. Continue allegra once per day. If still having trouble with allergies, can refer you to allergist to look into other options if needed. Would avoid prednisone for now.   I do recommend follow up with headache specialist - please let me know who you would like me to refer you to in [place]. Continue Maxalt for migraine if needed for now.   OK to continue xanax with goal of weaning down to lowest effective dose.I would still caution on combining the xanax with Ambien at night. If still requiring this combo, or persistent daily use in next few months, would recommend  different  SSRI, Wellbutrin, or meeting with psychiatrist.   Call [facility] to follow up on the abdominal wall mass/abnormality. [phone].  You may not need a referral as they have seen you previously. Let me know if they do need a referral and I can send one in.   If asthma well controlled on once daily Advair, we have option of decreasing to inhaled steroid only.   Return to the clinic or go to the nearest emergency room if any of your symptoms worsen or new symptoms occur.   Epididymitis Epididymitis is swelling (inflammation) of the epididymis. The epididymis is a cord-like structure that is located along the top and back part of the testicle. It collects and stores sperm from the testicle. This condition can also cause pain and swelling of the testicle and scrotum. Symptoms usually start suddenly (acute epididymitis). Sometimes epididymitis starts gradually and lasts for a while (chronic epididymitis). This type may be harder to treat. What are the causes? In men 69 and younger, this condition is usually caused by a bacterial infection or sexually transmitted disease (STD), such as:  Gonorrhea.  Chlamydia. In men 54 and older who do not have anal sex, this condition is usually caused by bacteria from a blockage or abnormalities in the urinary system. These can result from:  Having a tube placed into the bladder (urinary catheter).  Having an enlarged or inflamed prostate gland.  Having recent urinary tract surgery. In men who have a condition that weakens the body's defense system (immune system), such as HIV, this condition can be caused by:  Other bacteria, including tuberculosis and syphilis.  Viruses.  Fungi. Sometimes this condition occurs without infection. That may happen if urine flows backward into the epididymis after heavy lifting or straining. What increases the risk? This condition is more likely to develop in men:  Who have unprotected sex with  more than one partner.  Who have anal sex.  Who have recently had surgery.  Who have a urinary catheter.  Who have urinary problems.  Who have a suppressed immune system. What are the signs or symptoms? This condition usually begins suddenly with chills, fever, and pain behind the scrotum and in the testicle. Other symptoms include:  Swelling of the scrotum, testicle, or both.  Pain whenejaculatingor urinating.  Pain in the back or belly.  Nausea.  Itching and discharge from the penis.  Frequent need to pass urine.  Redness and tenderness of the scrotum. How is this diagnosed? Your health care provider can diagnose this condition based on your symptoms and medical history. Your health care provider will also do a physical exam to ask about your symptoms and check your scrotum and testicle for swelling, pain, and redness. You may also have other tests, including:  Examination of discharge from the penis.  Urine tests for infections, such as STDs. Your health care provider may test you for other STDs, including HIV. How is this treated? Treatment for this condition depends on the cause. If your condition is caused by a bacterial infection, oral antibiotic medicine may be prescribed. If the bacterial infection has spread to your blood, you may need to receive IV antibiotics. Nonbacterial epididymitis is treated with home care that includes bed rest and elevation of the scrotum. Surgery may be needed to treat:  Bacterial epididymitis that causes pus to build up in the scrotum (abscess).  Chronic epididymitis that has not responded to other treatments. Follow these instructions at home: Medicines   Take over-the-counter  and prescription medicines only as told by your health care provider.  If you were prescribed an antibiotic medicine, take it as told by your health care provider. Do not stop taking the antibiotic even if your condition improves. Sexual Activity   If  your epididymitis was caused by an STD, avoid sexual activity until your treatment is complete.  Inform your sexual partner or partners if you test positive for an STD. They may need to be treated.Do not engage in sexual activity with your partner or partners until their treatment is completed. General instructions   Return to your normal activities as told by your health care provider. Ask your health care provider what activities are safe for you.  Keep your scrotum elevated and supported while resting. Ask your health care provider if you should wear a scrotal support, such as a jockstrap. Wear it as told by your health care provider.  If directed, apply ice to the affected area:  Put ice in a plastic bag.  Place a towel between your skin and the bag.  Leave the ice on for 20 minutes, 2-3 times per day.  Try taking a sitz bath to help with discomfort. This is a warm water bath that is taken while you are sitting down. The water should only come up to your hips and should cover your buttocks. Do this 3-4 times per day or as told by your health care provider.  Keep all follow-up visits as told by your health care provider. This is important. Contact a health care provider if:  You have a fever.  Your pain medicine is not helping.  Your pain is getting worse.  Your symptoms do not improve within three days. This information is not intended to replace advice given to you by your health care provider. Make sure you discuss any questions you have with your health care provider. Document Released: 10/19/2000 Document Revised: 03/29/2016 Document Reviewed: 03/09/2015 Elsevier Interactive Patient Education  2017 [employer].   IF you received an x-ray today, you will receive an invoice from [facility]. Please contact [facility] at [phone] with questions or concerns regarding your invoice.   IF you received labwork today, you will receive an invoice from  [place]. Please contact [facility] at [phone] with questions or concerns regarding your invoice.   Our billing staff will not be able to assist you with questions regarding bills from these companies.  You will be contacted with the lab results as soon as they are available. The fastest way to get your results is to activate your My Chart account. Instructions are located on the last page of this paperwork. If you have not heard from [place] regarding the results in 2 weeks, please contact this office.       I personally performed the services described in this documentation, which was scribed in my presence. The recorded information has been reviewed and considered for accuracy and completeness, addended by me as needed, and agree with information above.  Signed,   [name], MD [facility] at [place].  03/10/17 11:14 AM

## 2017-03-08 ENCOUNTER — Ambulatory Visit (HOSPITAL_COMMUNITY): Payer: BLUE CROSS/BLUE SHIELD

## 2017-03-08 DIAGNOSIS — I861 Scrotal varices: Secondary | ICD-10-CM | POA: Diagnosis not present

## 2017-03-08 DIAGNOSIS — N50811 Right testicular pain: Secondary | ICD-10-CM | POA: Diagnosis not present

## 2017-03-08 LAB — URINALYSIS, MICROSCOPIC ONLY
Bacteria, UA: NONE SEEN
Casts: NONE SEEN /lpf
Epithelial Cells (non renal): NONE SEEN /hpf (ref 0–10)

## 2017-03-08 NOTE — Progress Notes (Signed)
u/s set up at [facility] 03/08/17 1pm. Pt Aware

## 2017-03-09 ENCOUNTER — Telehealth: Payer: Self-pay

## 2017-03-09 LAB — GC/CHLAMYDIA PROBE AMP
Chlamydia trachomatis, NAA: NEGATIVE
NEISSERIA GONORRHOEAE BY PCR: NEGATIVE

## 2017-03-09 NOTE — Telephone Encounter (Signed)
I called pt to f/u on [place] results. N/A  L/M to call to touch base if exam was done  If any info was given to patient  We will call [facility] Monday for report and touch base then   Stay on antibiotics for now    [email or web address]  [phone]

## 2017-03-09 NOTE — Telephone Encounter (Signed)
Pt called back and did have the test and did not get result  He asked about lab and I said so far they look ok

## 2017-03-10 LAB — HIV ANTIBODY (ROUTINE TESTING W REFLEX): HIV Screen 4th Generation wRfx: NONREACTIVE

## 2017-03-10 LAB — RPR: RPR: NONREACTIVE

## 2017-03-11 DIAGNOSIS — R05 Cough: Secondary | ICD-10-CM | POA: Diagnosis not present

## 2017-03-11 DIAGNOSIS — R0781 Pleurodynia: Secondary | ICD-10-CM | POA: Diagnosis not present

## 2017-03-11 NOTE — Telephone Encounter (Signed)
[address], [place], [place] [address]  Hours:  Closed ? [place] Tue                       Phone: [phone]

## 2017-03-12 NOTE — Telephone Encounter (Signed)
Advised of normal ultrasound, mild varicocele. Advised of lab results - all reassuring. Testicle pain is improving. Finish Cipro, rtc precautions if worse/recurrent pain.   Additionally stated he had some some increased cough past few days.  Seen by Urgent care in [facility] yesterday for cough. He reports that he feels wheezing, but provider did not hear wheeze. Told he had strained muscles. Plans on increasing his Adviar to twice per day. ER/RTC precautions given if increased albuterol need or persistent use.

## 2017-03-12 NOTE — Telephone Encounter (Signed)
Report is in your box.

## 2017-03-14 DIAGNOSIS — R918 Other nonspecific abnormal finding of lung field: Secondary | ICD-10-CM | POA: Diagnosis not present

## 2017-03-14 DIAGNOSIS — R1013 Epigastric pain: Secondary | ICD-10-CM | POA: Diagnosis not present

## 2017-03-14 DIAGNOSIS — R05 Cough: Secondary | ICD-10-CM | POA: Diagnosis not present

## 2017-03-14 DIAGNOSIS — R0781 Pleurodynia: Secondary | ICD-10-CM | POA: Diagnosis not present

## 2017-03-14 DIAGNOSIS — S29011A Strain of muscle and tendon of front wall of thorax, initial encounter: Secondary | ICD-10-CM | POA: Diagnosis not present

## 2017-04-08 ENCOUNTER — Other Ambulatory Visit: Payer: Self-pay | Admitting: Family Medicine

## 2017-04-08 DIAGNOSIS — R0781 Pleurodynia: Secondary | ICD-10-CM | POA: Diagnosis not present

## 2017-04-08 DIAGNOSIS — R1013 Epigastric pain: Secondary | ICD-10-CM | POA: Diagnosis not present

## 2017-04-08 DIAGNOSIS — S29011A Strain of muscle and tendon of front wall of thorax, initial encounter: Secondary | ICD-10-CM | POA: Diagnosis not present

## 2017-04-09 DIAGNOSIS — M999 Biomechanical lesion, unspecified: Secondary | ICD-10-CM | POA: Diagnosis not present

## 2017-04-10 DIAGNOSIS — R0781 Pleurodynia: Secondary | ICD-10-CM | POA: Diagnosis not present

## 2017-04-10 NOTE — Telephone Encounter (Signed)
Refilled during ov 04/06/16

## 2017-04-11 NOTE — Telephone Encounter (Signed)
03/06/17 last refill

## 2017-04-12 NOTE — Telephone Encounter (Signed)
03/06/17 last refill and ov

## 2017-04-13 NOTE — Telephone Encounter (Signed)
Refilled - ok to fax to pharmacy if needed.

## 2017-04-15 ENCOUNTER — Encounter: Payer: Self-pay | Admitting: Family Medicine

## 2017-04-16 ENCOUNTER — Other Ambulatory Visit: Payer: Self-pay | Admitting: Family Medicine

## 2017-04-16 DIAGNOSIS — F411 Generalized anxiety disorder: Secondary | ICD-10-CM

## 2017-04-17 ENCOUNTER — Other Ambulatory Visit: Payer: Self-pay | Admitting: Family Medicine

## 2017-04-17 DIAGNOSIS — F411 Generalized anxiety disorder: Secondary | ICD-10-CM

## 2017-04-17 NOTE — Telephone Encounter (Signed)
Printed Rx was called in for Alprazolam 1mg  Qty #60 one tablet by mouth 2 times daily as needed. No Refills. To [facility] [phone], Pt notified

## 2017-04-17 NOTE — Telephone Encounter (Signed)
See my chart message. He has been trying to taper down to 1-2 per day. Will provide #60 as may need second dose at times. Will sign to be faxed tomorrow.

## 2017-04-19 ENCOUNTER — Other Ambulatory Visit: Payer: Self-pay | Admitting: Family Medicine

## 2017-04-24 DIAGNOSIS — R0781 Pleurodynia: Secondary | ICD-10-CM | POA: Diagnosis not present

## 2017-04-24 DIAGNOSIS — K2950 Unspecified chronic gastritis without bleeding: Secondary | ICD-10-CM | POA: Diagnosis not present

## 2017-04-24 DIAGNOSIS — R10812 Left upper quadrant abdominal tenderness: Secondary | ICD-10-CM | POA: Diagnosis not present

## 2017-04-24 DIAGNOSIS — S29011D Strain of muscle and tendon of front wall of thorax, subsequent encounter: Secondary | ICD-10-CM | POA: Diagnosis not present

## 2017-04-30 ENCOUNTER — Other Ambulatory Visit: Payer: Self-pay | Admitting: *Deleted

## 2017-04-30 DIAGNOSIS — J301 Allergic rhinitis due to pollen: Secondary | ICD-10-CM

## 2017-04-30 MED ORDER — RIZATRIPTAN BENZOATE 10 MG PO TBDP: ORAL_TABLET | ORAL | 1 refills | Status: DC

## 2017-04-30 MED ORDER — MONTELUKAST SODIUM 10 MG PO TABS: 10.0000 mg | ORAL_TABLET | Freq: Every day | ORAL | 6 refills | Status: DC

## 2017-05-06 ENCOUNTER — Telehealth: Payer: Self-pay | Admitting: Family Medicine

## 2017-05-06 DIAGNOSIS — R10812 Left upper quadrant abdominal tenderness: Secondary | ICD-10-CM | POA: Diagnosis not present

## 2017-05-06 NOTE — Telephone Encounter (Signed)
[facility] pharmacy is calling for clarification on  Rizatriptan  Best number for [facility] [phone]

## 2017-05-07 ENCOUNTER — Telehealth: Payer: Self-pay

## 2017-05-07 NOTE — Telephone Encounter (Signed)
The pharmacy called to clarify directions on the Maxalt, the Rx states use as directed, Please Advise.

## 2017-05-07 NOTE — Telephone Encounter (Signed)
Dr. Carlota Raspberry, please see this message , the pharmacist need specific directions. Thanks.

## 2017-05-08 NOTE — Telephone Encounter (Signed)
1 by mouth as needed for migraine.

## 2017-05-09 ENCOUNTER — Other Ambulatory Visit: Payer: Self-pay

## 2017-05-09 NOTE — Telephone Encounter (Signed)
Pt is requesting Advair Diskus 250/50 MCG refill however prescribing physician is no longer here. Pt was last seen on 03/07/2017 with Dr. Carlota Raspberry. Please advise.

## 2017-05-10 ENCOUNTER — Telehealth: Payer: Self-pay | Admitting: *Deleted

## 2017-05-10 MED ORDER — FLUTICASONE-SALMETEROL 250-50 MCG/DOSE IN AEPB: 1.0000 | INHALATION_SPRAY | Freq: Two times a day (BID) | RESPIRATORY_TRACT | 0 refills | Status: DC | PRN

## 2017-05-10 NOTE — Telephone Encounter (Deleted)
Rx refills denied for Advair, Singular, Rizatriptan ODT pt needs OV,

## 2017-05-10 NOTE — Telephone Encounter (Signed)
Called the pharmacy , and clarified the directions.

## 2017-05-10 NOTE — Telephone Encounter (Signed)
Ok to refill, but will need to verify which pharmacy as sometimes uses [place] location.

## 2017-05-10 NOTE — Telephone Encounter (Signed)
Rx request refills denied on 05/07/17. Pt needs ov

## 2017-05-13 ENCOUNTER — Other Ambulatory Visit: Payer: Self-pay | Admitting: Family Medicine

## 2017-05-13 DIAGNOSIS — F411 Generalized anxiety disorder: Secondary | ICD-10-CM

## 2017-05-13 DIAGNOSIS — K580 Irritable bowel syndrome with diarrhea: Secondary | ICD-10-CM

## 2017-05-15 ENCOUNTER — Other Ambulatory Visit: Payer: Self-pay | Admitting: Family Medicine

## 2017-05-15 ENCOUNTER — Encounter: Payer: Self-pay | Admitting: Family Medicine

## 2017-05-15 DIAGNOSIS — F411 Generalized anxiety disorder: Secondary | ICD-10-CM

## 2017-05-16 ENCOUNTER — Other Ambulatory Visit: Payer: Self-pay | Admitting: Family Medicine

## 2017-05-16 DIAGNOSIS — S29012A Strain of muscle and tendon of back wall of thorax, initial encounter: Secondary | ICD-10-CM | POA: Diagnosis not present

## 2017-05-16 DIAGNOSIS — M9901 Segmental and somatic dysfunction of cervical region: Secondary | ICD-10-CM | POA: Diagnosis not present

## 2017-05-17 ENCOUNTER — Telehealth: Payer: Self-pay

## 2017-05-17 NOTE — Telephone Encounter (Signed)
Pharmacy faxed a Rx request for Fluticasone 77mcg, nasal spray and dicyclomine 20 mg tablets, patient last seen 03/07/2017

## 2017-05-17 NOTE — Telephone Encounter (Signed)
Pt states that he takes his last pill today.

## 2017-05-18 MED ORDER — DICYCLOMINE HCL 20 MG PO TABS: 20.0000 mg | ORAL_TABLET | Freq: Four times a day (QID) | ORAL | 1 refills | Status: DC

## 2017-05-18 NOTE — Telephone Encounter (Signed)
Refilled Xanax, Ambien, Bentyl.. Preferred pharmacy is [facility] in [place] on [address]

## 2017-05-20 ENCOUNTER — Telehealth: Payer: Self-pay | Admitting: Cardiovascular Disease

## 2017-05-20 ENCOUNTER — Telehealth: Payer: Self-pay | Admitting: *Deleted

## 2017-05-20 ENCOUNTER — Encounter: Payer: Self-pay | Admitting: Cardiovascular Disease

## 2017-05-20 MED ORDER — LOSARTAN POTASSIUM 100 MG PO TABS: 100.0000 mg | ORAL_TABLET | Freq: Every day | ORAL | 3 refills | Status: DC

## 2017-05-20 MED ORDER — EMTRICITABINE-TENOFOVIR DF 200-300 MG PO TABS: 1.0000 | ORAL_TABLET | Freq: Every day | ORAL | 1 refills | Status: DC

## 2017-05-20 NOTE — Telephone Encounter (Signed)
PER DR SKAINS CHANGE TO  LOSARTAN   AND SINCE  B/P  IS  STILL ELEVATED WILL HAVE PT  TAKE  100 MG  AND  KEEP B/P LOG AND  CALL WITH READINGS .Adonis Housekeeper

## 2017-05-20 NOTE — Telephone Encounter (Signed)
I have refilled Truvada as he had STI testing in May and last renal function was in February. Plan for STI testing approximately every 3 months and renal function testing every 6 months. Should follow-up in August or September at the latest. Will send Mychart message to pt.

## 2017-05-20 NOTE — Telephone Encounter (Signed)
New message      Pt c/o medication issue:  1. Name of Medication: valsartan 2. How are you currently taking this medication (dosage and times per day)?  Pt not sure.  He did not have the bottle nearby 3. Are you having a reaction (difficulty breathing--STAT)?  no 4. What is your medication issue?  Pt states that he saw on the news that this medication has been recalled.

## 2017-05-20 NOTE — Telephone Encounter (Signed)
DISCUSSED WITH [facility]  AND MED  LISTED  HAS BEEN  CONTAMINATED WITH  CANCER CAUSING AGENT  BUT  ONLY INVOLVING  A MANUFACTURER OUT OF [place]  UNLIKELY  AFFECTING  ANYONE IN [place] PER PT  REVIEWED BOTTLE  AND  THOUGHT  THAT SAID  CO  WAS ON BOTTLE  INSTRUCTED FOR  PT  TO CALL PHARMACY TO SEE ABOUT GETTING MED REPLACED IF IN FACT HAS  CONTAMINATED  PILLS. PT  AGREES .Adonis Housekeeper

## 2017-05-20 NOTE — Telephone Encounter (Signed)
New message       Pt c/o medication issue:  1. Name of Medication: valsartan 2. How are you currently taking this medication (dosage and times per day)?  80mg  daily 3. Are you having a reaction (difficulty breathing--STAT)? no 4. What is your medication issue?  Medication has been recalled and pharmacy cannot get it from a different manufacturer.  Please advise

## 2017-05-21 ENCOUNTER — Other Ambulatory Visit: Payer: Self-pay | Admitting: *Deleted

## 2017-05-21 DIAGNOSIS — J301 Allergic rhinitis due to pollen: Secondary | ICD-10-CM

## 2017-05-21 MED ORDER — FLUTICASONE PROPIONATE 50 MCG/ACT NA SUSP: NASAL | 4 refills | Status: DC

## 2017-05-21 NOTE — Telephone Encounter (Signed)
Agree thank you 

## 2017-05-25 ENCOUNTER — Other Ambulatory Visit: Payer: Self-pay

## 2017-05-25 MED ORDER — FLUTICASONE-SALMETEROL 250-50 MCG/DOSE IN AEPB: 1.0000 | INHALATION_SPRAY | Freq: Two times a day (BID) | RESPIRATORY_TRACT | 0 refills | Status: DC | PRN

## 2017-05-30 ENCOUNTER — Encounter: Payer: Self-pay | Admitting: Cardiovascular Disease

## 2017-06-10 ENCOUNTER — Other Ambulatory Visit: Payer: Self-pay

## 2017-06-10 MED ORDER — CHLORTHALIDONE 25 MG PO TABS: 25.0000 mg | ORAL_TABLET | Freq: Every day | ORAL | 3 refills | Status: DC

## 2017-06-13 DIAGNOSIS — M19072 Primary osteoarthritis, left ankle and foot: Secondary | ICD-10-CM | POA: Diagnosis not present

## 2017-06-13 DIAGNOSIS — M24811 Other specific joint derangements of right shoulder, not elsewhere classified: Secondary | ICD-10-CM | POA: Diagnosis not present

## 2017-06-13 DIAGNOSIS — M25572 Pain in left ankle and joints of left foot: Secondary | ICD-10-CM | POA: Diagnosis not present

## 2017-06-13 DIAGNOSIS — M7551 Bursitis of right shoulder: Secondary | ICD-10-CM | POA: Diagnosis not present

## 2017-06-13 DIAGNOSIS — M25579 Pain in unspecified ankle and joints of unspecified foot: Secondary | ICD-10-CM | POA: Diagnosis not present

## 2017-06-14 ENCOUNTER — Other Ambulatory Visit: Payer: Self-pay | Admitting: Family Medicine

## 2017-06-14 DIAGNOSIS — F411 Generalized anxiety disorder: Secondary | ICD-10-CM

## 2017-06-18 ENCOUNTER — Encounter: Payer: Self-pay | Admitting: Family Medicine

## 2017-06-19 ENCOUNTER — Other Ambulatory Visit: Payer: Self-pay | Admitting: Family Medicine

## 2017-06-19 DIAGNOSIS — F5104 Psychophysiologic insomnia: Secondary | ICD-10-CM

## 2017-06-19 DIAGNOSIS — F411 Generalized anxiety disorder: Secondary | ICD-10-CM

## 2017-06-19 MED ORDER — ALPRAZOLAM 1 MG PO TABS: 1.0000 mg | ORAL_TABLET | Freq: Two times a day (BID) | ORAL | 0 refills | Status: DC | PRN

## 2017-06-19 MED ORDER — ZOLPIDEM TARTRATE 10 MG PO TABS: 10.0000 mg | ORAL_TABLET | Freq: Every day | ORAL | 0 refills | Status: DC

## 2017-06-19 MED ORDER — EMTRICITABINE-TENOFOVIR DF 200-300 MG PO TABS: 1.0000 | ORAL_TABLET | Freq: Every day | ORAL | 1 refills | Status: DC

## 2017-06-19 NOTE — Progress Notes (Signed)
See my chart message. Office visit in May discussing medications. Had discussed every six-month renal function testing on Truvada with every three-month STI testing for preexposure prophylaxis but reports had been followed every 6 months with previous provider. Email sent regarding recommendations for STI testing, but if he does want to space out to every 6 month testing, can do so. Plan for office visit first part of November. Will be able to refill Ambien and Xanax until that office visit, but see prior notes regarding plan to decrease Xanax frequency as tolerated. If still persistent frequent need of Ambien and Xanax at that visit, would recommend another trial of SSRI or psychiatry eval as discussed last visit.   Additional 2 months of Truvada prescribed as well as Ambien and Xanax for this month.

## 2017-06-20 ENCOUNTER — Telehealth: Payer: Self-pay

## 2017-06-20 NOTE — Telephone Encounter (Signed)
IC pt to check on pharmacy to call 2 prescriptions. Researched prior [employer] and saw he requested [employer].  Called Ambien and Alprazolam prescriptions to [facility] [phone]

## 2017-07-05 ENCOUNTER — Other Ambulatory Visit: Payer: Self-pay | Admitting: Family Medicine

## 2017-07-12 DIAGNOSIS — Z79899 Other long term (current) drug therapy: Secondary | ICD-10-CM | POA: Diagnosis not present

## 2017-07-12 DIAGNOSIS — R7301 Impaired fasting glucose: Secondary | ICD-10-CM | POA: Diagnosis not present

## 2017-07-12 DIAGNOSIS — E230 Hypopituitarism: Secondary | ICD-10-CM | POA: Diagnosis not present

## 2017-07-16 ENCOUNTER — Other Ambulatory Visit: Payer: Self-pay | Admitting: Family Medicine

## 2017-07-16 DIAGNOSIS — F5104 Psychophysiologic insomnia: Secondary | ICD-10-CM

## 2017-07-16 DIAGNOSIS — F411 Generalized anxiety disorder: Secondary | ICD-10-CM

## 2017-07-18 NOTE — Telephone Encounter (Signed)
Disregard last message, I called in Xanax 1mg  #60, no rf, Ambien 10mg  #30, no rf  prescriptions to [facility] on [address]. Paper Rx was placed in shred bin.

## 2017-07-18 NOTE — Telephone Encounter (Signed)
Refilled. Please fax to [facility].

## 2017-08-15 ENCOUNTER — Other Ambulatory Visit: Payer: Self-pay | Admitting: Family Medicine

## 2017-08-15 DIAGNOSIS — F5104 Psychophysiologic insomnia: Secondary | ICD-10-CM

## 2017-08-15 DIAGNOSIS — F411 Generalized anxiety disorder: Secondary | ICD-10-CM

## 2017-08-16 NOTE — Telephone Encounter (Signed)
SS refill req alprazolam & ambien chgd back to [facility] To Dr. Carlota Raspberry

## 2017-08-17 NOTE — Telephone Encounter (Signed)
Refilled, and faxed to [facility].

## 2017-08-26 ENCOUNTER — Other Ambulatory Visit: Payer: Self-pay | Admitting: Cardiovascular Disease

## 2017-08-26 ENCOUNTER — Other Ambulatory Visit: Payer: Self-pay | Admitting: Family Medicine

## 2017-08-26 DIAGNOSIS — I10 Essential (primary) hypertension: Secondary | ICD-10-CM

## 2017-08-26 DIAGNOSIS — K580 Irritable bowel syndrome with diarrhea: Secondary | ICD-10-CM

## 2017-09-01 DIAGNOSIS — Z23 Encounter for immunization: Secondary | ICD-10-CM | POA: Diagnosis not present

## 2017-09-21 ENCOUNTER — Other Ambulatory Visit: Payer: Self-pay | Admitting: Family Medicine

## 2017-09-21 DIAGNOSIS — F5104 Psychophysiologic insomnia: Secondary | ICD-10-CM

## 2017-09-21 DIAGNOSIS — F411 Generalized anxiety disorder: Secondary | ICD-10-CM

## 2017-09-23 ENCOUNTER — Other Ambulatory Visit: Payer: Self-pay | Admitting: Family Medicine

## 2017-09-23 NOTE — Telephone Encounter (Signed)
Refilled for 1 month, but on previous message in August, had planned on follow-up in November. Please have him schedule follow-up appointment. Also please verify that the prescriptions should be faxed to [facility]

## 2017-09-23 NOTE — Telephone Encounter (Signed)
Patient does not have any scheduled appointments.  Please Advise

## 2017-09-23 NOTE — Telephone Encounter (Signed)
Please review refill 

## 2017-09-27 ENCOUNTER — Ambulatory Visit: Payer: BLUE CROSS/BLUE SHIELD | Admitting: Family Medicine

## 2017-09-27 ENCOUNTER — Encounter: Payer: Self-pay | Admitting: Family Medicine

## 2017-09-27 ENCOUNTER — Other Ambulatory Visit: Payer: Self-pay

## 2017-09-27 VITALS — BP 118/82 | HR 102 | Temp 98.5°F | Resp 16 | Ht 71.26 in | Wt 247.0 lb

## 2017-09-27 DIAGNOSIS — G43109 Migraine with aura, not intractable, without status migrainosus: Secondary | ICD-10-CM | POA: Diagnosis not present

## 2017-09-27 DIAGNOSIS — K219 Gastro-esophageal reflux disease without esophagitis: Secondary | ICD-10-CM | POA: Diagnosis not present

## 2017-09-27 DIAGNOSIS — F5104 Psychophysiologic insomnia: Secondary | ICD-10-CM | POA: Diagnosis not present

## 2017-09-27 DIAGNOSIS — J45909 Unspecified asthma, uncomplicated: Secondary | ICD-10-CM | POA: Diagnosis not present

## 2017-09-27 DIAGNOSIS — R739 Hyperglycemia, unspecified: Secondary | ICD-10-CM | POA: Diagnosis not present

## 2017-09-27 DIAGNOSIS — F411 Generalized anxiety disorder: Secondary | ICD-10-CM

## 2017-09-27 DIAGNOSIS — D582 Other hemoglobinopathies: Secondary | ICD-10-CM

## 2017-09-27 DIAGNOSIS — J309 Allergic rhinitis, unspecified: Secondary | ICD-10-CM | POA: Diagnosis not present

## 2017-09-27 DIAGNOSIS — I10 Essential (primary) hypertension: Secondary | ICD-10-CM | POA: Diagnosis not present

## 2017-09-27 DIAGNOSIS — K580 Irritable bowel syndrome with diarrhea: Secondary | ICD-10-CM

## 2017-09-27 DIAGNOSIS — Z113 Encounter for screening for infections with a predominantly sexual mode of transmission: Secondary | ICD-10-CM | POA: Diagnosis not present

## 2017-09-27 MED ORDER — FLUTICASONE-SALMETEROL 250-50 MCG/DOSE IN AEPB: INHALATION_SPRAY | RESPIRATORY_TRACT | 5 refills | Status: DC

## 2017-09-27 MED ORDER — ESOMEPRAZOLE MAGNESIUM 40 MG PO CPDR: 40.0000 mg | DELAYED_RELEASE_CAPSULE | Freq: Two times a day (BID) | ORAL | 2 refills | Status: DC

## 2017-09-27 MED ORDER — DICYCLOMINE HCL 20 MG PO TABS: 20.0000 mg | ORAL_TABLET | Freq: Four times a day (QID) | ORAL | 2 refills | Status: DC | PRN

## 2017-09-27 MED ORDER — ZOLPIDEM TARTRATE 10 MG PO TABS: 10.0000 mg | ORAL_TABLET | Freq: Every day | ORAL | 2 refills | Status: DC

## 2017-09-27 MED ORDER — RIZATRIPTAN BENZOATE 10 MG PO TBDP: ORAL_TABLET | ORAL | 1 refills | Status: DC

## 2017-09-27 MED ORDER — FLUTICASONE PROPIONATE 50 MCG/ACT NA SUSP: NASAL | 4 refills | Status: DC

## 2017-09-27 MED ORDER — ALBUTEROL SULFATE HFA 108 (90 BASE) MCG/ACT IN AERS: 2.0000 | INHALATION_SPRAY | Freq: Four times a day (QID) | RESPIRATORY_TRACT | 1 refills | Status: DC | PRN

## 2017-09-27 MED ORDER — EMTRICITABINE-TENOFOVIR DF 200-300 MG PO TABS: 1.0000 | ORAL_TABLET | Freq: Every day | ORAL | 2 refills | Status: DC

## 2017-09-27 MED ORDER — IPRATROPIUM BROMIDE 0.06 % NA SOLN: 2.0000 | Freq: Two times a day (BID) | NASAL | 9 refills | Status: DC

## 2017-09-27 MED ORDER — TADALAFIL 20 MG PO TABS: 20.0000 mg | ORAL_TABLET | Freq: Every day | ORAL | 0 refills | Status: DC | PRN

## 2017-09-27 MED ORDER — MONTELUKAST SODIUM 10 MG PO TABS: 10.0000 mg | ORAL_TABLET | Freq: Every day | ORAL | 6 refills | Status: DC

## 2017-09-27 MED ORDER — ALPRAZOLAM 1 MG PO TABS: 1.0000 mg | ORAL_TABLET | Freq: Two times a day (BID) | ORAL | 2 refills | Status: DC | PRN

## 2017-09-27 NOTE — Progress Notes (Signed)
Subjective:  By signing my name below, I, [name], attest that this documentation has been prepared under the direction and in the presence of [name], MD. Electronically Signed: [name], [place]. 09/27/2017 , 2:37 PM .  Patient was seen in Room 11 .   Patient ID: Randall Apple., male    DOB: 08-29-1971, 46 y.o.   MRN: [id number] Chief Complaint  Patient presents with  . Anxiety    6 month follow-up.   . Medication Refill    all meds    HPI Randall Reed. is a 46 y.o. male  Here for follow up and medication refills.   Anxiety with insomnia See prior notes. He takes xanax as well as [place], without difficulties for some time. He's had intolerance to multiple SSRI's. As of May visit, he was taking xanax 1-2 tablets a day, and was trying to alternate between [place]. He has a history of migraine headaches with decreased sleep, which improves with ambien.   His last prescription of xanax was on Nov 19th for #60. He takes xanax '1mg'$ . His last prescription of Lorrin Mais was on Nov 19th for #30.   He mentions Dr. Toy Care added metformin '500mg'$  4 tablets a day for prediabetes, basing it on his fasting glucose at 107 and A1C at 5.2 in Sept. He's been having diarrhea and loose stools.   He notes taking ambien every night, and xanax earlier during the day. Sometimes, up to 2 tablets of xanax a day, and sometimes he doesn't need it at all.    Asthma He takes Advair PRN, singulair, and albuterol. He was only taking Advair 1 puff QD at last visit. He's on Advair 250-50 mcg/dose. He has also used Atrovent nasal spray in the past.   He sometimes goes up to Advair 1 puff BID.   Allergies He's treated with allegra and flonase in the past.   Migraine headaches Planned for follow up with headache specialist in [place], where he spends most of his time. He was taking maxalt for treatment previously, 3 maxalt in prior month at last visit. He hasn't met with a headache  specialist yet. He has auras.    GERD and IBS  He was evaluated by Dr. [name] earlier this year. He takes Bentyl BID for IBS symptoms and on nexium BID. He denies any other changes from GI. He mentions having more reflux recently.    HTN He was seen in Dec by [name] with [facility]. His amlodipine was increased to '10mg'$  at that visit. He was having some exertional chest pain at that time, but normal stress testing. Dec 26th wit EF 55-60% and no ischemia; excellent exercise tolerance. He also takes chlorthalidone '25mg'$  Qd and Losartan '100mg'$  QD.    HIV pre exposure prophylaxis  He takes Truvada 200-'300mg'$  1 tablet QD. His last STI testing in May was non reactive HIV, negative GC/CT testing and non reactive RPR. He's still on Truvada, and denies any known exposure. He uses condoms every time.    Erectile dysfunction He takes Cialis as needed. He's tried Viagra in the past, but with its side effects, his cardiologist didn't feel comfortable, so was switched to Cialis.   Patient Active Problem List   Diagnosis Date Noted  . Irritable bowel syndrome 03/14/2016  . Asthma 11/19/2015  . 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 rotator cuff tear 10/17/2013  . Hypogonadism male 09/04/2013  . ED (erectile dysfunction) 07/30/2013  .  Palpitations 02/21/2012  . BMI 33.0-33.9,adult 02/04/2012  . Allergic rhinitis 02/04/2012  . GERD (gastroesophageal reflux disease) 02/04/2012  . Migraine 02/04/2012  . GAD (generalized anxiety disorder) 02/04/2012  . Insomnia 02/04/2012  . HYPERTENSION, BENIGN 04/13/2009  . CHEST PAIN-UNSPECIFIED 04/13/2009   Past Medical History:  Diagnosis Date  . Anxiety   . Arthritis   . Asthma   . Depression   . GERD (gastroesophageal reflux disease)   . Seasonal allergic reaction    Past Surgical History:  Procedure Laterality Date  . none    . none     Allergies  Allergen Reactions  . Desloratadine Other (See Comments)     CLARINEX-"severe headache"  . Hydrocod Polst-Cpm Polst Er Itching  . Loratadine Other (See Comments)    "severe headache"  . Tussionex Pennkinetic Er [Hydrocod Polst-Cpm Polst Er] Itching  . Levbid [Hyoscyamine Sulfate] Rash  . Telithromycin Rash   Prior to Admission medications   Medication Sig Start Date End Date Taking? Authorizing Provider  acetic acid-hydrocortisone (VOSOL-HC) otic solution Place 3 drops into both ears 3 (three) times daily. Use as needed to prevent swimmer's ear 01/10/15  Yes Leandrew Koyanagi, MD  ADVAIR DISKUS 250-50 MCG/DOSE AEPB INHALE 1 PUFF INTO THE LUNGS TWICE DAILY AS NEEDED FOR ALLERGIES OR WHEEZING OR SHORTNESS OF BREATH 09/23/17  Yes [name], MD  albuterol (PROVENTIL HFA;VENTOLIN HFA) 108 (90 Base) MCG/ACT inhaler Inhale 2 puffs into the lungs every 6 (six) hours as needed for wheezing or shortness of breath. 04/20/16  Yes Leandrew Koyanagi, MD  ALPRAZolam [name]) 1 MG tablet TAKE 1 TABLET BY MOUTH TWICE DAILY AS NEEDED 09/23/17  Yes [name], MD  amLODipine (NORVASC) 10 MG tablet Take 1 tablet (10 mg total) by mouth daily. 10/05/16  Yes Sherren Mocha, MD  aspirin EC 81 MG tablet Take 1 tablet (81 mg total) by mouth daily. 03/07/15  Yes Sherren Mocha, MD  CIALIS 20 MG tablet TAKE 1 TABLET BY MOUTH DAILY AS NEEDED 08/28/17  Yes Sherren Mocha, MD  cyclobenzaprine (FLEXERIL) 10 MG tablet Take 1 tablet (10 mg total) by mouth 3 (three) times daily as needed for muscle spasms. 04/20/16  Yes Leandrew Koyanagi, MD  dicyclomine (BENTYL) 20 MG tablet TAKE 1 TABLET BY MOUTH EVERY 6 HOURS AS NEEDED 08/27/17  Yes [name], MD  emtricitabine-tenofovir (TRUVADA) 200-300 MG tablet Take 1 tablet by mouth daily. 06/19/17  Yes [name], MD  esomeprazole (NEXIUM) 40 MG capsule Take 1 capsule (40 mg total) by mouth daily before breakfast. 04/25/15  Yes Leandrew Koyanagi, MD  Fexofenadine HCl (ALLEGRA PO) Take 1 tablet by mouth daily.    Yes  [provider]  fluticasone (FLONASE) 50 MCG/ACT nasal spray INSTILL 2 SPRAYS IN EACH NOSTRIL DAILY 05/21/17  Yes [name], MD  ipratropium (ATROVENT HFA) 17 MCG/ACT inhaler INHALE 2 PUFFS INTO THE LUNGS TWICE DAILY OR AS DIRECTED 04/20/16  Yes Leandrew Koyanagi, MD  ipratropium (ATROVENT) 0.06 % nasal spray Place 2 sprays into the nose 2 (two) times daily. 11/29/16  Yes [name], MD  metFORMIN (GLUCOPHAGE-XR) 500 MG 24 hr tablet Take 500 mg by mouth 4 (four) times daily.   Yes [provider]  montelukast (SINGULAIR) 10 MG tablet Take 1 tablet (10 mg total) by mouth at bedtime. 04/30/17  Yes [name], MD  PROCTOZONE-HC 2.5 % rectal cream USE RECTALLY TWICE DAILY 02/06/17  Yes [name], MD  rizatriptan (MAXALT-MLT) 10 MG disintegrating tablet TAKE AS DIRECTED 04/30/17  Yes [name], MD  testosterone cypionate (DEPOTESTOTERONE CYPIONATE) 100 MG/ML injection Inject 200 mg into the muscle every 14 (fourteen) days. For IM use only   Yes [provider]  zolpidem (AMBIEN) 10 MG tablet TAKE 1 TABLET BY MOUTH EVERY DAY AT BEDTIME 09/23/17  Yes [name], MD  chlorthalidone (HYGROTON) 25 MG tablet Take 1 tablet (25 mg total) by mouth daily. 06/10/17 09/08/17  Sherren Mocha, MD  losartan (COZAAR) 100 MG tablet Take 1 tablet (100 mg total) by mouth daily. 05/20/17 08/18/17  Sherren Mocha, MD   Social History   Socioeconomic History  . Marital status: Single    Spouse name: Not on file  . Number of children: 0  . Years of education: Not on file  . Highest education level: Not on file  Social Needs  . Financial resource strain: Not on file  . Food insecurity - worry: Not on file  . Food insecurity - inability: Not on file  . Transportation needs - medical: Not on file  . Transportation needs - non-medical: Not on file  Occupational History  . 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 Smoking  status: Never Smoker  . Smokeless tobacco: Never Used  Substance and Sexual Activity  . Alcohol use: Yes    Alcohol/week: 1.8 oz    Types: 3 Standard drinks or equivalent per week  . Drug use: No  . Sexual activity: Yes  Other Topics Concern  . Not on file  Social History Narrative  . Not on file   Review of Systems  Constitutional: Negative for fatigue and unexpected weight change.  Eyes: Negative for visual disturbance.  Respiratory: Negative for cough, chest tightness and shortness of breath.   Cardiovascular: Negative for chest pain, palpitations and leg swelling.  Gastrointestinal: Negative for abdominal pain and blood in stool.  Neurological: Negative for dizziness, light-headedness and headaches.       Objective:   Physical Exam  Constitutional: He is oriented to person, place, and time. He appears well-developed and well-nourished.  HENT:  Head: Normocephalic and atraumatic.  Right Ear: Tympanic membrane, external ear and ear canal normal.  Left Ear: Tympanic membrane, external ear and ear canal normal.  Nose: No rhinorrhea.  Mouth/Throat: Oropharynx is clear and moist and mucous membranes are normal. No oropharyngeal exudate or posterior oropharyngeal erythema.  Eyes: Conjunctivae and EOM are normal. Pupils are equal, round, and reactive to light.  Neck: Neck supple. No JVD present. Carotid bruit is not present.  Cardiovascular: Normal rate, regular rhythm, normal heart sounds and intact distal pulses.  No murmur heard. Pulmonary/Chest: Effort normal and breath sounds normal. He has no wheezes. He has no rhonchi. He has no rales.  Abdominal: Soft. There is no tenderness.  Musculoskeletal: He exhibits no edema.  Lymphadenopathy:    He has no cervical adenopathy.  Neurological: He is alert and oriented to person, place, and time.  Skin: Skin is warm and dry. No rash noted.  Psychiatric: He has a normal mood and affect. His behavior is normal.  Vitals  reviewed.   Vitals:   09/27/17 1355  BP: 118/82  Pulse: (!) 102  Resp: 16  Temp: 98.5 F (36.9 C)  TempSrc: Oral  SpO2: 96%  Weight: 247 lb (112 kg)  Height: 5' 11.26" (1.81 m)      Assessment & Plan:     Randall Apple.  is a 46 y.o. male GAD (generalized anxiety disorder) - Plan: ALPRAZolam (XANAX) 1 MG tablet Generalized anxiety disorder   - Intolerant of SSRIs. Overall stable with intermittent alprazolam dosing. Okay to refill medications until follow-up in approximately 6 months.  Hyperglycemia - Plan: Hemoglobin A1c  -Recheck A1c at his request. Discussed it may be less than 3 months and possible out-of-pocket cost. Would recommend discussing his side effects with metformin with endocrinologist, potentially could take lower dose with prediabetes.  Elevated hemoglobin (HCC) - Plan: CBC  -Repeat CBC at his request, there is some concern regarding elevated hemoglobin and testosterone supplementation, but that is monitored by his endocrinologist.  Essential hypertension - Plan: Comprehensive metabolic panel, tadalafil (CIALIS) 20 MG tablet  -Stable on amlodipine. No changes, labs pending  Routine screening for STI (sexually transmitted infection) - Plan: HIV antibody, GC/Chlamydia Probe Amp, RPR  -STI screening, asymptomatic. Continue Truvada for preexposure HIV prophylaxis. CMP, CBC pending as above  Gastroesophageal reflux disease, esophagitis presence not specified Irritable bowel syndrome with diarrhea - Plan: dicyclomine (BENTYL) 20 MG tablet, esomeprazole (NEXIUM) 40 MG capsule  - Some increased symptoms with metformin. recommend discussing with his endocrinologist, continue twice a day Nexium for GERD, Bentyl as needed.   Psychophysiological insomnia - Plan: zolpidem (AMBIEN) 10 MG tablet  -Overall stable, denies any new parasomnias. Continue same dosing as above  Allergic rhinitis - Plan: fluticasone (FLONASE) 50 MCG/ACT nasal spray, ipratropium (ATROVENT)  0.06 % nasal spray, montelukast (SINGULAIR) 10 MG tablet  -Stable with Singulair and Flonase, Atrovent as needed, no changes  Migraine with aura and without status migrainosus, not intractable - Plan: rizatriptan (MAXALT-MLT) 10 MG disintegrating tablet  -Continue Maxalt as needed. Consider headache specialist if increased frequency of use or worsening symptoms  Uncomplicated asthma, unspecified asthma severity, unspecified whether persistent - Plan: Fluticasone-Salmeterol (ADVAIR DISKUS) 250-50 MCG/DOSE AEPB, albuterol (PROVENTIL HFA;VENTOLIN HFA) 108 (90 Base) MCG/ACT inhaler, montelukast (SINGULAIR) 10 MG tablet  -Stable with Advair daily, albuterol if needed for breakthrough symptoms as well as singular daily.  Recheck 6 months  Meds ordered this encounter  Medications  . zolpidem (AMBIEN) 10 MG tablet    Sig: Take 1 tablet (10 mg total) by mouth at bedtime.    Dispense:  30 tablet    Refill:  2    Ok to fill starting 10/25/17  . ALPRAZolam (XANAX) 1 MG tablet    Sig: Take 1 tablet (1 mg total) by mouth 2 (two) times daily as needed.    Dispense:  60 tablet    Refill:  2    Ok to initially fill 10/25/17  . Fluticasone-Salmeterol (ADVAIR DISKUS) 250-50 MCG/DOSE AEPB    Sig: INHALE 1 PUFF INTO THE LUNGS TWICE DAILY AS NEEDED FOR ALLERGIES OR WHEEZING OR SHORTNESS OF BREATH    Dispense:  60 each    Refill:  5    Needs office visit for future refills.  Marland Kitchen albuterol (PROVENTIL HFA;VENTOLIN HFA) 108 (90 Base) MCG/ACT inhaler    Sig: Inhale 2 puffs into the lungs every 6 (six) hours as needed for wheezing or shortness of breath.    Dispense:  1 Inhaler    Refill:  1  . dicyclomine (BENTYL) 20 MG tablet    Sig: Take 1 tablet (20 mg total) by mouth every 6 (six) hours as needed.    Dispense:  180 tablet    Refill:  2  . emtricitabine-tenofovir (TRUVADA) 200-300 MG tablet    Sig: Take 1 tablet by mouth  daily.    Dispense:  90 tablet    Refill:  2  . esomeprazole (NEXIUM) 40 MG  capsule    Sig: Take 1 capsule (40 mg total) by mouth 2 (two) times daily before a meal.    Dispense:  180 capsule    Refill:  2  . fluticasone (FLONASE) 50 MCG/ACT nasal spray    Sig: INSTILL 2 SPRAYS IN EACH NOSTRIL DAILY    Dispense:  16 g    Refill:  4  . ipratropium (ATROVENT) 0.06 % nasal spray    Sig: Place 2 sprays into the nose 2 (two) times daily.    Dispense:  15 mL    Refill:  9  . montelukast (SINGULAIR) 10 MG tablet    Sig: Take 1 tablet (10 mg total) by mouth at bedtime.    Dispense:  30 tablet    Refill:  6  . rizatriptan (MAXALT-MLT) 10 MG disintegrating tablet    Sig: TAKE AS DIRECTED    Dispense:  9 tablet    Refill:  1  . tadalafil (CIALIS) 20 MG tablet    Sig: Take 1 tablet (20 mg total) by mouth daily as needed.    Dispense:  12 tablet    Refill:  0   Patient Instructions     Talk to Dr. Buddy Duty about lower dose of metformin due to diarrhea/stomach upset.   On change in meds for now. Keep follow up with cardiology.   Return to the clinic or go to the nearest emergency room if any of your symptoms worsen or new symptoms occur.  Plan for follow up in 6 months - sooner if needed. Thanks for coming in today.   IF you received an x-ray today, you will receive an invoice from [facility]. Please contact [facility] at [phone] with questions or concerns regarding your invoice.   IF you received labwork today, you will receive an invoice from [place]. Please contact [facility] at [phone] with questions or concerns regarding your invoice.   Our billing staff will not be able to assist you with questions regarding bills from these companies.  You will be contacted with the lab results as soon as they are available. The fastest way to get your results is to activate your My Chart account. Instructions are located on the last page of this paperwork. If you have not heard from [place] regarding the results in 2 weeks, please contact this  office.       I personally performed the services described in this documentation, which was scribed in my presence. The recorded information has been reviewed and considered for accuracy and completeness, addended by me as needed, and agree with information above.  Signed,   [name], MD [facility] [place].  09/29/17 11:53 AM

## 2017-09-27 NOTE — Patient Instructions (Addendum)
   Talk to Dr. Buddy Duty about lower dose of metformin due to diarrhea/stomach upset.   On change in meds for now. Keep follow up with cardiology.   Return to the clinic or go to the nearest emergency room if any of your symptoms worsen or new symptoms occur.  Plan for follow up in 6 months - sooner if needed. Thanks for coming in today.   IF you received an x-ray today, you will receive an invoice from [facility]. Please contact [facility] at [phone] with questions or concerns regarding your invoice.   IF you received labwork today, you will receive an invoice from [place]. Please contact [facility] at [phone] with questions or concerns regarding your invoice.   Our billing staff will not be able to assist you with questions regarding bills from these companies.  You will be contacted with the lab results as soon as they are available. The fastest way to get your results is to activate your My Chart account. Instructions are located on the last page of this paperwork. If you have not heard from [place] regarding the results in 2 weeks, please contact this office.

## 2017-09-28 LAB — COMPREHENSIVE METABOLIC PANEL
ALT: 18 IU/L (ref 0–44)
AST: 13 IU/L (ref 0–40)
Albumin/Globulin Ratio: 2 (ref 1.2–2.2)
Albumin: 5.1 g/dL (ref 3.5–5.5)
Alkaline Phosphatase: 72 IU/L (ref 39–117)
BUN/Creatinine Ratio: 10 (ref 9–20)
BUN: 10 mg/dL (ref 6–24)
Bilirubin Total: 0.4 mg/dL (ref 0.0–1.2)
CALCIUM: 10.2 mg/dL (ref 8.7–10.2)
CO2: 22 mmol/L (ref 20–29)
CREATININE: 0.96 mg/dL (ref 0.76–1.27)
Chloride: 97 mmol/L (ref 96–106)
GFR, EST AFRICAN AMERICAN: 108 mL/min/{1.73_m2} (ref >59)
GFR, EST NON AFRICAN AMERICAN: 93 mL/min/{1.73_m2} (ref >59)
GLUCOSE: 89 mg/dL (ref 65–99)
Globulin, Total: 2.5 g/dL (ref 1.5–4.5)
POTASSIUM: 3.5 mmol/L (ref 3.5–5.2)
Sodium: 140 mmol/L (ref 134–144)
TOTAL PROTEIN: 7.6 g/dL (ref 6.0–8.5)

## 2017-09-28 LAB — RPR: RPR: NONREACTIVE

## 2017-09-28 LAB — HEMOGLOBIN A1C
Est. average glucose Bld gHb Est-mCnc: 103 mg/dL
Hgb A1c MFr Bld: 5.2 % (ref 4.8–5.6)

## 2017-09-28 LAB — CBC
Hematocrit: 52.7 % — ABNORMAL HIGH (ref 37.5–51.0)
Hemoglobin: 19 g/dL — ABNORMAL HIGH (ref 13.0–17.7)
MCH: 30.4 pg (ref 26.6–33.0)
MCHC: 36.1 g/dL — ABNORMAL HIGH (ref 31.5–35.7)
MCV: 85 fL (ref 79–97)
PLATELETS: 219 10*3/uL (ref 150–379)
RBC: 6.24 x10E6/uL — AB (ref 4.14–5.80)
RDW: 14.5 % (ref 12.3–15.4)
WBC: 11.2 10*3/uL — AB (ref 3.4–10.8)

## 2017-09-28 LAB — HIV ANTIBODY (ROUTINE TESTING W REFLEX): HIV SCREEN 4TH GENERATION: NONREACTIVE

## 2017-10-19 ENCOUNTER — Other Ambulatory Visit: Payer: Self-pay | Admitting: Family Medicine

## 2017-10-19 ENCOUNTER — Other Ambulatory Visit: Payer: Self-pay | Admitting: Cardiovascular Disease

## 2017-10-19 DIAGNOSIS — F5104 Psychophysiologic insomnia: Secondary | ICD-10-CM

## 2017-10-19 DIAGNOSIS — F411 Generalized anxiety disorder: Secondary | ICD-10-CM

## 2017-10-19 DIAGNOSIS — I208 Other forms of angina pectoris: Secondary | ICD-10-CM

## 2017-10-19 DIAGNOSIS — I10 Essential (primary) hypertension: Secondary | ICD-10-CM

## 2017-10-21 ENCOUNTER — Telehealth: Payer: Self-pay | Admitting: Family Medicine

## 2017-10-21 NOTE — Telephone Encounter (Signed)
Pt. Wants to be sure doctor resends his medications- Xanax and Ambien on 10/25/17 to the [facility] in [place]

## 2017-10-21 NOTE — Telephone Encounter (Signed)
Copied from [place] #[id number]. Topic: Quick Communication - See Telephone Encounter >> Oct 21, 2017 10:08 AM Hewitt Shorts wrote: CRM for notification. See Telephone encounter for: pt is wanting to know why the zanax and Lorrin Mais has been denied for a refill request  They both have 3 refills and that there should be notes in chart that pt did not need to be seen   Best number [phone]   10/21/17.

## 2017-11-12 ENCOUNTER — Other Ambulatory Visit: Payer: Self-pay | Admitting: Family Medicine

## 2017-11-12 DIAGNOSIS — J45909 Unspecified asthma, uncomplicated: Secondary | ICD-10-CM

## 2017-11-18 ENCOUNTER — Other Ambulatory Visit: Payer: Self-pay | Admitting: Family Medicine

## 2017-11-18 DIAGNOSIS — G43109 Migraine with aura, not intractable, without status migrainosus: Secondary | ICD-10-CM

## 2017-11-18 NOTE — Telephone Encounter (Signed)
Medication request.

## 2017-11-21 ENCOUNTER — Other Ambulatory Visit: Payer: Self-pay | Admitting: Cardiovascular Disease

## 2017-11-21 DIAGNOSIS — I208 Other forms of angina pectoris: Secondary | ICD-10-CM

## 2017-11-21 DIAGNOSIS — I10 Essential (primary) hypertension: Secondary | ICD-10-CM

## 2017-11-21 DIAGNOSIS — R5383 Other fatigue: Secondary | ICD-10-CM | POA: Diagnosis not present

## 2017-11-29 ENCOUNTER — Other Ambulatory Visit: Payer: Self-pay | Admitting: Cardiovascular Disease

## 2017-11-29 ENCOUNTER — Telehealth: Payer: Self-pay | Admitting: Cardiovascular Disease

## 2017-11-29 DIAGNOSIS — I208 Other forms of angina pectoris: Secondary | ICD-10-CM

## 2017-11-29 DIAGNOSIS — I10 Essential (primary) hypertension: Secondary | ICD-10-CM

## 2017-11-29 NOTE — Telephone Encounter (Signed)
Closed encounter °

## 2017-12-27 ENCOUNTER — Ambulatory Visit (INDEPENDENT_AMBULATORY_CARE_PROVIDER_SITE_OTHER): Payer: BLUE CROSS/BLUE SHIELD

## 2017-12-27 ENCOUNTER — Ambulatory Visit: Payer: BLUE CROSS/BLUE SHIELD | Admitting: Physician Assistant

## 2017-12-27 ENCOUNTER — Encounter: Payer: Self-pay | Admitting: Physician Assistant

## 2017-12-27 VITALS — BP 122/72 | HR 104 | Temp 98.4°F | Resp 17 | Ht 71.5 in | Wt 257.0 lb

## 2017-12-27 DIAGNOSIS — R1314 Dysphagia, pharyngoesophageal phase: Secondary | ICD-10-CM | POA: Diagnosis not present

## 2017-12-27 DIAGNOSIS — R1310 Dysphagia, unspecified: Secondary | ICD-10-CM | POA: Diagnosis not present

## 2017-12-27 NOTE — Patient Instructions (Addendum)
  Let us take a watchful waiting approach.  Go ahead and schedule an appointment for the 15th to see myself or Dr. Nyoka Cowden.  If you are having more immediate problems and is best to call your gastroenterologist to see if they can work nightly for another scope.   IF you received an x-ray today, you will receive an invoice from [facility]. Please contact [facility] at [phone] with questions or concerns regarding your invoice.   IF you received labwork today, you will receive an invoice from [place]. Please contact [facility] at [phone] with questions or concerns regarding your invoice.   Our billing staff will not be able to assist you with questions regarding bills from these companies.  You will be contacted with the lab results as soon as they are available. The fastest way to get your results is to activate your My Chart account. Instructions are located on the last page of this paperwork. If you have not heard from [place] regarding the results in 2 weeks, please contact this office.

## 2017-12-27 NOTE — Progress Notes (Signed)
12/27/2017 12:19 PM   DOB: [birth year] / MRN: [id number]  SUBJECTIVE:  Randall Reed. is a 47 y.o. male presenting for dyphagia.  This started this morning.  He tells me he had some difficulty with swallowing a small piece of sausage initially this morning.  He was able to drink some coffee and some other fluids and then was able to take his medication.  Of note patient has a long history of GERD and has had multiple normal upper endoscopy procedures.  He has no known history of Barrett's.  He does take Nexium 40 mg twice daily.  Records show he has gained weight since his last encounter.  He denies painful swallowing at this time.  He is a never smoker.  He did recently send himDr. Carlota Raspberry to GI and the patient tells me that the gastroenterologist talked with him, really wrote him for PPI and advised routine follow-up and did not advise a scope.  He is allergic to desloratadine; hydrocod polst-cpm polst er; loratadine; tussionex pennkinetic er [hydrocod polst-cpm polst er]; levbid [hyoscyamine sulfate]; and telithromycin.   He  has a past medical history of Anxiety, Arthritis, Asthma, Depression, GERD (gastroesophageal reflux disease), and Seasonal allergic reaction.    He  reports that  has never smoked. he has never used smokeless tobacco. He reports that he drinks about 1.8 oz of alcohol per week. He reports that he does not use drugs. He  reports that he currently engages in sexual activity. The patient  has a past surgical history that includes none and none.  His family history includes Breast cancer in his unknown relative; Cancer in his unknown relative; Coronary artery disease in his paternal grandfather; Coronary artery disease (age of onset: 40) in his father; Heart attack in his father, maternal grandfather, and unknown relative; Hyperlipidemia in his unknown relative; Hypertension in his unknown relative.  Review of Systems  Constitutional: Negative for chills, diaphoresis and  fever.  Respiratory: Negative for cough, hemoptysis, sputum production, shortness of breath and wheezing.   Cardiovascular: Negative for chest pain, orthopnea and leg swelling.  Gastrointestinal: Negative for abdominal pain, blood in stool, constipation, diarrhea, heartburn, melena, nausea and vomiting.  Genitourinary: Negative for flank pain.  Skin: Negative for rash.  Neurological: Negative for dizziness.    The problem list and medications were reviewed and updated by myself where necessary and exist elsewhere in the encounter.   OBJECTIVE:  BP 122/72   Pulse (!) 104   Temp 98.4 F (36.9 C) (Oral)   Resp 17   Ht 5' 11.5" (1.816 m)   Wt 257 lb (116.6 kg)   SpO2 98%   BMI 35.34 kg/m   Wt Readings from Last 3 Encounters:  12/27/17 257 lb (116.6 kg)  09/27/17 247 lb (112 kg)  03/07/17 255 lb 12.8 oz (116 kg)     Physical Exam  Constitutional: He is oriented to person, place, and time. He appears well-developed. He is active and cooperative.  Non-toxic appearance.  HENT:  Mouth/Throat: Oropharynx is clear and moist. No oropharyngeal exudate.  Eyes: EOM are normal. Pupils are equal, round, and reactive to light.  Neck: Normal range of motion. Neck supple. No JVD present. No tracheal deviation present. No thyromegaly present.  I have directly observed him drinking water without difficulty.  Cardiovascular: Normal rate, regular rhythm, S1 normal, S2 normal, normal heart sounds, intact distal pulses and normal pulses. Exam reveals no gallop and no friction rub.  No murmur heard. Pulmonary/Chest:  Effort normal. No stridor. No tachypnea. No respiratory distress. He has no wheezes. He has no rales.  Abdominal: He exhibits no distension.  Musculoskeletal: He exhibits no edema.  Lymphadenopathy:       Head (right side): No submental, no submandibular and no tonsillar adenopathy present.       Head (left side): No submental, no submandibular and no tonsillar adenopathy present.     He has no cervical adenopathy.  Neurological: He is alert and oriented to person, place, and time. He has normal strength and normal reflexes. He is not disoriented. No cranial nerve deficit or sensory deficit. He exhibits normal muscle tone. Coordination and gait normal.  Skin: Skin is warm and dry. He is not diaphoretic. No pallor.  Psychiatric: His behavior is normal.  Vitals reviewed.   BP Readings from Last 3 Encounters:  12/27/17 122/72  09/27/17 118/82  03/07/17 135/82   Pulse Readings from Last 3 Encounters:  12/27/17 (!) 104  09/27/17 (!) 102  03/07/17 (!) 116     No results found for this or any previous visit (from the past 72 hour(s)).  Dg Neck Soft Tissue  Result Date: 12/27/2017 CLINICAL DATA:  Dysphagia. EXAM: NECK SOFT TISSUES - 1+ VIEW COMPARISON:  None. FINDINGS: There is no evidence of retropharyngeal soft tissue swelling or epiglottic enlargement. The cervical airway is unremarkable and no radio-opaque foreign body identified. IMPRESSION: Negative. Electronically Signed   By: Marijo Conception, M.D.   On: 12/27/2017 12:12    ASSESSMENT AND PLAN:  Randall Reed was seen today for dysphagia.  Diagnoses and all orders for this visit:  Dysphagia, pharyngoesophageal phase: Despite his history of GERD his exam and his ability to move fluids at this time are reassuring.  He is not taking any new medications at this time.  He does have a history of anxiety and I do wonder if this may be contributing.  I have instructed him to have a low threshold to contact his GI doctor.  He will come back on the 15th to see myself or Dr. Nyoka Cowden for follow-up of this problem. -     DG Neck Soft Tissue; Future -     TSH -     CBC    The patient is advised to call or return to clinic if he does not see an improvement in symptoms, or to seek the care of the closest emergency department if he worsens with the above plan.   Philis Fendt, MHS, PA-C [facility] 12/27/2017 12:19 PM

## 2017-12-28 LAB — CBC
HEMATOCRIT: 52 % — AB (ref 37.5–51.0)
Hemoglobin: 18.2 g/dL — ABNORMAL HIGH (ref 13.0–17.7)
MCH: 30.7 pg (ref 26.6–33.0)
MCHC: 35 g/dL (ref 31.5–35.7)
MCV: 88 fL (ref 79–97)
Platelets: 231 10*3/uL (ref 150–379)
RBC: 5.93 x10E6/uL — ABNORMAL HIGH (ref 4.14–5.80)
RDW: 14.6 % (ref 12.3–15.4)
WBC: 9 10*3/uL (ref 3.4–10.8)

## 2017-12-28 LAB — TSH: TSH: 1.25 u[IU]/mL (ref 0.450–4.500)

## 2017-12-31 ENCOUNTER — Other Ambulatory Visit: Payer: Self-pay | Admitting: Family Medicine

## 2017-12-31 DIAGNOSIS — K644 Residual hemorrhoidal skin tags: Secondary | ICD-10-CM

## 2017-12-31 DIAGNOSIS — G43109 Migraine with aura, not intractable, without status migrainosus: Secondary | ICD-10-CM

## 2018-01-02 NOTE — Telephone Encounter (Signed)
LOV 12/27/17 PCP Dr. Carlota Raspberry

## 2018-01-15 ENCOUNTER — Other Ambulatory Visit: Payer: Self-pay | Admitting: Family Medicine

## 2018-01-15 DIAGNOSIS — G43109 Migraine with aura, not intractable, without status migrainosus: Secondary | ICD-10-CM

## 2018-01-15 DIAGNOSIS — F411 Generalized anxiety disorder: Secondary | ICD-10-CM

## 2018-01-15 DIAGNOSIS — F5104 Psychophysiologic insomnia: Secondary | ICD-10-CM

## 2018-01-16 NOTE — Telephone Encounter (Signed)
Refilled Ambien and alprazolam, both with 1 refill to allow him to follow-up in May as that will be 6 months from last visit. Rizatriptan also refilled until that visit. Let me know if there are questions

## 2018-01-16 NOTE — Telephone Encounter (Signed)
Please advise the refill of zolpidem,alprazolam ,and ritatriptan.

## 2018-01-16 NOTE — Telephone Encounter (Signed)
Zolpidem refill Last OV: 09/27/17 Last Refill:09/27/17 #30 2 RF Pharmacy:[facility] [address] PCP: Dr [name]  alprazolam refill Last OV: 09/27/17 Last Refill:09/27/17 Pharmacy:[facility] [address] PCP: Dr [name]  Rizatriptan refill Last OV: 09/27/17 Last Refill:01/05/18 Pharmacy:[facility] [address]

## 2018-01-17 ENCOUNTER — Ambulatory Visit: Payer: BLUE CROSS/BLUE SHIELD | Admitting: Cardiovascular Disease

## 2018-01-17 ENCOUNTER — Encounter: Payer: Self-pay | Admitting: Cardiovascular Disease

## 2018-01-17 VITALS — BP 100/76 | HR 100 | Ht 71.5 in | Wt 260.8 lb

## 2018-01-17 DIAGNOSIS — E230 Hypopituitarism: Secondary | ICD-10-CM | POA: Diagnosis not present

## 2018-01-17 DIAGNOSIS — Z5181 Encounter for therapeutic drug level monitoring: Secondary | ICD-10-CM | POA: Diagnosis not present

## 2018-01-17 DIAGNOSIS — I208 Other forms of angina pectoris: Secondary | ICD-10-CM | POA: Diagnosis not present

## 2018-01-17 DIAGNOSIS — Z125 Encounter for screening for malignant neoplasm of prostate: Secondary | ICD-10-CM | POA: Diagnosis not present

## 2018-01-17 DIAGNOSIS — R079 Chest pain, unspecified: Secondary | ICD-10-CM | POA: Diagnosis not present

## 2018-01-17 DIAGNOSIS — R0600 Dyspnea, unspecified: Secondary | ICD-10-CM | POA: Diagnosis not present

## 2018-01-17 DIAGNOSIS — E669 Obesity, unspecified: Secondary | ICD-10-CM | POA: Diagnosis not present

## 2018-01-17 DIAGNOSIS — I10 Essential (primary) hypertension: Secondary | ICD-10-CM

## 2018-01-17 DIAGNOSIS — Z79899 Other long term (current) drug therapy: Secondary | ICD-10-CM | POA: Diagnosis not present

## 2018-01-17 MED ORDER — METOPROLOL TARTRATE 50 MG PO TABS: 50.0000 mg | ORAL_TABLET | Freq: Once | ORAL | 0 refills | Status: DC

## 2018-01-17 MED ORDER — AMLODIPINE BESYLATE 10 MG PO TABS: 10.0000 mg | ORAL_TABLET | Freq: Every day | ORAL | 3 refills | Status: DC

## 2018-01-17 NOTE — Patient Instructions (Addendum)
Medication Instructions:  Your provider recommends that you continue on your current medications as directed. Please refer to the Current Medication list given to you today.    Labwork: None  Testing/Procedures: [name] recommends you have a CARDIAC CT.  Follow-Up: Your provider wants you to follow-up in: 1 year with [name]. You will receive a reminder letter in the mail two months in advance. If you don't receive a letter, please call our office to schedule the follow-up appointment.    Any Other Special Instructions Will Be Listed Below (If Applicable). CARDIAC CT INSTRUCTIONS: Please arrive at the [facility] main entrance of [facility] at xx:xx AM (30-45 minutes prior to test start time). You will be called with time and date.  [facility] [address] [place], [place] [address] [phone]  Proceed to the [facility] Radiology Department (First Floor).  Please follow these instructions carefully (unless otherwise directed):  Hold all erectile dysfunction medications at least 48 hours prior to test.  On the Night Before the Test: . Drink plenty of water. . Do not consume any caffeinated/decaffeinated beverages or chocolate 12 hours prior to your test. . Do not take any antihistamines 12 hours prior to your test. . If you take Metformin do not take 24 hours prior to test.   On the Day of the Test: . Drink plenty of water. Do not drink any water within one hour of the test. . Do not eat any food 4 hours prior to the test. . You may take your regular medications prior to the test. . Take 50 mg of lopressor (metoprolol) one hour before the test. (This has been called in for you) . HOLD CHLORTHALIDONE until after your test.  After the Test: . Drink plenty of water. . After receiving IV contrast, you may experience a mild flushed feeling. This is normal. . On occasion, you may experience a mild rash up to 24 hours after the test. This is not  dangerous. If this occurs, you can take Benadryl 25 mg and increase your fluid intake. . If you experience trouble breathing, this can be serious. If it is severe call 911 IMMEDIATELY. If it is mild, please call our office. . If you take any of these medications: Glipizide/Metformin, Avandament, Glucavance, please do not take 48 hours after completing test.   If you need a refill on your cardiac medications before your next appointment, please call your pharmacy.

## 2018-01-17 NOTE — Progress Notes (Signed)
Cardiology Office Note Date:  01/19/2018   ID:  Randall Apple., DOB Jul 23, 1971, MRN [id number]  PCP:  [name], MD  Cardiologist:  Sherren Mocha, MD    Chief Complaint  Patient presents with  . Shortness of Breath     History of Present Illness: Randall Packman. is a 47 y.o. male who presents for follow-up evaluation. He has type 2 diabetes and hypertension.  He had a coronary CT angiogram 6 years ago demonstrating no obstructive CAD.  The patient is here alone today.  He has been having progressive exertional dyspnea and intermittent chest pain.  He describes this as a pressure-like sensation on the left side, sometimes associated with exertion.  Episodes have been self-limited.  He denies orthopnea or PND.  He had an episode while he was doing yoga where he became short of breath and felt very weak all over.  He did not have frank syncope.   Past Medical History:  Diagnosis Date  . Anxiety   . Arthritis   . Asthma   . Depression   . GERD (gastroesophageal reflux disease)   . Seasonal allergic reaction     Past Surgical History:  Procedure Laterality Date  . none    . none      Current Outpatient Medications  Medication Sig Dispense Refill  . acetic acid-hydrocortisone (VOSOL-HC) otic solution Place 3 drops into both ears 3 (three) times daily. Use as needed to prevent swimmer's ear 10 mL 5  . ALPRAZolam (XANAX) 1 MG tablet TAKE 1 TABLET BY MOUTH TWICE DAILY AS NEEDED 60 tablet 1  . amLODipine (NORVASC) 10 MG tablet Take 1 tablet (10 mg total) by mouth daily. Please keep upcoming appt for future refills. Thank you 90 tablet 3  . aspirin EC 81 MG tablet Take 1 tablet (81 mg total) by mouth daily.    . chlorthalidone (HYGROTON) 25 MG tablet Take 25 mg by mouth daily.    . cyclobenzaprine (FLEXERIL) 10 MG tablet Take 1 tablet (10 mg total) by mouth 3 (three) times daily as needed for muscle spasms. 30 tablet 11  . dicyclomine (BENTYL) 20 MG tablet Take 1  tablet (20 mg total) by mouth every 6 (six) hours as needed. 180 tablet 2  . emtricitabine-tenofovir (TRUVADA) 200-300 MG tablet Take 1 tablet by mouth daily. 90 tablet 2  . esomeprazole (NEXIUM) 40 MG capsule Take 1 capsule (40 mg total) by mouth 2 (two) times daily before a meal. 180 capsule 2  . Fexofenadine HCl (ALLEGRA PO) Take 1 tablet by mouth daily.     . fluticasone (FLONASE) 50 MCG/ACT nasal spray INSTILL 2 SPRAYS IN EACH NOSTRIL DAILY 16 g 4  . Fluticasone-Salmeterol (ADVAIR DISKUS) 250-50 MCG/DOSE AEPB INHALE 1 PUFF INTO THE LUNGS TWICE DAILY AS NEEDED FOR ALLERGIES OR WHEEZING OR SHORTNESS OF BREATH 60 each 5  . ipratropium (ATROVENT HFA) 17 MCG/ACT inhaler INHALE 2 PUFFS INTO THE LUNGS TWICE DAILY OR AS DIRECTED 12.9 g 11  . ipratropium (ATROVENT) 0.06 % nasal spray Place 2 sprays into the nose 2 (two) times daily. 15 mL 9  . losartan (COZAAR) 100 MG tablet Take 100 mg by mouth daily.    . metFORMIN (GLUCOPHAGE-XR) 500 MG 24 hr tablet Take 500 mg by mouth 4 (four) times daily.    . montelukast (SINGULAIR) 10 MG tablet Take 1 tablet (10 mg total) by mouth at bedtime. 30 tablet 6  . PROAIR HFA 108 (90 Base) MCG/ACT  inhaler INHALE 2 PUFFS INTO THE LUNGS EVERY 6 HOURS AS NEEDED FOR WHEEZING OR SHORTNESS OF BREATH 8.5 g 0  . PROCTOZONE-HC 2.5 % rectal cream USE RECTALLY TWICE DAILY 30 g 0  . rizatriptan (MAXALT-MLT) 10 MG disintegrating tablet TAKE ONE TABLET BY MOUTH AS DIRECTED 9 tablet 1  . tadalafil (CIALIS) 20 MG tablet Take 1 tablet (20 mg total) by mouth daily as needed. 12 tablet 0  . testosterone cypionate (DEPOTESTOTERONE CYPIONATE) 100 MG/ML injection Inject 200 mg into the muscle every 14 (fourteen) days. For IM use only    . Vitamin D, Ergocalciferol, (DRISDOL) 50000 units CAPS capsule Take 50,000 Units by mouth every 7 (seven) days.    Marland Kitchen zolpidem (AMBIEN) 10 MG tablet TAKE 1 TABLET BY MOUTH AT BEDTIME 30 tablet 1  . metoprolol tartrate (LOPRESSOR) 50 MG tablet Take 1 tablet  (50 mg total) by mouth once for 1 dose. Take 1 hour prior to your CT scan. 1 tablet 0   No current facility-administered medications for this visit.     Allergies:   Desloratadine; Hydrocod polst-cpm polst er; Loratadine; Tussionex pennkinetic er [employer] er]; Levbid [hyoscyamine sulfate]; and Telithromycin   Social History:  The patient  reports that  has never smoked. he has never used smokeless tobacco. He reports that he drinks about 1.8 oz of alcohol per week. He reports that he does not use drugs.   Family History:  The patient's  family history includes Breast cancer in his unknown relative; Cancer in his unknown relative; Coronary artery disease in his paternal grandfather; Coronary artery disease (age of onset: 18) in his father; Heart attack in his father, maternal grandfather, and unknown relative; Hyperlipidemia in his unknown relative; Hypertension in his unknown relative.    ROS:  Please see the history of present illness.  Otherwise, review of systems is positive for weight gain, chest pain, DOE, excessive fatigue, headaches.  All other systems are reviewed and negative.    PHYSICAL EXAM: VS:  BP 100/76   Pulse 100   Ht 5' 11.5" (1.816 m)   Wt 260 lb 12.8 oz (118.3 kg)   BMI 35.87 kg/m  , BMI Body mass index is 35.87 kg/m. GEN: pleasant overweight male, in no acute distress  HEENT: normal  Neck: no JVD, no masses. No carotid bruits Cardiac: RRR without murmur or gallop     Respiratory:  clear to auscultation bilaterally, normal work of breathing GI: soft, nontender, nondistended, + BS MS: no deformity or atrophy  Ext: no pretibial edema, pedal pulses 2+= bilaterally Skin: warm and dry, no rash Neuro:  Strength and sensation are intact Psych: euthymic mood, full affect  EKG:  EKG is ordered today. The ekg ordered today shows NSR 100 bpm, within normal limits  Recent Labs: 09/27/2017: ALT 18; BUN 10; Creatinine, Ser 0.96; Potassium 3.5; Sodium  140 12/27/2017: Hemoglobin 18.2; Platelets 231; TSH 1.250   Lipid Panel     Component Value Date/Time   CHOL 156 11/18/2015 1657   TRIG 176 (H) 11/18/2015 1657   HDL 31 (L) 11/18/2015 1657   CHOLHDL 5.0 11/18/2015 1657   VLDL 35 (H) 11/18/2015 1657   LDLCALC 90 11/18/2015 1657      Wt Readings from Last 3 Encounters:  01/17/18 260 lb 12.8 oz (118.3 kg)  12/27/17 257 lb (116.6 kg)  09/27/17 247 lb (112 kg)     Cardiac Studies Reviewed: Exercise stress echo 10-30-2016: Study Conclusions  - Baseline ECG: Normal sinus  rhythm. - Stress ECG conclusions: Sinus tachycardia without ischemic EKG   changes. - Baseline: LVEF 55-60%, normal wall motion and thickening. - Peak stress: Expected hyperdynamic increase in LVEF to 75-80%,   with normal wall motion and thickening. - Recovery: LVEF 60%, normal wall motion and thickening.  Impressions:  - Electrically and echocardiographically normal stress   echocardiogram without evidence for ischemia at given workload.   Excellent exercise tolerance without chest pain.  ASSESSMENT AND PLAN: 1.  Shortness of breath: Patient has symptoms of exertional dyspnea, diaphoresis, weakness, and chest discomfort.  I have recommended further evaluation with a gated coronary CTA-FFR study.  He does have significant risk for premature CAD and has a very strong family history of coronary artery disease with sudden cardiac death in his father in his 55s and in a paternal aunt also in her mid 40s.  2. HTN: Blood pressure is well controlled on his current medical program.  3.  Type 2 diabetes: Treated with metformin.  Hemoglobin A1c 5.2.  4.  Obesity with BMI greater than 35 in the setting of type 2 diabetes and hypertension.  Counseling done regarding lifestyle modification, diet, and exercise.  Current medicines are reviewed with the patient today.  The patient does not have concerns regarding medicines.  Labs/ tests ordered today include:   Orders  Placed This Encounter  Procedures  . CT CORONARY MORPH W/CTA COR W/SCORE W/CA W/CM &/OR WO/CM  . CT CORONARY FRACTIONAL FLOW RESERVE DATA PREP  . CT CORONARY FRACTIONAL FLOW RESERVE FLUID ANALYSIS  . EKG 12-Lead    Disposition:   FU one year  Signed, Sherren Mocha, MD  01/19/2018 11:08 AM    [facility] [place], [place], [place]  [address] Phone: [phone]; Fax: [phone]

## 2018-01-19 ENCOUNTER — Encounter: Payer: Self-pay | Admitting: Cardiovascular Disease

## 2018-01-21 ENCOUNTER — Other Ambulatory Visit: Payer: Self-pay | Admitting: Cardiovascular Disease

## 2018-01-23 DIAGNOSIS — E230 Hypopituitarism: Secondary | ICD-10-CM | POA: Diagnosis not present

## 2018-02-03 ENCOUNTER — Encounter: Payer: Self-pay | Admitting: Family Medicine

## 2018-02-07 DIAGNOSIS — N6322 Unspecified lump in the left breast, upper inner quadrant: Secondary | ICD-10-CM | POA: Diagnosis not present

## 2018-02-07 DIAGNOSIS — D171 Benign lipomatous neoplasm of skin and subcutaneous tissue of trunk: Secondary | ICD-10-CM | POA: Diagnosis not present

## 2018-02-07 DIAGNOSIS — E230 Hypopituitarism: Secondary | ICD-10-CM | POA: Diagnosis not present

## 2018-02-07 DIAGNOSIS — N6489 Other specified disorders of breast: Secondary | ICD-10-CM | POA: Diagnosis not present

## 2018-02-07 DIAGNOSIS — D1739 Benign lipomatous neoplasm of skin and subcutaneous tissue of other sites: Secondary | ICD-10-CM | POA: Diagnosis not present

## 2018-02-07 DIAGNOSIS — N644 Mastodynia: Secondary | ICD-10-CM | POA: Diagnosis not present

## 2018-02-07 DIAGNOSIS — N6311 Unspecified lump in the right breast, upper outer quadrant: Secondary | ICD-10-CM | POA: Diagnosis not present

## 2018-02-07 DIAGNOSIS — R928 Other abnormal and inconclusive findings on diagnostic imaging of breast: Secondary | ICD-10-CM | POA: Diagnosis not present

## 2018-02-13 ENCOUNTER — Other Ambulatory Visit: Payer: Self-pay | Admitting: Gastroenterology

## 2018-02-13 DIAGNOSIS — K580 Irritable bowel syndrome with diarrhea: Secondary | ICD-10-CM | POA: Diagnosis not present

## 2018-02-13 DIAGNOSIS — R109 Unspecified abdominal pain: Secondary | ICD-10-CM

## 2018-02-13 DIAGNOSIS — K219 Gastro-esophageal reflux disease without esophagitis: Secondary | ICD-10-CM | POA: Diagnosis not present

## 2018-02-13 DIAGNOSIS — R140 Abdominal distension (gaseous): Secondary | ICD-10-CM | POA: Diagnosis not present

## 2018-02-18 ENCOUNTER — Other Ambulatory Visit: Payer: Self-pay | Admitting: Cardiovascular Disease

## 2018-02-20 ENCOUNTER — Other Ambulatory Visit: Payer: Self-pay

## 2018-02-21 ENCOUNTER — Other Ambulatory Visit: Payer: BLUE CROSS/BLUE SHIELD

## 2018-03-03 DIAGNOSIS — K760 Fatty (change of) liver, not elsewhere classified: Secondary | ICD-10-CM | POA: Diagnosis not present

## 2018-03-03 DIAGNOSIS — K5732 Diverticulitis of large intestine without perforation or abscess without bleeding: Secondary | ICD-10-CM | POA: Diagnosis not present

## 2018-03-12 ENCOUNTER — Other Ambulatory Visit: Payer: Self-pay | Admitting: Gastroenterology

## 2018-03-12 DIAGNOSIS — K769 Liver disease, unspecified: Secondary | ICD-10-CM

## 2018-03-13 ENCOUNTER — Other Ambulatory Visit: Payer: Self-pay | Admitting: Family Medicine

## 2018-03-13 DIAGNOSIS — F5104 Psychophysiologic insomnia: Secondary | ICD-10-CM

## 2018-03-13 DIAGNOSIS — F411 Generalized anxiety disorder: Secondary | ICD-10-CM

## 2018-03-14 ENCOUNTER — Encounter: Payer: Self-pay | Admitting: Family Medicine

## 2018-03-14 NOTE — Telephone Encounter (Signed)
Refilled.  Should be seen prior to next refill.

## 2018-03-14 NOTE — Telephone Encounter (Signed)
Patient is requesting a refill of the following medications: Requested Prescriptions   Pending Prescriptions Disp Refills  . ALPRAZolam (XANAX) 1 MG tablet [Pharmacy Med Name: ALPRAZOLAM 1MG  TABLETS] 60 tablet 0    Sig: TAKE 1 TABLET BY MOUTH TWICE DAILY AS NEEDED  . zolpidem (AMBIEN) 10 MG tablet [Pharmacy Med Name: ZOLPIDEM 10MG  TABLETS] 30 tablet 0    Sig: TAKE 1 TABLET BY MOUTH AT BEDTIME    Date of patient request:03/14/2018 Last office visit: 12/27/2017 Date of last refill: 01/16/2018 Last refill amount: 60, 30 Follow up time period per chart:

## 2018-03-15 ENCOUNTER — Other Ambulatory Visit: Payer: BLUE CROSS/BLUE SHIELD

## 2018-03-16 ENCOUNTER — Ambulatory Visit
Admission: RE | Admit: 2018-03-16 | Discharge: 2018-03-16 | Disposition: A | Discharge status: Home / Self Care | Payer: BLUE CROSS/BLUE SHIELD | Source: Ambulatory Visit | Attending: Gastroenterology | Admitting: Gastroenterology

## 2018-03-16 DIAGNOSIS — K760 Fatty (change of) liver, not elsewhere classified: Secondary | ICD-10-CM | POA: Diagnosis not present

## 2018-03-16 DIAGNOSIS — K769 Liver disease, unspecified: Secondary | ICD-10-CM

## 2018-03-16 MED ORDER — GADOBENATE DIMEGLUMINE 529 MG/ML IV SOLN
20.0000 mL | Freq: Once | INTRAVENOUS | Status: AC | PRN
  Administered 2018-03-16: 20 mL via INTRAVENOUS

## 2018-03-19 DIAGNOSIS — R197 Diarrhea, unspecified: Secondary | ICD-10-CM | POA: Diagnosis not present

## 2018-03-27 ENCOUNTER — Encounter: Payer: Self-pay | Admitting: Family Medicine

## 2018-04-04 ENCOUNTER — Encounter: Payer: Self-pay | Admitting: Family Medicine

## 2018-04-04 ENCOUNTER — Ambulatory Visit: Payer: BLUE CROSS/BLUE SHIELD | Admitting: Family Medicine

## 2018-04-04 ENCOUNTER — Other Ambulatory Visit: Payer: Self-pay

## 2018-04-04 VITALS — BP 107/74 | HR 113 | Temp 98.2°F | Resp 18 | Ht 70.63 in | Wt 260.6 lb

## 2018-04-04 DIAGNOSIS — R110 Nausea: Secondary | ICD-10-CM | POA: Diagnosis not present

## 2018-04-04 DIAGNOSIS — R1013 Epigastric pain: Secondary | ICD-10-CM

## 2018-04-04 DIAGNOSIS — R1012 Left upper quadrant pain: Secondary | ICD-10-CM

## 2018-04-04 LAB — IFOBT (OCCULT BLOOD): IMMUNOLOGICAL FECAL OCCULT BLOOD TEST: NEGATIVE

## 2018-04-04 NOTE — Patient Instructions (Addendum)
I can refill meds once more until follow up to discuss your routine meds in next month.   MRI of abdomen was reassuring.   I will check a few tests for the upper abdominal pain. Continue bland diet for now. Symptoms may continue to improve since recent treatment of diverticulitis. Continue nexium for now, bentyl if needed for now. Please call and schedule appointment with gastroenterology. Return to the clinic or go to the nearest emergency room if any of your symptoms worsen or new symptoms occur.   Abdominal Pain, Adult Abdominal pain can be caused by many things. Often, abdominal pain is not serious and it gets better with no treatment or by being treated at home. However, sometimes abdominal pain is serious. Your health care provider will do a medical history and a physical exam to try to determine the cause of your abdominal pain. Follow these instructions at home:  Take over-the-counter and prescription medicines only as told by your health care provider. Do not take a laxative unless told by your health care provider.  Drink enough fluid to keep your urine clear or pale yellow.  Watch your condition for any changes.  Keep all follow-up visits as told by your health care provider. This is important. Contact a health care provider if:  Your abdominal pain changes or gets worse.  You are not hungry or you lose weight without trying.  You are constipated or have diarrhea for more than 2-3 days.  You have pain when you urinate or have a bowel movement.  Your abdominal pain wakes you up at night.  Your pain gets worse with meals, after eating, or with certain foods.  You are throwing up and cannot keep anything down.  You have a fever. Get help right away if:  Your pain does not go away as soon as your health care provider told you to expect.  You cannot stop throwing up.  Your pain is only in areas of the abdomen, such as the right side or the left lower portion of the  abdomen.  You have bloody or black stools, or stools that look like tar.  You have severe pain, cramping, or bloating in your abdomen.  You have signs of dehydration, such as: ? Dark urine, very little urine, or no urine. ? Cracked lips. ? Dry mouth. ? Sunken eyes. ? Sleepiness. ? Weakness. This information is not intended to replace advice given to you by your health care provider. Make sure you discuss any questions you have with your health care provider. Document Released: 08/01/2005 Document Revised: 05/11/2016 Document Reviewed: 04/04/2016 Elsevier Interactive Patient Education  2018 [employer].   Diverticulitis Diverticulitis is infection or inflammation of small pouches (diverticula) in the colon that form due to a condition called diverticulosis. Diverticula can trap stool (feces) and bacteria, causing infection and inflammation. Diverticulitis may cause severe stomach pain and diarrhea. It may lead to tissue damage in the colon that causes bleeding. The diverticula may also burst (rupture) and cause infected stool to enter other areas of the abdomen. Complications of diverticulitis can include:  Bleeding.  Severe infection.  Severe pain.  Rupture (perforation) of the colon.  Blockage (obstruction) of the colon.  What are the causes? This condition is caused by stool becoming trapped in the diverticula, which allows bacteria to grow in the diverticula. This leads to inflammation and infection. What increases the risk? You are more likely to develop this condition if:  You have diverticulosis. The risk for  diverticulosis increases if: ? You are overweight or obese. ? You use tobacco products. ? You do not get enough exercise.  You eat a diet that does not include enough fiber. High-fiber foods include fruits, vegetables, beans, nuts, and whole grains.  What are the signs or symptoms? Symptoms of this condition may include:  Pain and tenderness in the  abdomen. The pain is normally located on the left side of the abdomen, but it may occur in other areas.  Fever and chills.  Bloating.  Cramping.  Nausea.  Vomiting.  Changes in bowel routines.  Blood in your stool.  How is this diagnosed? This condition is diagnosed based on:  Your medical history.  A physical exam.  Tests to make sure there is nothing else causing your condition. These tests may include: ? Blood tests. ? Urine tests. ? Imaging tests of the abdomen, including X-rays, ultrasounds, MRIs, or CT scans.  How is this treated? Most cases of this condition are mild and can be treated at home. Treatment may include:  Taking over-the-counter pain medicines.  Following a clear liquid diet.  Taking antibiotic medicines by mouth.  Rest.  More severe cases may need to be treated at a hospital. Treatment may include:  Not eating or drinking.  Taking prescription pain medicine.  Receiving antibiotic medicines through an IV tube.  Receiving fluids and nutrition through an IV tube.  Surgery.  When your condition is under control, your health care provider may recommend that you have a colonoscopy. This is an exam to look at the entire large intestine. During the exam, a lubricated, bendable tube is inserted into the anus and then passed into the rectum, colon, and other parts of the large intestine. A colonoscopy can show how severe your diverticula are and whether something else may be causing your symptoms. Follow these instructions at home: Medicines  Take over-the-counter and prescription medicines only as told by your health care provider. These include fiber supplements, probiotics, and stool softeners.  If you were prescribed an antibiotic medicine, take it as told by your health care provider. Do not stop taking the antibiotic even if you start to feel better.  Do not drive or use heavy machinery while taking prescription pain medicine. General  instructions  Follow a full liquid diet or another diet as directed by your health care provider. After your symptoms improve, your health care provider may tell you to change your diet. He or she may recommend that you eat a diet that contains at least 25 g (25 grams) of fiber daily. Fiber makes it easier to pass stool. Healthy sources of fiber include: ? Berries. One cup contains 4-8 grams of fiber. ? Beans or lentils. One half cup contains 5-8 grams of fiber. ? Green vegetables. One cup contains 4 grams of fiber.  Exercise for at least 30 minutes, 3 times each week. You should exercise hard enough to raise your heart rate and break a sweat.  Keep all follow-up visits as told by your health care provider. This is important. You may need a colonoscopy. Contact a health care provider if:  Your pain does not improve.  You have a hard time drinking or eating food.  Your bowel movements do not return to normal. Get help right away if:  Your pain gets worse.  Your symptoms do not get better with treatment.  Your symptoms suddenly get worse.  You have a fever.  You vomit more than one time.  You have  stools that are bloody, black, or tarry. Summary  Diverticulitis is infection or inflammation of small pouches (diverticula) in the colon that form due to a condition called diverticulosis. Diverticula can trap stool (feces) and bacteria, causing infection and inflammation.  You are at higher risk for this condition if you have diverticulosis and you eat a diet that does not include enough fiber.  Most cases of this condition are mild and can be treated at home. More severe cases may need to be treated at a hospital.  When your condition is under control, your health care provider may recommend that you have an exam called a colonoscopy. This exam can show how severe your diverticula are and whether something else may be causing your symptoms. This information is not intended to replace  advice given to you by your health care provider. Make sure you discuss any questions you have with your health care provider. Document Released: 08/01/2005 Document Revised: 11/24/2016 Document Reviewed: 11/24/2016 Elsevier Interactive Patient Education  2018 [employer].       IF you received an x-ray today, you will receive an invoice from [facility]. Please contact [facility] at [phone] with questions or concerns regarding your invoice.   IF you received labwork today, you will receive an invoice from [facility]. Please contact [facility] at [phone] with questions or concerns regarding your invoice.   Our billing staff will not be able to assist you with questions regarding bills from these companies.  You will be contacted with the lab results as soon as they are available. The fastest way to get your results is to activate your My Chart account. Instructions are located on the last page of this paperwork. If you have not heard from [place] regarding the results in 2 weeks, please contact this office.

## 2018-04-04 NOTE — Progress Notes (Signed)
Subjective:  By signing my name below, I, [name], attest that this documentation has been prepared under the direction and in the presence of Randall Ray, MD. Electronically Signed: [name], [place]. 04/04/2018 , 4:19 PM .  Patient was seen in Room 1 .   Patient ID: Randall Reed., male    DOB: 12-27-1970, 47 y.o.   MRN: [id number] Chief Complaint  Patient presents with  . Abdominal Pain    X 4-5 mth off and on  . Medication Refill    proair hfa, xanax, flexeril, bentyl, truvada, nexium, flonase,advair, proctozone-he, vosol-hc, atrovent, atrovent hfa  . Depression    screening done  . Anxiety    screening done   HPI Randall Reed. is a 47 y.o. male  Here for multiple concerns today, medication refills and abdominal pain. He will be heading to a convention in [place] for a convention tomorrow morning. He has heart CT scheduled on June 20th.   Abdominal pain He has a history of GERD, and IBS, followed by [name], Dr. [name] with last office visit on April 11th. He recently had some abdominal pain with CT obtained in [place], [place], indicating minor diverticulitis and small liver lesion, treated with flagyl and Cipro. He had MRI on May 12th, he had a stable appearing benign septated cyst within left lobe of liver, and hepatic steatosis.   Patient notes acute abdominal attack while in [place], where he had to sit on the toilet and just had yellow loose bowels. In the past few days, he's had intermittent upper abdominal pain. After he eats, he can feel upper abdominal mild discomfort. If he lays on the left side, he has more discomfort. He hasn't been eating raw vegetables. He was having looser stools previously, but more normal now. He doesn't have a follow up with GI. He was previously followed by Dr. [name], but now referred to Dr. [name]. He denies urinary symptoms or hematuria. He also had Hep testing without results back yet. He had an abdominal  profile done in [place], that was normal. He notes nausea without vomiting. He describes bowels having inconsistent discoloration.    Medication refill  He requests Ambien, as it'll run out during his trip.   Depression screening Depression screen [facility] 2/9 04/04/2018 12/27/2017 09/27/2017 03/07/2017 01/21/2017  Decreased Interest 0 0 0 1 0  Down, Depressed, Hopeless 1 0 0 0 0  PHQ - 2 Score 1 0 0 1 0  Altered sleeping 2 - - - -  Tired, decreased energy 1 - - - -  Change in appetite 2 - - - -  Feeling bad or failure about yourself  0 - - - -  Trouble concentrating 0 - - - -  Moving slowly or fidgety/restless 0 - - - -  Suicidal thoughts 0 - - - -  PHQ-9 Score 6 - - - -  Difficult doing work/chores Somewhat difficult - - - -     Patient Active Problem List   Diagnosis Date Noted  . Irritable bowel syndrome 03/14/2016  . Asthma 11/19/2015  . 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 rotator cuff tear 10/17/2013  . Hypogonadism male 09/04/2013  . ED (erectile dysfunction) 07/30/2013  . Palpitations 02/21/2012  . BMI 33.0-33.9,adult 02/04/2012  . Allergic rhinitis 02/04/2012  . GERD (gastroesophageal reflux disease) 02/04/2012  . Migraine 02/04/2012  . GAD (generalized anxiety disorder) 02/04/2012  . Insomnia 02/04/2012  . HYPERTENSION, BENIGN 04/13/2009  .  CHEST PAIN-UNSPECIFIED 04/13/2009   Past Medical History:  Diagnosis Date  . Anxiety   . Arthritis   . Asthma   . Depression   . GERD (gastroesophageal reflux disease)   . Seasonal allergic reaction    Past Surgical History:  Procedure Laterality Date  . none    . none     Allergies  Allergen Reactions  . Desloratadine Other (See Comments)    CLARINEX-"severe headache"  . Hydrocod Polst-Cpm Polst Er Itching  . Loratadine Other (See Comments)    "severe headache"  . Tussionex Pennkinetic Er [Hydrocod Polst-Cpm Polst Er] Itching  . Levbid [Hyoscyamine Sulfate] Rash  . Telithromycin Rash    Prior to Admission medications   Medication Sig Start Date End Date Taking? Authorizing Provider  acetic acid-hydrocortisone (VOSOL-HC) otic solution Place 3 drops into both ears 3 (three) times daily. Use as needed to prevent swimmer's ear 01/10/15   Leandrew Koyanagi, MD  ALPRAZolam [name]) 1 MG tablet TAKE 1 TABLET BY MOUTH TWICE DAILY AS NEEDED 03/14/18   [name], MD  amLODipine (NORVASC) 10 MG tablet Take 1 tablet (10 mg total) by mouth daily. Please keep upcoming appt for future refills. Thank you 01/17/18 01/12/19  Sherren Mocha, MD  aspirin EC 81 MG tablet Take 1 tablet (81 mg total) by mouth daily. 03/07/15   Sherren Mocha, MD  chlorthalidone (HYGROTON) 25 MG tablet Take 25 mg by mouth daily.    [provider]  cyclobenzaprine (FLEXERIL) 10 MG tablet Take 1 tablet (10 mg total) by mouth 3 (three) times daily as needed for muscle spasms. 04/20/16   Leandrew Koyanagi, MD  dicyclomine (BENTYL) 20 MG tablet Take 1 tablet (20 mg total) by mouth every 6 (six) hours as needed. 09/27/17   [name], MD  emtricitabine-tenofovir (TRUVADA) 200-300 MG tablet Take 1 tablet by mouth daily. 09/27/17   [name], MD  esomeprazole (NEXIUM) 40 MG capsule Take 1 capsule (40 mg total) by mouth 2 (two) times daily before a meal. 09/27/17   [name], MD  Fexofenadine HCl (ALLEGRA PO) Take 1 tablet by mouth daily.     [provider]  fluticasone (FLONASE) 50 MCG/ACT nasal spray INSTILL 2 SPRAYS IN EACH NOSTRIL DAILY 09/27/17   [name], MD  Fluticasone-Salmeterol (ADVAIR DISKUS) 250-50 MCG/DOSE AEPB INHALE 1 PUFF INTO THE LUNGS TWICE DAILY AS NEEDED FOR ALLERGIES OR WHEEZING OR SHORTNESS OF BREATH 09/27/17   [name], MD  ipratropium (ATROVENT HFA) 17 MCG/ACT inhaler INHALE 2 PUFFS INTO THE LUNGS TWICE DAILY OR AS DIRECTED 04/20/16   Leandrew Koyanagi, MD  ipratropium (ATROVENT) 0.06 % nasal spray Place 2 sprays into the nose 2 (two) times  daily. 09/27/17   [name], MD  losartan (COZAAR) 100 MG tablet Take 100 mg by mouth daily.    [provider]  losartan (COZAAR) 100 MG tablet TAKE 1 TABLET(100 MG) BY MOUTH DAILY 01/21/18   Sherren Mocha, MD  metFORMIN (GLUCOPHAGE-XR) 500 MG 24 hr tablet Take 500 mg by mouth 4 (four) times daily.    [provider]  metoprolol tartrate (LOPRESSOR) 50 MG tablet Take 1 tablet (50 mg total) by mouth once for 1 dose. Take 1 hour prior to your CT scan. 01/17/18 01/17/18  Sherren Mocha, MD  montelukast (SINGULAIR) 10 MG tablet Take 1 tablet (10 mg total) by mouth at bedtime. 09/27/17   [name], MD  [facility] [phone])  MCG/ACT inhaler INHALE 2 PUFFS INTO THE LUNGS EVERY 6 HOURS AS NEEDED FOR WHEEZING OR SHORTNESS OF BREATH 11/12/17   [name], MD  PROCTOZONE-HC 2.5 % rectal cream USE RECTALLY TWICE DAILY 02/06/17   [name], MD  rizatriptan (MAXALT-MLT) 10 MG disintegrating tablet TAKE ONE TABLET BY MOUTH AS DIRECTED 01/16/18   [name], MD  tadalafil (CIALIS) 20 MG tablet Take 1 tablet (20 mg total) by mouth daily as needed. 09/27/17   [name], MD  testosterone cypionate (DEPOTESTOTERONE CYPIONATE) 100 MG/ML injection Inject 200 mg into the muscle every 14 (fourteen) days. For IM use only    [provider]  Vitamin D, Ergocalciferol, (DRISDOL) 50000 units CAPS capsule Take 50,000 Units by mouth every 7 (seven) days.    [provider]  zolpidem (AMBIEN) 10 MG tablet TAKE 1 TABLET BY MOUTH AT BEDTIME 03/14/18   [name], MD   Social History   Socioeconomic History  . Marital status: Single    Spouse name: Not on file  . Number of children: 0  . Years of education: Not on file  . Highest education level: Not on file  Occupational History  . 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 Financial resource strain: Not on file  . Food insecurity:    Worry: Not on file     Inability: Not on file  . Transportation needs:    Medical: Not on file    Non-medical: Not on file  Tobacco Use  . Smoking status: Never Smoker  . Smokeless tobacco: Never Used  Substance and Sexual Activity  . Alcohol use: Yes    Alcohol/week: 1.8 oz    Types: 3 Standard drinks or equivalent per week  . Drug use: No  . Sexual activity: Yes  Lifestyle  . Physical activity:    Days per week: Not on file    Minutes per session: Not on file  . Stress: Not on file  Relationships  . Social connections:    Talks on phone: Not on file    Gets together: Not on file    Attends religious service: Not on file    Active member of club or organization: Not on file    Attends meetings of clubs or organizations: Not on file    Relationship status: Not on file  . Intimate partner violence:    Fear of current or ex partner: Not on file    Emotionally abused: Not on file    Physically abused: Not on file    Forced sexual activity: Not on file  Other Topics Concern  . Not on file  Social History Narrative  . Not on file   Review of Systems  Constitutional: Negative for fatigue and unexpected weight change.  Eyes: Negative for visual disturbance.  Respiratory: Negative for cough, chest tightness and shortness of breath.   Cardiovascular: Negative for chest pain, palpitations and leg swelling.  Gastrointestinal: Positive for abdominal pain and nausea. Negative for blood in stool and vomiting.  Neurological: Negative for dizziness, light-headedness and headaches.       Objective:   Physical Exam  Constitutional: He is oriented to person, place, and time. He appears well-developed and well-nourished.  HENT:  Head: Normocephalic and atraumatic.  Eyes: Pupils are equal, round, and reactive to light. EOM are normal.  Neck: No JVD present. Carotid bruit is not present.  Cardiovascular: Normal rate, regular rhythm and normal heart sounds.  No murmur heard. Pulmonary/Chest: Effort normal  and breath sounds normal. He has no rales.  Abdominal: Soft. There is tenderness in the right upper quadrant, epigastric area and left upper quadrant. There is no tenderness at McBurney's point.  Tenderness over LUQ, and epigastric; mild RUQ tenderness, suprapubic non tender  Musculoskeletal: He exhibits no edema.  Neurological: He is alert and oriented to person, place, and time.  Skin: Skin is warm and dry.  Psychiatric: He has a normal mood and affect.  Vitals reviewed.   Vitals:   04/04/18 1515  BP: 107/74  Pulse: (!) 113  Resp: 18  Temp: 98.2 F (36.8 C)  TempSrc: Oral  SpO2: 94%  Weight: 260 lb 9.6 oz (118.2 kg)  Height: 5' 10.63" (1.794 m)   Results for orders placed or performed in visit on 04/04/18  IFOBT POC (occult bld, rslt in office)  Result Value Ref Range   IFOBT Negative        Assessment & Plan:    Bayler Gehrig. is a 47 y.o. male Abdominal pain, epigastric - Plan: CBC, Comprehensive metabolic panel, Lipase, IFOBT POC (occult bld, rslt in office), IFOBT POC (occult bld, rslt in office)  LUQ abdominal pain - Plan: CBC, Comprehensive metabolic panel, Lipase, IFOBT POC (occult bld, rslt in office), IFOBT POC (occult bld, rslt in office)  Nausea without vomiting  Prior diverticulitis that was treated with Cipro and Flagyl.  Recent MRI was reassuring.  Had some improvement in symptoms but still some discomfort.  History of IBS, and recurrent discomfort.    -Recommend he follow-up with gastroenterology, but will check CBC, CMP, lipase to look into her other causes.  Heme-negative stool in office, less likely peptic disease.  Continue PPI, Bentyl for IBS spasms.  -ER/RTC precautions of acute worsening  Plans on separate follow-up visit to review chronic medical problems and medications.  Can refill medications until that visit in the next few weeks.  No orders of the defined types were placed in this encounter.  Patient Instructions   I can refill meds  once more until follow up to discuss your routine meds in next month.   MRI of abdomen was reassuring.   I will check a few tests for the upper abdominal pain. Continue bland diet for now. Symptoms may continue to improve since recent treatment of diverticulitis. Continue nexium for now, bentyl if needed for now. Please call and schedule appointment with gastroenterology. Return to the clinic or go to the nearest emergency room if any of your symptoms worsen or new symptoms occur.   Abdominal Pain, Adult Abdominal pain can be caused by many things. Often, abdominal pain is not serious and it gets better with no treatment or by being treated at home. However, sometimes abdominal pain is serious. Your health care provider will do a medical history and a physical exam to try to determine the cause of your abdominal pain. Follow these instructions at home:  Take over-the-counter and prescription medicines only as told by your health care provider. Do not take a laxative unless told by your health care provider.  Drink enough fluid to keep your urine clear or pale yellow.  Watch your condition for any changes.  Keep all follow-up visits as told by your health care provider. This is important. Contact a health care provider if:  Your abdominal pain changes or gets worse.  You are not hungry or you lose weight without trying.  You are constipated or have diarrhea for  more than 2-3 days.  You have pain when you urinate or have a bowel movement.  Your abdominal pain wakes you up at night.  Your pain gets worse with meals, after eating, or with certain foods.  You are throwing up and cannot keep anything down.  You have a fever. Get help right away if:  Your pain does not go away as soon as your health care provider told you to expect.  You cannot stop throwing up.  Your pain is only in areas of the abdomen, such as the right side or the left lower portion of the abdomen.  You have  bloody or black stools, or stools that look like tar.  You have severe pain, cramping, or bloating in your abdomen.  You have signs of dehydration, such as: ? Dark urine, very little urine, or no urine. ? Cracked lips. ? Dry mouth. ? Sunken eyes. ? Sleepiness. ? Weakness. This information is not intended to replace advice given to you by your health care provider. Make sure you discuss any questions you have with your health care provider. Document Released: 08/01/2005 Document Revised: 05/11/2016 Document Reviewed: 04/04/2016 Elsevier Interactive Patient Education  2018 [employer].   Diverticulitis Diverticulitis is infection or inflammation of small pouches (diverticula) in the colon that form due to a condition called diverticulosis. Diverticula can trap stool (feces) and bacteria, causing infection and inflammation. Diverticulitis may cause severe stomach pain and diarrhea. It may lead to tissue damage in the colon that causes bleeding. The diverticula may also burst (rupture) and cause infected stool to enter other areas of the abdomen. Complications of diverticulitis can include:  Bleeding.  Severe infection.  Severe pain.  Rupture (perforation) of the colon.  Blockage (obstruction) of the colon.  What are the causes? This condition is caused by stool becoming trapped in the diverticula, which allows bacteria to grow in the diverticula. This leads to inflammation and infection. What increases the risk? You are more likely to develop this condition if:  You have diverticulosis. The risk for diverticulosis increases if: ? You are overweight or obese. ? You use tobacco products. ? You do not get enough exercise.  You eat a diet that does not include enough fiber. High-fiber foods include fruits, vegetables, beans, nuts, and whole grains.  What are the signs or symptoms? Symptoms of this condition may include:  Pain and tenderness in the abdomen. The pain is  normally located on the left side of the abdomen, but it may occur in other areas.  Fever and chills.  Bloating.  Cramping.  Nausea.  Vomiting.  Changes in bowel routines.  Blood in your stool.  How is this diagnosed? This condition is diagnosed based on:  Your medical history.  A physical exam.  Tests to make sure there is nothing else causing your condition. These tests may include: ? Blood tests. ? Urine tests. ? Imaging tests of the abdomen, including X-rays, ultrasounds, MRIs, or CT scans.  How is this treated? Most cases of this condition are mild and can be treated at home. Treatment may include:  Taking over-the-counter pain medicines.  Following a clear liquid diet.  Taking antibiotic medicines by mouth.  Rest.  More severe cases may need to be treated at a hospital. Treatment may include:  Not eating or drinking.  Taking prescription pain medicine.  Receiving antibiotic medicines through an IV tube.  Receiving fluids and nutrition through an IV tube.  Surgery.  When your condition is under  control, your health care provider may recommend that you have a colonoscopy. This is an exam to look at the entire large intestine. During the exam, a lubricated, bendable tube is inserted into the anus and then passed into the rectum, colon, and other parts of the large intestine. A colonoscopy can show how severe your diverticula are and whether something else may be causing your symptoms. Follow these instructions at home: Medicines  Take over-the-counter and prescription medicines only as told by your health care provider. These include fiber supplements, probiotics, and stool softeners.  If you were prescribed an antibiotic medicine, take it as told by your health care provider. Do not stop taking the antibiotic even if you start to feel better.  Do not drive or use heavy machinery while taking prescription pain medicine. General instructions  Follow a  full liquid diet or another diet as directed by your health care provider. After your symptoms improve, your health care provider may tell you to change your diet. He or she may recommend that you eat a diet that contains at least 25 g (25 grams) of fiber daily. Fiber makes it easier to pass stool. Healthy sources of fiber include: ? Berries. One cup contains 4-8 grams of fiber. ? Beans or lentils. One half cup contains 5-8 grams of fiber. ? Green vegetables. One cup contains 4 grams of fiber.  Exercise for at least 30 minutes, 3 times each week. You should exercise hard enough to raise your heart rate and break a sweat.  Keep all follow-up visits as told by your health care provider. This is important. You may need a colonoscopy. Contact a health care provider if:  Your pain does not improve.  You have a hard time drinking or eating food.  Your bowel movements do not return to normal. Get help right away if:  Your pain gets worse.  Your symptoms do not get better with treatment.  Your symptoms suddenly get worse.  You have a fever.  You vomit more than one time.  You have stools that are bloody, black, or tarry. Summary  Diverticulitis is infection or inflammation of small pouches (diverticula) in the colon that form due to a condition called diverticulosis. Diverticula can trap stool (feces) and bacteria, causing infection and inflammation.  You are at higher risk for this condition if you have diverticulosis and you eat a diet that does not include enough fiber.  Most cases of this condition are mild and can be treated at home. More severe cases may need to be treated at a hospital.  When your condition is under control, your health care provider may recommend that you have an exam called a colonoscopy. This exam can show how severe your diverticula are and whether something else may be causing your symptoms. This information is not intended to replace advice given to you by  your health care provider. Make sure you discuss any questions you have with your health care provider. Document Released: 08/01/2005 Document Revised: 11/24/2016 Document Reviewed: 11/24/2016 Elsevier Interactive Patient Education  2018 [employer].       IF you received an x-Reed today, you will receive an invoice from [facility]. Please contact [facility] at [phone] with questions or concerns regarding your invoice.   IF you received labwork today, you will receive an invoice from [place]. Please contact [facility] at [phone] with questions or concerns regarding your invoice.   Our billing staff will not be able to assist you with questions  regarding bills from these companies.  You will be contacted with the lab results as soon as they are available. The fastest way to get your results is to activate your My Chart account. Instructions are located on the last page of this paperwork. If you have not heard from [place] regarding the results in 2 weeks, please contact this office.      I personally performed the services described in this documentation, which was scribed in my presence. The recorded information has been reviewed and considered for accuracy and completeness, addended by me as needed, and agree with information above.  Signed,   Randall Ray, MD [facility] [place].  04/05/18 12:42 PM

## 2018-04-05 ENCOUNTER — Encounter: Payer: Self-pay | Admitting: Family Medicine

## 2018-04-05 LAB — COMPREHENSIVE METABOLIC PANEL
ALK PHOS: 68 IU/L (ref 39–117)
ALT: 36 IU/L (ref 0–44)
AST: 21 IU/L (ref 0–40)
Albumin/Globulin Ratio: 1.7 (ref 1.2–2.2)
Albumin: 4.5 g/dL (ref 3.5–5.5)
BUN/Creatinine Ratio: 11 (ref 9–20)
BUN: 12 mg/dL (ref 6–24)
Bilirubin Total: 0.5 mg/dL (ref 0.0–1.2)
CALCIUM: 9.2 mg/dL (ref 8.7–10.2)
CO2: 22 mmol/L (ref 20–29)
CREATININE: 1.08 mg/dL (ref 0.76–1.27)
Chloride: 100 mmol/L (ref 96–106)
GFR calc Af Amer: 93 mL/min/{1.73_m2} (ref >59)
GFR, EST NON AFRICAN AMERICAN: 80 mL/min/{1.73_m2} (ref >59)
GLOBULIN, TOTAL: 2.6 g/dL (ref 1.5–4.5)
GLUCOSE: 75 mg/dL (ref 65–99)
Potassium: 3.4 mmol/L — ABNORMAL LOW (ref 3.5–5.2)
SODIUM: 140 mmol/L (ref 134–144)
Total Protein: 7.1 g/dL (ref 6.0–8.5)

## 2018-04-05 LAB — CBC
HEMOGLOBIN: 18.7 g/dL — AB (ref 13.0–17.7)
Hematocrit: 52.8 % — ABNORMAL HIGH (ref 37.5–51.0)
MCH: 31.1 pg (ref 26.6–33.0)
MCHC: 35.4 g/dL (ref 31.5–35.7)
MCV: 88 fL (ref 79–97)
Platelets: 231 10*3/uL (ref 150–450)
RBC: 6.01 x10E6/uL — ABNORMAL HIGH (ref 4.14–5.80)
RDW: 13.6 % (ref 12.3–15.4)
WBC: 9 10*3/uL (ref 3.4–10.8)

## 2018-04-05 LAB — LIPASE: LIPASE: 44 U/L (ref 13–78)

## 2018-04-14 ENCOUNTER — Encounter: Payer: Self-pay | Admitting: Cardiovascular Disease

## 2018-04-14 DIAGNOSIS — I208 Other forms of angina pectoris: Secondary | ICD-10-CM

## 2018-04-14 DIAGNOSIS — I10 Essential (primary) hypertension: Secondary | ICD-10-CM

## 2018-04-15 ENCOUNTER — Encounter: Payer: Self-pay | Admitting: Cardiovascular Disease

## 2018-04-15 ENCOUNTER — Other Ambulatory Visit: Payer: Self-pay | Admitting: Family Medicine

## 2018-04-15 ENCOUNTER — Other Ambulatory Visit: Payer: Self-pay | Admitting: Cardiovascular Disease

## 2018-04-15 ENCOUNTER — Encounter (INDEPENDENT_AMBULATORY_CARE_PROVIDER_SITE_OTHER): Payer: Self-pay

## 2018-04-15 DIAGNOSIS — F411 Generalized anxiety disorder: Secondary | ICD-10-CM

## 2018-04-15 DIAGNOSIS — I10 Essential (primary) hypertension: Secondary | ICD-10-CM

## 2018-04-15 DIAGNOSIS — F5104 Psychophysiologic insomnia: Secondary | ICD-10-CM

## 2018-04-15 MED ORDER — METOPROLOL TARTRATE 50 MG PO TABS: 50.0000 mg | ORAL_TABLET | Freq: Once | ORAL | 0 refills | Status: DC

## 2018-04-15 MED ORDER — AMLODIPINE BESYLATE 5 MG PO TABS: 5.0000 mg | ORAL_TABLET | Freq: Every day | ORAL | 3 refills | Status: DC

## 2018-04-15 NOTE — Telephone Encounter (Signed)
Please advise on refills.  

## 2018-04-15 NOTE — Telephone Encounter (Signed)
Left message to call back  

## 2018-04-15 NOTE — Telephone Encounter (Signed)
This encounter was created in error - please disregard.

## 2018-04-15 NOTE — Telephone Encounter (Signed)
Rx refill request: Xanax 1 mg   Last filled: 03/14/18 #60                            Cialis 20 mg   Last filled: 02/21/18 #12                            Ambien 10 mg Last filled: 03/14/18 #30  LOV: 04/04/18  PCP: [place]: verified

## 2018-04-15 NOTE — Telephone Encounter (Signed)
Called patient to address several concerns in MyChart message.  1) he states he lost his metoprolol to be taking for cardiac CT. Informed him another dose would be called in for him to take 1 hour prior to visit.  2) he lost his instructions. Reviewed CT instructions with patient in great detail. Sent instructions via MyChart as well. 3) he states his BP has been fluctuating. He "felt weird" the other day. He checked his BP and it was 90/60. When he went to his PCP recently, his BP was 110/90. He states when his diastolic is 90 or over, his head hurts. He is currently asymptomatic. 4) his PCP drew lab work the other day and his K was 3.4. He was instructed to call Cardiology for instructions.   The patient does not wish at this time to stop any medications because diastolic over 90 gives him a headache. He does agree to DECREASE NORVASC to 5 mg daily. He will continue to check BP and call if BP uncontrolled.  Instructed him to increase K in his diet and reviewed several foods.   He understands he will be called if [name] has further instructions.

## 2018-04-15 NOTE — Telephone Encounter (Signed)
See last office visit, agreed to refill temporarily until planned follow-up to discuss these in further detail as other acute concern was discussed last visit.

## 2018-04-15 NOTE — Telephone Encounter (Signed)
New Message:        Pt is returning a call and pt states if he doesn't get a call until tomorrow he will be unavailable b/w the hours of 9-11.

## 2018-04-24 ENCOUNTER — Encounter: Payer: Self-pay | Admitting: Family Medicine

## 2018-04-24 ENCOUNTER — Ambulatory Visit (HOSPITAL_COMMUNITY): Payer: BLUE CROSS/BLUE SHIELD

## 2018-04-24 ENCOUNTER — Ambulatory Visit: Payer: BLUE CROSS/BLUE SHIELD | Admitting: Family Medicine

## 2018-04-24 ENCOUNTER — Ambulatory Visit (HOSPITAL_COMMUNITY)
Admission: RE | Admit: 2018-04-24 | Discharge: 2018-04-24 | Disposition: A | Discharge status: Home / Self Care | Payer: BLUE CROSS/BLUE SHIELD | Source: Ambulatory Visit | Attending: Cardiovascular Disease | Admitting: Cardiovascular Disease

## 2018-04-24 ENCOUNTER — Other Ambulatory Visit: Payer: Self-pay

## 2018-04-24 VITALS — BP 92/54 | HR 74 | Temp 98.6°F | Resp 16 | Ht 70.53 in | Wt 262.2 lb

## 2018-04-24 DIAGNOSIS — R079 Chest pain, unspecified: Secondary | ICD-10-CM

## 2018-04-24 DIAGNOSIS — G43109 Migraine with aura, not intractable, without status migrainosus: Secondary | ICD-10-CM | POA: Diagnosis not present

## 2018-04-24 DIAGNOSIS — Z1321 Encounter for screening for nutritional disorder: Secondary | ICD-10-CM

## 2018-04-24 DIAGNOSIS — J309 Allergic rhinitis, unspecified: Secondary | ICD-10-CM

## 2018-04-24 DIAGNOSIS — F5104 Psychophysiologic insomnia: Secondary | ICD-10-CM | POA: Diagnosis not present

## 2018-04-24 DIAGNOSIS — K580 Irritable bowel syndrome with diarrhea: Secondary | ICD-10-CM | POA: Diagnosis not present

## 2018-04-24 DIAGNOSIS — Z113 Encounter for screening for infections with a predominantly sexual mode of transmission: Secondary | ICD-10-CM | POA: Diagnosis not present

## 2018-04-24 DIAGNOSIS — Z79899 Other long term (current) drug therapy: Secondary | ICD-10-CM | POA: Diagnosis not present

## 2018-04-24 DIAGNOSIS — R0600 Dyspnea, unspecified: Secondary | ICD-10-CM

## 2018-04-24 DIAGNOSIS — I2510 Atherosclerotic heart disease of native coronary artery without angina pectoris: Secondary | ICD-10-CM | POA: Diagnosis not present

## 2018-04-24 DIAGNOSIS — E876 Hypokalemia: Secondary | ICD-10-CM | POA: Diagnosis not present

## 2018-04-24 DIAGNOSIS — J45909 Unspecified asthma, uncomplicated: Secondary | ICD-10-CM

## 2018-04-24 MED ORDER — IOPAMIDOL (ISOVUE-370) INJECTION 76%
INTRAVENOUS | Status: AC
Start: 2018-04-24 — End: 2018-04-24
  Filled 2018-04-24: qty 100

## 2018-04-24 MED ORDER — FLUTICASONE-SALMETEROL 250-50 MCG/DOSE IN AEPB: INHALATION_SPRAY | RESPIRATORY_TRACT | 5 refills | Status: DC

## 2018-04-24 MED ORDER — METOPROLOL TARTRATE 5 MG/5ML IV SOLN
5.0000 mg | INTRAVENOUS | Status: AC | PRN
  Administered 2018-04-24 (×2): 5 mg via INTRAVENOUS
  Filled 2018-04-24 (×3): qty 5

## 2018-04-24 MED ORDER — METOPROLOL TARTRATE 5 MG/5ML IV SOLN
INTRAVENOUS | Status: AC
  Filled 2018-04-24: qty 5

## 2018-04-24 MED ORDER — RIZATRIPTAN BENZOATE 10 MG PO TBDP: 10.0000 mg | ORAL_TABLET | ORAL | 1 refills | Status: DC

## 2018-04-24 MED ORDER — ESOMEPRAZOLE MAGNESIUM 40 MG PO CPDR: 40.0000 mg | DELAYED_RELEASE_CAPSULE | Freq: Two times a day (BID) | ORAL | 2 refills | Status: DC

## 2018-04-24 MED ORDER — EMTRICITABINE-TENOFOVIR DF 200-300 MG PO TABS: 1.0000 | ORAL_TABLET | Freq: Every day | ORAL | 2 refills | Status: DC

## 2018-04-24 MED ORDER — MONTELUKAST SODIUM 10 MG PO TABS: 10.0000 mg | ORAL_TABLET | Freq: Every day | ORAL | 6 refills | Status: DC

## 2018-04-24 MED ORDER — METOPROLOL TARTRATE 5 MG/5ML IV SOLN
INTRAVENOUS | Status: AC
  Administered 2018-04-24: 5 mg
  Filled 2018-04-24: qty 10

## 2018-04-24 MED ORDER — NITROGLYCERIN 0.4 MG SL SUBL
SUBLINGUAL_TABLET | SUBLINGUAL | Status: AC
  Filled 2018-04-24: qty 2

## 2018-04-24 MED ORDER — NITROGLYCERIN 0.4 MG SL SUBL
0.8000 mg | SUBLINGUAL_TABLET | Freq: Once | SUBLINGUAL | Status: AC
  Administered 2018-04-24: 0.8 mg via SUBLINGUAL
  Filled 2018-04-24: qty 25

## 2018-04-24 MED ORDER — ALBUTEROL SULFATE HFA 108 (90 BASE) MCG/ACT IN AERS: INHALATION_SPRAY | RESPIRATORY_TRACT | 0 refills | Status: DC

## 2018-04-24 MED ORDER — IOPAMIDOL (ISOVUE-370) INJECTION 76%
100.0000 mL | Freq: Once | INTRAVENOUS | Status: AC | PRN
  Administered 2018-04-24: 80 mL via INTRAVENOUS

## 2018-04-24 MED ORDER — DICYCLOMINE HCL 20 MG PO TABS: 20.0000 mg | ORAL_TABLET | Freq: Four times a day (QID) | ORAL | 2 refills | Status: DC | PRN

## 2018-04-24 NOTE — Patient Instructions (Addendum)
Call or send request for Ambien and xanax when due for refill. Will need to meet to discuss these meds again in 6 months.   As we have discussed, you could try taking an inhaled steroid alone (Qvar, flovent) in place of the Advair as needed. Please follow up to discuss that further if that is something you want to try. Continue singulair daily, albuterol as needed.   For migraine headaches, please follow up with headache specialist in [place], but will refill Maxalt for now.   Blood pressure is a little low today, likely from meds given this morning. Rest today, monitor your blood pressure meds and home and coordinate meds with your cardiologist. If feeling lightheaded or dizzy, be seen in ER. Return to the clinic or go to the nearest emergency room if any of your symptoms worsen or new symptoms occur.  No change in truvada dose. I will check tests today.   I refilled the nexium and bentyl today, but keep follow up with gastroenterology.   Thanks for coming in today.    IF you received an x-ray today, you will receive an invoice from [facility]. Please contact [facility] at [phone] with questions or concerns regarding your invoice.   IF you received labwork today, you will receive an invoice from [facility]. Please contact [facility] at [phone] with questions or concerns regarding your invoice.   Our billing staff will not be able to assist you with questions regarding bills from these companies.  You will be contacted with the lab results as soon as they are available. The fastest way to get your results is to activate your My Chart account. Instructions are located on the last page of this paperwork. If you have not heard from [place] regarding the results in 2 weeks, please contact this office.

## 2018-04-24 NOTE — Progress Notes (Signed)
Subjective:  By signing my name below, I, Randall Reed, attest that this documentation has been prepared under the direction and in the presence of Randall Ray, MD. Electronically Signed: Moises Reed, [place]. 04/24/2018 , 2:08 PM .  Patient was seen in Room 10 .   Patient ID: Randall Reed., male    DOB: 1970-11-22, 47 y.o.   MRN: [id number] Chief Complaint  Patient presents with  . Medication Refill    xanax, maxalt, and zolpidem   HPI Randall Reed. is a 47 y.o. male Here for medication refills.   General anxiety with insomnia He has had intolerances to multiple SSRI's. He takes xanax 1 mg 1-2 tablets per day with Ambien at bed time if needed. He has had a migraine improved with use of Ambien. He was previously taking xanax tid. Last prescription xanax #60 on June 11th, and Ambien #30 on June 11th. He denies any known sleep walking.   Hypogonadism He is followed by endocrinology for hypogonadism.   Asthma with allergies He's used Advair prn in the past, up to twice per day, and sometimes not at all. He also uses Atrovent and Flonase nasal spray. He also uses Singulair 10 mg qd for asthma control.   He doesn't use Advair everyday, about 2-3 times a month, depending if he feels it coming. He doesn't have to take it everyday. He uses his albuterol with exercise base, about 2-3 times a week. He's been taking Singulair every day.   Migraine headaches Discussed specialist in [place] where he spends most of his time. He does have migraines with aura, using Maxalt as needed. He was last prescribed #9 with 1 refill in March.   He hasn't followed up with neurologist in [place] yet.   Vitamin D Followed by doctor in [place]. Last checked Vitamin D in April. He is on prescription vitamin D 50,000 units per week.   Hypokalemia He had potassium level of 3.4 on 04/04/18.   HTN He had a cardiac CT this morning, and to bring his BP down. He had metoprolol 50 mg and then  via IV. Then, during the CT, his heart rate had spiked, so he was given 2 nitroglycerin. He also took Losartan this morning. He denies taken chlorthalidone today. His amlodipine was changed from 10 mg to 5 mg. He's followed by [name] and [name]. He denies lightheadedness or dizziness, but does note "feeling off".   GERD with IBS He's been evaluated by GI, Dr. [name] and then treated for diverticulitis in April by Dr. [name] with [name]. Recommended him to follow up with GI for abdominal symptoms. He was continued on Nexium 40 mg bid and bentyl bid as well for IBS symptoms.   HIV pre-exposure prophylaxis He's taken Truvada 200-300 mg qd. STI testing in Nov was negative/non reactive for HIV, syphilis, chlamydia, and gonorrhea. Renal function normal at that time, as well as May 31st. He denies any unprotected intercourse. He denies having any side effects with his Truvada.   Patient Active Problem List   Diagnosis Date Noted  . Irritable bowel syndrome 03/14/2016  . Asthma 11/19/2015  . 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 rotator cuff tear 10/17/2013  . Hypogonadism male 09/04/2013  . ED (erectile dysfunction) 07/30/2013  . Palpitations 02/21/2012  . BMI 33.0-33.9,adult 02/04/2012  . Allergic rhinitis 02/04/2012  . GERD (gastroesophageal reflux disease) 02/04/2012  . Migraine 02/04/2012  . GAD (generalized anxiety disorder) 02/04/2012  . Insomnia  02/04/2012  . HYPERTENSION, BENIGN 04/13/2009  . CHEST PAIN-UNSPECIFIED 04/13/2009   Past Medical History:  Diagnosis Date  . Anxiety   . Arthritis   . Asthma   . Depression   . GERD (gastroesophageal reflux disease)   . Seasonal allergic reaction    Past Surgical History:  Procedure Laterality Date  . none    . none     Allergies  Allergen Reactions  . Desloratadine Other (See Comments)    CLARINEX-"severe headache"  . Hydrocod Polst-Cpm Polst Er Itching  . Loratadine Other (See Comments)     "severe headache"  . Tussionex Pennkinetic Er [Hydrocod Polst-Cpm Polst Er] Itching  . Levbid [Hyoscyamine Sulfate] Rash  . Telithromycin Rash   Prior to Admission medications   Medication Sig Start Date End Date Taking? Authorizing Provider  acetic acid-hydrocortisone (VOSOL-HC) otic solution Place 3 drops into both ears 3 (three) times daily. Use as needed to prevent swimmer's ear 01/10/15   Leandrew Koyanagi, MD  ALPRAZolam [name]) 1 MG tablet TAKE 1 TABLET BY MOUTH TWICE DAILY AS NEEDED 04/15/18   [name], MD  amLODipine (NORVASC) 5 MG tablet Take 1 tablet (5 mg total) by mouth daily. 04/15/18 04/10/19  Sherren Mocha, MD  aspirin EC 81 MG tablet Take 1 tablet (81 mg total) by mouth daily. 03/07/15   Sherren Mocha, MD  chlorthalidone (HYGROTON) 25 MG tablet TAKE 1 TABLET(25 MG) BY MOUTH DAILY 04/15/18   Sherren Mocha, MD  cyclobenzaprine (FLEXERIL) 10 MG tablet Take 1 tablet (10 mg total) by mouth 3 (three) times daily as needed for muscle spasms. 04/20/16   Leandrew Koyanagi, MD  dicyclomine (BENTYL) 20 MG tablet Take 1 tablet (20 mg total) by mouth every 6 (six) hours as needed. 09/27/17   [name], MD  emtricitabine-tenofovir (TRUVADA) 200-300 MG tablet Take 1 tablet by mouth daily. 09/27/17   [name], MD  esomeprazole (NEXIUM) 40 MG capsule Take 1 capsule (40 mg total) by mouth 2 (two) times daily before a meal. 09/27/17   [name], MD  Fexofenadine HCl (ALLEGRA PO) Take 1 tablet by mouth daily.     [provider]  fluticasone (FLONASE) 50 MCG/ACT nasal spray INSTILL 2 SPRAYS IN EACH NOSTRIL DAILY 09/27/17   [name], MD  Fluticasone-Salmeterol (ADVAIR DISKUS) 250-50 MCG/DOSE AEPB INHALE 1 PUFF INTO THE LUNGS TWICE DAILY AS NEEDED FOR ALLERGIES OR WHEEZING OR SHORTNESS OF BREATH 09/27/17   [name], MD  ipratropium (ATROVENT HFA) 17 MCG/ACT inhaler INHALE 2 PUFFS INTO THE LUNGS TWICE DAILY OR AS DIRECTED 04/20/16   Leandrew Koyanagi, MD  ipratropium (ATROVENT) 0.06 % nasal spray Place 2 sprays into the nose 2 (two) times daily. 09/27/17   [name], MD  losartan (COZAAR) 100 MG tablet TAKE 1 TABLET(100 MG) BY MOUTH DAILY 01/21/18   Sherren Mocha, MD  metFORMIN (GLUCOPHAGE-XR) 500 MG 24 hr tablet Take 500 mg by mouth 4 (four) times daily.    [provider]  metoprolol tartrate (LOPRESSOR) 50 MG tablet Take 1 tablet (50 mg total) by mouth once for 1 dose. Take 1 hour prior to your CT scan. 04/15/18 04/15/18  Sherren Mocha, MD  montelukast (SINGULAIR) 10 MG tablet Take 1 tablet (10 mg total) by mouth at bedtime. 09/27/17   [name], MD  PROAIR HFA 108 [phone] Base) MCG/ACT inhaler INHALE 2 PUFFS INTO THE LUNGS EVERY 6 HOURS AS NEEDED FOR WHEEZING OR SHORTNESS OF  BREATH 11/12/17   [name], MD  PROCTOZONE-HC 2.5 % rectal cream USE RECTALLY TWICE DAILY 02/06/17   [name], MD  rizatriptan (MAXALT-MLT) 10 MG disintegrating tablet TAKE ONE TABLET BY MOUTH AS DIRECTED 01/16/18   [name], MD  tadalafil (ADCIRCA/CIALIS) 20 MG tablet TAKE 1 TABLET(20 MG) BY MOUTH DAILY AS NEEDED 04/15/18   [name], MD  testosterone cypionate (DEPOTESTOTERONE CYPIONATE) 100 MG/ML injection Inject 200 mg into the muscle every 14 (fourteen) days. For IM use only    [provider]  Vitamin D, Ergocalciferol, (DRISDOL) 50000 units CAPS capsule Take 50,000 Units by mouth every 7 (seven) days.    [provider]  zolpidem (AMBIEN) 10 MG tablet TAKE 1 TABLET BY MOUTH AT BEDTIME 04/15/18   [name], MD   Social History   Socioeconomic History  . Marital status: Single    Spouse name: Not on file  . Number of children: 0  . Years of education: Not on file  . Highest education level: Not on file  Occupational History  . 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 Financial resource strain: Not on file  . Food insecurity:    Worry: Not  on file    Inability: Not on file  . Transportation needs:    Medical: Not on file    Non-medical: Not on file  Tobacco Use  . Smoking status: Never Smoker  . Smokeless tobacco: Never Used  Substance and Sexual Activity  . Alcohol use: Yes    Alcohol/week: 1.8 oz    Types: 3 Standard drinks or equivalent per week  . Drug use: No  . Sexual activity: Yes  Lifestyle  . Physical activity:    Days per week: Not on file    Minutes per session: Not on file  . Stress: Not on file  Relationships  . Social connections:    Talks on phone: Not on file    Gets together: Not on file    Attends religious service: Not on file    Active member of club or organization: Not on file    Attends meetings of clubs or organizations: Not on file    Relationship status: Not on file  . Intimate partner violence:    Fear of current or ex partner: Not on file    Emotionally abused: Not on file    Physically abused: Not on file    Forced sexual activity: Not on file  Other Topics Concern  . Not on file  Social History Narrative  . Not on file   Review of Systems  Constitutional: Negative for fatigue and unexpected weight change.  Eyes: Negative for visual disturbance.  Respiratory: Negative for cough, chest tightness and shortness of breath.   Cardiovascular: Negative for chest pain, palpitations and leg swelling.  Gastrointestinal: Negative for abdominal pain and Reed in stool.  Neurological: Negative for dizziness, light-headedness and headaches.       Objective:   Physical Exam  Constitutional: He is oriented to person, place, and time. He appears well-developed and well-nourished.  HENT:  Head: Normocephalic and atraumatic.  Eyes: Pupils are equal, round, and reactive to light. EOM are normal.  Neck: No JVD present. Carotid bruit is not present.  Cardiovascular: Normal rate, regular rhythm and normal heart sounds.  No murmur heard. Pulmonary/Chest: Effort normal and breath sounds  normal. He has no rales.  Abdominal:  Slight diffuse tenderness over upper abdomen  Musculoskeletal: He exhibits no edema.  Neurological: He is alert and oriented to person, place, and time.  Skin: Skin is warm and dry.  Psychiatric: He has a normal mood and affect.  Vitals reviewed.   Vitals:   04/24/18 1328 04/24/18 1410  BP: (!) 89/57 (!) 92/54  Pulse: 74   Resp: 16   Temp: 98.6 F (37 C)   TempSrc: Oral   SpO2: 95%   Weight: 262 lb 3.2 oz (118.9 kg)   Height: 5' 10.53" (1.791 m)        Assessment & Plan:   Rumaldo Difatta. is a 47 y.o. male Hypokalemia - Plan: Basic metabolic panel  -Noted on prior testing, repeat BMP  Psychophysiological insomnia  -Overall stable with current dosing of Xanax for anxiety symptoms as well as Ambien at bedtime.  No change in regimen for now, potential side effects and additive side effects of these medications have been discussed previously, denies any new parasomnias.  Migraine with aura and without status migrainosus, not intractable - Plan: rizatriptan (MAXALT-MLT) 10 MG disintegrating tablet  -Stable with intermittent Maxalt dosing.  With still persistent need for Maxalt, recommended follow-up with headache specialist in [place].  He plans on setting up that appointment.  Uncomplicated asthma, unspecified asthma severity, unspecified whether persistent - Plan: albuterol (PROAIR HFA) 108 (90 Base) MCG/ACT inhaler, montelukast (SINGULAIR) 10 MG tablet, Fluticasone-Salmeterol (ADVAIR DISKUS) 250-50 MCG/DOSE AEPB  -Stable with current regimen.  Discussed daily dose of low-dose inhaled corticosteroid, but he would prefer to remain on as needed dosing of Advair.  No change in regimen for now, including Singulair daily.  Albuterol if needed.  New prescription given.  Allergic rhinitis, unspecified seasonality, unspecified trigger - Plan: montelukast (SINGULAIR) 10 MG tablet  -Stable with Singulair, continue same dose as well as Flonase or  other nasal spray as needed.  Irritable bowel syndrome with diarrhea - Plan: esomeprazole (NEXIUM) 40 MG capsule, dicyclomine (BENTYL) 20 MG tablet  -Stable on current regimen, continue on Nexium and Bentyl as needed at current doses  Encounter for screening for nutritional disorder - Plan: Vitamin B12, Vitamin D, 25-hydroxy, Magnesium Current use of proton pump inhibitor - Plan: Vitamin B12, Vitamin D, 25-hydroxy, Magnesium  -Due to continued dose of Nexium at 80 mg daily, will check for nutritional deficiencies.  Currently being treated for vitamin D deficiency which may be related in part to PPI use.  Routine screening for STI (sexually transmitted infection) - Plan: emtricitabine-tenofovir (TRUVADA) 200-300 MG tablet, GC/Chlamydia Probe Amp, RPR, HIV antibody  Preexposure HIV prophylaxis with Truvada, STI testing as above.  Tolerating current dose of Truvada, will also check renal function panel with above labs. -Recheck testing in 6 months.  Slightly low Reed pressure in office, suspected related to medications given earlier in day for other testing.  Slight fatigue currently but otherwise denies near syncope.  Recommended hydration, relative rest, and RTC/ER precautions if more symptomatic.  Meds ordered this encounter  Medications  . rizatriptan (MAXALT-MLT) 10 MG disintegrating tablet    Sig: Take 1 tablet (10 mg total) by mouth See admin instructions. May repeat in 2 hours if needed    Dispense:  9 tablet    Refill:  1  . albuterol (PROAIR HFA) 108 (90 Base) MCG/ACT inhaler    Sig: INHALE 2 PUFFS INTO THE LUNGS EVERY 6 HOURS AS NEEDED FOR WHEEZING OR SHORTNESS OF BREATH    Dispense:  8.5 g    Refill:  0  . montelukast (  SINGULAIR) 10 MG tablet    Sig: Take 1 tablet (10 mg total) by mouth at bedtime.    Dispense:  30 tablet    Refill:  6  . Fluticasone-Salmeterol (ADVAIR DISKUS) 250-50 MCG/DOSE AEPB    Sig: INHALE 1 PUFF INTO THE LUNGS TWICE DAILY AS NEEDED FOR ALLERGIES OR  WHEEZING OR SHORTNESS OF BREATH    Dispense:  60 each    Refill:  5  . esomeprazole (NEXIUM) 40 MG capsule    Sig: Take 1 capsule (40 mg total) by mouth 2 (two) times daily before a meal.    Dispense:  180 capsule    Refill:  2  . emtricitabine-tenofovir (TRUVADA) 200-300 MG tablet    Sig: Take 1 tablet by mouth daily.    Dispense:  90 tablet    Refill:  2  . dicyclomine (BENTYL) 20 MG tablet    Sig: Take 1 tablet (20 mg total) by mouth every 6 (six) hours as needed.    Dispense:  180 tablet    Refill:  2   Patient Instructions   Call or send request for Ambien and xanax when due for refill. Will need to meet to discuss these meds again in 6 months.   As we have discussed, you could try taking an inhaled steroid alone (Qvar, flovent) in place of the Advair as needed. Please follow up to discuss that further if that is something you want to try. Continue singulair daily, albuterol as needed.   For migraine headaches, please follow up with headache specialist in [place], but will refill Maxalt for now.   Reed pressure is a little low today, likely from meds given this morning. Rest today, monitor your Reed pressure meds and home and coordinate meds with your cardiologist. If feeling lightheaded or dizzy, be seen in ER. Return to the clinic or go to the nearest emergency room if any of your symptoms worsen or new symptoms occur.  No change in truvada dose. I will check tests today.   I refilled the nexium and bentyl today, but keep follow up with gastroenterology.   Thanks for coming in today.    IF you received an x-Reed today, you will receive an invoice from [facility]. Please contact [facility] at [phone] with questions or concerns regarding your invoice.   IF you received labwork today, you will receive an invoice from [place]. Please contact [facility] at [phone] with questions or concerns regarding your invoice.   Our billing staff will not be  able to assist you with questions regarding bills from these companies.  You will be contacted with the lab results as soon as they are available. The fastest way to get your results is to activate your My Chart account. Instructions are located on the last page of this paperwork. If you have not heard from [place] regarding the results in 2 weeks, please contact this office.      I personally performed the services described in this documentation, which was scribed in my presence. The recorded information has been reviewed and considered for accuracy and completeness, addended by me as needed, and agree with information above.  Signed,   Randall Ray, MD [facility] [place].  04/27/18 9:54 PM

## 2018-04-25 LAB — BASIC METABOLIC PANEL
BUN / CREAT RATIO: 10 (ref 9–20)
BUN: 12 mg/dL (ref 6–24)
CHLORIDE: 100 mmol/L (ref 96–106)
CO2: 22 mmol/L (ref 20–29)
Calcium: 8.5 mg/dL — ABNORMAL LOW (ref 8.7–10.2)
Creatinine, Ser: 1.25 mg/dL (ref 0.76–1.27)
GFR calc non Af Amer: 67 mL/min/{1.73_m2} (ref >59)
GFR, EST AFRICAN AMERICAN: 78 mL/min/{1.73_m2} (ref >59)
Glucose: 94 mg/dL (ref 65–99)
POTASSIUM: 3.8 mmol/L (ref 3.5–5.2)
Sodium: 139 mmol/L (ref 134–144)

## 2018-04-25 LAB — RPR: RPR Ser Ql: NONREACTIVE

## 2018-04-25 LAB — VITAMIN D 25 HYDROXY (VIT D DEFICIENCY, FRACTURES): VIT D 25 HYDROXY: 26.3 ng/mL — AB (ref 30.0–100.0)

## 2018-04-25 LAB — HIV ANTIBODY (ROUTINE TESTING W REFLEX): HIV Screen 4th Generation wRfx: NONREACTIVE

## 2018-04-25 LAB — MAGNESIUM: MAGNESIUM: 2.2 mg/dL (ref 1.6–2.3)

## 2018-04-25 LAB — VITAMIN B12: Vitamin B-12: 343 pg/mL (ref 232–1245)

## 2018-04-28 ENCOUNTER — Ambulatory Visit (INDEPENDENT_AMBULATORY_CARE_PROVIDER_SITE_OTHER): Payer: BLUE CROSS/BLUE SHIELD | Admitting: Family Medicine

## 2018-04-28 DIAGNOSIS — R194 Change in bowel habit: Secondary | ICD-10-CM | POA: Diagnosis not present

## 2018-04-28 DIAGNOSIS — R6881 Early satiety: Secondary | ICD-10-CM | POA: Diagnosis not present

## 2018-04-28 DIAGNOSIS — Z113 Encounter for screening for infections with a predominantly sexual mode of transmission: Secondary | ICD-10-CM | POA: Diagnosis not present

## 2018-04-28 NOTE — Progress Notes (Signed)
Lab only visit 

## 2018-04-28 NOTE — Progress Notes (Signed)
Lab visit - patient not seen.

## 2018-04-29 LAB — GC/CHLAMYDIA PROBE AMP
CHLAMYDIA, DNA PROBE: NEGATIVE
NEISSERIA GONORRHOEAE BY PCR: NEGATIVE

## 2018-05-14 DIAGNOSIS — M7521 Bicipital tendinitis, right shoulder: Secondary | ICD-10-CM | POA: Diagnosis not present

## 2018-05-14 DIAGNOSIS — S46011S Strain of muscle(s) and tendon(s) of the rotator cuff of right shoulder, sequela: Secondary | ICD-10-CM | POA: Diagnosis not present

## 2018-05-14 DIAGNOSIS — Z713 Dietary counseling and surveillance: Secondary | ICD-10-CM | POA: Diagnosis not present

## 2018-05-23 ENCOUNTER — Other Ambulatory Visit: Payer: Self-pay | Admitting: Family Medicine

## 2018-05-23 DIAGNOSIS — R194 Change in bowel habit: Secondary | ICD-10-CM | POA: Diagnosis not present

## 2018-05-23 DIAGNOSIS — F5104 Psychophysiologic insomnia: Secondary | ICD-10-CM

## 2018-05-23 DIAGNOSIS — R6881 Early satiety: Secondary | ICD-10-CM | POA: Diagnosis not present

## 2018-05-23 DIAGNOSIS — D126 Benign neoplasm of colon, unspecified: Secondary | ICD-10-CM | POA: Diagnosis not present

## 2018-05-23 DIAGNOSIS — K2930 Chronic superficial gastritis without bleeding: Secondary | ICD-10-CM | POA: Diagnosis not present

## 2018-05-23 DIAGNOSIS — F411 Generalized anxiety disorder: Secondary | ICD-10-CM

## 2018-05-24 ENCOUNTER — Other Ambulatory Visit: Payer: Self-pay | Admitting: Family Medicine

## 2018-05-24 DIAGNOSIS — I10 Essential (primary) hypertension: Secondary | ICD-10-CM

## 2018-05-26 NOTE — Telephone Encounter (Signed)
Refill? Was last seen 04/28/2018

## 2018-05-26 NOTE — Telephone Encounter (Signed)
Refill?  Last script given 04/15/2018  And last office visit 04/28/2018.

## 2018-05-27 DIAGNOSIS — D126 Benign neoplasm of colon, unspecified: Secondary | ICD-10-CM | POA: Diagnosis not present

## 2018-05-27 DIAGNOSIS — K2930 Chronic superficial gastritis without bleeding: Secondary | ICD-10-CM | POA: Diagnosis not present

## 2018-05-28 NOTE — Telephone Encounter (Signed)
Refilled. See last ov.

## 2018-06-11 ENCOUNTER — Encounter: Payer: Self-pay | Admitting: Cardiovascular Disease

## 2018-06-11 NOTE — Telephone Encounter (Signed)
I spoke with pt. He reports for the last 2 weeks he has been feeling heart race when he gets up in the morning.  Feels in his chest and head. Rate 100-109. No chest pain. Occasional left side pain but he states this has been evaluated in the past and no cause for this has been determined. States heart rate will sometimes drop to 80's -90's during the day depending on activity.  States he had to be given extra medication at time of CT due to elevated heart rate.  He is concerned because family member's have history of cardiac events in the morning. Pt reports diastolic BP has been elevated.  He check BP in the morning and periodically throughout the day and diastolic is running 97-35.  He has used the same BP cuff for the past 3 years. Pt is taking amlodipine, chlorthalidone and Losartan as listed on med list.  I talked with pt about discussing erectile dysfunction with his primary care provider but pt states [name] prescribed Cialis for him.  Pt is going to talk with his endocrinologist about his testosterone levels. I told pt I would forward information to [name] and then we would call him back with recommendations.  Pt aware [name] is not in the office this week

## 2018-06-17 ENCOUNTER — Other Ambulatory Visit: Payer: Self-pay | Admitting: Family Medicine

## 2018-06-17 DIAGNOSIS — G43109 Migraine with aura, not intractable, without status migrainosus: Secondary | ICD-10-CM

## 2018-06-17 DIAGNOSIS — F411 Generalized anxiety disorder: Secondary | ICD-10-CM

## 2018-06-17 DIAGNOSIS — F5104 Psychophysiologic insomnia: Secondary | ICD-10-CM

## 2018-06-17 NOTE — Telephone Encounter (Signed)
zolpidem refill Last Refill:05/28/18 # 30 tab  Last OV: 04/24/18 PCP: Dr. Carlota Raspberry Pharmacy:[id number]  alprazolam refill Last Refill:05/28/18 # 60 Last OV: 04/24/18

## 2018-06-17 NOTE — Telephone Encounter (Signed)
Maxalt refill Last Refill:05/23/18 # 9 Last OV: 04/24/18 PCP: Carlota Raspberry Pharmacy:verified

## 2018-06-18 NOTE — Telephone Encounter (Signed)
These were refilled 7/24. Is he out or just requesting refill for the 24th of this month?  Thanks.

## 2018-06-18 NOTE — Telephone Encounter (Signed)
Patient called and asked about the refill of Xanax and Ambien. He says the drug store told him to call. He says he is going out of town Friday night through [place] and he will run out before he returns home. He is wanting a refill early of the medication and says the pharmacy will be calling his insurance company to get approval as well when they receive the refill request. He says to tell Dr. Carlota Raspberry thank you very much.

## 2018-06-19 ENCOUNTER — Telehealth: Payer: Self-pay | Admitting: Family Medicine

## 2018-06-19 MED ORDER — SILDENAFIL CITRATE 100 MG PO TABS: 100.0000 mg | ORAL_TABLET | Freq: Every day | ORAL | 1 refills | Status: DC | PRN

## 2018-06-19 MED ORDER — METOPROLOL SUCCINATE ER 25 MG PO TB24: 25.0000 mg | ORAL_TABLET | Freq: Every day | ORAL | 11 refills | Status: DC

## 2018-06-19 NOTE — Telephone Encounter (Signed)
Refill? Controlled 

## 2018-06-19 NOTE — Telephone Encounter (Unsigned)
Copied from [place] [id number]. Topic: Quick Communication - See Telephone Encounter >> Jun 19, 2018  6:16 PM Neva Seat wrote: ALPRAZolam [name]) 1 MG tablet zolpidem (AMBIEN) 10 MG tablet  Pt called back to speak with someone on the status of the "early request" refill of the above medications.  Pt is going out of town/traveling and needing an early refill. Pt went to the [facility] and it wasn't called in or ready for pick up.  Please call pt back to let him know if the early refill was approved and when he can pick the Rx's up.

## 2018-06-19 NOTE — Telephone Encounter (Signed)
Called patient to discuss ED medication options.  After much discussion, he wishes to have sildenafil 100mg  tablets #30 sent to [facility]. He understands to start with half a tablet. He knows to stop Cialis. He will send a MyChart message tomorrow if he wants to explore other options.

## 2018-06-20 NOTE — Telephone Encounter (Signed)
Refilled for early refill given situation described.

## 2018-06-20 NOTE — Telephone Encounter (Signed)
Message sent to Dr. Carlota Raspberry re: early refill of Stacie Glaze, Ambien

## 2018-06-20 NOTE — Telephone Encounter (Signed)
Refilled

## 2018-07-11 ENCOUNTER — Other Ambulatory Visit: Payer: Self-pay | Admitting: Cardiovascular Disease

## 2018-07-17 ENCOUNTER — Other Ambulatory Visit: Payer: Self-pay | Admitting: Family Medicine

## 2018-07-17 ENCOUNTER — Encounter: Payer: Self-pay | Admitting: Family Medicine

## 2018-07-17 NOTE — Telephone Encounter (Signed)
Refill of Truvada  LRF 04/24/18  #90  2 refills  LOV 04/28/18  Dr. Vic Blackbird - [place], [place] - [address] AT [facility]

## 2018-07-21 DIAGNOSIS — Z713 Dietary counseling and surveillance: Secondary | ICD-10-CM | POA: Diagnosis not present

## 2018-07-21 DIAGNOSIS — J4521 Mild intermittent asthma with (acute) exacerbation: Secondary | ICD-10-CM | POA: Diagnosis not present

## 2018-07-21 DIAGNOSIS — J0191 Acute recurrent sinusitis, unspecified: Secondary | ICD-10-CM | POA: Diagnosis not present

## 2018-07-21 DIAGNOSIS — H9201 Otalgia, right ear: Secondary | ICD-10-CM | POA: Diagnosis not present

## 2018-07-25 DIAGNOSIS — E230 Hypopituitarism: Secondary | ICD-10-CM | POA: Diagnosis not present

## 2018-07-25 DIAGNOSIS — Z5181 Encounter for therapeutic drug level monitoring: Secondary | ICD-10-CM | POA: Diagnosis not present

## 2018-07-28 ENCOUNTER — Other Ambulatory Visit: Payer: Self-pay | Admitting: Family Medicine

## 2018-07-28 DIAGNOSIS — F411 Generalized anxiety disorder: Secondary | ICD-10-CM

## 2018-07-28 DIAGNOSIS — F5104 Psychophysiologic insomnia: Secondary | ICD-10-CM

## 2018-07-28 DIAGNOSIS — G43109 Migraine with aura, not intractable, without status migrainosus: Secondary | ICD-10-CM

## 2018-07-29 NOTE — Telephone Encounter (Signed)
Xanax refill Last Refill:06/20/18 # 60 Last OV: 04/28/18 PCP: Dr. Carlota Raspberry Pharmacy:[facility].  Ambien refill Last Refill:06/20/18 # 30 with 0 refill Last OV: 04/28/18 PCP: Dr. Carlota Raspberry Pharmacy:[facility].  Maxalt refill Last Refill:06/20/18 # 9 with 0 refill Last OV: 04/28/18 PCP: Dr. Carlota Raspberry Pharmacy:[facility]. [address]

## 2018-07-29 NOTE — Telephone Encounter (Signed)
Please advise on controlled meds.   Per note from 04/24/2018  "Migraine with aura and without status migrainosus, not intractable - Plan: rizatriptan (MAXALT-MLT) 10 MG disintegrating tablet             -Stable with intermittent Maxalt dosing.  With still persistent need for Maxalt, recommended follow-up with headache specialist in [place].  He plans on setting up that appointment."  Also  Call or send request for Ambien and xanax when due for refill. Will need to meet to discuss these meds again in 6 months.

## 2018-07-30 NOTE — Telephone Encounter (Signed)
Meds refilled, has office visit planned for December 13.

## 2018-08-01 DIAGNOSIS — R197 Diarrhea, unspecified: Secondary | ICD-10-CM | POA: Diagnosis not present

## 2018-08-07 ENCOUNTER — Other Ambulatory Visit: Payer: Self-pay | Admitting: Family Medicine

## 2018-08-07 DIAGNOSIS — K644 Residual hemorrhoidal skin tags: Secondary | ICD-10-CM

## 2018-08-07 DIAGNOSIS — E876 Hypokalemia: Secondary | ICD-10-CM | POA: Diagnosis not present

## 2018-08-07 NOTE — Telephone Encounter (Signed)
Copied from [place] [id number]. Topic: Quick Communication - Rx Refill/Question >> Aug 07, 2018  8:16 AM Leward Quan A wrote: Medication: PROCTOZONE-HC 2.5 % rectal cream  Patient states that this is day 21 of Diarrhea and is very uncomfortable request Rx sent to pharmacy as soon as possible.  Has the patient contacted their pharmacy?  Yes   Preferred Pharmacy (with phone number or street name): [facility], [place] AT [place] [phone] (Phone) [phone] (Fax)    Agent: Please be advised that RX refills may take up to 3 business days. We ask that you follow-up with your pharmacy.

## 2018-08-07 NOTE — Telephone Encounter (Signed)
Requested medication (s) are due for refill today: yes  Requested medication (s) are on the active medication list: yes  Last refill:  08/2017  Future visit scheduled: yes  Notes to clinic: unable to fill per protocol     Requested Prescriptions  Pending Prescriptions Disp Refills   hydrocortisone (PROCTOZONE-HC) 2.5 % rectal cream 30 g 0    Sig: USE RECTALLY TWICE DAILY     Off-Protocol Failed - 08/07/2018  8:28 AM      Failed - Medication not assigned to a protocol, review manually.      Passed - Valid encounter within last 12 months    Recent Outpatient Visits          3 months ago Routine screening for STI (sexually transmitted infection)   [facility], Ranell Patrick, MD   3 months ago Hypokalemia   [facility], Ranell Patrick, MD   4 months ago Abdominal pain, epigastric   [facility], Ranell Patrick, MD   7 months ago Dysphagia, pharyngoesophageal phase   [facility] [place], PA-C   10 months ago GAD (generalized anxiety disorder)   [facility], Ranell Patrick, MD      Future Appointments            In 2 months Carlota Raspberry Ranell Patrick, MD [facility] at [place], [facility]

## 2018-08-15 ENCOUNTER — Other Ambulatory Visit: Payer: Self-pay | Admitting: Cardiovascular Disease

## 2018-08-15 NOTE — Telephone Encounter (Signed)
Okay to refill? Please advise. Thanks, MI 

## 2018-08-25 ENCOUNTER — Other Ambulatory Visit: Payer: Self-pay | Admitting: Family Medicine

## 2018-08-25 DIAGNOSIS — F411 Generalized anxiety disorder: Secondary | ICD-10-CM

## 2018-08-25 DIAGNOSIS — F5104 Psychophysiologic insomnia: Secondary | ICD-10-CM

## 2018-08-26 NOTE — Telephone Encounter (Signed)
Requested medication (s) are due for refill today: yes  Requested medication (s) are on the active medication list: yes  Last refill:  07/30/18  Future visit scheduled: yes  10/17/18  Notes to clinic:      Requested Prescriptions  Pending Prescriptions Disp Refills   ALPRAZolam (XANAX) 1 MG tablet [Pharmacy Med Name: ALPRAZOLAM 1MG  TABLETS] 60 tablet 0    Sig: TAKE 1 TABLET BY MOUTH TWICE DAILY AS NEEDED     Not Delegated - Psychiatry:  Anxiolytics/Hypnotics Failed - 08/25/2018 10:24 PM      Failed - This refill cannot be delegated      Failed - Urine Drug Screen completed in last 360 days.      Passed - Valid encounter within last 6 months    Recent Outpatient Visits          4 months ago Routine screening for STI (sexually transmitted infection)   [facility], Ranell Patrick, MD   4 months ago Hypokalemia   [facility], Ranell Patrick, MD   4 months ago Abdominal pain, epigastric   [facility], Ranell Patrick, MD   8 months ago Dysphagia, pharyngoesophageal phase   [facility], Audrie Lia, PA-C   11 months ago GAD (generalized anxiety disorder)   [facility], Ranell Patrick, MD      Future Appointments            In 1 month Carlota Raspberry Ranell Patrick, MD [facility] at [place], [place]          zolpidem (AMBIEN) 10 MG tablet [Pharmacy Med Name: ZOLPIDEM 10MG  TABLETS] 30 tablet 0    Sig: TAKE 1 TABLET BY MOUTH AT BEDTIME     Not Delegated - Psychiatry:  Anxiolytics/Hypnotics Failed - 08/25/2018 10:24 PM      Failed - This refill cannot be delegated      Failed - Urine Drug Screen completed in last 360 days.      Passed - Valid encounter within last 6 months    Recent Outpatient Visits          4 months ago Routine screening for STI (sexually transmitted infection)   [facility], Ranell Patrick, MD   4 months ago Hypokalemia   [facility], Ranell Patrick, MD   4 months ago  Abdominal pain, epigastric   [facility], Ranell Patrick, MD   8 months ago Dysphagia, pharyngoesophageal phase   [facility] [place], PA-C   11 months ago GAD (generalized anxiety disorder)   [facility], Ranell Patrick, MD      Future Appointments            In 1 month Carlota Raspberry Ranell Patrick, MD [facility] at [place], [facility]

## 2018-08-28 NOTE — Telephone Encounter (Signed)
Refilled

## 2018-08-29 DIAGNOSIS — R197 Diarrhea, unspecified: Secondary | ICD-10-CM | POA: Diagnosis not present

## 2018-08-29 DIAGNOSIS — R7989 Other specified abnormal findings of blood chemistry: Secondary | ICD-10-CM | POA: Diagnosis not present

## 2018-08-29 DIAGNOSIS — R7301 Impaired fasting glucose: Secondary | ICD-10-CM | POA: Diagnosis not present

## 2018-08-29 DIAGNOSIS — E230 Hypopituitarism: Secondary | ICD-10-CM | POA: Diagnosis not present

## 2018-09-05 ENCOUNTER — Other Ambulatory Visit: Payer: Self-pay | Admitting: Family Medicine

## 2018-09-05 DIAGNOSIS — E669 Obesity, unspecified: Secondary | ICD-10-CM | POA: Diagnosis not present

## 2018-09-05 DIAGNOSIS — E230 Hypopituitarism: Secondary | ICD-10-CM | POA: Diagnosis not present

## 2018-09-05 DIAGNOSIS — Z79899 Other long term (current) drug therapy: Secondary | ICD-10-CM | POA: Diagnosis not present

## 2018-09-05 DIAGNOSIS — Z125 Encounter for screening for malignant neoplasm of prostate: Secondary | ICD-10-CM | POA: Diagnosis not present

## 2018-09-05 DIAGNOSIS — G43109 Migraine with aura, not intractable, without status migrainosus: Secondary | ICD-10-CM

## 2018-09-21 ENCOUNTER — Other Ambulatory Visit: Payer: Self-pay | Admitting: Family Medicine

## 2018-09-21 DIAGNOSIS — F411 Generalized anxiety disorder: Secondary | ICD-10-CM

## 2018-09-21 DIAGNOSIS — F5104 Psychophysiologic insomnia: Secondary | ICD-10-CM

## 2018-09-23 NOTE — Telephone Encounter (Signed)
Requested medication (s) are due for refill today: zolpidem yes  Requested medication (s) are on the active medication list: yes  Last refill:  08/28/18  Future visit scheduled: yes  Notes to clinic:  Not delegated  Requested medication (s) are due for refill today: alprazolam yes  Requested medication (s) are on the active medication list: yes  Last refill:  08/28/18  Future visit scheduled: yes  Notes to clinic:  Not delegated   Requested Prescriptions  Pending Prescriptions Disp Refills   zolpidem (AMBIEN) 10 MG tablet [Pharmacy Med Name: ZOLPIDEM 10MG  TABLETS] 30 tablet 0    Sig: TAKE 1 TABLET BY MOUTH AT BEDTIME     Not Delegated - Psychiatry:  Anxiolytics/Hypnotics Failed - 09/21/2018 11:37 PM      Failed - This refill cannot be delegated      Failed - Urine Drug Screen completed in last 360 days.      Passed - Valid encounter within last 6 months    Recent Outpatient Visits          4 months ago Routine screening for STI (sexually transmitted infection)   [facility], Randall Patrick, MD   5 months ago Hypokalemia   [facility], Randall Patrick, MD   5 months ago Abdominal pain, epigastric   [facility], Randall Patrick, MD   9 months ago Dysphagia, pharyngoesophageal phase   [facility] [place], PA-C   12 months ago GAD (generalized anxiety disorder)   [facility], Randall Patrick, MD      Future Appointments            In 3 weeks Randall Reed Randall Patrick, MD [facility] at [place], [place]          ALPRAZolam [name]) 1 MG tablet [Pharmacy Med Name: ALPRAZOLAM 1MG  TABLETS] 60 tablet 0    Sig: TAKE 1 TABLET BY MOUTH TWICE DAILY AS NEEDED     Not Delegated - Psychiatry:  Anxiolytics/Hypnotics Failed - 09/21/2018 11:37 PM      Failed - This refill cannot be delegated      Failed - Urine Drug Screen completed in last 360 days.      Passed - Valid encounter within last 6 months    Recent  Outpatient Visits          4 months ago Routine screening for STI (sexually transmitted infection)   [facility], Randall Patrick, MD   5 months ago Hypokalemia   [facility], Randall Patrick, MD   5 months ago Abdominal pain, epigastric   [facility], Randall Patrick, MD   9 months ago Dysphagia, pharyngoesophageal phase   [facility] at [place], PA-C   12 months ago GAD (generalized anxiety disorder)   [facility], Randall Patrick, MD      Future Appointments            In 3 weeks Randall Reed Randall Patrick, MD [facility] at [place], [facility]

## 2018-09-28 ENCOUNTER — Other Ambulatory Visit: Payer: Self-pay | Admitting: Family Medicine

## 2018-09-28 DIAGNOSIS — G43109 Migraine with aura, not intractable, without status migrainosus: Secondary | ICD-10-CM

## 2018-10-01 DIAGNOSIS — R1012 Left upper quadrant pain: Secondary | ICD-10-CM | POA: Diagnosis not present

## 2018-10-09 ENCOUNTER — Other Ambulatory Visit: Payer: Self-pay | Admitting: Family Medicine

## 2018-10-09 DIAGNOSIS — K644 Residual hemorrhoidal skin tags: Secondary | ICD-10-CM

## 2018-10-10 NOTE — Telephone Encounter (Signed)
Requested medication (s) are due for refill today: Yes  Requested medication (s) are on the active medication list: Yes  Last refill:  01/05/18  Future visit scheduled: Yes  Notes to clinic:  See request    Requested Prescriptions  Pending Prescriptions Disp Refills   PROCTOZONE-HC 2.5 % rectal cream [Pharmacy Med Name: PROCTOZONE-HC 2.5% CREAM] 30 g 0    Sig: USE RECTALLY TWICE DAILY     Off-Protocol Failed - 10/09/2018 10:35 PM      Failed - Medication not assigned to a protocol, review manually.      Passed - Valid encounter within last 12 months    Recent Outpatient Visits          5 months ago Routine screening for STI (sexually transmitted infection)   [facility], Ranell Patrick, MD   5 months ago Hypokalemia   [facility], Ranell Patrick, MD   6 months ago Abdominal pain, epigastric   [facility], Ranell Patrick, MD   9 months ago Dysphagia, pharyngoesophageal phase   [facility] [place], PA-C   1 year ago GAD (generalized anxiety disorder)   [facility], Ranell Patrick, MD      Future Appointments            In 1 week Carlota Raspberry Ranell Patrick, MD [facility] at [place], [facility]

## 2018-10-16 ENCOUNTER — Other Ambulatory Visit: Payer: Self-pay | Admitting: Family Medicine

## 2018-10-16 ENCOUNTER — Other Ambulatory Visit: Payer: Self-pay | Admitting: Cardiovascular Disease

## 2018-10-17 ENCOUNTER — Encounter: Payer: Self-pay | Admitting: Family Medicine

## 2018-10-17 ENCOUNTER — Ambulatory Visit: Payer: Self-pay | Admitting: Surgery

## 2018-10-17 ENCOUNTER — Other Ambulatory Visit: Payer: Self-pay

## 2018-10-17 ENCOUNTER — Ambulatory Visit: Payer: BLUE CROSS/BLUE SHIELD | Admitting: Family Medicine

## 2018-10-17 VITALS — BP 105/72 | HR 93 | Temp 98.4°F | Resp 16 | Ht 71.0 in | Wt 274.6 lb

## 2018-10-17 DIAGNOSIS — J309 Allergic rhinitis, unspecified: Secondary | ICD-10-CM

## 2018-10-17 DIAGNOSIS — J45909 Unspecified asthma, uncomplicated: Secondary | ICD-10-CM

## 2018-10-17 DIAGNOSIS — Z113 Encounter for screening for infections with a predominantly sexual mode of transmission: Secondary | ICD-10-CM | POA: Diagnosis not present

## 2018-10-17 DIAGNOSIS — G43109 Migraine with aura, not intractable, without status migrainosus: Secondary | ICD-10-CM

## 2018-10-17 DIAGNOSIS — Z79899 Other long term (current) drug therapy: Secondary | ICD-10-CM

## 2018-10-17 DIAGNOSIS — F411 Generalized anxiety disorder: Secondary | ICD-10-CM

## 2018-10-17 DIAGNOSIS — F5104 Psychophysiologic insomnia: Secondary | ICD-10-CM

## 2018-10-17 MED ORDER — FLUTICASONE PROPIONATE 50 MCG/ACT NA SUSP: NASAL | 6 refills | Status: DC

## 2018-10-17 MED ORDER — ALBUTEROL SULFATE HFA 108 (90 BASE) MCG/ACT IN AERS: INHALATION_SPRAY | RESPIRATORY_TRACT | 0 refills | Status: DC

## 2018-10-17 MED ORDER — EMTRICITABINE-TENOFOVIR DF 200-300 MG PO TABS: 1.0000 | ORAL_TABLET | Freq: Every day | ORAL | 2 refills | Status: DC

## 2018-10-17 MED ORDER — MONTELUKAST SODIUM 10 MG PO TABS: 10.0000 mg | ORAL_TABLET | Freq: Every day | ORAL | 2 refills | Status: DC

## 2018-10-17 MED ORDER — IPRATROPIUM BROMIDE 0.06 % NA SOLN: 2.0000 | Freq: Two times a day (BID) | NASAL | 9 refills | Status: DC

## 2018-10-17 MED ORDER — RIZATRIPTAN BENZOATE 10 MG PO TBDP: ORAL_TABLET | ORAL | 2 refills | Status: DC

## 2018-10-17 MED ORDER — FLUTICASONE-SALMETEROL 250-50 MCG/DOSE IN AEPB: INHALATION_SPRAY | RESPIRATORY_TRACT | 5 refills | Status: DC

## 2018-10-17 NOTE — Progress Notes (Signed)
Subjective:    Patient ID: Randall Reed., male    DOB: 1971-03-20, 47 y.o.   MRN: [id number]  HPI Randall Reed is a 47 y.o. male Presents today for: Chief Complaint  Patient presents with  . Anxiety    medication refill for xanax, maxalt  . Sore Throat    have a scratch throat x1 day think it may be due to allergy but not sure would like this looked at  . Medication Refill    refill on truvada  . Allergic Rhinitis     flonase, albuterol inhaler advair, atrovent singular  . Insomnia    refill on ambien   Here for refill on all chronic medications as well as acute issues as above. Last seen in June.   Generalized anxiety with insomnia.: Has tried various SSRIs in the past with intolerance.  He takes Xanax 1 mg 1 to 2 tablets/day with Ambien at bedtime if needed. Had previously been on Xanax 3 times daily.  Denies any extra uses, not prescribed elsewhere, no new side effects or intolerances. Last Xanax No. 60 on 09/25/2018  Last Ambien No. 30 on 09/25/2018 - taking nightly. No new parasomnias.  Occasionally may need to take up to 3 xanaxper day, but other days 0-1 pill.  2 per month has worked out.   Hypertension: BP Readings from Last 3 Encounters:  10/17/18 105/72  04/24/18 96/64  04/24/18 (!) 92/54   Lab Results  Component Value Date   CREATININE 1.25 04/24/2018  Followed by cardiologist, [name].  Takes Norvasc, chlorthalidone, aspirin 81 mg, Losartan, Toprol.  Followed by endocrinology for hypogonadism.   Asthma with allergic rhinitis: Has used Advair as needed in the past, and singular 10 mg daily.  Rare use of Advair previously, albuterol as needed for exercise.  Flonase and Atrovent nasal spray for allergies. advair few times per month Albuterol few times earlier this week.  Usually 1-2 uses per week. Smoke/perfume triggers and prevention with exercise.  Slight scratchy throat, PND. No fevers. Min cough. No new HA/bodyache.   Migraine  headaches: Has used Maxalt as needed.  Migraine with aura.  Recommended follow-up with neurologist in [place] in [place] where he spends most of his time. maxalt 2-3 per month  GERD with history of IBS. Has been followed by [facility] gastroenterology.  Treated with Nexium and Bentyl previously. followed by [name]. Treated with some antibiotic, but recurrent diarrhea improved.   Lipoma removals planned by Dr. Georgette Dover on December 26  Preexposure prophylaxis for HIV Takes Truvada 200/300 mg daily.  STI testing and renal function testing was normal in June.  No new sexual contacts, but unprotected intercourse x1 since last testing.  No fever/flu like illness.  No penile d/c or rash/lesions.   Results for orders placed or performed in visit on 04/24/18  GC/Chlamydia Probe Amp  Result Value Ref Range   Chlamydia trachomatis, NAA Negative Negative   Neisseria gonorrhoeae by PCR Negative Negative  Vitamin B12  Result Value Ref Range   Vitamin B-12 343 232 - 1,245 pg/mL  Vitamin D, 25-hydroxy  Result Value Ref Range   Vit D, 25-Hydroxy 26.3 (L) 30.0 - 100.0 ng/mL  Magnesium  Result Value Ref Range   Magnesium 2.2 1.6 - 2.3 mg/dL  Basic metabolic panel  Result Value Ref Range   Glucose 94 65 - 99 mg/dL   BUN 12 6 - 24 mg/dL   Creatinine, Ser 1.25 0.76 - 1.27 mg/dL  GFR calc non Af Amer 67 >59 mL/min/1.73   GFR calc Af Amer 78 >59 mL/min/1.73   BUN/Creatinine Ratio 10 9 - 20   Sodium 139 134 - 144 mmol/L   Potassium 3.8 3.5 - 5.2 mmol/L   Chloride 100 96 - 106 mmol/L   CO2 22 20 - 29 mmol/L   Calcium 8.5 (L) 8.7 - 10.2 mg/dL  RPR  Result Value Ref Range   RPR Ser Ql Non Reactive Non Reactive  HIV antibody  Result Value Ref Range   HIV Screen 4th Generation wRfx Non Reactive Non Reactive      Patient Active Problem List   Diagnosis Date Noted  . Irritable bowel syndrome 03/14/2016  . Asthma 11/19/2015  . 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 rotator  cuff tear 10/17/2013  . Hypogonadism male 09/04/2013  . ED (erectile dysfunction) 07/30/2013  . Palpitations 02/21/2012  . BMI 33.0-33.9,adult 02/04/2012  . Allergic rhinitis 02/04/2012  . GERD (gastroesophageal reflux disease) 02/04/2012  . Migraine 02/04/2012  . GAD (generalized anxiety disorder) 02/04/2012  . Insomnia 02/04/2012  . HYPERTENSION, BENIGN 04/13/2009  . CHEST PAIN-UNSPECIFIED 04/13/2009   Past Medical History:  Diagnosis Date  . Anxiety   . Arthritis   . Asthma   . Depression   . GERD (gastroesophageal reflux disease)   . Seasonal allergic reaction    Past Surgical History:  Procedure Laterality Date  . none    . none     Allergies  Allergen Reactions  . Desloratadine Other (See Comments)    CLARINEX-"severe headache"  . Hydrocod Polst-Cpm Polst Er Itching  . Loratadine Other (See Comments)    "severe headache"  . Tussionex Pennkinetic Er [Hydrocod Polst-Cpm Polst Er] Itching  . Levbid [Hyoscyamine Sulfate] Rash  . Telithromycin Rash   Prior to Admission medications   Medication Sig Start Date End Date Taking? Authorizing Provider  acetic acid-hydrocortisone (VOSOL-HC) otic solution Place 3 drops into both ears 3 (three) times daily. Use as needed to prevent swimmer's ear 01/10/15  Yes Leandrew Koyanagi, MD  albuterol [facility] HFA) 108 (90 Base) MCG/ACT inhaler INHALE 2 PUFFS INTO THE LUNGS EVERY 6 HOURS AS NEEDED FOR WHEEZING OR SHORTNESS OF BREATH 04/24/18  Yes [name], MD  ALPRAZolam [name]) 1 MG tablet TAKE 1 TABLET BY MOUTH TWICE DAILY AS NEEDED 09/25/18  Yes [name], MD  amLODipine (NORVASC) 5 MG tablet Take 1 tablet (5 mg total) by mouth daily. 04/15/18 04/10/19 Yes Sherren Mocha, MD  aspirin EC 81 MG tablet Take 1 tablet (81 mg total) by mouth daily. 03/07/15  Yes Sherren Mocha, MD  chlorthalidone (HYGROTON) 25 MG tablet TAKE 1 TABLET(25 MG) BY MOUTH DAILY 04/15/18  Yes Sherren Mocha, MD  dicyclomine (BENTYL) 20 MG tablet Take 1  tablet (20 mg total) by mouth every 6 (six) hours as needed. 04/24/18  Yes [name], MD  emtricitabine-tenofovir (TRUVADA) 200-300 MG tablet Take 1 tablet by mouth daily. 04/24/18  Yes [name], MD  esomeprazole (NEXIUM) 40 MG capsule Take 1 capsule (40 mg total) by mouth 2 (two) times daily before a meal. 04/24/18  Yes [name], MD  Fexofenadine HCl (ALLEGRA PO) Take 1 tablet by mouth daily.    Yes [provider]  fluticasone (FLONASE) 50 MCG/ACT nasal spray INSTILL 2 SPRAYS IN EACH NOSTRIL DAILY 09/27/17  Yes [name], MD  Fluticasone-Salmeterol (ADVAIR DISKUS) 250-50 MCG/DOSE AEPB INHALE 1 PUFF INTO THE LUNGS TWICE DAILY  AS NEEDED FOR ALLERGIES OR WHEEZING OR SHORTNESS OF BREATH 04/24/18  Yes [name], MD  ipratropium (ATROVENT HFA) 17 MCG/ACT inhaler INHALE 2 PUFFS INTO THE LUNGS TWICE DAILY OR AS DIRECTED 04/20/16  Yes Leandrew Koyanagi, MD  ipratropium (ATROVENT) 0.06 % nasal spray Place 2 sprays into the nose 2 (two) times daily. 09/27/17  Yes [name], MD  losartan (COZAAR) 100 MG tablet TAKE 1 TABLET(100 MG) BY MOUTH DAILY 01/21/18  Yes Sherren Mocha, MD  metFORMIN (GLUCOPHAGE-XR) 500 MG 24 hr tablet Take 500 mg by mouth 4 (four) times daily.   Yes [provider]  metoprolol succinate (TOPROL XL) 25 MG 24 hr tablet Take 1 tablet (25 mg total) by mouth at bedtime. 06/19/18 06/14/19 Yes Sherren Mocha, MD  montelukast (SINGULAIR) 10 MG tablet Take 1 tablet (10 mg total) by mouth at bedtime. 04/24/18  Yes [name], MD  PROCTOZONE-HC 2.5 % rectal cream USE RECTALLY TWICE DAILY 02/06/17  Yes [name], MD  PROCTOZONE-HC 2.5 % rectal cream USE RECTALLY TWICE DAILY 10/13/18  Yes [name], MD  rizatriptan (MAXALT-MLT) 10 MG disintegrating tablet TAKE 1 TABLET BY MOUTH AS DIRECTED. MAY REPEAT IN 2 HOURS IF NEEDED 09/29/18  Yes [name], MD  sildenafil (VIAGRA) 100 MG tablet TAKE 1 TABLET BY MOUTH ONCE  DAILY AS NEEDED FOR  ERECTILE  DYSFUNCTION 10/17/18  Yes Sherren Mocha, MD  testosterone cypionate (DEPOTESTOTERONE CYPIONATE) 100 MG/ML injection Inject 200 mg into the muscle every 14 (fourteen) days. For IM use only   Yes [provider]  TRUVADA 200-300 MG tablet TAKE 1 TABLET BY MOUTH DAILY 07/22/18  Yes [name], MD  Vitamin D, Ergocalciferol, (DRISDOL) 50000 units CAPS capsule Take 50,000 Units by mouth every 7 (seven) days.   Yes [provider]  zolpidem (AMBIEN) 10 MG tablet TAKE 1 TABLET BY MOUTH AT BEDTIME 09/25/18  Yes [name], MD   Social History   Socioeconomic History  . Marital status: Single    Spouse name: Not on file  . Number of children: 0  . Years of education: Not on file  . Highest education level: Not on file  Occupational History  . 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 Financial resource strain: Not on file  . Food insecurity:    Worry: Not on file    Inability: Not on file  . Transportation needs:    Medical: Not on file    Non-medical: Not on file  Tobacco Use  . Smoking status: Never Smoker  . Smokeless tobacco: Never Used  Substance and Sexual Activity  . Alcohol use: Yes    Alcohol/week: 3.0 standard drinks    Types: 3 Standard drinks or equivalent per week  . Drug use: No  . Sexual activity: Yes  Lifestyle  . Physical activity:    Days per week: Not on file    Minutes per session: Not on file  . Stress: Not on file  Relationships  . Social connections:    Talks on phone: Not on file    Gets together: Not on file    Attends religious service: Not on file    Active member of club or organization: Not on file    Attends meetings of clubs or organizations: Not on file    Relationship status: Not on file  . Intimate partner violence:    Fear of current or ex partner: Not on  file    Emotionally abused: Not on file    Physically abused: Not on file    Forced sexual  activity: Not on file  Other Topics Concern  . Not on file  Social History Narrative  . Not on file    Review of Systems Per HPI     Objective:   Physical Exam Vitals signs reviewed.  Constitutional:      Appearance: He is well-developed.  HENT:     Head: Normocephalic and atraumatic.     Right Ear: Tympanic membrane, ear canal and external ear normal.     Left Ear: Tympanic membrane, ear canal and external ear normal.     Nose: No rhinorrhea.     Mouth/Throat:     Pharynx: No oropharyngeal exudate or posterior oropharyngeal erythema.  Eyes:     Conjunctiva/sclera: Conjunctivae normal.     Pupils: Pupils are equal, round, and reactive to light.  Neck:     Musculoskeletal: Neck supple.  Cardiovascular:     Rate and Rhythm: Normal rate and regular rhythm.     Heart sounds: Normal heart sounds. No murmur.  Pulmonary:     Effort: Pulmonary effort is normal.     Breath sounds: Normal breath sounds. No wheezing, rhonchi or rales.  Abdominal:     Palpations: Abdomen is soft.     Tenderness: There is no abdominal tenderness.  Lymphadenopathy:     Cervical: No cervical adenopathy.  Skin:    General: Skin is warm and dry.     Findings: No rash.  Neurological:     Mental Status: He is alert and oriented to person, place, and time.  Psychiatric:        Behavior: Behavior normal.    Vitals:   10/17/18 1438  BP: 105/72  Pulse: 93  Resp: 16  Temp: 98.4 F (36.9 C)  TempSrc: Oral  SpO2: 97%  Weight: 274 lb 9.6 oz (124.6 kg)  Height: 5\' 11"  (1.803 m)       Assessment & Plan:   Randall Reed. is a 47 y.o. male On pre-exposure prophylaxis for HIV - Plan: Basic metabolic panel Routine screening for STI (sexually transmitted infection) - Plan: emtricitabine-tenofovir (TRUVADA) 200-300 MG tablet, RPR, HIV Antibody (routine testing w rflx), GC/Chlamydia Probe Amp  -STI screen as above.  Monitor renal function with use of Truvada, continue same dose.  Allergic  rhinitis Allergic rhinitis, unspecified seasonality, unspecified trigger - Plan: ipratropium (ATROVENT) 0.06 % nasal spray, montelukast (SINGULAIR) 10 MG tablet, fluticasone (FLONASE) 50 MCG/ACT nasal spray  -Continue Flonase or Atrovent nasal spray, Singulair.  Overall stable.  May have some mild symptoms currently versus early upper respiratory infection, symptomatic care and RTC precautions given  Uncomplicated asthma, unspecified asthma severity, unspecified whether persistent - Plan: albuterol (PROAIR HFA) 108 (90 Base) MCG/ACT inhaler, Fluticasone-Salmeterol (ADVAIR DISKUS) 250-50 MCG/DOSE AEPB, montelukast (SINGULAIR) 10 MG tablet  -Stable with as needed use of Advair.  We have discussed individual inhaled corticosteroid but has been overall stable on current regimen.    -Continue Advair as needed, but did recommend using Advair on days when he knows he will be exposed to smoke or perfumes/colognes as those are triggers.   -Continue albuterol as needed and RTC precautions if more frequent albuterol use  Migraine with aura and without status migrainosus, not intractable - Plan: rizatriptan (MAXALT-MLT) 10 MG disintegrating tablet  -Again recommended follow-up with headache specialist given still some need for Maxalt.  Plans to look into  provider locally in [place] and let me know.  Maxalt refilled for now.  Generalized anxiety disorder Psychophysiological insomnia  -Intolerant to multiple SSRIs previously.  Has been able to cut back to overall twice per day Xanax use, some days not needing at all or 1, other days 3.  60 tablets/month has been overall stable.  Denies new side effects/intolerances.  Insomnia stable with use of Ambien, denies parasomnias.  Meds ordered this encounter  Medications  . albuterol (PROAIR HFA) 108 (90 Base) MCG/ACT inhaler    Sig: INHALE 2 PUFFS INTO THE LUNGS EVERY 6 HOURS AS NEEDED FOR WHEEZING OR SHORTNESS OF BREATH    Dispense:  8.5 g    Refill:  0  .  emtricitabine-tenofovir (TRUVADA) 200-300 MG tablet    Sig: Take 1 tablet by mouth daily.    Dispense:  90 tablet    Refill:  2  . Fluticasone-Salmeterol (ADVAIR DISKUS) 250-50 MCG/DOSE AEPB    Sig: INHALE 1 PUFF INTO THE LUNGS TWICE DAILY AS NEEDED FOR ALLERGIES OR WHEEZING OR SHORTNESS OF BREATH    Dispense:  60 each    Refill:  5  . ipratropium (ATROVENT) 0.06 % nasal spray    Sig: Place 2 sprays into the nose 2 (two) times daily.    Dispense:  15 mL    Refill:  9  . montelukast (SINGULAIR) 10 MG tablet    Sig: Take 1 tablet (10 mg total) by mouth at bedtime.    Dispense:  90 tablet    Refill:  2  . rizatriptan (MAXALT-MLT) 10 MG disintegrating tablet    Sig: TAKE 1 TABLET BY MOUTH AS DIRECTED. MAY REPEAT IN 2 HOURS IF NEEDED    Dispense:  9 tablet    Refill:  2  . fluticasone (FLONASE) 50 MCG/ACT nasal spray    Sig: INSTILL 2 SPRAYS IN EACH NOSTRIL DAILY    Dispense:  16 g    Refill:  6   Patient Instructions   If you know you will be around perfume or smoke - use advair that day.  Albuterol if needed (increase Advair to daily if using albuterol more than 2 times per week).   I would like you to see a neurologist for Migraine treatment. Maxalt if needed for now. Let me know where to refer.  Current throat symptoms are likely due to either early viral infection or possible allergies.  Continue same medications with Flonase and Atrovent nasal spray as needed, fluids, rest.  Return to the clinic or go to the nearest emergency room if any of your symptoms worsen or new symptoms occur.    If you have lab work done today you will be contacted with your lab results within the next 2 weeks.  If you have not heard from [place] then please contact us. The fastest way to get your results is to register for My Chart.   IF you received an x-ray today, you will receive an invoice from [facility]. Please contact [facility] at [phone] with questions or concerns  regarding your invoice.   IF you received labwork today, you will receive an invoice from [facility]. Please contact [facility] at [phone] with questions or concerns regarding your invoice.   Our billing staff will not be able to assist you with questions regarding bills from these companies.  You will be contacted with the lab results as soon as they are available. The fastest way to get your results is to activate your My Chart  account. Instructions are located on the last page of this paperwork. If you have not heard from [place] regarding the results in 2 weeks, please contact this office.         Signed,   [name], MD [facility] at [place].  10/19/18 9:25 PM

## 2018-10-17 NOTE — H&P (View-Only) (Signed)
History of Present Illness Randall Reed. Randall Tabron Reed; 10/17/2018 10:04 AM) The patient is a 47 year old male who presents with a complaint of Mass. Self-referred for multiple subcutaneous lipomas  This is a 47 year old male who presents with several years of a slowly enlarging mass on his right flank. This has caused some tenderness. He has also developed several other smaller firm masses scattered across his anterior torso. These have become larger and more tender as well. He would like to have these removed. He had one that was evaluated near the right nipple. This was evaluated with mammogram and ultrasound and was felt to be benign.   Past Surgical History Randall Lorenzo, LPN; 16/08/9603 5:40 AM) No pertinent past surgical history  Diagnostic Studies History Randall Lorenzo, LPN; 98/09/9146 8:29 AM) Colonoscopy never  Allergies (Randall Reed, Reed; 10/17/2018 9:43 AM) Clarinex *ANTIHISTAMINES* Ketek *ANTI-INFECTIVE AGENTS - MISC.* Allergies Reconciled  Medication History (Randall Reed, Reed; 10/17/2018 9:41 AM) Vitamin D (50000U Tablet, Oral) Active. Ambien (10MG  Tablet, Oral) Active. Truvada (200-300MG  Tablet, Oral) Active. Diclofenac Sodium (1% Gel, External) Active. Metoprolol Succinate (25MG  CP24 Sprinkle, Oral) Active. Proctozone-HC (2.5% Cream, Rectal) Active. Analpram-HC (1-1% Cream, External) Active. Advair Diskus (250-50MCG/DOSE Aero Pow Br Act, Inhalation) Active. Allegra (30MG  Tablet, Oral) Active. Atenolol-Chlorthalidone (50-25MG  Tablet, Oral) Active. Cyclobenzaprine HCl (10MG  Tablet, Oral) Active. Dicyclomine HCl (20MG  Tablet, Oral) Active. Fluticasone Propionate (Nasal) (50MCG/ACT Suspension, Nasal) Active. Ipratropium Bromide (0.06% Solution, Nasal) Active. Losartan Potassium (100MG  Tablet, Oral) Active. Maxalt (10MG  Tablet, Oral) Active. NexIUM (40MG  Capsule DR, Oral) Active. amLODIPine Besylate (5MG  Tablet, Oral) Active.  Social  History Randall Lorenzo, LPN; 56/21/3086 5:78 AM) Alcohol use Occasional alcohol use. Caffeine use Carbonated beverages, Coffee, Tea. No drug use Tobacco use Never smoker.  Family History Randall Lorenzo, LPN; 46/96/2952 8:41 AM) Diabetes Mellitus Father. Heart disease in male family member before age 13 Hypertension Father.  Other Problems Randall Lorenzo, LPN; 32/44/0102 7:25 AM) Anxiety Disorder Arthritis Back Pain Diverticulosis Gastroesophageal Reflux Disease High blood pressure Migraine Headache     Review of Systems Randall Billings Dockery LPN; 36/64/4034 7:42 AM) General Present- Fatigue and Weight Gain. Not Present- Appetite Loss, Chills, Fever, Night Sweats and Weight Loss. Skin Not Present- Change in Wart/Mole, Dryness, Hives, Jaundice, New Lesions, Non-Healing Wounds, Rash and Ulcer. HEENT Not Present- Earache, Hearing Loss, Hoarseness, Nose Bleed, Oral Ulcers, Ringing in the Ears, Seasonal Allergies, Sinus Pain, Sore Throat, Visual Disturbances, Wears glasses/contact lenses and Yellow Eyes. Respiratory Not Present- Bloody sputum, Chronic Cough, Difficulty Breathing, Snoring and Wheezing. Breast Present- Breast Mass. Not Present- Breast Pain, Nipple Discharge and Skin Changes. Cardiovascular Not Present- Chest Pain, Difficulty Breathing Lying Down, Leg Cramps, Palpitations, Rapid Heart Rate, Shortness of Breath and Swelling of Extremities. Gastrointestinal Present- Change in Bowel Habits and Indigestion. Not Present- Abdominal Pain, Bloating, Bloody Stool, Chronic diarrhea, Constipation, Difficulty Swallowing, Excessive gas, Gets full quickly at meals, Hemorrhoids, Nausea, Rectal Pain and Vomiting. Male Genitourinary Present- Impotence. Not Present- Blood in Urine, Change in Urinary Stream, Frequency, Nocturia, Painful Urination, Urgency and Urine Leakage.  Vitals (Randall Reed; 10/17/2018 9:33 AM) 10/17/2018 9:33 AM Weight: 273.25 lb Height: 71in Body  Surface Area: 2.41 m Body Mass Index: 38.11 kg/m  Temp.: 98.79F  Pulse: 108 (Regular)  P.OX: 93% (Room air) BP: 126/82 (Sitting, Left Arm, Standard)      Physical Exam Randall Key K. Ellah Otte Reed; 10/17/2018 10:05 AM)  The physical exam findings are as follows: Note:WDWN in NAD Eyes: Pupils equal, round; sclera anicteric HENT: Oral mucosa moist; good  dentition Neck: No masses palpated, no thyromegaly Lungs: CTA bilaterally; normal respiratory effort Right chest just above and lateral to the nipple there is a firm subcutaneous 2 cm mass Right flank there is a fairly well demarcated 12 cm diameter subcutaneous mass with no overlying skin changes Right subcostal region there is a 2 cm firm palpable subcutaneous mass with no skin changes Left subcostal there is a 2 cm firm palpable subcutaneous mass with no skin changes Epigastric midline there is a cluster of several 2 cm subcutaneous masses that are immediately adjacent to each other. 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 The surgical procedure has been discussed with the patient. Potential risks, benefits, alternative treatments, and expected outcomes have been explained. All of the patient's questions at this time have been answered. The likelihood of reaching the patient's treatment goal is good. The patient understand the proposed surgical procedure and wishes to  proceed.  Randall Reed. Randall Dover, Reed, [facility] Surgery  General/ Trauma Surgery Beeper [phone]  10/17/2018 10:05 AM

## 2018-10-17 NOTE — Patient Instructions (Addendum)
If you know you will be around perfume or smoke - use advair that day.  Albuterol if needed (increase Advair to daily if using albuterol more than 2 times per week).   I would like you to see a neurologist for Migraine treatment. Maxalt if needed for now. Let me know where to refer.  Current throat symptoms are likely due to either early viral infection or possible allergies.  Continue same medications with Flonase and Atrovent nasal spray as needed, fluids, rest.  Return to the clinic or go to the nearest emergency room if any of your symptoms worsen or new symptoms occur.    If you have lab work done today you will be contacted with your lab results within the next 2 weeks.  If you have not heard from [place] then please contact us. The fastest way to get your results is to register for My Chart.   IF you received an x-ray today, you will receive an invoice from [facility]. Please contact [facility] at [phone] with questions or concerns regarding your invoice.   IF you received labwork today, you will receive an invoice from [facility]. Please contact [facility] at [phone] with questions or concerns regarding your invoice.   Our billing staff will not be able to assist you with questions regarding bills from these companies.  You will be contacted with the lab results as soon as they are available. The fastest way to get your results is to activate your My Chart account. Instructions are located on the last page of this paperwork. If you have not heard from [place] regarding the results in 2 weeks, please contact this office.

## 2018-10-17 NOTE — H&P (Signed)
History of Present Illness Randall Reed. Randall Hardwick MD; 10/17/2018 10:04 AM) The patient is a 47 year old male who presents with a complaint of Mass. Self-referred for multiple subcutaneous lipomas  This is a 47 year old male who presents with several years of a slowly enlarging mass on his right flank. This has caused some tenderness. He has also developed several other smaller firm masses scattered across his anterior torso. These have become larger and more tender as well. He would like to have these removed. He had one that was evaluated near the right nipple. This was evaluated with mammogram and ultrasound and was felt to be benign.   Past Surgical History [name], LPN; 16/08/9603 5:40 AM) No pertinent past surgical history  Diagnostic Studies History [name], LPN; 98/09/9146 8:29 AM) Colonoscopy never  Allergies ([name], CMA; 10/17/2018 9:43 AM) Clarinex *ANTIHISTAMINES* Ketek *ANTI-INFECTIVE AGENTS - MISC.* Allergies Reconciled  Medication History ([name], CMA; 10/17/2018 9:41 AM) Vitamin D (50000U Tablet, Oral) Active. Ambien (10MG  Tablet, Oral) Active. Truvada (200-300MG  Tablet, Oral) Active. Diclofenac Sodium (1% Gel, External) Active. Metoprolol Succinate (25MG  CP24 Sprinkle, Oral) Active. Proctozone-HC (2.5% Cream, Rectal) Active. Analpram-HC (1-1% Cream, External) Active. Advair Diskus (250-50MCG/DOSE Aero Pow Br Act, Inhalation) Active. Allegra (30MG  Tablet, Oral) Active. Atenolol-Chlorthalidone (50-25MG  Tablet, Oral) Active. Cyclobenzaprine HCl (10MG  Tablet, Oral) Active. Dicyclomine HCl (20MG  Tablet, Oral) Active. Fluticasone Propionate (Nasal) (50MCG/ACT Suspension, Nasal) Active. Ipratropium Bromide (0.06% Solution, Nasal) Active. Losartan Potassium (100MG  Tablet, Oral) Active. Maxalt (10MG  Tablet, Oral) Active. NexIUM (40MG  Capsule DR, Oral) Active. amLODIPine Besylate (5MG  Tablet, Oral) Active.  Social  History [name], LPN; 56/21/3086 5:78 AM) Alcohol use Occasional alcohol use. Caffeine use Carbonated beverages, Coffee, Tea. No drug use Tobacco use Never smoker.  Family History [name], LPN; 46/96/2952 8:41 AM) Diabetes Mellitus Father. Heart disease in male family member before age 75 Hypertension Father.  Other Problems [name], LPN; 32/44/0102 7:25 AM) Anxiety Disorder Arthritis Back Pain Diverticulosis Gastroesophageal Reflux Disease High blood pressure Migraine Headache     Review of Systems [name] LPN; 36/64/4034 7:42 AM) General Present- Fatigue and Weight Gain. Not Present- Appetite Loss, Chills, Fever, Night Sweats and Weight Loss. Skin Not Present- Change in Wart/Mole, Dryness, Hives, Jaundice, New Lesions, Non-Healing Wounds, Rash and Ulcer. HEENT Not Present- Earache, Hearing Loss, Hoarseness, Nose Bleed, Oral Ulcers, Ringing in the Ears, Seasonal Allergies, Sinus Pain, Sore Throat, Visual Disturbances, Wears glasses/contact lenses and Yellow Eyes. Respiratory Not Present- Bloody sputum, Chronic Cough, Difficulty Breathing, Snoring and Wheezing. Breast Present- Breast Mass. Not Present- Breast Pain, Nipple Discharge and Skin Changes. Cardiovascular Not Present- Chest Pain, Difficulty Breathing Lying Down, Leg Cramps, Palpitations, Rapid Heart Rate, Shortness of Breath and Swelling of Extremities. Gastrointestinal Present- Change in Bowel Habits and Indigestion. Not Present- Abdominal Pain, Bloating, Bloody Stool, Chronic diarrhea, Constipation, Difficulty Swallowing, Excessive gas, Gets full quickly at meals, Hemorrhoids, Nausea, Rectal Pain and Vomiting. Male Genitourinary Present- Impotence. Not Present- Blood in Urine, Change in Urinary Stream, Frequency, Nocturia, Painful Urination, Urgency and Urine Leakage.  Vitals ([name] CMA; 10/17/2018 9:33 AM) 10/17/2018 9:33 AM Weight: 273.25 lb Height: 71in Body  Surface Area: 2.41 m Body Mass Index: 38.11 kg/m  Temp.: 98.104F  Pulse: 108 (Regular)  P.OX: 93% (Room air) BP: 126/82 (Sitting, Left Arm, Standard)      Physical Exam Rodman Key K. Aleysia Oltmann MD; 10/17/2018 10:05 AM)  The physical exam findings are as follows: Note:WDWN in NAD Eyes: Pupils equal, round; sclera anicteric HENT: Oral mucosa moist; good  dentition Neck: No masses palpated, no thyromegaly Lungs: CTA bilaterally; normal respiratory effort Right chest just above and lateral to the nipple there is a firm subcutaneous 2 cm mass Right flank there is a fairly well demarcated 12 cm diameter subcutaneous mass with no overlying skin changes Right subcostal region there is a 2 cm firm palpable subcutaneous mass with no skin changes Left subcostal there is a 2 cm firm palpable subcutaneous mass with no skin changes Epigastric midline there is a cluster of several 2 cm subcutaneous masses that are immediately adjacent to each other. CV: Regular rate and rhythm; no murmurs; extremities well-perfused with no edema Abd: +bowel sounds, soft, non-tender, no palpable organomegaly; no palpable hernias Skin: Warm, dry; no sign of jaundice Psychiatric - alert and oriented x 4; calm mood and affect    Assessment & Plan Rodman Key K. 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 The surgical procedure has been discussed with the patient. Potential risks, benefits, alternative treatments, and expected outcomes have been explained. All of the patient's questions at this time have been answered. The likelihood of reaching the patient's treatment goal is good. The patient understand the proposed surgical procedure and wishes to  proceed.  Randall Reed. Georgette Dover, MD, [facility] Surgery  General/ Trauma Surgery Beeper [phone]  10/17/2018 10:05 AM

## 2018-10-18 LAB — BASIC METABOLIC PANEL
BUN/Creatinine Ratio: 8 — ABNORMAL LOW (ref 9–20)
BUN: 8 mg/dL (ref 6–24)
CO2: 27 mmol/L (ref 20–29)
Calcium: 9.2 mg/dL (ref 8.7–10.2)
Chloride: 96 mmol/L (ref 96–106)
Creatinine, Ser: 1.05 mg/dL (ref 0.76–1.27)
GFR calc Af Amer: 96 mL/min/{1.73_m2} (ref >59)
GFR calc non Af Amer: 83 mL/min/{1.73_m2} (ref >59)
Glucose: 54 mg/dL — ABNORMAL LOW (ref 65–99)
Potassium: 3.7 mmol/L (ref 3.5–5.2)
Sodium: 140 mmol/L (ref 134–144)

## 2018-10-18 LAB — RPR: RPR: NONREACTIVE

## 2018-10-18 LAB — HIV ANTIBODY (ROUTINE TESTING W REFLEX): HIV Screen 4th Generation wRfx: NONREACTIVE

## 2018-10-19 ENCOUNTER — Encounter: Payer: Self-pay | Admitting: Family Medicine

## 2018-10-20 LAB — GC/CHLAMYDIA PROBE AMP
Chlamydia trachomatis, NAA: NEGATIVE
Neisseria gonorrhoeae by PCR: NEGATIVE

## 2018-10-21 ENCOUNTER — Telehealth: Payer: Self-pay | Admitting: Cardiovascular Disease

## 2018-10-21 ENCOUNTER — Other Ambulatory Visit: Payer: Self-pay | Admitting: Family Medicine

## 2018-10-21 DIAGNOSIS — F411 Generalized anxiety disorder: Secondary | ICD-10-CM

## 2018-10-21 DIAGNOSIS — F5104 Psychophysiologic insomnia: Secondary | ICD-10-CM

## 2018-10-21 NOTE — Progress Notes (Signed)
Pt's chart and cardiology notes reviewed with Dr. Lissa Hoard. Pt needs to f/u with the recommendations of Dr Burt Knack from last appointment prior to surgery on 10/30/18. Spoke with Sunday Spillers at Dr Tsuei's office, she will let Dr Georgette Dover know.

## 2018-10-21 NOTE — Telephone Encounter (Signed)
New message      [facility] Pre-operative Risk Assessment    Request for surgical clearance:  1. What type of surgery is being performed? Excision of Multiple Subcutaneous Lipomas on Torso   2. When is this surgery scheduled? 10/30/2018  3. What type of clearance is required (medical clearance vs. Pharmacy clearance to hold med vs. Both)? Medical   4. Are there any medications that need to be held prior to surgery and how long?n/a   5. Practice name and name of physician performing surgery? [facility], Dr. Georgette Dover   6. What is your office phone number [phone]   7.   What is your office fax number [phone]  8.   Anesthesia type (None, local, MAC, general) ? General    Randall Reed 10/21/2018, 1:37 PM  _________________________________________________________________   (provider comments below)

## 2018-10-22 ENCOUNTER — Encounter: Payer: Self-pay | Admitting: Family Medicine

## 2018-10-22 NOTE — Telephone Encounter (Signed)
Requested medication (s) are due for refill today: Yes  Requested medication (s) are on the active medication list: Yes  Last refill:  For both medications - 09/25/18  Future visit scheduled: Yes  Notes to clinic:  Ambien and Xanax    Requested Prescriptions  Pending Prescriptions Disp Refills   zolpidem (AMBIEN) 10 MG tablet [Pharmacy Med Name: ZOLPIDEM 10MG  TABLETS] 30 tablet     Sig: TAKE 1 TABLET BY MOUTH AT BEDTIME     Not Delegated - Psychiatry:  Anxiolytics/Hypnotics Failed - 10/21/2018 10:50 PM      Failed - This refill cannot be delegated      Failed - Urine Drug Screen completed in last 360 days.      Passed - Valid encounter within last 6 months    Recent Outpatient Visits          5 days ago On pre-exposure prophylaxis for HIV   [facility], Ranell Patrick, MD   5 months ago Routine screening for STI (sexually transmitted infection)   [facility], Ranell Patrick, MD   6 months ago Hypokalemia   [facility], Ranell Patrick, MD   6 months ago Abdominal pain, epigastric   [facility], Ranell Patrick, MD   9 months ago Dysphagia, pharyngoesophageal phase   [facility], Audrie Lia, PA-C      Future Appointments            In 4 months Carlota Raspberry Ranell Patrick, MD [facility] [place] [place]          ALPRAZolam [name]) 1 MG tablet [Pharmacy Med Name: ALPRAZOLAM 1MG  TABLETS] 60 tablet     Sig: TAKE 1 TABLET BY MOUTH TWICE DAILY AS NEEDED     Not Delegated - Psychiatry:  Anxiolytics/Hypnotics Failed - 10/21/2018 10:50 PM      Failed - This refill cannot be delegated      Failed - Urine Drug Screen completed in last 360 days.      Passed - Valid encounter within last 6 months    Recent Outpatient Visits          5 days ago On pre-exposure prophylaxis for HIV   [facility], Ranell Patrick, MD   5 months ago Routine screening for STI (sexually transmitted infection)   [facility], Ranell Patrick, MD   6 months ago Hypokalemia   [facility], Ranell Patrick, MD   6 months ago Abdominal pain, epigastric   [facility], Ranell Patrick, MD   9 months ago Dysphagia, pharyngoesophageal phase   [facility], Audrie Lia, PA-C      Future Appointments            In 4 months Carlota Raspberry Ranell Patrick, MD [facility] [place], [facility]

## 2018-10-23 NOTE — Telephone Encounter (Signed)
LVM to call back.

## 2018-10-23 NOTE — Telephone Encounter (Signed)
   Primary Cardiologist:[name]   Chart reviewed as part of pre-operative protocol coverage. Patient was contacted 10/23/2018 in reference to pre-operative risk assessment for pending surgery as outlined below.  Randall Reed. was last seen 01/17/2018 by [name].  Since that day, Randall Reed. has done well w/o any cardiac symptoms. No exertional CP or dyspnea.   Therefore, based on ACC/AHA guidelines, the patient would be at acceptable risk for the planned procedure without further cardiovascular testing.   Can hold ASA if absolutely necessary.   I will route this recommendation to the requesting party via Epic fax function and remove from pre-op pool.  Please call with questions.  Lyda Jester, PA-C 10/23/2018, 4:13 PM

## 2018-10-23 NOTE — Telephone Encounter (Signed)
Med refill approval or deny needed 

## 2018-10-24 ENCOUNTER — Encounter (HOSPITAL_BASED_OUTPATIENT_CLINIC_OR_DEPARTMENT_OTHER): Payer: Self-pay | Admitting: *Deleted

## 2018-10-24 ENCOUNTER — Other Ambulatory Visit: Payer: Self-pay

## 2018-10-24 NOTE — Telephone Encounter (Signed)
Refilled both meds.

## 2018-10-27 ENCOUNTER — Ambulatory Visit: Payer: BLUE CROSS/BLUE SHIELD | Admitting: Family Medicine

## 2018-10-30 ENCOUNTER — Encounter (HOSPITAL_BASED_OUTPATIENT_CLINIC_OR_DEPARTMENT_OTHER): Payer: Self-pay | Admitting: Certified Registered"

## 2018-10-30 ENCOUNTER — Encounter (HOSPITAL_BASED_OUTPATIENT_CLINIC_OR_DEPARTMENT_OTHER)
Admission: RE | Disposition: A | Discharge status: Home / Self Care | Payer: Self-pay | Source: Home / Self Care | Attending: Surgery

## 2018-10-30 ENCOUNTER — Ambulatory Visit (HOSPITAL_BASED_OUTPATIENT_CLINIC_OR_DEPARTMENT_OTHER)
Admission: RE | Admit: 2018-10-30 | Discharge: 2018-10-30 | Disposition: A | Discharge status: Home / Self Care | Payer: BLUE CROSS/BLUE SHIELD | Attending: Surgery | Admitting: Surgery

## 2018-10-30 ENCOUNTER — Ambulatory Visit (HOSPITAL_BASED_OUTPATIENT_CLINIC_OR_DEPARTMENT_OTHER): Payer: BLUE CROSS/BLUE SHIELD | Admitting: Anesthesiology

## 2018-10-30 ENCOUNTER — Other Ambulatory Visit: Payer: Self-pay

## 2018-10-30 DIAGNOSIS — Z883 Allergy status to other anti-infective agents status: Secondary | ICD-10-CM | POA: Diagnosis not present

## 2018-10-30 DIAGNOSIS — K219 Gastro-esophageal reflux disease without esophagitis: Secondary | ICD-10-CM | POA: Insufficient documentation

## 2018-10-30 DIAGNOSIS — M1990 Unspecified osteoarthritis, unspecified site: Secondary | ICD-10-CM | POA: Insufficient documentation

## 2018-10-30 DIAGNOSIS — Z791 Long term (current) use of non-steroidal anti-inflammatories (NSAID): Secondary | ICD-10-CM | POA: Diagnosis not present

## 2018-10-30 DIAGNOSIS — Z79899 Other long term (current) drug therapy: Secondary | ICD-10-CM | POA: Insufficient documentation

## 2018-10-30 DIAGNOSIS — D171 Benign lipomatous neoplasm of skin and subcutaneous tissue of trunk: Secondary | ICD-10-CM | POA: Diagnosis present

## 2018-10-30 DIAGNOSIS — I10 Essential (primary) hypertension: Secondary | ICD-10-CM | POA: Insufficient documentation

## 2018-10-30 DIAGNOSIS — Z8249 Family history of ischemic heart disease and other diseases of the circulatory system: Secondary | ICD-10-CM | POA: Insufficient documentation

## 2018-10-30 DIAGNOSIS — G43909 Migraine, unspecified, not intractable, without status migrainosus: Secondary | ICD-10-CM | POA: Insufficient documentation

## 2018-10-30 DIAGNOSIS — Z7951 Long term (current) use of inhaled steroids: Secondary | ICD-10-CM | POA: Insufficient documentation

## 2018-10-30 DIAGNOSIS — Z888 Allergy status to other drugs, medicaments and biological substances status: Secondary | ICD-10-CM | POA: Insufficient documentation

## 2018-10-30 HISTORY — PX: LIPOMA EXCISION: SHX5283

## 2018-10-30 SURGERY — EXCISION LIPOMA
Anesthesia: General

## 2018-10-30 MED ORDER — CEFAZOLIN SODIUM-DEXTROSE 2-4 GM/100ML-% IV SOLN
2.0000 g | INTRAVENOUS | Status: AC
  Administered 2018-10-30: 2 g via INTRAVENOUS

## 2018-10-30 MED ORDER — BUPIVACAINE-EPINEPHRINE 0.25% -1:200000 IJ SOLN
INTRAMUSCULAR | Status: DC | PRN
  Administered 2018-10-30: 50 mL

## 2018-10-30 MED ORDER — FENTANYL CITRATE (PF) 100 MCG/2ML IJ SOLN
INTRAMUSCULAR | Status: AC
  Filled 2018-10-30: qty 2

## 2018-10-30 MED ORDER — MEPERIDINE HCL 25 MG/ML IJ SOLN: 6.2500 mg | INTRAMUSCULAR | Status: DC | PRN

## 2018-10-30 MED ORDER — PROPOFOL 10 MG/ML IV BOLUS
INTRAVENOUS | Status: DC | PRN
  Administered 2018-10-30: 200 mg via INTRAVENOUS

## 2018-10-30 MED ORDER — HYDROMORPHONE HCL 1 MG/ML IJ SOLN
INTRAMUSCULAR | Status: AC
  Filled 2018-10-30: qty 0.5

## 2018-10-30 MED ORDER — CHLORHEXIDINE GLUCONATE CLOTH 2 % EX PADS: 6.0000 | MEDICATED_PAD | Freq: Once | CUTANEOUS | Status: DC

## 2018-10-30 MED ORDER — LACTATED RINGERS IV SOLN: 500.0000 mL | INTRAVENOUS | Status: DC

## 2018-10-30 MED ORDER — FENTANYL CITRATE (PF) 100 MCG/2ML IJ SOLN
50.0000 ug | INTRAMUSCULAR | Status: AC | PRN
  Administered 2018-10-30: 100 ug via INTRAVENOUS
  Administered 2018-10-30 (×2): 25 ug via INTRAVENOUS

## 2018-10-30 MED ORDER — HYDROMORPHONE HCL 1 MG/ML IJ SOLN: 0.2500 mg | INTRAMUSCULAR | Status: DC | PRN

## 2018-10-30 MED ORDER — OXYCODONE HCL 5 MG PO TABS
5.0000 mg | ORAL_TABLET | Freq: Once | ORAL | Status: AC | PRN
  Administered 2018-10-30: 5 mg via ORAL

## 2018-10-30 MED ORDER — 0.9 % SODIUM CHLORIDE (POUR BTL) OPTIME
TOPICAL | Status: DC | PRN
  Administered 2018-10-30: 1000 mL

## 2018-10-30 MED ORDER — CEFAZOLIN SODIUM-DEXTROSE 2-4 GM/100ML-% IV SOLN
INTRAVENOUS | Status: AC
  Filled 2018-10-30: qty 100

## 2018-10-30 MED ORDER — OXYCODONE HCL 5 MG PO TABS
ORAL_TABLET | ORAL | Status: AC
  Filled 2018-10-30: qty 1

## 2018-10-30 MED ORDER — LIDOCAINE HCL (CARDIAC) PF 100 MG/5ML IV SOSY
PREFILLED_SYRINGE | INTRAVENOUS | Status: DC | PRN
  Administered 2018-10-30: 80 mg via INTRAVENOUS

## 2018-10-30 MED ORDER — MIDAZOLAM HCL 5 MG/5ML IJ SOLN
INTRAMUSCULAR | Status: DC | PRN
  Administered 2018-10-30: 2 mg via INTRAVENOUS

## 2018-10-30 MED ORDER — OXYCODONE HCL 5 MG/5ML PO SOLN: 5.0000 mg | Freq: Once | ORAL | Status: AC | PRN

## 2018-10-30 MED ORDER — ONDANSETRON HCL 4 MG/2ML IJ SOLN
INTRAMUSCULAR | Status: DC | PRN
  Administered 2018-10-30: 4 mg via INTRAVENOUS

## 2018-10-30 MED ORDER — MIDAZOLAM HCL 2 MG/2ML IJ SOLN
INTRAMUSCULAR | Status: AC
  Filled 2018-10-30: qty 2

## 2018-10-30 MED ORDER — LACTATED RINGERS IV SOLN
INTRAVENOUS | Status: DC
  Administered 2018-10-30 (×2): via INTRAVENOUS

## 2018-10-30 MED ORDER — PROMETHAZINE HCL 25 MG/ML IJ SOLN: 6.2500 mg | INTRAMUSCULAR | Status: DC | PRN

## 2018-10-30 MED ORDER — DEXAMETHASONE SODIUM PHOSPHATE 4 MG/ML IJ SOLN
INTRAMUSCULAR | Status: DC | PRN
  Administered 2018-10-30: 10 mg via INTRAVENOUS

## 2018-10-30 MED ORDER — BUPIVACAINE-EPINEPHRINE 0.25% -1:200000 IJ SOLN
INTRAMUSCULAR | Status: AC
  Filled 2018-10-30: qty 1

## 2018-10-30 MED ORDER — HYDROCODONE-ACETAMINOPHEN 5-325 MG PO TABS: 1.0000 | ORAL_TABLET | Freq: Four times a day (QID) | ORAL | 0 refills | Status: DC | PRN

## 2018-10-30 SURGICAL SUPPLY — 46 items
APL SKNCLS STERI-STRIP NONHPOA (GAUZE/BANDAGES/DRESSINGS) ×2
BENZOIN TINCTURE PRP APPL 2/3 (GAUZE/BANDAGES/DRESSINGS) ×3 IMPLANT
BLADE CLIPPER SURG (BLADE) ×1 IMPLANT
BLADE SURG 15 STRL LF DISP TIS (BLADE) ×1 IMPLANT
BLADE SURG 15 STRL SS (BLADE) ×2
CANISTER SUCT 1200ML W/VALVE (MISCELLANEOUS) ×1 IMPLANT
CHLORAPREP W/TINT 26ML (MISCELLANEOUS) ×2 IMPLANT
COVER BACK TABLE 60X90IN (DRAPES) ×2 IMPLANT
COVER MAYO STAND STRL (DRAPES) ×2 IMPLANT
COVER WAND RF STERILE (DRAPES) IMPLANT
DECANTER SPIKE VIAL GLASS SM (MISCELLANEOUS) IMPLANT
DRAPE LAPAROTOMY 100X72 PEDS (DRAPES) ×2 IMPLANT
DRAPE UTILITY XL STRL (DRAPES) ×2 IMPLANT
DRSG TEGADERM 2-3/8X2-3/4 SM (GAUZE/BANDAGES/DRESSINGS) ×7 IMPLANT
DRSG TEGADERM 4X4.75 (GAUZE/BANDAGES/DRESSINGS) ×4 IMPLANT
ELECT COATED BLADE 2.86 ST (ELECTRODE) ×2 IMPLANT
ELECT REM PT RETURN 9FT ADLT (ELECTROSURGICAL) ×2
ELECTRODE REM PT RTRN 9FT ADLT (ELECTROSURGICAL) ×1 IMPLANT
GAUZE 4X4 16PLY RFD (DISPOSABLE) ×1 IMPLANT
GAUZE SPONGE 4X4 12PLY STRL (GAUZE/BANDAGES/DRESSINGS) ×1 IMPLANT
GAUZE SPONGE 4X4 12PLY STRL LF (GAUZE/BANDAGES/DRESSINGS) IMPLANT
GLOVE BIO SURGEON STRL SZ7 (GLOVE) ×2 IMPLANT
GLOVE BIOGEL PI IND STRL 7.5 (GLOVE) ×1 IMPLANT
GLOVE BIOGEL PI INDICATOR 7.5 (GLOVE) ×1
GOWN STRL REUS W/ TWL LRG LVL3 (GOWN DISPOSABLE) ×2 IMPLANT
GOWN STRL REUS W/TWL LRG LVL3 (GOWN DISPOSABLE) ×4
NDL HYPO 25X1 1.5 SAFETY (NEEDLE) ×1 IMPLANT
NEEDLE HYPO 25X1 1.5 SAFETY (NEEDLE) ×2 IMPLANT
NS IRRIG 1000ML POUR BTL (IV SOLUTION) ×1 IMPLANT
PACK BASIN DAY SURGERY FS (CUSTOM PROCEDURE TRAY) ×2 IMPLANT
PENCIL BUTTON HOLSTER BLD 10FT (ELECTRODE) ×2 IMPLANT
SLEEVE SCD COMPRESS KNEE MED (MISCELLANEOUS) ×1 IMPLANT
SPONGE GAUZE 2X2 8PLY STRL LF (GAUZE/BANDAGES/DRESSINGS) ×8 IMPLANT
STRIP CLOSURE SKIN 1/2X4 (GAUZE/BANDAGES/DRESSINGS) ×5 IMPLANT
SUT MON AB 4-0 PC3 18 (SUTURE) ×3 IMPLANT
SUT PROLENE 6 0 P 1 18 (SUTURE) IMPLANT
SUT SILK 2 0 PERMA HAND 18 BK (SUTURE) IMPLANT
SUT VIC AB 3-0 SH 27 (SUTURE) ×2
SUT VIC AB 3-0 SH 27X BRD (SUTURE) IMPLANT
SUT VICRYL 3-0 CR8 SH (SUTURE) IMPLANT
SYR BULB 3OZ (MISCELLANEOUS) ×2 IMPLANT
SYR CONTROL 10ML LL (SYRINGE) ×2 IMPLANT
TOWEL GREEN STERILE FF (TOWEL DISPOSABLE) ×2 IMPLANT
TOWEL OR NON WOVEN STRL DISP B (DISPOSABLE) ×2 IMPLANT
TUBE CONNECTING 20X1/4 (TUBING) ×1 IMPLANT
YANKAUER SUCT BULB TIP NO VENT (SUCTIONS) ×1 IMPLANT

## 2018-10-30 NOTE — Discharge Instructions (Signed)
[facility] °Office Phone Number [phone] ° °Lipoma Excision: POST OP INSTRUCTIONS ° °Always review your discharge instruction sheet given to you by the facility where your surgery was performed. ° °IF YOU HAVE DISABILITY OR FAMILY LEAVE FORMS, YOU MUST BRING THEM TO THE OFFICE FOR PROCESSING.  DO NOT GIVE THEM TO YOUR DOCTOR. ° °1. A prescription for pain medication may be given to you upon discharge.  Take your pain medication as prescribed, if needed.  If narcotic pain medicine is not needed, then you may take acetaminophen (Tylenol) or ibuprofen (Advil) as needed. °2. Take your usually prescribed medications unless otherwise directed °3. If you need a refill on your pain medication, please contact your pharmacy.  They will contact our office to request authorization.  Prescriptions will not be filled after 5pm or on week-ends. °4. You should eat very light the first 24 hours after surgery, such as soup, crackers, pudding, etc.  Resume your normal diet the day after surgery. °5. Most patients will experience some swelling and bruising around the surgical site.  Ice packs will help.  Swelling and bruising can take several days to resolve.  °6. It is common to experience some constipation if taking pain medication after surgery.  Increasing fluid intake and taking a stool softener will usually help or prevent this problem from occurring.  A mild laxative (Milk of Magnesia or Miralax) should be taken according to package directions if there are no bowel movements after 48 hours. °7. You may remove your bandages 48 hours after surgery, and you may shower at that time.  You will have steri-strips (small skin tapes) in place directly over the incision.  These strips should be left on the skin for 7-10 days.   °8. ACTIVITIES:  You may resume regular daily activities (gradually increasing) beginning the next day.   You may have sexual intercourse when it is comfortable. °a. You may drive when you no  longer are taking prescription pain medication, you can comfortably wear a seatbelt, and you can safely maneuver your car and apply brakes. °b. RETURN TO WORK:  1-2 weeks °9. You should see your doctor in the office for a follow-up appointment approximately two to three weeks after your surgery.   ° °WHEN TO CALL YOUR DOCTOR: °1. Fever over 101.0 °2. Nausea and/or vomiting. °3. Extreme swelling or bruising. °4. Continued bleeding from incision. °5. Increased pain, redness, or drainage from the incision. ° °The clinic staff is available to answer your questions during regular business hours.  Please don’t hesitate to call and ask to speak to one of the nurses for clinical concerns.  If you have a medical emergency, go to the nearest emergency room or call 911.  A surgeon from [facility] is always on call at the hospital. ° °For further questions, please visit [email or web address]  ° ° ° ° ° °Post Anesthesia Home Care Instructions ° °Activity: °Get plenty of rest for the remainder of the day. A responsible individual must stay with you for 24 hours following the procedure.  °For the next 24 hours, DO NOT: °-Drive a car °-Operate machinery °-Drink alcoholic beverages °-Take any medication unless instructed by your physician °-Make any legal decisions or sign important papers. ° °Meals: °Start with liquid foods such as gelatin or soup. Progress to regular foods as tolerated. Avoid greasy, spicy, heavy foods. If nausea and/or vomiting occur, drink only clear liquids until the nausea and/or vomiting subsides. Call your physician if vomiting continues. ° °  Special Instructions/Symptoms: °Your throat may feel dry or sore from the anesthesia or the breathing tube placed in your throat during surgery. If this causes discomfort, gargle with warm salt water. The discomfort should disappear within 24 hours. ° °If you had a scopolamine patch placed behind your ear for the management of post- operative nausea  and/or vomiting: ° °1. The medication in the patch is effective for 72 hours, after which it should be removed.  Wrap patch in a tissue and discard in the trash. Wash hands thoroughly with soap and water. °2. You may remove the patch earlier than 72 hours if you experience unpleasant side effects which may include dry mouth, dizziness or visual disturbances. °3. Avoid touching the patch. Wash your hands with soap and water after contact with the patch. °  ° °

## 2018-10-30 NOTE — Anesthesia Preprocedure Evaluation (Signed)
Anesthesia Evaluation  Patient identified by MRN, date of birth, ID band Patient awake    Reviewed: Allergy & Precautions, NPO status , Patient's Chart, lab work & pertinent test results  Airway Mallampati: II  TM Distance: >3 FB Neck ROM: Full    Dental no notable dental hx.    Pulmonary asthma ,    Pulmonary exam normal breath sounds clear to auscultation       Cardiovascular hypertension, Normal cardiovascular exam Rhythm:Regular Rate:Normal     Neuro/Psych  Headaches, Anxiety Depression negative psych ROS   GI/Hepatic Neg liver ROS, GERD  ,  Endo/Other  negative endocrine ROS  Renal/GU negative Renal ROS  negative genitourinary   Musculoskeletal  (+) Arthritis , Osteoarthritis,    Abdominal (+) + obese,   Peds negative pediatric ROS (+)  Hematology negative hematology ROS (+)   Anesthesia Other Findings   Reproductive/Obstetrics negative OB ROS                             Anesthesia Physical Anesthesia Plan  ASA: II  Anesthesia Plan: General   Post-op Pain Management:    Induction: Intravenous  PONV Risk Score and Plan: 2 and Ondansetron and Midazolam  Airway Management Planned: LMA and Oral ETT  Additional Equipment:   Intra-op Plan:   Post-operative Plan: Extubation in OR  Informed Consent: I have reviewed the patients History and Physical, chart, labs and discussed the procedure including the risks, benefits and alternatives for the proposed anesthesia with the patient or authorized representative who has indicated his/her understanding and acceptance.   Dental advisory given  Plan Discussed with: CRNA  Anesthesia Plan Comments:         Anesthesia Quick Evaluation

## 2018-10-30 NOTE — Anesthesia Procedure Notes (Signed)
Procedure Name: LMA Insertion Date/Time: 10/30/2018 1:11 PM Performed by: Lyndee Leo, CRNA Pre-anesthesia Checklist: Patient identified, Emergency Drugs available, Suction available and Patient being monitored Patient Re-evaluated:Patient Re-evaluated prior to induction Oxygen Delivery Method: Circle system utilized Preoxygenation: Pre-oxygenation with 100% oxygen Induction Type: IV induction Ventilation: Mask ventilation without difficulty LMA: LMA inserted LMA Size: 4.0 Number of attempts: 1 Airway Equipment and Method: Bite block Placement Confirmation: positive ETCO2 Tube secured with: Tape Dental Injury: Teeth and Oropharynx as per pre-operative assessment

## 2018-10-30 NOTE — Anesthesia Postprocedure Evaluation (Signed)
Anesthesia Post Note  Patient: Randall Reed.  Procedure(s) Performed: EXCISION OF MULTIPLE SUBCUTANEOUS LIPOMAS ON TORSO (N/A )     Patient location during evaluation: PACU Anesthesia Type: General Level of consciousness: awake and alert Pain management: pain level controlled Vital Signs Assessment: post-procedure vital signs reviewed and stable Respiratory status: spontaneous breathing, nonlabored ventilation, respiratory function stable and patient connected to nasal cannula oxygen Cardiovascular status: blood pressure returned to baseline and stable Postop Assessment: no apparent nausea or vomiting Anesthetic complications: no    Last Vitals:  Vitals:   10/30/18 1545 10/30/18 1600  BP: (!) 141/92 (!) 125/94  Pulse: 100 97  Resp: 12 12  Temp:    SpO2: 91% 95%    Last Pain:  Vitals:   10/30/18 1600  TempSrc:   PainSc: 4                  Montez Hageman

## 2018-10-30 NOTE — Transfer of Care (Signed)
Immediate Anesthesia Transfer of Care Note  Patient: Randall Reed.  Procedure(s) Performed: EXCISION OF MULTIPLE SUBCUTANEOUS LIPOMAS ON TORSO (N/A )  Patient Location: PACU  Anesthesia Type:General  Level of Consciousness: sedated  Airway & Oxygen Therapy: Patient Spontanous Breathing and Patient connected to face mask oxygen  Post-op Assessment: Report given to RN and Post -op Vital signs reviewed and stable  Post vital signs: Reviewed and stable  Last Vitals:  Vitals Value Taken Time  BP 132/91 10/30/2018  3:19 PM  Temp    Pulse 99 10/30/2018  3:23 PM  Resp 13 10/30/2018  3:23 PM  SpO2 90 % 10/30/2018  3:23 PM  Vitals shown include unvalidated device data.  Last Pain:  Vitals:   10/30/18 1128  TempSrc: Oral  PainSc: 0-No pain         Complications: No apparent anesthesia complications

## 2018-10-30 NOTE — Op Note (Signed)
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 This has caused some tenderness. He has also developed several other smaller firm masses scattered across his anterior torso. These have become larger and more tender as well. He would like to have these removed. He had one that was evaluated near the right nipple. This was evaluated with mammogram and ultrasound and was felt to be benign.  Description of procedure: The patient is brought to the operating room and placed in the supine position on the operating room table.  After an adequate level of general anesthesia was obtained, the patient's anterior chest and upper abdomen were prepped with ChloraPrep and draped in sterile fashion.  A timeout was taken to ensure the proper patient and proper procedure.  All of the palpable lipomas had been previously marked with a skin marker.  We made transverse incisions across each 1 and dissected down through the dermis to the surface of the lipomas.  The largest is in the right flank.  This was excised through a 4 cm transverse incision.  The subcutaneous lipoma was adherent to the anterior abdominal wall and measured approximately 12 cm in diameter.  It was fairly thin thickness.  We excised this down to the fascia.  Just above this area on the right flank there is a 2 cm incision and we excised 2 small lipomas through this incision.  There are 2 lipomas near and above the right nipple.  These were excised through separate transverse incisions.  The one near the nipple measured about 3 cm.  The one near the right clavicle  measured only 2 cm.  In the center of the upper abdominal wall in the epigastrium there is a cluster of multiple small subcutaneous lipomas.  We made a total of four 1.5 to 2 cm incisions and excise multiple firm lipomas from these.  In the left upper quadrant we made a total of 5 other incisions to excise 1 to 2 cm subcutaneous lipomas.  We had a total of 13 incisions.  The largest incision in the right flank was closed with 3-0 Vicryl and 4-0 Monocryl.  The small incisions were all closed with 4-0 Monocryl.  Benzoin Steri-Strips were applied.  Occlusive dressings were placed.  The patient was then extubated and brought to the recovery room in stable condition.  All sponge, instrument, and needle counts are correct.  Imogene Burn. Georgette Dover, MD, [facility] Surgery  General/ Trauma Surgery Beeper [phone]  10/30/2018 3:14 PM

## 2018-10-30 NOTE — Interval H&P Note (Signed)
History and Physical Interval Note:  10/30/2018 11:31 AM  Randall Reed.  has presented today for surgery, with the diagnosis of Subcutaneous lipomas - torso  The various methods of treatment have been discussed with the patient and family. After consideration of risks, benefits and other options for treatment, the patient has consented to  Procedure(s): EXCISION OF MULTIPLE SUBCUTANEOUS LIPOMAS ON TORSO (N/A) as a surgical intervention .  The patient's history has been reviewed, patient examined, no change in status, stable for surgery.  I have reviewed the patient's chart and labs.  Questions were answered to the patient's satisfaction.     [name]

## 2018-10-31 ENCOUNTER — Encounter (HOSPITAL_BASED_OUTPATIENT_CLINIC_OR_DEPARTMENT_OTHER): Payer: Self-pay | Admitting: Surgery

## 2018-10-31 ENCOUNTER — Other Ambulatory Visit: Payer: Self-pay | Admitting: Cardiovascular Disease

## 2018-11-07 LAB — POCT I-STAT, CHEM 8
BUN: 16 mg/dL (ref 6–20)
Calcium, Ion: 1.08 mmol/L — ABNORMAL LOW (ref 1.15–1.40)
Chloride: 103 mmol/L (ref 98–111)
Creatinine, Ser: 1 mg/dL (ref 0.61–1.24)
GLUCOSE: 94 mg/dL (ref 70–99)
HCT: 53 % — ABNORMAL HIGH (ref 39.0–52.0)
Hemoglobin: 18 g/dL — ABNORMAL HIGH (ref 13.0–17.0)
Potassium: 3.4 mmol/L — ABNORMAL LOW (ref 3.5–5.1)
Sodium: 137 mmol/L (ref 135–145)
TCO2: 27 mmol/L (ref 22–32)

## 2018-11-26 ENCOUNTER — Other Ambulatory Visit: Payer: Self-pay | Admitting: Family Medicine

## 2018-11-26 DIAGNOSIS — F5104 Psychophysiologic insomnia: Secondary | ICD-10-CM

## 2018-11-26 DIAGNOSIS — F411 Generalized anxiety disorder: Secondary | ICD-10-CM

## 2018-11-27 NOTE — Telephone Encounter (Signed)
Patient is requesting a refill of the following medications: Requested Prescriptions   Pending Prescriptions Disp Refills  . zolpidem (AMBIEN) 10 MG tablet [Pharmacy Med Name: ZOLPIDEM 10MG  TABLETS] 30 tablet     Sig: TAKE 1 TABLET BY MOUTH AT BEDTIME  . ALPRAZolam (XANAX) 1 MG tablet [Pharmacy Med Name: ALPRAZOLAM 1MG  TABLETS] 60 tablet     Sig: TAKE 1 TABLET BY MOUTH TWICE DAILY AS NEEDED    Date of patient request: 11/26/2018 Last office visit: 10/17/2018 Date of last refill: 10/24/2018 Last refill amount: 30,60 Follow up time period per chart:

## 2018-11-27 NOTE — Telephone Encounter (Signed)
Controlled substance database (PDMP) reviewed. No concerns appreciated.   Alprazolam No. 60 on December 20, Ambien No. 30 on December 20.  meds refilled.

## 2018-11-27 NOTE — Telephone Encounter (Signed)
Requested medication (s) are due for refill today: zolpidem yes  Requested medication (s) are on the active medication list: yes  Last refill:  10/24/18  Future visit scheduled: yes  Notes to clinic:  Not delegated  Requested medication (s) are due for refill today: alprazolam yes  Requested medication (s) are on the active medication list: yes  Last refill:  10/24/18  Future visit scheduled: yes  Notes to clinic:  Not delegated   Requested Prescriptions  Pending Prescriptions Disp Refills   zolpidem (AMBIEN) 10 MG tablet [Pharmacy Med Name: ZOLPIDEM 10MG  TABLETS] 30 tablet     Sig: TAKE 1 TABLET BY MOUTH AT BEDTIME     Not Delegated - Psychiatry:  Anxiolytics/Hypnotics Failed - 11/26/2018 10:50 PM      Failed - This refill cannot be delegated      Failed - Urine Drug Screen completed in last 360 days.      Passed - Valid encounter within last 6 months    Recent Outpatient Visits          1 month ago On pre-exposure prophylaxis for HIV   [facility], Ranell Patrick, MD   7 months ago Routine screening for STI (sexually transmitted infection)   [facility], Ranell Patrick, MD   7 months ago Hypokalemia   [facility], Ranell Patrick, MD   7 months ago Abdominal pain, epigastric   [facility], Ranell Patrick, MD   11 months ago Dysphagia, pharyngoesophageal phase   [facility], Audrie Lia, PA-C      Future Appointments            In 3 months Carlota Raspberry Ranell Patrick, MD [facility] [place], [place]          ALPRAZolam [name]) 1 MG tablet [Pharmacy Med Name: ALPRAZOLAM 1MG  TABLETS] 60 tablet     Sig: TAKE 1 TABLET BY MOUTH TWICE DAILY AS NEEDED     Not Delegated - Psychiatry:  Anxiolytics/Hypnotics Failed - 11/26/2018 10:50 PM      Failed - This refill cannot be delegated      Failed - Urine Drug Screen completed in last 360 days.      Passed - Valid encounter within last 6 months    Recent Outpatient  Visits          1 month ago On pre-exposure prophylaxis for HIV   [facility], Ranell Patrick, MD   7 months ago Routine screening for STI (sexually transmitted infection)   [facility], Ranell Patrick, MD   7 months ago Hypokalemia   [facility], Ranell Patrick, MD   7 months ago Abdominal pain, epigastric   [facility], Ranell Patrick, MD   11 months ago Dysphagia, pharyngoesophageal phase   [facility], Audrie Lia, PA-C      Future Appointments            In 3 months Carlota Raspberry Ranell Patrick, MD [facility] at [place], [facility]

## 2018-12-02 ENCOUNTER — Other Ambulatory Visit: Payer: Self-pay | Admitting: Family Medicine

## 2018-12-02 DIAGNOSIS — J309 Allergic rhinitis, unspecified: Secondary | ICD-10-CM

## 2018-12-02 DIAGNOSIS — J45909 Unspecified asthma, uncomplicated: Secondary | ICD-10-CM

## 2018-12-02 NOTE — Telephone Encounter (Signed)
Requested Prescriptions  Pending Prescriptions Disp Refills  . montelukast (SINGULAIR) 10 MG tablet [Pharmacy Med Name: MONTELUKAST 10MG  TABLETS] 90 tablet 0    Sig: TAKE 1 TABLET(10 MG) BY MOUTH AT BEDTIME     Pulmonology:  Leukotriene Inhibitors Passed - 12/02/2018  3:26 AM      Passed - Valid encounter within last 12 months    Recent Outpatient Visits          1 month ago On pre-exposure prophylaxis for HIV   [facility], Ranell Patrick, MD   7 months ago Routine screening for STI (sexually transmitted infection)   [facility], Ranell Patrick, MD   7 months ago Hypokalemia   [facility], Ranell Patrick, MD   8 months ago Abdominal pain, epigastric   [facility], Ranell Patrick, MD   11 months ago Dysphagia, pharyngoesophageal phase   [facility], Audrie Lia, PA-C      Future Appointments            In 3 months Carlota Raspberry Ranell Patrick, MD Primary Care at [place], [facility]

## 2018-12-20 ENCOUNTER — Other Ambulatory Visit: Payer: Self-pay | Admitting: Cardiovascular Disease

## 2018-12-20 DIAGNOSIS — J029 Acute pharyngitis, unspecified: Secondary | ICD-10-CM | POA: Diagnosis not present

## 2018-12-20 DIAGNOSIS — I10 Essential (primary) hypertension: Secondary | ICD-10-CM | POA: Diagnosis not present

## 2018-12-23 ENCOUNTER — Other Ambulatory Visit: Payer: Self-pay | Admitting: Family Medicine

## 2018-12-23 ENCOUNTER — Other Ambulatory Visit: Payer: Self-pay

## 2018-12-23 DIAGNOSIS — F5104 Psychophysiologic insomnia: Secondary | ICD-10-CM

## 2018-12-23 DIAGNOSIS — F411 Generalized anxiety disorder: Secondary | ICD-10-CM

## 2018-12-23 DIAGNOSIS — K580 Irritable bowel syndrome with diarrhea: Secondary | ICD-10-CM

## 2018-12-23 MED ORDER — CHLORTHALIDONE 25 MG PO TABS: ORAL_TABLET | ORAL | 0 refills | Status: DC

## 2018-12-23 NOTE — Telephone Encounter (Signed)
Please advise on refill. Controlled.  

## 2018-12-23 NOTE — Telephone Encounter (Signed)
Requested medication (s) are due for refill today: alprazolam yes  Requested medication (s) are on the active medication list: yes  Last refill:  11/26/2018  Future visit scheduled: yes  Notes to clinic:  Not delegated  Requested medication (s) are due for refill today: zolpidem yes  Requested medication (s) are on the active medication list: yes  Last refill:  11/26/2018  Future visit scheduled: yes  Notes to clinic:  Not delegated   Requested Prescriptions  Pending Prescriptions Disp Refills   ALPRAZolam (XANAX) 1 MG tablet [Pharmacy Med Name: ALPRAZOLAM 1MG  TABLETS] 60 tablet     Sig: TAKE 1 TABLET BY MOUTH TWICE DAILY AS NEEDED     Not Delegated - Psychiatry:  Anxiolytics/Hypnotics Failed - 12/23/2018  9:05 AM      Failed - This refill cannot be delegated      Failed - Urine Drug Screen completed in last 360 days.      Passed - Valid encounter within last 6 months    Recent Outpatient Visits          2 months ago On pre-exposure prophylaxis for HIV   [facility], Ranell Patrick, MD   7 months ago Routine screening for STI (sexually transmitted infection)   [facility], Ranell Patrick, MD   8 months ago Hypokalemia   [facility], Ranell Patrick, MD   8 months ago Abdominal pain, epigastric   [facility], Ranell Patrick, MD   12 months ago Dysphagia, pharyngoesophageal phase   [facility] [place], PA-C      Future Appointments            In 2 months Carlota Raspberry Ranell Patrick, MD [facility] at [place], [place]          zolpidem (AMBIEN) 10 MG tablet [Pharmacy Med Name: ZOLPIDEM 10MG  TABLETS] 30 tablet     Sig: TAKE 1 TABLET BY MOUTH AT BEDTIME     Not Delegated - Psychiatry:  Anxiolytics/Hypnotics Failed - 12/23/2018  9:05 AM      Failed - This refill cannot be delegated      Failed - Urine Drug Screen completed in last 360 days.      Passed - Valid encounter within last 6 months    Recent  Outpatient Visits          2 months ago On pre-exposure prophylaxis for HIV   [facility], Ranell Patrick, MD   7 months ago Routine screening for STI (sexually transmitted infection)   [facility], Ranell Patrick, MD   8 months ago Hypokalemia   [facility], Ranell Patrick, MD   8 months ago Abdominal pain, epigastric   [facility], Ranell Patrick, MD   12 months ago Dysphagia, pharyngoesophageal phase   [facility] at [place], PA-C      Future Appointments            In 2 months Carlota Raspberry Ranell Patrick, MD Primary Care at [place], [facility]          dicyclomine (BENTYL) 20 MG tablet [Pharmacy Med Name: DICYCLOMINE 20MG  TABLETS] 180 tablet 2    Sig: TAKE 1 TABLET(20 MG) BY MOUTH EVERY 6 HOURS AS NEEDED     Gastroenterology:  Antispasmodic Agents Passed - 12/23/2018  9:05 AM      Passed - Last Heart Rate in normal  range    Pulse Readings from Last 1 Encounters:  10/30/18 100         Passed - Valid encounter within last 12 months    Recent Outpatient Visits          2 months ago On pre-exposure prophylaxis for HIV   [facility], Ranell Patrick, MD   7 months ago Routine screening for STI (sexually transmitted infection)   [facility], Ranell Patrick, MD   8 months ago Hypokalemia   [facility], Ranell Patrick, MD   8 months ago Abdominal pain, epigastric   [facility], Ranell Patrick, MD   12 months ago Dysphagia, pharyngoesophageal phase   [facility], Audrie Lia, PA-C      Future Appointments            In 2 months Carlota Raspberry Ranell Patrick, MD Primary [facility] at [place], [facility]

## 2018-12-24 NOTE — Telephone Encounter (Signed)
Controlled substance database (PDMP) reviewed. No concerns appreciated.  Last fill date alprazolam 1/23, zolpidem 1/23.

## 2019-01-14 DIAGNOSIS — M1711 Unilateral primary osteoarthritis, right knee: Secondary | ICD-10-CM | POA: Diagnosis not present

## 2019-01-14 DIAGNOSIS — K130 Diseases of lips: Secondary | ICD-10-CM | POA: Diagnosis not present

## 2019-01-14 DIAGNOSIS — J40 Bronchitis, not specified as acute or chronic: Secondary | ICD-10-CM | POA: Diagnosis not present

## 2019-01-14 DIAGNOSIS — M25461 Effusion, right knee: Secondary | ICD-10-CM | POA: Diagnosis not present

## 2019-01-15 ENCOUNTER — Other Ambulatory Visit: Payer: Self-pay | Admitting: Cardiovascular Disease

## 2019-01-17 ENCOUNTER — Other Ambulatory Visit: Payer: Self-pay | Admitting: Family Medicine

## 2019-01-17 DIAGNOSIS — F5104 Psychophysiologic insomnia: Secondary | ICD-10-CM

## 2019-01-17 DIAGNOSIS — F411 Generalized anxiety disorder: Secondary | ICD-10-CM

## 2019-01-17 DIAGNOSIS — J45909 Unspecified asthma, uncomplicated: Secondary | ICD-10-CM

## 2019-01-19 ENCOUNTER — Other Ambulatory Visit: Payer: Self-pay | Admitting: Family Medicine

## 2019-01-19 ENCOUNTER — Other Ambulatory Visit: Payer: Self-pay | Admitting: Cardiovascular Disease

## 2019-01-19 DIAGNOSIS — G43109 Migraine with aura, not intractable, without status migrainosus: Secondary | ICD-10-CM

## 2019-01-19 DIAGNOSIS — I10 Essential (primary) hypertension: Secondary | ICD-10-CM

## 2019-01-19 DIAGNOSIS — I208 Other forms of angina pectoris: Secondary | ICD-10-CM

## 2019-01-19 NOTE — Telephone Encounter (Signed)
Patient is requesting a refill of the following medications: Requested Prescriptions   Pending Prescriptions Disp Refills  . ALPRAZolam (XANAX) 1 MG tablet [Pharmacy Med Name: ALPRAZOLAM 1MG  TABLETS] 60 tablet     Sig: TAKE 1 TABLET BY MOUTH TWICE DAILY AS NEEDED  . zolpidem (AMBIEN) 10 MG tablet [Pharmacy Med Name: ZOLPIDEM 10MG  TABLETS] 90 tablet     Sig: TAKE 1 TABLET BY MOUTH AT BEDTIME   Signed Prescriptions Disp Refills  . albuterol (PROVENTIL HFA;VENTOLIN HFA) 108 (90 Base) MCG/ACT inhaler 8.5 g 0    Sig: INHALE 2 PUFFS INTO THE LUNGS EVERY 6 HOURS AS NEEDED FOR WHEEZING OR SHORTNESS OF BREATH    Authorizing Provider: [name]    Ordering User: Matilde Sprang    Date of patient request: 01/17/2019 Last office visit: 10/17/2018 Date of last refill: 12/24/2018 Last refill amount: 60. 30 Follow up time period per chart:

## 2019-01-19 NOTE — Telephone Encounter (Signed)
Requested medication (s) are due for refill today: Yes  Requested medication (s) are on the active medication list: Yes  Last refill:  12/24/18  Future visit scheduled: Yes  Notes to clinic:  Unable to refill, cannot delegate.     Requested Prescriptions  Pending Prescriptions Disp Refills   ALPRAZolam (XANAX) 1 MG tablet [Pharmacy Med Name: ALPRAZOLAM 1MG  TABLETS] 60 tablet     Sig: TAKE 1 TABLET BY MOUTH TWICE DAILY AS NEEDED     Not Delegated - Psychiatry:  Anxiolytics/Hypnotics Failed - 01/17/2019  7:09 AM      Failed - This refill cannot be delegated      Failed - Urine Drug Screen completed in last 360 days.      Passed - Valid encounter within last 6 months    Recent Outpatient Visits          3 months ago On pre-exposure prophylaxis for HIV   [facility], Ranell Patrick, MD   8 months ago Routine screening for STI (sexually transmitted infection)   [facility], Ranell Patrick, MD   9 months ago Hypokalemia   [facility], Ranell Patrick, MD   9 months ago Abdominal pain, epigastric   [facility], Ranell Patrick, MD   1 year ago Dysphagia, pharyngoesophageal phase   [facility], Audrie Lia, PA-C      Future Appointments            In 1 month Carlota Raspberry Ranell Patrick, MD Primary [facility] at [place], [place]          zolpidem (AMBIEN) 10 MG tablet [Pharmacy Med Name: ZOLPIDEM 10MG  TABLETS] 90 tablet     Sig: TAKE 1 TABLET BY MOUTH AT BEDTIME     Not Delegated - Psychiatry:  Anxiolytics/Hypnotics Failed - 01/17/2019  7:09 AM      Failed - This refill cannot be delegated      Failed - Urine Drug Screen completed in last 360 days.      Passed - Valid encounter within last 6 months    Recent Outpatient Visits          3 months ago On pre-exposure prophylaxis for HIV   [facility], Ranell Patrick, MD   8 months ago Routine screening for STI (sexually transmitted infection)   [facility], Ranell Patrick, MD   9 months ago Hypokalemia   [facility], Ranell Patrick, MD   9 months ago Abdominal pain, epigastric   [facility], Ranell Patrick, MD   1 year ago Dysphagia, pharyngoesophageal phase   [facility], Audrie Lia, PA-C      Future Appointments            In 1 month [name], MD Primary Care at [place], [facility]         Signed Prescriptions Disp Refills   albuterol (PROVENTIL HFA;VENTOLIN HFA) 108 (90 Base) MCG/ACT inhaler 8.5 g 0    Sig: INHALE 2 PUFFS INTO THE LUNGS EVERY 6 HOURS AS NEEDED FOR WHEEZING OR SHORTNESS OF BREATH     Pulmonology:  Beta Agonists Failed - 01/17/2019  7:09 AM      Failed - One inhaler should last at least one month. If the patient is requesting refills earlier, contact the patient to check for uncontrolled symptoms.      Passed - Valid  encounter within last 12 months    Recent Outpatient Visits          3 months ago On pre-exposure prophylaxis for HIV   [facility], Ranell Patrick, MD   8 months ago Routine screening for STI (sexually transmitted infection)   [facility], Ranell Patrick, MD   9 months ago Hypokalemia   [facility], Ranell Patrick, MD   9 months ago Abdominal pain, epigastric   [facility], Ranell Patrick, MD   1 year ago Dysphagia, pharyngoesophageal phase   [facility], Audrie Lia, PA-C      Future Appointments            In 1 month Carlota Raspberry Ranell Patrick, MD Primary Care at [place], [facility]

## 2019-01-19 NOTE — Telephone Encounter (Signed)
Controlled substance database (PDMP) reviewed. No concerns appreciated.  Last zolpidem and alprazolam refill 12/25/18. Refills granted for upcoming due date. 90 day of ambien.

## 2019-01-19 NOTE — Telephone Encounter (Signed)
Requested medication (s) are due for refill today: Yes  Requested medication (s) are on the active medication list: Yes  Last refill:  10/17/18  Future visit scheduled: Yes  Notes to clinic:  See request    Requested Prescriptions  Pending Prescriptions Disp Refills   rizatriptan (MAXALT-MLT) 10 MG disintegrating tablet [Pharmacy Med Name: RIZATRIPTAN ODT 10MG  TABLETS] 9 tablet 2    Sig: TAKE 1 TABLET BY MOUTH AS DIRECTED. MAY REPEAT IN 2 HOURS IF NEEDED     Neurology:  Migraine Therapy - Triptan Failed - 01/19/2019 12:28 AM      Failed - Last BP in normal range    BP Readings from Last 1 Encounters:  10/30/18 (!) 144/95         Passed - Valid encounter within last 12 months    Recent Outpatient Visits          3 months ago On pre-exposure prophylaxis for HIV   [facility], Ranell Patrick, MD   8 months ago Routine screening for STI (sexually transmitted infection)   [facility], Ranell Patrick, MD   9 months ago Hypokalemia   [facility], Ranell Patrick, MD   9 months ago Abdominal pain, epigastric   [facility] at [place], MD   1 year ago Dysphagia, pharyngoesophageal phase   [facility], Audrie Lia, PA-C      Future Appointments            In 1 month Carlota Raspberry, Ranell Patrick, MD Primary Care at [place], [facility]

## 2019-02-06 ENCOUNTER — Other Ambulatory Visit: Payer: Self-pay | Admitting: Cardiovascular Disease

## 2019-02-12 ENCOUNTER — Other Ambulatory Visit: Payer: Self-pay | Admitting: Family Medicine

## 2019-02-12 DIAGNOSIS — J45909 Unspecified asthma, uncomplicated: Secondary | ICD-10-CM

## 2019-02-26 ENCOUNTER — Other Ambulatory Visit: Payer: Self-pay | Admitting: Family Medicine

## 2019-02-26 DIAGNOSIS — E559 Vitamin D deficiency, unspecified: Secondary | ICD-10-CM | POA: Diagnosis not present

## 2019-02-26 DIAGNOSIS — R0602 Shortness of breath: Secondary | ICD-10-CM | POA: Diagnosis not present

## 2019-02-26 DIAGNOSIS — R6889 Other general symptoms and signs: Secondary | ICD-10-CM | POA: Diagnosis not present

## 2019-02-26 DIAGNOSIS — J301 Allergic rhinitis due to pollen: Secondary | ICD-10-CM | POA: Diagnosis not present

## 2019-02-26 DIAGNOSIS — F411 Generalized anxiety disorder: Secondary | ICD-10-CM

## 2019-02-26 DIAGNOSIS — J4521 Mild intermittent asthma with (acute) exacerbation: Secondary | ICD-10-CM | POA: Diagnosis not present

## 2019-02-26 NOTE — Telephone Encounter (Signed)
Controlled substance database (PDMP) reviewed. No concerns appreciated.  Last filled alprozolam 3/19.  Office visit 10/2018   Refill placed.

## 2019-02-26 NOTE — Telephone Encounter (Signed)
Requested medication (s) are due for refill today: yes  Requested medication (s) are on the active medication list: yes  Last refill:  01/19/19  Future visit scheduled: yes  Notes to clinic:  Medication not delegated to NT to refill   Requested Prescriptions  Pending Prescriptions Disp Refills   ALPRAZolam (XANAX) 1 MG tablet [Pharmacy Med Name: ALPRAZOLAM 1MG  TABLETS] 60 tablet     Sig: TAKE 1 TABLET BY MOUTH TWICE DAILY AS NEEDED     Not Delegated - Psychiatry:  Anxiolytics/Hypnotics Failed - 02/26/2019  1:10 PM      Failed - This refill cannot be delegated      Failed - Urine Drug Screen completed in last 360 days.      Passed - Valid encounter within last 6 months    Recent Outpatient Visits          4 months ago On pre-exposure prophylaxis for HIV   [facility], Ranell Patrick, MD   10 months ago Routine screening for STI (sexually transmitted infection)   [facility], Ranell Patrick, MD   10 months ago Hypokalemia   [facility] at [place], MD   10 months ago Abdominal pain, epigastric   [facility], Ranell Patrick, MD   1 year ago Dysphagia, pharyngoesophageal phase   [facility], Audrie Lia, PA-C      Future Appointments            In 2 weeks Carlota Raspberry Ranell Patrick, MD Primary Care at [place], [facility]

## 2019-03-05 ENCOUNTER — Telehealth: Payer: Self-pay | Admitting: Cardiovascular Disease

## 2019-03-05 DIAGNOSIS — I2510 Atherosclerotic heart disease of native coronary artery without angina pectoris: Secondary | ICD-10-CM | POA: Insufficient documentation

## 2019-03-05 NOTE — Progress Notes (Signed)
Virtual Visit via Video Note   This visit type was conducted due to national recommendations for restrictions regarding the COVID-19 Pandemic (e.g. social distancing) in an effort to limit this patient's exposure and mitigate transmission in our community.  Due to his co-morbid illnesses, this patient is at least at moderate risk for complications without adequate follow up.  This format is felt to be most appropriate for this patient at this time.  All issues noted in this document were discussed and addressed.  A limited physical exam was performed with this format.  Please refer to the patient's chart for his consent to telehealth for [facility].   Date:  03/06/2019   ID:  Randall Apple., DOB 08/07/1971, MRN [id number]  Patient Location: Home Provider Location: Home  PCP:  [name], MD  Cardiologist:  Sherren Mocha, MD   Electrophysiologist:  None   Evaluation Performed:  Follow-Up Visit  Chief Complaint:  FU on HTN, coronary calcification  History of Present Illness:    Randall Caspers. is a 48 y.o. male with non-obstructive coronary artery disease by coronary CTA in 04/2018, hypertension, diabetes, asthma.  He was last seen by [name] in 01/2018.    Today, he is doing well.  He had an asthma exacerbation recently and this is improved now. He did get tested for COVID-19 and his test was neg.  He has not had chest pain.  His heart rate will remain elevated for a while after exercise if his heart stays above 160 for a while.  He has not had syncope, leg swelling, paroxysmal nocturnal dyspnea.    The patient does not have symptoms concerning for COVID-19 infection (fever, chills, cough, or new shortness of breath).    Past Medical History:  Diagnosis Date  . Anxiety   . Arthritis   . Asthma   . Depression   . GERD (gastroesophageal reflux disease)   . Seasonal allergic reaction    Past Surgical History:  Procedure Laterality Date  . LIPOMA EXCISION N/A  10/30/2018   Procedure: EXCISION OF MULTIPLE SUBCUTANEOUS LIPOMAS ON TORSO;  Surgeon: Donnie Mesa, MD;  Location: [place];  Service: General;  Laterality: N/A;  . none    . none       Current Meds  Medication Sig  . acetic acid-hydrocortisone (VOSOL-HC) otic solution Place 3 drops into both ears 3 (three) times daily. Use as needed to prevent swimmer's ear  . albuterol (PROAIR HFA) 108 (90 Base) MCG/ACT inhaler INHALE 2 PUFFS INTO THE LUNGS EVERY 6 HOURS AS NEEDED FOR WHEEZING OR SHORTNESS OF BREATH  . ALPRAZolam (XANAX) 1 MG tablet TAKE 1 TABLET BY MOUTH TWICE DAILY AS NEEDED  . amLODipine (NORVASC) 5 MG tablet Take 1 tablet (5 mg total) by mouth daily.  Marland Kitchen aspirin EC 81 MG tablet Take 1 tablet (81 mg total) by mouth daily.  . benzonatate (TESSALON) 100 MG capsule Take by mouth 3 (three) times daily.  . chlorthalidone (HYGROTON) 25 MG tablet TAKE 1 TABLET(25 MG) BY MOUTH DAILY  . dicyclomine (BENTYL) 20 MG tablet Take 20 mg by mouth 2 (two) times a day.  . emtricitabine-tenofovir (TRUVADA) 200-300 MG tablet Take 1 tablet by mouth daily.  Marland Kitchen esomeprazole (NEXIUM) 40 MG capsule Take 1 capsule (40 mg total) by mouth 2 (two) times daily before a meal.  . Fexofenadine HCl (ALLEGRA PO) Take 1 tablet by mouth daily.   . fluticasone (FLONASE) 50 MCG/ACT nasal spray  INSTILL 2 SPRAYS IN EACH NOSTRIL DAILY  . Fluticasone-Salmeterol (ADVAIR) 250-50 MCG/DOSE AEPB Inhale 1 puff into the lungs 2 (two) times daily.  Marland Kitchen HYDROcodone-acetaminophen (NORCO/VICODIN) 5-325 MG tablet Take 1 tablet by mouth every 6 (six) hours as needed for moderate pain.  Marland Kitchen ipratropium (ATROVENT) 0.06 % nasal spray Place 2 sprays into the nose 2 (two) times daily.  Marland Kitchen losartan (COZAAR) 100 MG tablet TAKE 1 TABLET(100 MG) BY MOUTH DAILY  . metoprolol succinate (TOPROL XL) 25 MG 24 hr tablet Take 1 tablet (25 mg total) by mouth at bedtime.  . montelukast (SINGULAIR) 10 MG tablet TAKE 1 TABLET(10 MG) BY MOUTH AT  BEDTIME  . ondansetron (ZOFRAN-ODT) 4 MG disintegrating tablet Take 4 mg by mouth every 8 (eight) hours as needed for nausea or vomiting.  Marland Kitchen PROCTOZONE-HC 2.5 % rectal cream USE RECTALLY TWICE DAILY  . rizatriptan (MAXALT-MLT) 10 MG disintegrating tablet TAKE 1 TABLET BY MOUTH AS DIRECTED. MAY REPEAT IN 2 HOURS IF NEEDED  . testosterone cypionate (DEPOTESTOTERONE CYPIONATE) 100 MG/ML injection Inject 0.75 mg into the muscle every 14 (fourteen) days. For IM use only   . Vitamin D, Ergocalciferol, (DRISDOL) 50000 units CAPS capsule Take 50,000 Units by mouth every 7 (seven) days.  Marland Kitchen zolpidem (AMBIEN) 10 MG tablet TAKE 1 TABLET BY MOUTH AT BEDTIME  . [DISCONTINUED] dicyclomine (BENTYL) 20 MG tablet TAKE 1 TABLET(20 MG) BY MOUTH EVERY 6 HOURS AS NEEDED (Patient taking differently: Take 20 mg by mouth 2 (two) times a day. )  . [DISCONTINUED] Fluticasone-Salmeterol (ADVAIR DISKUS) 250-50 MCG/DOSE AEPB INHALE 1 PUFF INTO THE LUNGS TWICE DAILY AS NEEDED FOR ALLERGIES OR WHEEZING OR SHORTNESS OF BREATH (Patient taking differently: Inhale 1 puff into the lungs 2 (two) times a day. INHALE 1 PUFF INTO THE LUNGS TWICE DAILY AS NEEDED FOR ALLERGIES OR WHEEZING OR SHORTNESS OF BREATH)  . [DISCONTINUED] sildenafil (VIAGRA) 100 MG tablet TAKE 1 TABLET BY MOUTH ONCE DAILY AS NEEDED FOR  ERECTILE  DYSFUNCTION (Patient taking differently: Take 100 mg by mouth daily. TAKE 1 TABLET BY MOUTH ONCE DAILY AS NEEDED FOR  ERECTILE  DYSFUNCTION)  . [DISCONTINUED] sildenafil (VIAGRA) 100 MG tablet Take 100 mg by mouth daily.     Allergies:   Desloratadine; Loratadine; Levbid [hyoscyamine sulfate]; and Telithromycin   Social History   Tobacco Use  . Smoking status: Never Smoker  . Smokeless tobacco: Never Used  Substance Use Topics  . Alcohol use: Yes    Alcohol/week: 3.0 standard drinks    Types: 3 Standard drinks or equivalent per week  . Drug use: No     Family Hx: The patient's family history includes Breast cancer  in an other family member; Cancer in an other family member; Coronary artery disease in his paternal grandfather; Coronary artery disease (age of onset: 88) in his father; Heart attack in his father, maternal grandfather, and another family member; Hyperlipidemia in an other family member; Hypertension in an other family member.  ROS:   Please see the history of present illness.    All other systems reviewed and are negative.   Prior CV studies:   The following studies were reviewed today:  Coronary CTA 04/24/2018 Coronary Arteries:  Normal coronary origin.  Right dominance. RCA is a large dominant artery that gives rise to PDA and PLVB. There is minimal non-calcified plaque. Left main is a large artery that gives rise to LAD, a very small ramus intermedius and LCX arteries. Left main has no plaque. LAD is  a medium caliber vessel that gives rise to one diagonal artery. Proximal LAD has a mild mixed plaque wt the takeoff of the first diagonal artery with associated stenosis 25-50%. D1 has no significant plaque. RI is a very small artery that has no plaque. LCX is a non-dominant artery that gives rise to one large OM1 branch. There is no plaque. Other findings: Normal pulmonary vein drainage into the left atrium. Normal let atrial appendage without a thrombus. Normal size of the pulmonary artery.  IMPRESSION: 1. Coronary calcium score of 7. This was 63 percentile for age and sex matched control.  2. Normal coronary origin with right dominance.  3. 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 Aggressive risk factor modification is recommended.   Stress Echo 10/30/16 - Electrically and echocardiographically normal stress   echocardiogram without evidence for ischemia at given workload.   Excellent exercise tolerance without chest pain.  Echo 02/21/2012 Mild LVH, EF 60, normal wall motion, mild LAE, mildly reduced RV SF  Myoview 04/28/2009 EF  56, no scar or ischemia  Labs/Other Tests and Data Reviewed:    EKG:  No ECG reviewed.  Recent Labs: 04/04/2018: ALT 36; Platelets 231 04/24/2018: Magnesium 2.2 10/30/2018: BUN 16; Creatinine, Ser 1.00; Hemoglobin 18.0; Potassium 3.4; Sodium 137   Recent Lipid Panel Lab Results  Component Value Date/Time   CHOL 156 11/18/2015 04:57 PM   TRIG 176 (H) 11/18/2015 04:57 PM   HDL 31 (L) 11/18/2015 04:57 PM   CHOLHDL 5.0 11/18/2015 04:57 PM   LDLCALC 90 11/18/2015 04:57 PM     Wt Readings from Last 3 Encounters:  03/06/19 278 lb (126.1 kg)  10/30/18 268 lb 8.3 oz (121.8 kg)  10/17/18 274 lb 9.6 oz (124.6 kg)     Objective:    Vital Signs:  BP 114/80   Pulse 91   Ht 5\' 11"  (1.803 m)   Wt 278 lb (126.1 kg)   BMI 38.77 kg/m    VITAL SIGNS:  reviewed EYES:  sclerae anicteric, EOMI - Extraocular Movements Intact RESPIRATORY:  normal respiratory effort NEURO:  alert and oriented x 3, no obvious focal deficit PSYCH:  normal affect  ASSESSMENT & PLAN:    Coronary artery disease involving native coronary artery of native heart without angina pectoris Ca score of 7 by CT last year with non-obstructive plaque.  He is not having angina.  Continue ASA.  We discussed taking low dose statin for risk reduction.  He would like to consider this for now.  Follow up in 1 year.   Essential hypertension The patient's blood pressure is controlled on his current regimen.  Continue current therapy.    Erectile dysfunction, unspecified erectile dysfunction type [name] has filled sildenafil for him in the past.  He would like to switch to cialis.   -DC Sildenafil  -Rx for Cialis 20 mg prn, #10, 11 refills sent to his pharmacy  Hypokalemia K+ by last lab.  He is on chlorthalidone.  He has labs pending in a week with his PCP.  If his K is < 4, consider starting KCl 20 mEq once daily.  COVID-19 Education: The signs and symptoms of COVID-19 were discussed with the patient and how to seek  care for testing (follow up with PCP or arrange E-visit).  The importance of social distancing was discussed today.  Time:   Today, I have spent 20 minutes with the patient with telehealth technology discussing the above problems.     Medication  Adjustments/Labs and Tests Ordered: Current medicines are reviewed at length with the patient today.  Concerns regarding medicines are outlined above.   Tests Ordered: No orders of the defined types were placed in this encounter.   Medication Changes: Meds ordered this encounter  Medications  . tadalafil (CIALIS) 20 MG tablet    Sig: Take 1 tablet (20 mg total) by mouth daily as needed for erectile dysfunction.    Dispense:  10 tablet    Refill:  11    Sildenafil has been stopped    Order Specific Question:   Supervising Provider    Answer:   Lelon Perla [[id number]]    Disposition:  Follow up in 1 year(s)  Signed, [name], PA-C  03/06/2019 1:40 PM    [place]

## 2019-03-05 NOTE — Telephone Encounter (Signed)
Mr. Downum reports he got his medication refills and his bottle stated to call and make an appointment. He is overdue for March yearly visit with [name].  The patient would like to discuss medications and changing from Viagra to Cialis.  Since the patient is overdue and has medication questions, offered an e-visit with Nicki Reaper and the patient agreed. Consent below and in MyChart. Scheduled the patient for evisit with SW tomorrow. He was grateful for call and agrees with treatment plan.       Virtual Visit Pre-Appointment Phone Call TELEPHONE CALL NOTE  Sanjay Broadfoot. has been deemed a candidate for a follow-up tele-health visit to limit community exposure during the Covid-19 pandemic. I spoke with the patient via phone to ensure availability of phone/video source, confirm preferred email & phone number, and discuss instructions and expectations.  I reminded Oshea Percival. to be prepared with any vital sign and/or heart rhythm information that could potentially be obtained via home monitoring, at the time of his visit. I reminded Donnovan Stamour. to expect a phone call prior to his visit.  Theodoro Parma, RN 03/05/2019 12:36 PM     IF USING DOXIMITY or [email or web address] - The patient will receive a link just prior to their visit by text.

## 2019-03-05 NOTE — Telephone Encounter (Signed)
Pt called because he can not refill his medication without having an appt with [name].  He was wondering if [name] would refill his medication until he can be seen. Pt is comfortable with doing a virtual visit if necessary.  Pt also has to come to [facility] on Friday 05/08 to have blood work done for his PCP. He said if there is anything that [name] would like tested he can get it done at his PCP office.

## 2019-03-06 ENCOUNTER — Telehealth (INDEPENDENT_AMBULATORY_CARE_PROVIDER_SITE_OTHER): Payer: BLUE CROSS/BLUE SHIELD | Admitting: Physician Assistant

## 2019-03-06 ENCOUNTER — Encounter: Payer: Self-pay | Admitting: Physician Assistant

## 2019-03-06 ENCOUNTER — Other Ambulatory Visit: Payer: Self-pay

## 2019-03-06 VITALS — BP 114/80 | HR 91 | Ht 71.0 in | Wt 278.0 lb

## 2019-03-06 DIAGNOSIS — N529 Male erectile dysfunction, unspecified: Secondary | ICD-10-CM | POA: Diagnosis not present

## 2019-03-06 DIAGNOSIS — Z7189 Other specified counseling: Secondary | ICD-10-CM

## 2019-03-06 DIAGNOSIS — I2510 Atherosclerotic heart disease of native coronary artery without angina pectoris: Secondary | ICD-10-CM

## 2019-03-06 DIAGNOSIS — E876 Hypokalemia: Secondary | ICD-10-CM

## 2019-03-06 DIAGNOSIS — I10 Essential (primary) hypertension: Secondary | ICD-10-CM | POA: Diagnosis not present

## 2019-03-06 MED ORDER — TADALAFIL 20 MG PO TABS: 20.0000 mg | ORAL_TABLET | Freq: Every day | ORAL | 11 refills | Status: DC | PRN

## 2019-03-06 NOTE — Patient Instructions (Signed)
Medication Instructions:  Stop Viagra (Sildenafil) Start Cialis 20 mg as needed - a new prescription was sent in for you.  If you need a refill on your cardiac medications before your next appointment, please call your pharmacy.   Lab work: I will follow up on the blood work that Dr. Carlota Raspberry gets next week to see what your potassium looks like.  If you have labs (blood work) drawn today and your tests are completely normal, you will receive your results only by: Marland Kitchen MyChart Message (if you have MyChart) OR . A paper copy in the mail If you have any lab test that is abnormal or we need to change your treatment, we will call you to review the results.  Testing/Procedures: None   Follow-Up: At [facility], you and your health needs are our priority.  As part of our continuing mission to provide you with exceptional heart care, we have created designated Provider Care Teams.  These Care Teams include your primary Cardiologist (physician) and Advanced Practice Providers (APPs -  Physician Assistants and Nurse Practitioners) who all work together to provide you with the care you need, when you need it. You will need a follow up appointment in:  1 years.  Please call our office 2 months in advance to schedule this appointment.  You may see No primary care provider on file. or [name], PA-C   Any Other Special Instructions Will Be Listed Below (If Applicable).  Cholesterol medication would reduce your risk of future heart blockages and heart attacks. If you decide to start a cholesterol medication, call us.

## 2019-03-09 ENCOUNTER — Telehealth: Payer: Self-pay | Admitting: Family Medicine

## 2019-03-09 DIAGNOSIS — Z79899 Other long term (current) drug therapy: Secondary | ICD-10-CM

## 2019-03-09 DIAGNOSIS — Z113 Encounter for screening for infections with a predominantly sexual mode of transmission: Secondary | ICD-10-CM

## 2019-03-09 NOTE — Telephone Encounter (Signed)
Wants to know if he can get lab work put in for Friday because he will only be in that day in [employer]

## 2019-03-09 NOTE — Telephone Encounter (Signed)
Labs ordered. Please schedule lab visit time for Friday based on his schedule. Thanks.

## 2019-03-10 ENCOUNTER — Encounter: Payer: Self-pay | Admitting: Family Medicine

## 2019-03-11 ENCOUNTER — Other Ambulatory Visit: Payer: Self-pay | Admitting: Family Medicine

## 2019-03-11 DIAGNOSIS — J45909 Unspecified asthma, uncomplicated: Secondary | ICD-10-CM

## 2019-03-12 DIAGNOSIS — R499 Unspecified voice and resonance disorder: Secondary | ICD-10-CM | POA: Insufficient documentation

## 2019-03-13 ENCOUNTER — Telehealth (INDEPENDENT_AMBULATORY_CARE_PROVIDER_SITE_OTHER): Payer: BLUE CROSS/BLUE SHIELD | Admitting: Family Medicine

## 2019-03-13 ENCOUNTER — Other Ambulatory Visit: Payer: Self-pay

## 2019-03-13 ENCOUNTER — Encounter: Payer: Self-pay | Admitting: Family Medicine

## 2019-03-13 ENCOUNTER — Ambulatory Visit (INDEPENDENT_AMBULATORY_CARE_PROVIDER_SITE_OTHER): Payer: BLUE CROSS/BLUE SHIELD | Admitting: Family Medicine

## 2019-03-13 ENCOUNTER — Other Ambulatory Visit (HOSPITAL_COMMUNITY)
Admission: RE | Admit: 2019-03-13 | Discharge: 2019-03-13 | Disposition: A | Discharge status: Home / Self Care | Payer: BLUE CROSS/BLUE SHIELD | Source: Ambulatory Visit | Attending: Family Medicine | Admitting: Family Medicine

## 2019-03-13 VITALS — BP 119/78 | HR 89 | Temp 98.6°F | Ht 71.0 in | Wt 274.6 lb

## 2019-03-13 DIAGNOSIS — Z79899 Other long term (current) drug therapy: Secondary | ICD-10-CM

## 2019-03-13 DIAGNOSIS — J45901 Unspecified asthma with (acute) exacerbation: Secondary | ICD-10-CM

## 2019-03-13 DIAGNOSIS — F411 Generalized anxiety disorder: Secondary | ICD-10-CM

## 2019-03-13 DIAGNOSIS — I2510 Atherosclerotic heart disease of native coronary artery without angina pectoris: Secondary | ICD-10-CM | POA: Diagnosis not present

## 2019-03-13 DIAGNOSIS — Z833 Family history of diabetes mellitus: Secondary | ICD-10-CM

## 2019-03-13 DIAGNOSIS — K219 Gastro-esophageal reflux disease without esophagitis: Secondary | ICD-10-CM

## 2019-03-13 DIAGNOSIS — Z113 Encounter for screening for infections with a predominantly sexual mode of transmission: Secondary | ICD-10-CM | POA: Diagnosis not present

## 2019-03-13 DIAGNOSIS — G43909 Migraine, unspecified, not intractable, without status migrainosus: Secondary | ICD-10-CM

## 2019-03-13 DIAGNOSIS — Z131 Encounter for screening for diabetes mellitus: Secondary | ICD-10-CM | POA: Diagnosis not present

## 2019-03-13 DIAGNOSIS — Z13220 Encounter for screening for lipoid disorders: Secondary | ICD-10-CM

## 2019-03-13 DIAGNOSIS — G4700 Insomnia, unspecified: Secondary | ICD-10-CM

## 2019-03-13 DIAGNOSIS — J309 Allergic rhinitis, unspecified: Secondary | ICD-10-CM

## 2019-03-13 DIAGNOSIS — R635 Abnormal weight gain: Secondary | ICD-10-CM

## 2019-03-13 NOTE — Progress Notes (Signed)
Chronic Conditions follow up 6 months. Lab has already been drawn

## 2019-03-13 NOTE — Progress Notes (Signed)
Virtual Visit via Video Note  I connected with Randall Reed. on 03/13/19 at 1:32 PM by a video enabled telemedicine application and verified that I am speaking with the correct person using two identifiers.   I discussed the limitations, risks, security and privacy concerns of performing an evaluation and management service by telephone and the availability of in person appointments. I also discussed with the patient that there may be a patient responsible charge related to this service. The patient expressed understanding and agreed to proceed, consent obtained  Chief complaint:  Med refills, chronic condition review.   History of Present Illness: Randall Reed. is a 47 y.o. male  Preexposure prophylaxis for HIV: Has used Truvada.  STI screen obtained today. Lab Results  Component Value Date   CREATININE 1.00 10/30/2018  taking truvada once per day.  2 episodes of unprotected intercourse - no new partners. No penile d/c, fevers, or weight loss.   Cardiology: Prior cardiology - [name].nonobstucive CAD by coronary CTA (coronaray calcium score 7) prior palpitations - rare palpitations, no chest pain. Recent telemed with [name], PA=C.   - switched to Cialis,  - recommended KCL 81meq if potassium is lower than 4 on labs.   - continued ASA, and possible stain.   Allergic rhinitis: Has used Flonase (1spr/nostril daily) or Atrovent nasal spray (once per day)along with Singulair with stable control previously. Some increased allergy symptoms - congestion, watery eyes.  Using allegra QD, sudafed at times with congestion.  Asthma: See previous visits.  Different treatment regimens have been discussed but has been overall stable with as needed use of Advair.  Continued on same.  Additionally has been on Singulair 10 mg daily. Has been using Advair BID recently. Has been on 2 rounds of prednisone - March 11th and then 2 weeks ago. Negative Covid test at that time.  Proair  has been used up to every 2 hours when low oxygen few weeks ago. Has spread out proair - only used once today. On nexium BID - overall heartburn controlled.   Migraine headaches with aura: Has used Maxalt 10 mg as needed.  I discussed follow-up with headache specialist in the past, and had planned on finding name locally in [facility] where he spends most of his time. Using Maxalt 2 times per month. Sometimes has weeks in between headaches. Has not obtained name of neuro in [place] yet.   Generalized anxiety disorder: Intolerant to multiple SSRIs previously.  Have discussed overall plan to decrease Xanax as tolerated, and as of December visit was taking anywhere from 1-3 tabs per day with 60 tablets/month.  Did still require Ambien for sleep but symptoms have been stable without parasomnias. Averaging 1.5 xanax per day, usually less than 2. Some days without. Has needed up to 3 per day at times.  Taking ambien at night (not using xanax at night). No known parasomnias. No daytime somnolence. Overall meds working well.  Depression screen [facility] 2/9 03/13/2019 10/17/2018 04/24/2018 04/04/2018 12/27/2017  Decreased Interest 0 0 0 0 0  Down, Depressed, Hopeless 0 0 0 1 0  PHQ - 2 Score 0 0 0 1 0  Altered sleeping - - - 2 -  Tired, decreased energy - - - 1 -  Change in appetite - - - 2 -  Feeling bad or failure about yourself  - - - 0 -  Trouble concentrating - - - 0 -  Moving slowly or fidgety/restless - - -  0 -  Suicidal thoughts - - - 0 -  PHQ-9 Score - - - 6 -  Difficult doing work/chores - - - Somewhat difficult -  Some recent data might be hidden    IBS with history of GERD. Followed by [facility] gastroenterology, has been on Bentyl and Nexium. Mild diverticulitis in past.  appt 09/2018 with Dr. Rosalie Gums overall controlled with BID nexium.  telemed visit with GI in next 2 weeks.  Bentyl BID- has been working well.   HM: Up to date.   Endocrine: Dr. Buddy Duty. appt in June. On  testosterone for hypogonadism. Held at same dose of testosterone d/t concern about RBC.  Prior on metformin for elevated glucose?/prediabetes. . Off since 10/2018 - flared up IBS symtpoms.  Has had some weight gain. unsure if endocrine checks tsh. FH of diabetes - maternal and paternal,    Patient Active Problem List   Diagnosis Date Noted  . Coronary artery disease involving native coronary artery of native heart without angina pectoris 03/05/2019  . Irritable bowel syndrome 03/14/2016  . Asthma 11/19/2015  . 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 rotator cuff tear 10/17/2013  . Hypogonadism male 09/04/2013  . ED (erectile dysfunction) 07/30/2013  . Palpitations 02/21/2012  . BMI 33.0-33.9,adult 02/04/2012  . Allergic rhinitis 02/04/2012  . GERD (gastroesophageal reflux disease) 02/04/2012  . Migraine 02/04/2012  . GAD (generalized anxiety disorder) 02/04/2012  . Insomnia 02/04/2012  . Essential hypertension 04/13/2009  . CHEST PAIN-UNSPECIFIED 04/13/2009   Past Medical History:  Diagnosis Date  . Anxiety   . Arthritis   . Asthma   . Depression   . GERD (gastroesophageal reflux disease)   . Seasonal allergic reaction    Past Surgical History:  Procedure Laterality Date  . LIPOMA EXCISION N/A 10/30/2018   Procedure: EXCISION OF MULTIPLE SUBCUTANEOUS LIPOMAS ON TORSO;  Surgeon: Donnie Mesa, MD;  Location: [place];  Service: General;  Laterality: N/A;  . none    . none     Allergies  Allergen Reactions  . Desloratadine Other (See Comments)    CLARINEX-"severe headache"  . Loratadine Other (See Comments)    "severe headache"  . Levbid [Hyoscyamine Sulfate] Rash  . Telithromycin Rash   Prior to Admission medications   Medication Sig Start Date End Date Taking? Authorizing Provider  acetic acid-hydrocortisone (VOSOL-HC) otic solution Place 3 drops into both ears 3 (three) times daily. Use as needed to prevent swimmer's ear 01/10/15   Yes Leandrew Koyanagi, MD  albuterol [facility] HFA) 108 (90 Base) MCG/ACT inhaler INHALE 2 PUFFS INTO THE LUNGS EVERY 6 HOURS AS NEEDED FOR WHEEZING OR SHORTNESS OF BREATH 03/11/19  Yes [name], MD  ALPRAZolam [name]) 1 MG tablet TAKE 1 TABLET BY MOUTH TWICE DAILY AS NEEDED 02/26/19  Yes [name], MD  amLODipine (NORVASC) 5 MG tablet Take 1 tablet (5 mg total) by mouth daily. 04/15/18 04/10/19 Yes Sherren Mocha, MD  aspirin EC 81 MG tablet Take 1 tablet (81 mg total) by mouth daily. 03/07/15  Yes Sherren Mocha, MD  benzonatate (TESSALON) 100 MG capsule Take by mouth 3 (three) times daily.   Yes [provider]  chlorthalidone (HYGROTON) 25 MG tablet TAKE 1 TABLET(25 MG) BY MOUTH DAILY 02/09/19  Yes Sherren Mocha, MD  dicyclomine (BENTYL) 20 MG tablet Take 20 mg by mouth 2 (two) times a day.   Yes [provider]  emtricitabine-tenofovir (TRUVADA) 200-300 MG tablet Take 1 tablet by mouth daily.  10/17/18  Yes [name], MD  esomeprazole (NEXIUM) 40 MG capsule Take 1 capsule (40 mg total) by mouth 2 (two) times daily before a meal. 04/24/18  Yes [name], MD  Fexofenadine HCl (ALLEGRA PO) Take 1 tablet by mouth daily.    Yes [provider]  fluticasone (FLONASE) 50 MCG/ACT nasal spray INSTILL 2 SPRAYS IN EACH NOSTRIL DAILY 10/17/18  Yes [name], MD  Fluticasone-Salmeterol (ADVAIR) 250-50 MCG/DOSE AEPB Inhale 1 puff into the lungs 2 (two) times daily.   Yes [provider]  ipratropium (ATROVENT) 0.06 % nasal spray Place 2 sprays into the nose 2 (two) times daily. 10/17/18  Yes [name], MD  losartan (COZAAR) 100 MG tablet TAKE 1 TABLET(100 MG) BY MOUTH DAILY 10/31/18  Yes Sherren Mocha, MD  metoprolol succinate (TOPROL XL) 25 MG 24 hr tablet Take 1 tablet (25 mg total) by mouth at bedtime. 06/19/18 06/14/19 Yes Sherren Mocha, MD  montelukast (SINGULAIR) 10 MG tablet TAKE 1 TABLET(10 MG) BY MOUTH AT BEDTIME 12/02/18   Yes [name], MD  ondansetron (ZOFRAN-ODT) 4 MG disintegrating tablet Take 4 mg by mouth every 8 (eight) hours as needed for nausea or vomiting.   Yes [provider]  PROCTOZONE-HC 2.5 % rectal cream USE RECTALLY TWICE DAILY 02/06/17  Yes [name], MD  rizatriptan (MAXALT-MLT) 10 MG disintegrating tablet TAKE 1 TABLET BY MOUTH AS DIRECTED. MAY REPEAT IN 2 HOURS IF NEEDED 01/20/19  Yes [name], MD  tadalafil (CIALIS) 20 MG tablet Take 1 tablet (20 mg total) by mouth daily as needed for erectile dysfunction. 03/06/19  Yes Weaver, Scott T, PA-C  testosterone cypionate (DEPOTESTOTERONE CYPIONATE) 100 MG/ML injection Inject 0.75 mg into the muscle every 7 (seven) days. For IM use only    Yes [provider]  Vitamin D, Ergocalciferol, (DRISDOL) 50000 units CAPS capsule Take 50,000 Units by mouth every 7 (seven) days.   Yes [provider]  zolpidem (AMBIEN) 10 MG tablet TAKE 1 TABLET BY MOUTH AT BEDTIME 01/19/19  Yes [name], MD   Social History   Socioeconomic History  . Marital status: Single    Spouse name: Not on file  . Number of children: 0  . Years of education: Not on file  . Highest education level: Not on file  Occupational History  . 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 Financial resource strain: Not on file  . Food insecurity:    Worry: Not on file    Inability: Not on file  . Transportation needs:    Medical: Not on file    Non-medical: Not on file  Tobacco Use  . Smoking status: Never Smoker  . Smokeless tobacco: Never Used  Substance and Sexual Activity  . Alcohol use: Yes    Alcohol/week: 3.0 standard drinks    Types: 3 Standard drinks or equivalent per week  . Drug use: No  . Sexual activity: Yes  Lifestyle  . Physical activity:    Days per week: Not on file    Minutes per session: Not on file  . Stress: Not on file  Relationships  . Social connections:    Talks on  phone: Not on file    Gets together: Not on file    Attends religious service: Not on file    Active member of club or organization: Not on file    Attends meetings of clubs or organizations: Not on  file    Relationship status: Not on file  . Intimate partner violence:    Fear of current or ex partner: Not on file    Emotionally abused: Not on file    Physically abused: Not on file    Forced sexual activity: Not on file  Other Topics Concern  . Not on file  Social History Narrative  . Not on file    Observations/Objective: Vitals:   03/13/19 1128  BP: 119/78  Pulse: 89  Temp: 98.6 F (37 C)  TempSrc: Oral  SpO2: 95%  Weight: 274 lb 9.6 oz (124.6 kg)  Height: 5\' 11"  (1.803 m)     Assessment and Plan: Coronary artery disease involving native heart without angina pectoris, unspecified vessel or lesion type  -Followed by cardiology, asymptomatic.  Screening for hyperlipidemia - Plan: Comprehensive metabolic panel, Lipid panel  Screening for diabetes mellitus - Plan: Hemoglobin A1c Family history of diabetes mellitus - Plan: Hemoglobin A1c  Allergic rhinitis, unspecified seasonality, unspecified trigger  -Increase Flonase use, option of Xyzal or Allegra.  Option of Zaditor over-the-counter for eye symptoms.  RTC precautions if persistent.  Trigger avoidance also discussed.  Persistent asthma with acute exacerbation, unspecified asthma severity  -Continue Advair, RTC precautions if persistent/frequent use of albuterol.  Gastroesophageal reflux disease, esophagitis presence not specified  -Stable, continue routine follow-up with gastroenterology.  On pre-exposure prophylaxis for HIV  -Continue Truvada, BMP obtained.  Reassuring creatinine, STI testing normal.  Generalized anxiety disorder Insomnia, unspecified type  -Stable dosing of Xanax at this time with Ambien at bedtime.  Denies any parasomnias, increased use and no concerns on review of controlled substance  database.  Has not tolerated SSRIs previously.  Weight gain - Plan: TSH  Check TSH, and follow-up to discuss weight gain further persistent issue.  Migraine without status migrainosus, not intractable, unspecified migraine type  -Continue Maxalt, but stressed importance of follow-up with neurology, will provide name for me in [place] and can then place referral  Follow Up Instructions:  2 weeks.     Patient Instructions  I will check cholesterol levels and other labs discussed.   Increase flonase to 2 sprays per nostril, and can use atrovent a few times per day if needed. Can try xyzal in place of allegra but either is ok.  zaditor eye drops if needed for eye symptoms.  Try to minimize sudafed use. If not improving in next week or two - schedule another visit.   Continue Advair 1 puff twice per day and singulair daily for asthma, ok to use the Proair, but if that is still needed daily in next 7-10 days would recommend being seen - if worse - go to urgent care or emergency room if needed.    I will check thyroid test, but can discuss at another visit.   Let me know the name of neurologist for me to refer to discuss treatment of migraines.   Good talking to you today. Happy belated birthday.   Recheck in 2 weeks.  Return to the clinic or go to the nearest emergency room if any of your symptoms worsen or new symptoms occur.  Asthma, Adult  Asthma is a long-term (chronic) condition that causes recurrent episodes in which the airways become tight and narrow. The airways are the passages that lead from the nose and mouth down into the lungs. Asthma episodes, also called asthma attacks, can cause coughing, wheezing, shortness of breath, and chest pain. The airways can also fill with mucus.  During an attack, it can be difficult to breathe. Asthma attacks can range from minor to life threatening. Asthma cannot be cured, but medicines and lifestyle changes can help control it and treat  acute attacks. What are the causes? This condition is believed to be caused by inherited (genetic) and environmental factors, but its exact cause is not known. There are many things that can bring on an asthma attack or make asthma symptoms worse (triggers). Asthma triggers are different for each person. Common triggers include:  Mold.  Dust.  Cigarette smoke.  Cockroaches.  Things that can cause allergy symptoms (allergens), such as animal dander or pollen from trees or grass.  Air pollutants such as household cleaners, wood smoke, smog, or Advertising account planner.  Cold air, weather changes, and winds (which increase molds and pollen in the air).  Strong emotional expressions such as crying or laughing hard.  Stress.  Certain medicines (such as aspirin) or types of medicines (such as beta-blockers).  Sulfites in foods and drinks. Foods and drinks that may contain sulfites include dried fruit, potato chips, and sparkling grape juice.  Infections or inflammatory conditions such as the flu, a cold, or inflammation of the nasal membranes (rhinitis).  Gastroesophageal reflux disease (GERD).  Exercise or strenuous activity. What are the signs or symptoms? Symptoms of this condition may occur right after asthma is triggered or many hours later. Symptoms include:  Wheezing. This can sound like whistling when you breathe.  Excessive nighttime or early morning coughing.  Frequent or severe coughing with a common cold.  Chest tightness.  Shortness of breath.  Tiredness (fatigue) with minimal activity. How is this diagnosed? This condition is diagnosed based on:  Your medical history.  A physical exam.  Tests, which may include: ? Lung function studies and pulmonary studies (spirometry). These tests can evaluate the flow of air in your lungs. ? Allergy tests. ? Imaging tests, such as X-rays. How is this treated? There is no cure for this condition, but treatment can help  control your symptoms. Treatment for asthma usually involves:  Identifying and avoiding your asthma triggers.  Using medicines to control your symptoms. Generally, two types of medicines are used to treat asthma: ? Controller medicines. These help prevent asthma symptoms from occurring. They are usually taken every day. ? Fast-acting reliever or rescue medicines. These quickly relieve asthma symptoms by widening the narrow and tight airways. They are used as needed and provide short-term relief.  Using supplemental oxygen. This may be needed during a severe episode.  Using other medicines, such as: ? Allergy medicines, such as antihistamines, if your asthma attacks are triggered by allergens. ? Immune medicines (immunomodulators). These are medicines that help control the immune system.  Creating an asthma action plan. An asthma action plan is a written plan for managing and treating your asthma attacks. This plan includes: ? A list of your asthma triggers and how to avoid them. ? Information about when medicines should be taken and when their dosage should be changed. ? Instructions about using a device called a peak flow meter. A peak flow meter measures how well the lungs are working and the severity of your asthma. It helps you monitor your condition. Follow these instructions at home: Controlling your home environment Control your home environment in the following ways to help avoid triggers and prevent asthma attacks:  Change your heating and air conditioning filter regularly.  Limit your use of fireplaces and wood stoves.  Get rid of pests (such  as roaches and mice) and their droppings.  Throw away plants if you see mold on them.  Clean floors and dust surfaces regularly. Use unscented cleaning products.  Try to have someone else vacuum for you regularly. Stay out of rooms while they are being vacuumed and for a short while afterward. If you vacuum, use a dust mask from a  hardware store, a double-layered or microfilter vacuum cleaner bag, or a vacuum cleaner with a HEPA filter.  Replace carpet with wood, tile, or vinyl flooring. Carpet can trap dander and dust.  Use allergy-proof pillows, mattress covers, and box spring covers.  Keep your bedroom a trigger-free room.  Avoid pets and keep windows closed when allergens are in the air.  Wash beddings every week in hot water and dry them in a dryer.  Use blankets that are made of polyester or cotton.  Clean bathrooms and kitchens with bleach. If possible, have someone repaint the walls in these rooms with mold-resistant paint. Stay out of the rooms that are being cleaned and painted.  Wash your hands often with soap and water. If soap and water are not available, use hand sanitizer.  Do not allow anyone to smoke in your home. General instructions  Take over-the-counter and prescription medicines only as told by your health care provider. ? Speak with your health care provider if you have questions about how or when to take the medicines. ? Make note if you are requiring more frequent dosages.  Do not use any products that contain nicotine or tobacco, such as cigarettes and e-cigarettes. If you need help quitting, ask your health care provider. Also, avoid being exposed to secondhand smoke.  Use a peak flow meter as told by your health care provider. Record and keep track of the readings.  Understand and use the asthma action plan to help minimize, or stop an asthma attack, without needing to seek medical care.  Make sure you stay up to date on your yearly vaccinations as told by your health care provider. This may include vaccines for the flu and pneumonia.  Avoid outdoor activities when allergen counts are high and when air quality is low.  Wear a ski mask that covers your nose and mouth during outdoor winter activities. Exercise indoors on cold days if you can.  Warm up before exercising, and take  time for a cool-down period after exercise.  Keep all follow-up visits as told by your health care provider. This is important. Where to find more information  For information about asthma, turn to the Centers for Disease Control and Prevention at http://www.clark.net/.htm  For air quality information, turn to [employer]Now at WeightRating.nl Contact a health care provider if:  You have wheezing, shortness of breath, or a cough even while you are taking medicine to prevent attacks.  The mucus you cough up (sputum) is thicker than usual.  Your sputum changes from clear or white to yellow, green, gray, or bloody.  Your medicines are causing side effects, such as a rash, itching, swelling, or trouble breathing.  You need to use a reliever medicine more than 2-3 times a week.  Your peak flow reading is still at 50-79% of your personal best after following your action plan for 1 hour.  You have a fever. Get help right away if:  You are getting worse and do not respond to treatment during an asthma attack.  You are short of breath when at rest or when doing very little physical activity.  You  have difficulty eating, drinking, or talking.  You have chest pain or tightness.  You develop a fast heartbeat or palpitations.  You have a bluish color to your lips or fingernails.  You are light-headed or dizzy, or you faint.  Your peak flow reading is less than 50% of your personal best.  You feel too tired to breathe normally. Summary  Asthma is a long-term (chronic) condition that causes recurrent episodes in which the airways become tight and narrow. These episodes can cause coughing, wheezing, shortness of breath, and chest pain.  Asthma cannot be cured, but medicines and lifestyle changes can help control it and treat acute attacks.  Make sure you understand how to avoid triggers and how and when to use your medicines.  Asthma attacks can range from minor to life threatening.  Get help right away if you have an asthma attack and do not respond to treatment with your usual rescue medicines. This information is not intended to replace advice given to you by your health care provider. Make sure you discuss any questions you have with your health care provider. Document Released: 10/22/2005 Document Revised: 11/26/2016 Document Reviewed: 11/26/2016 Elsevier Interactive Patient Education  2019 [employer].   Allergic Rhinitis, Adult Allergic rhinitis is an allergic reaction that affects the mucous membrane inside the nose. It causes sneezing, a runny or stuffy nose, and the feeling of mucus going down the back of the throat (postnasal drip). Allergic rhinitis can be mild to severe. There are two types of allergic rhinitis:  Seasonal. This type is also called hay fever. It happens only during certain seasons.  Perennial. This type can happen at any time of the year. What are the causes? This condition happens when the body's defense system (immune system) responds to certain harmless substances called allergens as though they were germs.  Seasonal allergic rhinitis is triggered by pollen, which can come from grasses, trees, and weeds. Perennial allergic rhinitis may be caused by:  House dust mites.  Pet dander.  Mold spores. What are the signs or symptoms? Symptoms of this condition include:  Sneezing.  Runny or stuffy nose (nasal congestion).  Postnasal drip.  Itchy nose.  Tearing of the eyes.  Trouble sleeping.  Daytime sleepiness. How is this diagnosed? This condition may be diagnosed based on:  Your medical history.  A physical exam.  Tests to check for related conditions, such as: ? Asthma. ? Pink eye. ? Ear infection. ? Upper respiratory infection.  Tests to find out which allergens trigger your symptoms. These may include skin or blood tests. How is this treated? There is no cure for this condition, but treatment can help control  symptoms. Treatment may include:  Taking medicines that block allergy symptoms, such as antihistamines. Medicine may be given as a shot, nasal spray, or pill.  Avoiding the allergen.  Desensitization. This treatment involves getting ongoing shots until your body becomes less sensitive to the allergen. This treatment may be done if other treatments do not help.  If taking medicine and avoiding the allergen does not work, new, stronger medicines may be prescribed. Follow these instructions at home:  Find out what you are allergic to. Common allergens include smoke, dust, and pollen.  Avoid the things you are allergic to. These are some things you can do to help avoid allergens: ? Replace carpet with wood, tile, or vinyl flooring. Carpet can trap dander and dust. ? Do not smoke. Do not allow smoking in your home. ? Change  your heating and air conditioning filter at least once a month. ? During allergy season:  Keep windows closed as much as possible.  Plan outdoor activities when pollen counts are lowest. This is usually during the evening hours.  When coming indoors, change clothing and shower before sitting on furniture or bedding.  Take over-the-counter and prescription medicines only as told by your health care provider.  Keep all follow-up visits as told by your health care provider. This is important. Contact a health care provider if:  You have a fever.  You develop a persistent cough.  You make whistling sounds when you breathe (you wheeze).  Your symptoms interfere with your normal daily activities. Get help right away if:  You have shortness of breath. Summary  This condition can be managed by taking medicines as directed and avoiding allergens.  Contact your health care provider if you develop a persistent cough or fever.  During allergy season, keep windows closed as much as possible. This information is not intended to replace advice given to you by your  health care provider. Make sure you discuss any questions you have with your health care provider. Document Released: 07/17/2001 Document Revised: 11/29/2016 Document Reviewed: 11/29/2016 Elsevier Interactive Patient Education  2019 [employer].        I discussed the assessment and treatment plan with the patient. The patient was provided an opportunity to ask questions and all were answered. The patient agreed with the plan and demonstrated an understanding of the instructions.   The patient was advised to call back or seek an in-person evaluation if the symptoms worsen or if the condition fails to improve as anticipated.  I provided 36 minutes of non-face-to-face time during this encounter.   [name], MD

## 2019-03-13 NOTE — Patient Instructions (Addendum)
I will check cholesterol levels and other labs discussed.   Increase flonase to 2 sprays per nostril, and can use atrovent a few times per day if needed. Can try xyzal in place of allegra but either is ok.  zaditor eye drops if needed for eye symptoms.  Try to minimize sudafed use. If not improving in next week or two - schedule another visit.   Continue Advair 1 puff twice per day and singulair daily for asthma, ok to use the Proair, but if that is still needed daily in next 7-10 days would recommend being seen - if worse - go to urgent care or emergency room if needed.    I will check thyroid test, but can discuss at another visit.   Let me know the name of neurologist for me to refer to discuss treatment of migraines.   Good talking to you today. Happy belated birthday.   Recheck in 2 weeks.  Return to the clinic or go to the nearest emergency room if any of your symptoms worsen or new symptoms occur.  Asthma, Adult  Asthma is a long-term (chronic) condition that causes recurrent episodes in which the airways become tight and narrow. The airways are the passages that lead from the nose and mouth down into the lungs. Asthma episodes, also called asthma attacks, can cause coughing, wheezing, shortness of breath, and chest pain. The airways can also fill with mucus. During an attack, it can be difficult to breathe. Asthma attacks can range from minor to life threatening. Asthma cannot be cured, but medicines and lifestyle changes can help control it and treat acute attacks. What are the causes? This condition is believed to be caused by inherited (genetic) and environmental factors, but its exact cause is not known. There are many things that can bring on an asthma attack or make asthma symptoms worse (triggers). Asthma triggers are different for each person. Common triggers include:  Mold.  Dust.  Cigarette smoke.  Cockroaches.  Things that can cause allergy symptoms (allergens),  such as animal dander or pollen from trees or grass.  Air pollutants such as household cleaners, wood smoke, smog, or [employer] account planner.  Cold air, weather changes, and winds (which increase molds and pollen in the air).  Strong emotional expressions such as crying or laughing hard.  Stress.  Certain medicines (such as aspirin) or types of medicines (such as beta-blockers).  Sulfites in foods and drinks. Foods and drinks that may contain sulfites include dried fruit, potato chips, and sparkling grape juice.  Infections or inflammatory conditions such as the flu, a cold, or inflammation of the nasal membranes (rhinitis).  Gastroesophageal reflux disease (GERD).  Exercise or strenuous activity. What are the signs or symptoms? Symptoms of this condition may occur right after asthma is triggered or many hours later. Symptoms include:  Wheezing. This can sound like whistling when you breathe.  Excessive nighttime or early morning coughing.  Frequent or severe coughing with a common cold.  Chest tightness.  Shortness of breath.  Tiredness (fatigue) with minimal activity. How is this diagnosed? This condition is diagnosed based on:  Your medical history.  A physical exam.  Tests, which may include: ? Lung function studies and pulmonary studies (spirometry). These tests can evaluate the flow of air in your lungs. ? Allergy tests. ? Imaging tests, such as X-rays. How is this treated? There is no cure for this condition, but treatment can help control your symptoms. Treatment for asthma usually involves:  Identifying  and avoiding your asthma triggers.  Using medicines to control your symptoms. Generally, two types of medicines are used to treat asthma: ? Controller medicines. These help prevent asthma symptoms from occurring. They are usually taken every day. ? Fast-acting reliever or rescue medicines. These quickly relieve asthma symptoms by widening the narrow and tight  airways. They are used as needed and provide short-term relief.  Using supplemental oxygen. This may be needed during a severe episode.  Using other medicines, such as: ? Allergy medicines, such as antihistamines, if your asthma attacks are triggered by allergens. ? Immune medicines (immunomodulators). These are medicines that help control the immune system.  Creating an asthma action plan. An asthma action plan is a written plan for managing and treating your asthma attacks. This plan includes: ? A list of your asthma triggers and how to avoid them. ? Information about when medicines should be taken and when their dosage should be changed. ? Instructions about using a device called a peak flow meter. A peak flow meter measures how well the lungs are working and the severity of your asthma. It helps you monitor your condition. Follow these instructions at home: Controlling your home environment Control your home environment in the following ways to help avoid triggers and prevent asthma attacks:  Change your heating and air conditioning filter regularly.  Limit your use of fireplaces and wood stoves.  Get rid of pests (such as roaches and mice) and their droppings.  Throw away plants if you see mold on them.  Clean floors and dust surfaces regularly. Use unscented cleaning products.  Try to have someone else vacuum for you regularly. Stay out of rooms while they are being vacuumed and for a short while afterward. If you vacuum, use a dust mask from a hardware store, a double-layered or microfilter vacuum cleaner bag, or a vacuum cleaner with a HEPA filter.  Replace carpet with wood, tile, or vinyl flooring. Carpet can trap dander and dust.  Use allergy-proof pillows, mattress covers, and box spring covers.  Keep your bedroom a trigger-free room.  Avoid pets and keep windows closed when allergens are in the air.  Wash beddings every week in hot water and dry them in a dryer.  Use  blankets that are made of polyester or cotton.  Clean bathrooms and kitchens with bleach. If possible, have someone repaint the walls in these rooms with mold-resistant paint. Stay out of the rooms that are being cleaned and painted.  Wash your hands often with soap and water. If soap and water are not available, use hand sanitizer.  Do not allow anyone to smoke in your home. General instructions  Take over-the-counter and prescription medicines only as told by your health care provider. ? Speak with your health care provider if you have questions about how or when to take the medicines. ? Make note if you are requiring more frequent dosages.  Do not use any products that contain nicotine or tobacco, such as cigarettes and e-cigarettes. If you need help quitting, ask your health care provider. Also, avoid being exposed to secondhand smoke.  Use a peak flow meter as told by your health care provider. Record and keep track of the readings.  Understand and use the asthma action plan to help minimize, or stop an asthma attack, without needing to seek medical care.  Make sure you stay up to date on your yearly vaccinations as told by your health care provider. This may include vaccines for the flu  and pneumonia.  Avoid outdoor activities when allergen counts are high and when air quality is low.  Wear a ski mask that covers your nose and mouth during outdoor winter activities. Exercise indoors on cold days if you can.  Warm up before exercising, and take time for a cool-down period after exercise.  Keep all follow-up visits as told by your health care provider. This is important. Where to find more information  For information about asthma, turn to the Centers for Disease Control and Prevention at http://www.clark.net/.htm  For air quality information, turn to [employer] at WeightRating.nl Contact a health care provider if:  You have wheezing, shortness of breath, or a cough even  while you are taking medicine to prevent attacks.  The mucus you cough up (sputum) is thicker than usual.  Your sputum changes from clear or white to yellow, green, gray, or bloody.  Your medicines are causing side effects, such as a rash, itching, swelling, or trouble breathing.  You need to use a reliever medicine more than 2-3 times a week.  Your peak flow reading is still at 50-79% of your personal best after following your action plan for 1 hour.  You have a fever. Get help right away if:  You are getting worse and do not respond to treatment during an asthma attack.  You are short of breath when at rest or when doing very little physical activity.  You have difficulty eating, drinking, or talking.  You have chest pain or tightness.  You develop a fast heartbeat or palpitations.  You have a bluish color to your lips or fingernails.  You are light-headed or dizzy, or you faint.  Your peak flow reading is less than 50% of your personal best.  You feel too tired to breathe normally. Summary  Asthma is a long-term (chronic) condition that causes recurrent episodes in which the airways become tight and narrow. These episodes can cause coughing, wheezing, shortness of breath, and chest pain.  Asthma cannot be cured, but medicines and lifestyle changes can help control it and treat acute attacks.  Make sure you understand how to avoid triggers and how and when to use your medicines.  Asthma attacks can range from minor to life threatening. Get help right away if you have an asthma attack and do not respond to treatment with your usual rescue medicines. This information is not intended to replace advice given to you by your health care provider. Make sure you discuss any questions you have with your health care provider. Document Released: 10/22/2005 Document Revised: 11/26/2016 Document Reviewed: 11/26/2016 Elsevier Interactive Patient Education  2019 [employer].   Allergic Rhinitis, Adult Allergic rhinitis is an allergic reaction that affects the mucous membrane inside the nose. It causes sneezing, a runny or stuffy nose, and the feeling of mucus going down the back of the throat (postnasal drip). Allergic rhinitis can be mild to severe. There are two types of allergic rhinitis:  Seasonal. This type is also called hay fever. It happens only during certain seasons.  Perennial. This type can happen at any time of the year. What are the causes? This condition happens when the body's defense system (immune system) responds to certain harmless substances called allergens as though they were germs.  Seasonal allergic rhinitis is triggered by pollen, which can come from grasses, trees, and weeds. Perennial allergic rhinitis may be caused by:  House dust mites.  Pet dander.  Mold spores. What are the signs or symptoms? Symptoms  of this condition include:  Sneezing.  Runny or stuffy nose (nasal congestion).  Postnasal drip.  Itchy nose.  Tearing of the eyes.  Trouble sleeping.  Daytime sleepiness. How is this diagnosed? This condition may be diagnosed based on:  Your medical history.  A physical exam.  Tests to check for related conditions, such as: ? Asthma. ? Pink eye. ? Ear infection. ? Upper respiratory infection.  Tests to find out which allergens trigger your symptoms. These may include skin or blood tests. How is this treated? There is no cure for this condition, but treatment can help control symptoms. Treatment may include:  Taking medicines that block allergy symptoms, such as antihistamines. Medicine may be given as a shot, nasal spray, or pill.  Avoiding the allergen.  Desensitization. This treatment involves getting ongoing shots until your body becomes less sensitive to the allergen. This treatment may be done if other treatments do not help.  If taking medicine and avoiding the allergen does not work, new,  stronger medicines may be prescribed. Follow these instructions at home:  Find out what you are allergic to. Common allergens include smoke, dust, and pollen.  Avoid the things you are allergic to. These are some things you can do to help avoid allergens: ? Replace carpet with wood, tile, or vinyl flooring. Carpet can trap dander and dust. ? Do not smoke. Do not allow smoking in your home. ? Change your heating and air conditioning filter at least once a month. ? During allergy season:  Keep windows closed as much as possible.  Plan outdoor activities when pollen counts are lowest. This is usually during the evening hours.  When coming indoors, change clothing and shower before sitting on furniture or bedding.  Take over-the-counter and prescription medicines only as told by your health care provider.  Keep all follow-up visits as told by your health care provider. This is important. Contact a health care provider if:  You have a fever.  You develop a persistent cough.  You make whistling sounds when you breathe (you wheeze).  Your symptoms interfere with your normal daily activities. Get help right away if:  You have shortness of breath. Summary  This condition can be managed by taking medicines as directed and avoiding allergens.  Contact your health care provider if you develop a persistent cough or fever.  During allergy season, keep windows closed as much as possible. This information is not intended to replace advice given to you by your health care provider. Make sure you discuss any questions you have with your health care provider. Document Released: 07/17/2001 Document Revised: 11/29/2016 Document Reviewed: 11/29/2016 Elsevier Interactive Patient Education  2019 [employer].

## 2019-03-14 LAB — BASIC METABOLIC PANEL
BUN/Creatinine Ratio: 9 (ref 9–20)
BUN: 10 mg/dL (ref 6–24)
CO2: 22 mmol/L (ref 20–29)
Calcium: 10 mg/dL (ref 8.7–10.2)
Chloride: 99 mmol/L (ref 96–106)
Creatinine, Ser: 1.14 mg/dL (ref 0.76–1.27)
GFR calc Af Amer: 86 mL/min/{1.73_m2} (ref >59)
GFR calc non Af Amer: 75 mL/min/{1.73_m2} (ref >59)
Glucose: 89 mg/dL (ref 65–99)
Potassium: 3.8 mmol/L (ref 3.5–5.2)
Sodium: 140 mmol/L (ref 134–144)

## 2019-03-14 LAB — HIV ANTIBODY (ROUTINE TESTING W REFLEX): HIV Screen 4th Generation wRfx: NONREACTIVE

## 2019-03-14 LAB — RPR: RPR Ser Ql: NONREACTIVE

## 2019-03-14 LAB — HEMOGLOBIN A1C
Est. average glucose Bld gHb Est-mCnc: 108 mg/dL
Hgb A1c MFr Bld: 5.4 % (ref 4.8–5.6)

## 2019-03-16 LAB — GC/CHLAMYDIA PROBE AMP (~~LOC~~) NOT AT ARMC
Chlamydia: NEGATIVE
Neisseria Gonorrhea: NEGATIVE

## 2019-03-17 LAB — LIPID PANEL
Chol/HDL Ratio: 5.3 ratio — ABNORMAL HIGH (ref 0.0–5.0)
Cholesterol, Total: 174 mg/dL (ref 100–199)
HDL: 33 mg/dL — ABNORMAL LOW (ref >39)
LDL Calculated: 112 mg/dL — ABNORMAL HIGH (ref 0–99)
Triglycerides: 147 mg/dL (ref 0–149)
VLDL Cholesterol Cal: 29 mg/dL (ref 5–40)

## 2019-03-17 LAB — ALB+ALP+ALT+AST+PROT+TBILI
ALT: 41 IU/L (ref 0–44)
AST: 28 IU/L (ref 0–40)
Albumin/Globulin Ratio: 1.8 (ref 1.2–2.2)
Albumin: 4.6 g/dL (ref 4.0–5.0)
Alkaline Phosphatase: 81 IU/L (ref 39–117)
Bilirubin Total: <0.2 mg/dL (ref 0.0–1.2)
Globulin, Total: 2.5 g/dL (ref 1.5–4.5)
Total Protein: 7.1 g/dL (ref 6.0–8.5)

## 2019-03-17 LAB — SPECIMEN STATUS REPORT

## 2019-03-17 LAB — TSH: TSH: 1.4 u[IU]/mL (ref 0.450–4.500)

## 2019-03-19 ENCOUNTER — Encounter: Payer: Self-pay | Admitting: Family Medicine

## 2019-03-19 ENCOUNTER — Other Ambulatory Visit: Payer: Self-pay | Admitting: Family Medicine

## 2019-03-19 DIAGNOSIS — K580 Irritable bowel syndrome with diarrhea: Secondary | ICD-10-CM

## 2019-03-19 NOTE — Telephone Encounter (Signed)
Requested Prescriptions  Pending Prescriptions Disp Refills  . esomeprazole (NEXIUM) 40 MG capsule [Pharmacy Med Name: ESOMEPRAZOLE MAGNESIUM 40MG  DR CAPS] 180 capsule 0    Sig: TAKE 1 CAPSULE BY MOUTH TWICE DAILY BEFORE A MEAL     Gastroenterology: Proton Pump Inhibitors Passed - 03/19/2019  1:12 PM      Passed - Valid encounter within last 12 months    Recent Outpatient Visits          6 days ago On pre-exposure prophylaxis for HIV   [facility], Ranell Patrick, MD   5 months ago On pre-exposure prophylaxis for HIV   [facility], Ranell Patrick, MD   10 months ago Routine screening for STI (sexually transmitted infection)   [facility], Ranell Patrick, MD   10 months ago Hypokalemia   [facility], Ranell Patrick, MD   11 months ago Abdominal pain, epigastric   [facility], Ranell Patrick, MD

## 2019-03-20 ENCOUNTER — Telehealth: Payer: Self-pay

## 2019-03-20 ENCOUNTER — Telehealth: Payer: Self-pay | Admitting: Physician Assistant

## 2019-03-20 DIAGNOSIS — I10 Essential (primary) hypertension: Secondary | ICD-10-CM

## 2019-03-20 MED ORDER — TADALAFIL 2.5 MG PO TABS: 2.5000 mg | ORAL_TABLET | Freq: Every day | ORAL | 3 refills | Status: DC

## 2019-03-20 NOTE — Telephone Encounter (Signed)
Spoke with pt he is aware of results. Pt states that he is now taking potassium gluconate 550 mg over the counter once a day. I have scheduled him to come in for lab work on 6/12. Pt was questioning why his Cialis is prescribed as needed instead of daily. I spoke with [name] PA-C and he stated the dose he prescribed was the dose [name] prescribed in the past. Per Nicki Reaper to change Cialis to 2.5 mg once a day (take1  pill at the same time everyday) if this medication does not work we will refer pt to urology. Pt verbalized understanding and thanked me for the call.

## 2019-03-20 NOTE — Telephone Encounter (Signed)
Orders

## 2019-03-20 NOTE — Telephone Encounter (Signed)
At my last visit with Mr. Randall Reed, I agreed to review his upcoming labs with his PCP.  Reviewed recent labs from his PCP.  His potassium is normal but in the lower range.  Since he is on chlorthalidone it would be reasonable to take a potassium supplement.  Or, he could increase his dietary potassium.  PLAN:  I suggest increasing dietary potassium first and repeating a BMET in 1 month. If the potassium is still < 4 despite increasing dietary potassium, we can start a low dose of potassium supplement. Randall Dopp, PA-C    03/20/2019 8:26 AM

## 2019-03-23 NOTE — Telephone Encounter (Signed)
Good morning Dr. Carlota Raspberry,   Randall Reed has concerns with hoarseness and wanted to know if you could provide additional guidance.  I have also messaged him and asked that he go ahead and schedule the follow-up he mentioned.  See his original message for additional info re his hoarseness.  Thank you,  Randall Reed

## 2019-03-26 DIAGNOSIS — R1012 Left upper quadrant pain: Secondary | ICD-10-CM | POA: Diagnosis not present

## 2019-03-26 DIAGNOSIS — K219 Gastro-esophageal reflux disease without esophagitis: Secondary | ICD-10-CM | POA: Diagnosis not present

## 2019-03-27 ENCOUNTER — Other Ambulatory Visit: Payer: Self-pay

## 2019-03-27 ENCOUNTER — Telehealth (INDEPENDENT_AMBULATORY_CARE_PROVIDER_SITE_OTHER): Payer: BLUE CROSS/BLUE SHIELD | Admitting: Family Medicine

## 2019-03-27 VITALS — BP 114/85 | HR 101 | Temp 98.1°F | Ht 71.0 in | Wt 272.0 lb

## 2019-03-27 DIAGNOSIS — R490 Dysphonia: Secondary | ICD-10-CM

## 2019-03-27 DIAGNOSIS — J45909 Unspecified asthma, uncomplicated: Secondary | ICD-10-CM

## 2019-03-27 DIAGNOSIS — S0993XA Unspecified injury of face, initial encounter: Secondary | ICD-10-CM

## 2019-03-27 DIAGNOSIS — R05 Cough: Secondary | ICD-10-CM

## 2019-03-27 DIAGNOSIS — R059 Cough, unspecified: Secondary | ICD-10-CM

## 2019-03-27 MED ORDER — BENZONATATE 100 MG PO CAPS: 100.0000 mg | ORAL_CAPSULE | Freq: Three times a day (TID) | ORAL | 0 refills | Status: DC | PRN

## 2019-03-27 NOTE — Patient Instructions (Signed)
Hoarseness can be from multiple causes.  It is possible that recent coughing fits have contributed.  Although Advair can cause thrush I do not see any of that on your exam today.  Make sure to continue to rinse mouth with water after use of inhaler. Try Tessalon Perles up to 3 times per day for cough/coughing fits, albuterol if needed for wheezing/shortness of breath.  Salt water gargles can help sore throat.  Make sure to drink plenty of fluids.  Voice rest as much as possible.  Recheck in 7 to 10 days if symptoms or not improving, sooner if worse.  Bump on lip could be a small mucocele or other irritated area but as it has been there for a few months, can recheck that at your next in office visit.  Would like to see that within the next 1 month if possible.  Sooner if worse.  Let me know if there are questions   Hoarseness  Hoarseness, also called dysphonia, is any abnormal change in your voice that can make it difficult to speak. Your voice may sound raspy, breathy, or strained. Hoarseness is caused by a problem with your vocal cords (vocal folds). These are two bands of tissue inside your voice box (larynx). When you speak, your vocal cords move back and forth to create sound. The surfaces of your vocal cords need to be smooth for your voice to sound clear. Swelling or lumps on your vocal cords can cause hoarseness. Common causes of vocal cord problems include:  Infection in the nose, throat, and upper air passages (upper respiratory infection).  A long-term cough.  Straining or overusing your voice.  Smoking, or exposure to secondhand smoke.  Allergies.  Medication side effects.  Vocal cord growths.  Vocal cord injuries.  Stomach acids that move up in your throat and irritate your vocal cords (gastroesophageal reflux).  Diseases that affect the nervous system, such as a stroke or Parkinson's disease. Follow these instructions at home: Watch your condition for any changes. To  ease discomfort and protect your vocal cords:  Rest your voice.  Do not whisper. Whispering can cause muscle strain.  Do not speak in a loud or harsh voice.  Avoid coughing or clearing your throat.  Do not use any products that contain nicotine or tobacco, such as cigarettes and e-cigarettes. If you need help quitting, ask your health care provider.  Avoid secondhand smoke.  Do not eat foods that give you heartburn, such as spicy or acidic foods like hot peppers and orange juice. Heartburn can make gastroesophageal reflux worse.  Do not drink beverages that contain caffeine (coffee, tea, or soft drinks) or alcohol (beer, wine, or liquor).  Drink enough fluid to keep your urine pale yellow.  Use a humidifier if the air in your home is dry. If recommended by your health care provider, schedule an appointment with a speech-language specialist. This specialist may give you methods to try that can help you avoid misusing your voice. Contact a health care provider if:  You have hoarseness that lasts longer than 3 weeks.  You almost lose or completely lose your voice for more than 3 days.  You have pain when you swallow or try to talk.  You feel a lump in your neck. Get help right away if:  You have trouble swallowing.  You feel like you are choking when you swallow.  You cough up blood or vomit blood.  You have trouble breathing.  You choke, cannot swallow,  or cannot breathe if you lie flat.  You notice swelling or a rash on your body, face, or tongue. Summary  Hoarseness, also called dysphonia, is any abnormal change in your voice that can make it difficult to speak. Your voice may sound raspy, breathy, or strained.  Hoarseness is caused by a problem with your vocal cords (vocal folds).  Do not speak in a loud or harsh voice, use nicotine or tobacco products, or eat foods that give you heartburn.  If recommended by your health care provider, meet with a speech-language  specialist. This information is not intended to replace advice given to you by your health care provider. Make sure you discuss any questions you have with your health care provider. Document Released: 10/05/2005 Document Revised: 07/19/2017 Document Reviewed: 07/19/2017 Elsevier Interactive Patient Education  2019 [employer].

## 2019-03-27 NOTE — Progress Notes (Signed)
Virtual Visit via Video Note  I connected with Randall Reed. on 03/27/19 at 9:32 AM by a video enabled telemedicine application and verified that I am speaking with the correct person using two identifiers.   I discussed the limitations, risks, security and privacy concerns of performing an evaluation and management service by telephone and the availability of in person appointments. I also discussed with the patient that there may be a patient responsible charge related to this service. The patient expressed understanding and agreed to proceed, consent obtained  Chief complaint:  Sore throat, hoarse voice.  History of Present Illness: Randall Reed. is a 48 y.o. male  Most recently seen May 8 for follow-up of multiple chronic conditions.  Asthma discussed.  Had recently been on 2 rounds of prednisone on March 11 and then 2 weeks prior.  Had negative COVID testing recently by report.  Has been using pro-air up to every 2 hours a few weeks prior but has spread out need of pro-air and only used once on day of visit.  Was continued on Nexium twice daily with improvement of heartburn.  Had been using Advair twice daily recently (as needed in past).   Now with symptoms of hoarse voice, sore throat. Started 6 days ago.  No fever. Started with hoarseness in voice.  Sore throat started few days ago - thinks from straining voice. Drinking/passing fluids ok.  No mouth rash. Some mucus feeling in mouth with different taste in morning at times only - resolves with swish and brushing teeth.  Rinsing after advair.  Denies recent change in talking.  Albuterol 2-3 times per day - coughing fits.  Virtual OV with GI yesterday - no changes. Did not change meds - did not think hoarseness from GERD.  Sleeping ok.  No recent tessalon - ran out.   Small bump on lower lip for 2 months. Inside of lower lip. sometimes more agitated. No recent dental visit. Started after biting lip initially. Tx: abbreva,  Margaretann Loveless.  Patient Active Problem List   Diagnosis Date Noted  . Coronary artery disease involving native coronary artery of native heart without angina pectoris 03/05/2019  . Irritable bowel syndrome 03/14/2016  . Asthma 11/19/2015  . 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 rotator cuff tear 10/17/2013  . Hypogonadism male 09/04/2013  . ED (erectile dysfunction) 07/30/2013  . Palpitations 02/21/2012  . BMI 33.0-33.9,adult 02/04/2012  . Allergic rhinitis 02/04/2012  . GERD (gastroesophageal reflux disease) 02/04/2012  . Migraine 02/04/2012  . GAD (generalized anxiety disorder) 02/04/2012  . Insomnia 02/04/2012  . Essential hypertension 04/13/2009  . CHEST PAIN-UNSPECIFIED 04/13/2009   Past Medical History:  Diagnosis Date  . Anxiety   . Arthritis   . Asthma   . Depression   . GERD (gastroesophageal reflux disease)   . Seasonal allergic reaction    Past Surgical History:  Procedure Laterality Date  . LIPOMA EXCISION N/A 10/30/2018   Procedure: EXCISION OF MULTIPLE SUBCUTANEOUS LIPOMAS ON TORSO;  Surgeon: Donnie Mesa, MD;  Location: [facility];  Service: General;  Laterality: N/A;  . none    . none     Allergies  Allergen Reactions  . Desloratadine Other (See Comments)    CLARINEX-"severe headache"  . Loratadine Other (See Comments)    "severe headache"  . Levbid [Hyoscyamine Sulfate] Rash  . Telithromycin Rash   Prior to Admission medications   Medication Sig Start Date End Date Taking? Authorizing Provider  acetic acid-hydrocortisone (  VOSOL-HC) otic solution Place 3 drops into both ears 3 (three) times daily. Use as needed to prevent swimmer's ear 01/10/15  Yes Leandrew Koyanagi, MD  albuterol [facility] HFA) 108 (90 Base) MCG/ACT inhaler INHALE 2 PUFFS INTO THE LUNGS EVERY 6 HOURS AS NEEDED FOR WHEEZING OR SHORTNESS OF BREATH 03/11/19  Yes [name], MD  ALPRAZolam [name]) 1 MG tablet TAKE 1 TABLET BY MOUTH TWICE DAILY AS NEEDED  02/26/19  Yes [name], MD  amLODipine (NORVASC) 5 MG tablet Take 1 tablet (5 mg total) by mouth daily. 04/15/18 04/10/19 Yes Sherren Mocha, MD  aspirin EC 81 MG tablet Take 1 tablet (81 mg total) by mouth daily. 03/07/15  Yes Sherren Mocha, MD  chlorthalidone (HYGROTON) 25 MG tablet TAKE 1 TABLET(25 MG) BY MOUTH DAILY 02/09/19  Yes Sherren Mocha, MD  dicyclomine (BENTYL) 20 MG tablet Take 20 mg by mouth 2 (two) times a day.   Yes [provider]  emtricitabine-tenofovir (TRUVADA) 200-300 MG tablet Take 1 tablet by mouth daily. 10/17/18  Yes [name], MD  esomeprazole (NEXIUM) 40 MG capsule TAKE 1 CAPSULE BY MOUTH TWICE DAILY BEFORE A MEAL 03/19/19  Yes [name], MD  Fexofenadine HCl (ALLEGRA PO) Take 1 tablet by mouth daily.    Yes [provider]  fluticasone (FLONASE) 50 MCG/ACT nasal spray INSTILL 2 SPRAYS IN EACH NOSTRIL DAILY 10/17/18  Yes [name], MD  Fluticasone-Salmeterol (ADVAIR) 250-50 MCG/DOSE AEPB Inhale 1 puff into the lungs 2 (two) times daily.   Yes [provider]  ipratropium (ATROVENT) 0.06 % nasal spray Place 2 sprays into the nose 2 (two) times daily. 10/17/18  Yes [name], MD  losartan (COZAAR) 100 MG tablet TAKE 1 TABLET(100 MG) BY MOUTH DAILY 10/31/18  Yes Sherren Mocha, MD  metoprolol succinate (TOPROL XL) 25 MG 24 hr tablet Take 1 tablet (25 mg total) by mouth at bedtime. 06/19/18 06/14/19 Yes Sherren Mocha, MD  montelukast (SINGULAIR) 10 MG tablet TAKE 1 TABLET(10 MG) BY MOUTH AT BEDTIME 12/02/18  Yes [name], MD  ondansetron (ZOFRAN-ODT) 4 MG disintegrating tablet Take 4 mg by mouth every 8 (eight) hours as needed for nausea or vomiting.   Yes [provider]  PROCTOZONE-HC 2.5 % rectal cream USE RECTALLY TWICE DAILY 02/06/17  Yes [name], MD  rizatriptan (MAXALT-MLT) 10 MG disintegrating tablet TAKE 1 TABLET BY MOUTH AS DIRECTED. MAY REPEAT IN 2 HOURS IF NEEDED 01/20/19  Yes  [name], MD  Tadalafil (CIALIS) 2.5 MG TABS Take 1 tablet (2.5 mg total) by mouth daily. Take 1 pill at the same time everday 03/20/19  Yes Weaver, Nicki Reaper T, PA-C  testosterone cypionate (DEPOTESTOTERONE CYPIONATE) 100 MG/ML injection Inject 0.75 mg into the muscle every 7 (seven) days. For IM use only    Yes [provider]  Vitamin D, Ergocalciferol, (DRISDOL) 50000 units CAPS capsule Take 50,000 Units by mouth every 7 (seven) days.   Yes [provider]  zolpidem (AMBIEN) 10 MG tablet TAKE 1 TABLET BY MOUTH AT BEDTIME 01/19/19  Yes [name], MD  benzonatate (TESSALON) 100 MG capsule Take by mouth 3 (three) times daily.    [provider]   Social History   Socioeconomic History  . Marital status: Single    Spouse name: Not on file  . Number of children: 0  . Years of education: Not on file  . Highest education level: Not on file  Occupational  History  . 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 Financial resource strain: Not on file  . Food insecurity:    Worry: Not on file    Inability: Not on file  . Transportation needs:    Medical: Not on file    Non-medical: Not on file  Tobacco Use  . Smoking status: Never Smoker  . Smokeless tobacco: Never Used  Substance and Sexual Activity  . Alcohol use: Yes    Alcohol/week: 3.0 standard drinks    Types: 3 Standard drinks or equivalent per week  . Drug use: No  . Sexual activity: Yes  Lifestyle  . Physical activity:    Days per week: Not on file    Minutes per session: Not on file  . Stress: Not on file  Relationships  . Social connections:    Talks on phone: Not on file    Gets together: Not on file    Attends religious service: Not on file    Active member of club or organization: Not on file    Attends meetings of clubs or organizations: Not on file    Relationship status: Not on file  . Intimate partner violence:    Fear of current or ex  partner: Not on file    Emotionally abused: Not on file    Physically abused: Not on file    Forced sexual activity: Not on file  Other Topics Concern  . Not on file  Social History Narrative  . Not on file    Observations/Objective: Vitals:   03/27/19 0857  BP: 114/85  Pulse: (!) 101  Temp: 98.1 F (36.7 C)  Weight: 272 lb (123.4 kg)  Height: 5\' 11"  (1.803 m)  reported vitals by patient.   Lower left lip - difficult to visualize by video - no apparent ulceration.  tongue and mucus membranes clear otherwise clear.  No white patches.    Assessment and Plan: Asthma, unspecified asthma severity, unspecified whether complicated, unspecified whether persistent  Cough  Hoarseness  Injury of lip, initial encounter  Hoarseness may be from recent cough/asthma, possible GERD contribution.  No apparent signs of thrush on exam although does use Advair.  -Trial of Tessalon 100 mg 3 times daily as needed for cough/coughing fits, albuterol if needed for wheezing or asthma flare, salt water gargles and other symptomatic care for sore throat and make sure to continue to rinse with water after use of Advair.  Voice rest.  Recheck in a week to 10 days if not improving, sooner if worse  -Possible mucocele on lip, plans to recheck in office in the next month.  Follow Up Instructions:  Patient Instructions    Hoarseness can be from multiple causes.  It is possible that recent coughing fits have contributed.  Although Advair can cause thrush I do not see any of that on your exam today.  Make sure to continue to rinse mouth with water after use of inhaler. Try Tessalon Perles up to 3 times per day for cough/coughing fits, albuterol if needed for wheezing/shortness of breath.  Salt water gargles can help sore throat.  Make sure to drink plenty of fluids.  Voice rest as much as possible.  Recheck in 7 to 10 days if symptoms or not improving, sooner if worse.  Bump on lip could be a small mucocele  or other irritated area but as it has been there for a few months, can recheck that at your next in office  visit.  Would like to see that within the next 1 month if possible.  Sooner if worse.  Let me know if there are questions   Hoarseness  Hoarseness, also called dysphonia, is any abnormal change in your voice that can make it difficult to speak. Your voice may sound raspy, breathy, or strained. Hoarseness is caused by a problem with your vocal cords (vocal folds). These are two bands of tissue inside your voice box (larynx). When you speak, your vocal cords move back and forth to create sound. The surfaces of your vocal cords need to be smooth for your voice to sound clear. Swelling or lumps on your vocal cords can cause hoarseness. Common causes of vocal cord problems include:  Infection in the nose, throat, and upper air passages (upper respiratory infection).  A long-term cough.  Straining or overusing your voice.  Smoking, or exposure to secondhand smoke.  Allergies.  Medication side effects.  Vocal cord growths.  Vocal cord injuries.  Stomach acids that move up in your throat and irritate your vocal cords (gastroesophageal reflux).  Diseases that affect the nervous system, such as a stroke or Parkinson's disease. Follow these instructions at home: Watch your condition for any changes. To ease discomfort and protect your vocal cords:  Rest your voice.  Do not whisper. Whispering can cause muscle strain.  Do not speak in a loud or harsh voice.  Avoid coughing or clearing your throat.  Do not use any products that contain nicotine or tobacco, such as cigarettes and e-cigarettes. If you need help quitting, ask your health care provider.  Avoid secondhand smoke.  Do not eat foods that give you heartburn, such as spicy or acidic foods like hot peppers and orange juice. Heartburn can make gastroesophageal reflux worse.  Do not drink beverages that contain caffeine  (coffee, tea, or soft drinks) or alcohol (beer, wine, or liquor).  Drink enough fluid to keep your urine pale yellow.  Use a humidifier if the air in your home is dry. If recommended by your health care provider, schedule an appointment with a speech-language specialist. This specialist may give you methods to try that can help you avoid misusing your voice. Contact a health care provider if:  You have hoarseness that lasts longer than 3 weeks.  You almost lose or completely lose your voice for more than 3 days.  You have pain when you swallow or try to talk.  You feel a lump in your neck. Get help right away if:  You have trouble swallowing.  You feel like you are choking when you swallow.  You cough up blood or vomit blood.  You have trouble breathing.  You choke, cannot swallow, or cannot breathe if you lie flat.  You notice swelling or a rash on your body, face, or tongue. Summary  Hoarseness, also called dysphonia, is any abnormal change in your voice that can make it difficult to speak. Your voice may sound raspy, breathy, or strained.  Hoarseness is caused by a problem with your vocal cords (vocal folds).  Do not speak in a loud or harsh voice, use nicotine or tobacco products, or eat foods that give you heartburn.  If recommended by your health care provider, meet with a speech-language specialist. This information is not intended to replace advice given to you by your health care provider. Make sure you discuss any questions you have with your health care provider. Document Released: 10/05/2005 Document Revised: 07/19/2017 Document Reviewed: 07/19/2017 Elsevier Interactive  Patient Education  2019 [employer].      I discussed the assessment and treatment plan with the patient. The patient was provided an opportunity to ask questions and all were answered. The patient agreed with the plan and demonstrated an understanding of the instructions.   The patient was  advised to call back or seek an in-person evaluation if the symptoms worsen or if the condition fails to improve as anticipated.  I provided 16 minutes of non-face-to-face time during this encounter.   [name], MD

## 2019-03-27 NOTE — Progress Notes (Signed)
Chief Complaint: F/u on Asthma, X 1 week hoarse X 4 days sore throat X 2 mth sore/lump on lower lip  Pt states that he was tested for Covid sometime in April. Pt reported his vitals to me.    PHQ9- score 1  GAD 7 Questionnaire Over the last 2 weeks, how often have you been bothered by the following problems?   Not at all sure = 0   Several days = 1   Over half the days =2   Nearly every day =3  1.  Feeling nervous, anxious, or on edge -  0 2.  Not being able to stop or control worrying - 0 3.  Worrying too much about different things - 0 4.  Trouble relaxing - 1 5.  Being so restless that it's hard to sit still - 0 6.  Becoming easily annoyed or irritable - 0 7.  Feeling afraid as if something awful might happen - 0 Total Score (add your column scores) =  1  If you checked off any problems, how difficult have these made it for you to do your work, take care of things at home, or get along with other people?   Not difficult at all __________

## 2019-03-31 DIAGNOSIS — M1711 Unilateral primary osteoarthritis, right knee: Secondary | ICD-10-CM | POA: Diagnosis not present

## 2019-04-01 DIAGNOSIS — R6889 Other general symptoms and signs: Secondary | ICD-10-CM | POA: Diagnosis not present

## 2019-04-02 ENCOUNTER — Encounter: Payer: Self-pay | Admitting: Family Medicine

## 2019-04-02 DIAGNOSIS — G43909 Migraine, unspecified, not intractable, without status migrainosus: Secondary | ICD-10-CM

## 2019-04-08 NOTE — Telephone Encounter (Signed)
Neuro referral placed for headaches.   mychart message reply sent to patient.   For voice and sore throat, can schedule appt, but as aof now I do not have openings this week and already had to double book once on Friday. Can schedule with other provider next few days if needed or with me first of next week if he is improving.  Can decide on med changes if needed or specialist eval.

## 2019-04-13 ENCOUNTER — Other Ambulatory Visit: Payer: Self-pay | Admitting: Cardiovascular Disease

## 2019-04-13 ENCOUNTER — Other Ambulatory Visit: Payer: Self-pay | Admitting: Family Medicine

## 2019-04-13 DIAGNOSIS — F411 Generalized anxiety disorder: Secondary | ICD-10-CM

## 2019-04-13 DIAGNOSIS — F5104 Psychophysiologic insomnia: Secondary | ICD-10-CM

## 2019-04-13 DIAGNOSIS — J45909 Unspecified asthma, uncomplicated: Secondary | ICD-10-CM

## 2019-04-13 DIAGNOSIS — G43109 Migraine with aura, not intractable, without status migrainosus: Secondary | ICD-10-CM

## 2019-04-14 NOTE — Telephone Encounter (Signed)
Requested medication (s) are due for refill today: Zolpidem yes  Requested medication (s) are on the active medication list: yes  Last refill:  01/22/2019  Future visit scheduled: no  Notes to clinic:  Not delegated  Requested medication (s) are due for refill today: Alprazolam yes  Requested medication (s) are on the active medication list: yes  Last refill: 02/26/2019  Future visit scheduled: no  Notes to clinic: not delegated   Requested Prescriptions  Pending Prescriptions Disp Refills   zolpidem (AMBIEN) 10 MG tablet [Pharmacy Med Name: ZOLPIDEM 10MG  TABLETS] 90 tablet     Sig: TAKE 1 TABLET BY MOUTH AT BEDTIME     Not Delegated - Psychiatry:  Anxiolytics/Hypnotics Failed - 04/13/2019 10:48 PM      Failed - This refill cannot be delegated      Failed - Urine Drug Screen completed in last 360 days.      Passed - Valid encounter within last 6 months    Recent Outpatient Visits          1 month ago On pre-exposure prophylaxis for HIV   [facility], Ranell Patrick, MD   5 months ago On pre-exposure prophylaxis for HIV   [facility], Ranell Patrick, MD   11 months ago Routine screening for STI (sexually transmitted infection)   [facility], Ranell Patrick, MD   11 months ago Hypokalemia   [facility], Ranell Patrick, MD   1 year ago Abdominal pain, epigastric   [facility], Ranell Patrick, MD            ALPRAZolam [name]) 1 MG tablet [Pharmacy Med Name: ALPRAZOLAM 1MG  TABLETS] 60 tablet     Sig: TAKE 1 TABLET BY MOUTH TWICE DAILY AS NEEDED     Not Delegated - Psychiatry:  Anxiolytics/Hypnotics Failed - 04/13/2019 10:48 PM      Failed - This refill cannot be delegated      Failed - Urine Drug Screen completed in last 360 days.      Passed - Valid encounter within last 6 months    Recent Outpatient Visits          1 month ago On pre-exposure prophylaxis for HIV   [facility],  Ranell Patrick, MD   5 months ago On pre-exposure prophylaxis for HIV   [facility], Ranell Patrick, MD   11 months ago Routine screening for STI (sexually transmitted infection)   [facility], Ranell Patrick, MD   11 months ago Hypokalemia   [facility], Ranell Patrick, MD   1 year ago Abdominal pain, epigastric   [facility], Ranell Patrick, MD           Signed Prescriptions Disp Refills   rizatriptan (MAXALT-MLT) 10 MG disintegrating tablet 9 tablet 2    Sig: DISSOLVE 1 TABLET BY MOUTH AS DIRECTED, REPEAT IN 2 HOURS AS NEEDED     Neurology:  Migraine Therapy - Triptan Passed - 04/13/2019 10:48 PM      Passed - Last BP in normal range    BP Readings from Last 1 Encounters:  03/27/19 114/85         Passed - Valid encounter within last 12 months    Recent Outpatient Visits          1 month ago On pre-exposure prophylaxis for HIV  [facility] [place], MD   5 months ago On pre-exposure prophylaxis for HIV   [facility], Ranell Patrick, MD   11 months ago Routine screening for STI (sexually transmitted infection)   [facility], Ranell Patrick, MD   11 months ago Hypokalemia   [facility], Ranell Patrick, MD   1 year ago Abdominal pain, epigastric   [facility], Ranell Patrick, MD            albuterol (VENTOLIN HFA) 108 (90 Base) MCG/ACT inhaler 8.5 g 0    Sig: INHALE 2 PUFFS INTO THE LUNGS EVERY 6 HOURS AS NEEDED FOR WHEEZING OR SHORTNESS OF BREATH     Pulmonology:  Beta Agonists Failed - 04/13/2019 10:48 PM      Failed - One inhaler should last at least one month. If the patient is requesting refills earlier, contact the patient to check for uncontrolled symptoms.      Passed - Valid encounter within last 12 months    Recent Outpatient Visits          1 month ago On pre-exposure prophylaxis for HIV   [facility], Ranell Patrick, MD   5  months ago On pre-exposure prophylaxis for HIV   [facility], Ranell Patrick, MD   11 months ago Routine screening for STI (sexually transmitted infection)   [facility], Ranell Patrick, MD   11 months ago Hypokalemia   [facility], Ranell Patrick, MD   1 year ago Abdominal pain, epigastric   [facility], Ranell Patrick, MD            benzonatate (TESSALON) 100 MG capsule 20 capsule 0    Sig: TAKE 1 CAPSULE(100 MG) BY MOUTH THREE TIMES DAILY AS NEEDED FOR COUGH     Ear, Nose, and Throat:  Antitussives/Expectorants Passed - 04/13/2019 10:48 PM      Passed - Valid encounter within last 12 months    Recent Outpatient Visits          1 month ago On pre-exposure prophylaxis for HIV   [facility], Ranell Patrick, MD   5 months ago On pre-exposure prophylaxis for HIV   [facility], Ranell Patrick, MD   11 months ago Routine screening for STI (sexually transmitted infection)   [facility], Ranell Patrick, MD   11 months ago Hypokalemia   [facility], Ranell Patrick, MD   1 year ago Abdominal pain, epigastric   [facility], Ranell Patrick, MD

## 2019-04-14 NOTE — Telephone Encounter (Signed)
Patient is requesting a refill of the following medications: Requested Prescriptions   Pending Prescriptions Disp Refills  . zolpidem (AMBIEN) 10 MG tablet [Pharmacy Med Name: ZOLPIDEM 10MG  TABLETS] 90 tablet     Sig: TAKE 1 TABLET BY MOUTH AT BEDTIME  . ALPRAZolam (XANAX) 1 MG tablet [Pharmacy Med Name: ALPRAZOLAM 1MG  TABLETS] 60 tablet     Sig: TAKE 1 TABLET BY MOUTH TWICE DAILY AS NEEDED   Signed Prescriptions Disp Refills  . rizatriptan (MAXALT-MLT) 10 MG disintegrating tablet 9 tablet 2    Sig: DISSOLVE 1 TABLET BY MOUTH AS DIRECTED, REPEAT IN 2 HOURS AS NEEDED    Authorizing Provider: Carlota Raspberry, JEFFREY R    Ordering User: Sheran Spine K  . albuterol (VENTOLIN HFA) 108 (90 Base) MCG/ACT inhaler 8.5 g 0    Sig: INHALE 2 PUFFS INTO THE LUNGS EVERY 6 HOURS AS NEEDED FOR WHEEZING OR SHORTNESS OF BREATH    Authorizing Provider: Carlota Raspberry, JEFFREY R    Ordering User: Sheran Spine K  . benzonatate (TESSALON) 100 MG capsule 20 capsule 0    Sig: TAKE 1 CAPSULE(100 MG) BY MOUTH THREE TIMES DAILY AS NEEDED FOR COUGH    Authorizing Provider: Carlota Raspberry, JEFFREY R    Ordering User: Addison Naegeli   Xanax 1mg  Date of patient request: 04/13/19 Last office visit: Date of last refill: 02/26/19  Last refill amount: 60 Follow up time period per chart: 04/17/19  Patient is requesting a refill of the following medications: Requested Prescriptions   Pending Prescriptions Disp Refills  . zolpidem (AMBIEN) 10 MG tablet [Pharmacy Med Name: ZOLPIDEM 10MG  TABLETS] 90 tablet     Sig: TAKE 1 TABLET BY MOUTH AT BEDTIME  . ALPRAZolam (XANAX) 1 MG tablet [Pharmacy Med Name: ALPRAZOLAM 1MG  TABLETS] 60 tablet     Sig: TAKE 1 TABLET BY MOUTH TWICE DAILY AS NEEDED   Signed Prescriptions Disp Refills  . rizatriptan (MAXALT-MLT) 10 MG disintegrating tablet 9 tablet 2    Sig: DISSOLVE 1 TABLET BY MOUTH AS DIRECTED, REPEAT IN 2 HOURS AS NEEDED    Authorizing Provider: Carlota Raspberry, JEFFREY R    Ordering User: Sheran Spine K  . albuterol (VENTOLIN HFA) 108 (90 Base) MCG/ACT inhaler 8.5 g 0    Sig: INHALE 2 PUFFS INTO THE LUNGS EVERY 6 HOURS AS NEEDED FOR WHEEZING OR SHORTNESS OF BREATH    Authorizing Provider: Carlota Raspberry, JEFFREY R    Ordering User: Sheran Spine K  . benzonatate (TESSALON) 100 MG capsule 20 capsule 0    Sig: TAKE 1 CAPSULE(100 MG) BY MOUTH THREE TIMES DAILY AS NEEDED FOR COUGH    Authorizing Provider: [name]    Ordering User: Addison Naegeli   ambien 10 mg Date of patient request: 04/13/19 Last office visit:  Date of last refill: 01/19/19 Last refill amount: 90 Follow up time period per chart: 04/17/19

## 2019-04-14 NOTE — Telephone Encounter (Signed)
Seen Dr. Carlota Raspberry

## 2019-04-14 NOTE — Telephone Encounter (Signed)
Requested Prescriptions  Pending Prescriptions Disp Refills  . rizatriptan (MAXALT-MLT) 10 MG disintegrating tablet [Pharmacy Med Name: RIZATRIPTAN ODT 10MG  TABLETS] 9 tablet 2    Sig: DISSOLVE 1 TABLET BY MOUTH AS DIRECTED, REPEAT IN 2 HOURS AS NEEDED     Neurology:  Migraine Therapy - Triptan Passed - 04/13/2019 10:48 PM      Passed - Last BP in normal range    BP Readings from Last 1 Encounters:  03/27/19 114/85         Passed - Valid encounter within last 12 months    Recent Outpatient Visits          1 month ago On pre-exposure prophylaxis for HIV   [facility], Ranell Patrick, MD   5 months ago On pre-exposure prophylaxis for HIV   [facility], Ranell Patrick, MD   11 months ago Routine screening for STI (sexually transmitted infection)   [facility], Ranell Patrick, MD   11 months ago Hypokalemia   [facility], Ranell Patrick, MD   1 year ago Abdominal pain, epigastric   [facility], Ranell Patrick, MD           . albuterol (VENTOLIN HFA) 108 (90 Base) MCG/ACT inhaler [Pharmacy Med Name: ALBUTEROL HFA INH (200 PUFFS) 8.5GM] 8.5 g 0    Sig: INHALE 2 PUFFS INTO THE LUNGS EVERY 6 HOURS AS NEEDED FOR WHEEZING OR SHORTNESS OF BREATH     Pulmonology:  Beta Agonists Failed - 04/13/2019 10:48 PM      Failed - One inhaler should last at least one month. If the patient is requesting refills earlier, contact the patient to check for uncontrolled symptoms.      Passed - Valid encounter within last 12 months    Recent Outpatient Visits          1 month ago On pre-exposure prophylaxis for HIV   [facility], Ranell Patrick, MD   5 months ago On pre-exposure prophylaxis for HIV   [facility], Ranell Patrick, MD   11 months ago Routine screening for STI (sexually transmitted infection)   [facility], Ranell Patrick, MD   11 months ago Hypokalemia   [facility],  Ranell Patrick, MD   1 year ago Abdominal pain, epigastric   [facility], Ranell Patrick, MD           . zolpidem (AMBIEN) 10 MG tablet [Pharmacy Med Name: ZOLPIDEM 10MG  TABLETS] 90 tablet     Sig: TAKE 1 TABLET BY MOUTH AT BEDTIME     Not Delegated - Psychiatry:  Anxiolytics/Hypnotics Failed - 04/13/2019 10:48 PM      Failed - This refill cannot be delegated      Failed - Urine Drug Screen completed in last 360 days.      Passed - Valid encounter within last 6 months    Recent Outpatient Visits          1 month ago On pre-exposure prophylaxis for HIV   [facility], Ranell Patrick, MD   5 months ago On pre-exposure prophylaxis for HIV   [facility], Ranell Patrick, MD   11 months ago Routine screening for STI (sexually transmitted infection)   [facility], Ranell Patrick, MD   11 months ago Hypokalemia   [facility], Ranell Patrick, MD   1 year ago Abdominal pain, epigastric   [facility], Ranell Patrick, MD           . benzonatate (TESSALON) 100 MG capsule [Pharmacy Med Name: BENZONATATE 100MG  CAPSULES] 20 capsule 0    Sig: TAKE 1 CAPSULE(100 MG) BY MOUTH THREE TIMES DAILY AS NEEDED FOR COUGH     Ear, Nose, and Throat:  Antitussives/Expectorants Passed - 04/13/2019 10:48 PM      Passed - Valid encounter within last 12 months    Recent Outpatient Visits          1 month ago On pre-exposure prophylaxis for HIV   [facility], Ranell Patrick, MD   5 months ago On pre-exposure prophylaxis for HIV   [facility], Ranell Patrick, MD   11 months ago Routine screening for STI (sexually transmitted infection)   [facility], Ranell Patrick, MD   11 months ago Hypokalemia   [facility], Ranell Patrick, MD   1 year ago Abdominal pain, epigastric   [facility], Ranell Patrick, MD           . ALPRAZolam [name]) 1 MG tablet [Pharmacy Med Name:  ALPRAZOLAM 1MG  TABLETS] 60 tablet     Sig: TAKE 1 TABLET BY MOUTH TWICE DAILY AS NEEDED     Not Delegated - Psychiatry:  Anxiolytics/Hypnotics Failed - 04/13/2019 10:48 PM      Failed - This refill cannot be delegated      Failed - Urine Drug Screen completed in last 360 days.      Passed - Valid encounter within last 6 months    Recent Outpatient Visits          1 month ago On pre-exposure prophylaxis for HIV   [facility], Ranell Patrick, MD   5 months ago On pre-exposure prophylaxis for HIV   [facility], Ranell Patrick, MD   11 months ago Routine screening for STI (sexually transmitted infection)   [facility], Ranell Patrick, MD   11 months ago Hypokalemia   [facility], Ranell Patrick, MD   1 year ago Abdominal pain, epigastric   [facility], Ranell Patrick, MD

## 2019-04-14 NOTE — Telephone Encounter (Signed)
Controlled substance database (PDMP) reviewed. No concerns appreciated.  Zolpidem #90 on 3/19, alprazolam #60 on 4/23 Refilled.

## 2019-04-15 ENCOUNTER — Other Ambulatory Visit: Payer: Self-pay | Admitting: Physician Assistant

## 2019-04-15 ENCOUNTER — Telehealth: Payer: Self-pay | Admitting: *Deleted

## 2019-04-15 DIAGNOSIS — I10 Essential (primary) hypertension: Secondary | ICD-10-CM

## 2019-04-15 DIAGNOSIS — I208 Other forms of angina pectoris: Secondary | ICD-10-CM

## 2019-04-15 MED ORDER — AMLODIPINE BESYLATE 5 MG PO TABS: 5.0000 mg | ORAL_TABLET | Freq: Every day | ORAL | 3 refills | Status: DC

## 2019-04-15 MED ORDER — CHLORTHALIDONE 25 MG PO TABS: ORAL_TABLET | ORAL | 3 refills | Status: DC

## 2019-04-15 MED ORDER — LOSARTAN POTASSIUM 100 MG PO TABS: ORAL_TABLET | ORAL | 3 refills | Status: DC

## 2019-04-15 MED ORDER — METOPROLOL SUCCINATE ER 25 MG PO TB24: 25.0000 mg | ORAL_TABLET | Freq: Every day | ORAL | 3 refills | Status: DC

## 2019-04-15 NOTE — Telephone Encounter (Signed)
    COVID-19 Pre-Screening Questions:  . In the past 7 to 10 days have you had a cough,  shortness of breath, headache, congestion, fever (100 or greater) body aches, chills, sore throat, or sudden loss of taste or sense of smell? . Have you been around anyone with known Covid 19. . Have you been around anyone who is awaiting Covid 19 test results in the past 7 to 10 days? . Have you been around anyone who has been exposed to Covid 19, or has mentioned symptoms of Covid 19 within the past 7 to 10 days?  If you have any concerns/questions about symptoms patients report during screening (either on the phone or at threshold). Contact the provider seeing the patient or DOD for further guidance.  If neither are available contact a member of the leadership team.           Contacted patient via phone call. No to all Covid 19 questions . Has a mask for  Lab visit.kb 

## 2019-04-15 NOTE — Telephone Encounter (Signed)
Pt's medications were sent to pt's pharmacy as requested. Confirmation received.  

## 2019-04-17 ENCOUNTER — Other Ambulatory Visit: Payer: BC Managed Care – PPO | Admitting: *Deleted

## 2019-04-17 ENCOUNTER — Other Ambulatory Visit: Payer: Self-pay

## 2019-04-17 ENCOUNTER — Ambulatory Visit: Payer: BC Managed Care – PPO | Admitting: Family Medicine

## 2019-04-17 ENCOUNTER — Encounter: Payer: Self-pay | Admitting: Family Medicine

## 2019-04-17 VITALS — BP 108/69 | HR 84 | Temp 98.5°F | Resp 16 | Wt 280.6 lb

## 2019-04-17 DIAGNOSIS — I10 Essential (primary) hypertension: Secondary | ICD-10-CM

## 2019-04-17 DIAGNOSIS — J45909 Unspecified asthma, uncomplicated: Secondary | ICD-10-CM | POA: Diagnosis not present

## 2019-04-17 DIAGNOSIS — R490 Dysphonia: Secondary | ICD-10-CM | POA: Diagnosis not present

## 2019-04-17 DIAGNOSIS — K130 Diseases of lips: Secondary | ICD-10-CM

## 2019-04-17 LAB — BASIC METABOLIC PANEL
BUN/Creatinine Ratio: 11 (ref 9–20)
BUN: 13 mg/dL (ref 6–24)
CO2: 24 mmol/L (ref 20–29)
Calcium: 9.4 mg/dL (ref 8.7–10.2)
Chloride: 97 mmol/L (ref 96–106)
Creatinine, Ser: 1.22 mg/dL (ref 0.76–1.27)
GFR calc Af Amer: 79 mL/min/{1.73_m2} (ref >59)
GFR calc non Af Amer: 69 mL/min/{1.73_m2} (ref >59)
Glucose: 108 mg/dL — ABNORMAL HIGH (ref 65–99)
Potassium: 3.2 mmol/L — ABNORMAL LOW (ref 3.5–5.2)
Sodium: 138 mmol/L (ref 134–144)

## 2019-04-17 NOTE — Progress Notes (Signed)
Subjective:    Patient ID: Randall Reed., male    DOB: [birth year], 48 y.o.   MRN: [id number]  HPI Randall Reed. is a 48 y.o. male Presents today for: Chief Complaint  Patient presents with   Sore Throat    Sore throat has been going on since March. Sore throat, horeness, and throat discomfort. Had 2 virtual visit and was told to come in the office so we can take a closer look. Also has a cysat inner left lower lip    Sore throat Continue with hoarseness, has been present reportedly since March.  See previous discussions and office visits including telemedicine visits.  Reportedly had 2 separate COVID-19 testing that were both negative. I last treated him May 22, at that time had had multiple prednisone treatments for asthma.  He does take Nexium twice daily for heartburn.  Had been using his Advair twice daily.  Reported 6 weeks of hoarse voice/sore throat when discussed at May 22 visit, but clarified that sore throat is only started a few days prior. Hoarseness was discussed with gastroenterology few days before May 22 visit, and did not think hoarseness was from GERD at that time.  Hoarseness comes and goes. Sometimes beginning of day. Coffee makes better. Trouble singing. Lost voice with other upper respiratory sx's March 12, but not right since.  No recent fever.  Still on Advair BID. Had coughing spell the other day - tried atrovent instead of albuterol- felt like better.  Albuterol 3 times only in past week.  No unexplained wt loss, night sweats.  No white coating of tongue/mouth.  Only on and off soreness in throat - not persistent.   Cyst on lip: Reported 2 months of symptoms when I discussed with him on telemedicine visit May 22, mild bump on lower lip.  Symptoms more agitated.  Tried Abreva and Anbesol. Dental appt this morning to evaluate lip lesion.    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   rotator cuff tear 10/17/2013   Hypogonadism male 09/04/2013   ED (erectile dysfunction) 07/30/2013   Palpitations 02/21/2012   BMI 33.0-33.9,adult 02/04/2012   Allergic rhinitis 02/04/2012   GERD (gastroesophageal reflux disease) 02/04/2012   Migraine 02/04/2012   GAD (generalized anxiety disorder) 02/04/2012   Insomnia 02/04/2012   Essential hypertension 04/13/2009   CHEST PAIN-UNSPECIFIED 04/13/2009   Past Medical History:  Diagnosis Date   Anxiety    Arthritis    Asthma    Depression    GERD (gastroesophageal reflux disease)    Seasonal allergic reaction    Past Surgical History:  Procedure Laterality Date   LIPOMA EXCISION N/A 10/30/2018   Procedure: EXCISION OF MULTIPLE SUBCUTANEOUS LIPOMAS ON TORSO;  Surgeon: Donnie Mesa, MD;  Location: [facility];  Service: General;  Laterality: N/A;   none     none     Allergies  Allergen Reactions   Desloratadine Other (See Comments)    CLARINEX-"severe headache"   Loratadine Other (See Comments)    "severe headache"   Levbid [Hyoscyamine Sulfate] Rash   Telithromycin Rash   Prior to Admission medications   Medication Sig Start Date End Date Taking? Authorizing Provider  acetic acid-hydrocortisone (VOSOL-HC) otic solution Place 3 drops into both ears 3 (three)  times daily. Use as needed to prevent swimmer's ear 01/10/15  Yes Leandrew Koyanagi, MD  albuterol (VENTOLIN HFA) 108 (90 Base) MCG/ACT inhaler INHALE 2 PUFFS INTO THE LUNGS EVERY 6 HOURS AS NEEDED FOR WHEEZING OR SHORTNESS OF BREATH 04/14/19  Yes [name], MD  ALPRAZolam [name]) 1 MG tablet TAKE 1 TABLET BY MOUTH TWICE DAILY AS NEEDED 04/14/19  Yes [name], MD  amLODipine (NORVASC) 5 MG tablet Take 1 tablet (5 mg total) by mouth daily.  04/15/19 04/09/20 Yes Weaver, Scott T, PA-C  aspirin EC 81 MG tablet Take 1 tablet (81 mg total) by mouth daily. 03/07/15  Yes Sherren Mocha, MD  benzonatate (TESSALON) 100 MG capsule TAKE 1 CAPSULE(100 MG) BY MOUTH THREE TIMES DAILY AS NEEDED FOR COUGH 04/14/19  Yes [name], MD  chlorthalidone (HYGROTON) 25 MG tablet TAKE 1 TABLET(25 MG) BY MOUTH DAILY 04/15/19  Yes Weaver, Scott T, PA-C  dicyclomine (BENTYL) 20 MG tablet Take 20 mg by mouth 2 (two) times a day.   Yes [provider]  emtricitabine-tenofovir (TRUVADA) 200-300 MG tablet Take 1 tablet by mouth daily. 10/17/18  Yes [name], MD  esomeprazole (NEXIUM) 40 MG capsule TAKE 1 CAPSULE BY MOUTH TWICE DAILY BEFORE A MEAL 03/19/19  Yes [name], MD  Fexofenadine HCl (ALLEGRA PO) Take 1 tablet by mouth daily.    Yes [provider]  fluticasone (FLONASE) 50 MCG/ACT nasal spray INSTILL 2 SPRAYS IN EACH NOSTRIL DAILY 10/17/18  Yes [name], MD  Fluticasone-Salmeterol (ADVAIR) 250-50 MCG/DOSE AEPB Inhale 1 puff into the lungs 2 (two) times daily.   Yes [provider]  ipratropium (ATROVENT) 0.06 % nasal spray Place 2 sprays into the nose 2 (two) times daily. 10/17/18  Yes [name], MD  losartan (COZAAR) 100 MG tablet TAKE 1 TABLET(100 MG) BY MOUTH DAILY 04/15/19  Yes [name] T, PA-C  metoprolol succinate (TOPROL XL) 25 MG 24 hr tablet Take 1 tablet (25 mg total) by mouth at bedtime. 04/15/19  Yes Weaver, Scott T, PA-C  montelukast (SINGULAIR) 10 MG tablet TAKE 1 TABLET(10 MG) BY MOUTH AT BEDTIME 12/02/18  Yes [name], MD  ondansetron (ZOFRAN-ODT) 4 MG disintegrating tablet Take 4 mg by mouth every 8 (eight) hours as needed for nausea or vomiting.   Yes [provider]  PROCTOZONE-HC 2.5 % rectal cream USE RECTALLY TWICE DAILY 02/06/17  Yes [name], MD  rizatriptan (MAXALT-MLT) 10 MG disintegrating tablet DISSOLVE 1 TABLET BY MOUTH AS DIRECTED, REPEAT IN  2 HOURS AS NEEDED 04/14/19  Yes [name], MD  Tadalafil (CIALIS) 2.5 MG TABS Take 1 tablet (2.5 mg total) by mouth daily. Take 1 pill at the same time everday 03/20/19  Yes Weaver, Nicki Reaper T, PA-C  testosterone cypionate (DEPOTESTOTERONE CYPIONATE) 100 MG/ML injection Inject 0.75 mg into the muscle every 7 (seven) days. For IM use only    Yes [provider]  Vitamin D, Ergocalciferol, (DRISDOL) 50000 units CAPS capsule Take 50,000 Units by mouth every 7 (seven) days.   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)  Social Designer, fashion/clothing strain: Not on file   Food insecurity    Worry: Not on file    Inability: Not on file   Transportation needs    Medical: Not on file    Non-medical: Not on file  Tobacco Use   Smoking status: Never Smoker   Smokeless tobacco: Never Used  Substance and Sexual Activity   Alcohol use: Yes    Alcohol/week: 3.0 standard drinks    Types: 3 Standard drinks or equivalent per week   Drug use: No   Sexual activity: Yes  Lifestyle   Physical activity    Days per week: Not on file    Minutes per session: Not on file   Stress: Not on file  Relationships   Social connections    Talks on phone: Not on file    Gets together: Not on file    Attends religious service: Not on file    Active member of club or organization: Not on file    Attends meetings of clubs or organizations: Not on file    Relationship status: Not on file   Intimate partner violence    Fear of current or ex partner: Not on file    Emotionally abused: Not on file    Physically abused: Not on  file    Forced sexual activity: Not on file  Other Topics Concern   Not on file  Social History Narrative   Not on file    Review of Systems Per HPI.     Objective:   Physical Exam Vitals signs reviewed.  Constitutional:      Appearance: He is well-developed.  HENT:     Head: Normocephalic and atraumatic.     Right Ear: Tympanic membrane, ear canal and external ear normal.     Left Ear: Tympanic membrane, ear canal and external ear normal.     Nose: No rhinorrhea.     Mouth/Throat:     Mouth: Mucous membranes are moist.     Pharynx: Oropharynx is clear. No oropharyngeal exudate or posterior oropharyngeal erythema.     Comments: Small lesion left lower lip internal mucus membrane, white appearance. No cystic structure seen.  Eyes:     Conjunctiva/sclera: Conjunctivae normal.     Pupils: Pupils are equal, round, and reactive to light.  Neck:     Musculoskeletal: Neck supple.  Cardiovascular:     Rate and Rhythm: Normal rate and regular rhythm.     Heart sounds: Normal heart sounds. No murmur.  Pulmonary:     Effort: Pulmonary effort is normal.     Breath sounds: Normal breath sounds. No wheezing, rhonchi or rales.  Abdominal:     Palpations: Abdomen is soft.     Tenderness: There is no abdominal tenderness.  Lymphadenopathy:     Cervical: No cervical adenopathy.  Skin:    General: Skin is warm and dry.     Findings: No rash.  Neurological:     Mental Status: He is alert and oriented to person, place, and time.  Psychiatric:        Behavior: Behavior normal.    Vitals:   04/17/19 0920  BP: 108/69  Pulse: 84  Resp: 16  Temp: 98.5 F (36.9 C)  TempSrc: Oral  SpO2: 96%  Weight: 280 lb 9.6 oz (127.3 kg)       Assessment & Plan:   Leroi Haque. is a 48 y.o.  male Hoarseness  -Intermittent but persistent symptoms for 3 months.  Suspected postviral initially.  No sign of thrush seen on exam at present but in differential with use of inhaled  corticosteroid.  -Refer to ear nose and throat, he will call or send message on preferred provider in [place].  Lip lesion  -Suspected scar tissue from biting lip.  Will evaluate at dentist and then if referral needed to oral surgeon, can provide one.  Uncomplicated asthma, unspecified asthma severity, unspecified whether persistent  -Continue same regimen for now, follow-up in 1 month to decide on medication changes, sooner if worse.  No orders of the defined types were placed in this encounter.  Patient Instructions   I will refer you to ear nose and throat specialist - please let me know the name of where you would like to be seen in [place].   Salt water gargles, fluids throughout the day to help with hoarseness.  Voice rest as much as possible including singing for now.  Follow-up with dentist as planned today,but if a referral is needed to an oral surgeon for removal of area, let me know.  No change in asthma treatment for now. follow-up in 1 month to review status.  Sooner if worsening symptoms.   Return to the clinic or go to the nearest emergency room if any of your symptoms worsen or new symptoms occur.    Hoarseness  Hoarseness, also called dysphonia, is any abnormal change in your voice that can make it difficult to speak. Your voice may sound raspy, breathy, or strained. Hoarseness is caused by a problem with your vocal cords (vocal folds). These are two bands of tissue inside your voice box (larynx). When you speak, your vocal cords move back and forth to create sound. The surfaces of your vocal cords need to be smooth for your voice to sound clear. Swelling or lumps on your vocal cords can cause hoarseness. Common causes of vocal cord problems include:  Infection in the nose, throat, and upper air passages (upper respiratory infection).  A long-term cough.  Straining or overusing your voice.  Smoking, or exposure to secondhand smoke.  Allergies.  Medication  side effects.  Vocal cord growths.  Vocal cord injuries.  Stomach acids that move up in your throat and irritate your vocal cords (gastroesophageal reflux).  Diseases that affect the nervous system, such as a stroke or Parkinson's disease. Follow these instructions at home: Watch your condition for any changes. To ease discomfort and protect your vocal cords:  Rest your voice.  Do not whisper. Whispering can cause muscle strain.  Do not speak in a loud or harsh voice.  Avoid coughing or clearing your throat.  Do not use any products that contain nicotine or tobacco, such as cigarettes and e-cigarettes. If you need help quitting, ask your health care provider.  Avoid secondhand smoke.  Do not eat foods that give you heartburn, such as spicy or acidic foods like hot peppers and orange juice. Heartburn can make gastroesophageal reflux worse.  Do not drink beverages that contain caffeine (coffee, tea, or soft drinks) or alcohol (beer, wine, or liquor).  Drink enough fluid to keep your urine pale yellow.  Use a humidifier if the air in your home is dry. If recommended by your health care provider, schedule an appointment with a speech-language specialist. This specialist may give you methods to try that can help you avoid misusing your voice. Contact a health care provider if:  You have hoarseness  that lasts longer than 3 weeks.  You almost lose or completely lose your voice for more than 3 days.  You have pain when you swallow or try to talk.  You feel a lump in your neck. Get help right away if:  You have trouble swallowing.  You feel like you are choking when you swallow.  You cough up blood or vomit blood.  You have trouble breathing.  You choke, cannot swallow, or cannot breathe if you lie flat.  You notice swelling or a rash on your body, face, or tongue. Summary  Hoarseness, also called dysphonia, is any abnormal change in your voice that can make it  difficult to speak. Your voice may sound raspy, breathy, or strained.  Hoarseness is caused by a problem with your vocal cords (vocal folds).  Do not speak in a loud or harsh voice, use nicotine or tobacco products, or eat foods that give you heartburn.  If recommended by your health care provider, meet with a speech-language specialist. This information is not intended to replace advice given to you by your health care provider. Make sure you discuss any questions you have with your health care provider. Document Released: 10/05/2005 Document Revised: 07/19/2017 Document Reviewed: 07/19/2017 Elsevier Interactive Patient Education  [employer].   If you have lab work done today you will be contacted with your lab results within the next 2 weeks.  If you have not heard from [place] then please contact us. The fastest way to get your results is to register for My Chart.   IF you received an x-ray today, you will receive an invoice from [facility]. Please contact [facility] at [phone] with questions or concerns regarding your invoice.   IF you received labwork today, you will receive an invoice from [facility]. Please contact [facility] at [phone] with questions or concerns regarding your invoice.   Our billing staff will not be able to assist you with questions regarding bills from these companies.  You will be contacted with the lab results as soon as they are available. The fastest way to get your results is to activate your My Chart account. Instructions are located on the last page of this paperwork. If you have not heard from [place] regarding the results in 2 weeks, please contact this office.       Signed,   [name], MD [facility].  04/18/19 9:35 AM

## 2019-04-17 NOTE — Patient Instructions (Addendum)
I will refer you to ear nose and throat specialist - please let me know the name of where you would like to be seen in [place].   Salt water gargles, fluids throughout the day to help with hoarseness.  Voice rest as much as possible including singing for now.  Follow-up with dentist as planned today,but if a referral is needed to an oral surgeon for removal of area, let me know.  No change in asthma treatment for now. follow-up in 1 month to review status.  Sooner if worsening symptoms.   Return to the clinic or go to the nearest emergency room if any of your symptoms worsen or new symptoms occur.    Hoarseness  Hoarseness, also called dysphonia, is any abnormal change in your voice that can make it difficult to speak. Your voice may sound raspy, breathy, or strained. Hoarseness is caused by a problem with your vocal cords (vocal folds). These are two bands of tissue inside your voice box (larynx). When you speak, your vocal cords move back and forth to create sound. The surfaces of your vocal cords need to be smooth for your voice to sound clear. Swelling or lumps on your vocal cords can cause hoarseness. Common causes of vocal cord problems include:  Infection in the nose, throat, and upper air passages (upper respiratory infection).  A long-term cough.  Straining or overusing your voice.  Smoking, or exposure to secondhand smoke.  Allergies.  Medication side effects.  Vocal cord growths.  Vocal cord injuries.  Stomach acids that move up in your throat and irritate your vocal cords (gastroesophageal reflux).  Diseases that affect the nervous system, such as a stroke or Parkinson's disease. Follow these instructions at home: Watch your condition for any changes. To ease discomfort and protect your vocal cords:  Rest your voice.  Do not whisper. Whispering can cause muscle strain.  Do not speak in a loud or harsh voice.  Avoid coughing or clearing your throat.  Do not  use any products that contain nicotine or tobacco, such as cigarettes and e-cigarettes. If you need help quitting, ask your health care provider.  Avoid secondhand smoke.  Do not eat foods that give you heartburn, such as spicy or acidic foods like hot peppers and orange juice. Heartburn can make gastroesophageal reflux worse.  Do not drink beverages that contain caffeine (coffee, tea, or soft drinks) or alcohol (beer, wine, or liquor).  Drink enough fluid to keep your urine pale yellow.  Use a humidifier if the air in your home is dry. If recommended by your health care provider, schedule an appointment with a speech-language specialist. This specialist may give you methods to try that can help you avoid misusing your voice. Contact a health care provider if:  You have hoarseness that lasts longer than 3 weeks.  You almost lose or completely lose your voice for more than 3 days.  You have pain when you swallow or try to talk.  You feel a lump in your neck. Get help right away if:  You have trouble swallowing.  You feel like you are choking when you swallow.  You cough up blood or vomit blood.  You have trouble breathing.  You choke, cannot swallow, or cannot breathe if you lie flat.  You notice swelling or a rash on your body, face, or tongue. Summary  Hoarseness, also called dysphonia, is any abnormal change in your voice that can make it difficult to speak. Your voice may sound  raspy, breathy, or strained.  Hoarseness is caused by a problem with your vocal cords (vocal folds).  Do not speak in a loud or harsh voice, use nicotine or tobacco products, or eat foods that give you heartburn.  If recommended by your health care provider, meet with a speech-language specialist. This information is not intended to replace advice given to you by your health care provider. Make sure you discuss any questions you have with your health care provider. Document Released: 10/05/2005  Document Revised: 07/19/2017 Document Reviewed: 07/19/2017 Elsevier Interactive Patient Education  [employer].   If you have lab work done today you will be contacted with your lab results within the next 2 weeks.  If you have not heard from [place] then please contact us. The fastest way to get your results is to register for My Chart.   IF you received an x-ray today, you will receive an invoice from [facility]. Please contact [facility] at [phone] with questions or concerns regarding your invoice.   IF you received labwork today, you will receive an invoice from [facility]. Please contact [facility] at [phone] with questions or concerns regarding your invoice.   Our billing staff will not be able to assist you with questions regarding bills from these companies.  You will be contacted with the lab results as soon as they are available. The fastest way to get your results is to activate your My Chart account. Instructions are located on the last page of this paperwork. If you have not heard from [place] regarding the results in 2 weeks, please contact this office.

## 2019-04-18 ENCOUNTER — Encounter: Payer: Self-pay | Admitting: Family Medicine

## 2019-04-20 ENCOUNTER — Telehealth: Payer: Self-pay | Admitting: *Deleted

## 2019-04-20 MED ORDER — POTASSIUM CHLORIDE CRYS ER 20 MEQ PO TBCR: 20.0000 meq | EXTENDED_RELEASE_TABLET | Freq: Two times a day (BID) | ORAL | 3 refills | Status: DC

## 2019-04-20 NOTE — Telephone Encounter (Signed)
Pt has been advised of lab results and recommendations by phone . Patient notified of result.  Please refer to phone note from today for complete details.   Julaine Hua, [facility] 04/20/2019 2:06 PM   Pt request new Rx for KDur be sent to [facility] in [place]. Pt is aware to d/c OTC K+ and start Rx K+. I have sent in KDur 20 meq BID # 180 x 3. In reference to having repeat labs pt did ask if he could have lab done with his PCP in [place]. Pt states he has a PCP in [place] but he will not be in [place] in the next 7-10 days when repeat labs are needed. I stated to the pt that will be fine and I will send a message to [name], [facility] and his CMA Shana O. To send an Rx for labs to DR. Ashby [place], [place]; phone # [phone], pt did not have a fax #. I advised pt to have the lab work done the week of 6/22-26 in [place], and the lab work is not fasting so it is fine if he eats before lab work is done. Pt thanked me for the help and the call.

## 2019-04-20 NOTE — Telephone Encounter (Signed)
Edwena Blow I can sign a Rx order on Friday when I am in the office and we can fax to his PCP. Can you call their office and get a fax #? Thanks, [name], PA-C    04/20/2019 6:02 PM

## 2019-05-08 ENCOUNTER — Other Ambulatory Visit: Payer: Self-pay | Admitting: Family Medicine

## 2019-05-08 DIAGNOSIS — F411 Generalized anxiety disorder: Secondary | ICD-10-CM

## 2019-05-08 NOTE — Telephone Encounter (Signed)
Requested medication (s) are due for refill today: Alprazolam, yes  Requested medication (s) are on the active medication list:  yes  Last refill:  6/10/220  Future visit scheduled: no  Notes to clinic:  Not delegated   Requested medication (s) are due for refill today: Benzonatate, yes  Requested medication (s) are on the active medication list: yes  Last refill: 04/14/2019  Future visit scheduled: no  Notes to clinic: follow up visit needed  Requested Prescriptions  Pending Prescriptions Disp Refills   ALPRAZolam (XANAX) 1 MG tablet [Pharmacy Med Name: ALPRAZOLAM 1MG  TABLETS] 60 tablet     Sig: TAKE 1 TABLET BY MOUTH TWICE DAILY AS NEEDED     Not Delegated - Psychiatry:  Anxiolytics/Hypnotics Failed - 05/08/2019 12:22 PM      Failed - This refill cannot be delegated      Failed - Urine Drug Screen completed in last 360 days.      Passed - Valid encounter within last 6 months    Recent Outpatient Visits          3 weeks ago Hoarseness   [facility], Ranell Patrick, MD   1 month ago On pre-exposure prophylaxis for HIV   [facility], Ranell Patrick, MD   6 months ago On pre-exposure prophylaxis for HIV   [facility], Ranell Patrick, MD   1 year ago Routine screening for STI (sexually transmitted infection)   [facility], Ranell Patrick, MD   1 year ago Hypokalemia   [facility], Ranell Patrick, MD              benzonatate (TESSALON) 100 MG capsule [Pharmacy Med Name: BENZONATATE 100MG  CAPSULES] 20 capsule 0    Sig: TAKE 1 CAPSULE(100 MG) BY MOUTH THREE TIMES DAILY AS NEEDED FOR COUGH     Ear, Nose, and Throat:  Antitussives/Expectorants Passed - 05/08/2019 12:22 PM      Passed - Valid encounter within last 12 months    Recent Outpatient Visits          3 weeks ago Hoarseness   [facility], Ranell Patrick, MD   1 month ago On pre-exposure prophylaxis for HIV   [facility], Ranell Patrick, MD   6 months ago On pre-exposure prophylaxis for HIV   [facility], Ranell Patrick, MD   1 year ago Routine screening for STI (sexually transmitted infection)   [facility], Ranell Patrick, MD   1 year ago Hypokalemia   [facility], Ranell Patrick, MD

## 2019-05-09 ENCOUNTER — Other Ambulatory Visit: Payer: Self-pay | Admitting: Family Medicine

## 2019-05-09 DIAGNOSIS — J45909 Unspecified asthma, uncomplicated: Secondary | ICD-10-CM

## 2019-05-09 NOTE — Telephone Encounter (Signed)
Requested Prescriptions  Pending Prescriptions Disp Refills  . albuterol (VENTOLIN HFA) 108 (90 Base) MCG/ACT inhaler [Pharmacy Med Name: ALBUTEROL HFA INH (200 PUFFS) 8.5GM] 8.5 g 0    Sig: INHALE 2 PUFFS INTO THE LUNGS EVERY 6 HOURS AS NEEDED FOR WHEEZING OR SHORTNESS OF BREATH     Pulmonology:  Beta Agonists Failed - 05/09/2019 11:42 AM      Failed - One inhaler should last at least one month. If the patient is requesting refills earlier, contact the patient to check for uncontrolled symptoms.      Passed - Valid encounter within last 12 months    Recent Outpatient Visits          3 weeks ago Hoarseness   [facility], Ranell Patrick, MD   1 month ago On pre-exposure prophylaxis for HIV   [facility], Ranell Patrick, MD   6 months ago On pre-exposure prophylaxis for HIV   [facility], Ranell Patrick, MD   1 year ago Routine screening for STI (sexually transmitted infection)   [facility], Ranell Patrick, MD   1 year ago Hypokalemia   [facility], Ranell Patrick, MD

## 2019-05-11 ENCOUNTER — Telehealth: Payer: Self-pay

## 2019-05-11 DIAGNOSIS — I10 Essential (primary) hypertension: Secondary | ICD-10-CM

## 2019-05-11 DIAGNOSIS — E876 Hypokalemia: Secondary | ICD-10-CM

## 2019-05-11 NOTE — Telephone Encounter (Signed)
Lab order for repeat BMET faxed to Dr. Herbert Seta [phone] per pt request to have drawn at his 05/12/19 appt.

## 2019-05-12 DIAGNOSIS — R002 Palpitations: Secondary | ICD-10-CM | POA: Diagnosis not present

## 2019-05-12 DIAGNOSIS — R21 Rash and other nonspecific skin eruption: Secondary | ICD-10-CM | POA: Diagnosis not present

## 2019-05-12 DIAGNOSIS — E876 Hypokalemia: Secondary | ICD-10-CM | POA: Diagnosis not present

## 2019-05-12 NOTE — Telephone Encounter (Signed)
Controlled substance database (PDMP) reviewed. No concerns appreciated.  Last filled alprazolam June 10th.  Reorder placed with 1 refill

## 2019-05-18 MED ORDER — TADALAFIL 20 MG PO TABS: 20.0000 mg | ORAL_TABLET | Freq: Every day | ORAL | 11 refills | Status: DC | PRN

## 2019-05-18 NOTE — Addendum Note (Signed)
Addended byKathlen Mody, Nicki Reaper T on: 05/18/2019 07:25 AM   Modules accepted: Orders

## 2019-05-18 NOTE — Telephone Encounter (Signed)
Edwena Blow Can you call Mr. Spadafore and make sure his Cialis was sent in correctly? He wanted to switch from 5 mg daily to 20 mg as needed.  I canceled the 5 mg Rx.  I resubmitted 20 daily as needed to [facility] in [place]. Thanks, [name], PA-C    05/18/2019 7:25 AM

## 2019-05-25 ENCOUNTER — Other Ambulatory Visit: Payer: Self-pay | Admitting: Family Medicine

## 2019-05-25 DIAGNOSIS — J309 Allergic rhinitis, unspecified: Secondary | ICD-10-CM

## 2019-05-25 DIAGNOSIS — D1039 Benign neoplasm of other parts of mouth: Secondary | ICD-10-CM | POA: Diagnosis not present

## 2019-05-25 DIAGNOSIS — J45909 Unspecified asthma, uncomplicated: Secondary | ICD-10-CM

## 2019-06-01 ENCOUNTER — Other Ambulatory Visit: Payer: Self-pay | Admitting: Cardiovascular Disease

## 2019-06-03 ENCOUNTER — Other Ambulatory Visit: Payer: Self-pay | Admitting: Family Medicine

## 2019-06-03 DIAGNOSIS — J45909 Unspecified asthma, uncomplicated: Secondary | ICD-10-CM

## 2019-06-03 NOTE — Telephone Encounter (Signed)
Requested Prescriptions  Pending Prescriptions Disp Refills  . albuterol (VENTOLIN HFA) 108 (90 Base) MCG/ACT inhaler [Pharmacy Med Name: ALBUTEROL HFA INH (200 PUFFS) 8.5GM] 8.5 g 0    Sig: INHALE 2 PUFFS INTO THE LUNGS EVERY 6 HOURS AS NEEDED FOR WHEEZING OR SHORTNESS OF BREATH     Pulmonology:  Beta Agonists Failed - 06/03/2019 10:45 AM      Failed - One inhaler should last at least one month. If the patient is requesting refills earlier, contact the patient to check for uncontrolled symptoms.      Passed - Valid encounter within last 12 months    Recent Outpatient Visits          1 month ago Hoarseness   [facility], Ranell Patrick, MD   2 months ago On pre-exposure prophylaxis for HIV   [facility], Ranell Patrick, MD   7 months ago On pre-exposure prophylaxis for HIV   [facility], Ranell Patrick, MD   1 year ago Routine screening for STI (sexually transmitted infection)   [facility], Ranell Patrick, MD   1 year ago Hypokalemia   [facility], Ranell Patrick, MD             Left voicemail asking patient to call back to schedule follow up on asthma as requested by Dr. Carlota Raspberry.  30 day courtesy refill sent.

## 2019-06-05 IMAGING — CT CT HEART MORP W/ CTA COR W/ SCORE W/ CA W/CM &/OR W/O CM
4 of 7 series · 8 of 20 positions shown, 9 images · IV contrast (APPLIED)
Comparison: 09/02/2012

CLINICAL DATA: 47-year-old male with h/o obesity, DM, strong family
h/o premature CAD now presenting with worsening BEZA. Prior coronary
CTA with non-obstructive CAD.

EXAM:
Cardiac/Coronary  CT
TECHNIQUE: The patient was scanned on a [employer] Force scanner.

[Series 6: best diast 75 % · axial · 0.39mm/px · z∈[+1183,+1228]mm · 2 of 336 slices shown, 3 images]
[im 112/336  vessel]
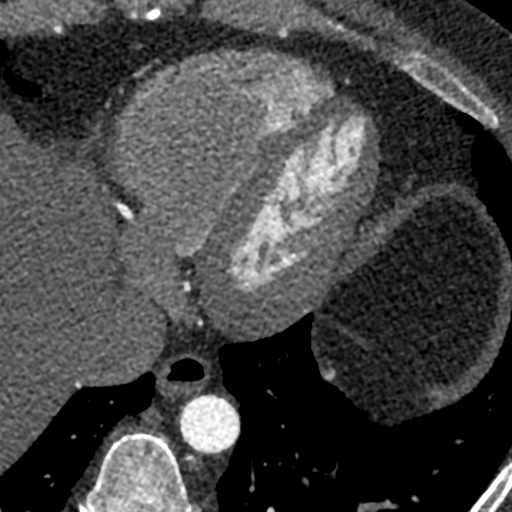
[im 112/336  lung]
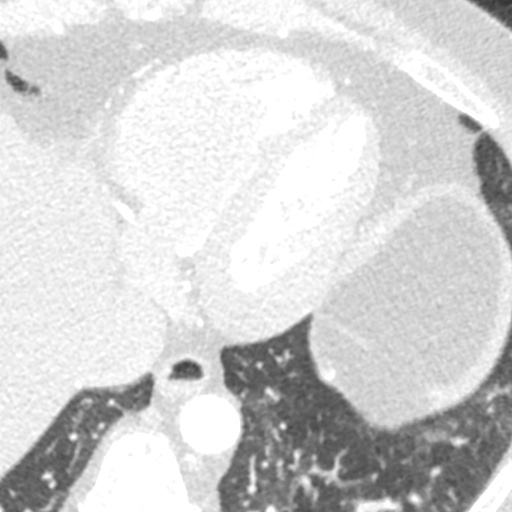
[im 224/336  vessel]
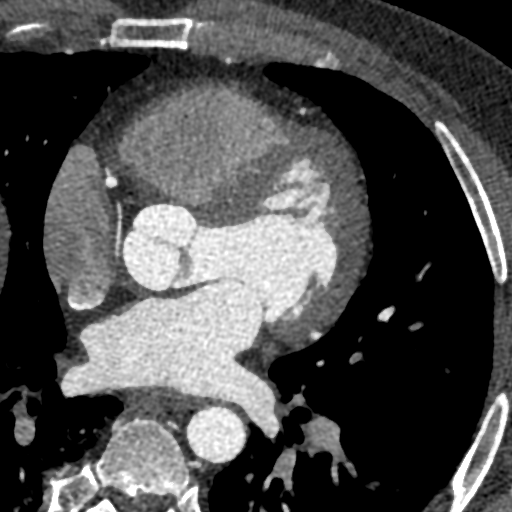

[Series 7: best syst 40 % · axial · 0.39mm/px · z∈[+1183,+1228]mm · 2 of 336 slices shown]
[im 112/336  vessel]
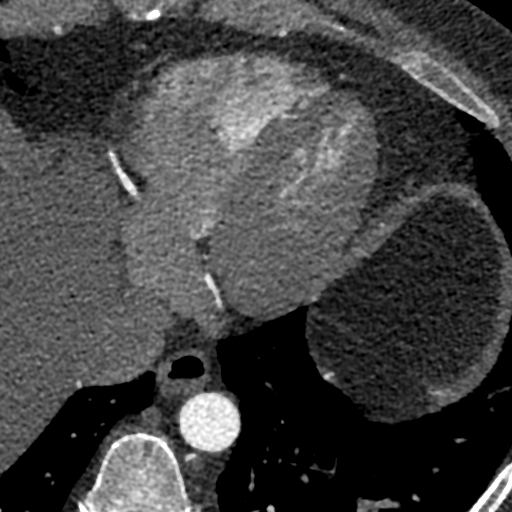
[im 224/336  vessel]
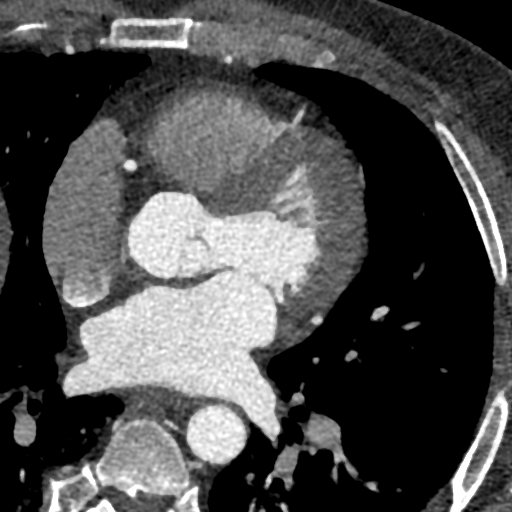

[Series 8: ts diast sharp 75 % · axial · 0.39mm/px · z∈[+1183,+1228]mm · 2 of 336 slices shown]
[im 112/336  lung]
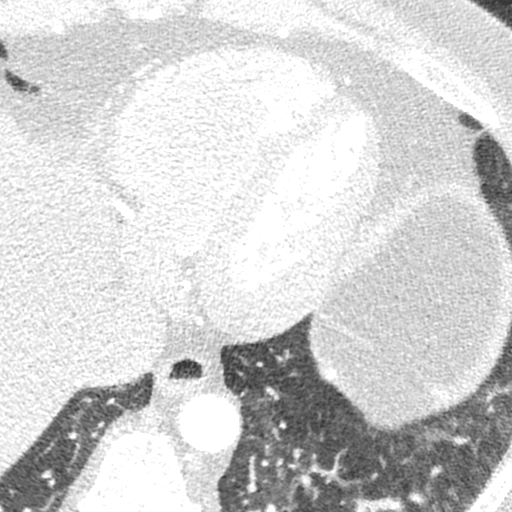
[im 224/336  lung]
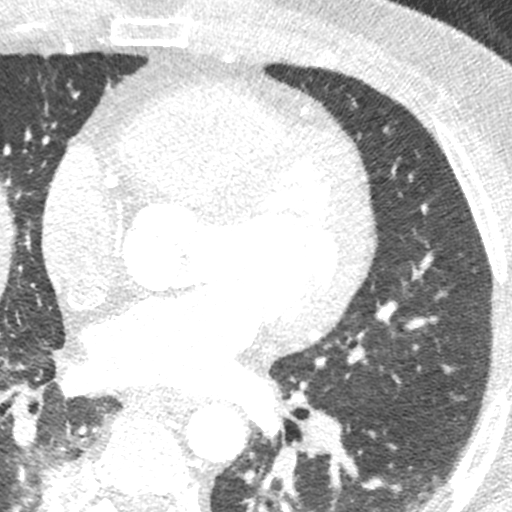

[Series 9: ts syst sharp 40 % · axial · 0.39mm/px · z∈[+1183,+1228]mm · 2 of 336 slices shown]
[im 112/336  lung]
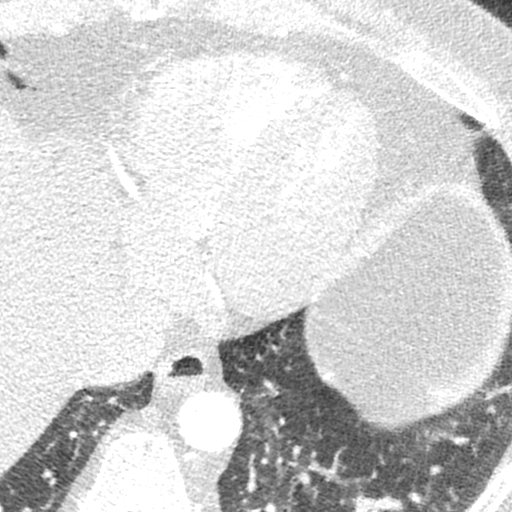
[im 224/336  lung]
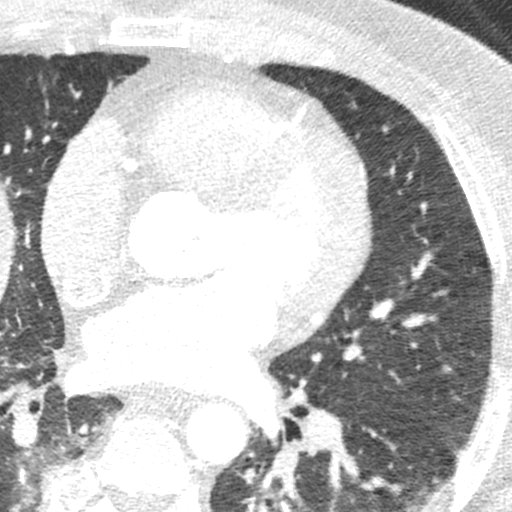

[8 of 20 positions shown; findings below may reference images not displayed]



Aorta:  Normal size.  No calcifications.  No dissection.

Aortic Valve:  Trileaflet.  No calcifications.

Coronary Arteries:  Normal coronary origin.  Right dominance.

RCA is a large dominant artery that gives rise to PDA and PLVB.
There is minimal non-calcified plaque.

Left main is a large artery that gives rise to LAD, a very small
ramus intermedius and LCX arteries. Left main has no plaque.

LAD is a medium caliber vessel that gives rise to one diagonal
artery. Proximal LAD has a mild mixed plaque wt the takeoff of the
first diagonal artery with associated stenosis 25-50%.

D1 has no significant plaque.

RI is a very small artery that has no plaque.

LCX is a non-dominant artery that gives rise to one large OM1
branch. There is no plaque.

Other findings:

Normal pulmonary vein drainage into the left atrium.

Normal let atrial appendage without a thrombus.

Normal size of the pulmonary artery.
IMPRESSION: 1. Coronary calcium score of 7. This was 69 percentile for age and
sex matched control.

2. Normal coronary origin with right dominance.

3. Mild non-obstructive CAD in the proximal LAD, otherwise normal
coronaries. Aggressive risk factor modification is recommended.

EXAM:
OVER-READ INTERPRETATION  CT CHEST

The following report is an over-read performed by radiologist Dr.
Mareyama Eissa [REDACTED] on 04/24/2018. This over-read
does not include interpretation of cardiac or coronary anatomy or
pathology. The coronary CTA interpretation by the cardiologist is
attached.
FINDINGS: Vascular: Heart is normal size.  Visualized aorta is normal caliber.

Mediastinum/Nodes: No adenopathy in the lower mediastinum or hila.

Lungs/Pleura: Dependent atelectasis in the lower lobes. No
effusions.

Upper Abdomen: 2.3 cm low-density area within the left hepatic lobe
is stable since prior study and likely reflects cyst.

Musculoskeletal: Chest wall soft tissues are unremarkable. Old left
posterolateral rib fracture with nonunion. No acute bony
abnormality.
IMPRESSION: No acute extra cardiac abnormality.

## 2019-06-15 ENCOUNTER — Other Ambulatory Visit: Payer: Self-pay | Admitting: Family Medicine

## 2019-06-15 DIAGNOSIS — K580 Irritable bowel syndrome with diarrhea: Secondary | ICD-10-CM

## 2019-07-10 ENCOUNTER — Other Ambulatory Visit: Payer: Self-pay | Admitting: Family Medicine

## 2019-07-10 NOTE — Telephone Encounter (Signed)
Requested medication (s) are due for refill today: yes  Requested medication (s) are on the active medication list: yes  Last refill:  05/29/2019  Future visit scheduled no Notes to clinic:  Review for refill   Requested Prescriptions  Pending Prescriptions Disp Refills   ADVAIR DISKUS 250-50 MCG/DOSE AEPB [Pharmacy Med Name: ADVAIR DISKUS 250/50MCG (YELLOW) 60] 60 each     Sig: INHALE 1 PUFF INTO THE LUNGS TWICE DAILY AS NEEDED FOR ALLERGIES OR WHEEZING OR SHORTNESS OF BREATH     Pulmonology:  Combination Products Passed - 07/10/2019  1:13 PM      Passed - Valid encounter within last 12 months    Recent Outpatient Visits          2 months ago Hoarseness   [facility], Ranell Patrick, MD   3 months ago Asthma, unspecified asthma severity, unspecified whether complicated, unspecified whether persistent   [facility], Ranell Patrick, MD   3 months ago On pre-exposure prophylaxis for HIV   [facility], Ranell Patrick, MD   3 months ago Coronary artery disease involving native heart without angina pectoris, unspecified vessel or lesion type   [facility], Ranell Patrick, MD   8 months ago On pre-exposure prophylaxis for HIV   [facility], Ranell Patrick, MD

## 2019-07-10 NOTE — Telephone Encounter (Signed)
Requested medication (s) are due for refill today: yes  Requested medication (s) are on the active medication list: yes  Last refill: 05/29/2019  Future visit scheduled: no  Notes to clinic:  Review for refill   Requested Prescriptions  Pending Prescriptions Disp Refills   ADVAIR DISKUS 250-50 MCG/DOSE AEPB [Pharmacy Med Name: ADVAIR DISKUS 250/50MCG (YELLOW) 60] 60 each     Sig: INHALE 1 PUFF INTO THE LUNGS TWICE DAILY AS NEEDED FOR ALLERGIES OR WHEEZING OR SHORTNESS OF BREATH     Pulmonology:  Combination Products Passed - 07/10/2019  1:13 PM      Passed - Valid encounter within last 12 months    Recent Outpatient Visits          2 months ago Hoarseness   [facility], Ranell Patrick, MD   3 months ago Asthma, unspecified asthma severity, unspecified whether complicated, unspecified whether persistent   [facility], Ranell Patrick, MD   3 months ago On pre-exposure prophylaxis for HIV   [facility], Ranell Patrick, MD   3 months ago Coronary artery disease involving native heart without angina pectoris, unspecified vessel or lesion type   [facility], Ranell Patrick, MD   8 months ago On pre-exposure prophylaxis for HIV   [facility], Ranell Patrick, MD

## 2019-07-13 ENCOUNTER — Encounter: Payer: Self-pay | Admitting: Family Medicine

## 2019-07-17 NOTE — Telephone Encounter (Signed)
Medication sent on 07/15/19

## 2019-07-27 ENCOUNTER — Other Ambulatory Visit: Payer: Self-pay

## 2019-07-27 ENCOUNTER — Telehealth: Payer: Self-pay | Admitting: Family Medicine

## 2019-07-27 DIAGNOSIS — J45909 Unspecified asthma, uncomplicated: Secondary | ICD-10-CM

## 2019-07-27 DIAGNOSIS — F411 Generalized anxiety disorder: Secondary | ICD-10-CM

## 2019-07-27 DIAGNOSIS — F5104 Psychophysiologic insomnia: Secondary | ICD-10-CM

## 2019-07-27 DIAGNOSIS — J309 Allergic rhinitis, unspecified: Secondary | ICD-10-CM

## 2019-07-27 MED ORDER — FLUTICASONE PROPIONATE 50 MCG/ACT NA SUSP: NASAL | 6 refills | Status: DC

## 2019-07-27 MED ORDER — ALBUTEROL SULFATE HFA 108 (90 BASE) MCG/ACT IN AERS: INHALATION_SPRAY | RESPIRATORY_TRACT | 0 refills | Status: DC

## 2019-07-27 NOTE — Telephone Encounter (Signed)
Asthma medications refilled as requested- controlled mediactions sent for review.

## 2019-07-27 NOTE — Telephone Encounter (Signed)
Requested medication (s) are due for refill today _yes  Requested medication (s) are on the active medication list -yes  Future visit scheduled -no  Last refill: Ambien-04/14/19                 Xanax-05/12/19  Notes to clinic: Patient is requesting refill on non delegated Rx- sent for provider review.  Requested Prescriptions  Pending Prescriptions Disp Refills   ALPRAZolam (XANAX) 1 MG tablet 60 tablet 1    Sig: Take 1 tablet (1 mg total) by mouth 2 (two) times daily as needed.     Not Delegated - Psychiatry:  Anxiolytics/Hypnotics Failed - 07/27/2019  9:35 AM      Failed - This refill cannot be delegated      Failed - Urine Drug Screen completed in last 360 days.      Passed - Valid encounter within last 6 months    Recent Outpatient Visits          3 months ago Hoarseness   [facility], Ranell Patrick, MD   4 months ago Asthma, unspecified asthma severity, unspecified whether complicated, unspecified whether persistent   [facility], Ranell Patrick, MD   4 months ago On pre-exposure prophylaxis for HIV   [facility], Ranell Patrick, MD   4 months ago Coronary artery disease involving native heart without angina pectoris, unspecified vessel or lesion type   [facility], Ranell Patrick, MD   9 months ago On pre-exposure prophylaxis for HIV   [facility], Ranell Patrick, MD              zolpidem (AMBIEN) 10 MG tablet 90 tablet 0    Sig: Take 1 tablet (10 mg total) by mouth at bedtime.     Not Delegated - Psychiatry:  Anxiolytics/Hypnotics Failed - 07/27/2019  9:35 AM      Failed - This refill cannot be delegated      Failed - Urine Drug Screen completed in last 360 days.      Passed - Valid encounter within last 6 months    Recent Outpatient Visits          3 months ago Hoarseness   [facility], Ranell Patrick, MD   4 months ago Asthma, unspecified asthma severity, unspecified whether  complicated, unspecified whether persistent   [facility], Ranell Patrick, MD   4 months ago On pre-exposure prophylaxis for HIV   [facility], Ranell Patrick, MD   4 months ago Coronary artery disease involving native heart without angina pectoris, unspecified vessel or lesion type   [facility], Ranell Patrick, MD   9 months ago On pre-exposure prophylaxis for HIV   [facility], Ranell Patrick, MD             Signed Prescriptions Disp Refills   albuterol (VENTOLIN HFA) 108 (90 Base) MCG/ACT inhaler 8.5 g 0    Sig: INHALE 2 PUFFS INTO THE LUNGS EVERY 6 HOURS AS NEEDED FOR WHEEZING OR SHORTNESS OF BREATH     Pulmonology:  Beta Agonists Failed - 07/27/2019  9:35 AM      Failed - One inhaler should last at least one month. If the patient is requesting refills earlier, contact the patient to check for uncontrolled symptoms.      Passed - Valid encounter within last 12 months  Recent Outpatient Visits          3 months ago Hoarseness   [facility], Ranell Patrick, MD   4 months ago Asthma, unspecified asthma severity, unspecified whether complicated, unspecified whether persistent   [facility], Ranell Patrick, MD   4 months ago On pre-exposure prophylaxis for HIV   [facility], Ranell Patrick, MD   4 months ago Coronary artery disease involving native heart without angina pectoris, unspecified vessel or lesion type   [facility], Ranell Patrick, MD   9 months ago On pre-exposure prophylaxis for HIV   [facility], Ranell Patrick, MD              fluticasone (FLONASE) 50 MCG/ACT nasal spray 16 g 6    Sig: INSTILL 2 SPRAYS IN EACH NOSTRIL DAILY     Ear, Nose, and Throat: Nasal Preparations - Corticosteroids Passed - 07/27/2019  9:35 AM      Passed - Valid encounter within last 12 months    Recent Outpatient Visits          3 months ago Hoarseness   [facility], Ranell Patrick, MD   4 months ago Asthma, unspecified asthma severity, unspecified whether complicated, unspecified whether persistent   [facility], Ranell Patrick, MD   4 months ago On pre-exposure prophylaxis for HIV   [facility], Ranell Patrick, MD   4 months ago Coronary artery disease involving native heart without angina pectoris, unspecified vessel or lesion type   [facility], Ranell Patrick, MD   9 months ago On pre-exposure prophylaxis for HIV   [facility], Ranell Patrick, MD                Requested Prescriptions  Pending Prescriptions Disp Refills   ALPRAZolam (XANAX) 1 MG tablet 60 tablet 1    Sig: Take 1 tablet (1 mg total) by mouth 2 (two) times daily as needed.     Not Delegated - Psychiatry:  Anxiolytics/Hypnotics Failed - 07/27/2019  9:35 AM      Failed - This refill cannot be delegated      Failed - Urine Drug Screen completed in last 360 days.      Passed - Valid encounter within last 6 months    Recent Outpatient Visits          3 months ago Hoarseness   [facility], Ranell Patrick, MD   4 months ago Asthma, unspecified asthma severity, unspecified whether complicated, unspecified whether persistent   [facility], Ranell Patrick, MD   4 months ago On pre-exposure prophylaxis for HIV   [facility], Ranell Patrick, MD   4 months ago Coronary artery disease involving native heart without angina pectoris, unspecified vessel or lesion type   [facility], Ranell Patrick, MD   9 months ago On pre-exposure prophylaxis for HIV   [facility], Ranell Patrick, MD              zolpidem (AMBIEN) 10 MG tablet 90 tablet 0    Sig: Take 1 tablet (10 mg total) by mouth at bedtime.     Not Delegated - Psychiatry:  Anxiolytics/Hypnotics Failed - 07/27/2019  9:35 AM      Failed - This refill cannot be delegated  Failed -  Urine Drug Screen completed in last 360 days.      Passed - Valid encounter within last 6 months    Recent Outpatient Visits          3 months ago Hoarseness   [facility], Ranell Patrick, MD   4 months ago Asthma, unspecified asthma severity, unspecified whether complicated, unspecified whether persistent   [facility], Ranell Patrick, MD   4 months ago On pre-exposure prophylaxis for HIV   [facility], Ranell Patrick, MD   4 months ago Coronary artery disease involving native heart without angina pectoris, unspecified vessel or lesion type   [facility], Ranell Patrick, MD   9 months ago On pre-exposure prophylaxis for HIV   [facility], Ranell Patrick, MD             Signed Prescriptions Disp Refills   albuterol (VENTOLIN HFA) 108 (90 Base) MCG/ACT inhaler 8.5 g 0    Sig: INHALE 2 PUFFS INTO THE LUNGS EVERY 6 HOURS AS NEEDED FOR WHEEZING OR SHORTNESS OF BREATH     Pulmonology:  Beta Agonists Failed - 07/27/2019  9:35 AM      Failed - One inhaler should last at least one month. If the patient is requesting refills earlier, contact the patient to check for uncontrolled symptoms.      Passed - Valid encounter within last 12 months    Recent Outpatient Visits          3 months ago Hoarseness   [facility], Ranell Patrick, MD   4 months ago Asthma, unspecified asthma severity, unspecified whether complicated, unspecified whether persistent   [facility], Ranell Patrick, MD   4 months ago On pre-exposure prophylaxis for HIV   [facility], Ranell Patrick, MD   4 months ago Coronary artery disease involving native heart without angina pectoris, unspecified vessel or lesion type   [facility], Ranell Patrick, MD   9 months ago On pre-exposure prophylaxis for HIV   [facility], Ranell Patrick, MD              fluticasone (FLONASE) 50 MCG/ACT nasal  spray 16 g 6    Sig: INSTILL 2 SPRAYS IN EACH NOSTRIL DAILY     Ear, Nose, and Throat: Nasal Preparations - Corticosteroids Passed - 07/27/2019  9:35 AM      Passed - Valid encounter within last 12 months    Recent Outpatient Visits          3 months ago Hoarseness   [facility], Ranell Patrick, MD   4 months ago Asthma, unspecified asthma severity, unspecified whether complicated, unspecified whether persistent   [facility], Ranell Patrick, MD   4 months ago On pre-exposure prophylaxis for HIV   [facility], Ranell Patrick, MD   4 months ago Coronary artery disease involving native heart without angina pectoris, unspecified vessel or lesion type   [facility], Ranell Patrick, MD   9 months ago On pre-exposure prophylaxis for HIV   [facility], Ranell Patrick, MD

## 2019-07-27 NOTE — Telephone Encounter (Signed)
Medication Refill - Medication:  zolpidem (AMBIEN) 10 MG tablet /ALPRAZolam (XANAX) 1 MG tablet/fluticasone (FLONASE) 50 MCG/ACT nasal spray/albuterol (VENTOLIN HFA) 108 (90 Base) MCG/ACT inhaler/Pt stated he requested refill through pharmacy at the beginning of last week with no result. Also stated Dr. Carlota Raspberry can send in alternative to ventolin.   Has the patient contacted their pharmacy? Yes.   (Agent: If no, request that the patient contact the pharmacy for the refill.) (Agent: If yes, when and what did the pharmacy advise?)  Preferred Pharmacy (with phone number or street name): [facility], [place] [phone] (Phone) [phone] (Fax)     Agent: Please be advised that RX refills may take up to 3 business days. We ask that you follow-up with your pharmacy.

## 2019-07-28 MED ORDER — ALPRAZOLAM 1 MG PO TABS: 1.0000 mg | ORAL_TABLET | Freq: Two times a day (BID) | ORAL | 1 refills | Status: DC | PRN

## 2019-07-28 MED ORDER — ZOLPIDEM TARTRATE 10 MG PO TABS: 10.0000 mg | ORAL_TABLET | Freq: Every day | ORAL | 0 refills | Status: DC

## 2019-07-28 NOTE — Telephone Encounter (Signed)
Not needed. I sent the refills of ambien and xanax today, and I think the otehrs were sent  Ok to refill others if needed. Thanks.   -JG

## 2019-07-28 NOTE — Telephone Encounter (Signed)
Discussed at visit May 8. Alprazolam filled August 10 for #60, Ambien No. 90 on June 10.  Refills ordered.

## 2019-07-28 NOTE — Telephone Encounter (Signed)
Hi, would you like for me to set up a Tele-med for this pt for medication refills, please advise.

## 2019-07-30 ENCOUNTER — Other Ambulatory Visit: Payer: Self-pay | Admitting: Family Medicine

## 2019-07-30 DIAGNOSIS — Z113 Encounter for screening for infections with a predominantly sexual mode of transmission: Secondary | ICD-10-CM

## 2019-07-30 MED ORDER — TRUVADA 200-300 MG PO TABS: 1.0000 | ORAL_TABLET | Freq: Every day | ORAL | 1 refills | Status: DC

## 2019-07-30 NOTE — Telephone Encounter (Signed)
emtricitabine-tenofovir (TRUVADA) 200-300 MG tablet  [facility], [place] - [address] AT [place] (Phone) [phone] (Fax)   States 2nd request

## 2019-07-30 NOTE — Telephone Encounter (Signed)
Requested medication (s) are due for refill today: yes  Requested medication (s) are on the active medication list: yes  Last refill:  11/04/2018  Future visit scheduled: no  Notes to clinic:  Review for refill   Requested Prescriptions  Pending Prescriptions Disp Refills   emtricitabine-tenofovir (TRUVADA) 200-300 MG tablet 90 tablet 2    Sig: Take 1 tablet by mouth daily.     Off-Protocol Failed - 07/30/2019  9:37 AM      Failed - Medication not assigned to a protocol, review manually.      Passed - Valid encounter within last 12 months    Recent Outpatient Visits          3 months ago Hoarseness   [facility], Ranell Patrick, MD   4 months ago Asthma, unspecified asthma severity, unspecified whether complicated, unspecified whether persistent   [facility], Ranell Patrick, MD   4 months ago On pre-exposure prophylaxis for HIV   [facility], Ranell Patrick, MD   4 months ago Coronary artery disease involving native heart without angina pectoris, unspecified vessel or lesion type   [facility], Ranell Patrick, MD   9 months ago On pre-exposure prophylaxis for HIV   [facility], Ranell Patrick, MD

## 2019-09-15 DIAGNOSIS — Z1159 Encounter for screening for other viral diseases: Secondary | ICD-10-CM | POA: Diagnosis not present

## 2019-09-16 ENCOUNTER — Other Ambulatory Visit: Payer: Self-pay | Admitting: Family Medicine

## 2019-09-16 DIAGNOSIS — G43109 Migraine with aura, not intractable, without status migrainosus: Secondary | ICD-10-CM

## 2019-09-16 DIAGNOSIS — J309 Allergic rhinitis, unspecified: Secondary | ICD-10-CM

## 2019-09-16 DIAGNOSIS — J45909 Unspecified asthma, uncomplicated: Secondary | ICD-10-CM

## 2019-10-22 ENCOUNTER — Other Ambulatory Visit: Payer: Self-pay | Admitting: Family Medicine

## 2019-10-22 DIAGNOSIS — R519 Headache, unspecified: Secondary | ICD-10-CM | POA: Diagnosis not present

## 2019-10-22 DIAGNOSIS — R05 Cough: Secondary | ICD-10-CM | POA: Diagnosis not present

## 2019-10-22 DIAGNOSIS — Z03818 Encounter for observation for suspected exposure to other biological agents ruled out: Secondary | ICD-10-CM | POA: Diagnosis not present

## 2019-10-22 DIAGNOSIS — R5383 Other fatigue: Secondary | ICD-10-CM | POA: Diagnosis not present

## 2019-10-22 DIAGNOSIS — F5104 Psychophysiologic insomnia: Secondary | ICD-10-CM

## 2019-10-22 NOTE — Telephone Encounter (Signed)
Medication Refill - Medication: zolpidem (AMBIEN) 10 MG tablet    Has the patient contacted their pharmacy? Yes.   (Agent: If no, request that the patient contact the pharmacy for the refill.) (Agent: If yes, when and what did the pharmacy advise?) [facility], [address] AT [address] Preferred Pharmacy (with phone number or street name):   Agent: Please be advised that RX refills may take up to 3 business days. We ask that you follow-up with your pharmacy. Pt says he has called pharm but they have not heard back   He is not sure if he is due for labs and appt at this time

## 2019-10-22 NOTE — Telephone Encounter (Signed)
Patient is requesting a refill of the following medications: Requested Prescriptions   Pending Prescriptions Disp Refills   zolpidem (AMBIEN) 10 MG tablet 90 tablet 0    Sig: Take 1 tablet (10 mg total) by mouth at bedtime.    Date of patient request: 10/22/19 Last office visit: 07/28/19 Date of last refill: 07/28/19 Last refill amount: 90 Follow up time period per chart: 10/26/19

## 2019-10-24 MED ORDER — ZOLPIDEM TARTRATE 10 MG PO TABS: 10.0000 mg | ORAL_TABLET | Freq: Every day | ORAL | 0 refills | Status: DC

## 2019-10-24 NOTE — Telephone Encounter (Signed)
Controlled substance database (PDMP) reviewed.  Last filled in September.  Has an appointment in 2 days and can discuss further.  Refill ordered.

## 2019-10-26 ENCOUNTER — Other Ambulatory Visit: Payer: Self-pay

## 2019-10-26 ENCOUNTER — Other Ambulatory Visit (HOSPITAL_COMMUNITY)
Admission: RE | Admit: 2019-10-26 | Discharge: 2019-10-26 | Disposition: A | Discharge status: Home / Self Care | Payer: BC Managed Care – PPO | Source: Ambulatory Visit | Attending: Family Medicine | Admitting: Family Medicine

## 2019-10-26 ENCOUNTER — Ambulatory Visit: Payer: BC Managed Care – PPO | Admitting: Family Medicine

## 2019-10-26 VITALS — BP 110/60 | HR 78 | Temp 98.2°F | Ht 71.0 in | Wt 284.6 lb

## 2019-10-26 DIAGNOSIS — Z113 Encounter for screening for infections with a predominantly sexual mode of transmission: Secondary | ICD-10-CM | POA: Insufficient documentation

## 2019-10-26 DIAGNOSIS — Z131 Encounter for screening for diabetes mellitus: Secondary | ICD-10-CM

## 2019-10-26 DIAGNOSIS — Z5181 Encounter for therapeutic drug level monitoring: Secondary | ICD-10-CM | POA: Diagnosis not present

## 2019-10-26 DIAGNOSIS — Z79899 Other long term (current) drug therapy: Secondary | ICD-10-CM

## 2019-10-26 DIAGNOSIS — J309 Allergic rhinitis, unspecified: Secondary | ICD-10-CM

## 2019-10-26 DIAGNOSIS — J45909 Unspecified asthma, uncomplicated: Secondary | ICD-10-CM | POA: Diagnosis not present

## 2019-10-26 DIAGNOSIS — F411 Generalized anxiety disorder: Secondary | ICD-10-CM | POA: Diagnosis not present

## 2019-10-26 DIAGNOSIS — F5104 Psychophysiologic insomnia: Secondary | ICD-10-CM

## 2019-10-26 DIAGNOSIS — G43909 Migraine, unspecified, not intractable, without status migrainosus: Secondary | ICD-10-CM

## 2019-10-26 NOTE — Patient Instructions (Addendum)
  Stay on advair twice pe day for now and recheck in 1 month to discuss asthma.  Call neurology - headache specialist for appointment. I refilled maxalt for now.  Plan to follow up for anxiety med discussion in 6 months, but no changes for now.  Let me know if any meds are running out before next visit and if there are questions. Take care and stay safe.   If you have lab work done today you will be contacted with your lab results within the next 2 weeks.  If you have not heard from [place] then please contact us. The fastest way to get your results is to register for My Chart.   IF you received an x-ray today, you will receive an invoice from [facility]. Please contact [facility] at [phone] with questions or concerns regarding your invoice.   IF you received labwork today, you will receive an invoice from [facility]. Please contact [facility] at [phone] with questions or concerns regarding your invoice.   Our billing staff will not be able to assist you with questions regarding bills from these companies.  You will be contacted with the lab results as soon as they are available. The fastest way to get your results is to activate your My Chart account. Instructions are located on the last page of this paperwork. If you have not heard from [place] regarding the results in 2 weeks, please contact this office.

## 2019-10-26 NOTE — Progress Notes (Signed)
Subjective:  Patient ID: Randall Apple., male    DOB: 1971-01-17  Age: 48 y.o. MRN: [id number]  CC:  Chief Complaint  Patient presents with  . Follow-up    medications    HPI Randall Kazlauskas. presents for   Medication review.   Asthma Continued same regimen in June.  His use Atrovent instead of albuterol at times.  Advair prescribed twice daily, has used intermittently.  Various regimens have been discussed previously but has been stable with as needed use of Advair.  Had prednisone last week form [facility] in [place] for some flair in respiratory issues. Had neg covid test.  Using Advair BID now - but once per day when well. Prior flare 3 months prior.   On preexposure prophylaxis for HIV Takes Truvada. One unprotected intercourse since last sti testing. No symptoms. No new side effects of meds.  Lab Results  Component Value Date   CREATININE 1.19 10/26/2019   Hypertension, CAD Followed by cardiology, [name] for nonobstructive CAD by coronary CTA.  Continued on antihypertensives.  Appointment with cardiology in May with [name].   BP Readings from Last 3 Encounters:  10/26/19 110/60  04/17/19 108/69  03/27/19 114/85   Allergic rhinitis Flonase nasal spray, Atrovent nasal spray, Singulair, Allegra daily. Doing ok. occasional Sudafed at times if more congestion for 2-3 days.   Migraine headaches Maxalt as needed previously, headache specialist in [place] discussed.  Referred to Dr. Rowe Pavy in June. Has not seen yet. Plans to schedule. maxalt 2-3 per month.   Generalized anxiety disorder with insomnia See previous visits.  We have tried multiple SSRI previously that were not tolerated.  Overall plan to decrease use of benzodiazepine as tolerated.  Has been continued on Xanax with anywhere from 1-3 tabs per day previously.  Continued on Ambien for sleep without parasomnias (not taking Xanax at night) still on ambien nightly.   Averaging 2 xanax per day.  At highest 3. None yesterday as not working. More when working - 2-3 per day. Average of 2 per day has been doing   GAD 7 : Generalized Anxiety Score 04/04/2018  Nervous, Anxious, on Edge 1  Control/stop worrying 0  Worry too much - different things 1  Trouble relaxing 0  Restless 0  Easily annoyed or irritable 1  Afraid - awful might happen 0  Total GAD 7 Score 3  Anxiety Difficulty Somewhat difficult   Depression screen [facility] 2/9 10/26/2019 04/17/2019 03/27/2019 03/13/2019 10/17/2018  Decreased Interest 0 0 0 0 0  Down, Depressed, Hopeless 0 0 0 0 0  PHQ - 2 Score 0 0 0 0 0  Altered sleeping - - 0 - -  Tired, decreased energy - - 1 - -  Change in appetite - - 0 - -  Feeling bad or failure about yourself  - - 0 - -  Trouble concentrating - - 0 - -  Moving slowly or fidgety/restless - - 0 - -  Suicidal thoughts - - 0 - -  PHQ-9 Score - - 1 - -  Difficult doing work/chores - - Not difficult at all - -  Some recent data might be hidden   Endocrine: Followed by Dr.Kerr for hypogonadism, on testosterone. appt next week.  Previously had treated with metformin for elevated glucose/prediabetes but could not tolerate with IBS history.  Normal A1c in May.   Lab Results  Component Value Date   HGBA1C 5.5 10/26/2019   IBS, GERD [facility], bentyl and nexium BID.  [name]. Plans on scheduling follow up.  Stable with occasional flair with stress and food. History of diverticulitis - watching foods.   History Patient Active Problem List   Diagnosis Date Noted  . Coronary artery disease involving native coronary artery of native heart without angina pectoris 03/05/2019  . Irritable bowel syndrome 03/14/2016  . Asthma 11/19/2015  . 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 rotator cuff tear 10/17/2013  . Hypogonadism male 09/04/2013  . ED (erectile dysfunction) 07/30/2013  . Palpitations 02/21/2012  . BMI 33.0-33.9,adult 02/04/2012  . Allergic rhinitis  02/04/2012  . GERD (gastroesophageal reflux disease) 02/04/2012  . Migraine 02/04/2012  . GAD (generalized anxiety disorder) 02/04/2012  . Insomnia 02/04/2012  . Essential hypertension 04/13/2009  . CHEST PAIN-UNSPECIFIED 04/13/2009   Past Medical History:  Diagnosis Date  . Anxiety   . Arthritis   . Asthma   . Depression   . GERD (gastroesophageal reflux disease)   . Seasonal allergic reaction    Past Surgical History:  Procedure Laterality Date  . LIPOMA EXCISION N/A 10/30/2018   Procedure: EXCISION OF MULTIPLE SUBCUTANEOUS LIPOMAS ON TORSO;  Surgeon: Donnie Mesa, MD;  Location: [place];  Service: General;  Laterality: N/A;  . none    . none     Allergies  Allergen Reactions  . Desloratadine Other (See Comments)    CLARINEX-"severe headache"  . Loratadine Other (See Comments)    "severe headache"  . Levbid [Hyoscyamine Sulfate] Rash  . Telithromycin Rash   Prior to Admission medications   Medication Sig Start Date End Date Taking? Authorizing Provider  acetic acid-hydrocortisone (VOSOL-HC) otic solution Place 3 drops into both ears 3 (three) times daily. Use as needed to prevent swimmer's ear 01/10/15  Yes Leandrew Koyanagi, MD  ADVAIR DISKUS 250-50 MCG/DOSE AEPB INHALE 1 PUFF INTO THE LUNGS TWICE DAILY AS NEEDED FOR ALLERGIES OR WHEEZING OR SHORTNESS OF BREATH 07/15/19  Yes [name], MD  albuterol (VENTOLIN HFA) 108 (90 Base) MCG/ACT inhaler INHALE 2 PUFFS INTO THE LUNGS EVERY 6 HOURS AS NEEDED FOR WHEEZING OR SHORTNESS OF BREATH 07/27/19  Yes [name], MD  ALPRAZolam [name]) 1 MG tablet Take 1 tablet (1 mg total) by mouth 2 (two) times daily as needed. 07/28/19  Yes [name], MD  amLODipine (NORVASC) 5 MG tablet Take 1 tablet (5 mg total) by mouth daily. 04/15/19 04/09/20 Yes Weaver, Scott T, PA-C  aspirin EC 81 MG tablet Take 1 tablet (81 mg total) by mouth daily. 03/07/15  Yes Sherren Mocha, MD  benzonatate (TESSALON) 100 MG  capsule TAKE 1 CAPSULE(100 MG) BY MOUTH THREE TIMES DAILY AS NEEDED FOR COUGH 04/14/19  Yes [name], MD  chlorthalidone (HYGROTON) 25 MG tablet TAKE 1 TABLET(25 MG) BY MOUTH DAILY 04/15/19  Yes Weaver, Scott T, PA-C  dicyclomine (BENTYL) 20 MG tablet Take 20 mg by mouth 2 (two) times a day.   Yes [provider]  emtricitabine-tenofovir (TRUVADA) 200-300 MG tablet Take 1 tablet by mouth daily. 07/30/19  Yes [name], MD  esomeprazole (NEXIUM) 40 MG capsule TAKE ONE CAPSULE BY MOUTH TWICE DAILY BEFORE A MEAL 06/15/19  Yes [name], MD  Fexofenadine HCl (ALLEGRA PO) Take 1 tablet by mouth daily.    Yes [provider]  fluticasone (FLONASE) 50 MCG/ACT nasal spray INSTILL 2 SPRAYS IN EACH NOSTRIL DAILY 07/27/19  Yes [name], MD  ipratropium (ATROVENT) 0.06 %  nasal spray Place 2 sprays into the nose 2 (two) times daily. 10/17/18  Yes [name], MD  losartan (COZAAR) 100 MG tablet TAKE 1 TABLET(100 MG) BY MOUTH DAILY 04/15/19  Yes [name] T, PA-C  metoprolol succinate (TOPROL-XL) 25 MG 24 hr tablet TAKE 1 TABLET(25 MG) BY MOUTH AT BEDTIME 06/01/19  Yes Weaver, Scott T, PA-C  montelukast (SINGULAIR) 10 MG tablet TAKE 1 TABLET(10 MG) BY MOUTH AT BEDTIME 09/16/19  Yes [name], MD  ondansetron (ZOFRAN-ODT) 4 MG disintegrating tablet Take 4 mg by mouth every 8 (eight) hours as needed for nausea or vomiting.   Yes [provider]  potassium chloride SA (KLOR-CON M20) 20 MEQ tablet Take 1 tablet (20 mEq total) by mouth 2 (two) times daily. 04/20/19  Yes Weaver, Scott T, PA-C  PROCTOZONE-HC 2.5 % rectal cream USE RECTALLY TWICE DAILY 02/06/17  Yes [name], MD  rizatriptan (MAXALT-MLT) 10 MG disintegrating tablet DISSOLVE 1 TABLET BY MOUTH AS DIRECTED, REPEAT IN 2 HOURS AS NEEDED 09/16/19  Yes [name], MD  tadalafil (CIALIS) 20 MG tablet Take 1 tablet (20 mg total) by mouth daily as needed for erectile dysfunction.  05/18/19  Yes Weaver, Scott T, PA-C  testosterone cypionate (DEPOTESTOTERONE CYPIONATE) 100 MG/ML injection Inject 0.75 mg into the muscle every 7 (seven) days. For IM use only    Yes [provider]  Vitamin D, Ergocalciferol, (DRISDOL) 50000 units CAPS capsule Take 50,000 Units by mouth every 7 (seven) days.   Yes [provider]  zolpidem (AMBIEN) 10 MG tablet Take 1 tablet (10 mg total) by mouth at bedtime. 10/24/19  Yes [name], MD   Social History   Socioeconomic History  . Marital status: Single    Spouse name: Not on file  . Number of children: 0  . Years of education: Not on file  . Highest education level: Not on file  Occupational History  . 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 Smoking status: Never Smoker  . Smokeless tobacco: Never Used  Substance and Sexual Activity  . Alcohol use: Yes    Alcohol/week: 3.0 standard drinks    Types: 3 Standard drinks or equivalent per week  . Drug use: No  . Sexual activity: Yes  Other Topics Concern  . Not on file  Social History Narrative  . Not on file   Social Determinants of Health   Financial Resource Strain:   . Difficulty of Paying Living Expenses: Not on file  Food Insecurity:   . Worried About Charity fundraiser in the Last Year: Not on file  . Ran Out of Food in the Last Year: Not on file  Transportation Needs:   . Lack of Transportation (Medical): Not on file  . Lack of Transportation (Non-Medical): Not on file  Physical Activity:   . Days of Exercise per Week: Not on file  . Minutes of Exercise per Session: Not on file  Stress:   . Feeling of Stress : Not on file  Social Connections:   . Frequency of Communication with Friends and Family: Not on file  . Frequency of Social Gatherings with Friends and Family: Not on file  . Attends Religious Services: Not on file  . Active Member of Clubs or Organizations: Not on file  . Attends Theatre manager Meetings: Not on file  . Marital Status: Not on file  Intimate Partner Violence:   . Fear of  Current or Ex-Partner: Not on file  . Emotionally Abused: Not on file  . Physically Abused: Not on file  . Sexually Abused: Not on file    Review of Systems   Objective:   Vitals:   10/26/19 0832  BP: 110/60  Pulse: 78  Temp: 98.2 F (36.8 C)  TempSrc: Temporal  SpO2: 100%  Weight: 284 lb 9.6 oz (129.1 kg)  Height: 5\' 11"  (1.803 m)     Physical Exam Vitals reviewed.  Constitutional:      Appearance: He is well-developed.  HENT:     Head: Normocephalic and atraumatic.     Mouth/Throat:     Pharynx: No oropharyngeal exudate or posterior oropharyngeal erythema.  Eyes:     Conjunctiva/sclera: Conjunctivae normal.     Pupils: Pupils are equal, round, and reactive to light.  Cardiovascular:     Rate and Rhythm: Normal rate and regular rhythm.     Heart sounds: Normal heart sounds. No murmur.  Pulmonary:     Effort: Pulmonary effort is normal.     Breath sounds: Normal breath sounds. No wheezing, rhonchi or rales.  Abdominal:     Palpations: Abdomen is soft.     Tenderness: There is no abdominal tenderness.  Musculoskeletal:     Cervical back: Neck supple.  Lymphadenopathy:     Cervical: No cervical adenopathy.  Skin:    General: Skin is warm and dry.     Findings: No rash.  Neurological:     Mental Status: He is alert and oriented to person, place, and time.  Psychiatric:        Behavior: Behavior normal.        Assessment & Plan:  Randall Hoene. is a 48 y.o. male . GAD (generalized anxiety disorder) Psychophysiological insomnia  -Stable with attempts not wean Xanax, currently stable on current regimen.  Has not combined Xanax and Ambien at night no new parasomnias.  Appropriate fill pattern.  Continue same regimen for now  Uncomplicated asthma, unspecified asthma severity, unspecified whether persistent  -Recent flare, now improving.   Continue Advair with recheck in 1 month control and potential changes in regimen  Allergic rhinitis, unspecified seasonality, unspecified trigger  -Overall stable, no new changes.  Migraine without status migrainosus, not intractable, unspecified migraine type  -Importance of follow-up with headache specialist discussed.  Continue Maxalt as needed for now  On pre-exposure prophylaxis for HIV - Plan: Basic metabolic panel Routine screening for STI (sexually transmitted infection) - Plan: HIV antibody, RPR, Urine cytology ancillary only Medication monitoring encounter - Plan: Basic metabolic panel  -Tolerating Truvada, continue same.  Previous creatinine normal, repeat labs  Screening for diabetes mellitus - Plan: Hemoglobin A1c  -Continue to follow-up with endocrinology.  No orders of the defined types were placed in this encounter.  Patient Instructions    Stay on advair twice pe day for now and recheck in 1 month to discuss asthma.  Call neurology - headache specialist for appointment. I refilled maxalt for now.  Plan to follow up for anxiety med discussion in 6 months, but no changes for now.  Let me know if any meds are running out before next visit and if there are questions. Take care and stay safe.   If you have lab work done today you will be contacted with your lab results within the next 2 weeks.  If you have not heard from [place] then please contact us. The fastest way to get your results is to  register for My Chart.   IF you received an x-ray today, you will receive an invoice from [facility]. Please contact [facility] at [phone] with questions or concerns regarding your invoice.   IF you received labwork today, you will receive an invoice from [place]. Please contact [facility] at [phone] with questions or concerns regarding your invoice.   Our billing staff will not be able to assist you with questions regarding bills from these companies.  You  will be contacted with the lab results as soon as they are available. The fastest way to get your results is to activate your My Chart account. Instructions are located on the last page of this paperwork. If you have not heard from [place] regarding the results in 2 weeks, please contact this office.         Signed, [name], MD [facility]

## 2019-10-27 LAB — BASIC METABOLIC PANEL
BUN/Creatinine Ratio: 12 (ref 9–20)
BUN: 14 mg/dL (ref 6–24)
CO2: 22 mmol/L (ref 20–29)
Calcium: 9.5 mg/dL (ref 8.7–10.2)
Chloride: 98 mmol/L (ref 96–106)
Creatinine, Ser: 1.19 mg/dL (ref 0.76–1.27)
GFR calc Af Amer: 82 mL/min/{1.73_m2} (ref >59)
GFR calc non Af Amer: 71 mL/min/{1.73_m2} (ref >59)
Glucose: 117 mg/dL — ABNORMAL HIGH (ref 65–99)
Potassium: 3.6 mmol/L (ref 3.5–5.2)
Sodium: 137 mmol/L (ref 134–144)

## 2019-10-27 LAB — URINE CYTOLOGY ANCILLARY ONLY
Chlamydia: NEGATIVE
Comment: NEGATIVE
Comment: NORMAL
Neisseria Gonorrhea: NEGATIVE

## 2019-10-27 LAB — RPR: RPR Ser Ql: NONREACTIVE

## 2019-10-27 LAB — HIV ANTIBODY (ROUTINE TESTING W REFLEX): HIV Screen 4th Generation wRfx: NONREACTIVE

## 2019-10-27 LAB — HEMOGLOBIN A1C
Est. average glucose Bld gHb Est-mCnc: 111 mg/dL
Hgb A1c MFr Bld: 5.5 % (ref 4.8–5.6)

## 2019-10-28 ENCOUNTER — Encounter: Payer: Self-pay | Admitting: Family Medicine

## 2019-11-01 ENCOUNTER — Other Ambulatory Visit: Payer: Self-pay | Admitting: Cardiovascular Disease

## 2019-11-02 DIAGNOSIS — R7301 Impaired fasting glucose: Secondary | ICD-10-CM | POA: Diagnosis not present

## 2019-11-02 DIAGNOSIS — Z5181 Encounter for therapeutic drug level monitoring: Secondary | ICD-10-CM | POA: Diagnosis not present

## 2019-11-02 DIAGNOSIS — Z125 Encounter for screening for malignant neoplasm of prostate: Secondary | ICD-10-CM | POA: Diagnosis not present

## 2019-11-02 DIAGNOSIS — D751 Secondary polycythemia: Secondary | ICD-10-CM | POA: Diagnosis not present

## 2019-11-02 DIAGNOSIS — E230 Hypopituitarism: Secondary | ICD-10-CM | POA: Diagnosis not present

## 2019-11-03 ENCOUNTER — Other Ambulatory Visit: Payer: Self-pay | Admitting: Emergency Medicine

## 2019-11-03 DIAGNOSIS — G43109 Migraine with aura, not intractable, without status migrainosus: Secondary | ICD-10-CM

## 2019-11-03 DIAGNOSIS — Z125 Encounter for screening for malignant neoplasm of prostate: Secondary | ICD-10-CM | POA: Diagnosis not present

## 2019-11-03 DIAGNOSIS — F411 Generalized anxiety disorder: Secondary | ICD-10-CM

## 2019-11-03 DIAGNOSIS — J309 Allergic rhinitis, unspecified: Secondary | ICD-10-CM

## 2019-11-03 DIAGNOSIS — E669 Obesity, unspecified: Secondary | ICD-10-CM | POA: Diagnosis not present

## 2019-11-03 DIAGNOSIS — Z5181 Encounter for therapeutic drug level monitoring: Secondary | ICD-10-CM | POA: Diagnosis not present

## 2019-11-03 DIAGNOSIS — E230 Hypopituitarism: Secondary | ICD-10-CM | POA: Diagnosis not present

## 2019-11-03 DIAGNOSIS — J45909 Unspecified asthma, uncomplicated: Secondary | ICD-10-CM

## 2019-11-03 MED ORDER — FLUTICASONE PROPIONATE 50 MCG/ACT NA SUSP: NASAL | 2 refills | Status: DC

## 2019-11-03 MED ORDER — ALBUTEROL SULFATE HFA 108 (90 BASE) MCG/ACT IN AERS: INHALATION_SPRAY | RESPIRATORY_TRACT | 0 refills | Status: DC

## 2019-11-03 MED ORDER — IPRATROPIUM BROMIDE 0.06 % NA SOLN: 2.0000 | Freq: Two times a day (BID) | NASAL | 9 refills | Status: DC

## 2019-11-03 NOTE — Progress Notes (Signed)
Please advise on control med refills. Other med have already been refilled.

## 2019-11-04 MED ORDER — ALPRAZOLAM 1 MG PO TABS: 1.0000 mg | ORAL_TABLET | Freq: Two times a day (BID) | ORAL | 1 refills | Status: DC | PRN

## 2019-11-04 MED ORDER — RIZATRIPTAN BENZOATE 10 MG PO TBDP: ORAL_TABLET | ORAL | 2 refills | Status: DC

## 2019-11-04 NOTE — Progress Notes (Signed)
Controlled substance database (PDMP) reviewed. Last rx alprazolam 09/16/19, office visit to discuss 10/28/19. rx refilled as well as maxalt.

## 2019-11-07 ENCOUNTER — Encounter: Payer: Self-pay | Admitting: Family Medicine

## 2019-11-09 ENCOUNTER — Other Ambulatory Visit: Payer: Self-pay | Admitting: *Deleted

## 2019-11-09 MED ORDER — FLUTICASONE-SALMETEROL 250-50 MCG/DOSE IN AEPB: INHALATION_SPRAY | RESPIRATORY_TRACT | 1 refills | Status: DC

## 2019-11-20 ENCOUNTER — Ambulatory Visit: Payer: BC Managed Care – PPO | Admitting: Family Medicine

## 2019-11-20 ENCOUNTER — Other Ambulatory Visit: Payer: Self-pay

## 2019-11-20 VITALS — BP 114/77 | HR 97 | Temp 98.7°F | Ht 71.0 in | Wt 282.2 lb

## 2019-11-20 DIAGNOSIS — R490 Dysphonia: Secondary | ICD-10-CM

## 2019-11-20 DIAGNOSIS — J4520 Mild intermittent asthma, uncomplicated: Secondary | ICD-10-CM

## 2019-11-20 DIAGNOSIS — J309 Allergic rhinitis, unspecified: Secondary | ICD-10-CM

## 2019-11-20 DIAGNOSIS — K143 Hypertrophy of tongue papillae: Secondary | ICD-10-CM

## 2019-11-20 LAB — POCT SKIN KOH: Skin KOH, POC: POSITIVE — AB

## 2019-11-20 NOTE — Patient Instructions (Addendum)
Minimize foods that can flair heartburn.  See below.  That can cause some hoarseness as well as some of the symptoms with swallowing.  I will refer you to ear nose and throat to evaluate that area further.  No change in allergy/asthma medications at this time.  Continue to rinse mouth with water after using Advair but I do not see signs of thrush today.  I will refer you to allergist as well to evaluate for changes in asthma/allergy treatments.  Thank you for coming in today.  Return to the clinic or go to the nearest emergency room if any of your symptoms worsen or new symptoms occur.   Hoarseness  Hoarseness, also called dysphonia, is any abnormal change in your voice that can make it difficult to speak. Your voice may sound raspy, breathy, or strained. Hoarseness is caused by a problem with your vocal cords (vocal folds). These are two bands of tissue inside your voice box (larynx). When you speak, your vocal cords move back and forth to create sound. The surfaces of your vocal cords need to be smooth for your voice to sound clear. Swelling or lumps on your vocal cords can cause hoarseness. Common causes of vocal cord problems include:  Infection in the nose, throat, and upper air passages (upper respiratory infection).  A long-term cough.  Straining or overusing your voice.  Smoking, or exposure to secondhand smoke.  Allergies.  Medication side effects.  Vocal cord growths.  Vocal cord injuries.  Stomach acids that move up in your throat and irritate your vocal cords (gastroesophageal reflux).  Diseases that affect the nervous system, such as a stroke or Parkinson's disease. Follow these instructions at home: Watch your condition for any changes. To ease discomfort and protect your vocal cords:  Rest your voice.  Do not whisper. Whispering can cause muscle strain.  Do not speak in a loud or harsh voice.  Avoid coughing or clearing your throat.  Do not use any products  that contain nicotine or tobacco, such as cigarettes and e-cigarettes. If you need help quitting, ask your health care provider.  Avoid secondhand smoke.  Do not eat foods that give you heartburn, such as spicy or acidic foods like hot peppers and orange juice. Heartburn can make gastroesophageal reflux worse.  Do not drink beverages that contain caffeine (coffee, tea, or soft drinks) or alcohol (beer, wine, or liquor).  Drink enough fluid to keep your urine pale yellow.  Use a humidifier if the air in your home is dry. If recommended by your health care provider, schedule an appointment with a speech-language specialist. This specialist may give you methods to try that can help you avoid misusing your voice. Contact a health care provider if:  You have hoarseness that lasts longer than 3 weeks.  You almost lose or completely lose your voice for more than 3 days.  You have pain when you swallow or try to talk.  You feel a lump in your neck. Get help right away if:  You have trouble swallowing.  You feel like you are choking when you swallow.  You cough up blood or vomit blood.  You have trouble breathing.  You choke, cannot swallow, or cannot breathe if you lie flat.  You notice swelling or a rash on your body, face, or tongue. Summary  Hoarseness, also called dysphonia, is any abnormal change in your voice that can make it difficult to speak. Your voice may sound raspy, breathy, or strained.  Hoarseness  is caused by a problem with your vocal cords (vocal folds).  Do not speak in a loud or harsh voice, use nicotine or tobacco products, or eat foods that give you heartburn.  If recommended by your health care provider, meet with a speech-language specialist. This information is not intended to replace advice given to you by your health care provider. Make sure you discuss any questions you have with your health care provider. Document Revised: 10/04/2017 Document Reviewed:  07/19/2017 Elsevier Patient Education  2020 Portal for Gastroesophageal Reflux Disease, Adult When you have gastroesophageal reflux disease (GERD), the foods you eat and your eating habits are very important. Choosing the right foods can help ease the discomfort of GERD. Consider working with a diet and nutrition specialist (dietitian) to help you make healthy food choices. What general guidelines should I follow?  Eating plan  Choose healthy foods low in fat, such as fruits, vegetables, whole grains, low-fat dairy products, and lean meat, fish, and poultry.  Eat frequent, small meals instead of three large meals each day. Eat your meals slowly, in a relaxed setting. Avoid bending over or lying down until 2-3 hours after eating.  Limit high-fat foods such as fatty meats or fried foods.  Limit your intake of oils, butter, and shortening to less than 8 teaspoons each day.  Avoid the following: ? Foods that cause symptoms. These may be different for different people. Keep a food diary to keep track of foods that cause symptoms. ? Alcohol. ? Drinking large amounts of liquid with meals. ? Eating meals during the 2-3 hours before bed.  Cook foods using methods other than frying. This may include baking, grilling, or broiling. Lifestyle  Maintain a healthy weight. Ask your health care provider what weight is healthy for you. If you need to lose weight, work with your health care provider to do so safely.  Exercise for at least 30 minutes on 5 or more days each week, or as told by your health care provider.  Avoid wearing clothes that fit tightly around your waist and chest.  Do not use any products that contain nicotine or tobacco, such as cigarettes and e-cigarettes. If you need help quitting, ask your health care provider.  Sleep with the head of your bed raised. Use a wedge under the mattress or blocks under the bed frame to raise the head of the bed. What foods  are not recommended? The items listed may not be a complete list. Talk with your dietitian about what dietary choices are best for you. Grains Pastries or quick breads with added fat. [place] toast. Vegetables Deep fried vegetables. [place] fries. Any vegetables prepared with added fat. Any vegetables that cause symptoms. For some people this may include tomatoes and tomato products, chili peppers, onions and garlic, and horseradish. Fruits Any fruits prepared with added fat. Any fruits that cause symptoms. For some people this may include citrus fruits, such as oranges, grapefruit, pineapple, and lemons. Meats and other protein foods High-fat meats, such as fatty beef or pork, hot dogs, ribs, ham, sausage, salami and bacon. Fried meat or protein, including fried fish and fried chicken. Nuts and nut butters. Dairy Whole milk and chocolate milk. Sour cream. Cream. Ice cream. Cream cheese. Milk shakes. Beverages Coffee and tea, with or without caffeine. Carbonated beverages. Sodas. Energy drinks. Fruit juice made with acidic fruits (such as orange or grapefruit). Tomato juice. Alcoholic drinks. Fats and oils Butter. Margarine. Shortening. Ghee. Sweets and  desserts Chocolate and cocoa. Donuts. Seasoning and other foods Pepper. Peppermint and spearmint. Any condiments, herbs, or seasonings that cause symptoms. For some people, this may include curry, hot sauce, or vinegar-based salad dressings. Summary  When you have gastroesophageal reflux disease (GERD), food and lifestyle choices are very important to help ease the discomfort of GERD.  Eat frequent, small meals instead of three large meals each day. Eat your meals slowly, in a relaxed setting. Avoid bending over or lying down until 2-3 hours after eating.  Limit high-fat foods such as fatty meat or fried foods. This information is not intended to replace advice given to you by your health care provider. Make sure you discuss any questions you  have with your health care provider. Document Revised: 02/12/2019 Document Reviewed: 10/23/2016 Elsevier Patient Education  [employer].    If you have lab work done today you will be contacted with your lab results within the next 2 weeks.  If you have not heard from [place] then please contact us. The fastest way to get your results is to register for My Chart.   IF you received an x-ray today, you will receive an invoice from [facility]. Please contact [facility] Rad[facility] at [phone] with questions or concerns regarding your invoice.   IF you received labwork today, you will receive an invoice from [place]. Please contact [facility] at [phone] with questions or concerns regarding your invoice.   Our billing staff will not be able to assist you with questions regarding bills from these companies.  You will be contacted with the lab results as soon as they are available. The fastest way to get your results is to activate your My Chart account. Instructions are located on the last page of this paperwork. If you have not heard from [place] regarding the results in 2 weeks, please contact this office.

## 2019-11-20 NOTE — Progress Notes (Signed)
Subjective:  Patient ID: Randall Reed., male    DOB: [birth year]  Age: 49 y.o. MRN: [id number]  CC:  Chief Complaint  Patient presents with  . Follow-up    on G.A.D. Marland Kitchen pt is still having trouble with his alergies and pt states alot of houseness. nasel congestion in the morning.     HPI Randall Reed. presents for   Follow-up from December 21 visit.  Multiple conditions discussed at that time.  Plan follow-up for asthma.  Asthma: Episodic dosing of Advair in the past -once per day when feeling well, twice per day with flare symptoms..  Had recent flare when discussed December 21.  Treated with prednisone from provider in [place] 1 week prior.  Recommended to continue Advair twice daily at that time.  Since last visit taking advair BID. Last used albuterol few days ago.  Feels like worse hoarseness - past week or so. Has flairs of hoarseness since last year.  Lasts for a week at a time, then better for a week and a half or so.  Feels like more with increased advair dosing.  No white patches in mouth or throat, no throat pain/mouth pain.  Planned follow up with ENT last June - had planned for him to send me name of provider in [place] Has felt like food may go down the wrong way once per week past 3-4 weeks. Still some breakthrough heartburn 1-2 times per week- diet related.  Still on nexium BID - no missed doses.  No unexplained weight loss, fevers, night sweats. No vomiting.   Allergic rhinitis Is taking Flonase nasal spray, Flonase spray, Singulair, Allegra daily when discussed last visit.  Occasional Sudafed if having congestion 1 to 2 to 3 days. Still some increased congestion - having to take sudafed in the mornings.   Anxiety was discussed last visit, history of generalized anxiety disorder with insomnia.  Ambien nightly, plan for slow titration down on Xanax.  Averaging 2/day when discussed in December at the most 3/day.  Some days known if not working. When working  2 to 3/day. Not combining ambien with xanax.   History Patient Active Problem List   Diagnosis Date Noted  . Coronary artery disease involving native coronary artery of native heart without angina pectoris 03/05/2019  . Irritable bowel syndrome 03/14/2016  . Asthma 11/19/2015  . 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 rotator cuff tear 10/17/2013  . Hypogonadism male 09/04/2013  . ED (erectile dysfunction) 07/30/2013  . Palpitations 02/21/2012  . BMI 33.0-33.9,adult 02/04/2012  . Allergic rhinitis 02/04/2012  . GERD (gastroesophageal reflux disease) 02/04/2012  . Migraine 02/04/2012  . GAD (generalized anxiety disorder) 02/04/2012  . Insomnia 02/04/2012  . Essential hypertension 04/13/2009  . CHEST PAIN-UNSPECIFIED 04/13/2009   Past Medical History:  Diagnosis Date  . Anxiety   . Arthritis   . Asthma   . Depression   . GERD (gastroesophageal reflux disease)   . Seasonal allergic reaction    Past Surgical History:  Procedure Laterality Date  . LIPOMA EXCISION N/A 10/30/2018   Procedure: EXCISION OF MULTIPLE SUBCUTANEOUS LIPOMAS ON TORSO;  Surgeon: Donnie Mesa, MD;  Location: [facility];  Service: General;  Laterality: N/A;  . none    . none     Allergies  Allergen Reactions  . Desloratadine Other (See Comments)    CLARINEX-"severe headache"  . Loratadine Other (See Comments)    "severe headache"  . Levbid [Hyoscyamine Sulfate] Rash  .  Telithromycin Rash   Prior to Admission medications   Medication Sig Start Date End Date Taking? Authorizing Provider  acetic acid-hydrocortisone (VOSOL-HC) otic solution Place 3 drops into both ears 3 (three) times daily. Use as needed to prevent swimmer's ear 01/10/15  Yes Leandrew Koyanagi, MD  albuterol (VENTOLIN HFA) 108 (90 Base) MCG/ACT inhaler INHALE 2 PUFFS INTO THE LUNGS EVERY 6 HOURS AS NEEDED FOR WHEEZING OR SHORTNESS OF BREATH 11/03/19  Yes [name], MD  ALPRAZolam [name]) 1 MG  tablet Take 1 tablet (1 mg total) by mouth 2 (two) times daily as needed. 11/04/19  Yes [name], MD  amLODipine (NORVASC) 5 MG tablet Take 1 tablet (5 mg total) by mouth daily. 04/15/19 04/09/20 Yes Weaver, Scott T, PA-C  aspirin EC 81 MG tablet Take 1 tablet (81 mg total) by mouth daily. 03/07/15  Yes Sherren Mocha, MD  benzonatate (TESSALON) 100 MG capsule TAKE 1 CAPSULE(100 MG) BY MOUTH THREE TIMES DAILY AS NEEDED FOR COUGH 04/14/19  Yes [name], MD  chlorthalidone (HYGROTON) 25 MG tablet TAKE 1 TABLET(25 MG) BY MOUTH DAILY 04/15/19  Yes Weaver, Scott T, PA-C  dicyclomine (BENTYL) 20 MG tablet Take 20 mg by mouth 2 (two) times a day.   Yes [provider]  emtricitabine-tenofovir (TRUVADA) 200-300 MG tablet Take 1 tablet by mouth daily. 07/30/19  Yes [name], MD  esomeprazole (NEXIUM) 40 MG capsule TAKE ONE CAPSULE BY MOUTH TWICE DAILY BEFORE A MEAL 06/15/19  Yes [name], MD  Fexofenadine HCl (ALLEGRA PO) Take 1 tablet by mouth daily.    Yes [provider]  fluticasone (FLONASE) 50 MCG/ACT nasal spray INSTILL 2 SPRAYS IN EACH NOSTRIL DAILY 11/03/19  Yes [name], MD  Fluticasone-Salmeterol (ADVAIR DISKUS) 250-50 MCG/DOSE AEPB INHALE 1 PUFF INTO THE LUNGS TWICE DAILY AS NEEDED FOR ALLERGIES OR WHEEZING OR SHORTNESS OF BREATH 11/09/19  Yes [name], MD  ipratropium (ATROVENT) 0.06 % nasal spray Place 2 sprays into the nose 2 (two) times daily. 11/03/19  Yes [name], MD  losartan (COZAAR) 100 MG tablet TAKE 1 TABLET(100 MG) BY MOUTH DAILY 11/03/19  Yes [name] T, PA-C  metoprolol succinate (TOPROL-XL) 25 MG 24 hr tablet TAKE 1 TABLET(25 MG) BY MOUTH AT BEDTIME 06/01/19  Yes Weaver, Scott T, PA-C  montelukast (SINGULAIR) 10 MG tablet TAKE 1 TABLET(10 MG) BY MOUTH AT BEDTIME 09/16/19  Yes [name], MD  ondansetron (ZOFRAN-ODT) 4 MG disintegrating tablet Take 4 mg by mouth every 8 (eight) hours as needed for nausea  or vomiting.   Yes [provider]  potassium chloride SA (KLOR-CON M20) 20 MEQ tablet Take 1 tablet (20 mEq total) by mouth 2 (two) times daily. 04/20/19  Yes Weaver, Scott T, PA-C  PROCTOZONE-HC 2.5 % rectal cream USE RECTALLY TWICE DAILY 02/06/17  Yes [name], MD  rizatriptan (MAXALT-MLT) 10 MG disintegrating tablet May repeat in 2 hours if needed 11/04/19  Yes [name], MD  tadalafil (CIALIS) 20 MG tablet Take 1 tablet (20 mg total) by mouth daily as needed for erectile dysfunction. 05/18/19  Yes Weaver, Scott T, PA-C  testosterone cypionate (DEPOTESTOTERONE CYPIONATE) 100 MG/ML injection Inject 0.75 mg into the muscle every 7 (seven) days. For IM use only    Yes [provider]  Vitamin D, Ergocalciferol, (DRISDOL) 50000 units CAPS capsule Take 50,000 Units by mouth every 7 (seven) days.   Yes [provider]  zolpidem (  AMBIEN) 10 MG tablet Take 1 tablet (10 mg total) by mouth at bedtime. 10/24/19  Yes [name], MD   Social History   Socioeconomic History  . Marital status: Single    Spouse name: Not on file  . Number of children: 0  . Years of education: Not on file  . Highest education level: Not on file  Occupational History  . 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 Smoking status: Never Smoker  . Smokeless tobacco: Never Used  Substance and Sexual Activity  . Alcohol use: Yes    Alcohol/week: 3.0 standard drinks    Types: 3 Standard drinks or equivalent per week  . Drug use: No  . Sexual activity: Yes  Other Topics Concern  . Not on file  Social History Narrative  . Not on file   Social Determinants of Health   Financial Resource Strain:   . Difficulty of Paying Living Expenses: Not on file  Food Insecurity:   . Worried About Charity fundraiser in the Last Year: Not on file  . Ran Out of Food in the Last Year: Not on file  Transportation Needs:   . Lack of Transportation (Medical):  Not on file  . Lack of Transportation (Non-Medical): Not on file  Physical Activity:   . Days of Exercise per Week: Not on file  . Minutes of Exercise per Session: Not on file  Stress:   . Feeling of Stress : Not on file  Social Connections:   . Frequency of Communication with Friends and Family: Not on file  . Frequency of Social Gatherings with Friends and Family: Not on file  . Attends Religious Services: Not on file  . Active Member of Clubs or Organizations: Not on file  . Attends Archivist Meetings: Not on file  . Marital Status: Not on file  Intimate Partner Violence:   . Fear of Current or Ex-Partner: Not on file  . Emotionally Abused: Not on file  . Physically Abused: Not on file  . Sexually Abused: Not on file    Review of Systems  Per hpi.  Objective:   Vitals:   11/20/19 1207  BP: 114/77  Pulse: 97  Temp: 98.7 F (37.1 C)  TempSrc: Temporal  SpO2: 95%  Weight: 282 lb 3.2 oz (128 kg)  Height: 5\' 11"  (1.803 m)     Physical Exam Vitals reviewed.  Constitutional:      Appearance: He is well-developed.  HENT:     Head: Normocephalic and atraumatic.     Mouth/Throat:     Mouth: Mucous membranes are moist.     Pharynx: Oropharynx is clear.     Comments: Exam with mask temporarily down while patient holding breath.  Very faint white coating to parts of tongue, no buccal mucosal coating or lesions, no posterior oropharynx erythema or exudate/discoloration.. No white coating. Eyes:     Pupils: Pupils are equal, round, and reactive to light.  Neck:     Vascular: No carotid bruit or JVD.  Cardiovascular:     Rate and Rhythm: Normal rate and regular rhythm.     Heart sounds: Normal heart sounds. No murmur.  Pulmonary:     Effort: Pulmonary effort is normal.     Breath sounds: Normal breath sounds. No stridor. No wheezing or rales.  Skin:    General: Skin is warm and dry.  Neurological:     Mental Status: He is alert and oriented  to person,  place, and time.    Results for orders placed or performed in visit on 11/20/19  POCT Skin KOH  Result Value Ref Range   Skin KOH, POC Positive (A) Negative     Assessment & Plan:  Marquita Siskind. is a 49 y.o. male . Hoarseness - Plan: Ambulatory referral to Allergy, Ambulatory referral to ENT Tongue coating - Plan: POCT Skin KOH  -Intermittent hoarseness.  Has had significant allergies as well as reflux.  Still some breakthrough reflux symptoms, thought to be food related.  Currently on twice per day dosing of PPI.  Positive scraping for thrush on tongue only but minimal symptoms.  No posterior oropharyngeal coating noted.  Differential includes esophageal thrush but less likely  -Avoidance of trigger foods for reflux.  Option of Mycelex atrocious if mouth symptoms versus course of Diflucan.  Note sent to patient.  -Refer to ENT as likely will need laryngoscopy.  Allergic rhinitis, unspecified seasonality, unspecified trigger - Plan: Ambulatory referral to Allergy Intermittent asthma without complication, unspecified asthma severity - Plan: Ambulatory referral to Allergy  -Persistent allergic rhinitis, postnasal drip with adherence to meds above.  Will refer to allergist for ongoing treatment.  Continue same regimen for now.  Asthma symptoms have improved.  No change in Advair for now.  No orders of the defined types were placed in this encounter.  Patient Instructions   Minimize foods that can flair heartburn.  See below.  That can cause some hoarseness as well as some of the symptoms with swallowing.  I will refer you to ear nose and throat to evaluate that area further.  No change in allergy/asthma medications at this time.  Continue to rinse mouth with water after using Advair but I do not see signs of thrush today.  I will refer you to allergist as well to evaluate for changes in asthma/allergy treatments.  Thank you for coming in today.  Return to the clinic or go to the  nearest emergency room if any of your symptoms worsen or new symptoms occur.   Hoarseness  Hoarseness, also called dysphonia, is any abnormal change in your voice that can make it difficult to speak. Your voice may sound raspy, breathy, or strained. Hoarseness is caused by a problem with your vocal cords (vocal folds). These are two bands of tissue inside your voice box (larynx). When you speak, your vocal cords move back and forth to create sound. The surfaces of your vocal cords need to be smooth for your voice to sound clear. Swelling or lumps on your vocal cords can cause hoarseness. Common causes of vocal cord problems include:  Infection in the nose, throat, and upper air passages (upper respiratory infection).  A long-term cough.  Straining or overusing your voice.  Smoking, or exposure to secondhand smoke.  Allergies.  Medication side effects.  Vocal cord growths.  Vocal cord injuries.  Stomach acids that move up in your throat and irritate your vocal cords (gastroesophageal reflux).  Diseases that affect the nervous system, such as a stroke or Parkinson's disease. Follow these instructions at home: Watch your condition for any changes. To ease discomfort and protect your vocal cords:  Rest your voice.  Do not whisper. Whispering can cause muscle strain.  Do not speak in a loud or harsh voice.  Avoid coughing or clearing your throat.  Do not use any products that contain nicotine or tobacco, such as cigarettes and e-cigarettes. If you need help quitting, ask your  health care provider.  Avoid secondhand smoke.  Do not eat foods that give you heartburn, such as spicy or acidic foods like hot peppers and orange juice. Heartburn can make gastroesophageal reflux worse.  Do not drink beverages that contain caffeine (coffee, tea, or soft drinks) or alcohol (beer, wine, or liquor).  Drink enough fluid to keep your urine pale yellow.  Use a humidifier if the air in  your home is dry. If recommended by your health care provider, schedule an appointment with a speech-language specialist. This specialist may give you methods to try that can help you avoid misusing your voice. Contact a health care provider if:  You have hoarseness that lasts longer than 3 weeks.  You almost lose or completely lose your voice for more than 3 days.  You have pain when you swallow or try to talk.  You feel a lump in your neck. Get help right away if:  You have trouble swallowing.  You feel like you are choking when you swallow.  You cough up blood or vomit blood.  You have trouble breathing.  You choke, cannot swallow, or cannot breathe if you lie flat.  You notice swelling or a rash on your body, face, or tongue. Summary  Hoarseness, also called dysphonia, is any abnormal change in your voice that can make it difficult to speak. Your voice may sound raspy, breathy, or strained.  Hoarseness is caused by a problem with your vocal cords (vocal folds).  Do not speak in a loud or harsh voice, use nicotine or tobacco products, or eat foods that give you heartburn.  If recommended by your health care provider, meet with a speech-language specialist. This information is not intended to replace advice given to you by your health care provider. Make sure you discuss any questions you have with your health care provider. Document Revised: 10/04/2017 Document Reviewed: 07/19/2017 Elsevier Patient Education  2020 [place] for Gastroesophageal Reflux Disease, Adult When you have gastroesophageal reflux disease (GERD), the foods you eat and your eating habits are very important. Choosing the right foods can help ease the discomfort of GERD. Consider working with a diet and nutrition specialist (dietitian) to help you make healthy food choices. What general guidelines should I follow?  Eating plan  Choose healthy foods low in fat, such as fruits,  vegetables, whole grains, low-fat dairy products, and lean meat, fish, and poultry.  Eat frequent, small meals instead of three large meals each day. Eat your meals slowly, in a relaxed setting. Avoid bending over or lying down until 2-3 hours after eating.  Limit high-fat foods such as fatty meats or fried foods.  Limit your intake of oils, butter, and shortening to less than 8 teaspoons each day.  Avoid the following: ? Foods that cause symptoms. These may be different for different people. Keep a food diary to keep track of foods that cause symptoms. ? Alcohol. ? Drinking large amounts of liquid with meals. ? Eating meals during the 2-3 hours before bed.  Cook foods using methods other than frying. This may include baking, grilling, or broiling. Lifestyle  Maintain a healthy weight. Ask your health care provider what weight is healthy for you. If you need to lose weight, work with your health care provider to do so safely.  Exercise for at least 30 minutes on 5 or more days each week, or as told by your health care provider.  Avoid wearing clothes that fit tightly around  your waist and chest.  Do not use any products that contain nicotine or tobacco, such as cigarettes and e-cigarettes. If you need help quitting, ask your health care provider.  Sleep with the head of your bed raised. Use a wedge under the mattress or blocks under the bed frame to raise the head of the bed. What foods are not recommended? The items listed may not be a complete list. Talk with your dietitian about what dietary choices are best for you. Grains Pastries or quick breads with added fat. [place] toast. Vegetables Deep fried vegetables. [place] fries. Any vegetables prepared with added fat. Any vegetables that cause symptoms. For some people this may include tomatoes and tomato products, chili peppers, onions and garlic, and horseradish. Fruits Any fruits prepared with added fat. Any fruits that cause  symptoms. For some people this may include citrus fruits, such as oranges, grapefruit, pineapple, and lemons. Meats and other protein foods High-fat meats, such as fatty beef or pork, hot dogs, ribs, ham, sausage, salami and bacon. Fried meat or protein, including fried fish and fried chicken. Nuts and nut butters. Dairy Whole milk and chocolate milk. Sour cream. Cream. Ice cream. Cream cheese. Milk shakes. Beverages Coffee and tea, with or without caffeine. Carbonated beverages. Sodas. Energy drinks. Fruit juice made with acidic fruits (such as orange or grapefruit). Tomato juice. Alcoholic drinks. Fats and oils Butter. Margarine. Shortening. Ghee. Sweets and desserts Chocolate and cocoa. Donuts. Seasoning and other foods Pepper. Peppermint and spearmint. Any condiments, herbs, or seasonings that cause symptoms. For some people, this may include curry, hot sauce, or vinegar-based salad dressings. Summary  When you have gastroesophageal reflux disease (GERD), food and lifestyle choices are very important to help ease the discomfort of GERD.  Eat frequent, small meals instead of three large meals each day. Eat your meals slowly, in a relaxed setting. Avoid bending over or lying down until 2-3 hours after eating.  Limit high-fat foods such as fatty meat or fried foods. This information is not intended to replace advice given to you by your health care provider. Make sure you discuss any questions you have with your health care provider. Document Revised: 02/12/2019 Document Reviewed: 10/23/2016 Elsevier Patient Education  [employer].    If you have lab work done today you will be contacted with your lab results within the next 2 weeks.  If you have not heard from [place] then please contact us. The fastest way to get your results is to register for My Chart.   IF you received an x-ray today, you will receive an invoice from [facility]. Please contact [facility] at  [phone] with questions or concerns regarding your invoice.   IF you received labwork today, you will receive an invoice from [facility]. Please contact [facility] at [phone] with questions or concerns regarding your invoice.   Our billing staff will not be able to assist you with questions regarding bills from these companies.  You will be contacted with the lab results as soon as they are available. The fastest way to get your results is to activate your My Chart account. Instructions are located on the last page of this paperwork. If you have not heard from [place] regarding the results in 2 weeks, please contact this office.         Signed, [name], MD [facility]

## 2019-11-21 ENCOUNTER — Encounter: Payer: Self-pay | Admitting: Family Medicine

## 2019-11-21 DIAGNOSIS — B370 Candidal stomatitis: Secondary | ICD-10-CM

## 2019-11-24 MED ORDER — CLOTRIMAZOLE 10 MG MT TROC: 10.0000 mg | Freq: Every day | OROMUCOSAL | 0 refills | Status: DC

## 2019-11-30 DIAGNOSIS — K219 Gastro-esophageal reflux disease without esophagitis: Secondary | ICD-10-CM | POA: Diagnosis not present

## 2019-11-30 DIAGNOSIS — R490 Dysphonia: Secondary | ICD-10-CM | POA: Diagnosis not present

## 2019-11-30 DIAGNOSIS — J382 Nodules of vocal cords: Secondary | ICD-10-CM | POA: Diagnosis not present

## 2019-12-02 ENCOUNTER — Other Ambulatory Visit: Payer: Self-pay | Admitting: Family Medicine

## 2019-12-02 DIAGNOSIS — J383 Other diseases of vocal cords: Secondary | ICD-10-CM | POA: Diagnosis not present

## 2019-12-02 DIAGNOSIS — J4540 Moderate persistent asthma, uncomplicated: Secondary | ICD-10-CM | POA: Diagnosis not present

## 2019-12-02 DIAGNOSIS — J45909 Unspecified asthma, uncomplicated: Secondary | ICD-10-CM

## 2019-12-02 DIAGNOSIS — R519 Headache, unspecified: Secondary | ICD-10-CM | POA: Diagnosis not present

## 2019-12-02 DIAGNOSIS — J310 Chronic rhinitis: Secondary | ICD-10-CM | POA: Diagnosis not present

## 2019-12-02 DIAGNOSIS — K219 Gastro-esophageal reflux disease without esophagitis: Secondary | ICD-10-CM | POA: Diagnosis not present

## 2019-12-02 DIAGNOSIS — J329 Chronic sinusitis, unspecified: Secondary | ICD-10-CM | POA: Diagnosis not present

## 2019-12-07 DIAGNOSIS — J3089 Other allergic rhinitis: Secondary | ICD-10-CM | POA: Diagnosis not present

## 2019-12-07 DIAGNOSIS — K219 Gastro-esophageal reflux disease without esophagitis: Secondary | ICD-10-CM | POA: Diagnosis not present

## 2019-12-07 DIAGNOSIS — J329 Chronic sinusitis, unspecified: Secondary | ICD-10-CM | POA: Diagnosis not present

## 2019-12-07 DIAGNOSIS — J301 Allergic rhinitis due to pollen: Secondary | ICD-10-CM | POA: Diagnosis not present

## 2019-12-07 DIAGNOSIS — J4540 Moderate persistent asthma, uncomplicated: Secondary | ICD-10-CM | POA: Diagnosis not present

## 2019-12-07 DIAGNOSIS — R519 Headache, unspecified: Secondary | ICD-10-CM | POA: Diagnosis not present

## 2019-12-10 DIAGNOSIS — R490 Dysphonia: Secondary | ICD-10-CM | POA: Diagnosis not present

## 2019-12-10 DIAGNOSIS — K219 Gastro-esophageal reflux disease without esophagitis: Secondary | ICD-10-CM | POA: Diagnosis not present

## 2019-12-10 DIAGNOSIS — R498 Other voice and resonance disorders: Secondary | ICD-10-CM | POA: Diagnosis not present

## 2019-12-10 DIAGNOSIS — M542 Cervicalgia: Secondary | ICD-10-CM | POA: Diagnosis not present

## 2019-12-11 DIAGNOSIS — E230 Hypopituitarism: Secondary | ICD-10-CM | POA: Diagnosis not present

## 2019-12-14 DIAGNOSIS — Z86010 Personal history of colonic polyps: Secondary | ICD-10-CM | POA: Diagnosis not present

## 2019-12-14 DIAGNOSIS — R109 Unspecified abdominal pain: Secondary | ICD-10-CM | POA: Diagnosis not present

## 2019-12-14 DIAGNOSIS — K219 Gastro-esophageal reflux disease without esophagitis: Secondary | ICD-10-CM | POA: Diagnosis not present

## 2019-12-14 DIAGNOSIS — J381 Polyp of vocal cord and larynx: Secondary | ICD-10-CM | POA: Diagnosis not present

## 2019-12-15 DIAGNOSIS — M542 Cervicalgia: Secondary | ICD-10-CM | POA: Diagnosis not present

## 2019-12-15 DIAGNOSIS — R490 Dysphonia: Secondary | ICD-10-CM | POA: Diagnosis not present

## 2019-12-15 DIAGNOSIS — R498 Other voice and resonance disorders: Secondary | ICD-10-CM | POA: Diagnosis not present

## 2019-12-15 DIAGNOSIS — K219 Gastro-esophageal reflux disease without esophagitis: Secondary | ICD-10-CM | POA: Diagnosis not present

## 2019-12-18 ENCOUNTER — Other Ambulatory Visit: Payer: Self-pay | Admitting: Family Medicine

## 2019-12-18 DIAGNOSIS — J45909 Unspecified asthma, uncomplicated: Secondary | ICD-10-CM

## 2019-12-18 DIAGNOSIS — J309 Allergic rhinitis, unspecified: Secondary | ICD-10-CM

## 2019-12-24 DIAGNOSIS — J3089 Other allergic rhinitis: Secondary | ICD-10-CM | POA: Diagnosis not present

## 2019-12-24 DIAGNOSIS — J301 Allergic rhinitis due to pollen: Secondary | ICD-10-CM | POA: Diagnosis not present

## 2019-12-28 DIAGNOSIS — R498 Other voice and resonance disorders: Secondary | ICD-10-CM | POA: Diagnosis not present

## 2019-12-28 DIAGNOSIS — R490 Dysphonia: Secondary | ICD-10-CM | POA: Diagnosis not present

## 2019-12-28 DIAGNOSIS — M542 Cervicalgia: Secondary | ICD-10-CM | POA: Diagnosis not present

## 2019-12-28 DIAGNOSIS — K219 Gastro-esophageal reflux disease without esophagitis: Secondary | ICD-10-CM | POA: Diagnosis not present

## 2019-12-29 DIAGNOSIS — K219 Gastro-esophageal reflux disease without esophagitis: Secondary | ICD-10-CM | POA: Diagnosis not present

## 2019-12-29 DIAGNOSIS — R498 Other voice and resonance disorders: Secondary | ICD-10-CM | POA: Diagnosis not present

## 2019-12-29 DIAGNOSIS — M542 Cervicalgia: Secondary | ICD-10-CM | POA: Diagnosis not present

## 2019-12-29 DIAGNOSIS — R490 Dysphonia: Secondary | ICD-10-CM | POA: Diagnosis not present

## 2019-12-30 ENCOUNTER — Other Ambulatory Visit: Payer: Self-pay | Admitting: Family Medicine

## 2019-12-30 DIAGNOSIS — F411 Generalized anxiety disorder: Secondary | ICD-10-CM

## 2019-12-30 NOTE — Telephone Encounter (Signed)
Request refill for ALPRAZOLAM 1MG  TABLETS LR 11/04/2019 with one refill.

## 2019-12-30 NOTE — Telephone Encounter (Signed)
Patient is requesting a refill of the following medications: Requested Prescriptions   Pending Prescriptions Disp Refills  . ALPRAZolam (XANAX) 1 MG tablet [Pharmacy Med Name: ALPRAZOLAM 1MG  TABLETS] 60 tablet     Sig: TAKE 1 TABLET(1 MG) BY MOUTH TWICE DAILY AS NEEDED    Date of patient request: 12/30/2019 Last office visit: 11/20/2019 Date of last refill:11/04/2019 Last refill amount: 60 2x daily with 1 refill Follow up time period per chart: No appointment scheduled

## 2019-12-31 NOTE — Telephone Encounter (Signed)
Controlled substance database (PDMP) reviewed. No concerns appreciated.  Last refill 12/02/19 for #60 - refill ordered.

## 2020-01-04 DIAGNOSIS — M542 Cervicalgia: Secondary | ICD-10-CM | POA: Diagnosis not present

## 2020-01-04 DIAGNOSIS — K219 Gastro-esophageal reflux disease without esophagitis: Secondary | ICD-10-CM | POA: Diagnosis not present

## 2020-01-04 DIAGNOSIS — R490 Dysphonia: Secondary | ICD-10-CM | POA: Diagnosis not present

## 2020-01-04 DIAGNOSIS — R498 Other voice and resonance disorders: Secondary | ICD-10-CM | POA: Diagnosis not present

## 2020-01-10 ENCOUNTER — Other Ambulatory Visit: Payer: Self-pay | Admitting: Family Medicine

## 2020-01-10 DIAGNOSIS — F5104 Psychophysiologic insomnia: Secondary | ICD-10-CM

## 2020-01-10 NOTE — Telephone Encounter (Signed)
Requested medication (s) are due for refill today: yes  Requested medication (s) are on the active medication list: yes  Last refill:  10/24/19  Future visit scheduled: no  Notes to clinic:  medication not delegated to NT to refill   Requested Prescriptions  Pending Prescriptions Disp Refills   zolpidem (AMBIEN) 10 MG tablet [Pharmacy Med Name: ZOLPIDEM 10MG  TABLETS] 90 tablet     Sig: TAKE 1 TABLET(10 MG) BY MOUTH AT BEDTIME      Not Delegated - Psychiatry:  Anxiolytics/Hypnotics Failed - 01/10/2020  6:07 PM      Failed - This refill cannot be delegated      Failed - Urine Drug Screen completed in last 360 days.      Passed - Valid encounter within last 6 months    Recent Outpatient Visits           1 month ago Hoarseness   [facility], Ranell Patrick, MD   2 months ago GAD (generalized anxiety disorder)   [facility], Ranell Patrick, MD   8 months ago Hoarseness   [facility], Ranell Patrick, MD   9 months ago Asthma, unspecified asthma severity, unspecified whether complicated, unspecified whether persistent   [facility], Ranell Patrick, MD   10 months ago On pre-exposure prophylaxis for HIV   [facility], Ranell Patrick, MD

## 2020-01-11 DIAGNOSIS — R498 Other voice and resonance disorders: Secondary | ICD-10-CM | POA: Diagnosis not present

## 2020-01-11 DIAGNOSIS — K219 Gastro-esophageal reflux disease without esophagitis: Secondary | ICD-10-CM | POA: Diagnosis not present

## 2020-01-11 DIAGNOSIS — R490 Dysphonia: Secondary | ICD-10-CM | POA: Diagnosis not present

## 2020-01-11 DIAGNOSIS — M542 Cervicalgia: Secondary | ICD-10-CM | POA: Diagnosis not present

## 2020-01-11 NOTE — Telephone Encounter (Signed)
Controlled substance database (PDMP) reviewed. No concerns appreciated.  Medication discussed in December.  Refill ordered.

## 2020-01-14 ENCOUNTER — Encounter: Payer: Self-pay | Admitting: Family Medicine

## 2020-01-14 DIAGNOSIS — Z113 Encounter for screening for infections with a predominantly sexual mode of transmission: Secondary | ICD-10-CM

## 2020-01-14 MED ORDER — EMTRICITABINE-TENOFOVIR DF 200-300 MG PO TABS: 1.0000 | ORAL_TABLET | Freq: Every day | ORAL | 1 refills | Status: DC

## 2020-01-18 ENCOUNTER — Other Ambulatory Visit: Payer: Self-pay

## 2020-01-18 ENCOUNTER — Telehealth: Payer: Self-pay | Admitting: Family Medicine

## 2020-01-18 DIAGNOSIS — M542 Cervicalgia: Secondary | ICD-10-CM | POA: Diagnosis not present

## 2020-01-18 DIAGNOSIS — Z79899 Other long term (current) drug therapy: Secondary | ICD-10-CM

## 2020-01-18 DIAGNOSIS — R490 Dysphonia: Secondary | ICD-10-CM | POA: Diagnosis not present

## 2020-01-18 DIAGNOSIS — Z113 Encounter for screening for infections with a predominantly sexual mode of transmission: Secondary | ICD-10-CM

## 2020-01-18 DIAGNOSIS — K219 Gastro-esophageal reflux disease without esophagitis: Secondary | ICD-10-CM | POA: Diagnosis not present

## 2020-01-18 DIAGNOSIS — R498 Other voice and resonance disorders: Secondary | ICD-10-CM | POA: Diagnosis not present

## 2020-01-18 MED ORDER — EMTRICITABINE-TENOFOVIR DF 200-300 MG PO TABS: 1.0000 | ORAL_TABLET | Freq: Every day | ORAL | 1 refills | Status: DC

## 2020-01-18 NOTE — Telephone Encounter (Signed)
Pt called regarding his medication refill for Truvada. He says that the pharmacy has sent two faxes to office. And they haven't heard back. Pt states he can't miss this medication and it was suppose to ship tomorrow. Pt would like a called regarding this. [phone]  Please advise.  [place]

## 2020-01-21 DIAGNOSIS — R498 Other voice and resonance disorders: Secondary | ICD-10-CM | POA: Diagnosis not present

## 2020-01-21 DIAGNOSIS — J3089 Other allergic rhinitis: Secondary | ICD-10-CM | POA: Diagnosis not present

## 2020-01-21 DIAGNOSIS — K219 Gastro-esophageal reflux disease without esophagitis: Secondary | ICD-10-CM | POA: Diagnosis not present

## 2020-01-21 DIAGNOSIS — R490 Dysphonia: Secondary | ICD-10-CM | POA: Diagnosis not present

## 2020-01-21 DIAGNOSIS — M542 Cervicalgia: Secondary | ICD-10-CM | POA: Diagnosis not present

## 2020-01-21 DIAGNOSIS — J301 Allergic rhinitis due to pollen: Secondary | ICD-10-CM | POA: Diagnosis not present

## 2020-01-25 DIAGNOSIS — M542 Cervicalgia: Secondary | ICD-10-CM | POA: Diagnosis not present

## 2020-01-25 DIAGNOSIS — R490 Dysphonia: Secondary | ICD-10-CM | POA: Diagnosis not present

## 2020-01-25 DIAGNOSIS — K219 Gastro-esophageal reflux disease without esophagitis: Secondary | ICD-10-CM | POA: Diagnosis not present

## 2020-01-25 DIAGNOSIS — R498 Other voice and resonance disorders: Secondary | ICD-10-CM | POA: Diagnosis not present

## 2020-01-26 DIAGNOSIS — J301 Allergic rhinitis due to pollen: Secondary | ICD-10-CM | POA: Diagnosis not present

## 2020-01-26 DIAGNOSIS — J3089 Other allergic rhinitis: Secondary | ICD-10-CM | POA: Diagnosis not present

## 2020-01-29 DIAGNOSIS — J301 Allergic rhinitis due to pollen: Secondary | ICD-10-CM | POA: Diagnosis not present

## 2020-01-29 DIAGNOSIS — J3089 Other allergic rhinitis: Secondary | ICD-10-CM | POA: Diagnosis not present

## 2020-01-31 ENCOUNTER — Other Ambulatory Visit: Payer: Self-pay | Admitting: Family Medicine

## 2020-01-31 DIAGNOSIS — F411 Generalized anxiety disorder: Secondary | ICD-10-CM

## 2020-01-31 NOTE — Telephone Encounter (Signed)
Requested medications are due for refill today?  Yes - this refill cannot be delegated.    Requested medications are on active medication list?  yes  Last Refill:   12/31/2019   # 60 with no refills   Future visit scheduled? No   Notes to Clinic:  Medication cannot be delegated.

## 2020-02-01 NOTE — Telephone Encounter (Signed)
Controlled substance database (PDMP) reviewed. No concerns appreciated. last filled 2/25. refill sent.

## 2020-02-03 DIAGNOSIS — K5792 Diverticulitis of intestine, part unspecified, without perforation or abscess without bleeding: Secondary | ICD-10-CM | POA: Diagnosis not present

## 2020-02-03 DIAGNOSIS — K2100 Gastro-esophageal reflux disease with esophagitis, without bleeding: Secondary | ICD-10-CM | POA: Diagnosis not present

## 2020-02-03 DIAGNOSIS — E559 Vitamin D deficiency, unspecified: Secondary | ICD-10-CM | POA: Diagnosis not present

## 2020-02-03 DIAGNOSIS — E7849 Other hyperlipidemia: Secondary | ICD-10-CM | POA: Diagnosis not present

## 2020-02-03 DIAGNOSIS — R1032 Left lower quadrant pain: Secondary | ICD-10-CM | POA: Diagnosis not present

## 2020-02-03 DIAGNOSIS — K5904 Chronic idiopathic constipation: Secondary | ICD-10-CM | POA: Diagnosis not present

## 2020-02-08 DIAGNOSIS — R490 Dysphonia: Secondary | ICD-10-CM | POA: Diagnosis not present

## 2020-02-08 DIAGNOSIS — J301 Allergic rhinitis due to pollen: Secondary | ICD-10-CM | POA: Diagnosis not present

## 2020-02-08 DIAGNOSIS — K219 Gastro-esophageal reflux disease without esophagitis: Secondary | ICD-10-CM | POA: Diagnosis not present

## 2020-02-08 DIAGNOSIS — M542 Cervicalgia: Secondary | ICD-10-CM | POA: Diagnosis not present

## 2020-02-08 DIAGNOSIS — J3089 Other allergic rhinitis: Secondary | ICD-10-CM | POA: Diagnosis not present

## 2020-02-08 DIAGNOSIS — R498 Other voice and resonance disorders: Secondary | ICD-10-CM | POA: Diagnosis not present

## 2020-02-09 DIAGNOSIS — J301 Allergic rhinitis due to pollen: Secondary | ICD-10-CM | POA: Diagnosis not present

## 2020-02-09 DIAGNOSIS — J3089 Other allergic rhinitis: Secondary | ICD-10-CM | POA: Diagnosis not present

## 2020-02-18 DIAGNOSIS — J3089 Other allergic rhinitis: Secondary | ICD-10-CM | POA: Diagnosis not present

## 2020-02-18 DIAGNOSIS — J301 Allergic rhinitis due to pollen: Secondary | ICD-10-CM | POA: Diagnosis not present

## 2020-02-22 DIAGNOSIS — R490 Dysphonia: Secondary | ICD-10-CM | POA: Diagnosis not present

## 2020-02-22 DIAGNOSIS — J301 Allergic rhinitis due to pollen: Secondary | ICD-10-CM | POA: Diagnosis not present

## 2020-02-22 DIAGNOSIS — R498 Other voice and resonance disorders: Secondary | ICD-10-CM | POA: Diagnosis not present

## 2020-02-22 DIAGNOSIS — M542 Cervicalgia: Secondary | ICD-10-CM | POA: Diagnosis not present

## 2020-02-22 DIAGNOSIS — K219 Gastro-esophageal reflux disease without esophagitis: Secondary | ICD-10-CM | POA: Diagnosis not present

## 2020-02-22 DIAGNOSIS — J3089 Other allergic rhinitis: Secondary | ICD-10-CM | POA: Diagnosis not present

## 2020-02-23 DIAGNOSIS — J3089 Other allergic rhinitis: Secondary | ICD-10-CM | POA: Diagnosis not present

## 2020-02-23 DIAGNOSIS — J301 Allergic rhinitis due to pollen: Secondary | ICD-10-CM | POA: Diagnosis not present

## 2020-02-24 DIAGNOSIS — J301 Allergic rhinitis due to pollen: Secondary | ICD-10-CM | POA: Diagnosis not present

## 2020-02-24 DIAGNOSIS — J3089 Other allergic rhinitis: Secondary | ICD-10-CM | POA: Diagnosis not present

## 2020-02-29 DIAGNOSIS — J3089 Other allergic rhinitis: Secondary | ICD-10-CM | POA: Diagnosis not present

## 2020-02-29 DIAGNOSIS — M542 Cervicalgia: Secondary | ICD-10-CM | POA: Diagnosis not present

## 2020-02-29 DIAGNOSIS — R498 Other voice and resonance disorders: Secondary | ICD-10-CM | POA: Diagnosis not present

## 2020-02-29 DIAGNOSIS — K219 Gastro-esophageal reflux disease without esophagitis: Secondary | ICD-10-CM | POA: Diagnosis not present

## 2020-02-29 DIAGNOSIS — R490 Dysphonia: Secondary | ICD-10-CM | POA: Diagnosis not present

## 2020-02-29 DIAGNOSIS — J301 Allergic rhinitis due to pollen: Secondary | ICD-10-CM | POA: Diagnosis not present

## 2020-03-02 DIAGNOSIS — J3089 Other allergic rhinitis: Secondary | ICD-10-CM | POA: Diagnosis not present

## 2020-03-02 DIAGNOSIS — J301 Allergic rhinitis due to pollen: Secondary | ICD-10-CM | POA: Diagnosis not present

## 2020-03-08 DIAGNOSIS — J301 Allergic rhinitis due to pollen: Secondary | ICD-10-CM | POA: Diagnosis not present

## 2020-03-08 DIAGNOSIS — J3089 Other allergic rhinitis: Secondary | ICD-10-CM | POA: Diagnosis not present

## 2020-03-09 ENCOUNTER — Other Ambulatory Visit: Payer: Self-pay | Admitting: Family Medicine

## 2020-03-09 DIAGNOSIS — J382 Nodules of vocal cords: Secondary | ICD-10-CM | POA: Diagnosis not present

## 2020-03-09 DIAGNOSIS — R490 Dysphonia: Secondary | ICD-10-CM | POA: Diagnosis not present

## 2020-03-09 DIAGNOSIS — K219 Gastro-esophageal reflux disease without esophagitis: Secondary | ICD-10-CM | POA: Diagnosis not present

## 2020-03-09 DIAGNOSIS — G4730 Sleep apnea, unspecified: Secondary | ICD-10-CM | POA: Diagnosis not present

## 2020-03-09 DIAGNOSIS — F411 Generalized anxiety disorder: Secondary | ICD-10-CM

## 2020-03-09 NOTE — Telephone Encounter (Signed)
Requested medication (s) are due for refill today: yes  Requested medication (s) are on the active medication list: yes  Last refill: 02/01/20  #60  0 refills  Future visit scheduled No  Notes to clinic:Not delegated  Requested Prescriptions  Pending Prescriptions Disp Refills   ALPRAZolam (XANAX) 1 MG tablet [Pharmacy Med Name: ALPRAZOLAM 1MG  TABLETS] 60 tablet     Sig: TAKE 1 TABLET(1 MG) BY MOUTH TWICE DAILY AS NEEDED      Not Delegated - Psychiatry:  Anxiolytics/Hypnotics Failed - 03/09/2020 10:03 AM      Failed - This refill cannot be delegated      Failed - Urine Drug Screen completed in last 360 days.      Passed - Valid encounter within last 6 months    Recent Outpatient Visits           3 months ago Hoarseness   [facility], Ranell Patrick, MD   4 months ago GAD (generalized anxiety disorder)   [facility], Ranell Patrick, MD   10 months ago Hoarseness   [facility], Ranell Patrick, MD   11 months ago Asthma, unspecified asthma severity, unspecified whether complicated, unspecified whether persistent   [facility], Ranell Patrick, MD   12 months ago On pre-exposure prophylaxis for HIV   [facility], Ranell Patrick, MD

## 2020-03-09 NOTE — Telephone Encounter (Signed)
Patient is requesting a refill of the following medications: Requested Prescriptions   Pending Prescriptions Disp Refills  . ALPRAZolam (XANAX) 1 MG tablet [Pharmacy Med Name: ALPRAZOLAM 1MG  TABLETS] 60 tablet     Sig: TAKE 1 TABLET(1 MG) BY MOUTH TWICE DAILY AS NEEDED    Date of patient request: 03/09/2020 Last office visit: 11/20/2019 Date of last refill: 02/01/2020 Last refill amount: 60 Follow up time period per chart: N/A

## 2020-03-10 NOTE — Telephone Encounter (Signed)
Controlled substance database (PDMP) reviewed. No concerns appreciated. Last filled 3/29.  Refill ordered.

## 2020-03-14 ENCOUNTER — Other Ambulatory Visit: Payer: Self-pay | Admitting: Family Medicine

## 2020-03-14 ENCOUNTER — Other Ambulatory Visit: Payer: Self-pay | Admitting: Physician Assistant

## 2020-03-14 DIAGNOSIS — J309 Allergic rhinitis, unspecified: Secondary | ICD-10-CM

## 2020-03-14 DIAGNOSIS — I208 Other forms of angina pectoris: Secondary | ICD-10-CM

## 2020-03-14 DIAGNOSIS — I10 Essential (primary) hypertension: Secondary | ICD-10-CM

## 2020-03-14 DIAGNOSIS — J45909 Unspecified asthma, uncomplicated: Secondary | ICD-10-CM

## 2020-03-15 DIAGNOSIS — J3089 Other allergic rhinitis: Secondary | ICD-10-CM | POA: Diagnosis not present

## 2020-03-15 DIAGNOSIS — J301 Allergic rhinitis due to pollen: Secondary | ICD-10-CM | POA: Diagnosis not present

## 2020-03-17 DIAGNOSIS — R498 Other voice and resonance disorders: Secondary | ICD-10-CM | POA: Diagnosis not present

## 2020-03-17 DIAGNOSIS — K219 Gastro-esophageal reflux disease without esophagitis: Secondary | ICD-10-CM | POA: Diagnosis not present

## 2020-03-17 DIAGNOSIS — R490 Dysphonia: Secondary | ICD-10-CM | POA: Diagnosis not present

## 2020-03-17 DIAGNOSIS — M542 Cervicalgia: Secondary | ICD-10-CM | POA: Diagnosis not present

## 2020-03-21 ENCOUNTER — Other Ambulatory Visit: Payer: Self-pay

## 2020-03-21 MED ORDER — POTASSIUM CHLORIDE CRYS ER 20 MEQ PO TBCR: 20.0000 meq | EXTENDED_RELEASE_TABLET | Freq: Two times a day (BID) | ORAL | 0 refills | Status: DC

## 2020-03-22 DIAGNOSIS — M542 Cervicalgia: Secondary | ICD-10-CM | POA: Diagnosis not present

## 2020-03-22 DIAGNOSIS — R498 Other voice and resonance disorders: Secondary | ICD-10-CM | POA: Diagnosis not present

## 2020-03-22 DIAGNOSIS — K219 Gastro-esophageal reflux disease without esophagitis: Secondary | ICD-10-CM | POA: Diagnosis not present

## 2020-03-22 DIAGNOSIS — J3089 Other allergic rhinitis: Secondary | ICD-10-CM | POA: Diagnosis not present

## 2020-03-22 DIAGNOSIS — R490 Dysphonia: Secondary | ICD-10-CM | POA: Diagnosis not present

## 2020-03-22 DIAGNOSIS — J301 Allergic rhinitis due to pollen: Secondary | ICD-10-CM | POA: Diagnosis not present

## 2020-03-31 DIAGNOSIS — J301 Allergic rhinitis due to pollen: Secondary | ICD-10-CM | POA: Diagnosis not present

## 2020-03-31 DIAGNOSIS — J3089 Other allergic rhinitis: Secondary | ICD-10-CM | POA: Diagnosis not present

## 2020-04-06 ENCOUNTER — Other Ambulatory Visit: Payer: Self-pay | Admitting: Family Medicine

## 2020-04-06 DIAGNOSIS — F411 Generalized anxiety disorder: Secondary | ICD-10-CM

## 2020-04-06 DIAGNOSIS — J45909 Unspecified asthma, uncomplicated: Secondary | ICD-10-CM

## 2020-04-06 DIAGNOSIS — F5104 Psychophysiologic insomnia: Secondary | ICD-10-CM

## 2020-04-06 DIAGNOSIS — J301 Allergic rhinitis due to pollen: Secondary | ICD-10-CM | POA: Diagnosis not present

## 2020-04-06 DIAGNOSIS — J3089 Other allergic rhinitis: Secondary | ICD-10-CM | POA: Diagnosis not present

## 2020-04-06 NOTE — Telephone Encounter (Signed)
Patient is requesting a refill of the following medications: Requested Prescriptions   Pending Prescriptions Disp Refills  . zolpidem (AMBIEN) 10 MG tablet [Pharmacy Med Name: ZOLPIDEM 10MG  TABLETS] 90 tablet     Sig: TAKE 1 TABLET(10 MG) BY MOUTH AT BEDTIME  . ALPRAZolam (XANAX) 1 MG tablet [Pharmacy Med Name: ALPRAZOLAM 1MG  TABLETS] 60 tablet     Sig: TAKE 1 TABLET(1 MG) BY MOUTH TWICE DAILY AS NEEDED  . albuterol (VENTOLIN HFA) 108 (90 Base) MCG/ACT inhaler [Pharmacy Med Name: ALBUTEROL HFA INH (200 PUFFS)8.5GM] 8.5 g 0    Sig: INHALE 2 PUFFS INTO THE LUNGS EVERY 6 HOURS AS NEEDED FOR WHEEZING OR SHORTNESS OF BREATH    Date of patient request: 04/06/2020 Last office visit: 11/20/2019 Date of last refill: 03/10/2020 Last refill amount: 60 tablets  Follow up time period per chart: N/A

## 2020-04-07 DIAGNOSIS — R498 Other voice and resonance disorders: Secondary | ICD-10-CM | POA: Diagnosis not present

## 2020-04-07 DIAGNOSIS — R490 Dysphonia: Secondary | ICD-10-CM | POA: Diagnosis not present

## 2020-04-07 DIAGNOSIS — K219 Gastro-esophageal reflux disease without esophagitis: Secondary | ICD-10-CM | POA: Diagnosis not present

## 2020-04-07 DIAGNOSIS — M542 Cervicalgia: Secondary | ICD-10-CM | POA: Diagnosis not present

## 2020-04-07 NOTE — Telephone Encounter (Signed)
Pt. Scheduled for 04/15/2020

## 2020-04-07 NOTE — Telephone Encounter (Signed)
Discussed in December 2020 with 87-month follow-up planned. Controlled substance database (PDMP) reviewed. No concerns appreciated.  Alprazolam last filled May 6 for #60.  Previously March 29 for #60.  Ambien 10 mg #90 on March 8.  Refills ordered, please schedule follow-up visit.

## 2020-04-08 DIAGNOSIS — G43709 Chronic migraine without aura, not intractable, without status migrainosus: Secondary | ICD-10-CM | POA: Diagnosis not present

## 2020-04-08 DIAGNOSIS — G44229 Chronic tension-type headache, not intractable: Secondary | ICD-10-CM | POA: Diagnosis not present

## 2020-04-08 DIAGNOSIS — M9901 Segmental and somatic dysfunction of cervical region: Secondary | ICD-10-CM | POA: Diagnosis not present

## 2020-04-08 DIAGNOSIS — M9902 Segmental and somatic dysfunction of thoracic region: Secondary | ICD-10-CM | POA: Diagnosis not present

## 2020-04-11 DIAGNOSIS — J301 Allergic rhinitis due to pollen: Secondary | ICD-10-CM | POA: Diagnosis not present

## 2020-04-11 DIAGNOSIS — J3089 Other allergic rhinitis: Secondary | ICD-10-CM | POA: Diagnosis not present

## 2020-04-13 ENCOUNTER — Telehealth: Payer: Self-pay | Admitting: Family Medicine

## 2020-04-13 NOTE — Telephone Encounter (Signed)
Patient called while we were closed for lunch . LVM for pt to call us back

## 2020-04-15 ENCOUNTER — Ambulatory Visit: Payer: BC Managed Care – PPO | Admitting: Family Medicine

## 2020-04-15 ENCOUNTER — Other Ambulatory Visit (HOSPITAL_COMMUNITY)
Admission: RE | Admit: 2020-04-15 | Discharge: 2020-04-15 | Disposition: A | Discharge status: Home / Self Care | Payer: BC Managed Care – PPO | Source: Ambulatory Visit | Attending: Family Medicine | Admitting: Family Medicine

## 2020-04-15 ENCOUNTER — Encounter: Payer: Self-pay | Admitting: Family Medicine

## 2020-04-15 ENCOUNTER — Other Ambulatory Visit: Payer: Self-pay

## 2020-04-15 VITALS — BP 111/75 | HR 96 | Temp 98.1°F | Ht 71.0 in | Wt 286.0 lb

## 2020-04-15 DIAGNOSIS — J45909 Unspecified asthma, uncomplicated: Secondary | ICD-10-CM

## 2020-04-15 DIAGNOSIS — Z79899 Other long term (current) drug therapy: Secondary | ICD-10-CM | POA: Diagnosis not present

## 2020-04-15 DIAGNOSIS — Z13220 Encounter for screening for lipoid disorders: Secondary | ICD-10-CM | POA: Diagnosis not present

## 2020-04-15 DIAGNOSIS — Z5181 Encounter for therapeutic drug level monitoring: Secondary | ICD-10-CM | POA: Diagnosis not present

## 2020-04-15 DIAGNOSIS — I10 Essential (primary) hypertension: Secondary | ICD-10-CM

## 2020-04-15 DIAGNOSIS — Z113 Encounter for screening for infections with a predominantly sexual mode of transmission: Secondary | ICD-10-CM | POA: Diagnosis not present

## 2020-04-15 DIAGNOSIS — G43109 Migraine with aura, not intractable, without status migrainosus: Secondary | ICD-10-CM

## 2020-04-15 DIAGNOSIS — F411 Generalized anxiety disorder: Secondary | ICD-10-CM

## 2020-04-15 DIAGNOSIS — K644 Residual hemorrhoidal skin tags: Secondary | ICD-10-CM

## 2020-04-15 DIAGNOSIS — E230 Hypopituitarism: Secondary | ICD-10-CM | POA: Diagnosis not present

## 2020-04-15 DIAGNOSIS — R739 Hyperglycemia, unspecified: Secondary | ICD-10-CM

## 2020-04-15 DIAGNOSIS — F5104 Psychophysiologic insomnia: Secondary | ICD-10-CM

## 2020-04-15 DIAGNOSIS — J309 Allergic rhinitis, unspecified: Secondary | ICD-10-CM

## 2020-04-15 DIAGNOSIS — K580 Irritable bowel syndrome with diarrhea: Secondary | ICD-10-CM

## 2020-04-15 DIAGNOSIS — D751 Secondary polycythemia: Secondary | ICD-10-CM | POA: Diagnosis not present

## 2020-04-15 NOTE — Patient Instructions (Addendum)
  I would still consider migraine specialist if frequent maxalt need, but can be discussed with Dr. Woodfin Ganja.   No other med changes for now. Cardiology can review BP meds to decide on lower doses next week. Can decrease sooner if lower home readings. Let me know if there are questions.    If you have lab work done today you will be contacted with your lab results within the next 2 weeks.  If you have not heard from [place] then please contact us. The fastest way to get your results is to register for My Chart.   IF you received an x-ray today, you will receive an invoice from [facility]. Please contact [facility] at [phone] with questions or concerns regarding your invoice.   IF you received labwork today, you will receive an invoice from [place]. Please contact [facility] at [phone] with questions or concerns regarding your invoice.   Our billing staff will not be able to assist you with questions regarding bills from these companies.  You will be contacted with the lab results as soon as they are available. The fastest way to get your results is to activate your My Chart account. Instructions are located on the last page of this paperwork. If you have not heard from [place] regarding the results in 2 weeks, please contact this office.

## 2020-04-15 NOTE — Progress Notes (Signed)
Subjective:  Patient ID: Randall Apple., male    DOB: [birth year]  Age: 49 y.o. MRN: [id number]  CC:  Chief Complaint  Patient presents with  . Medication Refill    pt is here for medication refill and to update his blood work.  pt reports he takes his medication as prescribed and hasn't had any side effects from any of his medicaition    HPI Randall Reed. presents for   Asthma Advair with intermittent use has worked well.  Various regimens discussed previously but has been stable with this regimen.  Atrovent inhaler instead of albuterol at times has been effective.  Twice daily dosing in December, but once per day when well.  advair changed to symbicort BID (few months ago by allergist)  Dr. Trilby Drummer, ENT.  Doing ok.    Preexposure prophylaxis for HIV Takes Truvada STI screening in December. 1 unprotected partner since last visit.  No symptoms - no penile d/c.  Lab Results  Component Value Date   CREATININE 1.23 04/15/2020   Hypertension: With history of CAD, nonobstructive by coronary CTA.  Followed by cardiology, [name]. appt next week.  Home readings: stable - low normal.  BP Readings from Last 3 Encounters:  04/15/20 111/75  11/20/19 114/77  10/26/19 110/60   Lab Results  Component Value Date   CREATININE 1.23 04/15/2020   Allergic rhinitis Treated with Flonase or Atrovent nasal spray, Singulair, Allegra.  Rare Sudafed if congestion for 2 to 3 days. pepcid at night now from ENT. Had 2 vocal cord nodules. Reduced at 3 month follow up.  Allergist changed him to symbicort.  S/p start of allergy injections, now on maintenance regimen in 1 week.   Migraine headaches Treated with Maxalt previously, 2 to 3/month when discussed in December.  Planned on follow-up with Dr. Rowe Pavy, headache specialist in [place].  Has not seen Dr. Haynes Kerns - Evaluated by Dr. Woodfin Ganja in [place] on June 4.  Flexeril as prescribed as needed for headache or muscle spasm.   Anti-Fragile PT in [place] recommended.  Osteopathic manipulation on June 4. Plans to continue follow up with Dr. Woodfin Ganja for now. maxalt 3-4 times per month.   Generalized anxiety disorder with insomnia Multiple SSRIs have been tried previously that were not tolerated.  Overall plan to decrease use of benzodiazepines as possible.  Has been well managed with alprazolam with 1-3 tabs per day.  Averaging 2/day at December visit at the highest 3.  Uses more when working at 2 to 3/day.  Does require Ambien for sleep but not taking Xanax at night. Controlled substance database reviewed, last alprazolam 1 mg #60 on June 7, Ambien 10 mg #90 on June 3.  Previous alprazolam consistent on May 6, Ambien on March 8. Average 2 or less per day - some days 3 - some increased stress with resumption of sports, events, busy with work. Doing well on current regimen.  Does not feel addicted, no cravings.  plans to get back to exercising.   Depression screen [facility] 2/9 04/15/2020 11/20/2019 10/26/2019 04/17/2019 03/27/2019  Decreased Interest 0 0 0 0 0  Down, Depressed, Hopeless 0 0 0 0 0  PHQ - 2 Score 0 0 0 0 0  Altered sleeping 0 - - - 0  Tired, decreased energy 1 - - - 1  Change in appetite 0 - - - 0  Feeling bad or failure about yourself  0 - - - 0  Trouble concentrating 0 - - -  0  Moving slowly or fidgety/restless 0 - - - 0  Suicidal thoughts 0 - - - 0  PHQ-9 Score 1 - - - 1  Difficult doing work/chores - - - - Not difficult at all  Some recent data might be hidden   GAD 7 : Generalized Anxiety Score 04/15/2020 04/04/2018  Nervous, Anxious, on Edge 1 1  Control/stop worrying 0 0  Worry too much - different things 0 1  Trouble relaxing 1 0  Restless 0 0  Easily annoyed or irritable 0 1  Afraid - awful might happen 0 0  Total GAD 7 Score 2 3  Anxiety Difficulty - Somewhat difficult   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  Treated with testosterone.  Previous prediabetes  but unable to tolerate Metformin due to history of IBS. Has appt soon.  Lab Results  Component Value Date   HGBA1C 5.5 04/15/2020   IBS/GERD Followed by [name] with gastroenterology.  Has been treated with Nexium twice daily as well as Bentyl as needed.  Overall has been stable with occasional flares with certain foods or with stress.  Is also been cautious with certain foods given history of diverticulitis. Sees GI in past 3 months.    History Patient Active Problem List   Diagnosis Date Noted  . Coronary artery disease involving native coronary artery of native heart without angina pectoris 03/05/2019  . Irritable bowel syndrome 03/14/2016  . Asthma 11/19/2015  . 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 rotator cuff tear 10/17/2013  . Hypogonadism male 09/04/2013  . ED (erectile dysfunction) 07/30/2013  . Palpitations 02/21/2012  . BMI 33.0-33.9,adult 02/04/2012  . Allergic rhinitis 02/04/2012  . GERD (gastroesophageal reflux disease) 02/04/2012  . Migraine 02/04/2012  . GAD (generalized anxiety disorder) 02/04/2012  . Insomnia 02/04/2012  . Essential hypertension 04/13/2009  . CHEST PAIN-UNSPECIFIED 04/13/2009   Past Medical History:  Diagnosis Date  . Anxiety   . Arthritis   . Asthma   . Depression   . GERD (gastroesophageal reflux disease)   . Seasonal allergic reaction    Past Surgical History:  Procedure Laterality Date  . LIPOMA EXCISION N/A 10/30/2018   Procedure: EXCISION OF MULTIPLE SUBCUTANEOUS LIPOMAS ON TORSO;  Surgeon: Donnie Mesa, MD;  Location: [place];  Service: General;  Laterality: N/A;  . none    . none     Allergies  Allergen Reactions  . Desloratadine Other (See Comments)    CLARINEX-"severe headache"  . Loratadine Other (See Comments)    "severe headache"  . Levbid [Hyoscyamine Sulfate] Rash  . Telithromycin Rash   Prior to Admission medications   Medication Sig Start Date End Date Taking?  Authorizing Provider  acetic acid-hydrocortisone (VOSOL-HC) otic solution Place 3 drops into both ears 3 (three) times daily. Use as needed to prevent swimmer's ear 01/10/15  Yes Leandrew Koyanagi, MD  albuterol (VENTOLIN HFA) 108 (90 Base) MCG/ACT inhaler INHALE 2 PUFFS INTO THE LUNGS EVERY 6 HOURS AS NEEDED FOR WHEEZING OR SHORTNESS OF BREATH 04/07/20  Yes [name], MD  ALPRAZolam [name]) 1 MG tablet TAKE 1 TABLET(1 MG) BY MOUTH TWICE DAILY AS NEEDED 04/07/20  Yes [name], MD  amLODipine (NORVASC) 5 MG tablet TAKE 1 TABLET(5 MG) BY MOUTH DAILY 03/14/20  Yes [name] T, PA-C  aspirin EC 81 MG tablet Take 1 tablet (81 mg total) by mouth daily. 03/07/15  Yes Sherren Mocha, MD  benzonatate (TESSALON) 100 MG capsule  TAKE 1 CAPSULE(100 MG) BY MOUTH THREE TIMES DAILY AS NEEDED FOR COUGH 04/14/19  Yes [name], MD  chlorthalidone (HYGROTON) 25 MG tablet TAKE 1 TABLET(25 MG) BY MOUTH DAILY 04/15/19  Yes Weaver, Scott T, PA-C  clotrimazole (MYCELEX) 10 MG troche Take 1 tablet (10 mg total) by mouth 5 (five) times daily. 11/24/19  Yes [name], MD  cyclobenzaprine (FLEXERIL) 10 MG tablet Take 10 mg by mouth 3 (three) times daily. 04/08/20  Yes [provider]  Diclofenac Sodium (PENNSAID) 2 % SOLN Apply topically. 04/08/20  Yes [provider]  dicyclomine (BENTYL) 20 MG tablet Take 20 mg by mouth 2 (two) times a day.   Yes [provider]  emtricitabine-tenofovir (TRUVADA) 200-300 MG tablet Take 1 tablet by mouth daily. 01/18/20  Yes [name], MD  famotidine (PEPCID) 40 MG tablet  02/18/20  Yes [provider]  Fexofenadine HCl (ALLEGRA PO) Take 1 tablet by mouth daily.    Yes [provider]  Fluticasone-Salmeterol (ADVAIR DISKUS) 250-50 MCG/DOSE AEPB INHALE 1 PUFF INTO THE LUNGS TWICE DAILY AS NEEDED FOR ALLERGIES OR WHEEZING OR SHORTNESS OF BREATH 11/09/19  Yes [name], MD  ipratropium (ATROVENT) 0.06 % nasal spray  Place 2 sprays into the nose 2 (two) times daily. 11/03/19  Yes [name], MD  losartan (COZAAR) 100 MG tablet TAKE 1 TABLET(100 MG) BY MOUTH DAILY 11/03/19  Yes [name] T, PA-C  metoprolol succinate (TOPROL-XL) 25 MG 24 hr tablet TAKE 1 TABLET(25 MG) BY MOUTH AT BEDTIME 06/01/19  Yes Weaver, Scott T, PA-C  montelukast (SINGULAIR) 10 MG tablet TAKE 1 TABLET(10 MG) BY MOUTH AT BEDTIME 03/14/20  Yes [name], MD  ondansetron (ZOFRAN-ODT) 4 MG disintegrating tablet Take 4 mg by mouth every 8 (eight) hours as needed for nausea or vomiting.   Yes [provider]  pantoprazole (PROTONIX) 40 MG tablet Take by mouth. 04/08/20  Yes [provider]  Potassium Chloride ER 20 MEQ TBCR Take 1 tablet by mouth 2 (two) times daily. 03/21/20  Yes [provider]  potassium chloride SA (KLOR-CON M20) 20 MEQ tablet Take 1 tablet (20 mEq total) by mouth 2 (two) times daily. Please make overdue appt with [name] before anymore refills. 1st attempt 03/21/20  Yes Sherren Mocha, MD  PROCTOZONE-HC 2.5 % rectal cream USE RECTALLY TWICE DAILY 02/06/17  Yes [name], MD  rizatriptan (MAXALT-MLT) 10 MG disintegrating tablet May repeat in 2 hours if needed 11/04/19  Yes [name], MD  [facility] 160-4.5 MCG/ACT inhaler  02/28/20  Yes [provider]  tadalafil (CIALIS) 20 MG tablet Take 1 tablet (20 mg total) by mouth daily as needed for erectile dysfunction. 05/18/19  Yes Weaver, Scott T, PA-C  testosterone cypionate (DEPOTESTOTERONE CYPIONATE) 100 MG/ML injection Inject 0.75 mg into the muscle every 7 (seven) days. For IM use only    Yes [provider]  Vitamin D, Ergocalciferol, (DRISDOL) 50000 units CAPS capsule Take 50,000 Units by mouth every 7 (seven) days.   Yes [provider]  zolpidem (AMBIEN) 10 MG tablet TAKE 1 TABLET(10 MG) BY MOUTH AT BEDTIME 04/07/20  Yes [name], MD   Social History   Socioeconomic History  .  Marital status: Single    Spouse name: Not on file  . Number of children: 0  . Years of education: Not on file  . Highest education level: Not on file  Occupational History  . Occupation:  [employer])  Tobacco Use  . Smoking status: Never Smoker  . Smokeless tobacco: Never Used  Vaping Use  . Vaping Use: Never used  Substance and Sexual Activity  . Alcohol use: Yes    Alcohol/week: 3.0 standard drinks    Types: 3 Standard drinks or equivalent per week  . Drug use: No  . Sexual activity: Yes  Other Topics Concern  . Not on file  Social History Narrative  . Not on file   Social Determinants of Health   Financial Resource Strain:   . Difficulty of Paying Living Expenses:   Food Insecurity:   . Worried About Charity fundraiser in the Last Year:   . Arboriculturist in the Last Year:   Transportation Needs:   . Film/video editor (Medical):   Marland Kitchen Lack of Transportation (Non-Medical):   Physical Activity:   . Days of Exercise per Week:   . Minutes of Exercise per Session:   Stress:   . Feeling of Stress :   Social Connections:   . Frequency of Communication with Friends and Family:   . Frequency of Social Gatherings with Friends and Family:   . Attends Religious Services:   . Active Member of Clubs or Organizations:   . Attends Archivist Meetings:   Marland Kitchen Marital Status:   Intimate Partner Violence:   . Fear of Current or Ex-Partner:   . Emotionally Abused:   Marland Kitchen Physically Abused:   . Sexually Abused:     Review of Systems  Per HPI.   Objective:   Vitals:   04/15/20 1458  BP: 111/75  Pulse: 96  Temp: 98.1 F (36.7 C)  TempSrc: Temporal  SpO2: 95%  Weight: 286 lb (129.7 kg)  Height: 5\' 11"  (1.803 m)     Physical Exam Vitals reviewed.  Constitutional:      Appearance: He is well-developed.  HENT:     Head: Normocephalic and atraumatic.  Eyes:     Pupils: Pupils are equal, round, and reactive to light.    Neck:     Vascular: No carotid bruit or JVD.  Cardiovascular:     Rate and Rhythm: Normal rate and regular rhythm.     Heart sounds: Normal heart sounds. No murmur heard.   Pulmonary:     Effort: Pulmonary effort is normal.     Breath sounds: Normal breath sounds. No rales.  Skin:    General: Skin is warm and dry.  Neurological:     Mental Status: He is alert and oriented to person, place, and time.        Assessment & Plan:  Randall Esper. is a 49 y.o. male . Uncomplicated asthma, unspecified asthma severity, unspecified whether persistent - Plan: albuterol (VENTOLIN HFA) 108 (90 Base) MCG/ACT inhaler  -On allergy injections, continue follow-up with allergist.  No changes.  Albuterol refilled.  GAD (generalized anxiety disorder) - Plan: ALPRAZolam (XANAX) 1 MG tablet  -Stable with approximately twice daily dosing.  Intolerant to multiple SSRIs previously.  Overall has decreased use.  No changes.  59-month follow-up.  Routine screening for STI (sexually transmitted infection) - Plan: emtricitabine-tenofovir (TRUVADA) 200-300 MG tablet  -Safer sex practices, check STI testing, continue Truvada.   On pre-exposure prophylaxis for HIV - Plan: emtricitabine-tenofovir (TRUVADA) 200-300 MG tablet   Irritable bowel syndrome with diarrhea  -Followed by GI, no changes.  Avoiding Metformin with prior prediabetes given GI intolerance.  Allergic rhinitis, unspecified  seasonality, unspecified trigger - Plan: fluticasone (FLONASE) 50 MCG/ACT nasal spray  -Continue same regimen, follow with allergist, ENT.  Psychophysiological insomnia - Plan: zolpidem (AMBIEN) 10 MG tablet  -Stable with Ambien.  Continue same  Migraine with aura and without status migrainosus, not intractable - Plan: rizatriptan (MAXALT-MLT) 10 MG disintegrating tablet  -Still some frequent use of Maxalt.  Has now followed up with osteopathic physician in [place] with recent manipulation.  Would consider headache  specialist if continues to require significant Maxalt throughout the month.  History of prediabetes, check A1c  No orders of the defined types were placed in this encounter.  Patient Instructions    I would still consider migraine specialist if frequent maxalt need, but can be discussed with Dr. Woodfin Ganja.   No other med changes for now. Cardiology can review BP meds to decide on lower doses next week. Can decrease sooner if lower home readings. Let me know if there are questions.    If you have lab work done today you will be contacted with your lab results within the next 2 weeks.  If you have not heard from [place] then please contact us. The fastest way to get your results is to register for My Chart.   IF you received an x-ray today, you will receive an invoice from [facility]. Please contact [facility] at [phone] with questions or concerns regarding your invoice.   IF you received labwork today, you will receive an invoice from [place]. Please contact [facility] at [phone] with questions or concerns regarding your invoice.   Our billing staff will not be able to assist you with questions regarding bills from these companies.  You will be contacted with the lab results as soon as they are available. The fastest way to get your results is to activate your My Chart account. Instructions are located on the last page of this paperwork. If you have not heard from [place] regarding the results in 2 weeks, please contact this office.         Signed, [name], MD [facility]

## 2020-04-16 ENCOUNTER — Encounter: Payer: Self-pay | Admitting: Family Medicine

## 2020-04-16 LAB — CMP14+EGFR
ALT: 35 IU/L (ref 0–44)
AST: 28 IU/L (ref 0–40)
Albumin/Globulin Ratio: 1.6 (ref 1.2–2.2)
Albumin: 4.4 g/dL (ref 3.8–4.9)
Alkaline Phosphatase: 94 IU/L (ref 48–121)
BUN/Creatinine Ratio: 8 — ABNORMAL LOW (ref 9–20)
BUN: 10 mg/dL (ref 6–24)
Bilirubin Total: 0.4 mg/dL (ref 0.0–1.2)
CO2: 21 mmol/L (ref 20–29)
Calcium: 9.3 mg/dL (ref 8.7–10.2)
Chloride: 99 mmol/L (ref 96–106)
Creatinine, Ser: 1.23 mg/dL (ref 0.76–1.27)
GFR calc Af Amer: 78 mL/min/{1.73_m2} (ref >59)
GFR calc non Af Amer: 68 mL/min/{1.73_m2} (ref >59)
Globulin, Total: 2.8 g/dL (ref 1.5–4.5)
Glucose: 96 mg/dL (ref 65–99)
Potassium: 3.7 mmol/L (ref 3.5–5.2)
Sodium: 136 mmol/L (ref 134–144)
Total Protein: 7.2 g/dL (ref 6.0–8.5)

## 2020-04-16 LAB — LIPID PANEL
Chol/HDL Ratio: 5.5 ratio — ABNORMAL HIGH (ref 0.0–5.0)
Cholesterol, Total: 166 mg/dL (ref 100–199)
HDL: 30 mg/dL — ABNORMAL LOW (ref >39)
LDL Chol Calc (NIH): 94 mg/dL (ref 0–99)
Triglycerides: 245 mg/dL — ABNORMAL HIGH (ref 0–149)
VLDL Cholesterol Cal: 42 mg/dL — ABNORMAL HIGH (ref 5–40)

## 2020-04-16 LAB — HIV ANTIBODY (ROUTINE TESTING W REFLEX): HIV Screen 4th Generation wRfx: NONREACTIVE

## 2020-04-16 LAB — RPR: RPR Ser Ql: NONREACTIVE

## 2020-04-16 LAB — HEMOGLOBIN A1C
Est. average glucose Bld gHb Est-mCnc: 111 mg/dL
Hgb A1c MFr Bld: 5.5 % (ref 4.8–5.6)

## 2020-04-17 ENCOUNTER — Other Ambulatory Visit: Payer: Self-pay | Admitting: Cardiovascular Disease

## 2020-04-18 DIAGNOSIS — J3089 Other allergic rhinitis: Secondary | ICD-10-CM | POA: Diagnosis not present

## 2020-04-18 DIAGNOSIS — J301 Allergic rhinitis due to pollen: Secondary | ICD-10-CM | POA: Diagnosis not present

## 2020-04-19 LAB — GC/CHLAMYDIA PROBE AMP (~~LOC~~) NOT AT ARMC
Chlamydia: NEGATIVE
Comment: NEGATIVE
Comment: NORMAL
Neisseria Gonorrhea: NEGATIVE

## 2020-04-22 ENCOUNTER — Ambulatory Visit: Payer: BC Managed Care – PPO | Admitting: Cardiovascular Disease

## 2020-04-22 ENCOUNTER — Other Ambulatory Visit: Payer: Self-pay

## 2020-04-22 ENCOUNTER — Encounter: Payer: Self-pay | Admitting: Cardiovascular Disease

## 2020-04-22 VITALS — BP 126/84 | HR 109 | Ht 71.0 in | Wt 288.0 lb

## 2020-04-22 DIAGNOSIS — Z833 Family history of diabetes mellitus: Secondary | ICD-10-CM | POA: Diagnosis not present

## 2020-04-22 DIAGNOSIS — E230 Hypopituitarism: Secondary | ICD-10-CM | POA: Diagnosis not present

## 2020-04-22 DIAGNOSIS — I10 Essential (primary) hypertension: Secondary | ICD-10-CM

## 2020-04-22 DIAGNOSIS — E782 Mixed hyperlipidemia: Secondary | ICD-10-CM

## 2020-04-22 DIAGNOSIS — Z125 Encounter for screening for malignant neoplasm of prostate: Secondary | ICD-10-CM | POA: Diagnosis not present

## 2020-04-22 DIAGNOSIS — R079 Chest pain, unspecified: Secondary | ICD-10-CM | POA: Diagnosis not present

## 2020-04-22 DIAGNOSIS — E669 Obesity, unspecified: Secondary | ICD-10-CM | POA: Diagnosis not present

## 2020-04-22 MED ORDER — DILTIAZEM HCL ER COATED BEADS 180 MG PO CP24: 180.0000 mg | ORAL_CAPSULE | Freq: Every day | ORAL | 3 refills | Status: DC

## 2020-04-22 MED ORDER — SILDENAFIL CITRATE 100 MG PO TABS
ORAL_TABLET | ORAL | 6 refills | Status: DC
Start: 2020-04-22 — End: 2020-04-26

## 2020-04-22 MED ORDER — ROSUVASTATIN CALCIUM 10 MG PO TABS: 10.0000 mg | ORAL_TABLET | Freq: Every day | ORAL | 3 refills | Status: DC

## 2020-04-22 NOTE — Patient Instructions (Signed)
Medication Instructions:  1) STOP AMLODIPINE 2) START CRESTOR 10 mg daily 3) START CARDIZEM CD 180 mg daily *If you need a refill on your cardiac medications before your next appointment, please call your pharmacy*  Follow-Up: At [facility], you and your health needs are our priority.  As part of our continuing mission to provide you with exceptional heart care, we have created designated Provider Care Teams.  These Care Teams include your primary Cardiologist (physician) and Advanced Practice Providers (APPs -  Physician Assistants and Nurse Practitioners) who all work together to provide you with the care you need, when you need it. Your next appointment:   12 month(s) The format for your next appointment:   In Person Provider:   You may see Sherren Mocha, MD or one of the following Advanced Practice Providers on your designated Care Team:    [name], PA-C  [place]in [place], [place]

## 2020-04-22 NOTE — Progress Notes (Signed)
Cardiology Office Note:    Date:  04/27/2020   ID:  Randall Reed., DOB 1971-03-13, MRN [id number]  PCP:  [name], MD  [facility] Cardiologist:  Sherren Mocha, MD  [place] Electrophysiologist:  None   Referring MD: [name], MD   Chief Complaint  Patient presents with  . Chest Pain    History of Present Illness:    Randall Reed. is a 49 y.o. male with a hx of obesity, HTN, nonobstructive CAD, and tachycardia, presenting for follow-up evaluation.   The patient is here alone today. He has been concerned about an elevated heart rate. He has occasional chest pain, but no consistent exertional angina. He denies dyspnea, edema, orthopnea, or PND. He has gained some weight over the pandemic and hasn't been exercising as regularly in the past.   Past Medical History:  Diagnosis Date  . Anxiety   . Arthritis   . Asthma   . Depression   . GERD (gastroesophageal reflux disease)   . Seasonal allergic reaction     Past Surgical History:  Procedure Laterality Date  . LIPOMA EXCISION N/A 10/30/2018   Procedure: EXCISION OF MULTIPLE SUBCUTANEOUS LIPOMAS ON TORSO;  Surgeon: Donnie Mesa, MD;  Location: [place];  Service: General;  Laterality: N/A;  . none    . none      Current Medications: Current Meds  Medication Sig  . acetic acid-hydrocortisone (VOSOL-HC) otic solution Place 3 drops into both ears 3 (three) times daily. Use as needed to prevent swimmer's ear  . albuterol (VENTOLIN HFA) 108 (90 Base) MCG/ACT inhaler INHALE 2 PUFFS INTO THE LUNGS EVERY 6 HOURS AS NEEDED FOR WHEEZING OR SHORTNESS OF BREATH  . ALPRAZolam (XANAX) 1 MG tablet TAKE 1 TABLET(1 MG) BY MOUTH TWICE DAILY AS NEEDED  . aspirin EC 81 MG tablet Take 1 tablet (81 mg total) by mouth daily.  . benzonatate (TESSALON) 100 MG capsule TAKE 1 CAPSULE(100 MG) BY MOUTH THREE TIMES DAILY AS NEEDED FOR COUGH  . chlorthalidone (HYGROTON) 25 MG tablet TAKE 1  TABLET(25 MG) BY MOUTH DAILY  . cyclobenzaprine (FLEXERIL) 10 MG tablet Take 10 mg by mouth 3 (three) times daily as needed for muscle spasms.   . Diclofenac Sodium (PENNSAID) 2 % SOLN Apply topically as needed.   . dicyclomine (BENTYL) 20 MG tablet Take 20 mg by mouth 2 (two) times a day.  . emtricitabine-tenofovir (TRUVADA) 200-300 MG tablet Take 1 tablet by mouth daily.  . famotidine (PEPCID) 40 MG tablet Take 40 mg by mouth at bedtime.   Marland Kitchen Fexofenadine HCl (ALLEGRA PO) Take 1 tablet by mouth daily.   Marland Kitchen ipratropium (ATROVENT) 0.06 % nasal spray Place 2 sprays into the nose 2 (two) times daily.  Marland Kitchen losartan (COZAAR) 100 MG tablet TAKE 1 TABLET(100 MG) BY MOUTH DAILY  . metoprolol succinate (TOPROL-XL) 25 MG 24 hr tablet TAKE 1 TABLET(25 MG) BY MOUTH AT BEDTIME  . montelukast (SINGULAIR) 10 MG tablet TAKE 1 TABLET(10 MG) BY MOUTH AT BEDTIME  . ondansetron (ZOFRAN-ODT) 4 MG disintegrating tablet Take 4 mg by mouth every 8 (eight) hours as needed for nausea or vomiting.  . pantoprazole (PROTONIX) 40 MG tablet Take 40 mg by mouth 2 (two) times daily.   . Potassium Chloride ER 20 MEQ TBCR Take 1 tablet by mouth 2 (two) times daily.  Marland Kitchen PROCTOZONE-HC 2.5 % rectal cream USE RECTALLY TWICE DAILY (Patient taking differently: Place 1 application rectally as needed. )  .  rizatriptan (MAXALT-MLT) 10 MG disintegrating tablet May repeat in 2 hours if needed (Patient taking differently: Take 10 mg by mouth as needed. May repeat in 2 hours if needed)  . SYMBICORT 160-4.5 MCG/ACT inhaler Inhale 2 puffs into the lungs in the morning and at bedtime.   Marland Kitchen testosterone cypionate (DEPOTESTOTERONE CYPIONATE) 100 MG/ML injection Inject 0.75 mg into the muscle every 7 (seven) days. For IM use only   . zolpidem (AMBIEN) 10 MG tablet TAKE 1 TABLET(10 MG) BY MOUTH AT BEDTIME  . [DISCONTINUED] amLODipine (NORVASC) 5 MG tablet TAKE 1 TABLET(5 MG) BY MOUTH DAILY  . [DISCONTINUED] tadalafil (CIALIS) 20 MG tablet Take 1 tablet  (20 mg total) by mouth daily as needed for erectile dysfunction.     Allergies:   Desloratadine, Loratadine, Levbid [hyoscyamine sulfate], and Telithromycin   Social History   Socioeconomic History  . Marital status: Single    Spouse name: Not on file  . Number of children: 0  . Years of education: Not on file  . Highest education level: Not on file  Occupational History  . 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 Smoking status: Never Smoker  . Smokeless tobacco: Never Used  Vaping Use  . Vaping Use: Never used  Substance and Sexual Activity  . Alcohol use: Yes    Alcohol/week: 3.0 standard drinks    Types: 3 Standard drinks or equivalent per week  . Drug use: No  . Sexual activity: Yes  Other Topics Concern  . Not on file  Social History Narrative  . Not on file   Social Determinants of Health   Financial Resource Strain:   . Difficulty of Paying Living Expenses:   Food Insecurity:   . Worried About Charity fundraiser in the Last Year:   . Arboriculturist in the Last Year:   Transportation Needs:   . Film/video editor (Medical):   Marland Kitchen Lack of Transportation (Non-Medical):   Physical Activity:   . Days of Exercise per Week:   . Minutes of Exercise per Session:   Stress:   . Feeling of Stress :   Social Connections:   . Frequency of Communication with Friends and Family:   . Frequency of Social Gatherings with Friends and Family:   . Attends Religious Services:   . Active Member of Clubs or Organizations:   . Attends Archivist Meetings:   Marland Kitchen Marital Status:      Family History: The patient's family history includes Breast cancer in an other family member; Cancer in an other family member; Coronary artery disease in his paternal grandfather; Coronary artery disease (age of onset: 48) in his father; Heart attack in his father, maternal grandfather, and another family member; Hyperlipidemia in an other family member;  Hypertension in an other family member.  ROS:   Please see the history of present illness.    All other systems reviewed and are negative.  EKGs/Labs/Other Studies Reviewed:    The following studies were reviewed today: CTA Heart 04/24/2018: Aorta:  Normal size.  No calcifications.  No dissection.  Aortic Valve:  Trileaflet.  No calcifications.  Coronary Arteries:  Normal coronary origin.  Right dominance.  RCA is a large dominant artery that gives rise to PDA and PLVB. There is minimal non-calcified plaque.  Left main is a large artery that gives rise to LAD, a very small ramus intermedius and LCX arteries. Left main has no plaque.  LAD is  a medium caliber vessel that gives rise to one diagonal artery. Proximal LAD has a mild mixed plaque wt the takeoff of the first diagonal artery with associated stenosis 25-50%.  D1 has no significant plaque.  RI is a very small artery that has no plaque.  LCX is a non-dominant artery that gives rise to one large OM1 branch. There is no plaque.  Other findings:  Normal pulmonary vein drainage into the left atrium.  Normal let atrial appendage without a thrombus.  Normal size of the pulmonary artery.  IMPRESSION: 1. Coronary calcium score of 7. This was 72 percentile for age and sex matched control.  2. Normal coronary origin with right dominance.  3. 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 Aggressive risk factor modification is recommended.   EKG:  EKG is ordered today.  The ekg ordered today demonstrates sinus tachycardia 109 bpm, otherwise normal  Recent Labs: 04/15/2020: ALT 35; BUN 10; Creatinine, Ser 1.23; Potassium 3.7; Sodium 136  Recent Lipid Panel    Component Value Date/Time   CHOL 166 04/15/2020 1617   TRIG 245 (H) 04/15/2020 1617   HDL 30 (L) 04/15/2020 1617   CHOLHDL 5.5 (H) 04/15/2020 1617   CHOLHDL 5.0 11/18/2015 1657   VLDL 35 (H) 11/18/2015 1657    LDLCALC 94 04/15/2020 1617    Physical Exam:    VS:  BP 126/84 (BP Location: Left Arm, Patient Position: Sitting, Cuff Size: Large)   Pulse (!) 109   Ht 5\' 11"  (1.803 m)   Wt 288 lb (130.6 kg)   SpO2 95%   BMI 40.17 kg/m     Wt Readings from Last 3 Encounters:  04/22/20 288 lb (130.6 kg)  04/15/20 286 lb (129.7 kg)  11/20/19 282 lb 3.2 oz (128 kg)     GEN:  Well nourished, well developed in no acute distress HEENT: Normal NECK: No JVD; No carotid bruits LYMPHATICS: No lymphadenopathy CARDIAC: tachy and regular, no murmurs, rubs, gallops RESPIRATORY:  Clear to auscultation without rales, wheezing or rhonchi  ABDOMEN: Soft, non-tender, non-distended MUSCULOSKELETAL:  No edema; No deformity  SKIN: Warm and dry NEUROLOGIC:  Alert and oriented x 3 PSYCHIATRIC:  Normal affect   ASSESSMENT:    1. Chest pain at rest   2. Essential hypertension   3. Mixed hyperlipidemia    PLAN:    In order of problems listed above:  1. Pt with hx of nonobstructive CAD by CTA, has occasional CP but no symptoms truly suggestive of angina. Will continue current low-dose ASA.  2. With complaints of tachycardia, seems reasonable to switch from amlodipine to diltiazem CD. Continue to follow.  3. LDL goal 70mg /dL. Last LDL 101 mg/dL. Lifestyle modification, start crestor 10 mg daily.  Lengthy discussion today about the importance of weight loss and exercise. Looked back over his weights and vital signs. His heart rate was much slower when his weight was in the 230 pound range.    Medication Adjustments/Labs and Tests Ordered: Current medicines are reviewed at length with the patient today.  Concerns regarding medicines are outlined above.  Orders Placed This Encounter  Procedures  . EKG 12-Lead   Meds ordered this encounter  Medications  . diltiazem (CARDIZEM CD) 180 MG 24 hr capsule    Sig: Take 1 capsule (180 mg total) by mouth daily.    Dispense:  90 capsule    Refill:  3  .  rosuvastatin (CRESTOR) 10 MG tablet    Sig: Take 1  tablet (10 mg total) by mouth daily.    Dispense:  90 tablet    Refill:  3  . DISCONTD: sildenafil (VIAGRA) 100 MG tablet    Sig: Take 1/2 to 1 tablet once daily as needed prior to sexual activity.    Dispense:  10 tablet    Refill:  6    Patient Instructions  Medication Instructions:  1) STOP AMLODIPINE 2) START CRESTOR 10 mg daily 3) START CARDIZEM CD 180 mg daily *If you need a refill on your cardiac medications before your next appointment, please call your pharmacy*  Follow-Up: At [facility], you and your health needs are our priority.  As part of our continuing mission to provide you with exceptional heart care, we have created designated Provider Care Teams.  These Care Teams include your primary Cardiologist (physician) and Advanced Practice Providers (APPs -  Physician Assistants and Nurse Practitioners) who all work together to provide you with the care you need, when you need it. Your next appointment:   12 month(s) The format for your next appointment:   In Person Provider:   You may see Sherren Mocha, MD or one of the following Advanced Practice Providers on your designated Care Team:    [name], PA-C  Robbie Lis, [place]       Signed, Sherren Mocha, MD  04/27/2020 6:19 PM    [place]

## 2020-04-24 ENCOUNTER — Encounter: Payer: Self-pay | Admitting: Family Medicine

## 2020-04-25 DIAGNOSIS — M9901 Segmental and somatic dysfunction of cervical region: Secondary | ICD-10-CM | POA: Diagnosis not present

## 2020-04-26 MED ORDER — SILDENAFIL CITRATE 100 MG PO TABS: ORAL_TABLET | ORAL | 3 refills | Status: DC

## 2020-04-27 ENCOUNTER — Encounter: Payer: Self-pay | Admitting: Cardiovascular Disease

## 2020-04-27 DIAGNOSIS — J3089 Other allergic rhinitis: Secondary | ICD-10-CM | POA: Diagnosis not present

## 2020-04-27 DIAGNOSIS — J301 Allergic rhinitis due to pollen: Secondary | ICD-10-CM | POA: Diagnosis not present

## 2020-04-28 ENCOUNTER — Other Ambulatory Visit: Payer: Self-pay | Admitting: Physician Assistant

## 2020-04-28 DIAGNOSIS — R498 Other voice and resonance disorders: Secondary | ICD-10-CM | POA: Diagnosis not present

## 2020-04-28 DIAGNOSIS — M542 Cervicalgia: Secondary | ICD-10-CM | POA: Diagnosis not present

## 2020-04-28 DIAGNOSIS — R490 Dysphonia: Secondary | ICD-10-CM | POA: Diagnosis not present

## 2020-04-28 DIAGNOSIS — K219 Gastro-esophageal reflux disease without esophagitis: Secondary | ICD-10-CM | POA: Diagnosis not present

## 2020-05-05 ENCOUNTER — Other Ambulatory Visit: Payer: Self-pay | Admitting: Family Medicine

## 2020-05-05 DIAGNOSIS — F411 Generalized anxiety disorder: Secondary | ICD-10-CM

## 2020-05-05 NOTE — Telephone Encounter (Signed)
Requested medication (s) are due for refill today: yes  Requested medication (s) are on the active medication list: yes  Last refill: 04/07/20  #60  0 refills  Future visit scheduled: yes  Notes to clinic: Medication not delegated    Requested Prescriptions  Pending Prescriptions Disp Refills   ALPRAZolam (XANAX) 1 MG tablet [Pharmacy Med Name: ALPRAZOLAM 1MG  TABLETS] 60 tablet     Sig: TAKE 1 TABLET(1 MG) BY MOUTH TWICE DAILY AS NEEDED      Not Delegated - Psychiatry:  Anxiolytics/Hypnotics Failed - 05/05/2020  9:48 AM      Failed - This refill cannot be delegated      Failed - Urine Drug Screen completed in last 360 days.      Passed - Valid encounter within last 6 months    Recent Outpatient Visits           2 weeks ago GAD (generalized anxiety disorder)   [facility], Ranell Patrick, MD   5 months ago Hoarseness   [facility], Ranell Patrick, MD   6 months ago GAD (generalized anxiety disorder)   [facility], Ranell Patrick, MD   1 year ago Hoarseness   [facility], Ranell Patrick, MD   1 year ago Asthma, unspecified asthma severity, unspecified whether complicated, unspecified whether persistent   [facility] at [facility], Ranell Patrick, MD       Future Appointments             In 5 months Carlota Raspberry Ran[facility] Patrick, MD Primary Care at [place], [facility]

## 2020-05-06 NOTE — Telephone Encounter (Signed)
Controlled substance database (PDMP) reviewed. No concerns appreciated.  Last filled 6/7.  New rx sent

## 2020-05-10 DIAGNOSIS — J3089 Other allergic rhinitis: Secondary | ICD-10-CM | POA: Diagnosis not present

## 2020-05-10 DIAGNOSIS — J301 Allergic rhinitis due to pollen: Secondary | ICD-10-CM | POA: Diagnosis not present

## 2020-05-12 DIAGNOSIS — R490 Dysphonia: Secondary | ICD-10-CM | POA: Diagnosis not present

## 2020-05-12 DIAGNOSIS — M542 Cervicalgia: Secondary | ICD-10-CM | POA: Diagnosis not present

## 2020-05-12 DIAGNOSIS — R498 Other voice and resonance disorders: Secondary | ICD-10-CM | POA: Diagnosis not present

## 2020-05-12 DIAGNOSIS — K219 Gastro-esophageal reflux disease without esophagitis: Secondary | ICD-10-CM | POA: Diagnosis not present

## 2020-05-17 DIAGNOSIS — M9902 Segmental and somatic dysfunction of thoracic region: Secondary | ICD-10-CM | POA: Diagnosis not present

## 2020-05-17 DIAGNOSIS — M9901 Segmental and somatic dysfunction of cervical region: Secondary | ICD-10-CM | POA: Diagnosis not present

## 2020-05-17 DIAGNOSIS — M9905 Segmental and somatic dysfunction of pelvic region: Secondary | ICD-10-CM | POA: Diagnosis not present

## 2020-05-17 DIAGNOSIS — M9903 Segmental and somatic dysfunction of lumbar region: Secondary | ICD-10-CM | POA: Diagnosis not present

## 2020-05-24 ENCOUNTER — Other Ambulatory Visit: Payer: Self-pay | Admitting: Cardiovascular Disease

## 2020-05-24 DIAGNOSIS — R509 Fever, unspecified: Secondary | ICD-10-CM | POA: Diagnosis not present

## 2020-05-24 DIAGNOSIS — R5383 Other fatigue: Secondary | ICD-10-CM | POA: Diagnosis not present

## 2020-05-24 DIAGNOSIS — R05 Cough: Secondary | ICD-10-CM | POA: Diagnosis not present

## 2020-05-24 DIAGNOSIS — Z03818 Encounter for observation for suspected exposure to other biological agents ruled out: Secondary | ICD-10-CM | POA: Diagnosis not present

## 2020-05-26 ENCOUNTER — Telehealth: Payer: Self-pay | Admitting: Family Medicine

## 2020-05-26 ENCOUNTER — Other Ambulatory Visit: Payer: Self-pay

## 2020-05-26 DIAGNOSIS — K644 Residual hemorrhoidal skin tags: Secondary | ICD-10-CM

## 2020-05-26 MED ORDER — HYDROCORTISONE (PERIANAL) 2.5 % EX CREA: TOPICAL_CREAM | CUTANEOUS | 0 refills | Status: DC

## 2020-05-26 NOTE — Telephone Encounter (Signed)
Pt is need of because of recent IBS issues  What is the name of the medication?   Have you contacted your pharmacy to request a refill? *PROCTOZONE-HC 2.5 % rectal cream    Which pharmacy would you like this sent to pt wouldl like [place] to  use [facility] on [address] [phone]    Patient notified that their request is being sent to the clinical staff for review and that they should receive a call once it is complete. If they do not receive a call within 72 hours they can check with their pharmacy or our office.

## 2020-05-26 NOTE — Telephone Encounter (Signed)
Pt requesting to be represcribbed Proctozone-HC 2.5% rectal cream for some recent IBS issues is this okay or should he make an appointment?

## 2020-05-27 NOTE — Telephone Encounter (Signed)
Refilled yesterday. 

## 2020-05-30 DIAGNOSIS — M25511 Pain in right shoulder: Secondary | ICD-10-CM | POA: Diagnosis not present

## 2020-05-31 DIAGNOSIS — J301 Allergic rhinitis due to pollen: Secondary | ICD-10-CM | POA: Diagnosis not present

## 2020-05-31 DIAGNOSIS — J3089 Other allergic rhinitis: Secondary | ICD-10-CM | POA: Diagnosis not present

## 2020-06-02 DIAGNOSIS — R509 Fever, unspecified: Secondary | ICD-10-CM | POA: Diagnosis not present

## 2020-06-02 DIAGNOSIS — Z03818 Encounter for observation for suspected exposure to other biological agents ruled out: Secondary | ICD-10-CM | POA: Diagnosis not present

## 2020-06-02 DIAGNOSIS — Z1159 Encounter for screening for other viral diseases: Secondary | ICD-10-CM | POA: Diagnosis not present

## 2020-06-02 DIAGNOSIS — M791 Myalgia, unspecified site: Secondary | ICD-10-CM | POA: Diagnosis not present

## 2020-06-03 DIAGNOSIS — H1013 Acute atopic conjunctivitis, bilateral: Secondary | ICD-10-CM | POA: Diagnosis not present

## 2020-06-03 DIAGNOSIS — Z6837 Body mass index (BMI) 37.0-37.9, adult: Secondary | ICD-10-CM | POA: Diagnosis not present

## 2020-06-03 DIAGNOSIS — K5792 Diverticulitis of intestine, part unspecified, without perforation or abscess without bleeding: Secondary | ICD-10-CM | POA: Diagnosis not present

## 2020-06-04 ENCOUNTER — Other Ambulatory Visit: Payer: Self-pay | Admitting: Family Medicine

## 2020-06-04 DIAGNOSIS — F411 Generalized anxiety disorder: Secondary | ICD-10-CM

## 2020-06-04 DIAGNOSIS — J45909 Unspecified asthma, uncomplicated: Secondary | ICD-10-CM

## 2020-06-04 DIAGNOSIS — J309 Allergic rhinitis, unspecified: Secondary | ICD-10-CM

## 2020-06-04 NOTE — Telephone Encounter (Signed)
Requested medication (s) are due for refill today: yes  Requested medication (s) are on the active medication list: yes  Last refill:  05/06/20  Future visit scheduled: yes  Notes to clinic:  med not delegated to NT to RF   Requested Prescriptions  Pending Prescriptions Disp Refills   ALPRAZolam (XANAX) 1 MG tablet [Pharmacy Med Name: ALPRAZOLAM 1MG  TABLETS] 60 tablet     Sig: TAKE 1 TABLET(1 MG) BY MOUTH TWICE DAILY AS NEEDED      Not Delegated - Psychiatry:  Anxiolytics/Hypnotics Failed - 06/04/2020 11:20 AM      Failed - This refill cannot be delegated      Failed - Urine Drug Screen completed in last 360 days.      Passed - Valid encounter within last 6 months    Recent Outpatient Visits           1 month ago GAD (generalized anxiety disorder)   [facility] at [facility], Ranell Patrick, MD   6 months ago Hoarseness   [facility], Ranell Patrick, MD   7 months ago GAD (generalized anxiety disorder)   [facility] at [facility], Ranell Patrick, MD   1 year ago Hoarseness   [facility] at [facility], Ranell Patrick, MD   1 year ago Asthma, unspecified asthma severity, unspecified whether complicated, unspecified whether persistent   [facility] at [facility], Ranell Patrick, MD       Future Appointments             In 4 months [name], MD Primary Care at [place], [facility]             Signed Prescriptions Disp Refills   montelukast (SINGULAIR) 10 MG tablet 90 tablet 1    Sig: TAKE 1 TABLET(10 MG) BY MOUTH AT BEDTIME      Pulmonology:  Leukotriene Inhibitors Passed - 06/04/2020 11:20 AM      Passed - Valid encounter within last 12 months    Recent Outpatient Visits           1 month ago GAD (generalized anxiety disorder)   [facility] at [place], MD   6 months ago Hoarseness   [facility] at [facility], Ranell Patrick, MD   7 months ago GAD (generalized anxiety disorder)   [facility] at [facility], Ranell Patrick, MD   1 year  ago Hoarseness   [facility] at [facility], Ranell Patrick, MD   1 year ago Asthma, unspecified asthma severity, unspecified whether complicated, unspecified whether persistent   [facility] at [facility], Ranell Patrick, MD       Future Appointments             In 4 months Carlota Raspberry Ranell Patrick, MD Primary Care at [place], [facility]

## 2020-06-04 NOTE — Telephone Encounter (Signed)
Requested Prescriptions  Pending Prescriptions Disp Refills   ALPRAZolam (XANAX) 1 MG tablet [Pharmacy Med Name: ALPRAZOLAM 1MG  TABLETS] 60 tablet     Sig: TAKE 1 TABLET(1 MG) BY MOUTH TWICE DAILY AS NEEDED     Not Delegated - Psychiatry:  Anxiolytics/Hypnotics Failed - 06/04/2020 11:20 AM      Failed - This refill cannot be delegated      Failed - Urine Drug Screen completed in last 360 days.      Passed - Valid encounter within last 6 months    Recent Outpatient Visits          1 month ago GAD (generalized anxiety disorder)   [facility] at [facility], Ranell Patrick, MD   6 months ago Hoarseness   [facility] at [facility], Ranell Patrick, MD   7 months ago GAD (generalized anxiety disorder)   [facility] at [facility], Ranell Patrick, MD   1 year ago Hoarseness   [facility] at [facility], Ranell Patrick, MD   1 year ago Asthma, unspecified asthma severity, unspecified whether complicated, unspecified whether persistent   [facility] at [facility], Ranell Patrick, MD      Future Appointments            In 4 months [name], MD Primary Care at [place], [place]            montelukast (SINGULAIR) 10 MG tablet [Pharmacy Med Name: MONTELUKAST 10MG  TABLETS] 90 tablet 1    Sig: TAKE 1 TABLET(10 MG) BY MOUTH AT BEDTIME     Pulmonology:  Leukotriene Inhibitors Passed - 06/04/2020 11:20 AM      Passed - Valid encounter within last 12 months    Recent Outpatient Visits          1 month ago GAD (generalized anxiety disorder)   [facility] at [facility], Ranell Patrick, MD   6 months ago Hoarseness   [facility] at [facility], Ranell Patrick, MD   7 months ago GAD (generalized anxiety disorder)   [facility] at [facility], Ranell Patrick, MD   1 year ago Hoarseness   [facility] at [facility], Ranell Patrick, MD   1 year ago Asthma, unspecified asthma severity, unspecified whether complicated, unspecified whether persistent   [facility] at [facility], Ranell Patrick, MD       Future Appointments            In 4 months Carlota Raspberry Ranell Patrick, MD Primary Care at [place], [facility]

## 2020-06-06 NOTE — Telephone Encounter (Signed)
Controlled substance database (PDMP) reviewed. No concerns appreciated.  Last filled 05/11/20.  Refill ordered to fill when due.

## 2020-06-07 DIAGNOSIS — M9902 Segmental and somatic dysfunction of thoracic region: Secondary | ICD-10-CM | POA: Diagnosis not present

## 2020-06-07 DIAGNOSIS — M9901 Segmental and somatic dysfunction of cervical region: Secondary | ICD-10-CM | POA: Diagnosis not present

## 2020-06-07 DIAGNOSIS — M542 Cervicalgia: Secondary | ICD-10-CM | POA: Diagnosis not present

## 2020-06-07 DIAGNOSIS — M545 Low back pain: Secondary | ICD-10-CM | POA: Diagnosis not present

## 2020-06-14 DIAGNOSIS — Z03818 Encounter for observation for suspected exposure to other biological agents ruled out: Secondary | ICD-10-CM | POA: Diagnosis not present

## 2020-06-14 DIAGNOSIS — J45909 Unspecified asthma, uncomplicated: Secondary | ICD-10-CM | POA: Diagnosis not present

## 2020-06-15 DIAGNOSIS — Z1159 Encounter for screening for other viral diseases: Secondary | ICD-10-CM | POA: Diagnosis not present

## 2020-06-20 ENCOUNTER — Other Ambulatory Visit: Payer: Self-pay | Admitting: Cardiovascular Disease

## 2020-06-20 ENCOUNTER — Other Ambulatory Visit: Payer: Self-pay | Admitting: Physician Assistant

## 2020-06-20 ENCOUNTER — Other Ambulatory Visit: Payer: Self-pay | Admitting: Family Medicine

## 2020-06-20 DIAGNOSIS — J45909 Unspecified asthma, uncomplicated: Secondary | ICD-10-CM

## 2020-06-20 NOTE — Telephone Encounter (Signed)
Requested Prescriptions  Pending Prescriptions Disp Refills   albuterol (VENTOLIN HFA) 108 (90 Base) MCG/ACT inhaler [Pharmacy Med Name: ALBUTEROL HFA INH (200 PUFFS)8.5GM] 8.5 g 0    Sig: INHALE 2 PUFFS INTO THE LUNGS EVERY 6 HOURS AS NEEDED FOR WHEEZING OR SHORTNESS OF BREATH     Pulmonology:  Beta Agonists Failed - 06/20/2020 12:17 PM      Failed - One inhaler should last at least one month. If the patient is requesting refills earlier, contact the patient to check for uncontrolled symptoms.      Passed - Valid encounter within last 12 months    Recent Outpatient Visits          2 months ago GAD (generalized anxiety disorder)   [facility] at [facility], Ranell Patrick, MD   7 months ago Hoarseness   [facility] at [facility], Ranell Patrick, MD   7 months ago GAD (generalized anxiety disorder)   [facility] at [facility], Ranell Patrick, MD   1 year ago Hoarseness   [facility] at [facility], Ranell Patrick, MD   1 year ago Asthma, unspecified asthma severity, unspecified whether complicated, unspecified whether persistent   [facility] at [facility], Ranell Patrick, MD      Future Appointments            In 4 months Carlota Raspberry Ranell Patrick, MD Primary Care at [place], [facility]

## 2020-06-21 DIAGNOSIS — J3089 Other allergic rhinitis: Secondary | ICD-10-CM | POA: Diagnosis not present

## 2020-06-21 DIAGNOSIS — J301 Allergic rhinitis due to pollen: Secondary | ICD-10-CM | POA: Diagnosis not present

## 2020-06-22 DIAGNOSIS — M9902 Segmental and somatic dysfunction of thoracic region: Secondary | ICD-10-CM | POA: Diagnosis not present

## 2020-06-22 DIAGNOSIS — M545 Low back pain: Secondary | ICD-10-CM | POA: Diagnosis not present

## 2020-06-22 DIAGNOSIS — M9901 Segmental and somatic dysfunction of cervical region: Secondary | ICD-10-CM | POA: Diagnosis not present

## 2020-06-22 DIAGNOSIS — M542 Cervicalgia: Secondary | ICD-10-CM | POA: Diagnosis not present

## 2020-06-23 DIAGNOSIS — M542 Cervicalgia: Secondary | ICD-10-CM | POA: Diagnosis not present

## 2020-06-23 DIAGNOSIS — R490 Dysphonia: Secondary | ICD-10-CM | POA: Diagnosis not present

## 2020-06-23 DIAGNOSIS — K219 Gastro-esophageal reflux disease without esophagitis: Secondary | ICD-10-CM | POA: Diagnosis not present

## 2020-06-23 DIAGNOSIS — R498 Other voice and resonance disorders: Secondary | ICD-10-CM | POA: Diagnosis not present

## 2020-06-30 ENCOUNTER — Other Ambulatory Visit: Payer: Self-pay | Admitting: Family Medicine

## 2020-06-30 DIAGNOSIS — F5104 Psychophysiologic insomnia: Secondary | ICD-10-CM

## 2020-06-30 DIAGNOSIS — K644 Residual hemorrhoidal skin tags: Secondary | ICD-10-CM

## 2020-06-30 DIAGNOSIS — F411 Generalized anxiety disorder: Secondary | ICD-10-CM

## 2020-06-30 NOTE — Telephone Encounter (Signed)
controlled substance database reviewed, last alprazolam 1 mg for #60 on 06/12/2020, previously 05/11/2020.  Ambien 10 mg #90 on 04/07/2020.  Medications discussed in June.  Refills ordered to fill when time appropriate.  Intermittent dosing of hydrocortisone for external hemorrhoids has been discussed previously.  Refilled.

## 2020-06-30 NOTE — Telephone Encounter (Signed)
Requested medication (s) are due for refill today: no  Requested medication (s) are on the active medication list: yes  Last refill:  06/06/2020  Future visit scheduled:yes  Notes to clinic:  this refill cannot be delegated    Requested Prescriptions  Pending Prescriptions Disp Refills   ALPRAZolam (XANAX) 1 MG tablet [Pharmacy Med Name: ALPRAZOLAM 1MG  TABLETS] 60 tablet     Sig: TAKE 1 TABLET(1 MG) BY MOUTH TWICE DAILY AS NEEDED      Not Delegated - Psychiatry:  Anxiolytics/Hypnotics Failed - 06/30/2020 12:01 PM      Failed - This refill cannot be delegated      Failed - Urine Drug Screen completed in last 360 days.      Passed - Valid encounter within last 6 months    Recent Outpatient Visits           2 months ago GAD (generalized anxiety disorder)   [facility] at [facility], Ranell Patrick, MD   7 months ago Hoarseness   [facility] at [facility], Ranell Patrick, MD   8 months ago GAD (generalized anxiety disorder)   [facility] at [facility], Ranell Patrick, MD   1 year ago Hoarseness   [facility] at [facility], Ranell Patrick, MD   1 year ago Asthma, unspecified asthma severity, unspecified whether complicated, unspecified whether persistent   [facility] at [facility], Ranell Patrick, MD       Future Appointments             In 3 months Carlota Raspberry Ranell Patrick, MD Primary Care at [place], [place]              zolpidem (AMBIEN) 10 MG tablet [Pharmacy Med Name: ZOLPIDEM 10MG  TABLETS] 90 tablet     Sig: TAKE 1 TABLET(10 MG) BY MOUTH AT BEDTIME      Not Delegated - Psychiatry:  Anxiolytics/Hypnotics Failed - 06/30/2020 12:01 PM      Failed - This refill cannot be delegated      Failed - Urine Drug Screen completed in last 360 days.      Passed - Valid encounter within last 6 months    Recent Outpatient Visits           2 months ago GAD (generalized anxiety disorder)   [facility] at [facility], Ranell Patrick, MD   7 months ago Hoarseness   [facility] at [facility], Ranell Patrick, MD   8 months ago GAD (generalized anxiety disorder)   [facility] at [facility], Ranell Patrick, MD   1 year ago Hoarseness   [facility] at [facility], Ranell Patrick, MD   1 year ago Asthma, unspecified asthma severity, unspecified whether complicated, unspecified whether persistent   [facility] at [facility], Ranell Patrick, MD       Future Appointments             In 3 months [name], MD Primary Care at [place], [facility]              hydrocortisone (ANUSOL-HC) 2.5 % rectal cream [Pharmacy Med Name: HYDROCORTISONE 2.5% RECTAL CREAM] 30 g 0    Sig: APPLY RECTALLY TWICE DAILY      Off-Protocol Failed - 06/30/2020 12:01 PM      Failed - Medication not assigned to a protocol, review manually.      Passed - Valid encounter within last 12 months    Recent Outpatient Visits  2 months ago GAD (generalized anxiety disorder)   [facility] at [facility], Ranell Patrick, MD   7 months ago Hoarseness   [facility] at [facility], Ranell Patrick, MD   8 months ago GAD (generalized anxiety disorder)   [facility] at [facility], Ranell Patrick, MD   1 year ago Hoarseness   [facility] at [facility], Ranell Patrick, MD   1 year ago Asthma, unspecified asthma severity, unspecified whether complicated, unspecified whether persistent   [facility] at [facility], Ranell Patrick, MD       Future Appointments             In 3 months Carlota Raspberry Ranell Patrick, MD Primary Care at [place], [facility]           Over the Counter:  OTC Passed - 06/30/2020 12:01 PM      Passed - Valid encounter within last 12 months    Recent Outpatient Visits           2 months ago GAD (generalized anxiety disorder)   [facility] at [facility], Ranell Patrick, MD   7 months ago Hoarseness   [facility] at [facility], Ranell Patrick, MD   8 months ago GAD (generalized anxiety disorder)   [facility] at [place] [place], Ranell Patrick, MD   1 year ago Hoarseness   [facility] at [place],  Ranell Patrick, MD   1 year ago Asthma, unspecified asthma severity, unspecified whether complicated, unspecified whether persistent   [facility] at [facility], Ranell Patrick, MD       Future Appointments             In 3 months Carlota Raspberry Ranell Patrick, MD Primary Care at [place], [facility]

## 2020-07-06 DIAGNOSIS — M545 Low back pain: Secondary | ICD-10-CM | POA: Diagnosis not present

## 2020-07-06 DIAGNOSIS — R1032 Left lower quadrant pain: Secondary | ICD-10-CM | POA: Diagnosis not present

## 2020-07-06 DIAGNOSIS — M542 Cervicalgia: Secondary | ICD-10-CM | POA: Diagnosis not present

## 2020-07-06 DIAGNOSIS — M9901 Segmental and somatic dysfunction of cervical region: Secondary | ICD-10-CM | POA: Diagnosis not present

## 2020-07-06 DIAGNOSIS — M9902 Segmental and somatic dysfunction of thoracic region: Secondary | ICD-10-CM | POA: Diagnosis not present

## 2020-07-06 DIAGNOSIS — G8929 Other chronic pain: Secondary | ICD-10-CM | POA: Diagnosis not present

## 2020-07-06 DIAGNOSIS — K5900 Constipation, unspecified: Secondary | ICD-10-CM | POA: Diagnosis not present

## 2020-07-08 ENCOUNTER — Other Ambulatory Visit: Payer: Self-pay | Admitting: Family Medicine

## 2020-07-08 DIAGNOSIS — Z113 Encounter for screening for infections with a predominantly sexual mode of transmission: Secondary | ICD-10-CM

## 2020-07-08 DIAGNOSIS — Z79899 Other long term (current) drug therapy: Secondary | ICD-10-CM

## 2020-07-08 NOTE — Telephone Encounter (Signed)
Patient is requesting a refill of the following medications: Requested Prescriptions   Pending Prescriptions Disp Refills   emtricitabine-tenofovir (TRUVADA) 200-300 MG tablet [Pharmacy Med Name: EMTRICITABINE/TENOF DIS 200-300MG  T] 90 tablet 1    Sig: Take 1 tablet by mouth daily.    Date of patient request: 07/08/20 Last office visit: 04/15/20 Date of last refill: 01/18/20 Last refill amount: 90 + 1 Follow up time period per chart: 10/26/20

## 2020-07-08 NOTE — Telephone Encounter (Signed)
Requested medication (s) are due for refill today: no  Requested medication (s) are on the active medication list: yes   Last refill:  06/09/2020  Future visit scheduled: yes  Notes to clinic:  Medication not assigned to a protocol, review manually   Requested Prescriptions  Pending Prescriptions Disp Refills   emtricitabine-tenofovir (TRUVADA) 200-300 MG tablet [Pharmacy Med Name: EMTRICITABINE/TENOF DIS 200-300MG  T] 90 tablet 1    Sig: Take 1 tablet by mouth daily.      Off-Protocol Failed - 07/08/2020  9:17 AM      Failed - Medication not assigned to a protocol, review manually.      Passed - Valid encounter within last 12 months    Recent Outpatient Visits           2 months ago GAD (generalized anxiety disorder)   [facility], Ranell Patrick, MD   7 months ago Hoarseness   [facility] at [facility], Ranell Patrick, MD   8 months ago GAD (generalized anxiety disorder)   [facility] at [facility], Ranell Patrick, MD   1 year ago Hoarseness   [facility], Ranell Patrick, MD   1 year ago Asthma, unspecified asthma severity, unspecified whether complicated, unspecified whether persistent   [facility] at [facility], Ranell Patrick, MD       Future Appointments             In 3 months Carlota Raspberry Ranell Patrick, MD Primary Care at [place], [facility]

## 2020-07-08 NOTE — Telephone Encounter (Signed)
07/08/2020 - PATIENT IS REQUESTING A REFILL ON HIS EMTRICITABINE. HE HAS HIS ANNUAL PHYSICAL AND MEDICATION REVIEW SCHEDULED WITH DR. Carlota Raspberry ON Wednesday 10/26/2020 AT 8:00 am. I WILL SEND THIS MESSAGE BACK TO THE CLINICAL TEAM FOR REVIEW. Randall Reed

## 2020-07-13 DIAGNOSIS — J3089 Other allergic rhinitis: Secondary | ICD-10-CM | POA: Diagnosis not present

## 2020-07-13 DIAGNOSIS — J301 Allergic rhinitis due to pollen: Secondary | ICD-10-CM | POA: Diagnosis not present

## 2020-07-16 DIAGNOSIS — R05 Cough: Secondary | ICD-10-CM | POA: Diagnosis not present

## 2020-07-19 ENCOUNTER — Other Ambulatory Visit: Payer: Self-pay | Admitting: Family Medicine

## 2020-07-19 DIAGNOSIS — J45909 Unspecified asthma, uncomplicated: Secondary | ICD-10-CM

## 2020-07-19 NOTE — Telephone Encounter (Signed)
Requested Prescriptions  Pending Prescriptions Disp Refills   albuterol (VENTOLIN HFA) 108 (90 Base) MCG/ACT inhaler [Pharmacy Med Name: ALBUTEROL HFA INH (200 PUFFS)8.5GM] 8.5 g 0    Sig: INHALE 2 PUFFS INTO THE LUNGS EVERY 6 HOURS AS NEEDED FOR WHEEZING OR SHORTNESS OF BREATH     Pulmonology:  Beta Agonists Failed - 07/19/2020  1:11 PM      Failed - One inhaler should last at least one month. If the patient is requesting refills earlier, contact the patient to check for uncontrolled symptoms.      Passed - Valid encounter within last 12 months    Recent Outpatient Visits          3 months ago GAD (generalized anxiety disorder)   [facility] at [facility], Ranell Patrick, MD   8 months ago Hoarseness   [facility] at [facility], Ranell Patrick, MD   8 months ago GAD (generalized anxiety disorder)   [facility] at [facility], Ranell Patrick, MD   1 year ago Hoarseness   [facility] at [facility], Ranell Patrick, MD   1 year ago Asthma, unspecified asthma severity, unspecified whether complicated, unspecified whether persistent   [facility] at [facility], Ranell Patrick, MD      Future Appointments            In 3 months Carlota Raspberry Ranell Patrick, MD Primary Care at [place], [facility]

## 2020-07-25 DIAGNOSIS — G8929 Other chronic pain: Secondary | ICD-10-CM | POA: Diagnosis not present

## 2020-07-25 DIAGNOSIS — M9903 Segmental and somatic dysfunction of lumbar region: Secondary | ICD-10-CM | POA: Diagnosis not present

## 2020-07-25 DIAGNOSIS — M9905 Segmental and somatic dysfunction of pelvic region: Secondary | ICD-10-CM | POA: Diagnosis not present

## 2020-07-25 DIAGNOSIS — M9901 Segmental and somatic dysfunction of cervical region: Secondary | ICD-10-CM | POA: Diagnosis not present

## 2020-07-25 DIAGNOSIS — M9902 Segmental and somatic dysfunction of thoracic region: Secondary | ICD-10-CM | POA: Diagnosis not present

## 2020-07-25 DIAGNOSIS — M545 Low back pain: Secondary | ICD-10-CM | POA: Diagnosis not present

## 2020-07-25 DIAGNOSIS — M542 Cervicalgia: Secondary | ICD-10-CM | POA: Diagnosis not present

## 2020-07-29 ENCOUNTER — Encounter: Payer: Self-pay | Admitting: Family Medicine

## 2020-08-01 MED ORDER — POTASSIUM CHLORIDE ER 20 MEQ PO TBCR: 20.0000 meq | EXTENDED_RELEASE_TABLET | Freq: Every day | ORAL | 3 refills | Status: DC

## 2020-08-08 DIAGNOSIS — J3089 Other allergic rhinitis: Secondary | ICD-10-CM | POA: Diagnosis not present

## 2020-08-08 DIAGNOSIS — J301 Allergic rhinitis due to pollen: Secondary | ICD-10-CM | POA: Diagnosis not present

## 2020-08-09 DIAGNOSIS — M9901 Segmental and somatic dysfunction of cervical region: Secondary | ICD-10-CM | POA: Diagnosis not present

## 2020-08-09 DIAGNOSIS — M542 Cervicalgia: Secondary | ICD-10-CM | POA: Diagnosis not present

## 2020-08-09 DIAGNOSIS — M545 Low back pain, unspecified: Secondary | ICD-10-CM | POA: Diagnosis not present

## 2020-08-09 DIAGNOSIS — M9902 Segmental and somatic dysfunction of thoracic region: Secondary | ICD-10-CM | POA: Diagnosis not present

## 2020-08-12 ENCOUNTER — Other Ambulatory Visit: Payer: Self-pay | Admitting: Family Medicine

## 2020-08-12 DIAGNOSIS — F411 Generalized anxiety disorder: Secondary | ICD-10-CM

## 2020-08-12 NOTE — Telephone Encounter (Signed)
Patient is requesting a refill of the following medications: Requested Prescriptions   Pending Prescriptions Disp Refills   ALPRAZolam (XANAX) 1 MG tablet [Pharmacy Med Name: ALPRAZOLAM 1MG  TABLETS] 60 tablet     Sig: TAKE 1 TABLET(1 MG) BY MOUTH TWICE DAILY AS NEEDED    Date of patient request: 08/12/2020 Last office visit: 04/15/2020 Date of last refill: 06/30/2020 Last refill amount: 60

## 2020-08-12 NOTE — Telephone Encounter (Signed)
Requested medication (s) are due for refill today: yes  Requested medication (s) are on the active medication list: yes  Last refill:  06/30/20 #60 0 refills  Future visit scheduled: yes in 2 months   Notes to clinic:  not delegated per protocol     Requested Prescriptions  Pending Prescriptions Disp Refills   ALPRAZolam (XANAX) 1 MG tablet [Pharmacy Med Name: ALPRAZOLAM 1MG  TABLETS] 60 tablet     Sig: TAKE 1 TABLET(1 MG) BY MOUTH TWICE DAILY AS NEEDED      Not Delegated - Psychiatry:  Anxiolytics/Hypnotics Failed - 08/12/2020 10:53 AM      Failed - This refill cannot be delegated      Failed - Urine Drug Screen completed in last 360 days.      Passed - Valid encounter within last 6 months    Recent Outpatient Visits           3 months ago GAD (generalized anxiety disorder)   [facility] at [facility], Ranell Patrick, MD   8 months ago Hoarseness   [facility] at [facility], Ranell Patrick, MD   9 months ago GAD (generalized anxiety disorder)   [facility] at [facility], Ranell Patrick, MD   1 year ago Hoarseness   [facility] at [facility], Ranell Patrick, MD   1 year ago Asthma, unspecified asthma severity, unspecified whether complicated, unspecified whether persistent   [facility] at [facility], Ranell Patrick, MD       Future Appointments             In 2 months Carlota Raspberry Ranell Patrick, MD Primary Care at [place], [facility]

## 2020-08-12 NOTE — Telephone Encounter (Signed)
Controlled substance database (PDMP) reviewed. No concerns appreciated.  Alprazolam last filled for #60 on 07/10/2020.  Last office visit June 11, medication discussed at that time.  Refill granted.

## 2020-08-22 DIAGNOSIS — R498 Other voice and resonance disorders: Secondary | ICD-10-CM | POA: Diagnosis not present

## 2020-08-22 DIAGNOSIS — R490 Dysphonia: Secondary | ICD-10-CM | POA: Diagnosis not present

## 2020-08-22 DIAGNOSIS — K219 Gastro-esophageal reflux disease without esophagitis: Secondary | ICD-10-CM | POA: Diagnosis not present

## 2020-08-22 DIAGNOSIS — M542 Cervicalgia: Secondary | ICD-10-CM | POA: Diagnosis not present

## 2020-08-23 DIAGNOSIS — M542 Cervicalgia: Secondary | ICD-10-CM | POA: Diagnosis not present

## 2020-08-23 DIAGNOSIS — M9901 Segmental and somatic dysfunction of cervical region: Secondary | ICD-10-CM | POA: Diagnosis not present

## 2020-08-23 DIAGNOSIS — Z23 Encounter for immunization: Secondary | ICD-10-CM | POA: Diagnosis not present

## 2020-08-23 DIAGNOSIS — Z1159 Encounter for screening for other viral diseases: Secondary | ICD-10-CM | POA: Diagnosis not present

## 2020-08-23 DIAGNOSIS — M545 Low back pain, unspecified: Secondary | ICD-10-CM | POA: Diagnosis not present

## 2020-08-26 DIAGNOSIS — J301 Allergic rhinitis due to pollen: Secondary | ICD-10-CM | POA: Diagnosis not present

## 2020-08-26 DIAGNOSIS — J3089 Other allergic rhinitis: Secondary | ICD-10-CM | POA: Diagnosis not present

## 2020-09-07 DIAGNOSIS — M542 Cervicalgia: Secondary | ICD-10-CM | POA: Diagnosis not present

## 2020-09-07 DIAGNOSIS — M545 Low back pain, unspecified: Secondary | ICD-10-CM | POA: Diagnosis not present

## 2020-09-07 DIAGNOSIS — M9902 Segmental and somatic dysfunction of thoracic region: Secondary | ICD-10-CM | POA: Diagnosis not present

## 2020-09-07 DIAGNOSIS — M9901 Segmental and somatic dysfunction of cervical region: Secondary | ICD-10-CM | POA: Diagnosis not present

## 2020-09-14 DIAGNOSIS — Z23 Encounter for immunization: Secondary | ICD-10-CM | POA: Diagnosis not present

## 2020-09-14 DIAGNOSIS — J3089 Other allergic rhinitis: Secondary | ICD-10-CM | POA: Diagnosis not present

## 2020-09-14 DIAGNOSIS — J4540 Moderate persistent asthma, uncomplicated: Secondary | ICD-10-CM | POA: Diagnosis not present

## 2020-09-14 DIAGNOSIS — J301 Allergic rhinitis due to pollen: Secondary | ICD-10-CM | POA: Diagnosis not present

## 2020-09-16 DIAGNOSIS — K219 Gastro-esophageal reflux disease without esophagitis: Secondary | ICD-10-CM | POA: Diagnosis not present

## 2020-09-16 DIAGNOSIS — G4730 Sleep apnea, unspecified: Secondary | ICD-10-CM | POA: Diagnosis not present

## 2020-09-16 DIAGNOSIS — R490 Dysphonia: Secondary | ICD-10-CM | POA: Diagnosis not present

## 2020-09-16 DIAGNOSIS — J382 Nodules of vocal cords: Secondary | ICD-10-CM | POA: Diagnosis not present

## 2020-09-21 DIAGNOSIS — M9901 Segmental and somatic dysfunction of cervical region: Secondary | ICD-10-CM | POA: Diagnosis not present

## 2020-09-21 DIAGNOSIS — M9902 Segmental and somatic dysfunction of thoracic region: Secondary | ICD-10-CM | POA: Diagnosis not present

## 2020-09-21 DIAGNOSIS — M545 Low back pain, unspecified: Secondary | ICD-10-CM | POA: Diagnosis not present

## 2020-09-21 DIAGNOSIS — M542 Cervicalgia: Secondary | ICD-10-CM | POA: Diagnosis not present

## 2020-09-23 ENCOUNTER — Other Ambulatory Visit: Payer: Self-pay | Admitting: Family Medicine

## 2020-09-23 DIAGNOSIS — F411 Generalized anxiety disorder: Secondary | ICD-10-CM

## 2020-09-23 DIAGNOSIS — F5104 Psychophysiologic insomnia: Secondary | ICD-10-CM

## 2020-09-23 NOTE — Telephone Encounter (Signed)
Requested medication (s) are due for refill today: Yes  Requested medication (s) are on the active medication list: Yes  Last refill:  06/30/20  Future visit scheduled: Yes  Notes to clinic:  Unable to refill per protocol, cannot delegate     Requested Prescriptions  Pending Prescriptions Disp Refills   zolpidem (AMBIEN) 10 MG tablet [Pharmacy Med Name: ZOLPIDEM 10MG  TABLETS] 90 tablet     Sig: TAKE 1 TABLET(10 MG) BY MOUTH AT BEDTIME      Not Delegated - Psychiatry:  Anxiolytics/Hypnotics Failed - 09/23/2020  4:32 PM      Failed - This refill cannot be delegated      Failed - Urine Drug Screen completed in last 360 days      Passed - Valid encounter within last 6 months    Recent Outpatient Visits           5 months ago GAD (generalized anxiety disorder)   [facility] at [place], MD   10 months ago Hoarseness   [facility] at [facility], Ranell Patrick, MD   11 months ago GAD (generalized anxiety disorder)   [facility] at [facility], Ranell Patrick, MD   1 year ago Hoarseness   [facility] at [facility], Ranell Patrick, MD   1 year ago Asthma, unspecified asthma severity, unspecified whether complicated, unspecified whether persistent   [facility] at [facility], Ranell Patrick, MD       Future Appointments             In 1 month Carlota Raspberry Ranell Patrick, MD Primary Care at [place], [facility]

## 2020-09-23 NOTE — Telephone Encounter (Signed)
Requested medication (s) are due for refill today: yes  Requested medication (s) are on the active medication list: yes  Last refill:  08/12/20  #60  0 refills  Future visit scheduled: yes  Notes to clinic:  Not delegated    Requested Prescriptions  Pending Prescriptions Disp Refills   ALPRAZolam (XANAX) 1 MG tablet [Pharmacy Med Name: ALPRAZOLAM 1MG  TABLETS] 60 tablet     Sig: TAKE 1 TABLET(1 MG) BY MOUTH TWICE DAILY AS NEEDED      Not Delegated - Psychiatry:  Anxiolytics/Hypnotics Failed - 09/23/2020  3:59 PM      Failed - This refill cannot be delegated      Failed - Urine Drug Screen completed in last 360 days      Passed - Valid encounter within last 6 months    Recent Outpatient Visits           5 months ago GAD (generalized anxiety disorder)   [facility] at [facility], Ranell Patrick, MD   10 months ago Hoarseness   [facility] at [facility], Ranell Patrick, MD   11 months ago GAD (generalized anxiety disorder)   [facility] at [facility], Ranell Patrick, MD   1 year ago Hoarseness   [facility] at [facility], Ranell Patrick, MD   1 year ago Asthma, unspecified asthma severity, unspecified whether complicated, unspecified whether persistent   [facility] at [facility], Ranell Patrick, MD       Future Appointments             In 1 month Carlota Raspberry Ranell Patrick, MD Primary Care at [place], [facility]

## 2020-09-23 NOTE — Telephone Encounter (Signed)
Discussed in June.  Controlled substance database (PDMP) reviewed. No concerns appreciated.  Last filled #60 on 08/12/20. Refill ordered.

## 2020-09-27 NOTE — Telephone Encounter (Signed)
Controlled substance database (PDMP) reviewed. No concerns appreciated.  ambien refilled.

## 2020-10-03 DIAGNOSIS — R194 Change in bowel habit: Secondary | ICD-10-CM | POA: Insufficient documentation

## 2020-10-03 DIAGNOSIS — Z1211 Encounter for screening for malignant neoplasm of colon: Secondary | ICD-10-CM | POA: Insufficient documentation

## 2020-10-03 DIAGNOSIS — R197 Diarrhea, unspecified: Secondary | ICD-10-CM | POA: Insufficient documentation

## 2020-10-03 DIAGNOSIS — Z860100 Personal history of colon polyps, unspecified: Secondary | ICD-10-CM | POA: Insufficient documentation

## 2020-10-03 DIAGNOSIS — E230 Hypopituitarism: Secondary | ICD-10-CM | POA: Insufficient documentation

## 2020-10-03 DIAGNOSIS — R6881 Early satiety: Secondary | ICD-10-CM | POA: Insufficient documentation

## 2020-10-05 DIAGNOSIS — Z03818 Encounter for observation for suspected exposure to other biological agents ruled out: Secondary | ICD-10-CM | POA: Diagnosis not present

## 2020-10-05 DIAGNOSIS — Z20822 Contact with and (suspected) exposure to covid-19: Secondary | ICD-10-CM | POA: Diagnosis not present

## 2020-10-07 DIAGNOSIS — J3089 Other allergic rhinitis: Secondary | ICD-10-CM | POA: Diagnosis not present

## 2020-10-07 DIAGNOSIS — J301 Allergic rhinitis due to pollen: Secondary | ICD-10-CM | POA: Diagnosis not present

## 2020-10-09 ENCOUNTER — Encounter: Payer: Self-pay | Admitting: Family Medicine

## 2020-10-10 DIAGNOSIS — J45901 Unspecified asthma with (acute) exacerbation: Secondary | ICD-10-CM | POA: Diagnosis not present

## 2020-10-10 DIAGNOSIS — J0100 Acute maxillary sinusitis, unspecified: Secondary | ICD-10-CM | POA: Diagnosis not present

## 2020-10-10 DIAGNOSIS — Z03818 Encounter for observation for suspected exposure to other biological agents ruled out: Secondary | ICD-10-CM | POA: Diagnosis not present

## 2020-10-10 DIAGNOSIS — R0602 Shortness of breath: Secondary | ICD-10-CM | POA: Diagnosis not present

## 2020-10-12 ENCOUNTER — Other Ambulatory Visit: Payer: Self-pay

## 2020-10-12 DIAGNOSIS — Z113 Encounter for screening for infections with a predominantly sexual mode of transmission: Secondary | ICD-10-CM

## 2020-10-12 DIAGNOSIS — R739 Hyperglycemia, unspecified: Secondary | ICD-10-CM

## 2020-10-12 DIAGNOSIS — I10 Essential (primary) hypertension: Secondary | ICD-10-CM

## 2020-10-12 DIAGNOSIS — Z125 Encounter for screening for malignant neoplasm of prostate: Secondary | ICD-10-CM

## 2020-10-12 DIAGNOSIS — Z79899 Other long term (current) drug therapy: Secondary | ICD-10-CM

## 2020-10-12 DIAGNOSIS — Z833 Family history of diabetes mellitus: Secondary | ICD-10-CM

## 2020-10-12 DIAGNOSIS — Z13220 Encounter for screening for lipoid disorders: Secondary | ICD-10-CM

## 2020-10-14 ENCOUNTER — Other Ambulatory Visit (HOSPITAL_COMMUNITY)
Admission: RE | Admit: 2020-10-14 | Discharge: 2020-10-14 | Disposition: A | Discharge status: Home / Self Care | Payer: BC Managed Care – PPO | Source: Ambulatory Visit | Attending: Family Medicine | Admitting: Family Medicine

## 2020-10-14 ENCOUNTER — Other Ambulatory Visit: Payer: Self-pay

## 2020-10-14 ENCOUNTER — Ambulatory Visit: Payer: BC Managed Care – PPO

## 2020-10-14 DIAGNOSIS — Z833 Family history of diabetes mellitus: Secondary | ICD-10-CM

## 2020-10-14 DIAGNOSIS — E669 Obesity, unspecified: Secondary | ICD-10-CM | POA: Diagnosis not present

## 2020-10-14 DIAGNOSIS — Z113 Encounter for screening for infections with a predominantly sexual mode of transmission: Secondary | ICD-10-CM

## 2020-10-14 DIAGNOSIS — Z79899 Other long term (current) drug therapy: Secondary | ICD-10-CM

## 2020-10-14 DIAGNOSIS — R739 Hyperglycemia, unspecified: Secondary | ICD-10-CM

## 2020-10-14 DIAGNOSIS — E230 Hypopituitarism: Secondary | ICD-10-CM | POA: Diagnosis not present

## 2020-10-14 DIAGNOSIS — Z125 Encounter for screening for malignant neoplasm of prostate: Secondary | ICD-10-CM | POA: Diagnosis not present

## 2020-10-14 DIAGNOSIS — I10 Essential (primary) hypertension: Secondary | ICD-10-CM | POA: Diagnosis not present

## 2020-10-14 DIAGNOSIS — D751 Secondary polycythemia: Secondary | ICD-10-CM | POA: Diagnosis not present

## 2020-10-14 LAB — LIPID PANEL

## 2020-10-15 LAB — CMP14+EGFR
ALT: 19 IU/L (ref 0–44)
AST: 20 IU/L (ref 0–40)
Albumin/Globulin Ratio: 1.8 (ref 1.2–2.2)
Albumin: 4.6 g/dL (ref 3.8–4.9)
Alkaline Phosphatase: 82 IU/L (ref 44–121)
BUN/Creatinine Ratio: 12 (ref 9–20)
BUN: 15 mg/dL (ref 6–24)
Bilirubin Total: 0.5 mg/dL (ref 0.0–1.2)
CO2: 24 mmol/L (ref 20–29)
Calcium: 9.6 mg/dL (ref 8.7–10.2)
Chloride: 99 mmol/L (ref 96–106)
Creatinine, Ser: 1.28 mg/dL — ABNORMAL HIGH (ref 0.76–1.27)
GFR calc Af Amer: 74 mL/min/{1.73_m2} (ref >59)
GFR calc non Af Amer: 64 mL/min/{1.73_m2} (ref >59)
Globulin, Total: 2.5 g/dL (ref 1.5–4.5)
Glucose: 90 mg/dL (ref 65–99)
Potassium: 4.2 mmol/L (ref 3.5–5.2)
Sodium: 137 mmol/L (ref 134–144)
Total Protein: 7.1 g/dL (ref 6.0–8.5)

## 2020-10-15 LAB — HIV ANTIBODY (ROUTINE TESTING W REFLEX): HIV Screen 4th Generation wRfx: NONREACTIVE

## 2020-10-15 LAB — LIPID PANEL
Chol/HDL Ratio: 2.7 ratio (ref 0.0–5.0)
Cholesterol, Total: 98 mg/dL — ABNORMAL LOW (ref 100–199)
HDL: 36 mg/dL — ABNORMAL LOW (ref >39)
LDL Chol Calc (NIH): 47 mg/dL (ref 0–99)
Triglycerides: 69 mg/dL (ref 0–149)
VLDL Cholesterol Cal: 15 mg/dL (ref 5–40)

## 2020-10-15 LAB — HEMOGLOBIN A1C
Est. average glucose Bld gHb Est-mCnc: 97 mg/dL
Hgb A1c MFr Bld: 5 % (ref 4.8–5.6)

## 2020-10-15 LAB — RPR: RPR Ser Ql: NONREACTIVE

## 2020-10-15 LAB — PSA: Prostate Specific Ag, Serum: 0.5 ng/mL (ref 0.0–4.0)

## 2020-10-15 LAB — TSH: TSH: 1.33 u[IU]/mL (ref 0.450–4.500)

## 2020-10-17 LAB — URINE CYTOLOGY ANCILLARY ONLY
Chlamydia: NEGATIVE
Comment: NEGATIVE
Comment: NEGATIVE
Comment: NORMAL
Neisseria Gonorrhea: NEGATIVE
Trichomonas: NEGATIVE

## 2020-10-18 DIAGNOSIS — J0101 Acute recurrent maxillary sinusitis: Secondary | ICD-10-CM | POA: Diagnosis not present

## 2020-10-21 MED ORDER — LOSARTAN POTASSIUM 100 MG PO TABS: 50.0000 mg | ORAL_TABLET | Freq: Every day | ORAL | 3 refills | Status: DC

## 2020-10-24 DIAGNOSIS — J301 Allergic rhinitis due to pollen: Secondary | ICD-10-CM | POA: Diagnosis not present

## 2020-10-24 DIAGNOSIS — J3089 Other allergic rhinitis: Secondary | ICD-10-CM | POA: Diagnosis not present

## 2020-10-25 DIAGNOSIS — R059 Cough, unspecified: Secondary | ICD-10-CM | POA: Diagnosis not present

## 2020-10-25 DIAGNOSIS — Z20822 Contact with and (suspected) exposure to covid-19: Secondary | ICD-10-CM | POA: Diagnosis not present

## 2020-10-25 DIAGNOSIS — J029 Acute pharyngitis, unspecified: Secondary | ICD-10-CM | POA: Diagnosis not present

## 2020-10-25 DIAGNOSIS — R5383 Other fatigue: Secondary | ICD-10-CM | POA: Diagnosis not present

## 2020-10-26 ENCOUNTER — Encounter: Payer: BC Managed Care – PPO | Admitting: Family Medicine

## 2020-10-26 ENCOUNTER — Encounter: Payer: Self-pay | Admitting: Family Medicine

## 2020-11-01 ENCOUNTER — Other Ambulatory Visit: Payer: Self-pay | Admitting: Family Medicine

## 2020-11-01 DIAGNOSIS — F411 Generalized anxiety disorder: Secondary | ICD-10-CM

## 2020-11-01 DIAGNOSIS — G43109 Migraine with aura, not intractable, without status migrainosus: Secondary | ICD-10-CM

## 2020-11-01 NOTE — Telephone Encounter (Signed)
Requested medication (s) are due for refill today:  Yes  Requested medication (s) are on the active medication list:  Yes  Future visit scheduled:  Yes  Last Refill: 09/23/20; #60; refills 0  Notes to clinic: medication is not delegated  Requested Prescriptions  Pending Prescriptions Disp Refills   ALPRAZolam (XANAX) 1 MG tablet [Pharmacy Med Name: ALPRAZOLAM 1MG  TABLETS] 60 tablet     Sig: TAKE 1 TABLET(1 MG) BY MOUTH TWICE DAILY AS NEEDED      Not Delegated - Psychiatry:  Anxiolytics/Hypnotics Failed - 11/01/2020  3:55 PM      Failed - This refill cannot be delegated      Failed - Urine Drug Screen completed in last 360 days      Failed - Valid encounter within last 6 months    Recent Outpatient Visits           6 months ago GAD (generalized anxiety disorder)   [facility] at [facility], Sunday Shams, MD   11 months ago Hoarseness   [facility], Sunday Shams, MD   1 year ago GAD (generalized anxiety disorder)   [facility] at [facility], Sunday Shams, MD   1 year ago Hoarseness   [facility] at [facility], Sunday Shams, MD   1 year ago Asthma, unspecified asthma severity, unspecified whether complicated, unspecified whether persistent   [facility] at [facility], Sunday Shams, MD       Future Appointments             In 1 month [facility] Neva Seat, MD Primary Care at [place], [facility]   In 2 months [facility], MD [facility], [facility]              Signed Prescriptions Disp Refills   rizatriptan (MAXALT-MLT) 10 MG disintegrating tablet 9 tablet 0    Sig: TAKE 1 TABLET BY MOUTH AS NEEDED FOR HEADACHE, MAY REPEAT IN 2 HOURS IF NEEDED      Neurology:  Migraine Therapy - Triptan Passed - 11/01/2020  3:55 PM      Passed - Last BP in normal range    BP Readings from Last 1 Encounters:  04/22/20 126/84          Passed - Valid encounter within last 12 months    Recent Outpatient Visits           6 months ago GAD  (generalized anxiety disorder)   Primary Care at 04/24/20, Sunday Shams, MD   11 months ago Hoarseness   [facility] at [facility], Sunday Shams, MD   1 year ago GAD (generalized anxiety disorder)   [facility] at [facility], Sunday Shams, MD   1 year ago Hoarseness   [facility] at [facility], Sunday Shams, MD   1 year ago Asthma, unspecified asthma severity, unspecified whether complicated, unspecified whether persistent   Primary [facility] at [facility], Sunday Shams, MD       Future Appointments             In 1 month [facility] Neva Se[facility], MD Primary Care at [place], [facility]   In 2 months [facility], MD [facility], [facility]

## 2020-11-01 NOTE — Telephone Encounter (Signed)
Controlled substance database (PDMP) reviewed. No concerns appreciated. Refill ordered.   

## 2020-11-01 NOTE — Telephone Encounter (Signed)
Requested Prescriptions  Pending Prescriptions Disp Refills  . ALPRAZolam (XANAX) 1 MG tablet [Pharmacy Med Name: ALPRAZOLAM 1MG  TABLETS] 60 tablet     Sig: TAKE 1 TABLET(1 MG) BY MOUTH TWICE DAILY AS NEEDED     Not Delegated - Psychiatry:  Anxiolytics/Hypnotics Failed - 11/01/2020  3:55 PM      Failed - This refill cannot be delegated      Failed - Urine Drug Screen completed in last 360 days      Failed - Valid encounter within last 6 months    Recent Outpatient Visits          6 months ago GAD (generalized anxiety disorder)   Primary Care at [facility], Sunday Shams, MD   11 months ago Hoarseness   [facility], Sunday Shams, MD   1 year ago GAD (generalized anxiety disorder)   [facility] at [facility], Sunday Shams, MD   1 year ago Hoarseness   [facility] at [facility], Sunday Shams, MD   1 year ago Asthma, unspecified asthma severity, unspecified whether complicated, unspecified whether persistent   [facility] at [facility], Sunday Shams, MD      Future Appointments            In 1 month [facility] Neva Seat, MD Primary Care at [place], [facility]   In 2 months [facility], MD [facility], [facility]           . rizatriptan (MAXALT-MLT) 10 MG disintegrating tablet [Pharmacy Med Name: RIZATRIPTAN ODT 10MG  TABLETS] 9 tablet 0    Sig: TAKE 1 TABLET BY MOUTH AS NEEDED FOR HEADACHE, MAY REPEAT IN 2 HOURS IF NEEDED     Neurology:  Migraine Therapy - Triptan Passed - 11/01/2020  3:55 PM      Passed - Last BP in normal range    BP Readings from Last 1 Encounters:  04/22/20 126/84         Passed - Valid encounter within last 12 months    Recent Outpatient Visits          6 months ago GAD (generalized anxiety disorder)   Primary Care at 11/03/2020, 04/24/20, MD   11 months ago Hoarseness   [facility] at [facility], [facility], MD   1 year ago GAD (generalized anxiety disorder)   [facility] at [facility], [facility], MD    1 year ago Hoarseness   [facility] at [facility], [facility], MD   1 year ago Asthma, unspecified asthma severity, unspecified whether complicated, unspecified whether persistent   [facility] at [facility], [facility], MD      Future Appointments            In 1 month Sunday [facility], MD Primary Care at [place], [facility]   In 2 months [place], MD [facility], [facility]

## 2020-11-01 NOTE — Telephone Encounter (Signed)
Patient is requesting a refill of the following medications: Requested Prescriptions   Pending Prescriptions Disp Refills   ALPRAZolam (XANAX) 1 MG tablet [Pharmacy Med Name: ALPRAZOLAM 1MG  TABLETS] 60 tablet     Sig: TAKE 1 TABLET(1 MG) BY MOUTH TWICE DAILY AS NEEDED   Signed Prescriptions Disp Refills   rizatriptan (MAXALT-MLT) 10 MG disintegrating tablet 9 tablet 0    Sig: TAKE 1 TABLET BY MOUTH AS NEEDED FOR HEADACHE, MAY REPEAT IN 2 HOURS IF NEEDED    Authorizing Provider:    Ordering User: Shade Flood    Date of patient request: 11/01/2020 Last office visit: 04/15/2020 Date of last refill: 09/23/2020 Last refill amount: 60 Follow up time period per chart: 6 months

## 2020-11-02 ENCOUNTER — Encounter: Payer: BC Managed Care – PPO | Admitting: Family Medicine

## 2020-11-02 DIAGNOSIS — M545 Low back pain, unspecified: Secondary | ICD-10-CM | POA: Diagnosis not present

## 2020-11-02 DIAGNOSIS — M9901 Segmental and somatic dysfunction of cervical region: Secondary | ICD-10-CM | POA: Diagnosis not present

## 2020-11-02 DIAGNOSIS — M9902 Segmental and somatic dysfunction of thoracic region: Secondary | ICD-10-CM | POA: Diagnosis not present

## 2020-11-02 DIAGNOSIS — M542 Cervicalgia: Secondary | ICD-10-CM | POA: Diagnosis not present

## 2020-11-10 DIAGNOSIS — J3089 Other allergic rhinitis: Secondary | ICD-10-CM | POA: Diagnosis not present

## 2020-11-10 DIAGNOSIS — M542 Cervicalgia: Secondary | ICD-10-CM | POA: Diagnosis not present

## 2020-11-10 DIAGNOSIS — J301 Allergic rhinitis due to pollen: Secondary | ICD-10-CM | POA: Diagnosis not present

## 2020-11-10 DIAGNOSIS — R490 Dysphonia: Secondary | ICD-10-CM | POA: Diagnosis not present

## 2020-11-10 DIAGNOSIS — R498 Other voice and resonance disorders: Secondary | ICD-10-CM | POA: Diagnosis not present

## 2020-11-10 DIAGNOSIS — K219 Gastro-esophageal reflux disease without esophagitis: Secondary | ICD-10-CM | POA: Diagnosis not present

## 2020-11-11 DIAGNOSIS — K644 Residual hemorrhoidal skin tags: Secondary | ICD-10-CM | POA: Diagnosis not present

## 2020-11-14 DIAGNOSIS — J301 Allergic rhinitis due to pollen: Secondary | ICD-10-CM | POA: Diagnosis not present

## 2020-11-14 DIAGNOSIS — J3089 Other allergic rhinitis: Secondary | ICD-10-CM | POA: Diagnosis not present

## 2020-11-21 DIAGNOSIS — Z03818 Encounter for observation for suspected exposure to other biological agents ruled out: Secondary | ICD-10-CM | POA: Diagnosis not present

## 2020-11-21 DIAGNOSIS — Z20822 Contact with and (suspected) exposure to covid-19: Secondary | ICD-10-CM | POA: Diagnosis not present

## 2020-11-21 DIAGNOSIS — J029 Acute pharyngitis, unspecified: Secondary | ICD-10-CM | POA: Diagnosis not present

## 2020-11-23 DIAGNOSIS — M9902 Segmental and somatic dysfunction of thoracic region: Secondary | ICD-10-CM | POA: Diagnosis not present

## 2020-11-23 DIAGNOSIS — M545 Low back pain, unspecified: Secondary | ICD-10-CM | POA: Diagnosis not present

## 2020-11-23 DIAGNOSIS — M9901 Segmental and somatic dysfunction of cervical region: Secondary | ICD-10-CM | POA: Diagnosis not present

## 2020-11-23 DIAGNOSIS — M542 Cervicalgia: Secondary | ICD-10-CM | POA: Diagnosis not present

## 2020-11-24 DIAGNOSIS — J301 Allergic rhinitis due to pollen: Secondary | ICD-10-CM | POA: Diagnosis not present

## 2020-11-24 DIAGNOSIS — J3089 Other allergic rhinitis: Secondary | ICD-10-CM | POA: Diagnosis not present

## 2020-11-25 DIAGNOSIS — E230 Hypopituitarism: Secondary | ICD-10-CM | POA: Diagnosis not present

## 2020-11-25 DIAGNOSIS — R109 Unspecified abdominal pain: Secondary | ICD-10-CM | POA: Diagnosis not present

## 2020-11-25 DIAGNOSIS — K219 Gastro-esophageal reflux disease without esophagitis: Secondary | ICD-10-CM | POA: Diagnosis not present

## 2020-11-25 DIAGNOSIS — Z86010 Personal history of colonic polyps: Secondary | ICD-10-CM | POA: Diagnosis not present

## 2020-11-25 DIAGNOSIS — R197 Diarrhea, unspecified: Secondary | ICD-10-CM | POA: Diagnosis not present

## 2020-12-04 ENCOUNTER — Other Ambulatory Visit: Payer: Self-pay | Admitting: Family Medicine

## 2020-12-04 DIAGNOSIS — G43109 Migraine with aura, not intractable, without status migrainosus: Secondary | ICD-10-CM

## 2020-12-04 DIAGNOSIS — J45909 Unspecified asthma, uncomplicated: Secondary | ICD-10-CM

## 2020-12-04 DIAGNOSIS — F411 Generalized anxiety disorder: Secondary | ICD-10-CM

## 2020-12-04 DIAGNOSIS — J309 Allergic rhinitis, unspecified: Secondary | ICD-10-CM

## 2020-12-04 NOTE — Telephone Encounter (Signed)
Requested medication (s) are due for refill today: yes  Requested medication (s) are on the active medication list: yes  Last refill:  11/02/2020  Future visit scheduled: yes  Notes to clinic: this refill cannot be delegated    Requested Prescriptions  Pending Prescriptions Disp Refills   ALPRAZolam (XANAX) 1 MG tablet [Pharmacy Med Name: ALPRAZOLAM 1MG  TABLETS] 60 tablet     Sig: TAKE 1 TABLET(1 MG) BY MOUTH TWICE DAILY AS NEEDED      Not Delegated - Psychiatry:  Anxiolytics/Hypnotics Failed - 12/04/2020 10:22 PM      Failed - This refill cannot be delegated      Failed - Urine Drug Screen completed in last 360 days      Failed - Valid encounter within last 6 months    Recent Outpatient Visits           7 months ago GAD (generalized anxiety disorder)   [facility] at [facility], Ranell Patrick, MD   1 year ago Hoarseness   [facility] at [facility], Ranell Patrick, MD   1 year ago GAD (generalized anxiety disorder)   [facility] at [facility], Ranell Patrick, MD   1 year ago Hoarseness   [facility] at [facility], Ranell Patrick, MD   1 year ago Asthma, unspecified asthma severity, unspecified whether complicated, unspecified whether persistent   Primary Care at [facility], Ranell Patrick, MD       Future Appointments             In 4 days [name], MD Primary Care at [place], [facility]   In 4 weeks Sherren Mocha, MD [place], [facility]              Signed Prescriptions Disp Refills   montelukast (SINGULAIR) 10 MG tablet 90 tablet 1    Sig: TAKE 1 TABLET(10 MG) BY MOUTH AT BEDTIME      Pulmonology:  Leukotriene Inhibitors Passed - 12/04/2020 10:22 PM      Passed - Valid encounter within last 12 months    Recent Outpatient Visits           7 months ago GAD (generalized anxiety disorder)   [facility] at [facility], Ranell Patrick, MD   1 year ago Hoarseness   [facility] at [facility], Ranell Patrick, MD   1 year ago GAD  (generalized anxiety disorder)   [facility] Care at [facility], Ranell Patrick, MD   1 year ago Hoarseness   [facility] at [facility], Ranell Patrick, MD   1 year ago Asthma, unspecified asthma severity, unspecified whether complicated, unspecified whether persistent   Primary Care at [facility], Ranell Patrick, MD       Future Appointments             In 4 days [name], MD Primary Care at [place], [facility]   In 4 weeks Sherren Mocha, MD [place], [facility]               rizatriptan (MAXALT-MLT) 10 MG disintegrating tablet 9 tablet 0    Sig: TAKE 1 TABLET BY MOUTH AS NEEDED FOR HEADACHE. MAY REPEAT IN 2 HOURS IF NEEDED      Neurology:  Migraine Therapy - Triptan Passed - 12/04/2020 10:22 PM      Passed - Last BP in normal range    BP Readings from Last 1 Encounters:  04/22/20 126/84  Passed - Valid encounter within last 12 months    Recent Outpatient Visits           7 months ago GAD (generalized anxiety disorder)   [facility] at [facility], Ranell Patrick, MD   1 year ago Hoarseness   [facility] at [facility], Ranell Patrick, MD   1 year ago GAD (generalized anxiety disorder)   [facility] at [facility], Ranell Patrick, MD   1 year ago Hoarseness   [facility] at [facility], Ranell Patrick, MD   1 year ago Asthma, unspecified asthma severity, unspecified whether complicated, unspecified whether persistent   Primary Care at [facility], Ranell Patrick, MD       Future Appointments             In 4 days [name], MD Primary Care at [place], [facility]   In 4 weeks Sherren Mocha, MD [facility], [facility]

## 2020-12-05 NOTE — Telephone Encounter (Signed)
Please advise   Patient is requesting a refill of the following medications: Requested Prescriptions   Pending Prescriptions Disp Refills  . ALPRAZolam (XANAX) 1 MG tablet [Pharmacy Med Name: ALPRAZOLAM 1MG  TABLETS] 60 tablet     Sig: TAKE 1 TABLET(1 MG) BY MOUTH TWICE DAILY AS NEEDED   Signed Prescriptions Disp Refills  . montelukast (SINGULAIR) 10 MG tablet 90 tablet 1    Sig: TAKE 1 TABLET(10 MG) BY MOUTH AT BEDTIME    Authorizing Provider: [name]    Ordering User: HENDRICKS, CAYAREL L  . rizatriptan (MAXALT-MLT) 10 MG disintegrating tablet 9 tablet 0    Sig: TAKE 1 TABLET BY MOUTH AS NEEDED FOR HEADACHE. MAY REPEAT IN 2 HOURS IF NEEDED    Authorizing Provider: [name] R    Ordering User: HENDRICKS, CAYAREL L    Date of patient request:12/04/20  Last office visit: 10/14/20 Date of last refill: 10/14/20 Last refill amount: 60 Follow up time period per chart: 12/08/20

## 2020-12-06 DIAGNOSIS — E669 Obesity, unspecified: Secondary | ICD-10-CM | POA: Diagnosis not present

## 2020-12-06 DIAGNOSIS — D751 Secondary polycythemia: Secondary | ICD-10-CM | POA: Diagnosis not present

## 2020-12-06 DIAGNOSIS — E230 Hypopituitarism: Secondary | ICD-10-CM | POA: Diagnosis not present

## 2020-12-06 NOTE — Telephone Encounter (Signed)
Last filled alprazolam 11/02/2020.  Last discussed April 15, 2020.  Refill ordered -  has appointment scheduled in 2 days.

## 2020-12-07 DIAGNOSIS — J3089 Other allergic rhinitis: Secondary | ICD-10-CM | POA: Diagnosis not present

## 2020-12-07 DIAGNOSIS — J301 Allergic rhinitis due to pollen: Secondary | ICD-10-CM | POA: Diagnosis not present

## 2020-12-08 ENCOUNTER — Ambulatory Visit (INDEPENDENT_AMBULATORY_CARE_PROVIDER_SITE_OTHER): Payer: BC Managed Care – PPO | Admitting: Family Medicine

## 2020-12-08 ENCOUNTER — Encounter: Payer: Self-pay | Admitting: Family Medicine

## 2020-12-08 ENCOUNTER — Other Ambulatory Visit: Payer: Self-pay

## 2020-12-08 VITALS — BP 108/66 | HR 80 | Temp 98.2°F | Ht 71.0 in | Wt 231.0 lb

## 2020-12-08 DIAGNOSIS — I10 Essential (primary) hypertension: Secondary | ICD-10-CM

## 2020-12-08 DIAGNOSIS — Z113 Encounter for screening for infections with a predominantly sexual mode of transmission: Secondary | ICD-10-CM

## 2020-12-08 DIAGNOSIS — G43109 Migraine with aura, not intractable, without status migrainosus: Secondary | ICD-10-CM

## 2020-12-08 DIAGNOSIS — Z0001 Encounter for general adult medical examination with abnormal findings: Secondary | ICD-10-CM

## 2020-12-08 DIAGNOSIS — J45909 Unspecified asthma, uncomplicated: Secondary | ICD-10-CM

## 2020-12-08 DIAGNOSIS — F5104 Psychophysiologic insomnia: Secondary | ICD-10-CM

## 2020-12-08 DIAGNOSIS — K580 Irritable bowel syndrome with diarrhea: Secondary | ICD-10-CM

## 2020-12-08 DIAGNOSIS — J309 Allergic rhinitis, unspecified: Secondary | ICD-10-CM

## 2020-12-08 DIAGNOSIS — Z0000 Encounter for general adult medical examination without abnormal findings: Secondary | ICD-10-CM

## 2020-12-08 DIAGNOSIS — F411 Generalized anxiety disorder: Secondary | ICD-10-CM

## 2020-12-08 DIAGNOSIS — Z79899 Other long term (current) drug therapy: Secondary | ICD-10-CM

## 2020-12-08 MED ORDER — EMTRICITABINE-TENOFOVIR DF 200-300 MG PO TABS: 1.0000 | ORAL_TABLET | Freq: Every day | ORAL | 1 refills | Status: DC

## 2020-12-08 MED ORDER — LOSARTAN POTASSIUM 25 MG PO TABS: 25.0000 mg | ORAL_TABLET | Freq: Every day | ORAL | 1 refills | Status: DC

## 2020-12-08 NOTE — Patient Instructions (Addendum)
Try decreasing losartan to 25mg . Keep follow up with cardiology. If sluggish feeling is persisting, return to discuss further.  Great work on the [employer]  loss!   B12 at next blood draw or on follow up with me if needed.  I would consider meeting with headache specialist as other meds may lessen need for maxalt.  Let me know if you need a referral.  If any worsening of your abdominal symptoms I would recommend being seen right away with your history of diverticulosis/diverticulitis.  If you have continued off-and-on symptoms it may be worthwhile discussing again with gastroenterology or I can certainly refer you elsewhere if needed.   Return to the clinic or go to the nearest emergency room if any of your symptoms worsen or new symptoms occur.   Keeping you healthy  Get these tests  Blood pressure- Have your blood pressure checked once a year by your healthcare provider.  Normal blood pressure is 120/80  Weight- Have your body mass index (BMI) calculated to screen for obesity.  BMI is a measure of body fat based on height and weight. You can also calculate your own BMI at [email or web address].  Cholesterol- Have your cholesterol checked every year.  Diabetes- Have your blood sugar checked regularly if you have high blood pressure, high cholesterol, have a family history of diabetes or if you are overweight.  Screening for Colon Cancer- Colonoscopy starting at age 66.  Screening may begin sooner depending on your family history and other health conditions. Follow up colonoscopy as directed by your Gastroenterologist.  Screening for Prostate Cancer- Both blood work (PSA) and a rectal exam help screen for Prostate Cancer.  Screening begins at age 85 with African-American men and at age 31 with Caucasian men.  Screening may begin sooner depending on your family history.  Take these medicines  Aspirin- One aspirin daily can help prevent Heart disease and Stroke.  Flu shot- Every  fall.  Tetanus- Every 10 years  Pneumonia shot- Once after the age of 65; if you are younger than 5, ask your healthcare provider if you need a Pneumonia shot.  Take these steps  Don't smoke- If you do smoke, talk to your doctor about quitting.  For tips on how to quit, go to www.smokefree.gov or call 1-800-QUIT-NOW.  Be physically active- Exercise 5 days a week for at least 30 minutes.  If you are not already physically active start slow and gradually work up to 30 minutes of moderate physical activity.  Examples of moderate activity include walking briskly, mowing the yard, dancing, swimming, bicycling, etc.  Eat a healthy diet- Eat a variety of healthy food such as fruits, vegetables, low fat milk, low fat cheese, yogurt, lean meant, poultry, fish, beans, tofu, etc. For more information go to www.thenutritionsource.org  Drink alcohol in moderation- Limit alcohol intake to less than two drinks a day. Never drink and drive.  Dentist- Brush and floss twice daily; visit your dentist twice a year.  Depression- Your emotional health is as important as your physical health. If you're feeling down, or losing interest in things you would normally enjoy please talk to your healthcare provider.  Eye exam- Visit your eye doctor every year.  Safe sex- If you may be exposed to a sexually transmitted infection, use a condom.  Seat belts- Seat belts can save your life; always wear one.  Smoke/Carbon Monoxide detectors- These detectors need to be installed on the appropriate level of your home.  Replace batteries at least once  a year.  Skin cancer- When out in the sun, cover up and use sunscreen 15 SPF or higher.  Violence- If anyone is threatening you, please tell your healthcare provider.  Living Will/ Health care power of attorney- Speak with your healthcare provider and family.   If you have lab work done today you will be contacted with your lab results within the next 2 weeks.  If you  have not heard from [place] then please contact us. The fastest way to get your results is to register for My Chart.   IF you received an x-ray today, you will receive an invoice from [facility]. Please contact [facility] at [phone] with questions or concerns regarding your invoice.   IF you received labwork today, you will receive an invoice from [facility]. Please contact [facility] at [phone] with questions or concerns regarding your invoice.   Our billing staff will not be able to assist you with questions regarding bills from these companies.  You will be contacted with the lab results as soon as they are available. The fastest way to get your results is to activate your My Chart account. Instructions are located on the last page of this paperwork. If you have not heard from [place] regarding the results in 2 weeks, please contact this office.

## 2020-12-08 NOTE — Progress Notes (Signed)
Subjective:  Patient ID: Randall Leberarl D Kearley Jr., male    DOB: 10/04/1971  Age: 50 y.o. MRN: [id number]  CC:  Chief Complaint  Patient presents with  . Annual Exam    Pt reports he is feeling fine with no complaints. PT reports his Truvada has to go to the [facility]. Pt states all other refills can go to his usual pharmacy.    HPI Randall LeberCarl D Newbern Jr. presents for   Annual physical exam  Care team Endocrinology Dr. Sharl MaKerr Followed by Dr.Judkins in [place] for MSK issues, adjustments. Gastroenterology, Dr. Dulce Sellarutlaw ENT: Alinda MoneyMelvin in [place] Allergist: Revonda StandardAllison in [place].   Preexposure prophylaxis, takes Truvada. Had recent bloodwork in December.   Generalized anxiety disorder:  GAD 7 : Generalized Anxiety Score 04/15/2020 04/04/2018  Nervous, Anxious, on Edge 1 1  Control/stop worrying 0 0  Worry too much - different things 0 1  Trouble relaxing 1 0  Restless 0 0  Easily annoyed or irritable 0 1  Afraid - awful might happen 0 0  Total GAD 7 Score 2 3  Anxiety Difficulty - Somewhat difficult  Multiple attempts at SSRIs in the past that were ineffective.  Has been well controlled with alprazolam 1-3 times per day and is try to taper to 2 to 3/day depending on need.  Has required Ambien for sleep but not combined with Xanax. Still needs ambien nightly, alprazolam 1-2 per day. doing well. Stressful work.  Migraine headaches: Treated by Dr. Blondell RevealJudkins in [place] with osteopathic manipulation.  Helps.  Maxalt for breakthrough symptoms.  3-4 per month. We have discussed headache specialist in [place] if needed. Dr. Yvone NeuPatton.   Allergic rhinitis, asthma.  Atrovent nasal spray, Singulair, Allegra, rare Sudafed in the past.  Has been followed by allergist, treated with Symbicort.  Allergy injections. atrovent as rescue inhaler, not albuterol. atrovent inhaler - up to daily, and prior to workout.   Hypogonadism  followed by Dr. Sharl MaKerr.  Treated with testosterone.  Unable  to tolerate Metformin due to history of IBS. Recent slight elevation in RBC, HGB, HCT - repeat on 2/28. Some continued feeling of sluggish. Feeling that way for awhile. No recent changes, thought would be better with weight loss. Plans to follow up to discuss after cardiology visit. Did have BP med decreased.   Hypertension: Now on 50mg  losartan per day (1/2 of 100). appt in few weeks with cardiology.  Home readings: 128/85 highest, usually 100/60's.  Has lost over 57#s with diet, exercise.  BP Readings from Last 3 Encounters:  12/08/20 108/66  04/22/20 126/84  04/15/20 111/75   Lab Results  Component Value Date   CREATININE 1.28 (H) 10/14/2020   Wt Readings from Last 3 Encounters:  12/08/20 231 lb (104.8 kg)  04/22/20 288 lb (130.6 kg)  04/15/20 286 lb (129.7 kg)    B12 deficiency in past - in [place].  No current supplement. Normal in 2019.  Lab Results  Component Value Date   VITAMINB12 343 04/24/2018   GERD/IBS Treated with Protonix, Bentyl as needed. Gastroenterologist Dr. Dulce Sellarutlaw.  Is also monitoring certain foods with history of diverticulitis. famotidine at night from ENT for reflux.  Still some abd pain at times. Did discuss with GI. Some pressure with urination, not every time. Plans to follow up to discuss - no recent changes - for months.   Cancer screening: Colonoscopy: 2019. Dr. Dulce Sellarutlaw, tubular adenoma x 2 repeat 5 yrs.  Prostate:  Lab Results  Component Value Date  PSA1 0.5 10/14/2020  defers DRE. The natural history of prostate cancer and ongoing controversy regarding screening and potential treatment outcomes of prostate cancer has been discussed with the patient. The meaning of a false positive PSA and a false negative PSA has been discussed. He indicates understanding of the limitations of this screening test.  Immunization History  Administered Date(s) Administered  . Hepatitis A, Adult 10/26/2013, 06/22/2014  . Hepatitis B, adult 10/26/2013,  03/22/2014, 06/22/2014  . Influenza Split 08/27/2012  . Influenza-Unspecified 08/20/2015, 08/21/2016  . Moderna Sars-Covid-2 Vaccination 01/13/2020, 02/10/2020  . Tdap 03/26/2013  covid booster in [facility].  Pneumonia vaccine at Dr. Blondell Reveal in 08/2020  Depression screen [facility] 2/9 12/08/2020 04/15/2020 11/20/2019 10/26/2019 04/17/2019  Decreased Interest 0 0 0 0 0  Down, Depressed, Hopeless 0 0 0 0 0  PHQ - 2 Score 0 0 0 0 0  Altered sleeping - 0 - - -  Tired, decreased energy - 1 - - -  Change in appetite - 0 - - -  Feeling bad or failure about yourself  - 0 - - -  Trouble concentrating - 0 - - -  Moving slowly or fidgety/restless - 0 - - -  Suicidal thoughts - 0 - - -  PHQ-9 Score - 1 - - -  Difficult doing work/chores - - - - -  Some recent data might be hidden   No exam data present No recent optho exam. Plans to schedule in [place].   Dentist: every 6 months.   Exercise - 6 -7 days per week, 1.5 hrs.  Body mass index is 32.22 kg/m. Wt Readings from Last 3 Encounters:  12/08/20 231 lb (104.8 kg)  04/22/20 288 lb (130.6 kg)  04/15/20 286 lb (129.7 kg)    History Patient Active Problem List   Diagnosis Date Noted  . Coronary artery disease involving native coronary artery of native heart without angina pectoris 03/05/2019  . Irritable bowel syndrome 03/14/2016  . Asthma 11/19/2015  . 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 rotator cuff tear 10/17/2013  . Hypogonadism male 09/04/2013  . ED (erectile dysfunction) 07/30/2013  . Palpitations 02/21/2012  . BMI 33.0-33.9,adult 02/04/2012  . Allergic rhinitis 02/04/2012  . GERD (gastroesophageal reflux disease) 02/04/2012  . Migraine 02/04/2012  . GAD (generalized anxiety disorder) 02/04/2012  . Insomnia 02/04/2012  . Essential hypertension 04/13/2009  . CHEST PAIN-UNSPECIFIED 04/13/2009   Past Medical History:  Diagnosis Date  . Anxiety   . Arthritis   . Asthma   . Depression    . GERD (gastroesophageal reflux disease)   . Hyperlipidemia    Phreesia 12/05/2020  . Seasonal allergic reaction    Past Surgical History:  Procedure Laterality Date  . LIPOMA EXCISION N/A 10/30/2018   Procedure: EXCISION OF MULTIPLE SUBCUTANEOUS LIPOMAS ON TORSO;  Surgeon: Manus Rudd, MD;  Location: [facility];  Service: General;  Laterality: N/A;  . none    . none     Allergies  Allergen Reactions  . Desloratadine Other (See Comments)    CLARINEX-"severe headache"  . Loratadine Other (See Comments)    "severe headache"  . Other   . Levbid [Hyoscyamine Sulfate] Rash  . Telithromycin Rash   Prior to Admission medications   Medication Sig Start Date End Date Taking? Authorizing Provider  acetic acid-hydrocortisone (VOSOL-HC) otic solution Place 3 drops into both ears 3 (three) times daily. Use as needed to prevent swimmer's ear 01/10/15  Yes Tonye Pearson,  MD  albuterol (VENTOLIN HFA) 108 (90 Base) MCG/ACT inhaler INHALE 2 PUFFS INTO THE LUNGS EVERY 6 HOURS AS NEEDED FOR WHEEZING OR SHORTNESS OF BREATH 07/19/20  Yes [name], MD  ALPRAZolam [name]) 1 MG tablet TAKE 1 TABLET(1 MG) BY MOUTH TWICE DAILY AS NEEDED 12/06/20  Yes [name], MD  aspirin EC 81 MG tablet Take 1 tablet (81 mg total) by mouth daily. 03/07/15  Yes Sherren Mocha, MD  benzonatate (TESSALON) 100 MG capsule TAKE 1 CAPSULE(100 MG) BY MOUTH THREE TIMES DAILY AS NEEDED FOR COUGH 04/14/19  Yes [name], MD  chlorthalidone (HYGROTON) 25 MG tablet TAKE 1 TABLET(25 MG) BY MOUTH DAILY 04/29/20  Yes Sherren Mocha, MD  cyclobenzaprine (FLEXERIL) 10 MG tablet Take 10 mg by mouth 3 (three) times daily as needed for muscle spasms.  04/08/20  Yes [provider]  Diclofenac Sodium (PENNSAID) 2 % SOLN Apply topically as needed.  04/08/20  Yes [provider]  dicyclomine (BENTYL) 20 MG tablet Take 20 mg by mouth 2 (two) times a day.   Yes [provider]   diltiazem (CARDIZEM CD) 180 MG 24 hr capsule Take 1 capsule (180 mg total) by mouth daily. 04/22/20 04/17/21 Yes Sherren Mocha, MD  emtricitabine-tenofovir (TRUVADA) 200-300 MG tablet TAKE 1 TABLET BY MOUTH DAILY 07/10/20  Yes [name], MD  famotidine (PEPCID) 40 MG tablet Take 40 mg by mouth at bedtime.  02/18/20  Yes [provider]  Fexofenadine HCl (ALLEGRA PO) Take 1 tablet by mouth daily.    Yes [provider]  hydrocortisone (ANUSOL-HC) 2.5 % rectal cream APPLY RECTALLY TWICE DAILY 06/30/20  Yes [name], MD  ipratropium (ATROVENT) 0.06 % nasal spray Place 2 sprays into the nose 2 (two) times daily. 11/03/19  Yes [name], MD  losartan (COZAAR) 100 MG tablet Take 0.5 tablets (50 mg total) by mouth daily. 10/21/20 10/16/21 Yes Sherren Mocha, MD  metoprolol succinate (TOPROL-XL) 25 MG 24 hr tablet TAKE 1 TABLET(25 MG) BY MOUTH AT BEDTIME 06/22/20  Yes Sherren Mocha, MD  montelukast (SINGULAIR) 10 MG tablet TAKE 1 TABLET(10 MG) BY MOUTH AT BEDTIME 12/04/20  Yes [name], MD  ondansetron (ZOFRAN-ODT) 4 MG disintegrating tablet Take 4 mg by mouth every 8 (eight) hours as needed for nausea or vomiting.   Yes [provider]  pantoprazole (PROTONIX) 40 MG tablet Take 40 mg by mouth 2 (two) times daily.  04/08/20  Yes [provider]  Potassium Chloride ER 20 MEQ TBCR Take 20 mEq by mouth daily. 08/01/20 07/27/21 Yes Sherren Mocha, MD  rizatriptan (MAXALT-MLT) 10 MG disintegrating tablet TAKE 1 TABLET BY MOUTH AS NEEDED FOR HEADACHE. MAY REPEAT IN 2 HOURS IF NEEDED 12/04/20  Yes [name], MD  rosuvastatin (CRESTOR) 10 MG tablet Take 1 tablet (10 mg total) by mouth daily. 04/22/20 04/17/21 Yes Sherren Mocha, MD  sildenafil (VIAGRA) 100 MG tablet Take 1/2 to 1 tablet once daily as needed prior to sexual activity. 04/26/20  Yes Sherren Mocha, MD  SYMBICORT 160-4.5 MCG/ACT inhaler Inhale 2 puffs into the lungs in the morning  and at bedtime.  02/28/20  Yes [provider]  testosterone cypionate (DEPOTESTOTERONE CYPIONATE) 100 MG/ML injection Inject 0.75 mg into the muscle every 7 (seven) days. For IM use only   Yes [provider]  zolpidem (AMBIEN) 10 MG tablet TAKE 1 TABLET(10 MG) BY MOUTH AT BEDTIME 09/27/20  Yes [name], MD  Social History   Socioeconomic History  . Marital status: Single    Spouse name: Not on file  . Number of children: 0  . Years of education: Not on file  . Highest education level: Not on file  Occupational History  . 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 Smoking status: Never Smoker  . Smokeless tobacco: Never Used  Vaping Use  . Vaping Use: Never used  Substance and Sexual Activity  . Alcohol use: Yes    Alcohol/week: 3.0 standard drinks    Types: 3 Standard drinks or equivalent per week  . Drug use: No  . Sexual activity: Yes  Other Topics Concern  . Not on file  Social History Narrative  . Not on file   Social Determinants of Health   Financial Resource Strain: Not on file  Food Insecurity: Not on file  Transportation Needs: Not on file  Physical Activity: Not on file  Stress: Not on file  Social Connections: Not on file  Intimate Partner Violence: Not on file    Review of Systems Per HPI   Objective:   Vitals:   12/08/20 1652  BP: 108/66  Pulse: 80  Temp: 98.2 F (36.8 C)  TempSrc: Temporal  SpO2: 95%  Weight: 231 lb (104.8 kg)  Height: 5\' 11"  (1.803 m)     Physical Exam Vitals reviewed.  Constitutional:      Appearance: He is well-developed.  HENT:     Head: Normocephalic and atraumatic.     Right Ear: External ear normal.     Left Ear: External ear normal.  Eyes:     Conjunctiva/sclera: Conjunctivae normal.     Pupils: Pupils are equal, round, and reactive to light.  Neck:     Thyroid: No thyromegaly.  Cardiovascular:     Rate and Rhythm: Normal rate and regular rhythm.      Heart sounds: Normal heart sounds.  Pulmonary:     Effort: Pulmonary effort is normal. No respiratory distress.     Breath sounds: Normal breath sounds. No wheezing.  Abdominal:     General: There is no distension.     Palpations: Abdomen is soft.     Tenderness: There is no abdominal tenderness.  Musculoskeletal:        General: No tenderness. Normal range of motion.     Cervical back: Normal range of motion and neck supple.  Lymphadenopathy:     Cervical: No cervical adenopathy.  Skin:    General: Skin is warm and dry.  Neurological:     Mental Status: He is alert and oriented to person, place, and time.     Deep Tendon Reflexes: Reflexes are normal and symmetric.  Psychiatric:        Behavior: Behavior normal.        Assessment & Plan:  Mathan Darroch. is a 50 y.o. male . Annual physical exam  - -anticipatory guidance as below in AVS, screening labs above. Health maintenance items as above in HPI discussed/recommended as applicable.   Routine screening for STI (sexually transmitted infection) - Plan: emtricitabine-tenofovir (TRUVADA) 200-300 MG tablet On pre-exposure prophylaxis for HIV - Plan: emtricitabine-tenofovir (TRUVADA) 200-300 MG tablet  -Recent STI screening performed, refill Truvada.  Labs performed in December.  Essential hypertension - Plan: losartan (COZAAR) 25 MG tablet  -Try lower dose of losartan to see if that may be related to the sluggishness/fatigue as he has lost significant weight.  GAD (generalized anxiety disorder)  Psychophysiological insomnia  -Stable with current dosing of alprazolam insomnia stable with Ambien.  Recheck 6 months.  Irritable bowel syndrome with diarrhea  -With intermittent abdominal discomfort.  Has been discussed with gastroenterology.  Does have history of diverticulosis.  Slight discomfort on his exam left lower quadrant.  has been present for some time, off and on.  Would consider imaging if persistent pain,  certainly if any worsening to rule out diverticulitis.   Uncomplicated asthma, unspecified asthma severity, unspecified whether persistent  -Stable with current regimen, continue allergist follow-up.  Atrovent inhaler has been helpful for as needed use.  Can refill if needed.   Migraine with aura and without status migrainosus, not intractable  -Some recent flares, still requiring Maxalt 3-4 times per month.  Did recommend meeting with headache specialist, consider medication such as Emgality but would be open to neuro/headache specialist.  Continue follow-up with provider in [place] as well as manipulation has been helpful.  Allergic rhinitis, unspecified seasonality, unspecified trigger  -Stable, continue same regimen.   Meds ordered this encounter  Medications  . emtricitabine-tenofovir (TRUVADA) 200-300 MG tablet    Sig: Take 1 tablet by mouth daily.    Dispense:  90 tablet    Refill:  1  . losartan (COZAAR) 25 MG tablet    Sig: Take 1 tablet (25 mg total) by mouth daily.    Dispense:  90 tablet    Refill:  1   Patient Instructions    Try decreasing losartan to 25mg . Keep follow up with cardiology. If sluggish feeling is persisting, return to discuss further.  Great work on the [employer]  loss!   B12 at next blood draw or on follow up with me if needed.  I would consider meeting with headache specialist as other meds may lessen need for maxalt.  Let me know if you need a referral.  If any worsening of your abdominal symptoms I would recommend being seen right away with your history of diverticulosis/diverticulitis.  If you have continued off-and-on symptoms it may be worthwhile discussing again with gastroenterology or I can certainly refer you elsewhere if needed.   Return to the clinic or go to the nearest emergency room if any of your symptoms worsen or new symptoms occur.   Keeping you healthy  Get these tests  Blood pressure- Have your blood pressure checked once a  year by your healthcare provider.  Normal blood pressure is 120/80  Weight- Have your body mass index (BMI) calculated to screen for obesity.  BMI is a measure of body fat based on height and weight. You can also calculate your own BMI at ViewBanking.si.  Cholesterol- Have your cholesterol checked every year.  Diabetes- Have your blood sugar checked regularly if you have high blood pressure, high cholesterol, have a family history of diabetes or if you are overweight.  Screening for Colon Cancer- Colonoscopy starting at age 21.  Screening may begin sooner depending on your family history and other health conditions. Follow up colonoscopy as directed by your Gastroenterologist.  Screening for Prostate Cancer- Both blood work (PSA) and a rectal exam help screen for Prostate Cancer.  Screening begins at age 29 with African-American men and at age 68 with Caucasian men.  Screening may begin sooner depending on your family history.  Take these medicines  Aspirin- One aspirin daily can help prevent Heart disease and Stroke.  Flu shot- Every fall.  Tetanus- Every 10 years  Pneumonia shot- Once after the age of 57; if  you are younger than 23, ask your healthcare provider if you need a Pneumonia shot.  Take these steps  Don't smoke- If you do smoke, talk to your doctor about quitting.  For tips on how to quit, go to www.smokefree.gov or call 1-800-QUIT-NOW.  Be physically active- Exercise 5 days a week for at least 30 minutes.  If you are not already physically active start slow and gradually work up to 30 minutes of moderate physical activity.  Examples of moderate activity include walking briskly, mowing the yard, dancing, swimming, bicycling, etc.  Eat a healthy diet- Eat a variety of healthy food such as fruits, vegetables, low fat milk, low fat cheese, yogurt, lean meant, poultry, fish, beans, tofu, etc. For more information go to www.thenutritionsource.org  Drink alcohol in  moderation- Limit alcohol intake to less than two drinks a day. Never drink and drive.  Dentist- Brush and floss twice daily; visit your dentist twice a year.  Depression- Your emotional health is as important as your physical health. If you're feeling down, or losing interest in things you would normally enjoy please talk to your healthcare provider.  Eye exam- Visit your eye doctor every year.  Safe sex- If you may be exposed to a sexually transmitted infection, use a condom.  Seat belts- Seat belts can save your life; always wear one.  Smoke/Carbon Monoxide detectors- These detectors need to be installed on the appropriate level of your home.  Replace batteries at least once a year.  Skin cancer- When out in the sun, cover up and use sunscreen 15 SPF or higher.  Violence- If anyone is threatening you, please tell your healthcare provider.  Living Will/ Health care power of attorney- Speak with your healthcare provider and family.   If you have lab work done today you will be contacted with your lab results within the next 2 weeks.  If you have not heard from [place] then please contact us. The fastest way to get your results is to register for My Chart.   IF you received an x-ray today, you will receive an invoice from [facility]. Please contact [facility] at [phone] with questions or concerns regarding your invoice.   IF you received labwork today, you will receive an invoice from [facility]. Please contact [facility] at [phone] with questions or concerns regarding your invoice.   Our billing staff will not be able to assist you with questions regarding bills from these companies.  You will be contacted with the lab results as soon as they are available. The fastest way to get your results is to activate your My Chart account. Instructions are located on the last page of this paperwork. If you have not heard from [place] regarding the results in 2 weeks, please  contact this office.         Signed, [name], MD [facility]

## 2020-12-15 DIAGNOSIS — M542 Cervicalgia: Secondary | ICD-10-CM | POA: Diagnosis not present

## 2020-12-15 DIAGNOSIS — R498 Other voice and resonance disorders: Secondary | ICD-10-CM | POA: Diagnosis not present

## 2020-12-15 DIAGNOSIS — K219 Gastro-esophageal reflux disease without esophagitis: Secondary | ICD-10-CM | POA: Diagnosis not present

## 2020-12-15 DIAGNOSIS — R490 Dysphonia: Secondary | ICD-10-CM | POA: Diagnosis not present

## 2020-12-19 ENCOUNTER — Telehealth: Payer: Self-pay

## 2020-12-19 DIAGNOSIS — G43109 Migraine with aura, not intractable, without status migrainosus: Secondary | ICD-10-CM

## 2020-12-19 NOTE — Telephone Encounter (Signed)
Lm to discuss change with pt if he can handle traditional Maxalt pill rather than disintegrating tablet.

## 2020-12-19 NOTE — Telephone Encounter (Signed)
Pt insurance will not cover rizatriptan benxoate 10 mg.   Denial from insurance stating pt can only get this if under age 52 or taking no other solid medication? Please advise?

## 2020-12-19 NOTE — Telephone Encounter (Signed)
Realized that issue may be that it is a disintegrating tablet.  See if patient is okay with taking plain Maxalt pill, and if so we can change it to Maxalt 5 mg, not disintegrating tablet and see if that is covered.

## 2020-12-19 NOTE — Telephone Encounter (Signed)
Called pt no answer left message to call back and discuss. Called pharmacy to ask about coverage they state without rx to run they will not know as the rejection letter they received does not list covered alternatives.

## 2020-12-20 DIAGNOSIS — I10 Essential (primary) hypertension: Secondary | ICD-10-CM | POA: Diagnosis not present

## 2020-12-20 DIAGNOSIS — M542 Cervicalgia: Secondary | ICD-10-CM | POA: Diagnosis not present

## 2020-12-20 DIAGNOSIS — M9902 Segmental and somatic dysfunction of thoracic region: Secondary | ICD-10-CM | POA: Diagnosis not present

## 2020-12-20 DIAGNOSIS — M9901 Segmental and somatic dysfunction of cervical region: Secondary | ICD-10-CM | POA: Diagnosis not present

## 2020-12-20 DIAGNOSIS — M545 Low back pain, unspecified: Secondary | ICD-10-CM | POA: Diagnosis not present

## 2020-12-20 NOTE — Telephone Encounter (Signed)
Patient returned Mackenzie's call. Please advise at [phone]

## 2020-12-21 ENCOUNTER — Ambulatory Visit: Payer: BC Managed Care – PPO | Admitting: Cardiovascular Disease

## 2020-12-22 ENCOUNTER — Other Ambulatory Visit: Payer: Self-pay | Admitting: Family Medicine

## 2020-12-22 DIAGNOSIS — J301 Allergic rhinitis due to pollen: Secondary | ICD-10-CM | POA: Diagnosis not present

## 2020-12-22 DIAGNOSIS — J3089 Other allergic rhinitis: Secondary | ICD-10-CM | POA: Diagnosis not present

## 2020-12-22 DIAGNOSIS — F5104 Psychophysiologic insomnia: Secondary | ICD-10-CM

## 2020-12-22 MED ORDER — RIZATRIPTAN BENZOATE 10 MG PO TABS: 10.0000 mg | ORAL_TABLET | ORAL | 0 refills | Status: DC | PRN

## 2020-12-22 NOTE — Telephone Encounter (Signed)
Maxalt changed to regular tablets.  If this is not covered then may need prior authorization or to look at other options.  Let me know

## 2020-12-22 NOTE — Telephone Encounter (Signed)
Patient is requesting a refill of the following medications: Requested Prescriptions   Pending Prescriptions Disp Refills   zolpidem (AMBIEN) 10 MG tablet [Pharmacy Med Name: ZOLPIDEM 10MG  TABLETS] 90 tablet     Sig: TAKE 1 TABLET(10 MG) BY MOUTH AT BEDTIME    Date of patient request: 12/22/20 Last office visit: 12/08/20 Date of last refill: 11.23/21 Last refill amount: 90 Follow up time period per chart:

## 2020-12-22 NOTE — Telephone Encounter (Signed)
Okay with normal pills if you want to change to normal maxalt

## 2020-12-22 NOTE — Telephone Encounter (Signed)
Requested medication (s) are due for refill today - soon  Requested medication (s) are on the active medication list-yes  Future visit scheduled -no  Last refill: 09/27/20  Notes to clinic: Request for non delegated Rx  Requested Prescriptions  Pending Prescriptions Disp Refills   zolpidem (AMBIEN) 10 MG tablet [Pharmacy Med Name: ZOLPIDEM 10MG  TABLETS] 90 tablet     Sig: TAKE 1 TABLET(10 MG) BY MOUTH AT BEDTIME      Not Delegated - Psychiatry:  Anxiolytics/Hypnotics Failed - 12/22/2020 12:48 PM      Failed - This refill cannot be delegated      Failed - Urine Drug Screen completed in last 360 days      Passed - Valid encounter within last 6 months    Recent Outpatient Visits           2 weeks ago Annual physical exam   [facility], Ranell Patrick, MD   8 months ago GAD (generalized anxiety disorder)   [facility], Ranell Patrick, MD   1 year ago Hoarseness   [facility], Ranell Patrick, MD   1 year ago GAD (generalized anxiety disorder)   [facility], Ranell Patrick, MD   1 year ago Hoarseness   [facility], Ranell Patrick, MD       Future Appointments             In 1 week Sherren Mocha, MD [place]                 Requested Prescriptions  Pending Prescriptions Disp Refills   zolpidem (AMBIEN) 10 MG tablet [Pharmacy Med Name: ZOLPIDEM 10MG  TABLETS] 90 tablet     Sig: TAKE 1 TABLET(10 MG) BY MOUTH AT BEDTIME      Not Delegated - Psychiatry:  Anxiolytics/Hypnotics Failed - 12/22/2020 12:48 PM      Failed - This refill cannot be delegated      Failed - Urine Drug Screen completed in last 360 days      Passed - Valid encounter within last 6 months    Recent Outpatient Visits           2 weeks ago Annual physical exam   [facility] [place], MD   8 months ago GAD (generalized anxiety disorder)   [facility],  Ranell Patrick, MD   1 year ago Hoarseness   [facility], Ranell Patrick, MD   1 year ago GAD (generalized anxiety disorder)   [facility], Ranell Patrick, MD   1 year ago Hoarseness   [facility], Ranell Patrick, MD       Future Appointments             In 1 week Sherren Mocha, MD [facility], [facility]

## 2020-12-22 NOTE — Telephone Encounter (Signed)
Controlled substance database (PDMP) reviewed. No concerns appreciated.  Last filled 09/27/20 - refill ordered.

## 2021-01-02 ENCOUNTER — Encounter: Payer: Self-pay | Admitting: Cardiovascular Disease

## 2021-01-02 ENCOUNTER — Ambulatory Visit: Payer: BC Managed Care – PPO | Admitting: Cardiovascular Disease

## 2021-01-02 ENCOUNTER — Other Ambulatory Visit: Payer: Self-pay

## 2021-01-02 VITALS — BP 110/74 | HR 72 | Ht 71.0 in | Wt 226.6 lb

## 2021-01-02 DIAGNOSIS — E782 Mixed hyperlipidemia: Secondary | ICD-10-CM

## 2021-01-02 DIAGNOSIS — I10 Essential (primary) hypertension: Secondary | ICD-10-CM

## 2021-01-02 DIAGNOSIS — I2510 Atherosclerotic heart disease of native coronary artery without angina pectoris: Secondary | ICD-10-CM

## 2021-01-02 DIAGNOSIS — Z79899 Other long term (current) drug therapy: Secondary | ICD-10-CM | POA: Diagnosis not present

## 2021-01-02 NOTE — Progress Notes (Signed)
Cardiology Office Note:    Date:  01/02/2021   ID:  Randall Apple., DOB 1971/03/19, MRN [id number]  PCP:  [name], MD   [facility]  Cardiologist:  Sherren Mocha, MD  Advanced Practice Provider:  No care team member to display Electrophysiologist:  None       Referring MD: [name], MD   Chief Complaint  Patient presents with  . Hypertension    History of Present Illness:    Randall Pierron. is a 50 y.o. male with a hx of: Obesity, HTN, nonobstructive CAD, and tachycardia, presenting for follow-up evaluation.   The patient is here alone today.  He is doing well.  He has made dramatic changes in his diet and lifestyle and has lost over 60 pounds since last summer.  He denies exertional dyspnea, orthopnea, or PND.  He does experience occasional chest discomfort with exercise when his heart rate goes above 140 bpm.  He also complains of low blood pressure readings when he takes all of his medications.  This has become more prominent with his weight loss.  He sometimes has systolic readings in the 10X and 90s with associated dizziness.  At other times his blood pressure is in the 140 mmHg range.  Past Medical History:  Diagnosis Date  . Anxiety   . Arthritis   . Asthma   . Depression   . GERD (gastroesophageal reflux disease)   . Hyperlipidemia    Phreesia 12/05/2020  . Seasonal allergic reaction     Past Surgical History:  Procedure Laterality Date  . LIPOMA EXCISION N/A 10/30/2018   Procedure: EXCISION OF MULTIPLE SUBCUTANEOUS LIPOMAS ON TORSO;  Surgeon: Donnie Mesa, MD;  Location: [facility];  Service: General;  Laterality: N/A;  . none    . none      Current Medications: Current Meds  Medication Sig  . acetic acid-hydrocortisone (VOSOL-HC) otic solution Place 3 drops into both ears 3 (three) times daily. Use as needed to prevent swimmer's ear  . albuterol (VENTOLIN HFA) 108 (90 Base) MCG/ACT  inhaler INHALE 2 PUFFS INTO THE LUNGS EVERY 6 HOURS AS NEEDED FOR WHEEZING OR SHORTNESS OF BREATH  . ALLEGRA ALLERGY 180 MG tablet Take 180 mg by mouth daily.  Marland Kitchen ALPRAZolam (XANAX) 1 MG tablet TAKE 1 TABLET(1 MG) BY MOUTH TWICE DAILY AS NEEDED  . amLODipine (NORVASC) 5 MG tablet 1 tablet  . aspirin EC 81 MG tablet Take 1 tablet (81 mg total) by mouth daily.  . benzonatate (TESSALON) 100 MG capsule TAKE 1 CAPSULE(100 MG) BY MOUTH THREE TIMES DAILY AS NEEDED FOR COUGH  . chlorthalidone (HYGROTON) 25 MG tablet TAKE 1 TABLET(25 MG) BY MOUTH DAILY  . cyclobenzaprine (FLEXERIL) 10 MG tablet Take 10 mg by mouth 3 (three) times daily as needed for muscle spasms.   . Diclofenac Sodium (PENNSAID) 2 % SOLN Apply topically as needed.   . dicyclomine (BENTYL) 20 MG tablet Take 20 mg by mouth 2 (two) times a day.  . diltiazem (CARDIZEM CD) 180 MG 24 hr capsule Take 1 capsule (180 mg total) by mouth daily.  Marland Kitchen emtricitabine-tenofovir (TRUVADA) 200-300 MG tablet Take 1 tablet by mouth daily.  Marland Kitchen esomeprazole (NEXIUM) 40 MG capsule Take 40 mg by mouth 2 (two) times daily.  . famotidine (PEPCID) 40 MG tablet Take 40 mg by mouth at bedtime.   Marland Kitchen Fexofenadine HCl (ALLEGRA PO) Take 1 tablet by mouth daily.   Marland Kitchen  fluticasone (FLONASE) 50 MCG/ACT nasal spray Place 2 sprays into both nostrils daily.  . Fluticasone-Salmeterol (ADVAIR) 250-50 MCG/DOSE AEPB 1 puff  . hydrocortisone (ANUSOL-HC) 2.5 % rectal cream APPLY RECTALLY TWICE DAILY  . ipratropium (ATROVENT) 0.06 % nasal spray Place 2 sprays into the nose 2 (two) times daily.  Marland Kitchen losartan (COZAAR) 25 MG tablet Take 1 tablet (25 mg total) by mouth daily.  . metoprolol succinate (TOPROL-XL) 25 MG 24 hr tablet TAKE 1 TABLET(25 MG) BY MOUTH AT BEDTIME  . montelukast (SINGULAIR) 10 MG tablet TAKE 1 TABLET(10 MG) BY MOUTH AT BEDTIME  . mupirocin ointment (BACTROBAN) 2 % SMARTSIG:1 Application Topical 2-3 Times Daily  . ondansetron (ZOFRAN-ODT) 4 MG disintegrating tablet Take  4 mg by mouth every 8 (eight) hours as needed for nausea or vomiting.  . pantoprazole (PROTONIX) 40 MG tablet Take 40 mg by mouth 2 (two) times daily.   . Potassium Chloride ER 20 MEQ TBCR Take 20 mEq by mouth daily.  . pramoxine-hydrocortisone (PROCTOCREAM-HC) 1-1 % rectal cream APPLY TO AFFECTED AREA THREE TIMES DAILY  . rizatriptan (MAXALT) 10 MG tablet Take 1 tablet (10 mg total) by mouth as needed for migraine. May repeat in 2 hours if needed  . rosuvastatin (CRESTOR) 10 MG tablet Take 1 tablet (10 mg total) by mouth daily.  . sildenafil (VIAGRA) 100 MG tablet Take 1/2 to 1 tablet once daily as needed prior to sexual activity.  . SUMAtriptan (IMITREX) 100 MG tablet Take 100 mg by mouth (1 tablet) at for signs of migraine headache. May take 1 more dose 2 hours later. Do not exceed 200 mg in 24 hours.  . SYMBICORT 160-4.5 MCG/ACT inhaler Inhale 2 puffs into the lungs in the morning and at bedtime.   Marland Kitchen testosterone cypionate (DEPOTESTOTERONE CYPIONATE) 100 MG/ML injection Inject 0.75 mg into the muscle every 7 (seven) days. For IM use only  . zolpidem (AMBIEN) 10 MG tablet TAKE 1 TABLET(10 MG) BY MOUTH AT BEDTIME     Allergies:   Desloratadine, Loratadine, Other, Levbid [hyoscyamine sulfate], and Telithromycin   Social History   Socioeconomic History  . Marital status: Single    Spouse name: Not on file  . Number of children: 0  . Years of education: Not on file  . Highest education level: Not on file  Occupational History  . 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 Smoking status: Never Smoker  . Smokeless tobacco: Never Used  Vaping Use  . Vaping Use: Never used  Substance and Sexual Activity  . Alcohol use: Yes    Alcohol/week: 3.0 standard drinks    Types: 3 Standard drinks or equivalent per week  . Drug use: No  . Sexual activity: Yes  Other Topics Concern  . Not on file  Social History Narrative  . Not on file   Social Determinants of  Health   Financial Resource Strain: Not on file  Food Insecurity: Not on file  Transportation Needs: Not on file  Physical Activity: Not on file  Stress: Not on file  Social Connections: Not on file     Family History: The patient's family history includes Breast cancer in an other family member; Cancer in an other family member; Coronary artery disease in his paternal grandfather; Coronary artery disease (age of onset: 54) in his father; Heart attack in his father, maternal grandfather, and another family member; Hyperlipidemia in an other family member; Hypertension in an other family member.  ROS:  Please see the history of present illness.    All other systems reviewed and are negative.  EKGs/Labs/Other Studies Reviewed:    The following studies were reviewed today: Cardiac CTA 04/24/2018: Aorta:  Normal size.  No calcifications.  No dissection.  Aortic Valve:  Trileaflet.  No calcifications.  Coronary Arteries:  Normal coronary origin.  Right dominance.  RCA is a large dominant artery that gives rise to PDA and PLVB. There is minimal non-calcified plaque.  Left main is a large artery that gives rise to LAD, a very small ramus intermedius and LCX arteries. Left main has no plaque.  LAD is a medium caliber vessel that gives rise to one diagonal artery. Proximal LAD has a mild mixed plaque wt the takeoff of the first diagonal artery with associated stenosis 25-50%.  D1 has no significant plaque.  RI is a very small artery that has no plaque.  LCX is a non-dominant artery that gives rise to one large OM1 branch. There is no plaque.  Other findings:  Normal pulmonary vein drainage into the left atrium.  Normal let atrial appendage without a thrombus.  Normal size of the pulmonary artery.  IMPRESSION: 1. Coronary calcium score of 7. This was 43 percentile for age and sex matched control.  2. Normal coronary origin with right dominance.  3. Mild  non-obstructive CAD in the proximal LAD, otherwise normal coronaries. Aggressive risk factor modification is recommended.  EKG:  EKG is ordered today.  The ekg ordered today demonstrates NSR 72 bpm, within normal limits  Recent Labs: 10/14/2020: ALT 19; BUN 15; Creatinine, Ser 1.28; Potassium 4.2; Sodium 137; TSH 1.330  Recent Lipid Panel    Component Value Date/Time   CHOL 98 (L) 10/14/2020 1143   TRIG 69 10/14/2020 1143   HDL 36 (L) 10/14/2020 1143   CHOLHDL 2.7 10/14/2020 1143   CHOLHDL 5.0 11/18/2015 1657   VLDL 35 (H) 11/18/2015 1657   LDLCALC 47 10/14/2020 1143     Risk Assessment/Calculations:       Physical Exam:    VS:  BP 110/74   Pulse 72   Ht 5\' 11"  (1.803 m)   Wt 226 lb 9.6 oz (102.8 kg)   SpO2 96%   BMI 31.60 kg/m     Wt Readings from Last 3 Encounters:  01/02/21 226 lb 9.6 oz (102.8 kg)  12/08/20 231 lb (104.8 kg)  04/22/20 288 lb (130.6 kg)     GEN:  Well nourished, well developed in no acute distress HEENT: Normal NECK: No JVD; No carotid bruits LYMPHATICS: No lymphadenopathy CARDIAC: RRR, no murmurs, rubs, gallops RESPIRATORY:  Clear to auscultation without rales, wheezing or rhonchi  ABDOMEN: Soft, non-tender, non-distended MUSCULOSKELETAL:  No edema; No deformity  SKIN: Warm and dry NEUROLOGIC:  Alert and oriented x 3 PSYCHIATRIC:  Normal affect   ASSESSMENT:    1. Mixed hyperlipidemia   2. Essential hypertension   3. Coronary artery disease involving native coronary artery of native heart without angina pectoris    PLAN:    In order of problems listed above:  1. Labs reviewed.  Lipids are excellent with a cholesterol of 98 and LDL cholesterol 47 mg/dL.  Liver function tests are normal with an ALT of 19.  The patient has done an excellent job with over 60 pounds of weight loss since his last visit less than 1 year ago. 2. Blood pressure is overtreated, likely secondary to his significant weight loss.  He is experiencing symptomatic  hypotension on  a daily basis.  Recommend stop chlorthalidone.  Continue metoprolol, diltiazem, and losartan at current doses.  He will reach out if he continues to have symptoms or low blood pressure readings. 3. The patient appears stable.  I reviewed his coronary CTA study from 2019.  He does experience exertional chest discomfort when his heart rate is elevated.  I have recommended an exercise Myoview stress test for further evaluation and risk stratification.  Will wait until COVID-19 testing and quarantine restrictions are lifted before proceeding with this.       Medication Adjustments/Labs and Tests Ordered: Current medicines are reviewed at length with the patient today.  Concerns regarding medicines are outlined above.  No orders of the defined types were placed in this encounter.  No orders of the defined types were placed in this encounter.   There are no Patient Instructions on file for this visit.   Signed, Sherren Mocha, MD  01/02/2021 9:00 AM    [facility]

## 2021-01-02 NOTE — Patient Instructions (Signed)
Medication Instructions:  1) STOP CHLORTHALIDONE *If you need a refill on your cardiac medications before your next appointment, please call your pharmacy*  Testing/Procedures: You will be called to schedule a nuclear stress test when Covid testing is no longer required.  Follow-Up: At [facility], you and your health needs are our priority.  As part of our continuing mission to provide you with exceptional heart care, we have created designated Provider Care Teams.  These Care Teams include your primary Cardiologist (physician) and Advanced Practice Providers (APPs -  Physician Assistants and Nurse Practitioners) who all work together to provide you with the care you need, when you need it. Your next appointment:   12 month(s) The format for your next appointment:   In Person Provider:   You may see Sherren Mocha, MD or one of the following Advanced Practice Providers on your designated Care Team:    [name], PA-C  [facility] [place], [place]

## 2021-01-03 DIAGNOSIS — M542 Cervicalgia: Secondary | ICD-10-CM | POA: Diagnosis not present

## 2021-01-03 DIAGNOSIS — M9901 Segmental and somatic dysfunction of cervical region: Secondary | ICD-10-CM | POA: Diagnosis not present

## 2021-01-03 DIAGNOSIS — J3089 Other allergic rhinitis: Secondary | ICD-10-CM | POA: Diagnosis not present

## 2021-01-03 DIAGNOSIS — M545 Low back pain, unspecified: Secondary | ICD-10-CM | POA: Diagnosis not present

## 2021-01-03 DIAGNOSIS — J301 Allergic rhinitis due to pollen: Secondary | ICD-10-CM | POA: Diagnosis not present

## 2021-01-03 DIAGNOSIS — M9902 Segmental and somatic dysfunction of thoracic region: Secondary | ICD-10-CM | POA: Diagnosis not present

## 2021-01-04 ENCOUNTER — Other Ambulatory Visit: Payer: Self-pay | Admitting: Family Medicine

## 2021-01-04 DIAGNOSIS — F411 Generalized anxiety disorder: Secondary | ICD-10-CM

## 2021-01-04 NOTE — Telephone Encounter (Signed)
Requested medication (s) are due for refill today - yes  Requested medication (s) are on the active medication list -yes  Future visit scheduled -no  Last refill: 12/06/20  Notes to clinic: Request non delegated Rx  Requested Prescriptions  Pending Prescriptions Disp Refills   ALPRAZolam (XANAX) 1 MG tablet [Pharmacy Med Name: ALPRAZOLAM 1MG  TABLETS] 60 tablet     Sig: TAKE 1 TABLET(1 MG) BY MOUTH TWICE DAILY AS NEEDED      Not Delegated - Psychiatry:  Anxiolytics/Hypnotics Failed - 01/04/2021  3:03 PM      Failed - This refill cannot be delegated      Failed - Urine Drug Screen completed in last 360 days      Passed - Valid encounter within last 6 months    Recent Outpatient Visits           3 weeks ago Annual physical exam   [facility], Ranell Patrick, MD   8 months ago GAD (generalized anxiety disorder)   [facility], Ranell Patrick, MD   1 year ago Hoarseness   [facility], Ranell Patrick, MD   1 year ago GAD (generalized anxiety disorder)   [facility], Ranell Patrick, MD   1 year ago Hoarseness   [facility], Ranell Patrick, MD                    Requested Prescriptions  Pending Prescriptions Disp Refills   ALPRAZolam [name]) 1 MG tablet [Pharmacy Med Name: ALPRAZOLAM 1MG  TABLETS] 60 tablet     Sig: TAKE 1 TABLET(1 MG) BY MOUTH TWICE DAILY AS NEEDED      Not Delegated - Psychiatry:  Anxiolytics/Hypnotics Failed - 01/04/2021  3:03 PM      Failed - This refill cannot be delegated      Failed - Urine Drug Screen completed in last 360 days      Passed - Valid encounter within last 6 months    Recent Outpatient Visits           3 weeks ago Annual physical exam   [facility], Ranell Patrick, MD   8 months ago GAD (generalized anxiety disorder)   [facility], Ranell Patrick, MD   1 year ago Hoarseness   [facility], Ranell Patrick, MD   1 year ago GAD  (generalized anxiety disorder)   [facility], Ranell Patrick, MD   1 year ago Hoarseness   [facility], Ranell Patrick, MD

## 2021-01-05 NOTE — Telephone Encounter (Signed)
Controlled substance database (PDMP) reviewed. No concerns appreciated. Last filled 12/06/20 - refill ordered.

## 2021-01-10 DIAGNOSIS — R0602 Shortness of breath: Secondary | ICD-10-CM | POA: Diagnosis not present

## 2021-01-10 DIAGNOSIS — Z03818 Encounter for observation for suspected exposure to other biological agents ruled out: Secondary | ICD-10-CM | POA: Diagnosis not present

## 2021-01-10 DIAGNOSIS — R519 Headache, unspecified: Secondary | ICD-10-CM | POA: Diagnosis not present

## 2021-01-10 DIAGNOSIS — J029 Acute pharyngitis, unspecified: Secondary | ICD-10-CM | POA: Diagnosis not present

## 2021-01-10 DIAGNOSIS — R059 Cough, unspecified: Secondary | ICD-10-CM | POA: Diagnosis not present

## 2021-01-17 DIAGNOSIS — N3941 Urge incontinence: Secondary | ICD-10-CM | POA: Diagnosis not present

## 2021-01-17 DIAGNOSIS — M9903 Segmental and somatic dysfunction of lumbar region: Secondary | ICD-10-CM | POA: Diagnosis not present

## 2021-01-17 DIAGNOSIS — R350 Frequency of micturition: Secondary | ICD-10-CM | POA: Diagnosis not present

## 2021-01-17 DIAGNOSIS — M9901 Segmental and somatic dysfunction of cervical region: Secondary | ICD-10-CM | POA: Diagnosis not present

## 2021-01-17 DIAGNOSIS — H6981 Other specified disorders of Eustachian tube, right ear: Secondary | ICD-10-CM | POA: Diagnosis not present

## 2021-01-17 DIAGNOSIS — M542 Cervicalgia: Secondary | ICD-10-CM | POA: Diagnosis not present

## 2021-01-17 DIAGNOSIS — J301 Allergic rhinitis due to pollen: Secondary | ICD-10-CM | POA: Diagnosis not present

## 2021-01-17 DIAGNOSIS — M9902 Segmental and somatic dysfunction of thoracic region: Secondary | ICD-10-CM | POA: Diagnosis not present

## 2021-01-17 DIAGNOSIS — M545 Low back pain, unspecified: Secondary | ICD-10-CM | POA: Diagnosis not present

## 2021-01-17 DIAGNOSIS — J3089 Other allergic rhinitis: Secondary | ICD-10-CM | POA: Diagnosis not present

## 2021-01-31 DIAGNOSIS — M5481 Occipital neuralgia: Secondary | ICD-10-CM | POA: Diagnosis not present

## 2021-01-31 DIAGNOSIS — J301 Allergic rhinitis due to pollen: Secondary | ICD-10-CM | POA: Diagnosis not present

## 2021-01-31 DIAGNOSIS — M542 Cervicalgia: Secondary | ICD-10-CM | POA: Diagnosis not present

## 2021-01-31 DIAGNOSIS — M9902 Segmental and somatic dysfunction of thoracic region: Secondary | ICD-10-CM | POA: Diagnosis not present

## 2021-01-31 DIAGNOSIS — M545 Low back pain, unspecified: Secondary | ICD-10-CM | POA: Diagnosis not present

## 2021-01-31 DIAGNOSIS — J3089 Other allergic rhinitis: Secondary | ICD-10-CM | POA: Diagnosis not present

## 2021-01-31 DIAGNOSIS — M9901 Segmental and somatic dysfunction of cervical region: Secondary | ICD-10-CM | POA: Diagnosis not present

## 2021-02-02 DIAGNOSIS — J3089 Other allergic rhinitis: Secondary | ICD-10-CM | POA: Diagnosis not present

## 2021-02-02 DIAGNOSIS — J4540 Moderate persistent asthma, uncomplicated: Secondary | ICD-10-CM | POA: Diagnosis not present

## 2021-02-02 DIAGNOSIS — J301 Allergic rhinitis due to pollen: Secondary | ICD-10-CM | POA: Diagnosis not present

## 2021-02-03 ENCOUNTER — Encounter: Payer: Self-pay | Admitting: Family Medicine

## 2021-02-03 ENCOUNTER — Other Ambulatory Visit: Payer: Self-pay | Admitting: Family Medicine

## 2021-02-03 DIAGNOSIS — F411 Generalized anxiety disorder: Secondary | ICD-10-CM

## 2021-02-06 ENCOUNTER — Other Ambulatory Visit: Payer: Self-pay | Admitting: Family Medicine

## 2021-02-06 DIAGNOSIS — F411 Generalized anxiety disorder: Secondary | ICD-10-CM

## 2021-02-07 NOTE — Telephone Encounter (Signed)
Controlled substance database (PDMP) reviewed. Last filled 01/05/21.No concerns appreciated. Refilled.

## 2021-02-09 NOTE — Telephone Encounter (Signed)
Can you refill medications for this patient ?

## 2021-02-21 DIAGNOSIS — M9901 Segmental and somatic dysfunction of cervical region: Secondary | ICD-10-CM | POA: Diagnosis not present

## 2021-02-21 DIAGNOSIS — M9902 Segmental and somatic dysfunction of thoracic region: Secondary | ICD-10-CM | POA: Diagnosis not present

## 2021-02-21 DIAGNOSIS — M542 Cervicalgia: Secondary | ICD-10-CM | POA: Diagnosis not present

## 2021-02-21 DIAGNOSIS — M545 Low back pain, unspecified: Secondary | ICD-10-CM | POA: Diagnosis not present

## 2021-02-23 DIAGNOSIS — M542 Cervicalgia: Secondary | ICD-10-CM | POA: Diagnosis not present

## 2021-02-23 DIAGNOSIS — R498 Other voice and resonance disorders: Secondary | ICD-10-CM | POA: Diagnosis not present

## 2021-02-23 DIAGNOSIS — R490 Dysphonia: Secondary | ICD-10-CM | POA: Diagnosis not present

## 2021-02-23 DIAGNOSIS — K219 Gastro-esophageal reflux disease without esophagitis: Secondary | ICD-10-CM | POA: Diagnosis not present

## 2021-03-01 DIAGNOSIS — J3089 Other allergic rhinitis: Secondary | ICD-10-CM | POA: Diagnosis not present

## 2021-03-01 DIAGNOSIS — J301 Allergic rhinitis due to pollen: Secondary | ICD-10-CM | POA: Diagnosis not present

## 2021-03-02 ENCOUNTER — Telehealth: Payer: Self-pay

## 2021-03-02 NOTE — Telephone Encounter (Signed)
Per Dr. Antionette Char 01/02/21 office note, " 1. The patient appears stable.  I reviewed his coronary CTA study from 2019.  He does experience exertional chest discomfort when his heart rate is elevated.  I have recommended an exercise Myoview stress test for further evaluation and risk stratification.  Will wait until COVID-19 testing and quarantine restrictions are lifted before proceeding with this."

## 2021-03-12 DIAGNOSIS — R5383 Other fatigue: Secondary | ICD-10-CM | POA: Insufficient documentation

## 2021-03-14 DIAGNOSIS — M9903 Segmental and somatic dysfunction of lumbar region: Secondary | ICD-10-CM | POA: Diagnosis not present

## 2021-03-14 DIAGNOSIS — M9901 Segmental and somatic dysfunction of cervical region: Secondary | ICD-10-CM | POA: Diagnosis not present

## 2021-03-14 DIAGNOSIS — M9905 Segmental and somatic dysfunction of pelvic region: Secondary | ICD-10-CM | POA: Diagnosis not present

## 2021-03-14 DIAGNOSIS — M542 Cervicalgia: Secondary | ICD-10-CM | POA: Diagnosis not present

## 2021-03-14 DIAGNOSIS — M25562 Pain in left knee: Secondary | ICD-10-CM | POA: Diagnosis not present

## 2021-03-14 DIAGNOSIS — M79605 Pain in left leg: Secondary | ICD-10-CM | POA: Diagnosis not present

## 2021-03-14 DIAGNOSIS — M545 Low back pain, unspecified: Secondary | ICD-10-CM | POA: Diagnosis not present

## 2021-03-14 DIAGNOSIS — M9902 Segmental and somatic dysfunction of thoracic region: Secondary | ICD-10-CM | POA: Diagnosis not present

## 2021-03-15 DIAGNOSIS — U071 COVID-19: Secondary | ICD-10-CM | POA: Diagnosis not present

## 2021-03-15 DIAGNOSIS — J301 Allergic rhinitis due to pollen: Secondary | ICD-10-CM | POA: Diagnosis not present

## 2021-03-15 DIAGNOSIS — Z20822 Contact with and (suspected) exposure to covid-19: Secondary | ICD-10-CM | POA: Diagnosis not present

## 2021-03-15 DIAGNOSIS — J3089 Other allergic rhinitis: Secondary | ICD-10-CM | POA: Diagnosis not present

## 2021-03-23 ENCOUNTER — Other Ambulatory Visit: Payer: Self-pay | Admitting: Cardiovascular Disease

## 2021-03-23 ENCOUNTER — Other Ambulatory Visit: Payer: Self-pay | Admitting: Family Medicine

## 2021-03-23 DIAGNOSIS — F411 Generalized anxiety disorder: Secondary | ICD-10-CM

## 2021-03-23 DIAGNOSIS — F5104 Psychophysiologic insomnia: Secondary | ICD-10-CM

## 2021-03-23 NOTE — Telephone Encounter (Signed)
LFD 12/22/20 #90 with no refills LOV 12/08/20 NOV 06/09/21

## 2021-03-23 NOTE — Telephone Encounter (Signed)
Controlled substance database (PDMP) reviewed. No concerns appreciated.  Alprazolam last refilled 02/23/21, ambien #90 on 12/22/20. OV with physical in 12/08/20. Refills ordered.

## 2021-03-28 DIAGNOSIS — M542 Cervicalgia: Secondary | ICD-10-CM | POA: Diagnosis not present

## 2021-03-28 DIAGNOSIS — M9903 Segmental and somatic dysfunction of lumbar region: Secondary | ICD-10-CM | POA: Diagnosis not present

## 2021-03-28 DIAGNOSIS — Z8616 Personal history of COVID-19: Secondary | ICD-10-CM | POA: Diagnosis not present

## 2021-03-28 DIAGNOSIS — R7989 Other specified abnormal findings of blood chemistry: Secondary | ICD-10-CM | POA: Diagnosis not present

## 2021-03-28 DIAGNOSIS — M9905 Segmental and somatic dysfunction of pelvic region: Secondary | ICD-10-CM | POA: Diagnosis not present

## 2021-03-28 DIAGNOSIS — M9906 Segmental and somatic dysfunction of lower extremity: Secondary | ICD-10-CM | POA: Diagnosis not present

## 2021-03-28 DIAGNOSIS — M9901 Segmental and somatic dysfunction of cervical region: Secondary | ICD-10-CM | POA: Diagnosis not present

## 2021-03-29 DIAGNOSIS — J301 Allergic rhinitis due to pollen: Secondary | ICD-10-CM | POA: Diagnosis not present

## 2021-03-29 DIAGNOSIS — J3089 Other allergic rhinitis: Secondary | ICD-10-CM | POA: Diagnosis not present

## 2021-04-11 DIAGNOSIS — M9905 Segmental and somatic dysfunction of pelvic region: Secondary | ICD-10-CM | POA: Diagnosis not present

## 2021-04-11 DIAGNOSIS — M9906 Segmental and somatic dysfunction of lower extremity: Secondary | ICD-10-CM | POA: Diagnosis not present

## 2021-04-11 DIAGNOSIS — M9901 Segmental and somatic dysfunction of cervical region: Secondary | ICD-10-CM | POA: Diagnosis not present

## 2021-04-11 DIAGNOSIS — Z8616 Personal history of COVID-19: Secondary | ICD-10-CM | POA: Diagnosis not present

## 2021-04-11 DIAGNOSIS — M542 Cervicalgia: Secondary | ICD-10-CM | POA: Diagnosis not present

## 2021-04-11 DIAGNOSIS — M79661 Pain in right lower leg: Secondary | ICD-10-CM | POA: Diagnosis not present

## 2021-04-11 DIAGNOSIS — M9903 Segmental and somatic dysfunction of lumbar region: Secondary | ICD-10-CM | POA: Diagnosis not present

## 2021-04-12 DIAGNOSIS — J301 Allergic rhinitis due to pollen: Secondary | ICD-10-CM | POA: Diagnosis not present

## 2021-04-12 DIAGNOSIS — J3089 Other allergic rhinitis: Secondary | ICD-10-CM | POA: Diagnosis not present

## 2021-04-12 DIAGNOSIS — M79661 Pain in right lower leg: Secondary | ICD-10-CM | POA: Diagnosis not present

## 2021-04-14 DIAGNOSIS — E230 Hypopituitarism: Secondary | ICD-10-CM | POA: Diagnosis not present

## 2021-04-14 DIAGNOSIS — G4730 Sleep apnea, unspecified: Secondary | ICD-10-CM | POA: Diagnosis not present

## 2021-04-18 DIAGNOSIS — Z1152 Encounter for screening for COVID-19: Secondary | ICD-10-CM | POA: Diagnosis not present

## 2021-04-19 DIAGNOSIS — J301 Allergic rhinitis due to pollen: Secondary | ICD-10-CM | POA: Diagnosis not present

## 2021-04-19 DIAGNOSIS — J3089 Other allergic rhinitis: Secondary | ICD-10-CM | POA: Diagnosis not present

## 2021-04-24 DIAGNOSIS — K219 Gastro-esophageal reflux disease without esophagitis: Secondary | ICD-10-CM | POA: Diagnosis not present

## 2021-04-24 DIAGNOSIS — J382 Nodules of vocal cords: Secondary | ICD-10-CM | POA: Diagnosis not present

## 2021-04-24 DIAGNOSIS — G4730 Sleep apnea, unspecified: Secondary | ICD-10-CM | POA: Diagnosis not present

## 2021-04-24 DIAGNOSIS — R490 Dysphonia: Secondary | ICD-10-CM | POA: Diagnosis not present

## 2021-04-25 DIAGNOSIS — E559 Vitamin D deficiency, unspecified: Secondary | ICD-10-CM | POA: Diagnosis not present

## 2021-04-25 DIAGNOSIS — M9903 Segmental and somatic dysfunction of lumbar region: Secondary | ICD-10-CM | POA: Diagnosis not present

## 2021-04-25 DIAGNOSIS — M79661 Pain in right lower leg: Secondary | ICD-10-CM | POA: Diagnosis not present

## 2021-04-25 DIAGNOSIS — M9906 Segmental and somatic dysfunction of lower extremity: Secondary | ICD-10-CM | POA: Diagnosis not present

## 2021-04-25 DIAGNOSIS — R102 Pelvic and perineal pain: Secondary | ICD-10-CM | POA: Diagnosis not present

## 2021-04-25 DIAGNOSIS — M9905 Segmental and somatic dysfunction of pelvic region: Secondary | ICD-10-CM | POA: Diagnosis not present

## 2021-04-25 DIAGNOSIS — M9901 Segmental and somatic dysfunction of cervical region: Secondary | ICD-10-CM | POA: Diagnosis not present

## 2021-04-25 DIAGNOSIS — M542 Cervicalgia: Secondary | ICD-10-CM | POA: Diagnosis not present

## 2021-04-26 DIAGNOSIS — J3089 Other allergic rhinitis: Secondary | ICD-10-CM | POA: Diagnosis not present

## 2021-04-26 DIAGNOSIS — J301 Allergic rhinitis due to pollen: Secondary | ICD-10-CM | POA: Diagnosis not present

## 2021-05-04 ENCOUNTER — Other Ambulatory Visit: Payer: Self-pay

## 2021-05-04 DIAGNOSIS — F411 Generalized anxiety disorder: Secondary | ICD-10-CM

## 2021-05-04 MED ORDER — ALPRAZOLAM 1 MG PO TABS: ORAL_TABLET | ORAL | 0 refills | Status: DC

## 2021-05-04 NOTE — Telephone Encounter (Signed)
Last refill: 03/23/21 #60, 0 Last OV: 12/08/20 dx. CPE

## 2021-05-04 NOTE — Telephone Encounter (Signed)
Controlled substance database (PDMP) reviewed. No concerns appreciated.  Last filled 03/24/21. Refill ordered.

## 2021-05-10 DIAGNOSIS — J3089 Other allergic rhinitis: Secondary | ICD-10-CM | POA: Diagnosis not present

## 2021-05-10 DIAGNOSIS — J301 Allergic rhinitis due to pollen: Secondary | ICD-10-CM | POA: Diagnosis not present

## 2021-05-12 ENCOUNTER — Other Ambulatory Visit: Payer: Self-pay | Admitting: Family Medicine

## 2021-05-12 DIAGNOSIS — I10 Essential (primary) hypertension: Secondary | ICD-10-CM

## 2021-05-12 DIAGNOSIS — F411 Generalized anxiety disorder: Secondary | ICD-10-CM

## 2021-05-15 ENCOUNTER — Other Ambulatory Visit: Payer: Self-pay | Admitting: *Deleted

## 2021-05-15 DIAGNOSIS — I2510 Atherosclerotic heart disease of native coronary artery without angina pectoris: Secondary | ICD-10-CM

## 2021-05-15 DIAGNOSIS — R0789 Other chest pain: Secondary | ICD-10-CM

## 2021-05-15 NOTE — Progress Notes (Signed)
Order placed for stress test.  Will route to [name] to sign attestation.

## 2021-05-18 DIAGNOSIS — J4540 Moderate persistent asthma, uncomplicated: Secondary | ICD-10-CM | POA: Diagnosis not present

## 2021-05-18 DIAGNOSIS — J3089 Other allergic rhinitis: Secondary | ICD-10-CM | POA: Diagnosis not present

## 2021-05-18 DIAGNOSIS — J301 Allergic rhinitis due to pollen: Secondary | ICD-10-CM | POA: Diagnosis not present

## 2021-05-18 DIAGNOSIS — K219 Gastro-esophageal reflux disease without esophagitis: Secondary | ICD-10-CM | POA: Diagnosis not present

## 2021-05-23 ENCOUNTER — Other Ambulatory Visit: Payer: Self-pay | Admitting: Cardiovascular Disease

## 2021-05-25 ENCOUNTER — Other Ambulatory Visit: Payer: Self-pay

## 2021-05-25 ENCOUNTER — Ambulatory Visit: Payer: BC Managed Care – PPO | Admitting: Family Medicine

## 2021-05-25 ENCOUNTER — Other Ambulatory Visit (HOSPITAL_COMMUNITY)
Admission: RE | Admit: 2021-05-25 | Discharge: 2021-05-25 | Disposition: A | Discharge status: Home / Self Care | Payer: BC Managed Care – PPO | Source: Ambulatory Visit | Attending: Family Medicine | Admitting: Family Medicine

## 2021-05-25 ENCOUNTER — Encounter: Payer: Self-pay | Admitting: Family Medicine

## 2021-05-25 VITALS — BP 138/82 | HR 86 | Temp 98.3°F | Resp 17 | Ht 71.0 in | Wt 241.4 lb

## 2021-05-25 DIAGNOSIS — Z8719 Personal history of other diseases of the digestive system: Secondary | ICD-10-CM | POA: Diagnosis not present

## 2021-05-25 DIAGNOSIS — Z125 Encounter for screening for malignant neoplasm of prostate: Secondary | ICD-10-CM

## 2021-05-25 DIAGNOSIS — R1030 Lower abdominal pain, unspecified: Secondary | ICD-10-CM | POA: Insufficient documentation

## 2021-05-25 DIAGNOSIS — Z113 Encounter for screening for infections with a predominantly sexual mode of transmission: Secondary | ICD-10-CM

## 2021-05-25 DIAGNOSIS — J45909 Unspecified asthma, uncomplicated: Secondary | ICD-10-CM

## 2021-05-25 DIAGNOSIS — F5104 Psychophysiologic insomnia: Secondary | ICD-10-CM

## 2021-05-25 DIAGNOSIS — R300 Dysuria: Secondary | ICD-10-CM | POA: Diagnosis not present

## 2021-05-25 DIAGNOSIS — I10 Essential (primary) hypertension: Secondary | ICD-10-CM

## 2021-05-25 DIAGNOSIS — J309 Allergic rhinitis, unspecified: Secondary | ICD-10-CM

## 2021-05-25 DIAGNOSIS — F411 Generalized anxiety disorder: Secondary | ICD-10-CM

## 2021-05-25 DIAGNOSIS — Z79899 Other long term (current) drug therapy: Secondary | ICD-10-CM

## 2021-05-25 LAB — POCT URINALYSIS DIP (MANUAL ENTRY)
Bilirubin, UA: NEGATIVE
Glucose, UA: NEGATIVE mg/dL
Ketones, POC UA: NEGATIVE mg/dL
Leukocytes, UA: NEGATIVE
Nitrite, UA: NEGATIVE
Protein Ur, POC: NEGATIVE mg/dL
Spec Grav, UA: 1.02 (ref 1.010–1.025)
Urobilinogen, UA: 0.2 E.U./dL
pH, UA: 5 (ref 5.0–8.0)

## 2021-05-25 LAB — CBC WITH DIFFERENTIAL/PLATELET
Basophils Absolute: 0 10*3/uL (ref 0.0–0.1)
Basophils Relative: 0.5 % (ref 0.0–3.0)
Eosinophils Absolute: 0.1 10*3/uL (ref 0.0–0.7)
Eosinophils Relative: 0.7 % (ref 0.0–5.0)
HCT: 49.6 % (ref 39.0–52.0)
Hemoglobin: 17 g/dL (ref 13.0–17.0)
Lymphocytes Relative: 28.4 % (ref 12.0–46.0)
Lymphs Abs: 2.1 10*3/uL (ref 0.7–4.0)
MCHC: 34.3 g/dL (ref 30.0–36.0)
MCV: 90.1 fl (ref 78.0–100.0)
Monocytes Absolute: 0.6 10*3/uL (ref 0.1–1.0)
Monocytes Relative: 8.7 % (ref 3.0–12.0)
Neutro Abs: 4.5 10*3/uL (ref 1.4–7.7)
Neutrophils Relative %: 61.7 % (ref 43.0–77.0)
Platelets: 193 10*3/uL (ref 150.0–400.0)
RBC: 5.51 Mil/uL (ref 4.22–5.81)
RDW: 13.6 % (ref 11.5–15.5)
WBC: 7.4 10*3/uL (ref 4.0–10.5)

## 2021-05-25 LAB — PSA: PSA: 0.79 ng/mL (ref 0.10–4.00)

## 2021-05-25 MED ORDER — LOSARTAN POTASSIUM 25 MG PO TABS: 25.0000 mg | ORAL_TABLET | Freq: Every day | ORAL | 1 refills | Status: DC

## 2021-05-25 MED ORDER — IPRATROPIUM BROMIDE 0.06 % NA SOLN: 2.0000 | Freq: Two times a day (BID) | NASAL | 11 refills | Status: AC

## 2021-05-25 MED ORDER — MONTELUKAST SODIUM 10 MG PO TABS: ORAL_TABLET | ORAL | 3 refills | Status: DC

## 2021-05-25 MED ORDER — EMTRICITABINE-TENOFOVIR DF 200-300 MG PO TABS: 1.0000 | ORAL_TABLET | Freq: Every day | ORAL | 1 refills | Status: DC

## 2021-05-25 MED ORDER — ALPRAZOLAM 1 MG PO TABS: ORAL_TABLET | ORAL | 0 refills | Status: DC

## 2021-05-25 NOTE — Addendum Note (Signed)
Addended by: Patrcia Dolly on: 05/25/2021 01:45 PM   Modules accepted: Orders

## 2021-05-25 NOTE — Progress Notes (Signed)
Subjective:  Patient ID: Randall Apple., male    DOB: Nov 28, 1970  Age: 50 y.o. MRN: [id number]  CC:  Chief Complaint  Patient presents with   Anxiety    Pt here to follow up on anxiety, no concerns today   Abdominal Pain    Pt reports lower abdominal pain and pain with urination UA clear at check a month ago on and off sxs for 6 weeks    Immunizations    Pt due for pneumonia and is agreeable to having done today    Hypertension    Pt in need of refill on medications today denies physical sxs, BP in range in office today     HPI Randall Reed. presents for  Multiple concerns above.   Abdominal pain: On and off for past 8 months. Few days in a row each week, then resolved. Hard to have bowel movement - small amount at a time, but no hard stools. No blood in stools. . Not waking up at night.  No fever, nausea or vomiting. No recent changes. Discussed with gastroenterology - [name] about 7 months ago, due for follow up. When he saw GI - not concerned. Hx of diverticulitis, IBS.  Takes bentyl BID - possibly some help, still some pain. Miralax once per day. Zofran 1-2 days per week for nausea. No recent hemorrhoid flare. On protonix BID. Famotidine 40mg  QHS for vocal cord irritation per ENT - resolved in June per ENT. Heartburn doing ok.  When having small amounts of stool and flares of pain - hurts to urinate and difficulty with onset of urination. Improves after BM.  No penile discharge. Not recently sexually active. No hematuria.   Discussed with provider in [place] - normal UA few weeks ago.  Last documented diverticulitis in May 2019.  (CT abdomen pelvis in April 2019 with mild acute sigmoid diverticulitis) Current pain flare since Saturday. 5-6 days.   PREP: On Truvada, no new contacts as above, no new side effects.  Due for sti screening (on Truvada for prep).  U/A 04/25/21 - negative.   Anxiety: See prior notes. Unable to tolerate SSRI. 1-3 per day depending on  stress. Average of 2 per day prior - had decreased doses.. New job going better than [employer]. 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  Ambien 10mg  Qpm, not combining with xanax.  Xanax needs refill - unable to refill as in [place], need locally - in [place] full time.  Meds working well.  Controlled substance database (PDMP) reviewed. No concerns appreciated. Last filled xanax 6/29 for # 6 from Dr. Woodfin Ganja, prior 03/24/21 # 94 from me. Ambien #90 on 03/24/21.   Allergic Rhinitis: Prior pt of Dr. Donneta Romberg, now at [facility] Singulair, allegra, atrovent nasal spray, and symbicort working well (rx by allergist).  Hypertension: Losartan 25mg  qd, toprol, cardizem. [name]. Planning on stress test.  Home readings: 120/70 or lower. No new side effects.  BP Readings from Last 3 Encounters:  05/25/21 138/82  01/02/21 110/74  12/08/20 108/66   Lab Results  Component Value Date   CREATININE 1.28 (H) 10/14/2020     May have received pneumonia vaccine with Dr. Woodfin Ganja. He will clarify.   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[facility]) 06/04/2014   rotator cuff tear 10/17/2013   Hypogonadism male 09/04/2013   ED (erectile dysfunction) 07/30/2013   Palpitations 02/21/2012   BMI 33.0-33.9,adult 02/04/2012   Allergic rhinitis 02/04/2012   GERD (gastroesophageal reflux disease) 02/04/2012    Migraine 02/04/2012   GAD (generalized anxiety disorder) 02/04/2012   Insomnia 02/04/2012   Essential hypertension 04/13/2009   CHEST PAIN-UNSPECIFIED 04/13/2009   Past Medical History:  Diagnosis Date   Anxiety    Arthritis    Asthma    Depression    GERD (gastroesophageal reflux disease)    Hyperlipidemia    Phreesia 12/05/2020   Seasonal allergic reaction    Past Surgical History:  Procedure Laterality Date   LIPOMA EXCISION N/A 10/30/2018   Procedure: EXCISION OF MULTIPLE SUBCUTANEOUS LIPOMAS ON TORSO;  Surgeon: Donnie Mesa, MD;  Location: [facility];  Service: General;  Laterality: N/A;   none     none     Allergies  Allergen Reactions   Desloratadine Other (See Comments)    CLARINEX-"severe headache"   Loratadine Other (See Comments)    "severe headache"   Other    Levbid [Hyoscyamine Sulfate] Rash   Telithromycin Rash   Prior to Admission medications   Medication Sig Start Date End Date Taking? Authorizing Provider  acetic acid-hydrocortisone (VOSOL-HC) otic solution Place 3 drops into both ears 3 (three) times daily. Use as needed to prevent swimmer's ear 01/10/15   Leandrew Koyanagi, MD  albuterol (VENTOLIN HFA) 108 (90 Base) MCG/ACT inhaler INHALE 2 PUFFS INTO THE LUNGS EVERY 6 HOURS AS NEEDED FOR WHEEZING OR SHORTNESS OF BREATH 07/19/20   [name], MD  [facility] ALLERGY 180 MG tablet Take 180 mg by mouth daily. 07/12/20   [provider]  ALPRAZolam [name]) 1 MG tablet TAKE 1 TABLET(1 MG) BY MOUTH TWICE DAILY AS NEEDED 05/04/21   [name], MD  aspirin EC 81 MG tablet Take 1 tablet (81 mg total) by mouth daily. 03/07/15   Sherren Mocha, MD  cyclobenzaprine (FLEXERIL) 10 MG tablet Take 10 mg by mouth 3 (three) times daily as needed for muscle spasms.  04/08/20   [provider]  Diclofenac Sodium (PENNSAID) 2 % SOLN Apply topically as needed.  04/08/20   [provider]  dicyclomine (BENTYL) 20 MG tablet Take  20 mg by mouth 2 (two) times a day.    [provider]  diltiazem (CARDIZEM CD) 180 MG 24 hr capsule TAKE 1 CAPSULE(180 MG) BY MOUTH DAILY 03/23/21   Sherren Mocha, MD  emtricitabine-tenofovir (TRUVADA) 200-300 MG tablet Take 1 tablet by mouth daily. 12/08/20   [name], MD  famotidine (PEPCID) 40 MG tablet Take 40 mg by mouth at bedtime.  02/18/20   [provider]  Fexofenadine HCl (ALLEGRA PO) Take 1 tablet by mouth daily.     [provider]  fluticasone (FLONASE) 50 MCG/ACT nasal spray Place 2 sprays into both nostrils daily. 07/12/20   [provider]  hydrocortisone (ANUSOL-HC) 2.5 % rectal cream APPLY RECTALLY TWICE DAILY 06/30/20   [name], MD  ipratropium (ATROVENT) 0.06 % nasal spray Place 2 sprays into the nose 2 (two) times daily. 11/03/19   Carlota Raspberry,  Ranell Patrick, MD  losartan (COZAAR) 25 MG tablet Take 1 tablet (25 mg total) by mouth daily. 12/08/20   [name], MD  metoprolol succinate (TOPROL-XL) 25 MG 24 hr tablet TAKE 1 TABLET(25 MG) BY MOUTH AT BEDTIME 05/24/21   Sherren Mocha, MD  montelukast (SINGULAIR) 10 MG tablet TAKE 1 TABLET(10 MG) BY MOUTH AT BEDTIME 12/04/20   [name], MD  mupirocin ointment (BACTROBAN) 2 % SMARTSIG:1 Application Topical 2-3 Times Daily 08/23/20   [provider]  ondansetron (ZOFRAN-ODT) 4 MG disintegrating tablet Take 4 mg by mouth every 8 (eight) hours as needed for nausea or vomiting.    [provider]  pantoprazole (PROTONIX) 40 MG tablet Take 40 mg by mouth 2 (two) times daily.  04/08/20   [provider]  Potassium Chloride ER 20 MEQ TBCR Take 20 mEq by mouth daily. 08/01/20 07/27/21  Sherren Mocha, MD  pramoxine-hydrocortisone (PROCTOCREAM-HC) 1-1 % rectal cream APPLY TO AFFECTED AREA THREE TIMES DAILY    [provider]  rizatriptan (MAXALT) 10 MG tablet Take 1 tablet (10 mg total) by mouth as needed for migraine. May repeat in 2 hours if needed  12/22/20   [name], MD  rosuvastatin (CRESTOR) 10 MG tablet TAKE 1 TABLET(10 MG) BY MOUTH DAILY 03/23/21   Sherren Mocha, MD  sildenafil (VIAGRA) 100 MG tablet TAKE 1/2 TO 1 TABLET BY MOUTH ONCE DAILY AS NEEDED PRIOR TO SEXUAL ACTIVITY 03/23/21   Sherren Mocha, MD  SUMAtriptan (IMITREX) 100 MG tablet Take 100 mg by mouth (1 tablet) at for signs of migraine headache. May take 1 more dose 2 hours later. Do not exceed 200 mg in 24 hours. 12/20/20   [provider]  SYMBICORT 160-4.5 MCG/ACT inhaler Inhale 2 puffs into the lungs in the morning and at bedtime.  02/28/20   [provider]  testosterone cypionate (DEPOTESTOTERONE CYPIONATE) 100 MG/ML injection Inject 0.75 mg into the muscle every 7 (seven) days. 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)  Tobacco Use   Smoking status: Never   Smokeless tobacco: Never  Vaping Use   Vaping Use: Never used  Substance and Sexual Activity   Alcohol use: Yes    Alcohol/week: 3.0 standard drinks    Types: 3 Standard drinks or equivalent per week   Drug use: No   Sexual activity: Yes  Other Topics Concern   Not on file  Social History Narrative   Not on file   Social Determinants of Health   Financial Resource Strain: Not on file  Food Insecurity: Not on file  Transportation Needs: Not on file  Physical Activity: Not on file  Stress: Not on file  Social Connections: Not on file  Intimate Partner Violence: Not on file    Review of Systems Per HPI.   Objective:   Vitals:   05/25/21 1042  BP: 138/82  Pulse: 86  Resp: 17  Temp: 98.3  F (36.8 C)  TempSrc: Temporal  SpO2: 96%  Weight: 241 lb 6.4 oz (109.5 kg)  Height: 5\' 11"  (1.803 m)     Physical Exam Vitals reviewed.  Constitutional:      Appearance: He is well-developed.  HENT:     Head: Normocephalic and atraumatic.  Neck:     Vascular: No carotid bruit or JVD.  Cardiovascular:     Rate and Rhythm: Normal rate and regular rhythm.     Heart sounds: Normal heart sounds. No murmur heard. Pulmonary:     Effort: Pulmonary effort is normal.     Breath sounds: Normal breath sounds. No rales.  Abdominal:     General: There is no distension.     Tenderness: There is abdominal tenderness (suprapubic, LLQ.). There is no right CVA tenderness, left CVA tenderness, guarding or rebound.  Musculoskeletal:     Right lower leg: No edema.     Left lower leg: No edema.  Skin:    General: Skin is warm and dry.  Neurological:     Mental Status: He is alert and oriented to person, place, and time.  Psychiatric:        Mood and Affect: Mood normal.   Results for orders placed or performed in visit on 05/25/21  POCT urinalysis dipstick  Result Value Ref Range   Color, UA yellow yellow   Clarity, UA clear clear   Glucose, UA negative negative mg/dL   Bilirubin, UA negative negative   Ketones, POC UA negative negative mg/dL   Spec Grav, UA 1.020 1.010 - 1.025   Blood, UA trace-intact (A) negative   pH, UA 5.0 5.0 - 8.0   Protein Ur, POC negative negative mg/dL   Urobilinogen, UA 0.2 0.2 or 1.0 E.U./dL   Nitrite, UA Negative Negative   Leukocytes, UA Negative Negative    51 minutes spent during visit, including chart review, prior CT report reviewed, clarification of history, controlled substance database review, counseling and assimilation of information, exam, discussion of plan, and chart completion.     Assessment & Plan:  Randall Reed. is a 50 y.o. male . Lower abdominal pain - Plan: Urine cytology ancillary only, CT Abdomen Pelvis W Contrast, CBC,  CANCELED: CBC Routine screening for STI (sexually transmitted infection) - Plan: emtricitabine-tenofovir (TRUVADA) 200-300 MG tablet, HIV Antibody (routine testing w rflx), PSA, RPR, CANCELED: RPR, CANCELED: HIV Antibody (routine testing w rflx), CANCELED: PSA History of diverticulitis - Plan: CT Abdomen Pelvis W Contrast Dysuria - Plan: POCT urinalysis dipstick  -Relapsing remitting abdominal pain, current symptoms past 6 days, suprapubic and left lower quadrant tender.  Some associated dysuria, discomfort with urination improves after bowel movement.  Previous sigmoid diverticulitis, possible recurrence.  Check CT abdomen pelvis, CBC, ER precautions, antibiotics to be decided once results reviewed as recurrent symptoms.  Trace blood on urinalysis but overall reassuring, less likely infection.  Check PSA as well but less likely prostatitis.  On pre-exposure prophylaxis for HIV - Plan: emtricitabine-tenofovir (TRUVADA) 200-300 MG tablet  -Check STI screening, continue Truvada.  Allergic rhinitis, unspecified seasonality, unspecified trigger - Plan: ipratropium (ATROVENT) 0.06 % nasal spray, montelukast (SINGULAIR) 10 MG tablet  -Well-controlled, continue follow-up with allergist, meds refilled above.  Essential hypertension - Plan: losartan (COZAAR) 25 MG tablet  -Stable, continue routine follow-up with cardiology, losartan refilled, no other med changes.  Uncomplicated asthma, unspecified asthma severity, unspecified whether persistent - Plan: montelukast (SINGULAIR) 10 MG tablet  -As above continue follow-up with allergist, Singulair refilled  Psychophysiological insomnia GAD (generalized anxiety disorder) - Plan: ALPRAZolam (XANAX) 1 MG tablet  -Stable symptoms,  with some improvement in stress recently with new job.  Alprazolam refilled, will be due for refill of Ambien next month.   Meds ordered this encounter  Medications   emtricitabine-tenofovir (TRUVADA) 200-300 MG tablet    Sig:  Take 1 tablet by mouth daily.    Dispense:  90 tablet    Refill:  1   ipratropium (ATROVENT) 0.06 % nasal spray    Sig: Place 2 sprays into the nose 2 (two) times daily.    Dispense:  15 mL    Refill:  11   losartan (COZAAR) 25 MG tablet    Sig: Take 1 tablet (25 mg total) by mouth daily.    Dispense:  90 tablet    Refill:  1   montelukast (SINGULAIR) 10 MG tablet    Sig: TAKE 1 TABLET(10 MG) BY MOUTH AT BEDTIME    Dispense:  90 tablet    Refill:  3   ALPRAZolam (XANAX) 1 MG tablet    Sig: TAKE 1 TABLET(1 MG) BY MOUTH TWICE DAILY AS NEEDED    Dispense:  60 tablet    Refill:  0   There are no Patient Instructions on file for this visit.    Signed,   [name], MD [facility] 05/25/21 1:13 PM

## 2021-05-26 ENCOUNTER — Other Ambulatory Visit: Payer: Self-pay | Admitting: Family Medicine

## 2021-05-26 ENCOUNTER — Inpatient Hospital Stay: Admission: RE | Admit: 2021-05-26 | Payer: BC Managed Care – PPO | Source: Ambulatory Visit

## 2021-05-26 ENCOUNTER — Ambulatory Visit
Admission: RE | Admit: 2021-05-26 | Discharge: 2021-05-26 | Disposition: A | Discharge status: Home / Self Care | Payer: BC Managed Care – PPO | Source: Ambulatory Visit | Attending: Family Medicine | Admitting: Family Medicine

## 2021-05-26 DIAGNOSIS — R1030 Lower abdominal pain, unspecified: Secondary | ICD-10-CM

## 2021-05-26 DIAGNOSIS — R590 Localized enlarged lymph nodes: Secondary | ICD-10-CM | POA: Diagnosis not present

## 2021-05-26 DIAGNOSIS — Z8719 Personal history of other diseases of the digestive system: Secondary | ICD-10-CM

## 2021-05-26 DIAGNOSIS — Z79899 Other long term (current) drug therapy: Secondary | ICD-10-CM

## 2021-05-26 DIAGNOSIS — K7689 Other specified diseases of liver: Secondary | ICD-10-CM | POA: Diagnosis not present

## 2021-05-26 DIAGNOSIS — Z113 Encounter for screening for infections with a predominantly sexual mode of transmission: Secondary | ICD-10-CM

## 2021-05-26 DIAGNOSIS — K5790 Diverticulosis of intestine, part unspecified, without perforation or abscess without bleeding: Secondary | ICD-10-CM | POA: Diagnosis not present

## 2021-05-26 LAB — RPR: RPR Ser Ql: NONREACTIVE

## 2021-05-26 LAB — URINE CYTOLOGY ANCILLARY ONLY
Chlamydia: NEGATIVE
Comment: NEGATIVE
Comment: NEGATIVE
Comment: NORMAL
Neisseria Gonorrhea: NEGATIVE
Trichomonas: NEGATIVE

## 2021-05-26 LAB — HIV ANTIBODY (ROUTINE TESTING W REFLEX): HIV 1&2 Ab, 4th Generation: NONREACTIVE

## 2021-05-26 MED ORDER — IOPAMIDOL (ISOVUE-300) INJECTION 61%
100.0000 mL | Freq: Once | INTRAVENOUS | Status: AC | PRN
  Administered 2021-05-26: 100 mL via INTRAVENOUS

## 2021-05-30 DIAGNOSIS — J3089 Other allergic rhinitis: Secondary | ICD-10-CM | POA: Diagnosis not present

## 2021-05-30 DIAGNOSIS — J301 Allergic rhinitis due to pollen: Secondary | ICD-10-CM | POA: Diagnosis not present

## 2021-06-09 ENCOUNTER — Ambulatory Visit: Payer: BC Managed Care – PPO | Admitting: Family Medicine

## 2021-06-13 DIAGNOSIS — J301 Allergic rhinitis due to pollen: Secondary | ICD-10-CM | POA: Diagnosis not present

## 2021-06-13 DIAGNOSIS — J3089 Other allergic rhinitis: Secondary | ICD-10-CM | POA: Diagnosis not present

## 2021-06-26 ENCOUNTER — Other Ambulatory Visit: Payer: Self-pay | Admitting: Family Medicine

## 2021-06-26 DIAGNOSIS — F411 Generalized anxiety disorder: Secondary | ICD-10-CM

## 2021-06-27 DIAGNOSIS — J3089 Other allergic rhinitis: Secondary | ICD-10-CM | POA: Diagnosis not present

## 2021-06-27 DIAGNOSIS — J301 Allergic rhinitis due to pollen: Secondary | ICD-10-CM | POA: Diagnosis not present

## 2021-06-27 NOTE — Telephone Encounter (Signed)
Patient is requesting a refill of the following medications: Requested Prescriptions   Pending Prescriptions Disp Refills   ALPRAZolam (XANAX) 1 MG tablet [Pharmacy Med Name: ALPRAZOLAM '1MG'$  TABLETS] 60 tablet     Sig: TAKE 1 TABLET(1 MG) BY MOUTH TWICE DAILY AS NEEDED    Date of patient request: 06/27/21 Last office visit: 05/25/21 Date of last refill: 05/25/21 Last refill amount: 60 Follow up time period per chart: 6 months

## 2021-06-27 NOTE — Telephone Encounter (Signed)
Controlled substance database (PDMP) reviewed. No concerns appreciated.  Last filled 05/25/2021, new refill ordered.

## 2021-07-12 ENCOUNTER — Other Ambulatory Visit: Payer: Self-pay | Admitting: Family Medicine

## 2021-07-12 DIAGNOSIS — J3089 Other allergic rhinitis: Secondary | ICD-10-CM | POA: Diagnosis not present

## 2021-07-12 DIAGNOSIS — F5104 Psychophysiologic insomnia: Secondary | ICD-10-CM

## 2021-07-12 DIAGNOSIS — J301 Allergic rhinitis due to pollen: Secondary | ICD-10-CM | POA: Diagnosis not present

## 2021-07-13 NOTE — Telephone Encounter (Signed)
LFD 03/23/21 #90 with no refills LOV 05/25/21 NOV 11/13/21

## 2021-07-14 NOTE — Telephone Encounter (Signed)
Controlled substance database (PDMP) reviewed. No concerns appreciated.  Last filled 03/24/21 - #90. Office visit 05/25/21.  Refill ordered.

## 2021-07-21 ENCOUNTER — Other Ambulatory Visit: Payer: Self-pay

## 2021-07-21 ENCOUNTER — Telehealth: Payer: BC Managed Care – PPO | Admitting: Family Medicine

## 2021-07-21 ENCOUNTER — Encounter: Payer: Self-pay | Admitting: Family Medicine

## 2021-07-21 VITALS — Temp 97.1°F

## 2021-07-21 DIAGNOSIS — J069 Acute upper respiratory infection, unspecified: Secondary | ICD-10-CM | POA: Diagnosis not present

## 2021-07-21 DIAGNOSIS — J45901 Unspecified asthma with (acute) exacerbation: Secondary | ICD-10-CM | POA: Diagnosis not present

## 2021-07-21 DIAGNOSIS — R059 Cough, unspecified: Secondary | ICD-10-CM

## 2021-07-21 DIAGNOSIS — J019 Acute sinusitis, unspecified: Secondary | ICD-10-CM

## 2021-07-21 MED ORDER — BENZONATATE 100 MG PO CAPS: 100.0000 mg | ORAL_CAPSULE | Freq: Three times a day (TID) | ORAL | 0 refills | Status: DC | PRN

## 2021-07-21 MED ORDER — AZITHROMYCIN 250 MG PO TABS: ORAL_TABLET | ORAL | 0 refills | Status: AC

## 2021-07-21 MED ORDER — PREDNISONE 20 MG PO TABS: 40.0000 mg | ORAL_TABLET | Freq: Every day | ORAL | 0 refills | Status: DC

## 2021-07-21 NOTE — Patient Instructions (Addendum)
I do recommend repeat covid test, but unlikely cause with prior negative test. If positive let me know.  Try saline nasal spray in addition to your other meds. Make sure to drink plenty of fluids. Ok to start azithromycin  for possible sinus infection.  Albuterol up to every 4-6 hours as needed. Prednisone should lessen need for albuterol next few days.  Tessalon perles if needed.  I expect you to be improving going into next week.  If any increasing shortness of breath, low oxygen saturation, confusion, or acute worsening symptoms be seen through urgent care/ER.  Let me know if there are questions and hope you feel better soon.

## 2021-07-21 NOTE — Progress Notes (Signed)
Virtual Visit via Video Note  I connected with Randall Reed. on 07/21/21 at 9:35 AM by a video enabled telemedicine application and verified that I am speaking with the correct person using two identifiers.  Patient location: home, by self My location: office, [place]   I discussed the limitations, risks, security and privacy concerns of performing an evaluation and management service by telephone and the availability of in person appointments. I also discussed with the patient that there may be a patient responsible charge related to this service. The patient expressed understanding and agreed to proceed, consent obtained  Chief complaint:  Chief Complaint  Patient presents with   Asthma    Pt reports allergy asthma issues, sinus drainage, congestion, coughing started Sunday, Negative home COVID Tuesday,       History of Present Illness: Randall Reed. is a 50 y.o. male  Asthma, cough, URI Initial symptoms started 1 week ago. Nasal congestion, runny nose, sinus drainage, congestion. Tried sudafed every 12 hours.   Cough started 5 days ago. Green nasal discharge 5 days ago, now more of a runny nose, but persistent discolored nasal discharge. coughing spells at times. Cough is worst symptom, slight blood tinge to sputum after forceful cough this am only. Has used albuterol past 5 days - 6 times per day. Has remained on symbicort BID (prior to illness as well). Wheezing this am. No fever. Pulse ox last night 93-94%. 94-95% this am. Dyspnea with coughing fit only. Has taken prednisone in past, tolerated. No recent use.  Having to see elderly grandmother as closest relative.   History of asthma, treated by ENT and allergist in [place] previously.  Has used Atrovent nasal spray,flonase,  Singulair, Allegra and Sudafed in the past.  Symbicort as needed for asthma.  Allergy injections in the past, Atrovent as a rescue inhaler, and prior to workouts. COVID vaccination initially  in March and April 2021, booster in November 2021 and again in March of this year. COVID testing negative 3 days ago.   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   rotator cuff tear 10/17/2013   Hypogonadism male 09/04/2013   ED (erectile dysfunction) 07/30/2013   Palpitations 02/21/2012   BMI 33.0-33.9,adult 02/04/2012   Allergic rhinitis 02/04/2012   GERD (gastroesophageal reflux disease) 02/04/2012   Migraine 02/04/2012   GAD (generalized anxiety disorder) 02/04/2012   Insomnia 02/04/2012   Essential hypertension 04/13/2009   CHEST PAIN-UNSPECIFIED 04/13/2009   Past Medical History:  Diagnosis Date   Anxiety    Arthritis    Asthma    Depression    GERD (gastroesophageal reflux disease)    Hyperlipidemia    [employer] 12/05/2020   Seasonal allergic reaction    Past Surgical History:  Procedure Laterality Date   LIPOMA EXCISION N/A 10/30/2018   Procedure: EXCISION OF MULTIPLE SUBCUTANEOUS LIPOMAS ON TORSO;  Surgeon: Donnie Mesa, MD;  Location: [facility];  Service: General;  Laterality: N/A;   none     none     Allergies  Allergen Reactions   Desloratadine Other (See Comments)    CLARINEX-"severe headache"   Loratadine Other (See Comments)    "severe headache"   Other    Levbid [Hyoscyamine Sulfate] Rash   Telithromycin Rash   Prior to Admission  medications   Medication Sig Start Date End Date Taking? Authorizing Provider  acetic acid-hydrocortisone (VOSOL-HC) otic solution Place 3 drops into both ears 3 (three) times daily. Use as needed to prevent swimmer's ear 01/10/15  Yes Leandrew Koyanagi, MD  albuterol (VENTOLIN HFA) 108 (90 Base) MCG/ACT inhaler INHALE 2 PUFFS INTO THE LUNGS EVERY 6 HOURS AS NEEDED FOR  WHEEZING OR SHORTNESS OF BREATH 07/19/20  Yes [name], MD  [facility] ALLERGY 180 MG tablet Take 180 mg by mouth daily. 07/12/20  Yes [provider]  ALPRAZolam [name]) 1 MG tablet TAKE 1 TABLET(1 MG) BY MOUTH TWICE DAILY AS NEEDED 06/27/21  Yes [name], MD  aspirin EC 81 MG tablet Take 1 tablet (81 mg total) by mouth daily. 03/07/15  Yes Sherren Mocha, MD  cyclobenzaprine (FLEXERIL) 10 MG tablet Take 10 mg by mouth 3 (three) times daily as needed for muscle spasms.  04/08/20  Yes [provider]  Diclofenac Sodium (PENNSAID) 2 % SOLN Apply topically as needed.  04/08/20  Yes [provider]  dicyclomine (BENTYL) 20 MG tablet Take 20 mg by mouth 2 (two) times a day.   Yes [provider]  diltiazem (CARDIZEM CD) 180 MG 24 hr capsule TAKE 1 CAPSULE(180 MG) BY MOUTH DAILY 03/23/21  Yes Sherren Mocha, MD  emtricitabine-tenofovir (TRUVADA) 200-300 MG tablet Take 1 tablet by mouth daily. 05/25/21  Yes [name], MD  famotidine (PEPCID) 40 MG tablet Take 40 mg by mouth at bedtime.  02/18/20  Yes [provider]  fluticasone (FLONASE) 50 MCG/ACT nasal spray Place 2 sprays into both nostrils daily. 07/12/20  Yes [provider]  hydrocortisone (ANUSOL-HC) 2.5 % rectal cream APPLY RECTALLY TWICE DAILY 06/30/20  Yes [name], MD  ipratropium (ATROVENT) 0.06 % nasal spray Place 2 sprays into the nose 2 (two) times daily. 05/25/21  Yes [name], MD  losartan (COZAAR) 25 MG tablet Take 1 tablet (25 mg total) by mouth daily. 05/25/21  Yes [name], MD  metoprolol succinate (TOPROL-XL) 25 MG 24 hr tablet TAKE 1 TABLET(25 MG) BY MOUTH AT BEDTIME 05/24/21  Yes Sherren Mocha, MD  montelukast (SINGULAIR) 10 MG tablet TAKE 1 TABLET(10 MG) BY MOUTH AT BEDTIME 05/25/21  Yes [name], MD  mupirocin ointment (BACTROBAN) 2 % SMARTSIG:1 Application Topical 2-3 Times Daily 08/23/20  Yes [provider]  ondansetron  (ZOFRAN-ODT) 4 MG disintegrating tablet Take 4 mg by mouth every 8 (eight) hours as needed for nausea or vomiting.   Yes [provider]  pantoprazole (PROTONIX) 40 MG tablet Take 40 mg by mouth 2 (two) times daily.  04/08/20  Yes [provider]  Potassium Chloride ER 20 MEQ TBCR Take 20 mEq by mouth daily. 08/01/20 07/27/21 Yes Sherren Mocha, MD  pramoxine-hydrocortisone (PROCTOCREAM-HC) 1-1 % rectal cream APPLY TO AFFECTED AREA THREE TIMES DAILY   Yes [provider]  rizatriptan (MAXALT) 10 MG tablet Take 1 tablet (10 mg total) by mouth as needed for migraine. May repeat in 2 hours if needed 12/22/20  Yes [name], MD  rosuvastatin (CRESTOR) 10 MG tablet TAKE 1 TABLET(10 MG) BY MOUTH DAILY 03/23/21  Yes Sherren Mocha, MD  sildenafil (VIAGRA) 100 MG tablet TAKE 1/2 TO 1 TABLET BY MOUTH ONCE DAILY AS NEEDED PRIOR TO SEXUAL ACTIVITY 03/23/21  Yes Sherren Mocha, MD  SUMAtriptan (IMITREX) 100 MG tablet Take 100 mg by mouth (1 tablet) at for signs of migraine headache. May  take 1 more dose 2 hours later. Do not exceed 200 mg in 24 hours. 12/20/20  Yes [provider]  SYMBICORT 160-4.5 MCG/ACT inhaler Inhale 2 puffs into the lungs in the morning and at bedtime.  02/28/20  Yes [provider]  testosterone cypionate (DEPOTESTOTERONE CYPIONATE) 100 MG/ML injection Inject 0.75 mg into the muscle every 7 (seven) days. For IM use only   Yes [provider]  zolpidem (AMBIEN) 10 MG tablet TAKE 1 TABLET(10 MG) BY MOUTH AT BEDTIME 07/14/21  Yes [name], MD  Fexofenadine HCl (ALLEGRA PO) Take 1 tablet by mouth daily.     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)  Tobacco Use   Smoking  status: Never   Smokeless tobacco: Never  Vaping Use   Vaping Use: Never used  Substance and Sexual Activity   Alcohol use: Yes    Alcohol/week: 3.0 standard drinks    Types: 3 Standard drinks or equivalent per week   Drug use: No   Sexual activity: Yes  Other Topics Concern   Not on file  Social History Narrative   Not on file   Social Determinants of Health   Financial Resource Strain: Not on file  Food Insecurity: Not on file  Transportation Needs: Not on file  Physical Activity: Not on file  Stress: Not on file  Social Connections: Not on file  Intimate Partner Violence: Not on file    Observations/Objective: Vitals:   07/21/21 0847  Temp: (!) 97.1 F (36.2 C)  SpO2: 94%  Nontoxic appearance, speaking in full sentences without respiratory distress.  No audible wheeze.  Minimal cough during visit.  Appropriate coherent responses.  All questions were answered.   Assessment and Plan: Upper respiratory tract infection, unspecified type  Cough - Plan: benzonatate (TESSALON) 100 MG capsule, predniSONE (DELTASONE) 20 MG tablet  Exacerbation of asthma, unspecified asthma severity, unspecified whether persistent - Plan: predniSONE (DELTASONE) 20 MG tablet  Acute sinusitis, recurrence not specified, unspecified location - Plan: predniSONE (DELTASONE) 20 MG tablet, azithromycin (ZITHROMAX) 250 MG tablet  Suspected initial viral illness with possible secondary sinusitis.  Unlikely COVID-19 infection with negative testing approximately 4 days into illness.  Repeat testing planned today.  Call if positive.  Outside of window for antiviral.  Asthma exacerbation with frequent need for albuterol.  -Continue home meds including steroid nasal spray, add saline nasal spray, continue Symbicort, albuterol if needed, start prednisone 40 mg daily x5 days, potential side effects and risk discussed.  -Start azithromycin, potential side effects discussed, he has taken previously without  reaction.  -Tessalon Perles 100 to 200 mg 3 times daily as needed.  -Rest, fluids, ER/urgent care precautions given.  Follow Up Instructions: As needed with urgent care/ER precautions   I discussed the assessment and treatment plan with the patient. The patient was provided an opportunity to ask questions and all were answered. The patient agreed with the plan and demonstrated an understanding of the instructions.   The patient was advised to call back or seek an in-person evaluation if the symptoms worsen or if the condition fails to improve as anticipated.  I provided 25 minutes of non-face-to-face time during this encounter.   Dellis Filbert  Valora Piccolo, MD

## 2021-07-26 DIAGNOSIS — J301 Allergic rhinitis due to pollen: Secondary | ICD-10-CM | POA: Diagnosis not present

## 2021-07-26 DIAGNOSIS — J3089 Other allergic rhinitis: Secondary | ICD-10-CM | POA: Diagnosis not present

## 2021-07-27 ENCOUNTER — Other Ambulatory Visit: Payer: Self-pay

## 2021-07-27 ENCOUNTER — Encounter: Payer: Self-pay | Admitting: Family Medicine

## 2021-07-27 ENCOUNTER — Ambulatory Visit: Payer: BC Managed Care – PPO | Admitting: Family Medicine

## 2021-07-27 ENCOUNTER — Other Ambulatory Visit (HOSPITAL_COMMUNITY)
Admission: RE | Admit: 2021-07-27 | Discharge: 2021-07-27 | Disposition: A | Discharge status: Home / Self Care | Payer: BC Managed Care – PPO | Source: Ambulatory Visit | Attending: Family Medicine | Admitting: Family Medicine

## 2021-07-27 VITALS — BP 134/70 | HR 94 | Temp 98.2°F | Resp 16 | Ht 71.0 in | Wt 250.4 lb

## 2021-07-27 DIAGNOSIS — R319 Hematuria, unspecified: Secondary | ICD-10-CM | POA: Diagnosis not present

## 2021-07-27 DIAGNOSIS — R300 Dysuria: Secondary | ICD-10-CM | POA: Insufficient documentation

## 2021-07-27 DIAGNOSIS — R1030 Lower abdominal pain, unspecified: Secondary | ICD-10-CM | POA: Diagnosis not present

## 2021-07-27 DIAGNOSIS — N390 Urinary tract infection, site not specified: Secondary | ICD-10-CM | POA: Insufficient documentation

## 2021-07-27 DIAGNOSIS — Z8719 Personal history of other diseases of the digestive system: Secondary | ICD-10-CM

## 2021-07-27 LAB — CBC
HCT: 49.1 % (ref 39.0–52.0)
Hemoglobin: 16.8 g/dL (ref 13.0–17.0)
MCHC: 34.2 g/dL (ref 30.0–36.0)
MCV: 89.6 fl (ref 78.0–100.0)
Platelets: 219 10*3/uL (ref 150.0–400.0)
RBC: 5.48 Mil/uL (ref 4.22–5.81)
RDW: 13.7 % (ref 11.5–15.5)
WBC: 13.9 10*3/uL — ABNORMAL HIGH (ref 4.0–10.5)

## 2021-07-27 LAB — POCT URINALYSIS DIP (MANUAL ENTRY)
Bilirubin, UA: NEGATIVE
Glucose, UA: NEGATIVE mg/dL
Nitrite, UA: NEGATIVE
Protein Ur, POC: NEGATIVE mg/dL
Spec Grav, UA: 1.02 (ref 1.010–1.025)
Urobilinogen, UA: NEGATIVE E.U./dL — AB
pH, UA: 6 (ref 5.0–8.0)

## 2021-07-27 LAB — PSA: PSA: 0.72 ng/mL (ref 0.10–4.00)

## 2021-07-27 MED ORDER — LEVOFLOXACIN 500 MG PO TABS: 500.0000 mg | ORAL_TABLET | Freq: Every day | ORAL | 0 refills | Status: DC

## 2021-07-27 NOTE — Progress Notes (Signed)
Subjective:  Patient ID: Randall Reed., male    DOB: 1971/01/01  Age: 50 y.o. MRN: [id number]  CC:  Chief Complaint  Patient presents with   Dysuria    Pt reports lower abdominal pain, difficulty urinating, constipation as well, starting Sunday, tried colace Monday helped some, has become painful again, having lower back pain     HPI [name]. presents for   Abdominal pain Symptoms as above. Episodic lower abdominal discomfort every few weeks. Discussed in July.  He does have a history of diverticulosis with prior diverticulitis.  Most recent CT abdomen pelvis on 05/26/2021 with cluster of prominent nodes in the sigmoid mesentery possibly related to diverticulitis but no acute inflammation seen at the multiple left colonic diverticula.  Colonoscopy in 2019, [name].  Few polyps. Recent Zpak and prednisone for respiratory infection - finished few days ago.   New symptoms 4 days ago - lower abd pain below umbilicus. Had difficulty having a bowel movement. Tried Colace - able to have BM. Soft stool. Some discomfort with BM. No blood, brown stool, then loose stool, watery stool at end.  No fever. Some nausea 2 days ago, pain started again 2 nights ago - lower mid abdomen, radiates to left. Some back pain past 2 days. No hx of kidney stones.  Pain with urination 2 nights ago. Some increased frequency, urgency, incomplete emptying. No hematuria.  Some increased flatus. Min relief with milk of magnesia.  Small hard stool yesterday.  No diarrhea other than after colace, then daily miralax.  Has bentyl for IBS - takes BID - will try tid for now.   Painful urination. No penile discharge. Pain in left testicle today.  Heating pad for back pain - helped No recent new sexual contacts.  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   rotator cuff tear 10/17/2013   Hypogonadism male 09/04/2013   ED (erectile dysfunction) 07/30/2013   Palpitations 02/21/2012   BMI 33.0-33.9,adult 02/04/2012   Allergic rhinitis 02/04/2012   GERD (gastroesophageal reflux disease) 02/04/2012   Migraine 02/04/2012   GAD (generalized anxiety disorder) 02/04/2012   Insomnia 02/04/2012   Essential hypertension 04/13/2009   CHEST PAIN-UNSPECIFIED 04/13/2009   Past Medical History:  Diagnosis Date   Anxiety    Arthritis    Asthma    Depression    GERD (gastroesophageal reflux disease)    Hyperlipidemia    [employer]reesia 12/05/2020   Seasonal allergic reaction    Past Surgical History:  Procedure Laterality Date   LIPOMA EXCISION N/A 10/30/2018   Procedure: EXCISION OF MULTIPLE SUBCUTANEOUS LIPOMAS ON TORSO;  Surgeon: Donnie Mesa, MD;  Location: [place];  Service: General;  Laterality: N/A;   none     none     Allergies  Allergen Reactions   Desloratadine Other (See Comments)    CLARINEX-"severe headache"   Loratadine Other (See Comments)    "severe headache"   Other    Levbid [Hyoscyamine Sulfate] Rash   Telithromycin Rash   Prior to Admission medications   Medication Sig Start Date End Date Taking? Authorizing Provider  acetic acid-hydrocortisone (VOSOL-HC)  otic solution Place 3 drops into both ears 3 (three) times daily. Use as needed to prevent swimmer's ear 01/10/15  Yes Leandrew Koyanagi, MD  albuterol (VENTOLIN HFA) 108 (90 Base) MCG/ACT inhaler INHALE 2 PUFFS INTO THE LUNGS EVERY 6 HOURS AS NEEDED FOR WHEEZING OR SHORTNESS OF BREATH 07/19/20  Yes [name], MD  [facility] ALLERGY 180 MG tablet Take 180 mg by mouth daily. 07/12/20  Yes [provider]  ALPRAZolam [name]) 1 MG tablet TAKE 1  TABLET(1 MG) BY MOUTH TWICE DAILY AS NEEDED 06/27/21  Yes [name], MD  aspirin EC 81 MG tablet Take 1 tablet (81 mg total) by mouth daily. 03/07/15  Yes Sherren Mocha, MD  benzonatate (TESSALON) 100 MG capsule Take 1-2 capsules (100-200 mg total) by mouth 3 (three) times daily as needed for cough. 07/21/21  Yes [name], MD  cyclobenzaprine (FLEXERIL) 10 MG tablet Take 10 mg by mouth 3 (three) times daily as needed for muscle spasms.  04/08/20  Yes [provider]  Diclofenac Sodium (PENNSAID) 2 % SOLN Apply topically as needed.  04/08/20  Yes [provider]  dicyclomine (BENTYL) 20 MG tablet Take 20 mg by mouth 2 (two) times a day.   Yes [provider]  diltiazem (CARDIZEM CD) 180 MG 24 hr capsule TAKE 1 CAPSULE(180 MG) BY MOUTH DAILY 03/23/21  Yes Sherren Mocha, MD  emtricitabine-tenofovir (TRUVADA) 200-300 MG tablet Take 1 tablet by mouth daily. 05/25/21  Yes [name], MD  famotidine (PEPCID) 40 MG tablet Take 40 mg by mouth at bedtime.  02/18/20  Yes [provider]  fluticasone (FLONASE) 50 MCG/ACT nasal spray Place 2 sprays into both nostrils daily. 07/12/20  Yes [provider]  hydrocortisone (ANUSOL-HC) 2.5 % rectal cream APPLY RECTALLY TWICE DAILY 06/30/20  Yes [name], MD  ipratropium (ATROVENT) 0.06 % nasal spray Place 2 sprays into the nose 2 (two) times daily. 05/25/21  Yes [name], MD  losartan (COZAAR) 25 MG tablet Take 1 tablet (25 mg total) by mouth daily. 05/25/21  Yes [name], MD  metoprolol succinate (TOPROL-XL) 25 MG 24 hr tablet TAKE 1 TABLET(25 MG) BY MOUTH AT BEDTIME 05/24/21  Yes Sherren Mocha, MD  montelukast (SINGULAIR) 10 MG tablet TAKE 1 TABLET(10 MG) BY MOUTH AT BEDTIME 05/25/21  Yes [name], MD  mupirocin ointment (BACTROBAN) 2 % SMARTSIG:1 Application Topical 2-3 Times Daily 08/23/20  Yes [provider]  ondansetron (ZOFRAN-ODT) 4 MG disintegrating tablet  Take 4 mg by mouth every 8 (eight) hours as needed for nausea or vomiting.   Yes [provider]  pantoprazole (PROTONIX) 40 MG tablet Take 40 mg by mouth 2 (two) times daily.  04/08/20  Yes [provider]  Potassium Chloride ER 20 MEQ TBCR Take 20 mEq by mouth daily. 08/01/20 07/27/21 Yes Sherren Mocha, MD  pramoxine-hydrocortisone (PROCTOCREAM-HC) 1-1 % rectal cream APPLY TO AFFECTED AREA THREE TIMES DAILY   Yes [provider]  predniSONE (DELTASONE) 20 MG tablet Take 2 tablets (40 mg total) by mouth daily with breakfast. 07/21/21  Yes [name], MD  rizatriptan (MAXALT) 10 MG tablet Take 1 tablet (10 mg total) by mouth as needed for migraine. May repeat in 2 hours if needed 12/22/20  Yes [name], MD  rosuvastatin (CRESTOR) 10 MG tablet TAKE 1 TABLET(10 MG) BY MOUTH DAILY 03/23/21  Yes Sherren Mocha, MD  sildenafil (VIAGRA) 100 MG tablet TAKE 1/2 TO 1  TABLET BY MOUTH ONCE DAILY AS NEEDED PRIOR TO SEXUAL ACTIVITY 03/23/21  Yes Sherren Mocha, MD  SUMAtriptan (IMITREX) 100 MG tablet Take 100 mg by mouth (1 tablet) at for signs of migraine headache. May take 1 more dose 2 hours later. Do not exceed 200 mg in 24 hours. 12/20/20  Yes [provider]  SYMBICORT 160-4.5 MCG/ACT inhaler Inhale 2 puffs into the lungs in the morning and at bedtime.  02/28/20  Yes [provider]  testosterone cypionate (DEPOTESTOTERONE CYPIONATE) 100 MG/ML injection Inject 0.75 mg into the muscle every 7 (seven) days. For IM use only   Yes [provider]  zolpidem (AMBIEN) 10 MG tablet TAKE 1 TABLET(10 MG) BY MOUTH AT BEDTIME 07/14/21  Yes [name], MD  Fexofenadine HCl (ALLEGRA PO) Take 1 tablet by mouth daily.     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)  Tobacco Use   Smoking status: Never   Smokeless tobacco: Never  Vaping Use   Vaping Use: Never used  Substance and Sexual Activity   Alcohol use: Yes    Alcohol/week: 3.0 standard drinks    Types: 3 Standard drinks or equivalent per week   Drug use: No   Sexual activity: Yes  Other Topics Concern   Not on file  Social History Narrative   Not on file   Social Determinants of Health   Financial Resource Strain: Not on file  Food Insecurity: Not on file  Transportation Needs: Not on file  Physical Activity: Not on file  Stress: Not on file  Social Connections: Not on file  Intimate Partner Violence: Not on file    Review of Systems Per HPI  Objective:   Vitals:   07/27/21 1128  BP: 134/70  Pulse: 94  Resp: 16  Temp: 98.2 F (36.8 C)  TempSrc: Temporal  SpO2: 95%  Weight: 250 lb 6.4 oz (113.6 kg)  Height: 5\' 11"  (1.803 m)   Physical Exam Vitals reviewed.  Constitutional:      Appearance: He is well-developed.  HENT:     Head: Normocephalic and atraumatic.  Neck:     Vascular: No carotid bruit or JVD.  Cardiovascular:     Rate and Rhythm: Normal rate and regular rhythm.     Heart sounds: Normal heart sounds. No murmur heard. Pulmonary:     Effort: Pulmonary effort is normal.     Breath sounds: Normal breath sounds. No rales.  Abdominal:     Comments: Tender to palpation suprapubic, slight left lower quadrant but primarily midline.  No rebound/guarding.  No apparent distention.  Upper abdomen nontender.  Genitourinary:    Comments: Minimal discomfort left epididymis, right epididymis nontender, no apparent swelling or appreciated mass/nodules.  No penile discharge or rash.  No appreciated inguinal lymphadenopathy. Musculoskeletal:     Right lower leg: No edema.     Left lower leg: No edema.  Skin:    General: Skin is warm and dry.  Neurological:     Mental Status: He is alert and  oriented to person, place, and time.  Psychiatric:        Mood and Affect: Mood normal.    Results for orders placed or  performed in visit on 07/27/21  POCT urinalysis dipstick  Result Value Ref Range   Color, UA straw (A) yellow   Clarity, UA clear clear   Glucose, UA negative negative mg/dL   Bilirubin, UA negative negative   Ketones, POC UA trace (5) (A) negative mg/dL   Spec Grav, UA 1.020 1.010 - 1.025   Blood, UA trace-intact (A) negative   pH, UA 6.0 5.0 - 8.0   Protein Ur, POC negative negative mg/dL   Urobilinogen, UA negative (A) 0.2 or 1.0 E.U./dL   Nitrite, UA Negative Negative   Leukocytes, UA Trace (A) Negative     Assessment & Plan:  Beatrice Sehgal. is a 50 y.o. male . Dysuria - Plan: POCT urinalysis dipstick, Urine cytology ancillary only  Lower abdominal pain - Plan: Urine cytology ancillary only, CBC  Urinary tract infection with hematuria, site unspecified - Plan: Urine cytology ancillary only, PSA, Urine Culture, levofloxacin (LEVAQUIN) 500 MG tablet  History of diverticulosis - Plan: CBC  Trace blood, trace LE on urinalysis and with significant urinary symptoms as above suspected prostatitis or at least urinary tract infection of other site with hematuria.  He does have a history of diverticulosis, diverticulitis possible but less likely with other urinary symptoms and findings.  Minimal discomfort of left epididymis, possible early epididymitis but does not appear to be primary area of concern.  Some change in bowels as above but no true diarrhea.  Possible constipation.    -We will initially check PSA, urine culture, treat with Levaquin for possible prostatitis.  Potential risks of this medications discussed including tendinopathy risks, C. difficile infection risk with recent antibiotics.  -Check CBC.  Follow-up in 4 days but ER precautions given if any worsening of symptoms over the weekend.  Hold on imaging for now.  -Continue Bentyl for IBS symptoms  and possible spasms, MiraLAX if needed for hard stools/constipation.  ER precautions. Meds ordered this encounter  Medications   levofloxacin (LEVAQUIN) 500 MG tablet    Sig: Take 1 tablet (500 mg total) by mouth daily for 7 days.    Dispense:  10 tablet    Refill:  0   Patient Instructions  Based on your symptoms and test today I suspect you probably have a urinary tract infection, possible prostate infection.  Start Levaquin.  As we discussed that medicine does have potential risks including tendon inflammation or rupture but that is rare.  He can also increase risk for diarrhea or C. difficile infection especially with your recent antibiotic.  If you have increasing diarrhea, especially more than 4-5 times per day, increase in abdominal pain, fevers, or other worsening symptoms be seen in the emergency room over the weekend.  Otherwise follow-up with me on Monday as planned.  Okay to stay on MiraLAX daily to help with bowel movements but stop if any diarrhea.  Bentyl if needed for spasms. Return to the clinic or go to the nearest emergency room if any of your symptoms worsen or new symptoms occur.  Urinary Tract Infection, Adult A urinary tract infection (UTI) is an infection of any part of the urinary tract. The urinary tract includes the kidneys, ureters, bladder, and urethra. These organs make, store, and get rid of urine in the body. An upper UTI affects the ureters and kidneys. A lower UTI affects the bladder and urethra. What are the causes? Most urinary tract infections are caused by bacteria in your genital area around your urethra, where urine leaves your  body. These bacteria grow and cause inflammation of your urinary tract. What increases the risk? You are more likely to develop this condition if: You have a urinary catheter that stays in place. You are not able to control when you urinate or have a bowel movement (incontinence). You are male and you: Use a spermicide or  diaphragm for birth control. Have low estrogen levels. Are pregnant. You have certain genes that increase your risk. You are sexually active. You take antibiotic medicines. You have a condition that causes your flow of urine to slow down, such as: An enlarged prostate, if you are male. Blockage in your urethra. A kidney stone. A nerve condition that affects your bladder control (neurogenic bladder). Not getting enough to drink, or not urinating often. You have certain medical conditions, such as: Diabetes. A weak disease-fighting system (immunesystem). Sickle cell disease. Gout. Spinal cord injury. What are the signs or symptoms? Symptoms of this condition include: Needing to urinate right away (urgency). Frequent urination. This may include small amounts of urine each time you urinate. Pain or burning with urination. Blood in the urine. Urine that smells bad or unusual. Trouble urinating. Cloudy urine. Vaginal discharge, if you are male. Pain in the abdomen or the lower back. You may also have: Vomiting or a decreased appetite. Confusion. Irritability or tiredness. A fever or chills. Diarrhea. The first symptom in older adults may be confusion. In some cases, they may not have any symptoms until the infection has worsened. How is this diagnosed? This condition is diagnosed based on your medical history and a physical exam. You may also have other tests, including: Urine tests. Blood tests. Tests for STIs (sexually transmitted infections). If you have had more than one UTI, a cystoscopy or imaging studies may be done to determine the cause of the infections. How is this treated? Treatment for this condition includes: Antibiotic medicine. Over-the-counter medicines to treat discomfort. Drinking enough water to stay hydrated. If you have frequent infections or have other conditions such as a kidney stone, you may need to see a health care provider who specializes in the  urinary tract (urologist). In rare cases, urinary tract infections can cause sepsis. Sepsis is a life-threatening condition that occurs when the body responds to an infection. Sepsis is treated in the hospital with IV antibiotics, fluids, and other medicines. Follow these instructions at home: Medicines Take over-the-counter and prescription medicines only as told by your health care provider. If you were prescribed an antibiotic medicine, take it as told by your health care provider. Do not stop using the antibiotic even if you start to feel better. General instructions Make sure you: Empty your bladder often and completely. Do not hold urine for long periods of time. Empty your bladder after sex. Wipe from front to back after urinating or having a bowel movement if you are male. Use each tissue only one time when you wipe. Drink enough fluid to keep your urine pale yellow. Keep all follow-up visits. This is important. Contact a health care provider if: Your symptoms do not get better after 1-2 days. Your symptoms go away and then return. Get help right away if: You have severe pain in your back or your lower abdomen. You have a fever or chills. You have nausea or vomiting. Summary A urinary tract infection (UTI) is an infection of any part of the urinary tract, which includes the kidneys, ureters, bladder, and urethra. Most urinary tract infections are caused by bacteria in your genital  area. Treatment for this condition often includes antibiotic medicines. If you were prescribed an antibiotic medicine, take it as told by your health care provider. Do not stop using the antibiotic even if you start to feel better. Keep all follow-up visits. This is important. This information is not intended to replace advice given to you by your health care provider. Make sure you discuss any questions you have with your health care provider. Document Revised: 06/03/2020 Document Reviewed:  06/03/2020 Elsevier Patient Education  2022 [place],   [name], MD [facility] 07/27/21 1:07 PM

## 2021-07-27 NOTE — Patient Instructions (Signed)
Based on your symptoms and test today I suspect you probably have a urinary tract infection, possible prostate infection.  Start Levaquin.  As we discussed that medicine does have potential risks including tendon inflammation or rupture but that is rare.  He can also increase risk for diarrhea or C. difficile infection especially with your recent antibiotic.  If you have increasing diarrhea, especially more than 4-5 times per day, increase in abdominal pain, fevers, or other worsening symptoms be seen in the emergency room over the weekend.  Otherwise follow-up with me on Monday as planned.  Okay to stay on MiraLAX daily to help with bowel movements but stop if any diarrhea.  Bentyl if needed for spasms. Return to the clinic or go to the nearest emergency room if any of your symptoms worsen or new symptoms occur.  Urinary Tract Infection, Adult A urinary tract infection (UTI) is an infection of any part of the urinary tract. The urinary tract includes the kidneys, ureters, bladder, and urethra. These organs make, store, and get rid of urine in the body. An upper UTI affects the ureters and kidneys. A lower UTI affects the bladder and urethra. What are the causes? Most urinary tract infections are caused by bacteria in your genital area around your urethra, where urine leaves your body. These bacteria grow and cause inflammation of your urinary tract. What increases the risk? You are more likely to develop this condition if: You have a urinary catheter that stays in place. You are not able to control when you urinate or have a bowel movement (incontinence). You are male and you: Use a spermicide or diaphragm for birth control. Have low estrogen levels. Are pregnant. You have certain genes that increase your risk. You are sexually active. You take antibiotic medicines. You have a condition that causes your flow of urine to slow down, such as: An enlarged prostate, if you are male. Blockage in  your urethra. A kidney stone. A nerve condition that affects your bladder control (neurogenic bladder). Not getting enough to drink, or not urinating often. You have certain medical conditions, such as: Diabetes. A weak disease-fighting system (immunesystem). Sickle cell disease. Gout. Spinal cord injury. What are the signs or symptoms? Symptoms of this condition include: Needing to urinate right away (urgency). Frequent urination. This may include small amounts of urine each time you urinate. Pain or burning with urination. Blood in the urine. Urine that smells bad or unusual. Trouble urinating. Cloudy urine. Vaginal discharge, if you are male. Pain in the abdomen or the lower back. You may also have: Vomiting or a decreased appetite. Confusion. Irritability or tiredness. A fever or chills. Diarrhea. The first symptom in older adults may be confusion. In some cases, they may not have any symptoms until the infection has worsened. How is this diagnosed? This condition is diagnosed based on your medical history and a physical exam. You may also have other tests, including: Urine tests. Blood tests. Tests for STIs (sexually transmitted infections). If you have had more than one UTI, a cystoscopy or imaging studies may be done to determine the cause of the infections. How is this treated? Treatment for this condition includes: Antibiotic medicine. Over-the-counter medicines to treat discomfort. Drinking enough water to stay hydrated. If you have frequent infections or have other conditions such as a kidney stone, you may need to see a health care provider who specializes in the urinary tract (urologist). In rare cases, urinary tract infections can cause sepsis. Sepsis is a  life-threatening condition that occurs when the body responds to an infection. Sepsis is treated in the hospital with IV antibiotics, fluids, and other medicines. Follow these instructions at  home: Medicines Take over-the-counter and prescription medicines only as told by your health care provider. If you were prescribed an antibiotic medicine, take it as told by your health care provider. Do not stop using the antibiotic even if you start to feel better. General instructions Make sure you: Empty your bladder often and completely. Do not hold urine for long periods of time. Empty your bladder after sex. Wipe from front to back after urinating or having a bowel movement if you are male. Use each tissue only one time when you wipe. Drink enough fluid to keep your urine pale yellow. Keep all follow-up visits. This is important. Contact a health care provider if: Your symptoms do not get better after 1-2 days. Your symptoms go away and then return. Get help right away if: You have severe pain in your back or your lower abdomen. You have a fever or chills. You have nausea or vomiting. Summary A urinary tract infection (UTI) is an infection of any part of the urinary tract, which includes the kidneys, ureters, bladder, and urethra. Most urinary tract infections are caused by bacteria in your genital area. Treatment for this condition often includes antibiotic medicines. If you were prescribed an antibiotic medicine, take it as told by your health care provider. Do not stop using the antibiotic even if you start to feel better. Keep all follow-up visits. This is important. This information is not intended to replace advice given to you by your health care provider. Make sure you discuss any questions you have with your health care provider. Document Revised: 06/03/2020 Document Reviewed: 06/03/2020 Elsevier Patient Education  [place].

## 2021-07-28 LAB — URINE CULTURE
MICRO NUMBER:: 12410218
Result:: NO GROWTH
SPECIMEN QUALITY:: ADEQUATE

## 2021-07-28 MED ORDER — TAMSULOSIN HCL 0.4 MG PO CAPS: 0.4000 mg | ORAL_CAPSULE | Freq: Every day | ORAL | 0 refills | Status: DC | PRN

## 2021-07-31 LAB — URINE CYTOLOGY ANCILLARY ONLY
Chlamydia: NEGATIVE
Comment: NEGATIVE
Comment: NEGATIVE
Comment: NORMAL
Neisseria Gonorrhea: NEGATIVE
Trichomonas: NEGATIVE

## 2021-08-01 ENCOUNTER — Other Ambulatory Visit: Payer: Self-pay | Admitting: Family Medicine

## 2021-08-01 DIAGNOSIS — R1030 Lower abdominal pain, unspecified: Secondary | ICD-10-CM

## 2021-08-01 DIAGNOSIS — Z8719 Personal history of other diseases of the digestive system: Secondary | ICD-10-CM

## 2021-08-01 MED ORDER — AMOXICILLIN-POT CLAVULANATE 875-125 MG PO TABS
1.0000 | ORAL_TABLET | Freq: Two times a day (BID) | ORAL | 0 refills | Status: DC
Start: 2021-08-01 — End: 2021-11-13

## 2021-08-01 NOTE — Progress Notes (Signed)
See lab notes. Will stop levaquin, start Augmentin. Update on symptoms next few days.

## 2021-08-09 DIAGNOSIS — J301 Allergic rhinitis due to pollen: Secondary | ICD-10-CM | POA: Diagnosis not present

## 2021-08-09 DIAGNOSIS — J3089 Other allergic rhinitis: Secondary | ICD-10-CM | POA: Diagnosis not present

## 2021-08-17 ENCOUNTER — Encounter: Payer: Self-pay | Admitting: Family Medicine

## 2021-08-17 ENCOUNTER — Other Ambulatory Visit: Payer: Self-pay | Admitting: Family Medicine

## 2021-08-17 MED ORDER — TAMSULOSIN HCL 0.4 MG PO CAPS: 0.4000 mg | ORAL_CAPSULE | Freq: Every day | ORAL | 0 refills | Status: DC | PRN

## 2021-08-18 ENCOUNTER — Other Ambulatory Visit: Payer: Self-pay

## 2021-08-18 ENCOUNTER — Telehealth: Payer: Self-pay

## 2021-08-18 ENCOUNTER — Other Ambulatory Visit: Payer: Self-pay | Admitting: Family Medicine

## 2021-08-18 DIAGNOSIS — F411 Generalized anxiety disorder: Secondary | ICD-10-CM

## 2021-08-18 DIAGNOSIS — Z113 Encounter for screening for infections with a predominantly sexual mode of transmission: Secondary | ICD-10-CM

## 2021-08-18 DIAGNOSIS — Z79899 Other long term (current) drug therapy: Secondary | ICD-10-CM

## 2021-08-18 MED ORDER — EMTRICITABINE-TENOFOVIR DF 200-300 MG PO TABS
1.0000 | ORAL_TABLET | Freq: Every day | ORAL | 1 refills | Status: DC
Start: 2021-08-18 — End: 2021-11-13

## 2021-08-18 NOTE — Telephone Encounter (Signed)
Rx has been filled and sent to patient preferred pharmacy.

## 2021-08-18 NOTE — Telephone Encounter (Signed)
Patient is requesting a refill of the following medications: Requested Prescriptions   Pending Prescriptions Disp Refills   ALPRAZolam (XANAX) 1 MG tablet [Pharmacy Med Name: ALPRAZOLAM 1MG  TABLETS] 60 tablet     Sig: TAKE 1 TABLET(1 MG) BY MOUTH TWICE DAILY AS NEEDED    Date of patient request: 10/14 Last office visit: 07/27/21 Date of last refill: 06/27/21 Last refill amount: 60 tabs  Follow up time period per chart: 4 days

## 2021-08-18 NOTE — Telephone Encounter (Signed)
Controlled substance database (PDMP) reviewed. No concerns appreciated.  Alprazolam refilled.

## 2021-08-18 NOTE — Telephone Encounter (Signed)
Caller name:Emin Kandis Mannan  On DPR? :Yes  Call back [phone]  Provider they see: Carlota Raspberry  Reason for call:Pt has one RX emtricitabine-tenofovir (TRUVADA) 200-300 MG tablet [[id number]] this is the only medication that goes to [place], [place] - [address] AT [facility]  All other medications need to be sent to [place], [address] AT [place]  No refills at this time just medication list needs to be updates to correct pharmacy

## 2021-08-23 DIAGNOSIS — J301 Allergic rhinitis due to pollen: Secondary | ICD-10-CM | POA: Diagnosis not present

## 2021-08-23 DIAGNOSIS — J3089 Other allergic rhinitis: Secondary | ICD-10-CM | POA: Diagnosis not present

## 2021-08-24 ENCOUNTER — Other Ambulatory Visit: Payer: Self-pay | Admitting: Cardiovascular Disease

## 2021-09-04 DIAGNOSIS — Z20822 Contact with and (suspected) exposure to covid-19: Secondary | ICD-10-CM | POA: Diagnosis not present

## 2021-09-05 DIAGNOSIS — J4540 Moderate persistent asthma, uncomplicated: Secondary | ICD-10-CM | POA: Diagnosis not present

## 2021-09-05 DIAGNOSIS — J3089 Other allergic rhinitis: Secondary | ICD-10-CM | POA: Diagnosis not present

## 2021-09-05 DIAGNOSIS — J301 Allergic rhinitis due to pollen: Secondary | ICD-10-CM | POA: Diagnosis not present

## 2021-09-05 DIAGNOSIS — J019 Acute sinusitis, unspecified: Secondary | ICD-10-CM | POA: Diagnosis not present

## 2021-09-06 DIAGNOSIS — J3089 Other allergic rhinitis: Secondary | ICD-10-CM | POA: Diagnosis not present

## 2021-09-06 DIAGNOSIS — J301 Allergic rhinitis due to pollen: Secondary | ICD-10-CM | POA: Diagnosis not present

## 2021-09-14 ENCOUNTER — Encounter: Payer: Self-pay | Admitting: Family Medicine

## 2021-09-14 DIAGNOSIS — J019 Acute sinusitis, unspecified: Secondary | ICD-10-CM | POA: Diagnosis not present

## 2021-09-14 DIAGNOSIS — J301 Allergic rhinitis due to pollen: Secondary | ICD-10-CM | POA: Diagnosis not present

## 2021-09-14 DIAGNOSIS — J3089 Other allergic rhinitis: Secondary | ICD-10-CM | POA: Diagnosis not present

## 2021-09-14 DIAGNOSIS — J4540 Moderate persistent asthma, uncomplicated: Secondary | ICD-10-CM | POA: Diagnosis not present

## 2021-09-19 DIAGNOSIS — J3089 Other allergic rhinitis: Secondary | ICD-10-CM | POA: Diagnosis not present

## 2021-09-19 DIAGNOSIS — J301 Allergic rhinitis due to pollen: Secondary | ICD-10-CM | POA: Diagnosis not present

## 2021-09-21 NOTE — Telephone Encounter (Signed)
FYI: pt sent recent lab work with his note

## 2021-09-22 DIAGNOSIS — J301 Allergic rhinitis due to pollen: Secondary | ICD-10-CM | POA: Diagnosis not present

## 2021-09-22 DIAGNOSIS — J3089 Other allergic rhinitis: Secondary | ICD-10-CM | POA: Diagnosis not present

## 2021-09-25 ENCOUNTER — Encounter: Payer: Self-pay | Admitting: Family Medicine

## 2021-09-25 ENCOUNTER — Ambulatory Visit: Payer: BC Managed Care – PPO | Admitting: Family Medicine

## 2021-09-25 VITALS — BP 128/70 | HR 81 | Temp 98.3°F | Resp 16 | Ht 71.0 in | Wt 258.8 lb

## 2021-09-25 DIAGNOSIS — Z23 Encounter for immunization: Secondary | ICD-10-CM

## 2021-09-25 DIAGNOSIS — R5383 Other fatigue: Secondary | ICD-10-CM

## 2021-09-25 DIAGNOSIS — Z8669 Personal history of other diseases of the nervous system and sense organs: Secondary | ICD-10-CM | POA: Diagnosis not present

## 2021-09-25 DIAGNOSIS — R911 Solitary pulmonary nodule: Secondary | ICD-10-CM

## 2021-09-25 DIAGNOSIS — R7989 Other specified abnormal findings of blood chemistry: Secondary | ICD-10-CM

## 2021-09-25 LAB — CBC
HCT: 48.8 % (ref 39.0–52.0)
Hemoglobin: 16.4 g/dL (ref 13.0–17.0)
MCHC: 33.7 g/dL (ref 30.0–36.0)
MCV: 91.1 fl (ref 78.0–100.0)
Platelets: 180 10*3/uL (ref 150.0–400.0)
RBC: 5.36 Mil/uL (ref 4.22–5.81)
RDW: 14.8 % (ref 11.5–15.5)
WBC: 8 10*3/uL (ref 4.0–10.5)

## 2021-09-25 LAB — VITAMIN D 25 HYDROXY (VIT D DEFICIENCY, FRACTURES): VITD: 30.29 ng/mL (ref 30.00–100.00)

## 2021-09-25 LAB — COMPREHENSIVE METABOLIC PANEL
ALT: 21 U/L (ref 0–53)
AST: 13 U/L (ref 0–37)
Albumin: 4.1 g/dL (ref 3.5–5.2)
Alkaline Phosphatase: 63 U/L (ref 39–117)
BUN: 15 mg/dL (ref 6–23)
CO2: 28 mEq/L (ref 19–32)
Calcium: 9.1 mg/dL (ref 8.4–10.5)
Chloride: 103 mEq/L (ref 96–112)
Creatinine, Ser: 1.11 mg/dL (ref 0.40–1.50)
GFR: 76.33 mL/min (ref >60.00)
Glucose, Bld: 79 mg/dL (ref 70–99)
Potassium: 4.1 mEq/L (ref 3.5–5.1)
Sodium: 139 mEq/L (ref 135–145)
Total Bilirubin: 0.7 mg/dL (ref 0.2–1.2)
Total Protein: 6.4 g/dL (ref 6.0–8.3)

## 2021-09-25 LAB — TSH: TSH: 1.47 u[IU]/mL (ref 0.35–5.50)

## 2021-09-25 NOTE — Patient Instructions (Addendum)
I will check some labs for fatigue, but if those are normal I would consider repeat sleep testing.  [place] to have the radiologist that read your chest xray request prior images at [place] to compare for possible nodule. We have the option to order new CT as well if needed.  2nd pneumonia vaccine today.   Fatigue If you have fatigue, you feel tired all the time and have a lack of energy or a lack of motivation. Fatigue may make it difficult to start or complete tasks because of exhaustion. In general, occasional or mild fatigue is often a normal response to activity or life. However, long-lasting (chronic) or extreme fatigue may be a symptom of a medical condition. Follow these instructions at home: General instructions Watch your fatigue for any changes. Go to bed and get up at the same time every day. Avoid fatigue by pacing yourself during the day and getting enough sleep at night. Maintain a healthy weight. Medicines Take over-the-counter and prescription medicines only as told by your health care provider. Take a multivitamin, if told by your health care provider.  Do not use herbal or dietary supplements unless they are approved by your health care provider. Activity  Exercise regularly, as told by your health care provider. Use or practice techniques to help you relax, such as yoga, tai chi, meditation, or massage therapy. Eating and drinking  Avoid heavy meals in the evening. Eat a well-balanced diet, which includes lean proteins, whole grains, plenty of fruits and vegetables, and low-fat dairy products. Avoid consuming too much caffeine. Avoid the use of alcohol. Drink enough fluid to keep your urine pale yellow. Lifestyle Change situations that cause you stress. Try to keep your work and personal schedule in balance. Do not use any products that contain nicotine or tobacco, such as cigarettes and e-cigarettes. If you need help quitting, ask your health  care provider. Do not use drugs. Contact a health care provider if: Your fatigue does not get better. You have a fever. You suddenly lose or gain weight. You have headaches. You have trouble falling asleep or sleeping through the night. You feel angry, guilty, anxious, or sad. You are unable to have a bowel movement (constipation). Your skin is dry. You have swelling in your legs or another part of your body. Get help right away if: You feel confused. Your vision is blurry. You feel faint or you pass out. You have a severe headache. You have severe pain in your abdomen, your back, or the area between your waist and hips (pelvis). You have chest pain, shortness of breath, or an irregular or fast heartbeat. You are unable to urinate, or you urinate less than normal. You have abnormal bleeding, such as bleeding from the rectum, vagina, nose, lungs, or nipples. You vomit blood. You have thoughts about hurting yourself or others. If you ever feel like you may hurt yourself or others, or have thoughts about taking your own life, get help right away. You can go to your nearest emergency department or call: Your local emergency services (911 in the [place] A suicide crisis helpline, such as the [place] at 5677455687 or 988 in the [place]. This is open 24 hours a day. Summary If you have fatigue, you feel tired all the time and have a lack of energy or a lack of motivation. Fatigue may make it difficult to start or complete tasks because of exhaustion. Long-lasting (chronic) or extreme fatigue may be a  symptom of a medical condition. Exercise regularly, as told by your health care provider. Change situations that cause you stress. Try to keep your work and personal schedule in balance. This information is not intended to replace advice given to you by your health care provider. Make sure you discuss any questions you have with your health care provider. Document  Revised: 05/17/2021 Document Reviewed: 09/01/2020 Elsevier Patient Education  2022 [employer].

## 2021-09-25 NOTE — Progress Notes (Signed)
Subjective:  Patient ID: Randall Reed., male    DOB: Mar 14, 1971  Age: 50 y.o. MRN: [id number]  CC:  Chief Complaint  Patient presents with   Fatigue    Pt reports feeling really tired all the time, had previously tested and taken Vit D   Results    Pt here to discuss CXR compared to CT    Immunizations    Pt needs pneumonia vaccine     HPI Randall Reed. presents for   Fatigue: Persistent issue. Recent blood work sent in - WBC 11.1.  Per my chart message has had some nasal congestion cough and asthma flares past few weeks.  Was seen by allergist and placed on nebulizer, 21 days of doxycycline after initial Augmentin.  Chest x-ray and blood work was obtained, chest x-ray results as below. Fatigue on and off for years, worse lately. No recent Vit D supplement.   No chest pains, just tired. Taking naps during the day at times.   Lab Results  Component Value Date   WBC 13.9 (H) 07/27/2021   HGB 16.8 07/27/2021   HCT 49.1 07/27/2021   MCV 89.6 07/27/2021   PLT 219.0 07/27/2021   Lab Results  Component Value Date   TSH 1.330 10/14/2020   Lab Results  Component Value Date   NA 137 10/14/2020   K 4.2 10/14/2020   CL 99 10/14/2020   CO2 24 10/14/2020  Last vitamin D Lab Results  Component Value Date   VD25OH 26.3 (L) 04/24/2018  Most recent vitamin D borderline at 30 on 03/28/2021 - not sure if on supplement.  History of sleep apnea per chart. Improved with weight loss. No use of CPAP. Last study 8-10 years ago. Has had some weight fluctuations (up to 300, down to 225, then up to 258.  Unknown if snoring. Nocturia 1-2 times per night. Sleep for 7-7.5 hrs on average No paroxysmal nocturnal dyspnea.  Wt Readings from Last 3 Encounters:  09/25/21 258 lb 12.8 oz (117.4 kg)  07/27/21 250 lb 6.4 oz (113.6 kg)  05/25/21 241 lb 6.4 oz (109.5 kg)  Slight leukocytosis on 07/27/21.  No fever, no unexplained wt loss. Has received 2 cycles of steroids recently for asthma  issues.  Lab Results  Component Value Date   WBC 13.9 (H) 07/27/2021   HGB 16.8 07/27/2021   HCT 49.1 07/27/2021   MCV 89.6 07/27/2021   PLT 219.0 07/27/2021   Lab Results  Component Value Date   TSH 1.330 10/14/2020    Abnormal chest x-ray See recent MyChart messages.  Chest x-ray obtained on November 10 without acute cardiopulmonary disease.  Possible lung nodule projecting over T6 vertebral body on the lateral view but no pleural effusion or pneumothorax.  Recommended comparison with previous study or CT.  Most recent imaging of CT abdomen did not make any comment on lower chest.  Coronary calcium score CT look at that chest in June 2019 and no mention of nodule or significant lymph nodes in the lower chest cavity imaged.  Previous CT scan of chest in 2013 without pulmonary nodules or mass lesions.  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  I discussed comparison views with [facility] imaging and I have recommended that radiologist from [facility] that with a chest x-ray request images locally or option of chest CT  Immunization review  Due for updated Pneumovax.  History of asthma. had initial vaccine at PCP in [place], Dr. Woodfin Ganja. PPSV 23.  08/05/20.  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/04/2014   rotator cuff tear 10/17/2013   Hypogonadism male 09/04/2013   ED (erectile dysfunction) 07/30/2013   Palpitations 02/21/2012   BMI 33.0-33.9,adult 02/04/2012   Allergic rhinitis 02/04/2012   GERD (gastroesophageal reflux disease) 02/04/2012   Migraine 02/04/2012   GAD (generalized anxiety disorder) 02/04/2012   Insomnia 02/04/2012   Essential hypertension 04/13/2009   CHEST PAIN-UNSPECIFIED 04/13/2009   Past Medical History:  Diagnosis Date   Anxiety    Arthritis    Asthma    Depression    GERD (gastroesophageal reflux disease)    Hyperlipidemia    Phreesia 12/05/2020   Seasonal allergic reaction    Past Surgical History:  Procedure Laterality Date   LIPOMA EXCISION N/A 10/30/2018   Procedure: EXCISION OF MULTIPLE SUBCUTANEOUS LIPOMAS ON TORSO;  Surgeon: Donnie Mesa, MD;  Location: [facility];  Service: General;  Laterality: N/A;   none     none     Allergies  Allergen Reactions   Desloratadine Other (See Comments)    CLARINEX-"severe headache"   Loratadine Other (See Comments)    "severe headache"   Other    Levbid [Hyoscyamine Sulfate] Rash   Telithromycin Rash   Prior to Admission medications   Medication Sig Start Date End Date Taking? Authorizing Provider  acetic acid-hydrocortisone (VOSOL-HC) otic solution Place 3 drops into both ears 3 (three) times daily. Use as needed to prevent swimmer's ear 01/10/15  Yes Leandrew Koyanagi, MD  albuterol (VENTOLIN HFA) 108 (90 Base) MCG/ACT inhaler INHALE 2 PUFFS INTO THE LUNGS EVERY 6 HOURS AS NEEDED FOR WHEEZING OR SHORTNESS OF BREATH 07/19/20  Yes [name], MD  [facility] ALLERGY 180 MG tablet Take 180 mg by mouth daily. 07/12/20  Yes [provider]  ALPRAZolam [name]) 1 MG tablet TAKE 1 TABLET(1 MG) BY MOUTH TWICE DAILY AS NEEDED 08/18/21  Yes [name], MD  aspirin EC 81 MG tablet Take 1 tablet (81 mg total) by mouth daily. 03/07/15  Yes Sherren Mocha, MD  benzonatate (TESSALON) 100 MG capsule Take 1-2 capsules (100-200 mg total) by mouth 3 (three) times daily as needed for cough. 07/21/21  Yes [name], MD  Budeson-Glycopyrrol-Formoterol (BREZTRI AEROSPHERE) 160-9-4.8 MCG/ACT AERO  09/14/21  Yes [provider]  cyclobenzaprine (FLEXERIL) 10 MG tablet Take 10 mg by mouth 3 (three) times daily as needed for muscle spasms.  04/08/20  Yes [provider]  Diclofenac Sodium (PENNSAID) 2 % SOLN Apply topically as needed.  04/08/20  Yes [provider]  dicyclomine (BENTYL) 20 MG tablet Take 20 mg by mouth 2 (two) times a day.   Yes [provider]  diltiazem (CARDIZEM CD) 180 MG 24 hr capsule TAKE 1 CAPSULE(180 MG) BY MOUTH DAILY 03/23/21  Yes Sherren Mocha, MD  emtricitabine-tenofovir (TRUVADA) 200-300 MG tablet Take  1 tablet by mouth daily. 05/25/21  Yes [name], MD  emtricitabine-tenofovir (TRUVADA) 200-300 MG tablet Take 1 tablet by mouth daily. 08/18/21  Yes [name], MD  famotidine (PEPCID) 40 MG tablet Take 40 mg by mouth at bedtime.  02/18/20  Yes [provider]  Fexofenadine HCl (ALLEGRA PO) Take 1 tablet by mouth daily.    Yes [provider]  fluticasone (FLONASE) 50 MCG/ACT nasal spray Place 2 sprays into both nostrils daily. 07/12/20  Yes [provider]  hydrocortisone (ANUSOL-HC) 2.5 % rectal cream APPLY RECTALLY TWICE DAILY 06/30/20  Yes [name], MD  ipratropium (ATROVENT) 0.06 % nasal spray Place 2 sprays into the nose 2 (two) times daily. 05/25/21  Yes [name], MD  losartan (COZAAR) 25 MG tablet Take 1 tablet (25 mg total) by mouth daily. 05/25/21  Yes [name], MD  metoprolol succinate (TOPROL-XL) 25 MG 24 hr tablet TAKE 1 TABLET(25 MG) BY MOUTH AT BEDTIME 05/24/21  Yes Sherren Mocha, MD  montelukast (SINGULAIR) 10 MG tablet TAKE 1 TABLET(10 MG) BY MOUTH AT BEDTIME 05/25/21  Yes [name], MD  mupirocin  ointment (BACTROBAN) 2 % SMARTSIG:1 Application Topical 2-3 Times Daily 08/23/20  Yes [provider]  Olopatadine-Mometasone ([name]) [phone] MCG/ACT SUSP Place into the nose.   Yes [provider]  ondansetron (ZOFRAN-ODT) 4 MG disintegrating tablet Take 4 mg by mouth every 8 (eight) hours as needed for nausea or vomiting.   Yes [provider]  pantoprazole (PROTONIX) 40 MG tablet Take 40 mg by mouth 2 (two) times daily.  04/08/20  Yes [provider]  Potassium Chloride ER 20 MEQ TBCR TAKE 1 TABLET BY MOUTH DAILY 08/24/21  Yes Sherren Mocha, MD  pramoxine-hydrocortisone (PROCTOCREAM-HC) 1-1 % rectal cream APPLY TO AFFECTED AREA THREE TIMES DAILY   Yes [provider]  predniSONE (DELTASONE) 20 MG tablet Take 2 tablets (40 mg total) by mouth daily with breakfast. 07/21/21  Yes [name], MD  rizatriptan (MAXALT) 10 MG tablet Take 1 tablet (10 mg total) by mouth as needed for migraine. May repeat in 2 hours if needed 12/22/20  Yes [name], MD  rosuvastatin (CRESTOR) 10 MG tablet TAKE 1 TABLET(10 MG) BY MOUTH DAILY 03/23/21  Yes Sherren Mocha, MD  sildenafil (VIAGRA) 100 MG tablet TAKE 1/2 TO 1 TABLET BY MOUTH ONCE DAILY AS NEEDED PRIOR TO SEXUAL ACTIVITY 03/23/21  Yes Sherren Mocha, MD  SUMAtriptan (IMITREX) 100 MG tablet Take 100 mg by mouth (1 tablet) at for signs of migraine headache. May take 1 more dose 2 hours later. Do not exceed 200 mg in 24 hours. 12/20/20  Yes [provider]  SYMBICORT 160-4.5 MCG/ACT inhaler Inhale 2 puffs into the lungs in the morning and at bedtime.  02/28/20  Yes [provider]  tamsulosin (FLOMAX) 0.4 MG CAPS capsule Take 1 capsule (0.4 mg total) by mouth daily as needed. 08/17/21  Yes [name], MD  testosterone cypionate (DEPOTESTOTERONE CYPIONATE) 100 MG/ML injection Inject 0.75 mg into the muscle every 7 (seven) days. For IM use only   Yes [provider]  zolpidem  (AMBIEN) 10 MG tablet TAKE 1 TABLET(10 MG) BY MOUTH AT BEDTIME 07/14/21  Yes [name], MD  amoxicillin-clavulanate (AUGMENTIN) 875-125 MG tablet Take 1 tablet by mouth 2 (two) times daily. Patient not taking: Reported on 09/25/2021 08/01/21   [name], MD  doxycycline (VIBRAMYCIN) 100 MG capsule Take 100 mg  by mouth 2 (two) times daily. 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)  Tobacco Use   Smoking status: Never   Smokeless tobacco: Never  Vaping Use   Vaping Use: Never used  Substance and Sexual Activity   Alcohol use: Yes    Alcohol/week: 3.0 standard drinks    Types: 3 Standard drinks or equivalent per week   Drug use: No   Sexual activity: Yes  Other Topics Concern   Not on file  Social History Narrative   Not on file   Social Determinants of Health   Financial Resource Strain: Not on file  Food Insecurity: Not on file  Transportation Needs: Not on file  Physical Activity: Not on file  Stress: Not on file  Social Connections: Not on file  Intimate Partner Violence: Not on file    Review of Systems   Objective:   Vitals:   09/25/21 0910  BP: 128/70  Pulse: 81  Resp: 16  Temp: 98.3 F (36.8 C)  TempSrc: Temporal  SpO2: 95%  Weight: 258 lb 12.8 oz (117.4 kg)  Height: $Remove'5\' 11"'bBTHpjI$  (1.803 m)     Physical Exam Vitals reviewed.  Constitutional:      Appearance: He is well-developed.  HENT:     Head: Normocephalic and atraumatic.  Neck:     Vascular: No carotid bruit or JVD.  Cardiovascular:     Rate and Rhythm: Normal rate and regular rhythm.     Heart sounds: Normal heart sounds. No murmur heard. Pulmonary:     Effort: Pulmonary effort is normal. No respiratory distress.      Breath sounds: Normal breath sounds. No stridor. No wheezing, rhonchi or rales.  Abdominal:     General: Abdomen is flat.     Tenderness: There is no abdominal tenderness.  Musculoskeletal:     Right lower leg: No edema.     Left lower leg: No edema.  Skin:    General: Skin is warm and dry.  Neurological:     Mental Status: He is alert and oriented to person, place, and time.  Psychiatric:        Mood and Affect: Mood normal.     35 minutes spent during visit, including chart review, counseling and assimilation of information, exam, discussion of plan, and chart completion.    Assessment & Plan:  Randall Reed. is a 50 y.o. male . Fatigue, unspecified type - Plan: TSH, Comprehensive metabolic panel, CBC, Vitamin D (25 hydroxy) History of sleep apnea Low vitamin D level - Plan: Vitamin D (25 hydroxy)  Possibly multifactorial.  Treated for recent asthma flare/infections.  Leukocytosis may be related to prednisone use.  Afebrile.  History of sleep apnea, may need repeat testing but will start with labs.  No red flags on exam/history.  History of low vitamin D, off supplementation at this time as well.  Check CBC, TSH, vitamin D, CMP.  If all normal, then repeat sleep study likely will be limited/refer to sleep specialist.  Lung nodule  -Plan for possible review of previous imaging with radiologist at [facility].  If that is unable to happen, then I can order CT to  evaluate possible nodule.  Need for vaccination for Strep pneumoniae - Plan: Pneumococcal conjugate vaccine 13-valent IM No orders of the defined types were placed in this encounter.  Patient Instructions  I will check some labs for fatigue, but if those are normal I would consider repeat sleep testing.  [place] to have the radiologist that read your chest xray request prior images at [place] to compare for possible nodule. We have the option to order new CT as well if  needed.  2nd pneumonia vaccine today.   Fatigue If you have fatigue, you feel tired all the time and have a lack of energy or a lack of motivation. Fatigue may make it difficult to start or complete tasks because of exhaustion. In general, occasional or mild fatigue is often a normal response to activity or life. However, long-lasting (chronic) or extreme fatigue may be a symptom of a medical condition. Follow these instructions at home: General instructions Watch your fatigue for any changes. Go to bed and get up at the same time every day. Avoid fatigue by pacing yourself during the day and getting enough sleep at night. Maintain a healthy weight. Medicines Take over-the-counter and prescription medicines only as told by your health care provider. Take a multivitamin, if told by your health care provider.  Do not use herbal or dietary supplements unless they are approved by your health care provider. Activity  Exercise regularly, as told by your health care provider. Use or practice techniques to help you relax, such as yoga, tai chi, meditation, or massage therapy. Eating and drinking  Avoid heavy meals in the evening. Eat a well-balanced diet, which includes lean proteins, whole grains, plenty of fruits and vegetables, and low-fat dairy products. Avoid consuming too much caffeine. Avoid the use of alcohol. Drink enough fluid to keep your urine pale yellow. Lifestyle Change situations that cause you stress. Try to keep your work and personal schedule in balance. Do not use any products that contain nicotine or tobacco, such as cigarettes and e-cigarettes. If you need help quitting, ask your health care provider. Do not use drugs. Contact a health care provider if: Your fatigue does not get better. You have a fever. You suddenly lose or gain weight. You have headaches. You have trouble falling asleep or sleeping through the night. You feel angry, guilty, anxious, or sad. You  are unable to have a bowel movement (constipation). Your skin is dry. You have swelling in your legs or another part of your body. Get help right away if: You feel confused. Your vision is blurry. You feel faint or you pass out. You have a severe headache. You have severe pain in your abdomen, your back, or the area between your waist and hips (pelvis). You have chest pain, shortness of breath, or an irregular or fast heartbeat. You are unable to urinate, or you urinate less than normal. You have abnormal bleeding, such as bleeding from the rectum, vagina, nose, lungs, or nipples. You vomit blood. You have thoughts about hurting yourself or others. If you ever feel like you may hurt yourself or others, or have thoughts about taking your own life, get help right away. You can go to your nearest emergency department or call: Your local emergency services (911 in the [place] A suicide crisis helpline, such as the [place] at 305-042-6307 or 988 in the [place]. This is open 24 hours a day. Summary If you have fatigue, you feel tired all the  time and have a lack of energy or a lack of motivation. Fatigue may make it difficult to start or complete tasks because of exhaustion. Long-lasting (chronic) or extreme fatigue may be a symptom of a medical condition. Exercise regularly, as told by your health care provider. Change situations that cause you stress. Try to keep your work and personal schedule in balance. This information is not intended to replace advice given to you by your health care provider. Make sure you discuss any questions you have with your health care provider. Document Revised: 05/17/2021 Document Reviewed: 09/01/2020 Elsevier Patient Education  2022 [place],   [name], MD [place] 09/25/21 10:02 AM

## 2021-10-03 ENCOUNTER — Encounter: Payer: Self-pay | Admitting: Family Medicine

## 2021-10-03 DIAGNOSIS — R7989 Other specified abnormal findings of blood chemistry: Secondary | ICD-10-CM

## 2021-10-03 DIAGNOSIS — J3089 Other allergic rhinitis: Secondary | ICD-10-CM | POA: Diagnosis not present

## 2021-10-03 DIAGNOSIS — R911 Solitary pulmonary nodule: Secondary | ICD-10-CM

## 2021-10-03 DIAGNOSIS — J301 Allergic rhinitis due to pollen: Secondary | ICD-10-CM | POA: Diagnosis not present

## 2021-10-03 DIAGNOSIS — J4550 Severe persistent asthma, uncomplicated: Secondary | ICD-10-CM | POA: Diagnosis not present

## 2021-10-05 NOTE — Telephone Encounter (Signed)
See my chart message

## 2021-10-06 MED ORDER — VITAMIN D (ERGOCALCIFEROL) 1.25 MG (50000 UNIT) PO CAPS
50000.0000 [IU] | ORAL_CAPSULE | ORAL | 0 refills | Status: DC
Start: 2021-10-06 — End: 2022-05-15

## 2021-10-06 MED ORDER — VITAMIN D (ERGOCALCIFEROL) 1.25 MG (50000 UNIT) PO CAPS: 50000.0000 [IU] | ORAL_CAPSULE | ORAL | 0 refills | Status: DC

## 2021-10-06 NOTE — Telephone Encounter (Addendum)
Pt had been taking 50,000 units until mid September previously tried 2000 units and did not raise him to target range, should pt take Rx strength?

## 2021-10-06 NOTE — Addendum Note (Signed)
Addended by: [name] R on: 10/06/2021 02:17 PM   Modules accepted: Orders

## 2021-10-08 ENCOUNTER — Other Ambulatory Visit: Payer: Self-pay | Admitting: Family Medicine

## 2021-10-08 DIAGNOSIS — F5104 Psychophysiologic insomnia: Secondary | ICD-10-CM

## 2021-10-09 ENCOUNTER — Ambulatory Visit: Payer: BC Managed Care – PPO | Admitting: Pulmonary Disease

## 2021-10-09 ENCOUNTER — Encounter: Payer: Self-pay | Admitting: Pulmonary Disease

## 2021-10-09 ENCOUNTER — Other Ambulatory Visit: Payer: Self-pay

## 2021-10-09 VITALS — BP 126/76 | HR 110 | Temp 97.8°F | Ht 71.0 in | Wt 268.4 lb

## 2021-10-09 DIAGNOSIS — R911 Solitary pulmonary nodule: Secondary | ICD-10-CM

## 2021-10-09 DIAGNOSIS — J4550 Severe persistent asthma, uncomplicated: Secondary | ICD-10-CM | POA: Diagnosis not present

## 2021-10-09 NOTE — Telephone Encounter (Signed)
Patient is requesting a refill of the following medications: Requested Prescriptions   Pending Prescriptions Disp Refills   zolpidem (AMBIEN) 10 MG tablet [Pharmacy Med Name: ZOLPIDEM 10MG  TABLETS] 90 tablet     Sig: TAKE 1 TABLET(10 MG) BY MOUTH AT BEDTIME    Date of patient request: 10/08/2021 Last office visit: 09/25/2021 Date of last refill: 07/14/2021 Last refill amount: 90 tablets  Follow up time period per chart: 11/13/2021

## 2021-10-09 NOTE — Patient Instructions (Addendum)
Thank you for visiting Dr. Valeta Harms at [facility]. Today we recommend the following:  Orders Placed This Encounter  Procedures   CT CHEST WO CONTRAST   Please have CT complete prior to next appt  Return in about 2 weeks (around 10/23/2021) for with Eric Form, NP.    Please do your part to reduce the spread of COVID-19.

## 2021-10-09 NOTE — Progress Notes (Signed)
Synopsis: Referred in December 2022 for lung nodule by [name], MD  Subjective:   PATIENT ID: Randall Reed. GENDER: male DOB: [birth year], MRN: [id number]  Chief Complaint  Patient presents with   Consult    Patient is here to talk about nodule and asthma     This is a 50 year old gentleman, past medical history of arthritis, asthma, depression, GERD, hyperlipidemia.Completed at  Here today for evaluation after having a chest x-ray [facility] on 09/14/20[facility].  This chest x-ray revealed a possible nodule over the T6 vertebral body in the lateral view.  No axial CT imaging has been completed. Life long non-smoker. However had a cousin, also male, non-smoker at age 84 diagnosed with lung cancer.  From respiratory standpoint he is doing better.  Saw the allergist for the treatment of his asthma, started on Breo history and recently planning to start Dupixent injections this week.   Past Medical History:  Diagnosis Date   Anxiety    Arthritis    Asthma    Depression    GERD (gastroesophageal reflux disease)    Hyperlipidemia    [employer] 12/05/2020   Seasonal allergic reaction      Family History  Problem Relation Age of Onset   Coronary artery disease Father 45       stents, CABG   Heart attack Father    Coronary artery disease Paternal Grandfather    Heart attack Maternal Grandfather    Hypertension Other    Heart attack Other    Cancer Other    Breast cancer Other    Hyperlipidemia Other      Past Surgical History:  Procedure Laterality Date   LIPOMA EXCISION N/A 10/30/2018   Procedure: EXCISION OF MULTIPLE SUBCUTANEOUS LIPOMAS ON TORSO;  Surgeon: Donnie Mesa, MD;  Location: [facility];  Service: General;  Laterality: N/A;   none     none      Social History   Socioeconomic History   Marital status: Single    Spouse name: Not on file   Number of children: 0   Years of education: Not on file   Highest education level: Not on file   Occupational History   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)  Tobacco Use   Smoking status: Never   Smokeless tobacco: Never  Vaping Use   Vaping Use: Never used  Substance and Sexual Activity   Alcohol use: Yes    Alcohol/week: 3.0 standard drinks    Types: 3 Standard drinks or equivalent per week   Drug use: No   Sexual activity: Yes  Other Topics Concern   Not on file  Social History Narrative   Not on file   Social Determinants of Health   Financial Resource Strain: Not on file  Food Insecurity: Not on file  Transportation Needs: Not on file  Physical Activity: Not on file  Stress: Not on file  Social Connections: Not on file  Intimate Partner Violence: Not on file     Allergies  Allergen Reactions   Desloratadine Other (See Comments)    CLARINEX-"severe headache"   Loratadine Other (See Comments)    "severe headache"   Other    Levbid [Hyoscyamine Sulfate] Rash   Telithromycin Rash     Outpatient Medications Prior to Visit  Medication Sig Dispense Refill   acetic acid-hydrocortisone (VOSOL-HC) otic solution Place 3 drops into both ears 3 (three) times daily. Use as needed to prevent swimmer's ear 10 mL 5  albuterol (VENTOLIN HFA) 108 (90 Base) MCG/ACT inhaler INHALE 2 PUFFS INTO THE LUNGS EVERY 6 HOURS AS NEEDED FOR WHEEZING OR SHORTNESS OF BREATH 8.5 g 0   ALLEGRA ALLERGY 180 MG tablet Take 180 mg by mouth daily.     ALPRAZolam (XANAX) 1 MG tablet TAKE 1 TABLET(1 MG) BY MOUTH TWICE DAILY AS NEEDED 60 tablet 0   aspirin EC 81 MG tablet Take 1 tablet (81 mg total) by mouth daily.     benzonatate (TESSALON) 100 MG capsule Take 1-2 capsules (100-200 mg total) by mouth 3 (three) times daily as needed for cough. 30 capsule 0   Budeson-Glycopyrrol-Formoterol (BREZTRI AEROSPHERE) 160-9-4.8 MCG/ACT AERO      cyclobenzaprine (FLEXERIL) 10 MG tablet Take 10 mg by mouth 3 (three) times daily as needed for muscle spasms.      Diclofenac Sodium  (PENNSAID) 2 % SOLN Apply topically as needed.      dicyclomine (BENTYL) 20 MG tablet Take 20 mg by mouth 2 (two) times a day.     diltiazem (CARDIZEM CD) 180 MG 24 hr capsule TAKE 1 CAPSULE(180 MG) BY MOUTH DAILY 90 capsule 3   doxycycline (VIBRAMYCIN) 100 MG capsule Take 100 mg by mouth 2 (two) times daily.     DUPIXENT 300 MG/2ML prefilled syringe Inject into the skin.     emtricitabine-tenofovir (TRUVADA) 200-300 MG tablet Take 1 tablet by mouth daily. 90 tablet 1   emtricitabine-tenofovir (TRUVADA) 200-300 MG tablet Take 1 tablet by mouth daily. 90 tablet 1   famotidine (PEPCID) 40 MG tablet Take 40 mg by mouth at bedtime.      Fexofenadine HCl (ALLEGRA PO) Take 1 tablet by mouth daily.      fluticasone (FLONASE) 50 MCG/ACT nasal spray Place 2 sprays into both nostrils daily.     hydrocortisone (ANUSOL-HC) 2.5 % rectal cream APPLY RECTALLY TWICE DAILY 30 g 0   ipratropium (ATROVENT) 0.06 % nasal spray Place 2 sprays into the nose 2 (two) times daily. 15 mL 11   losartan (COZAAR) 25 MG tablet Take 1 tablet (25 mg total) by mouth daily. 90 tablet 1   metoprolol succinate (TOPROL-XL) 25 MG 24 hr tablet TAKE 1 TABLET(25 MG) BY MOUTH AT BEDTIME 90 tablet 3   montelukast (SINGULAIR) 10 MG tablet TAKE 1 TABLET(10 MG) BY MOUTH AT BEDTIME 90 tablet 3   mupirocin ointment (BACTROBAN) 2 % SMARTSIG:1 Application Topical 2-3 Times Daily     Olopatadine-Mometasone ([name]) 665-25 MCG/ACT SUSP Place into the nose.     ondansetron (ZOFRAN-ODT) 4 MG disintegrating tablet Take 4 mg by mouth every 8 (eight) hours as needed for nausea or vomiting.     pantoprazole (PROTONIX) 40 MG tablet Take 40 mg by mouth 2 (two) times daily.      Potassium Chloride ER 20 MEQ TBCR TAKE 1 TABLET BY MOUTH DAILY 180 tablet 2   pramoxine-hydrocortisone (PROCTOCREAM-HC) 1-1 % rectal cream APPLY TO AFFECTED AREA THREE TIMES DAILY     predniSONE (DELTASONE) 20 MG tablet Take 2 tablets (40 mg total) by mouth daily with breakfast.  (Patient taking differently: Take 20 mg by mouth daily with breakfast.) 10 tablet 0   rizatriptan (MAXALT) 10 MG tablet Take 1 tablet (10 mg total) by mouth as needed for migraine. May repeat in 2 hours if needed 10 tablet 0   rosuvastatin (CRESTOR) 10 MG tablet TAKE 1 TABLET(10 MG) BY MOUTH DAILY 90 tablet 3   sildenafil (VIAGRA) 100 MG tablet TAKE 1/2 TO 1 TABLET  BY MOUTH ONCE DAILY AS NEEDED PRIOR TO SEXUAL ACTIVITY 90 tablet 3   SUMAtriptan (IMITREX) 100 MG tablet Take 100 mg by mouth (1 tablet) at for signs of migraine headache. May take 1 more dose 2 hours later. Do not exceed 200 mg in 24 hours.     SYMBICORT 160-4.5 MCG/ACT inhaler Inhale 2 puffs into the lungs in the morning and at bedtime.      tamsulosin (FLOMAX) 0.4 MG CAPS capsule Take 1 capsule (0.4 mg total) by mouth daily as needed. 15 capsule 0   testosterone cypionate (DEPOTESTOTERONE CYPIONATE) 100 MG/ML injection Inject 0.75 mg into the muscle every 7 (seven) days. For IM use only     Vitamin D, Ergocalciferol, (DRISDOL) 1.25 MG (50000 UNIT) CAPS capsule Take 1 capsule (50,000 Units total) by mouth every 7 (seven) days. 15 capsule 0   zolpidem (AMBIEN) 10 MG tablet TAKE 1 TABLET(10 MG) BY MOUTH AT BEDTIME 90 tablet 0   amoxicillin-clavulanate (AUGMENTIN) 875-125 MG tablet Take 1 tablet by mouth 2 (two) times daily. (Patient not taking: Reported on 09/25/2021) 14 tablet 0   No facility-administered medications prior to visit.    Review of Systems  Constitutional:  Negative for chills, fever, malaise/fatigue and weight loss.  HENT:  Negative for hearing loss, sore throat and tinnitus.   Eyes:  Negative for blurred vision and double vision.  Respiratory:  Positive for cough, shortness of breath and wheezing. Negative for hemoptysis, sputum production and stridor.   Cardiovascular:  Negative for chest pain, palpitations, orthopnea, leg swelling and PND.  Gastrointestinal:  Negative for abdominal pain, constipation, diarrhea,  heartburn, nausea and vomiting.  Genitourinary:  Negative for dysuria, hematuria and urgency.  Musculoskeletal:  Negative for joint pain and myalgias.  Skin:  Negative for itching and rash.  Neurological:  Negative for dizziness, tingling, weakness and headaches.  Endo/Heme/Allergies:  Negative for environmental allergies. Does not bruise/bleed easily.  Psychiatric/Behavioral:  Negative for depression. The patient is not nervous/anxious and does not have insomnia.   All other systems reviewed and are negative.   Objective:  Physical Exam   Vitals:   10/09/21 1558  BP: 126/76  Pulse: (!) 110  Temp: 97.8 F (36.6 C)  TempSrc: Oral  SpO2: 96%  Weight: 268 lb 6.4 oz (121.7 kg)  Height: 5\' 11"  (1.803 m)   96% on RA BMI Readings from Last 3 Encounters:  10/09/21 37.43 kg/m  09/25/21 36.10 kg/m  07/27/21 34.92 kg/m   Wt Readings from Last 3 Encounters:  10/09/21 268 lb 6.4 oz (121.7 kg)  09/25/21 258 lb 12.8 oz (117.4 kg)  07/27/21 250 lb 6.4 oz (113.6 kg)     CBC    Component Value Date/Time   WBC 8.0 09/25/2021 1005   RBC 5.36 09/25/2021 1005   HGB 16.4 09/25/2021 1005   HGB 18.7 (H) 04/04/2018 1741   HCT 48.8 09/25/2021 1005   HCT 52.8 (H) 04/04/2018 1741   PLT 180.0 09/25/2021 1005   PLT 231 04/04/2018 1741   MCV 91.1 09/25/2021 1005   MCV 88 04/04/2018 1741   MCH 31.1 04/04/2018 1741   MCH 29.5 04/20/2016 1609   MCHC 33.7 09/25/2021 1005   RDW 14.8 09/25/2021 1005   RDW 13.6 04/04/2018 1741   LYMPHSABS 2.1 05/25/2021 1348   MONOABS 0.6 05/25/2021 1348   EOSABS 0.1 05/25/2021 1348   BASOSABS 0.0 05/25/2021 1348     Chest Imaging: 09/10/2021 chest x-ray: Lateral view chest x-ray report with possible lung nodule  over T6 vertebra. The patient's images have been independently reviewed by me.    Pulmonary Functions Testing Results: No flowsheet data found.  FeNO:   Pathology:   Echocardiogram:   Heart Catheterization:     Assessment & Plan:      ICD-10-CM   1. Lung nodule  R91.1 CT CHEST WO CONTRAST    2. Severe persistent asthma without complication  X73.53       Discussion:  This is a 50 year old gentleman, lung nodule on chest x-ray, also has severe persistent asthma.  Has a cousin who is male that had known smoking-related lung cancer.  Also has family members with lung cancer.  He himself is a non-smoker.  Plan: Continue management of asthma with allergist. I think his current regimen seems appropriate and he is doing well with it. Continue Breztri and the initiation of Dupixent. Hopefully he will start to see some improvement after starting Dupixent injections. He does have some questions regarding Dupixent and the use of prep/Truvada. I will have one of our pharmacist from the office reach out to him to see if they can help with these questions. CT scan of the chest for evaluation of the lung nodule Can see me or APP in a few weeks virtual visit to discuss CT results.   Current Outpatient Medications:    acetic acid-hydrocortisone (VOSOL-HC) otic solution, Place 3 drops into both ears 3 (three) times daily. Use as needed to prevent swimmer's ear, Disp: 10 mL, Rfl: 5   albuterol (VENTOLIN HFA) 108 (90 Base) MCG/ACT inhaler, INHALE 2 PUFFS INTO THE LUNGS EVERY 6 HOURS AS NEEDED FOR WHEEZING OR SHORTNESS OF BREATH, Disp: 8.5 g, Rfl: 0   ALLEGRA ALLERGY 180 MG tablet, Take 180 mg by mouth daily., Disp: , Rfl:    ALPRAZolam (XANAX) 1 MG tablet, TAKE 1 TABLET(1 MG) BY MOUTH TWICE DAILY AS NEEDED, Disp: 60 tablet, Rfl: 0   aspirin EC 81 MG tablet, Take 1 tablet (81 mg total) by mouth daily., Disp: , Rfl:    benzonatate (TESSALON) 100 MG capsule, Take 1-2 capsules (100-200 mg total) by mouth 3 (three) times daily as needed for cough., Disp: 30 capsule, Rfl: 0   Budeson-Glycopyrrol-Formoterol (BREZTRI AEROSPHERE) 160-9-4.8 MCG/ACT AERO, , Disp: , Rfl:    cyclobenzaprine (FLEXERIL) 10 MG tablet, Take 10 mg by mouth 3 (three)  times daily as needed for muscle spasms. , Disp: , Rfl:    Diclofenac Sodium (PENNSAID) 2 % SOLN, Apply topically as needed. , Disp: , Rfl:    dicyclomine (BENTYL) 20 MG tablet, Take 20 mg by mouth 2 (two) times a day., Disp: , Rfl:    diltiazem (CARDIZEM CD) 180 MG 24 hr capsule, TAKE 1 CAPSULE(180 MG) BY MOUTH DAILY, Disp: 90 capsule, Rfl: 3   doxycycline (VIBRAMYCIN) 100 MG capsule, Take 100 mg by mouth 2 (two) times daily., Disp: , Rfl:    DUPIXENT 300 MG/2ML prefilled syringe, Inject into the skin., Disp: , Rfl:    emtricitabine-tenofovir (TRUVADA) 200-300 MG tablet, Take 1 tablet by mouth daily., Disp: 90 tablet, Rfl: 1   emtricitabine-tenofovir (TRUVADA) 200-300 MG tablet, Take 1 tablet by mouth daily., Disp: 90 tablet, Rfl: 1   famotidine (PEPCID) 40 MG tablet, Take 40 mg by mouth at bedtime. , Disp: , Rfl:    Fexofenadine HCl (ALLEGRA PO), Take 1 tablet by mouth daily. , Disp: , Rfl:    fluticasone (FLONASE) 50 MCG/ACT nasal spray, Place 2 sprays into both nostrils daily., Disp: ,  Rfl:    hydrocortisone (ANUSOL-HC) 2.5 % rectal cream, APPLY RECTALLY TWICE DAILY, Disp: 30 g, Rfl: 0   ipratropium (ATROVENT) 0.06 % nasal spray, Place 2 sprays into the nose 2 (two) times daily., Disp: 15 mL, Rfl: 11   losartan (COZAAR) 25 MG tablet, Take 1 tablet (25 mg total) by mouth daily., Disp: 90 tablet, Rfl: 1   metoprolol succinate (TOPROL-XL) 25 MG 24 hr tablet, TAKE 1 TABLET(25 MG) BY MOUTH AT BEDTIME, Disp: 90 tablet, Rfl: 3   montelukast (SINGULAIR) 10 MG tablet, TAKE 1 TABLET(10 MG) BY MOUTH AT BEDTIME, Disp: 90 tablet, Rfl: 3   mupirocin ointment (BACTROBAN) 2 %, SMARTSIG:1 Application Topical 2-3 Times Daily, Disp: , Rfl:    Olopatadine-Mometasone ([name]) 665-25 MCG/ACT SUSP, Place into the nose., Disp: , Rfl:    ondansetron (ZOFRAN-ODT) 4 MG disintegrating tablet, Take 4 mg by mouth every 8 (eight) hours as needed for nausea or vomiting., Disp: , Rfl:    pantoprazole (PROTONIX) 40 MG  tablet, Take 40 mg by mouth 2 (two) times daily. , Disp: , Rfl:    Potassium Chloride ER 20 MEQ TBCR, TAKE 1 TABLET BY MOUTH DAILY, Disp: 180 tablet, Rfl: 2   pramoxine-hydrocortisone (PROCTOCREAM-HC) 1-1 % rectal cream, APPLY TO AFFECTED AREA THREE TIMES DAILY, Disp: , Rfl:    predniSONE (DELTASONE) 20 MG tablet, Take 2 tablets (40 mg total) by mouth daily with breakfast. (Patient taking differently: Take 20 mg by mouth daily with breakfast.), Disp: 10 tablet, Rfl: 0   rizatriptan (MAXALT) 10 MG tablet, Take 1 tablet (10 mg total) by mouth as needed for migraine. May repeat in 2 hours if needed, Disp: 10 tablet, Rfl: 0   rosuvastatin (CRESTOR) 10 MG tablet, TAKE 1 TABLET(10 MG) BY MOUTH DAILY, Disp: 90 tablet, Rfl: 3   sildenafil (VIAGRA) 100 MG tablet, TAKE 1/2 TO 1 TABLET BY MOUTH ONCE DAILY AS NEEDED PRIOR TO SEXUAL ACTIVITY, Disp: 90 tablet, Rfl: 3   SUMAtriptan (IMITREX) 100 MG tablet, Take 100 mg by mouth (1 tablet) at for signs of migraine headache. May take 1 more dose 2 hours later. Do not exceed 200 mg in 24 hours., Disp: , Rfl:    SYMBICORT 160-4.5 MCG/ACT inhaler, Inhale 2 puffs into the lungs in the morning and at bedtime. , Disp: , Rfl:    tamsulosin (FLOMAX) 0.4 MG CAPS capsule, Take 1 capsule (0.4 mg total) by mouth daily as needed., Disp: 15 capsule, Rfl: 0   testosterone cypionate (DEPOTESTOTERONE CYPIONATE) 100 MG/ML injection, Inject 0.75 mg into the muscle every 7 (seven) days. For IM use only, Disp: , Rfl:    Vitamin D, Ergocalciferol, (DRISDOL) 1.25 MG (50000 UNIT) CAPS capsule, Take 1 capsule (50,000 Units total) by mouth every 7 (seven) days., Disp: 15 capsule, Rfl: 0   zolpidem (AMBIEN) 10 MG tablet, TAKE 1 TABLET(10 MG) BY MOUTH AT BEDTIME, Disp: 90 tablet, Rfl: 0   amoxicillin-clavulanate (AUGMENTIN) 875-125 MG tablet, Take 1 tablet by mouth 2 (two) times daily. (Patient not taking: Reported on 09/25/2021), Disp: 14 tablet, Rfl: 0   Garner Nash, DO [facility] 10/09/2021 4:23 PM

## 2021-10-10 ENCOUNTER — Encounter: Payer: Self-pay | Admitting: Pulmonary Disease

## 2021-10-10 DIAGNOSIS — J4550 Severe persistent asthma, uncomplicated: Secondary | ICD-10-CM

## 2021-10-10 DIAGNOSIS — J301 Allergic rhinitis due to pollen: Secondary | ICD-10-CM | POA: Diagnosis not present

## 2021-10-10 DIAGNOSIS — J3089 Other allergic rhinitis: Secondary | ICD-10-CM | POA: Diagnosis not present

## 2021-10-10 NOTE — Telephone Encounter (Signed)
Controlled substance database (PDMP) reviewed. No concerns appreciated.  Last filled 07/14/21 - refill ordered.

## 2021-10-11 MED ORDER — SPACER/AERO-HOLDING CHAMBERS DEVI
0 refills | Status: DC
Start: 2021-10-11 — End: 2022-05-15

## 2021-10-11 NOTE — Telephone Encounter (Signed)
I have sent in a prescription to the pharmacy per patient request.

## 2021-10-12 NOTE — Progress Notes (Addendum)
Called patient and advised that [place] is not considered immunosuppressive therefore receiving monkeypox vaccine while taking Dupixent doesn't necessarily put him at risk for infection.  However Dupixent prescribing information recommends becoming up to date on all LIVE vaccines, therefore I recommended he receive vaccine prior to [place]. Vaccine is two doses given 4 weeks apart and would be able to start [place] 2 weeks after second dose.  He states he has received four prednisone tapers for his asthma and would like to get started on Dupixent as soon as possible. He will be calling health department ASAP to schedule vaccine.  I also advised the patient that there is no correct answer in regards to the vaccine and he could choose to start [place] even 2 weeks after receiving first dose of vaccine if he and his allergist felt comfortable. He states his allergist did not have any clear guidance to provide and would process this information to make decision  Patient expressed understanding  Knox Saliva, PharmD, MPH, BCPS Clinical Pharmacist (Rheumatology and Pulmonology)

## 2021-10-13 DIAGNOSIS — G4730 Sleep apnea, unspecified: Secondary | ICD-10-CM | POA: Diagnosis not present

## 2021-10-13 DIAGNOSIS — E669 Obesity, unspecified: Secondary | ICD-10-CM | POA: Diagnosis not present

## 2021-10-13 DIAGNOSIS — Z23 Encounter for immunization: Secondary | ICD-10-CM | POA: Diagnosis not present

## 2021-10-13 DIAGNOSIS — Z125 Encounter for screening for malignant neoplasm of prostate: Secondary | ICD-10-CM | POA: Diagnosis not present

## 2021-10-13 DIAGNOSIS — E230 Hypopituitarism: Secondary | ICD-10-CM | POA: Diagnosis not present

## 2021-10-20 ENCOUNTER — Other Ambulatory Visit: Payer: Self-pay | Admitting: Family Medicine

## 2021-10-20 ENCOUNTER — Other Ambulatory Visit: Payer: Self-pay

## 2021-10-20 ENCOUNTER — Ambulatory Visit (INDEPENDENT_AMBULATORY_CARE_PROVIDER_SITE_OTHER)
Admission: RE | Admit: 2021-10-20 | Discharge: 2021-10-20 | Disposition: A | Discharge status: Home / Self Care | Payer: BC Managed Care – PPO | Source: Ambulatory Visit | Attending: Pulmonary Disease | Admitting: Pulmonary Disease

## 2021-10-20 DIAGNOSIS — R911 Solitary pulmonary nodule: Secondary | ICD-10-CM | POA: Diagnosis not present

## 2021-10-20 DIAGNOSIS — I700 Atherosclerosis of aorta: Secondary | ICD-10-CM | POA: Diagnosis not present

## 2021-10-20 DIAGNOSIS — F411 Generalized anxiety disorder: Secondary | ICD-10-CM

## 2021-10-20 NOTE — Telephone Encounter (Signed)
Controlled substance database (PDMP) reviewed. No concerns appreciated.  Last filled 10/14 - refill ordered.

## 2021-10-23 DIAGNOSIS — J3089 Other allergic rhinitis: Secondary | ICD-10-CM | POA: Diagnosis not present

## 2021-10-23 DIAGNOSIS — E230 Hypopituitarism: Secondary | ICD-10-CM | POA: Diagnosis not present

## 2021-10-23 DIAGNOSIS — J301 Allergic rhinitis due to pollen: Secondary | ICD-10-CM | POA: Diagnosis not present

## 2021-11-02 ENCOUNTER — Other Ambulatory Visit: Payer: Self-pay | Admitting: Family Medicine

## 2021-11-02 DIAGNOSIS — I10 Essential (primary) hypertension: Secondary | ICD-10-CM

## 2021-11-03 ENCOUNTER — Other Ambulatory Visit: Payer: Self-pay | Admitting: Family Medicine

## 2021-11-03 DIAGNOSIS — G43109 Migraine with aura, not intractable, without status migrainosus: Secondary | ICD-10-CM

## 2021-11-03 NOTE — Telephone Encounter (Signed)
Patient is requesting a refill of the following medications: Requested Prescriptions   Pending Prescriptions Disp Refills   rizatriptan (MAXALT) 10 MG tablet [Pharmacy Med Name: RIZATRIPTAN 10MG  TABLETS] 10 tablet 0    Sig: TAKE 1 TABLET BY MOUTH AS NEEDED FOR MIGRAINE. MAY REPEAT IN 2 HOURS IF NEEDED    Date of patient request: 11/03/21 Last office visit: 09/25/21 Date of last refill: 12/22/20 Last refill amount: 10

## 2021-11-07 DIAGNOSIS — J3089 Other allergic rhinitis: Secondary | ICD-10-CM | POA: Diagnosis not present

## 2021-11-07 DIAGNOSIS — J301 Allergic rhinitis due to pollen: Secondary | ICD-10-CM | POA: Diagnosis not present

## 2021-11-10 DIAGNOSIS — Z23 Encounter for immunization: Secondary | ICD-10-CM | POA: Diagnosis not present

## 2021-11-13 ENCOUNTER — Other Ambulatory Visit (HOSPITAL_COMMUNITY)
Admission: RE | Admit: 2021-11-13 | Discharge: 2021-11-13 | Disposition: A | Discharge status: Home / Self Care | Payer: BC Managed Care – PPO | Source: Ambulatory Visit | Attending: Family Medicine | Admitting: Family Medicine

## 2021-11-13 ENCOUNTER — Encounter: Payer: Self-pay | Admitting: Family Medicine

## 2021-11-13 ENCOUNTER — Ambulatory Visit: Payer: BC Managed Care – PPO | Admitting: Family Medicine

## 2021-11-13 VITALS — BP 130/78 | HR 87 | Temp 98.3°F | Resp 17 | Ht 71.0 in | Wt 260.0 lb

## 2021-11-13 DIAGNOSIS — R238 Other skin changes: Secondary | ICD-10-CM

## 2021-11-13 DIAGNOSIS — Z79899 Other long term (current) drug therapy: Secondary | ICD-10-CM | POA: Insufficient documentation

## 2021-11-13 DIAGNOSIS — F411 Generalized anxiety disorder: Secondary | ICD-10-CM | POA: Diagnosis not present

## 2021-11-13 DIAGNOSIS — Z113 Encounter for screening for infections with a predominantly sexual mode of transmission: Secondary | ICD-10-CM

## 2021-11-13 DIAGNOSIS — Z131 Encounter for screening for diabetes mellitus: Secondary | ICD-10-CM

## 2021-11-13 DIAGNOSIS — I10 Essential (primary) hypertension: Secondary | ICD-10-CM | POA: Diagnosis not present

## 2021-11-13 LAB — COMPREHENSIVE METABOLIC PANEL
ALT: 22 U/L (ref 0–53)
AST: 15 U/L (ref 0–37)
Albumin: 4.5 g/dL (ref 3.5–5.2)
Alkaline Phosphatase: 78 U/L (ref 39–117)
BUN: 14 mg/dL (ref 6–23)
CO2: 24 mEq/L (ref 19–32)
Calcium: 9.4 mg/dL (ref 8.4–10.5)
Chloride: 105 mEq/L (ref 96–112)
Creatinine, Ser: 1.16 mg/dL (ref 0.40–1.50)
GFR: 72.33 mL/min (ref >60.00)
Glucose, Bld: 88 mg/dL (ref 70–99)
Potassium: 4.3 mEq/L (ref 3.5–5.1)
Sodium: 137 mEq/L (ref 135–145)
Total Bilirubin: 0.6 mg/dL (ref 0.2–1.2)
Total Protein: 6.9 g/dL (ref 6.0–8.3)

## 2021-11-13 LAB — HEMOGLOBIN A1C: Hgb A1c MFr Bld: 5.3 % (ref 4.6–6.5)

## 2021-11-13 MED ORDER — EMTRICITABINE-TENOFOVIR DF 200-300 MG PO TABS: 1.0000 | ORAL_TABLET | Freq: Every day | ORAL | 1 refills | Status: DC

## 2021-11-13 MED ORDER — MUPIROCIN 2 % EX OINT: 1.0000 "application " | TOPICAL_OINTMENT | Freq: Two times a day (BID) | CUTANEOUS | 0 refills | Status: DC

## 2021-11-13 NOTE — Patient Instructions (Addendum)
Mupirocin ointment to wound on abdomen.  If new lesions occur, okay to continue covering with bandage until healed.  However if those do continue to recur I would recommend dermatology evaluation.  Let me know.  Will check labs I will let you know if there are any concerns.  No med changes for now.  Thanks for coming in today.  Rash, Adult A rash is a change in the color of your skin. A rash can also change the way your skin feels. There are many different conditions and factors that can cause a rash. Some rashes may disappear after a few days, but some may last for a few weeks. Common causes of rashes include: Viral infections, such as: Colds. Measles. Hand, foot, and mouth disease. Bacterial infections, such as: Scarlet fever. Impetigo. Fungal infections, such as Candida. Allergic reactions to food, medicines, or skin care products. Follow these instructions at home: The goal of treatment is to stop the itching and keep the rash from spreading. Pay attention to any changes in your symptoms. Follow these instructions to help with your condition: Medicine Take or apply over-the-counter and prescription medicines only as told by your health care provider. These may include: Corticosteroid creams to treat red or swollen skin. Anti-itch lotions. Oral allergy medicines (antihistamines). Oral corticosteroids for severe symptoms.  Skin care Apply cool compresses to the affected areas. Do not scratch or rub your skin. Avoid covering the rash. Make sure the rash is exposed to air as much as possible. Managing itching and discomfort Avoid hot showers or baths, which can make itching worse. A cold shower may help. Try taking a bath with: Epsom salts. Follow manufacturer instructions on the packaging. You can get these at your local pharmacy or grocery store. Baking soda. Pour a small amount into the bath as told by your health care provider. Colloidal oatmeal. Follow manufacturer  instructions on the packaging. You can get this at your local pharmacy or grocery store. Try applying baking soda paste to your skin. Stir water into baking soda until it reaches a paste-like consistency. Try applying calamine lotion. This is an over-the-counter lotion that helps to relieve itchiness. Keep cool and out of the sun. Sweating and being hot can make itching worse. General instructions  Rest as needed. Drink enough fluid to keep your urine pale yellow. Wear loose-fitting clothing. Avoid scented soaps, detergents, and perfumes. Use gentle soaps, detergents, perfumes, and other cosmetic products. Avoid any substance that causes your rash. Keep a journal to help track what causes your rash. Write down: What you eat. What cosmetic products you use. What you drink. What you wear. This includes jewelry. Keep all follow-up visits as told by your health care provider. This is important. Contact a health care provider if: You sweat at night. You lose weight. You urinate more than normal. You urinate less than normal, or you notice that your urine is a darker color than usual. You feel weak. You vomit. Your skin or the whites of your eyes look yellow (jaundice). Your skin: Tingles. Is numb. Your rash: Does not go away after several days. Gets worse. You are: Unusually thirsty. More tired than normal. You have: New symptoms. Pain in your abdomen. A fever. Diarrhea. Get help right away if you: Have a fever and your symptoms suddenly get worse. Develop confusion. Have a severe headache or a stiff neck. Have severe joint pains or stiffness. Have a seizure. Develop a rash that covers all or most of your body. The  rash may or may not be painful. Develop blisters that: Are on top of the rash. Grow larger or grow together. Are painful. Are inside your nose or mouth. Develop a rash that: Looks like purple pinprick-sized spots all over your body. Has a "bull's eye" or  looks like a target. Is not related to sun exposure, is red and painful, and causes your skin to peel. Summary A rash is a change in the color of your skin. Some rashes disappear after a few days, but some may last for a few weeks. The goal of treatment is to stop the itching and keep the rash from spreading. Take or apply over-the-counter and prescription medicines only as told by your health care provider. Contact a health care provider if you have new or worsening symptoms. Keep all follow-up visits as told by your health care provider. This is important. This information is not intended to replace advice given to you by your health care provider. Make sure you discuss any questions you have with your health care provider. Document Revised: 02/13/2019 Document Reviewed: 05/26/2018 Elsevier Patient Education  2022 [employer].

## 2021-11-13 NOTE — Progress Notes (Signed)
Subjective:  Patient ID: Randall Apple., male    DOB: 1970-11-10  Age: 51 y.o. MRN: [id number]  CC:  Chief Complaint  Patient presents with   Anxiety    Pt in need of refill xanax next week, ambien due in February, no other concerns    pre exposure    Pt in need of refill truvada    Hypertension    Doing okay, no concerns, no physical sxs   bumps    Pt reports a bump /blister pop up different areas over the last month and half or 2 months, some itching, puts bandaid over site to protect typically     HPI Randall Reed. presents for   Anxiety/generalized anxiety disorder See prior notes.  Unable to tolerate multiple different SSRI trials.  Has been well controlled with alprazolam 1 to 3/day depending on stress with consistent use.  He has been able to decrease dosing as tolerated.  Has also required Ambien 10 mg every afternoon, not combine with Xanax, stable control of insomnia without recent new side effects.  Controlled substance database reviewed. Alprazolam No. 60 on 10/20/2021, Ambien No. 90 on 10/10/2021. Current use - 1.5-2 per day now. Some days not needed at all, 3 at the max.  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  Sleep going well with ambien - no parasomnias.   Preexposure prophylaxis Takes Truvada,1 new sexual contacts since last testing - unprotected. .  No new side effects. Lab Results  Component Value Date   CREATININE 1.11 09/25/2021   Hypertension: Losartan, Toprol, Cardizem.  Cardiology [name].  No new med side effects. Home readings: none.  No new side effects , no new CP/dyspnea - underlying asthma.  BP Readings from Last 3 Encounters:  11/13/21 130/78  10/09/21 126/76  09/25/21 128/70   Lab Results  Component Value Date   CREATININE 1.11 09/25/2021   Rash: Small blister/heat blister - noted few on R leg, come and go -resolve on own in few days. Past month. One on abdomen past week - lasting longer. Slight  itching.  No arm or inderdigital lesions, no oral/genital lesions.  Tx: bandaid.  No new derm products, detergent, fabric softener.  Monkeypox vaccine - initial 10/13/21, 11/10/21.  No recent sexual contact - Sept 2022.     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[facility]) 06/04/2014   rotator cuff tear 10/17/2013   Hypogonadism male 09/04/2013   ED (erectile dysfunction) 07/30/2013   Palpitations 02/21/2012   BMI 33.0-33.9,adult 02/04/2012   Allergic rhinitis 02/04/2012   GERD (gastroesophageal reflux disease) 02/04/2012   Migraine 02/04/2012   GAD (generalized anxiety disorder) 02/04/2012   Insomnia 02/04/2012   Essential hypertension 04/13/2009   CHEST PAIN-UNSPECIFIED 04/13/2009   Past Medical History:  Diagnosis Date   Anxiety    Arthritis    Asthma    Depression    GERD (gastroesophageal reflux disease)    Hyperlipidemia    Phreesia 12/05/2020   Seasonal allergic reaction    Past Surgical History:  Procedure Laterality Date   LIPOMA EXCISION N/A 10/30/2018   Procedure: EXCISION OF MULTIPLE SUBCUTANEOUS LIPOMAS ON TORSO;  Surgeon: Donnie Mesa, MD;  Location: [facility];  Service: General;  Laterality: N/A;  none     none     Allergies  Allergen Reactions   Desloratadine Other (See Comments)    CLARINEX-"severe headache"   Loratadine Other (See Comments)    "severe headache"   Other    Levbid [Hyoscyamine Sulfate] Rash   Telithromycin Rash   Prior to Admission medications   Medication Sig Start Date End Date Taking? Authorizing Provider  acetic acid-hydrocortisone (VOSOL-HC) otic solution Place 3 drops into both ears 3 (three) times daily. Use as needed to prevent swimmer's ear 01/10/15  Yes Leandrew Koyanagi, MD   albuterol (VENTOLIN HFA) 108 (90 Base) MCG/ACT inhaler INHALE 2 PUFFS INTO THE LUNGS EVERY 6 HOURS AS NEEDED FOR WHEEZING OR SHORTNESS OF BREATH 07/19/20  Yes [name], MD  [facility] ALLERGY 180 MG tablet Take 180 mg by mouth daily. 07/12/20  Yes [provider]  ALPRAZolam [name]) 1 MG tablet TAKE 1 TABLET(1 MG) BY MOUTH TWICE DAILY AS NEEDED 10/20/21  Yes [name], MD  aspirin EC 81 MG tablet Take 1 tablet (81 mg total) by mouth daily. 03/07/15  Yes Sherren Mocha, MD  Budeson-Glycopyrrol-Formoterol (BREZTRI AEROSPHERE) 160-9-4.8 MCG/ACT AERO  09/14/21  Yes [provider]  cyclobenzaprine (FLEXERIL) 10 MG tablet Take 10 mg by mouth 3 (three) times daily as needed for muscle spasms.  04/08/20  Yes [provider]  Diclofenac Sodium (PENNSAID) 2 % SOLN Apply topically as needed.  04/08/20  Yes [provider]  dicyclomine (BENTYL) 20 MG tablet Take 20 mg by mouth 2 (two) times a day.   Yes [provider]  diltiazem (CARDIZEM CD) 180 MG 24 hr capsule TAKE 1 CAPSULE(180 MG) BY MOUTH DAILY 03/23/21  Yes Sherren Mocha, MD  doxycycline (VIBRAMYCIN) 100 MG capsule Take 100 mg by mouth 2 (two) times daily. 09/05/21  Yes [provider]  DUPIXENT 300 MG/2ML prefilled syringe Inject into the skin. 10/06/21  Yes [provider]  emtricitabine-tenofovir (TRUVADA) 200-300 MG tablet Take 1 tablet by mouth daily. 05/25/21  Yes [name], MD  emtricitabine-tenofovir (TRUVADA) 200-300 MG tablet Take 1 tablet by mouth daily. 08/18/21  Yes [name], MD  famotidine (PEPCID) 40 MG tablet Take 40 mg by mouth at bedtime.  02/18/20  Yes [provider]  Fexofenadine HCl (ALLEGRA PO) Take 1 tablet by mouth daily.    Yes [provider]  fluticasone (FLONASE) 50 MCG/ACT nasal spray Place 2 sprays into both nostrils daily. 07/12/20  Yes [provider]  hydrocortisone (ANUSOL-HC) 2.5 % rectal cream APPLY RECTALLY  TWICE DAILY 06/30/20  Yes [name], MD  ipratropium (ATROVENT) 0.06 % nasal spray Place 2 sprays into the nose 2 (two) times daily. 05/25/21  Yes [name], MD  losartan (COZAAR) 25 MG tablet TAKE 1 TABLET(25 MG) BY MOUTH DAILY 11/03/21  Yes [name], MD  metoprolol succinate (TOPROL-XL) 25 MG 24 hr tablet TAKE 1 TABLET(25 MG) BY MOUTH AT BEDTIME 05/24/21  Yes Sherren Mocha, MD  montelukast (SINGULAIR) 10 MG tablet TAKE 1 TABLET(10 MG) BY MOUTH AT BEDTIME 05/25/21  Yes [name], MD  mupirocin ointment (BACTROBAN) 2 % SMARTSIG:1 Application Topical 2-3 Times Daily 08/23/20  Yes [provider]  Olopatadine-Mometasone ([name]) [phone] MCG/ACT SUSP Place into the nose.   Yes [provider]  ondansetron (ZOFRAN-ODT) 4 MG disintegrating tablet Take 4 mg by mouth every 8 (eight) hours as needed for nausea or vomiting.   Yes [provider]  pantoprazole (  PROTONIX) 40 MG tablet Take 40 mg by mouth 2 (two) times daily.  04/08/20  Yes [provider]  Potassium Chloride ER 20 MEQ TBCR TAKE 1 TABLET BY MOUTH DAILY 08/24/21  Yes Sherren Mocha, MD  pramoxine-hydrocortisone (PROCTOCREAM-HC) 1-1 % rectal cream APPLY TO AFFECTED AREA THREE TIMES DAILY   Yes [provider]  predniSONE (DELTASONE) 20 MG tablet Take 2 tablets (40 mg total) by mouth daily with breakfast. Patient taking differently: Take 20 mg by mouth daily with breakfast. 07/21/21  Yes [name], MD  rizatriptan (MAXALT) 10 MG tablet TAKE 1 TABLET BY MOUTH AS NEEDED FOR MIGRAINE. MAY REPEAT IN 2 HOURS IF NEEDED 11/03/21  Yes [name], MD  rosuvastatin (CRESTOR) 10 MG tablet TAKE 1 TABLET(10 MG) BY MOUTH DAILY 03/23/21  Yes Sherren Mocha, MD  sildenafil (VIAGRA) 100 MG tablet TAKE 1/2 TO 1 TABLET BY MOUTH ONCE DAILY AS NEEDED PRIOR TO SEXUAL ACTIVITY 03/23/21  Yes Sherren Mocha, MD  Spacer/Aero-Holding Josiah Lobo DEVI Use with inhaler 10/11/21  Yes Icard,  Octavio Graves, DO  SUMAtriptan (IMITREX) 100 MG tablet Take 100 mg by mouth (1 tablet) at for signs of migraine headache. May take 1 more dose 2 hours later. Do not exceed 200 mg in 24 hours. 12/20/20  Yes [provider]  tamsulosin (FLOMAX) 0.4 MG CAPS capsule Take 1 capsule (0.4 mg total) by mouth daily as needed. 08/17/21  Yes [name], MD  testosterone cypionate (DEPOTESTOTERONE CYPIONATE) 100 MG/ML injection Inject 0.75 mg into the muscle every 7 (seven) days. For IM use only   Yes [provider]  Vitamin D, Ergocalciferol, (DRISDOL) 1.25 MG (50000 UNIT) CAPS capsule Take 1 capsule (50,000 Units total) by mouth every 7 (seven) days. 10/06/21  Yes [name], MD  zolpidem (AMBIEN) 10 MG tablet TAKE 1 TABLET(10 MG) BY MOUTH AT BEDTIME 10/10/21  Yes [name], MD  SYMBICORT 160-4.5 MCG/ACT inhaler Inhale 2 puffs into the lungs in the morning and at bedtime.  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)  Tobacco Use   Smoking status: Never   Smokeless tobacco: Never  Vaping Use   Vaping Use: Never used  Substance and Sexual Activity   Alcohol use: Yes    Alcohol/week: 3.0 standard drinks    Types: 3 Standard drinks or equivalent per week   Drug use: No   Sexual activity: Yes  Other Topics Concern   Not on file  Social History Narrative   Not on file   Social Determinants of Health   Financial Resource Strain: Not on file  Food Insecurity: Not on file  Transportation Needs: Not on file  Physical Activity: Not on file  Stress: Not on file  Social Connections: Not on file  Intimate Partner Violence: Not on file    Review of Systems Per  HPI.   Objective:   Vitals:   11/13/21 1039  BP: 130/78  Pulse: 87  Resp: 17  Temp: 98.3 F (36.8 C)  TempSrc: Temporal  SpO2: 95%  Weight: 260 lb (117.9 kg)  Height: 5\' 11"  (1.803 m)     Physical Exam Vitals  reviewed.  Constitutional:      Appearance: He is well-developed.  HENT:     Head: Normocephalic and atraumatic.  Neck:     Vascular: No carotid bruit or JVD.  Cardiovascular:     Rate and Rhythm: Normal rate and regular rhythm.     Heart sounds: Normal heart sounds. No murmur heard. Pulmonary:     Effort: Pulmonary effort is normal.     Breath sounds: Normal breath sounds. No rales.  Musculoskeletal:     Right lower leg: No edema.     Left lower leg: No edema.  Skin:    General: Skin is warm and dry.       Neurological:     Mental Status: He is alert and oriented to person, place, and time.  Psychiatric:        Mood and Affect: Mood normal.         Assessment & Plan:  Randall Pauwels. is a 51 y.o. male . Vesicular rash - Plan: mupirocin ointment (BACTROBAN) 2 %  -Differential included shingles but unlikely with intermittent symptoms over the course of the month, less likely herpetic given distribution but herpes gladiatorum possible.  Unlikely monkeypox.  Contact dermatitis/local reaction possible.  Healing wound on abdomen with some overlying eschar, slight erythema.  -Keep any new wounds clean, covered, mupirocin ointment to abdominal wound or other open wounds, RTC precautions if persistent or new wounds, consider Derm eval.  Routine screening for STI (sexually transmitted infection) - Plan: emtricitabine-tenofovir (TRUVADA) 200-300 MG tablet, Urine cytology ancillary only, HIV Antibody (routine testing w rflx), RPR On pre-exposure prophylaxis for HIV - Plan: emtricitabine-tenofovir (TRUVADA) 200-300 MG tablet, Urine cytology ancillary only, HIV Antibody (routine testing w rflx), RPR  -Check STI screening, safer sex practices discussed, continue  Truvada.  Recent creatinine noted as above  GAD (generalized anxiety disorder)  -Stable with dosing of alprazolam, Ambien as above, no recent increased use or new side effects/intolerances.  No parasomnias  Screening for diabetes mellitus - Plan: Hemoglobin A1c  Essential hypertension - Plan: Comprehensive metabolic panel  -Overall stable on current regimen.  No changes.  Meds ordered this encounter  Medications   emtricitabine-tenofovir (TRUVADA) 200-300 MG tablet    Sig: Take 1 tablet by mouth daily.    Dispense:  90 tablet    Refill:  1   mupirocin ointment (BACTROBAN) 2 %    Sig: Apply 1 application topically 2 (two) times daily.    Dispense:  22 g    Refill:  0   Patient Instructions   Mupirocin ointment to wound on abdomen.  If new lesions occur, okay to continue covering with bandage until healed.  However if those do continue to recur I would recommend dermatology evaluation.  Let me know.  Will check labs I will let you know if there are any concerns.  No med changes for now.  Thanks for coming in today.  Rash, Adult A rash is a change in the color of your skin. A rash can also change the way your skin feels. There are many different conditions and factors that can cause a rash. Some rashes may disappear after a few days, but some may last for a few weeks. Common causes of rashes include: Viral infections, such as: Colds. Measles. Hand, foot, and mouth disease. Bacterial infections, such as: Scarlet fever. Impetigo. Fungal infections, such as Candida. Allergic reactions to food, medicines, or skin care products. Follow these instructions at home: The goal of  treatment is to stop the itching and keep the rash from spreading. Pay attention to any changes in your symptoms. Follow these instructions to help with your condition: Medicine Take or apply over-the-counter and prescription medicines only as told by your health care provider. These may  include: Corticosteroid creams to treat red or swollen skin. Anti-itch lotions. Oral allergy medicines (antihistamines). Oral corticosteroids for severe symptoms.  Skin care Apply cool compresses to the affected areas. Do not scratch or rub your skin. Avoid covering the rash. Make sure the rash is exposed to air as much as possible. Managing itching and discomfort Avoid hot showers or baths, which can make itching worse. A cold shower may help. Try taking a bath with: Epsom salts. Follow manufacturer instructions on the packaging. You can get these at your local pharmacy or grocery store. Baking soda. Pour a small amount into the bath as told by your health care provider. Colloidal oatmeal. Follow manufacturer instructions on the packaging. You can get this at your local pharmacy or grocery store. Try applying baking soda paste to your skin. Stir water into baking soda until it reaches a paste-like consistency. Try applying calamine lotion. This is an over-the-counter lotion that helps to relieve itchiness. Keep cool and out of the sun. Sweating and being hot can make itching worse. General instructions  Rest as needed. Drink enough fluid to keep your urine pale yellow. Wear loose-fitting clothing. Avoid scented soaps, detergents, and perfumes. Use gentle soaps, detergents, perfumes, and other cosmetic products. Avoid any substance that causes your rash. Keep a journal to help track what causes your rash. Write down: What you eat. What cosmetic products you use. What you drink. What you wear. This includes jewelry. Keep all follow-up visits as told by your health care provider. This is important. Contact a health care provider if: You sweat at night. You lose weight. You urinate more than normal. You urinate less than normal, or you notice that your urine is a darker color than usual. You feel weak. You vomit. Your skin or the whites of your eyes look yellow (jaundice). Your  skin: Tingles. Is numb. Your rash: Does not go away after several days. Gets worse. You are: Unusually thirsty. More tired than normal. You have: New symptoms. Pain in your abdomen. A fever. Diarrhea. Get help right away if you: Have a fever and your symptoms suddenly get worse. Develop confusion. Have a severe headache or a stiff neck. Have severe joint pains or stiffness. Have a seizure. Develop a rash that covers all or most of your body. The rash may or may not be painful. Develop blisters that: Are on top of the rash. Grow larger or grow together. Are painful. Are inside your nose or mouth. Develop a rash that: Looks like purple pinprick-sized spots all over your body. Has a "bull's eye" or looks like a target. Is not related to sun exposure, is red and painful, and causes your skin to peel. Summary A rash is a change in the color of your skin. Some rashes disappear after a few days, but some may last for a few weeks. The goal of treatment is to stop the itching and keep the rash from spreading. Take or apply over-the-counter and prescription medicines only as told by your health care provider. Contact a health care provider if you have new or worsening symptoms. Keep all follow-up visits as told by your health care provider. This is important. This information is not intended to replace advice  given to you by your health care provider. Make sure you discuss any questions you have with your health care provider. Document Revised: 02/13/2019 Document Reviewed: 05/26/2018 Elsevier Patient Education  2022 [place],   [name], MD [place] 11/13/21 1:42 PM

## 2021-11-14 LAB — RPR: RPR Ser Ql: NONREACTIVE

## 2021-11-14 LAB — HIV ANTIBODY (ROUTINE TESTING W REFLEX): HIV 1&2 Ab, 4th Generation: NONREACTIVE

## 2021-11-15 LAB — URINE CYTOLOGY ANCILLARY ONLY
Chlamydia: NEGATIVE
Comment: NEGATIVE
Comment: NEGATIVE
Comment: NORMAL
Neisseria Gonorrhea: NEGATIVE
Trichomonas: NEGATIVE

## 2021-11-21 DIAGNOSIS — J3089 Other allergic rhinitis: Secondary | ICD-10-CM | POA: Diagnosis not present

## 2021-11-21 DIAGNOSIS — J301 Allergic rhinitis due to pollen: Secondary | ICD-10-CM | POA: Diagnosis not present

## 2021-11-23 NOTE — Progress Notes (Signed)
Please let patient know that I have reviewed his CT chest. No nodule present. Some basilar changes consistent with atelectasis. Nothing to follow up on at this time. He does have some coronary calcifications and can see his PCP for follow up.   Thanks,  BLI  Garner Nash, DO [facility] 11/23/2021 7:29 AM

## 2021-11-29 ENCOUNTER — Other Ambulatory Visit: Payer: Self-pay | Admitting: Family Medicine

## 2021-11-29 DIAGNOSIS — F411 Generalized anxiety disorder: Secondary | ICD-10-CM

## 2021-11-29 NOTE — Telephone Encounter (Signed)
Patient is requesting a refill of the following medications: Requested Prescriptions   Pending Prescriptions Disp Refills   ALPRAZolam (XANAX) 1 MG tablet [Pharmacy Med Name: ALPRAZOLAM 1MG  TABLETS] 60 tablet     Sig: TAKE 1 TABLET(1 MG) BY MOUTH TWICE DAILY AS NEEDED    Date of patient request: 11/29/2021 Last office visit: 11/13/2021 Date of last refill: 10/20/2021 Last refill amount: 60 tablets Follow up time period per chart: 05/14/2022

## 2021-11-29 NOTE — Telephone Encounter (Signed)
Controlled substance database (PDMP) reviewed. No concerns appreciated.  Medications have been discussed at recent visit. Last filled 10/20/21. Refill ordered.

## 2021-12-04 NOTE — Progress Notes (Signed)
Tried calling the pt and there was no answer- LMTCB.  

## 2021-12-05 ENCOUNTER — Telehealth: Payer: Self-pay | Admitting: Pulmonary Disease

## 2021-12-05 ENCOUNTER — Encounter: Payer: Self-pay | Admitting: Cardiovascular Disease

## 2021-12-05 NOTE — Telephone Encounter (Signed)
Thx Randall Reed. He has known coronary calcification from coronary CTA from 2019. Should just continue on aggressive risk reduction/cholesterol lowering/BP management/etc. Keep scheduled follow-up. thx

## 2021-12-05 NOTE — Telephone Encounter (Signed)
Randall Nash, DO  11/23/2021  8:01 AM EST     Please let patient know that I have reviewed his CT chest. No nodule present. Some basilar changes consistent with atelectasis. Nothing to follow up on at this time. He does have some coronary calcifications and can see his PCP for follow up.    Thanks,   BLI   Randall Nash, DO [facility] 11/23/2021 7:29 AM    Patient is aware of results and voiced his understanding.  He is concerned about the atelectasis because he has bottom right side back pain with coughing. He is concerned that this is related to atelectasis.   Dr. Valeta Harms, please advise. Thanks

## 2021-12-05 NOTE — Telephone Encounter (Signed)
Lm for patient.  

## 2021-12-06 DIAGNOSIS — J301 Allergic rhinitis due to pollen: Secondary | ICD-10-CM | POA: Diagnosis not present

## 2021-12-06 DIAGNOSIS — J3089 Other allergic rhinitis: Secondary | ICD-10-CM | POA: Diagnosis not present

## 2021-12-06 NOTE — Telephone Encounter (Signed)
I called the pt and left detailed msg on machine ok per DPR with BI's response. Will close encounter.

## 2021-12-08 ENCOUNTER — Encounter: Payer: Self-pay | Admitting: Family Medicine

## 2021-12-08 MED ORDER — TAMSULOSIN HCL 0.4 MG PO CAPS: 0.4000 mg | ORAL_CAPSULE | Freq: Every day | ORAL | 0 refills | Status: DC | PRN

## 2021-12-08 NOTE — Telephone Encounter (Signed)
Pt is asking about doing short course flomax as he has been struggling to start urine stream recently, would you prefer a visit to discuss this? Last visit 11/13/21

## 2021-12-19 DIAGNOSIS — J019 Acute sinusitis, unspecified: Secondary | ICD-10-CM | POA: Diagnosis not present

## 2021-12-19 DIAGNOSIS — J3089 Other allergic rhinitis: Secondary | ICD-10-CM | POA: Diagnosis not present

## 2021-12-19 DIAGNOSIS — J4550 Severe persistent asthma, uncomplicated: Secondary | ICD-10-CM | POA: Diagnosis not present

## 2021-12-19 DIAGNOSIS — J301 Allergic rhinitis due to pollen: Secondary | ICD-10-CM | POA: Diagnosis not present

## 2021-12-25 DIAGNOSIS — R1032 Left lower quadrant pain: Secondary | ICD-10-CM | POA: Diagnosis not present

## 2021-12-25 DIAGNOSIS — K649 Unspecified hemorrhoids: Secondary | ICD-10-CM | POA: Diagnosis not present

## 2021-12-25 DIAGNOSIS — K5790 Diverticulosis of intestine, part unspecified, without perforation or abscess without bleeding: Secondary | ICD-10-CM | POA: Diagnosis not present

## 2021-12-25 DIAGNOSIS — K219 Gastro-esophageal reflux disease without esophagitis: Secondary | ICD-10-CM | POA: Diagnosis not present

## 2022-01-02 DIAGNOSIS — J301 Allergic rhinitis due to pollen: Secondary | ICD-10-CM | POA: Diagnosis not present

## 2022-01-02 DIAGNOSIS — J3089 Other allergic rhinitis: Secondary | ICD-10-CM | POA: Diagnosis not present

## 2022-01-09 ENCOUNTER — Other Ambulatory Visit: Payer: Self-pay | Admitting: Family Medicine

## 2022-01-09 DIAGNOSIS — F5104 Psychophysiologic insomnia: Secondary | ICD-10-CM

## 2022-01-09 DIAGNOSIS — F411 Generalized anxiety disorder: Secondary | ICD-10-CM

## 2022-01-09 NOTE — Telephone Encounter (Signed)
Patient is requesting a refill of the following medications: ?Requested Prescriptions  ? ?Pending Prescriptions Disp Refills  ? zolpidem (AMBIEN) 10 MG tablet [Pharmacy Med Name: ZOLPIDEM '10MG'$  TABLETS] 90 tablet   ?  Sig: TAKE 1 TABLET(10 MG) BY MOUTH AT BEDTIME  ? ALPRAZolam (XANAX) 1 MG tablet [Pharmacy Med Name: ALPRAZOLAM '1MG'$  TABLETS] 60 tablet   ?  Sig: TAKE 1 TABLET(1 MG) BY MOUTH TWICE DAILY AS NEEDED  ? ? ?Date of patient request: 01/09/22 ?Last office visit: 11/29/21 ?Date of last refill: 11/29/21, 10/10/21 ?Last refill amount: 90 and 60 ? ?

## 2022-01-09 NOTE — Telephone Encounter (Signed)
Controlled substance database reviewed.  Last alprazolam prescription for #60 on 11/29/2021, zolpidem #90 on 10/10/2021.  Refills ordered ?

## 2022-01-22 DIAGNOSIS — J301 Allergic rhinitis due to pollen: Secondary | ICD-10-CM | POA: Diagnosis not present

## 2022-01-22 DIAGNOSIS — J3089 Other allergic rhinitis: Secondary | ICD-10-CM | POA: Diagnosis not present

## 2022-01-25 ENCOUNTER — Ambulatory Visit: Payer: BC Managed Care – PPO | Admitting: Cardiovascular Disease

## 2022-01-28 ENCOUNTER — Ambulatory Visit: Admit: 2022-01-28 | Discharge status: Absent without leave | Payer: BC Managed Care – PPO

## 2022-01-28 DIAGNOSIS — R051 Acute cough: Secondary | ICD-10-CM | POA: Diagnosis not present

## 2022-01-28 DIAGNOSIS — Z20822 Contact with and (suspected) exposure to covid-19: Secondary | ICD-10-CM | POA: Diagnosis not present

## 2022-01-28 DIAGNOSIS — J0140 Acute pansinusitis, unspecified: Secondary | ICD-10-CM | POA: Diagnosis not present

## 2022-01-28 DIAGNOSIS — R0602 Shortness of breath: Secondary | ICD-10-CM | POA: Diagnosis not present

## 2022-01-28 DIAGNOSIS — Z03818 Encounter for observation for suspected exposure to other biological agents ruled out: Secondary | ICD-10-CM | POA: Diagnosis not present

## 2022-01-29 ENCOUNTER — Encounter: Payer: Self-pay | Admitting: Cardiovascular Disease

## 2022-01-30 ENCOUNTER — Ambulatory Visit (INDEPENDENT_AMBULATORY_CARE_PROVIDER_SITE_OTHER): Payer: BC Managed Care – PPO

## 2022-01-30 ENCOUNTER — Other Ambulatory Visit: Payer: Self-pay

## 2022-01-30 ENCOUNTER — Encounter: Payer: Self-pay | Admitting: Cardiovascular Disease

## 2022-01-30 ENCOUNTER — Ambulatory Visit: Payer: BC Managed Care – PPO | Admitting: Cardiovascular Disease

## 2022-01-30 VITALS — BP 120/82 | HR 85 | Ht 71.0 in | Wt 260.8 lb

## 2022-01-30 DIAGNOSIS — I2510 Atherosclerotic heart disease of native coronary artery without angina pectoris: Secondary | ICD-10-CM

## 2022-01-30 DIAGNOSIS — E782 Mixed hyperlipidemia: Secondary | ICD-10-CM

## 2022-01-30 DIAGNOSIS — R55 Syncope and collapse: Secondary | ICD-10-CM | POA: Diagnosis not present

## 2022-01-30 DIAGNOSIS — I479 Paroxysmal tachycardia, unspecified: Secondary | ICD-10-CM

## 2022-01-30 DIAGNOSIS — I10 Essential (primary) hypertension: Secondary | ICD-10-CM | POA: Diagnosis not present

## 2022-01-30 NOTE — Progress Notes (Unsigned)
Enrolled patient for a 3 day ZIo XT monitor to be mailed to patients home  

## 2022-01-30 NOTE — Progress Notes (Signed)
Cardiology Office Note:    Date:  01/30/2022   ID:  Randall Reed., DOB 1971-08-21, MRN [id number]  PCP:  Randall Flood, MD   [facility] Providers Cardiologist:  Randall Bollman, MD     Referring MD: Randall Flood, MD   Chief Complaint  Patient presents with   Hyperlipidemia    History of Present Illness:    Randall Reed. is a 51 y.o. male with a hx of coronary obesity, hypertension, nonobstructive coronary artery disease found on gated coronary CT scanning, and tachycardia, presenting for follow-up evaluation.  The patient is here alone today.  He has been having a lot of problems with asthma and allergy and has been on multiple rounds of steroids.  He attributes his weight gain to the recurrent use of prednisone.  From a cardiac perspective, he continues to have episodes of elevated heart rate at times.  If his heart rate is above 150 bpm with exercise he feels very poorly.  He had 1 event where he did vigorous exercise and then later got up out of his car and had a syncopal episode.  He had no real prodrome to this.  There was a postural component as he had been sitting in his car and then stood up just before having syncope.  He did not have any major injury at the time.  He has had no chest pain or pressure.  No dyspnea or other current complaints.  Past Medical History:  Diagnosis Date   Anxiety    Arthritis    Asthma    Depression    GERD (gastroesophageal reflux disease)    Hyperlipidemia    Phreesia 12/05/2020   Seasonal allergic reaction     Past Surgical History:  Procedure Laterality Date   LIPOMA EXCISION N/A 10/30/2018   Procedure: EXCISION OF MULTIPLE SUBCUTANEOUS LIPOMAS ON TORSO;  Surgeon: Manus Rudd, MD;  Location: [facility];  Service: General;  Laterality: N/A;   none     none      Current Medications: Current Meds  Medication Sig   acetic acid-hydrocortisone (VOSOL-HC) otic solution Place 3 drops into both ears  3 (three) times daily. Use as needed to prevent swimmer's ear   albuterol (VENTOLIN HFA) 108 (90 Base) MCG/ACT inhaler INHALE 2 PUFFS INTO THE LUNGS EVERY 6 HOURS AS NEEDED FOR WHEEZING OR SHORTNESS OF BREATH   ALLEGRA ALLERGY 180 MG tablet Take 180 mg by mouth daily.   ALPRAZolam (XANAX) 1 MG tablet TAKE 1 TABLET(1 MG) BY MOUTH TWICE DAILY AS NEEDED   amoxicillin-clavulanate (AUGMENTIN) 875-125 MG tablet Take by mouth.   aspirin EC 81 MG tablet Take 1 tablet (81 mg total) by mouth daily.   benzonatate (TESSALON) 100 MG capsule Take 100-200 mg by mouth every 8 (eight) hours as needed.   Budeson-Glycopyrrol-Formoterol (BREZTRI AEROSPHERE) 160-9-4.8 MCG/ACT AERO    cyclobenzaprine (FLEXERIL) 10 MG tablet Take 10 mg by mouth 3 (three) times daily as needed for muscle spasms.    Diclofenac Sodium (PENNSAID) 2 % SOLN Apply topically as needed.    dicyclomine (BENTYL) 20 MG tablet Take 20 mg by mouth 2 (two) times a day.   diltiazem (CARDIZEM CD) 180 MG 24 hr capsule TAKE 1 CAPSULE(180 MG) BY MOUTH DAILY   doxycycline (VIBRAMYCIN) 100 MG capsule Take 100 mg by mouth 2 (two) times daily.   DUPIXENT 300 MG/2ML prefilled syringe Inject into the skin.   emtricitabine-tenofovir (TRUVADA) 200-300 MG tablet Take 1  tablet by mouth daily.   famotidine (PEPCID) 40 MG tablet Take 40 mg by mouth at bedtime.    Fexofenadine HCl (ALLEGRA PO) Take 1 tablet by mouth daily.    fluticasone (FLONASE) 50 MCG/ACT nasal spray Place 2 sprays into both nostrils daily.   hydrocortisone (ANUSOL-HC) 2.5 % rectal cream APPLY RECTALLY TWICE DAILY   ipratropium (ATROVENT) 0.06 % nasal spray Place 2 sprays into the nose 2 (two) times daily.   ipratropium-albuterol (DUONEB) 0.5-2.5 (3) MG/3ML SOLN USE 1 VIAL VIA NEBULIZER EVERY 4 TO 6 HOURS AS NEEDED FOR COUGH OR WHEEZING OR SHORTNESS OF BREATH OR CHEST TIGHTNESS   losartan (COZAAR) 25 MG tablet TAKE 1 TABLET(25 MG) BY MOUTH DAILY   metoprolol succinate (TOPROL-XL) 25 MG 24 hr  tablet TAKE 1 TABLET(25 MG) BY MOUTH AT BEDTIME   montelukast (SINGULAIR) 10 MG tablet TAKE 1 TABLET(10 MG) BY MOUTH AT BEDTIME   mupirocin ointment (BACTROBAN) 2 % Apply 1 application topically 2 (two) times daily.   Olopatadine-Mometasone ([name]) X543819 MCG/ACT SUSP Place into the nose.   ondansetron (ZOFRAN-ODT) 4 MG disintegrating tablet Take 4 mg by mouth every 8 (eight) hours as needed for nausea or vomiting.   pantoprazole (PROTONIX) 40 MG tablet Take 40 mg by mouth 2 (two) times daily.    Potassium Chloride ER 20 MEQ TBCR TAKE 1 TABLET BY MOUTH DAILY   pramoxine-hydrocortisone (PROCTOCREAM-HC) 1-1 % rectal cream APPLY TO AFFECTED AREA THREE TIMES DAILY   predniSONE (DELTASONE) 20 MG tablet Take 2 tablets (40 mg total) by mouth daily with breakfast. (Patient taking differently: Take 20 mg by mouth daily with breakfast. Per patient taking 60 mg today, decreasing 40 mg 4 days until gone.)   rizatriptan (MAXALT) 10 MG tablet TAKE 1 TABLET BY MOUTH AS NEEDED FOR MIGRAINE. MAY REPEAT IN 2 HOURS IF NEEDED   rosuvastatin (CRESTOR) 10 MG tablet TAKE 1 TABLET(10 MG) BY MOUTH DAILY   sildenafil (VIAGRA) 100 MG tablet TAKE 1/2 TO 1 TABLET BY MOUTH ONCE DAILY AS NEEDED PRIOR TO SEXUAL ACTIVITY   Spacer/Aero-Holding Deretha Emory DEVI Use with inhaler   SUMAtriptan (IMITREX) 100 MG tablet Take 100 mg by mouth (1 tablet) at for signs of migraine headache. May take 1 more dose 2 hours later. Do not exceed 200 mg in 24 hours.   tamsulosin (FLOMAX) 0.4 MG CAPS capsule Take 1 capsule (0.4 mg total) by mouth daily as needed.   testosterone cypionate (DEPOTESTOTERONE CYPIONATE) 100 MG/ML injection Inject 0.75 mg into the muscle every 7 (seven) days. For IM use only   Vitamin D, Ergocalciferol, (DRISDOL) 1.25 MG (50000 UNIT) CAPS capsule Take 1 capsule (50,000 Units total) by mouth every 7 (seven) days.   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)  Tobacco Use   Smoking status: Never   Smokeless tobacco: Never  Vaping Use   Vaping Use: Never used  Substance and Sexual Activity   Alcohol use: Yes    Alcohol/week: 3.0 standard drinks    Types: 3 Standard drinks or equivalent per week   Drug  use: No   Sexual activity: Yes  Other Topics Concern   Not on file  Social History Narrative   Not on file   Social Determinants of Health   Financial Resource Strain: Not on file  Food Insecurity: Not on file  Transportation Needs: Not on file  Physical Activity: Not on file  Stress: Not on file  Social Connections: Not on file     Family History: The patient's family history includes Breast cancer in an other family member; Cancer in an other family member; Coronary artery disease in his paternal grandfather; Coronary artery disease (age of onset: 42) in his father; Heart attack in his father, maternal grandfather, and another family member; Hyperlipidemia in an other family member; Hypertension in an other family member.  ROS:   Please see the history of present illness.    All other systems reviewed and are negative.  EKGs/Labs/Other Studies Reviewed:    The following studies were reviewed today: CT coronary angiogram 2019: IMPRESSION: 1. Coronary calcium score of 7. This was 86 percentile for age and sex matched control.   2. Normal coronary origin with right dominance.   3. 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 Aggressive risk factor modification is recommended.  EKG:  EKG is ordered today.  The ekg ordered today demonstrates normal sinus  rhythm 85 bpm, within normal limits.  Recent Labs: 09/25/2021: Hemoglobin 16.4; Platelets 180.0; TSH 1.47 11/13/2021: ALT 22; BUN 14; Creatinine, Ser 1.16; Potassium 4.3; Sodium 137  Recent Lipid Panel    Component Value Date/Time   CHOL 98 (L) 10/14/2020 1143   TRIG 69 10/14/2020 1143   HDL 36 (L) 10/14/2020 1143   CHOLHDL 2.7 10/14/2020 1143   CHOLHDL 5.0 11/18/2015 1657   VLDL 35 (H) 11/18/2015 1657   LDLCALC 47 10/14/2020 1143     Risk Assessment/Calculations:           Physical Exam:    VS:  BP 120/82   Pulse 85   Ht 5\' 11"  (1.803 m)   Wt 260 lb 12.8 oz (118.3 kg)   SpO2 94%   BMI 36.37 kg/m     Wt Readings from Last 3 Encounters:  01/30/22 260 lb 12.8 oz (118.3 kg)  11/13/21 260 lb (117.9 kg)  10/09/21 268 lb 6.4 oz (121.7 kg)     GEN:  Well nourished, well developed in no acute distress HEENT: Normal NECK: No JVD; No carotid bruits LYMPHATICS: No lymphadenopathy CARDIAC: RRR, no murmurs, rubs, gallops RESPIRATORY:  Clear to auscultation without rales, wheezing or rhonchi  ABDOMEN: Soft, non-tender, non-distended MUSCULOSKELETAL:  No edema; No deformity  SKIN: Warm and dry NEUROLOGIC:  Alert and oriented x 3 PSYCHIATRIC:  Normal affect   ASSESSMENT:    1. Coronary artery disease involving native coronary artery of native heart without angina pectoris   2. Mixed hyperlipidemia   3. Essential hypertension   4. Syncope and collapse   5. Tachycardia, paroxysmal (HCC)    PLAN:    In order of problems listed above:  Stable, very mild nonobstructive CAD based on CT coronary angiogram in 2019.  He will continue on aspirin and statin drug. Lipids are excellent with cholesterol of 98, LDL 47, HDL 36.  He continues on rosuvastatin. Blood pressure is controlled on diltiazem, losartan, and metoprolol succinate.  Diet/weight loss discussed with the patient. Suspect a degree of volume depletion following exercise.  Recommend check 2D echo and 3-day ZIO monitor  to be complete in  his evaluation.      Medication Adjustments/Labs and Tests Ordered: Current medicines are reviewed at length with the patient today.  Concerns regarding medicines are outlined above.  Orders Placed This Encounter  Procedures   LONG TERM MONITOR (3-14 DAYS)   EKG 12-Lead   ECHOCARDIOGRAM COMPLETE   No orders of the defined types were placed in this encounter.   Patient Instructions  Medication Instructions:  Your physician recommends that you continue on your current medications as directed. Please refer to the Current Medication list given to you today.  *If you need a refill on your cardiac medications before your next appointment, please call your pharmacy*   Lab Work: NONE If you have labs (blood work) drawn today and your tests are completely normal, you will receive your results only by: MyChart Message (if you have MyChart) OR A paper copy in the mail If you have any lab test that is abnormal or we need to change your treatment, we will call you to review the results.   Testing/Procedures: Zio monitor x 3 days Your physician has recommended that you wear an event monitor. Event monitors are medical devices that record the heart's electrical activity. Doctors most often [place] these monitors to diagnose arrhythmias. Arrhythmias are problems with the speed or rhythm of the heartbeat. The monitor is a small, portable device. You can wear one while you do your normal daily activities. This is usually used to diagnose what is causing palpitations/syncope (passing out).  ECHO Your physician has requested that you have an echocardiogram. Echocardiography is a painless test that uses sound waves to create images of your heart. It provides your doctor with information about the size and shape of your heart and how well your heart's chambers and valves are working. This procedure takes approximately one hour. There are no restrictions for this procedure.  Follow-Up: At  [facility], you and your health needs are our priority.  As part of our continuing mission to provide you with exceptional heart care, we have created designated Provider Care Teams.  These Care Teams include your primary Cardiologist (physician) and Advanced Practice Providers (APPs -  Physician Assistants and Nurse Practitioners) who all work together to provide you with the care you need, when you need it.  Your next appointment:   1 year(s)  The format for your next appointment:   In Person  Provider:   Tonny Bollman, MD     Other Instructions ZIO XT- Long Term Monitor Instructions  Your physician has requested you wear a ZIO patch monitor for 14 days.  This is a single patch monitor. [employer] supplies one patch monitor per enrollment. Additional stickers are not available. Please do not apply patch if you will be having a Nuclear Stress Test,  Echocardiogram, Cardiac CT, MRI, or Chest Xray during the period you would be wearing the  monitor. The patch cannot be worn during these tests. You cannot remove and re-apply the  ZIO XT patch monitor.  Your ZIO patch monitor will be mailed 3 day USPS to your address on file. It may take 3-5 days  to receive your monitor after you have been enrolled.  Once you have received your monitor, please review the enclosed instructions. Your monitor  has already been registered assigning a specific monitor serial # to you.  Billing and Patient Assistance Program Information  We have supplied [employer] with any of your insurance information on file for billing purposes. [employer] offers a sliding scale Patient Assistance  Program for patients that do not have  insurance, or whose insurance does not completely cover the cost of the ZIO monitor.  You must apply for the Patient Assistance Program to qualify for this discounted rate.  To apply, please call [employer] at [phone], select option 4, select option 2, ask to apply for  Patient Assistance  Program. Meredeth Ide will ask your household income, and how many people  are in your household. They will quote your out-of-pocket cost based on that information.  [employer] will also be able to set up a 45-month, interest-free payment plan if needed.  Applying the monitor   Shave hair from upper left chest.  Hold abrader disc by orange tab. Rub abrader in 40 strokes over the upper left chest as  indicated in your monitor instructions.  Clean area with 4 enclosed alcohol pads. Let dry.  Apply patch as indicated in monitor instructions. Patch will be placed under collarbone on left  side of chest with arrow pointing upward.  Rub patch adhesive wings for 2 minutes. Remove white label marked "1". Remove the white  label marked "2". Rub patch adhesive wings for 2 additional minutes.  While looking in a mirror, press and release button in center of patch. A small green light will  flash 3-4 times. This will be your only indicator that the monitor has been turned on.  Do not shower for the first 24 hours. You may shower after the first 24 hours.  Press the button if you feel a symptom. You will hear a small click. Record Date, Time and  Symptom in the Patient Logbook.  When you are ready to remove the patch, follow instructions on the last 2 pages of Patient  Logbook. Stick patch monitor onto the last page of Patient Logbook.  Place Patient Logbook in the blue and white box. Use locking tab on box and tape box closed  securely. The blue and white box has prepaid postage on it. Please place it in the mailbox as  soon as possible. Your physician should have your test results approximately 7 days after the  monitor has been mailed back to [facility].  Call [facility] Customer Care at [phone] if you have questions regarding  your ZIO XT patch monitor. Call them immediately if you see an orange light blinking on your  monitor.  If your monitor falls off in less than 4 days, contact our  Monitor department at [phone].  If your monitor becomes loose or falls off after 4 days call [employer] at [phone] for  suggestions on securing your monitor       Signed, Randall Bollman, MD  01/30/2022 5:30 PM    [facility]

## 2022-01-30 NOTE — Patient Instructions (Signed)
Medication Instructions:  ?Your physician recommends that you continue on your current medications as directed. Please refer to the Current Medication list given to you today. ? ?*If you need a refill on your cardiac medications before your next appointment, please call your pharmacy* ? ? ?Lab Work: ?NONE ?If you have labs (blood work) drawn today and your tests are completely normal, you will receive your results only by: ?MyChart Message (if you have MyChart) OR ?A paper copy in the mail ?If you have any lab test that is abnormal or we need to change your treatment, we will call you to review the results. ? ? ?Testing/Procedures: ?Zio monitor x 3 days ?Your physician has recommended that you wear an event monitor. Event monitors are medical devices that record the heart?s electrical activity. Doctors most often [place] these monitors to diagnose arrhythmias. Arrhythmias are problems with the speed or rhythm of the heartbeat. The monitor is a small, portable device. You can wear one while you do your normal daily activities. This is usually used to diagnose what is causing palpitations/syncope (passing out). ? ?ECHO ?Your physician has requested that you have an echocardiogram. Echocardiography is a painless test that uses sound waves to create images of your heart. It provides your doctor with information about the size and shape of your heart and how well your heart?s chambers and valves are working. This procedure takes approximately one hour. There are no restrictions for this procedure. ? ?Follow-Up: ?At [facility], you and your health needs are our priority.  As part of our continuing mission to provide you with exceptional heart care, we have created designated Provider Care Teams.  These Care Teams include your primary Cardiologist (physician) and Advanced Practice Providers (APPs -  Physician Assistants and Nurse Practitioners) who all work together to provide you with the care you need, when you need  it. ? ?Your next appointment:   ?1 year(s) ? ?The format for your next appointment:   ?In Person ? ?Provider:   ?Sherren Mocha, MD   ? ? ?Other Instructions ?ZIO XT- Long Term Monitor Instructions ? ?Your physician has requested you wear a ZIO patch monitor for 14 days.  ?This is a single patch monitor. [employer] supplies one patch monitor per enrollment. Additional ?stickers are not available. Please do not apply patch if you will be having a Nuclear Stress Test,  ?Echocardiogram, Cardiac CT, MRI, or Chest Xray during the period you would be wearing the  ?monitor. The patch cannot be worn during these tests. You cannot remove and re-apply the  ?ZIO XT patch monitor.  ?Your ZIO patch monitor will be mailed 3 day USPS to your address on file. It may take 3-5 days  ?to receive your monitor after you have been enrolled.  ?Once you have received your monitor, please review the enclosed instructions. Your monitor  ?has already been registered assigning a specific monitor serial # to you. ? ?Billing and Patient Assistance Program Information ? ?We have supplied [employer] with any of your insurance information on file for billing purposes. ?[employer] offers a sliding scale Patient Assistance Program for patients that do not have  ?insurance, or whose insurance does not completely cover the cost of the ZIO monitor.  ?You must apply for the Patient Assistance Program to qualify for this discounted rate.  ?To apply, please call [employer] at [phone], select option 4, select option 2, ask to apply for  ?Patient Assistance Program. Theodore Demark will ask your household income, and how many people  ?  are in your household. They will quote your out-of-pocket cost based on that information.  ?[employer] will also be able to set up a 11-month interest-free payment plan if needed. ? ?Applying the monitor ?  ?Shave hair from upper left chest.  ?Hold abrader disc by orange tab. Rub abrader in 40 strokes over the upper left chest as  ?indicated in  your monitor instructions.  ?Clean area with 4 enclosed alcohol pads. Let dry.  ?Apply patch as indicated in monitor instructions. Patch will be placed under collarbone on left  ?side of chest with arrow pointing upward.  ?Rub patch adhesive wings for 2 minutes. Remove white label marked "1". Remove the white  ?label marked "2". Rub patch adhesive wings for 2 additional minutes.  ?While looking in a mirror, press and release button in center of patch. A small green light will  ?flash 3-4 times. This will be your only indicator that the monitor has been turned on.  ?Do not shower for the first 24 hours. You may shower after the first 24 hours.  ?Press the button if you feel a symptom. You will hear a small click. Record Date, Time and  ?Symptom in the Patient Logbook.  ?When you are ready to remove the patch, follow instructions on the last 2 pages of Patient  ?Logbook. Stick patch monitor onto the last page of Patient Logbook.  ?Place Patient Logbook in the blue and white box. Use locking tab on box and tape box closed  ?securely. The blue and white box has prepaid postage on it. Please place it in the mailbox as  ?soon as possible. Your physician should have your test results approximately 7 days after the  [facility]  ?[facility] [phone] you have questions regarding  ?your ZIO XT patch monitor. Call them immediately if you see an orange light blinking on your  ?monitor.  ?If your monitor falls off in less than 4 days, contact our Monitor department at [phone]  ?If your monitor becomes loose or falls off after 4 days call [employer] at [phone]  ?suggestions on securing your monitor ?  ?  ?

## 2022-02-01 DIAGNOSIS — J4550 Severe persistent asthma, uncomplicated: Secondary | ICD-10-CM | POA: Diagnosis not present

## 2022-02-01 DIAGNOSIS — R911 Solitary pulmonary nodule: Secondary | ICD-10-CM | POA: Diagnosis not present

## 2022-02-01 DIAGNOSIS — J3089 Other allergic rhinitis: Secondary | ICD-10-CM | POA: Diagnosis not present

## 2022-02-01 DIAGNOSIS — J019 Acute sinusitis, unspecified: Secondary | ICD-10-CM | POA: Diagnosis not present

## 2022-02-01 DIAGNOSIS — J301 Allergic rhinitis due to pollen: Secondary | ICD-10-CM | POA: Diagnosis not present

## 2022-02-02 DIAGNOSIS — I479 Paroxysmal tachycardia, unspecified: Secondary | ICD-10-CM

## 2022-02-02 DIAGNOSIS — R55 Syncope and collapse: Secondary | ICD-10-CM | POA: Diagnosis not present

## 2022-02-05 ENCOUNTER — Encounter: Payer: Self-pay | Admitting: Family Medicine

## 2022-02-05 DIAGNOSIS — J301 Allergic rhinitis due to pollen: Secondary | ICD-10-CM | POA: Diagnosis not present

## 2022-02-05 DIAGNOSIS — J3089 Other allergic rhinitis: Secondary | ICD-10-CM | POA: Diagnosis not present

## 2022-02-07 ENCOUNTER — Ambulatory Visit (HOSPITAL_COMMUNITY): Payer: BC Managed Care – PPO | Attending: Cardiovascular Disease

## 2022-02-07 DIAGNOSIS — I2510 Atherosclerotic heart disease of native coronary artery without angina pectoris: Secondary | ICD-10-CM | POA: Diagnosis not present

## 2022-02-07 DIAGNOSIS — R55 Syncope and collapse: Secondary | ICD-10-CM | POA: Diagnosis not present

## 2022-02-07 LAB — ECHOCARDIOGRAM COMPLETE
Area-P 1/2: 3.1 cm2
S' Lateral: 2.8 cm

## 2022-02-20 DIAGNOSIS — J3089 Other allergic rhinitis: Secondary | ICD-10-CM | POA: Diagnosis not present

## 2022-02-20 DIAGNOSIS — J301 Allergic rhinitis due to pollen: Secondary | ICD-10-CM | POA: Diagnosis not present

## 2022-02-24 ENCOUNTER — Other Ambulatory Visit: Payer: Self-pay | Admitting: Family Medicine

## 2022-02-24 DIAGNOSIS — F411 Generalized anxiety disorder: Secondary | ICD-10-CM

## 2022-02-26 ENCOUNTER — Other Ambulatory Visit: Payer: Self-pay | Admitting: Family Medicine

## 2022-02-26 DIAGNOSIS — R7989 Other specified abnormal findings of blood chemistry: Secondary | ICD-10-CM

## 2022-02-26 NOTE — Telephone Encounter (Signed)
Last visit January 9.  See previous notes.  We have tried multiple different SSRI trials, unable to tolerate.  Alprazolam 1 to 3/day depending on stress has been effective.  Has been able to decrease dosing.  Stressors discussed at January 9 visit.  Average of 1.5 to 2 pills/day.  Controlled substance database reviewed.  Last filled #60 on 01/09/2022.  Refill ordered. ? ?

## 2022-02-26 NOTE — Telephone Encounter (Signed)
Patient is requesting a refill of the following medications: ?Requested Prescriptions  ? ?Pending Prescriptions Disp Refills  ? ALPRAZolam (XANAX) 1 MG tablet [Pharmacy Med Name: ALPRAZOLAM '1MG'$  TABLETS] 60 tablet   ?  Sig: TAKE 1 TABLET(1 MG) BY MOUTH TWICE DAILY AS NEEDED  ? ? ?Date of patient request: 02/26/22 ?Last office visit: 11/13/21 ?Date of last refill: 01/09/22 ?Last refill amount: 60 ? ?

## 2022-03-06 DIAGNOSIS — J301 Allergic rhinitis due to pollen: Secondary | ICD-10-CM | POA: Diagnosis not present

## 2022-03-06 DIAGNOSIS — J3089 Other allergic rhinitis: Secondary | ICD-10-CM | POA: Diagnosis not present

## 2022-03-08 ENCOUNTER — Telehealth: Payer: Self-pay | Admitting: Cardiovascular Disease

## 2022-03-08 ENCOUNTER — Encounter: Payer: Self-pay | Admitting: Cardiovascular Disease

## 2022-03-08 MED ORDER — SILDENAFIL CITRATE 100 MG PO TABS: ORAL_TABLET | ORAL | 3 refills | Status: DC

## 2022-03-08 NOTE — Telephone Encounter (Signed)
Refill sent in for patient to [facility] per pt request. ?

## 2022-03-08 NOTE — Telephone Encounter (Signed)
Pt calling requesting a refill on sildenafil. Would [name] like to refill this medication? Please address ?

## 2022-03-08 NOTE — Telephone Encounter (Signed)
Follow Up: ? ? ? ? ?Patient says Valetta Fuller or Judson Roch usually handle this refill for him. ?

## 2022-03-08 NOTE — Telephone Encounter (Signed)
?*  STAT* If patient is at the pharmacy, call can be transferred to refill team. ? ? ?1. Which medications need to be refilled? (please list name of each medication and dose if known) Sidenafill ? ?2. Which pharmacy/location (including street and city if local pharmacy) is medication to be sent to?[facility] ? ?3. Do they need a 30 day or 90 day supply? 90 days and refills ? ?

## 2022-03-15 ENCOUNTER — Other Ambulatory Visit: Payer: Self-pay | Admitting: Cardiovascular Disease

## 2022-03-17 DIAGNOSIS — N4289 Other specified disorders of prostate: Secondary | ICD-10-CM | POA: Insufficient documentation

## 2022-03-21 ENCOUNTER — Encounter: Payer: Self-pay | Admitting: Family Medicine

## 2022-03-21 ENCOUNTER — Telehealth: Payer: Self-pay

## 2022-03-21 ENCOUNTER — Ambulatory Visit: Payer: BC Managed Care – PPO | Admitting: Family Medicine

## 2022-03-21 VITALS — BP 118/78 | HR 90 | Temp 98.3°F | Resp 16 | Ht 71.0 in | Wt 264.6 lb

## 2022-03-21 DIAGNOSIS — R339 Retention of urine, unspecified: Secondary | ICD-10-CM | POA: Diagnosis not present

## 2022-03-21 DIAGNOSIS — R350 Frequency of micturition: Secondary | ICD-10-CM

## 2022-03-21 DIAGNOSIS — N419 Inflammatory disease of prostate, unspecified: Secondary | ICD-10-CM | POA: Diagnosis not present

## 2022-03-21 DIAGNOSIS — Z7251 High risk heterosexual behavior: Secondary | ICD-10-CM

## 2022-03-21 LAB — POCT URINALYSIS DIPSTICK
Bilirubin, UA: NEGATIVE
Blood, UA: NEGATIVE
Glucose, UA: NEGATIVE
Ketones, UA: NEGATIVE
Leukocytes, UA: NEGATIVE
Nitrite, UA: NEGATIVE
Protein, UA: NEGATIVE
Spec Grav, UA: 1.015 (ref 1.010–1.025)
Urobilinogen, UA: 0.2 E.U./dL
pH, UA: 6.5 (ref 5.0–8.0)

## 2022-03-21 MED ORDER — TAMSULOSIN HCL 0.4 MG PO CAPS: 0.4000 mg | ORAL_CAPSULE | Freq: Every day | ORAL | 0 refills | Status: DC | PRN

## 2022-03-21 MED ORDER — CIPROFLOXACIN HCL 500 MG PO TABS: 500.0000 mg | ORAL_TABLET | Freq: Two times a day (BID) | ORAL | 0 refills | Status: AC

## 2022-03-21 MED ORDER — DOXYCYCLINE HYCLATE 100 MG PO TABS: 100.0000 mg | ORAL_TABLET | Freq: Two times a day (BID) | ORAL | 0 refills | Status: DC

## 2022-03-21 NOTE — Patient Instructions (Addendum)
I am suspicious of a prostate infection.  Please return tomorrow morning for lab work and for other urine test.  Then start ciprofloxacin antibiotic twice per day.  Flomax was prescribed again if needed.  I will refer you to urology but I would like to see these current symptoms improving into the weekend or early next week.  If any fevers, worsening abdominal pain, or other new or worsening symptoms be seen right away.  If any concerns on labs I will let you know. ? ?Prostatitis ? ?Prostatitis is swelling or inflammation of the prostate gland, also called the prostate. This gland is about 1.5 inches wide and 1 inch high, and it is involved in making semen. The prostate is located below a man's bladder, in front of the rectum. ?There are four types of prostatitis: ?Chronic prostatitis (CP), also called chronic pelvic pain syndrome (CPPS). This is the most common type of prostatitis. It is associated with increased muscle tone in the area between the hip bones (pelvic area), around the prostate. This type is also known as a pelvic floor disorder. ?Chronic bacterial prostatitis. This type usually results from an acute bacterial infection in the prostate gland that keeps coming back or has not been treated properly. The symptoms are less severe than those caused by acute bacterial prostatitis, which lasts a shorter time. ?Asymptomatic inflammatory prostatitis. This type does not have symptoms and does not need treatment. This is diagnosed when tests are done for other disorders of the urinary tract or reproductive tract. ?Acute bacterial prostatitis. This type starts quickly and results from an acute bacterial infection in the prostate gland. It is usually associated with a bladder infection, high fever, and chills. This is the least common type of prostatitis. ?What are the causes? ?Bacterial prostatitis is caused by an infection from bacteria. ?Chronic nonbacterial prostatitis may be caused by: ?Factors related to  the nervous system. This system includes thebrain, spinal cord, and nerves. ?An autoimmune response. This happens when the body's disease-fighting system attacks healthy tissue in the body by mistake. ?Psychological factors. These have to do with how the mind works. ?The causes of the other types of prostatitis are usually not known. ?What are the signs or symptoms? ?Symptoms of this condition depend on the type of prostatitis you have. ?Acute bacterial prostatitis ?Symptoms may include: ?Pain or burning during urination. ?Frequent and sudden urges to urinate. ?Trouble starting to urinate. ?Fever. ?Chills. ?Pain in your muscles or joints, lower back, or lower abdomen. ?Other types of prostatitis ?Symptoms may include: ?Sudden urges to urinate, or urinating often. ?Trouble starting to urinate. ?Weak urine stream. ?Dribbling after urination. ?Discharge coming from the penis. ?Pain in the testicles, the penis, or the tip of the penis. ?Pain in the area in front of the rectum and below the scrotum (perineum). ?Pain when ejaculating. ?How is this diagnosed? ?This condition may be diagnosed based on: ?A physical and medical exam. ?A digital rectal exam. For this, the health care provider may use a finger to feel the prostate. ?A urine test to check for bacteria. ?A semen sample or blood tests. ?Ultrasound. ?Urodynamic tests to check how your body handles urine. ?Cystoscopy to look inside your bladder or inside the part of your body that drains urine from the bladder (urethra). ?How is this treated? ?Treatment for this condition depends on the type of prostatitis. Treatment may involve: ?Medicines to relieve pain or inflammation, or to help relax your muscles. ?Physical therapy. ?Heat therapy. ?Biofeedback. These techniques help you  control certain body functions. ?Relaxation exercises. ?Antibiotic medicine, if your condition is caused by bacteria. ?Sitz baths. These warm water baths help to relax your pelvic floor  muscles, which helps to relieve pressure on the prostate. ?Follow these instructions at home: ?Medicines ?Take over-the-counter and prescription medicines only as told by your health care provider. ?If you were prescribed an antibiotic medicine, take it as told by your health care provider. Do not stop using the antibiotic even if you start to feel better. ?Managing pain and swelling ? ?Take sitz baths as directed by your health care provider. For a sitz bath, sit in warm water that is deep enough to cover your hips and buttocks. ?If directed, apply heat to the affected area as often as told by your health care provider. Use the heat source that your health care provider recommends, such as a moist heat pack or a heating pad. ?Place a towel between your skin and the heat source. ?Leave the heat on for 20-30 minutes. ?Remove the heat if your skin turns bright red. This is especially important if you are unable to feel pain, heat, or cold. You may have a greater risk of getting burned. ?General instructions ?Do exercises as told by your health care provider, if you were prescribed physical therapy, biofeedback, or relaxation exercises. ?Keep all follow-up visits as told by your health care provider. This is important. ?Where to find more information ?[employer] of Diabetes and Digestive and Kidney Diseases: http://www.bass.com/ ?Contact a health care provider if: ?Your symptoms get worse. ?You have a fever. ?Get help right away if: ?You have chills. ?You feel light-headed or feel like you may faint. ?You cannot urinate. ?You have blood or blood clots in your urine. ?Summary ?Prostatitis is swelling or inflammation of the prostate gland. ?Treatment for this condition depends on the type of prostatitis. ?Take over-the-counter and prescription medicines only as told by your health care provider. ?Get help right away of you have chills, feel light-headed, feel like you may faint, cannot urinate, or have blood  or blood clots in your urine. ?This information is not intended to replace advice given to you by your health care provider. Make sure you discuss any questions you have with your health care provider. ?Document Revised: 11/27/2019 Document Reviewed: 11/27/2019 ?Elsevier Patient Education ? [place]. ? ? ? ? ?

## 2022-03-21 NOTE — Progress Notes (Signed)
? ?Subjective:  ?Patient ID: Randall Nickson., male    DOB: 11/19/1970  Age: 51 y.o. MRN: [id number] ? ?CC:  ?Chief Complaint  ?Patient presents with  ? urine frequncy  ?  Pt states he is having trouble with frequent urination  ?He states then sometimes it does not want to come out  ?Started 3 to 4 days ago   ? ? ?HPI ?Randall Reed. presents for  ? ?Urinary frequency ?On and off difficulty with urinary frequency and difficulty with emptying. Some urinary dribbling. Past 6 months. Has not seen urology.  ?Flomax used for week or two in the past helpful.  ?Receptive anal intercourse few weeks ago. No worsening.  ?Worse past 4-5 days. Lower groin pain this morning.  ?Trouble urinating until after bowel movement. Pressure in bladder with BM, better after emptying bladder,  and easier to have BM after bladder empty.  ?No fever.  ?No blood in urine.  ?No otc treatments.  ?Spin class few nights ago uncomfortable in perineal area.  ?Unprotected intercourse with 2 new partners since last testing. No penile d/c or testicle pain.  ?Lab Results  ?Component Value Date  ? PSA1 0.5 10/14/2020  ? PSA 0.72 07/27/2021  ? PSA 0.79 05/25/2021  ? ? ? ? ?History ?Patient Active Problem List  ? Diagnosis Date Noted  ? Coronary artery disease involving native coronary artery of native heart without angina pectoris 03/05/2019  ? Irritable bowel syndrome 03/14/2016  ? Asthma 11/19/2015  ? 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 rotator cuff tear 10/17/2013  ? Hypogonadism male 09/04/2013  ? ED (erectile dysfunction) 07/30/2013  ? Palpitations 02/21/2012  ? BMI 33.0-33.9,adult 02/04/2012  ? Allergic rhinitis 02/04/2012  ? GERD (gastroesophageal reflux disease) 02/04/2012  ? Migraine 02/04/2012  ? GAD (generalized anxiety disorder) 02/04/2012  ? Insomnia 02/04/2012  ? Essential hypertension 04/13/2009  ? CHEST PAIN-UNSPECIFIED 04/13/2009  ? ?Past Medical History:  ?Diagnosis Date  ? Anxiety   ? Arthritis   ? Asthma   ?  Depression   ? GERD (gastroesophageal reflux disease)   ? Hyperlipidemia   ? [employer] 12/05/2020  ? Seasonal allergic reaction   ? ?Past Surgical History:  ?Procedure Laterality Date  ? LIPOMA EXCISION N/A 10/30/2018  ? Procedure: EXCISION OF MULTIPLE SUBCUTANEOUS LIPOMAS ON TORSO;  Surgeon: Donnie Mesa, MD;  Location: [place];  Service: General;  Laterality: N/A;  ? none    ? none    ? ?Allergies  ?Allergen Reactions  ? Desloratadine Other (See Comments)  ?  CLARINEX-"severe headache"  ? Loratadine Other (See Comments)  ?  "severe headache"  ? Other   ? Levbid [Hyoscyamine Sulfate] Rash  ? Telithromycin Rash  ? ?Prior to Admission medications   ?Medication Sig Start Date End Date Taking? Authorizing Provider  ?acetic acid-hydrocortisone (VOSOL-HC) otic solution Place 3 drops into both ears 3 (three) times daily. Use as needed to prevent swimmer's ear 01/10/15  Yes Leandrew Koyanagi, MD  ?albuterol (VENTOLIN HFA) 108 (90 Base) MCG/ACT inhaler INHALE 2 PUFFS INTO THE LUNGS EVERY 6 HOURS AS NEEDED FOR WHEEZING OR SHORTNESS OF BREATH 07/19/20  Yes [name], MD  ?[facility] ALLERGY 180 MG tablet Take 180 mg by mouth daily. 07/12/20  Yes [provider]  ?ALPRAZolam [name]) 1 MG tablet TAKE 1 TABLET(1 MG) BY MOUTH TWICE DAILY AS NEEDED 02/26/22  Yes [name], MD  ?aspirin EC 81 MG tablet Take 1 tablet (81 mg  total) by mouth daily. 03/07/15  Yes Sherren Mocha, MD  ?benzonatate (TESSALON) 100 MG capsule Take 100-200 mg by mouth every 8 (eight) hours as needed. 01/27/22  Yes [provider]  ?Budeson-Glycopyrrol-Formoterol (BREZTRI AEROSPHERE) 160-9-4.8 MCG/ACT AERO  09/14/21  Yes [provider]  ?Diclofenac Sodium (PENNSAID) 2 % SOLN Apply topically as needed.  04/08/20  Yes [provider]  ?dicyclomine (BENTYL) 20 MG tablet Take 20 mg by mouth 2 (two) times a day.   Yes [provider]  ?diltiazem (CARDIZEM CD) 180 MG 24 hr capsule TAKE 1  CAPSULE(180 MG) BY MOUTH DAILY 03/15/22  Yes Sherren Mocha, MD  ?doxycycline (VIBRAMYCIN) 100 MG capsule Take 100 mg by mouth 2 (two) times daily. 09/05/21  Yes [provider]  ?[place] 300 MG/2ML prefilled syringe Inject into the skin. 10/06/21  Yes [provider]  ?emtricitabine-tenofovir (TRUVADA) 200-300 MG tablet Take 1 tablet by mouth daily. 05/25/21  Yes [name], MD  ?emtricitabine-tenofovir (TRUVADA) 200-300 MG tablet Take 1 tablet by mouth daily. 11/13/21  Yes [name], MD  ?famotidine (PEPCID) 40 MG tablet Take 40 mg by mouth at bedtime.  02/18/20  Yes [provider]  ?fluticasone (FLONASE) 50 MCG/ACT nasal spray Place 2 sprays into both nostrils daily. 07/12/20  Yes [provider]  ?hydrocortisone (ANUSOL-HC) 2.5 % rectal cream APPLY RECTALLY TWICE DAILY 06/30/20  Yes [name], MD  ?ipratropium (ATROVENT) 0.06 % nasal spray Place 2 sprays into the nose 2 (two) times daily. 05/25/21  Yes [name], MD  ?ipratropium-albuterol (DUONEB) 0.5-2.5 (3) MG/3ML SOLN USE 1 VIAL VIA NEBULIZER EVERY 4 TO 6 HOURS AS NEEDED FOR COUGH OR WHEEZING OR SHORTNESS OF BREATH OR CHEST TIGHTNESS 12/25/21  Yes [provider]  ?losartan (COZAAR) 25 MG tablet TAKE 1 TABLET(25 MG) BY MOUTH DAILY 11/03/21  Yes [name], MD  ?metoprolol succinate (TOPROL-XL) 25 MG 24 hr tablet TAKE 1 TABLET(25 MG) BY MOUTH AT BEDTIME 05/24/21  Yes Sherren Mocha, MD  ?montelukast (SINGULAIR) 10 MG tablet TAKE 1 TABLET(10 MG) BY MOUTH AT BEDTIME 05/25/21  Yes [name], MD  ?mupirocin ointment (BACTROBAN) 2 % Apply 1 application topically 2 (two) times daily. 11/13/21  Yes [name], MD  ?Olopatadine-Mometasone Rennie [phone] MCG/ACT SUSP Place into the nose.   Yes [provider]  ?ondansetron (ZOFRAN-ODT) 4 MG disintegrating tablet Take 4 mg by mouth every 8 (eight) hours as needed for nausea or vomiting.   Yes [provider]   ?pantoprazole (PROTONIX) 40 MG tablet Take 40 mg by mouth 2 (two) times daily.  04/08/20  Yes [provider]  ?Potassium Chloride ER 20 MEQ TBCR TAKE 1 TABLET BY MOUTH DAILY 08/24/21  Yes Sherren Mocha, MD  ?pramoxine-hydrocortisone (PROCTOCREAM-HC) 1-1 % rectal cream APPLY TO AFFECTED AREA THREE TIMES DAILY   Yes [provider]  ?rizatriptan (MAXALT) 10 MG tablet TAKE 1 TABLET BY MOUTH AS NEEDED FOR MIGRAINE. MAY REPEAT IN 2 HOURS IF NEEDED 11/03/21  Yes [name], MD  ?rosuvastatin (CRESTOR) 10 MG tablet TAKE 1 TABLET(10 MG) BY MOUTH DAILY 03/15/22  Yes Sherren Mocha, MD  ?sildenafil (VIAGRA) 100 MG tablet TAKE 1/2 TO 1 TABLET BY MOUTH ONCE DAILY AS NEEDED PRIOR TO SEXUAL ACTIVITY 03/08/22  Yes Sherren Mocha, MD  ?Spacer/Aero-Holding Chambers DEVI Use with inhaler 10/11/21  Yes Icard, Octavio Graves, DO  ?SUMAtriptan (IMITREX) 100 MG tablet Take 100 mg by mouth (1 tablet) at for signs of  migraine headache. May take 1 more dose 2 hours later. Do not exceed 200 mg in 24 hours. 12/20/20  Yes [provider]  ?testosterone cypionate (DEPOTESTOTERONE CYPIONATE) 100 MG/ML injection Inject 0.75 mg into the muscle every 7 (seven) days. For IM use only   Yes [provider]  ?Vitamin D, Ergocalciferol, (DRISDOL) 1.25 MG (50000 UNIT) CAPS capsule Take 1 capsule (50,000 Units total) by mouth every 7 (seven) days. 10/06/21  Yes [name], MD  ?zolpidem (AMBIEN) 10 MG tablet TAKE 1 TABLET(10 MG) BY MOUTH AT BEDTIME 01/09/22  Yes [name], MD  ?clarithromycin Binnie Rail) 500 MG tablet  02/01/22   [provider]  ?clarithromycin (BIAXIN) 500 MG tablet Take 500 mg by mouth 2 (two) times daily. ?Patient not taking: Reported on 03/21/2022 02/01/22   [provider]  ?cyclobenzaprine (FLEXERIL) 10 MG tablet Take 10 mg by mouth 3 (three) times daily as needed for muscle spasms.  ?Patient not taking: Reported on 03/21/2022 04/08/20   [provider]   ?Fexofenadine HCl (ALLEGRA PO) Take 1 tablet by mouth daily.     [provider]  ?predniSONE (DELTASONE) 20 MG tablet Take 2 tablets (40 mg total) by mouth daily with breakfast. ?Patient taking differently: Take 20

## 2022-03-21 NOTE — Telephone Encounter (Signed)
Dr Carlota Raspberry future ordered a urine culture and urine cytology at 5:14 pm. Lab is already gone. I performed the culture, labeled the tube with patient's name and DOB, verified that the urine cup also had his name and DOB on it and placed both in the refrigerator. Left a note for the lab personnel, Jackelyn Poling, that she will see in the morning on 03/22/2022 ?

## 2022-03-22 ENCOUNTER — Other Ambulatory Visit (INDEPENDENT_AMBULATORY_CARE_PROVIDER_SITE_OTHER): Payer: BC Managed Care – PPO

## 2022-03-22 ENCOUNTER — Other Ambulatory Visit (HOSPITAL_COMMUNITY)
Admission: RE | Admit: 2022-03-22 | Discharge: 2022-03-22 | Disposition: A | Discharge status: Home / Self Care | Payer: BC Managed Care – PPO | Source: Ambulatory Visit | Attending: Family Medicine | Admitting: Family Medicine

## 2022-03-22 DIAGNOSIS — Z7251 High risk heterosexual behavior: Secondary | ICD-10-CM

## 2022-03-22 DIAGNOSIS — R339 Retention of urine, unspecified: Secondary | ICD-10-CM | POA: Insufficient documentation

## 2022-03-22 DIAGNOSIS — N419 Inflammatory disease of prostate, unspecified: Secondary | ICD-10-CM | POA: Insufficient documentation

## 2022-03-22 DIAGNOSIS — R350 Frequency of micturition: Secondary | ICD-10-CM | POA: Diagnosis not present

## 2022-03-22 LAB — PSA: PSA: 0.65 ng/mL (ref 0.10–4.00)

## 2022-03-23 LAB — RPR: RPR Ser Ql: NONREACTIVE

## 2022-03-23 LAB — URINE CULTURE
MICRO NUMBER:: 13414888
SPECIMEN QUALITY:: ADEQUATE

## 2022-03-23 LAB — URINE CYTOLOGY ANCILLARY ONLY
Chlamydia: NEGATIVE
Comment: NEGATIVE
Comment: NEGATIVE
Comment: NORMAL
Neisseria Gonorrhea: NEGATIVE
Trichomonas: NEGATIVE

## 2022-03-23 LAB — HIV ANTIBODY (ROUTINE TESTING W REFLEX): HIV 1&2 Ab, 4th Generation: NONREACTIVE

## 2022-03-27 DIAGNOSIS — J3089 Other allergic rhinitis: Secondary | ICD-10-CM | POA: Diagnosis not present

## 2022-03-27 DIAGNOSIS — J301 Allergic rhinitis due to pollen: Secondary | ICD-10-CM | POA: Diagnosis not present

## 2022-04-03 ENCOUNTER — Encounter: Payer: Self-pay | Admitting: Family Medicine

## 2022-04-03 DIAGNOSIS — R339 Retention of urine, unspecified: Secondary | ICD-10-CM

## 2022-04-03 DIAGNOSIS — N419 Inflammatory disease of prostate, unspecified: Secondary | ICD-10-CM

## 2022-04-03 DIAGNOSIS — R350 Frequency of micturition: Secondary | ICD-10-CM

## 2022-04-04 MED ORDER — TAMSULOSIN HCL 0.4 MG PO CAPS: 0.4000 mg | ORAL_CAPSULE | Freq: Every day | ORAL | 0 refills | Status: DC | PRN

## 2022-04-04 NOTE — Telephone Encounter (Signed)
See my chart message

## 2022-04-04 NOTE — Telephone Encounter (Signed)
Pt is asking if he can continue Flomax as he states better control.

## 2022-04-05 ENCOUNTER — Other Ambulatory Visit: Payer: Self-pay | Admitting: Family Medicine

## 2022-04-05 DIAGNOSIS — R339 Retention of urine, unspecified: Secondary | ICD-10-CM

## 2022-04-05 DIAGNOSIS — N419 Inflammatory disease of prostate, unspecified: Secondary | ICD-10-CM

## 2022-04-05 DIAGNOSIS — R350 Frequency of micturition: Secondary | ICD-10-CM

## 2022-04-05 MED ORDER — TAMSULOSIN HCL 0.4 MG PO CAPS: 0.4000 mg | ORAL_CAPSULE | Freq: Every day | ORAL | 0 refills | Status: DC | PRN

## 2022-04-05 NOTE — Telephone Encounter (Signed)
Patient is requesting a refill of the following medications: Requested Prescriptions   Pending Prescriptions Disp Refills   tamsulosin (FLOMAX) 0.4 MG CAPS capsule [Pharmacy Med Name: TAMSULOSIN 0.'4MG'$  CAPSULES] 90 capsule     Sig: TAKE 1 CAPSULE(0.4 MG) BY MOUTH DAILY AS NEEDED    Date of patient request: 04/05/22 Last office visit: 03/21/22 Date of last refill: 04/04/22 Last refill amount: 90

## 2022-04-06 ENCOUNTER — Telehealth: Payer: Self-pay

## 2022-04-06 DIAGNOSIS — R339 Retention of urine, unspecified: Secondary | ICD-10-CM

## 2022-04-06 DIAGNOSIS — N419 Inflammatory disease of prostate, unspecified: Secondary | ICD-10-CM

## 2022-04-06 DIAGNOSIS — R350 Frequency of micturition: Secondary | ICD-10-CM

## 2022-04-06 MED ORDER — TAMSULOSIN HCL 0.4 MG PO CAPS: 0.4000 mg | ORAL_CAPSULE | Freq: Every day | ORAL | 0 refills | Status: DC | PRN

## 2022-04-06 NOTE — Telephone Encounter (Signed)
No additional notes required pt requested refill

## 2022-04-11 ENCOUNTER — Encounter: Payer: Self-pay | Admitting: Family Medicine

## 2022-04-11 ENCOUNTER — Other Ambulatory Visit: Payer: Self-pay

## 2022-04-11 ENCOUNTER — Ambulatory Visit (INDEPENDENT_AMBULATORY_CARE_PROVIDER_SITE_OTHER): Payer: BC Managed Care – PPO | Admitting: Family Medicine

## 2022-04-11 VITALS — BP 126/80 | HR 100 | Temp 98.4°F | Resp 16 | Ht 71.0 in | Wt 258.0 lb

## 2022-04-11 DIAGNOSIS — R1032 Left lower quadrant pain: Secondary | ICD-10-CM | POA: Diagnosis not present

## 2022-04-11 MED ORDER — CIPROFLOXACIN HCL 500 MG PO TABS: 500.0000 mg | ORAL_TABLET | Freq: Two times a day (BID) | ORAL | 0 refills | Status: DC

## 2022-04-11 MED ORDER — CIPROFLOXACIN HCL 500 MG PO TABS
500.0000 mg | ORAL_TABLET | Freq: Two times a day (BID) | ORAL | 0 refills | Status: AC
Start: 2022-04-11 — End: 2022-04-21

## 2022-04-11 MED ORDER — METRONIDAZOLE 500 MG PO TABS: 500.0000 mg | ORAL_TABLET | Freq: Two times a day (BID) | ORAL | 0 refills | Status: AC

## 2022-04-11 NOTE — Progress Notes (Signed)
   Subjective:    Patient ID: Randall Apple., male    DOB: February 23, 1971, 51 y.o.   MRN: [id number]  HPI Abd pain- pt reports sxs started on Sunday w/ vomiting after breakfast.  Monday 'just didn't feel good'.  Tuesday at 3am developed 'similar pain to when I have diverticulitis'.  Around 4-5am yesterday started having diarrhea.  Has only had crackers and ginger ale x24 hrs.  Pain is L lower abd.  + decreased appetite.  + mild nausea.  Abd is TTP.     Review of Systems For ROS see HPI     Objective:   Physical Exam Vitals reviewed.  Constitutional:      General: He is not in acute distress.    Appearance: He is well-developed. He is obese. He is not ill-appearing.  HENT:     Head: Normocephalic and atraumatic.  Abdominal:     General: There is no distension.     Palpations: Abdomen is soft. There is no hepatomegaly or mass.     Tenderness: There is abdominal tenderness in the left lower quadrant. There is no guarding or rebound. Negative signs include Murphy's sign and McBurney's sign.  Skin:    General: Skin is warm and dry.  Neurological:     General: No focal deficit present.     Mental Status: He is alert and oriented to person, place, and time.          Assessment & Plan:  LLQ pain- new to provider, recurrent problem for pt.  He has known diverticular disease per last colonoscopy and previous issues w/ diverticulitis.  He reports this feels similar.  Will start Cipro and Flagyl.  Clear liquid diet until sxs improve.  Encouraged him to call GI and let them know of this episode so they can document in his chart.  Reviewed supportive care and red flags that should prompt return.  Pt expressed understanding and is in agreement w/ plan.

## 2022-04-11 NOTE — Patient Instructions (Signed)
Follow up as needed or as scheduled START the Cipro and Flagyl (Metronidazole) twice daily- w/ a little bit of food Drink LOTS of fluids Definitely touch base w/ GI and let them know you're having an episode Call with any questions or concerns- particularly if things aren't improving or worsening Hang in there!!

## 2022-04-15 ENCOUNTER — Other Ambulatory Visit: Payer: Self-pay | Admitting: Family Medicine

## 2022-04-15 DIAGNOSIS — F5104 Psychophysiologic insomnia: Secondary | ICD-10-CM

## 2022-04-16 DIAGNOSIS — J301 Allergic rhinitis due to pollen: Secondary | ICD-10-CM | POA: Diagnosis not present

## 2022-04-16 DIAGNOSIS — J3089 Other allergic rhinitis: Secondary | ICD-10-CM | POA: Diagnosis not present

## 2022-04-16 NOTE — Telephone Encounter (Signed)
Controlled substance database reviewed.  Alprazolam last filled 03/27/2022, 02/26/2022, zolpidem No. 90 on 01/10/2022.  Zolpidem refill ordered.

## 2022-04-17 DIAGNOSIS — J301 Allergic rhinitis due to pollen: Secondary | ICD-10-CM | POA: Diagnosis not present

## 2022-04-17 DIAGNOSIS — J3089 Other allergic rhinitis: Secondary | ICD-10-CM | POA: Diagnosis not present

## 2022-04-23 ENCOUNTER — Encounter (HOSPITAL_BASED_OUTPATIENT_CLINIC_OR_DEPARTMENT_OTHER): Payer: Self-pay

## 2022-04-23 ENCOUNTER — Encounter: Payer: Self-pay | Admitting: Cardiovascular Disease

## 2022-04-23 ENCOUNTER — Emergency Department (HOSPITAL_COMMUNITY): Payer: BC Managed Care – PPO

## 2022-04-23 ENCOUNTER — Other Ambulatory Visit: Payer: Self-pay

## 2022-04-23 ENCOUNTER — Emergency Department (HOSPITAL_BASED_OUTPATIENT_CLINIC_OR_DEPARTMENT_OTHER)
Admission: EM | Admit: 2022-04-23 | Discharge: 2022-04-24 | Discharge status: Against Medical Advice | Payer: BC Managed Care – PPO | Attending: Emergency Medicine | Admitting: Emergency Medicine

## 2022-04-23 DIAGNOSIS — Z7982 Long term (current) use of aspirin: Secondary | ICD-10-CM | POA: Insufficient documentation

## 2022-04-23 DIAGNOSIS — I2510 Atherosclerotic heart disease of native coronary artery without angina pectoris: Secondary | ICD-10-CM | POA: Insufficient documentation

## 2022-04-23 DIAGNOSIS — K5730 Diverticulosis of large intestine without perforation or abscess without bleeding: Secondary | ICD-10-CM | POA: Diagnosis not present

## 2022-04-23 DIAGNOSIS — I700 Atherosclerosis of aorta: Secondary | ICD-10-CM | POA: Diagnosis not present

## 2022-04-23 DIAGNOSIS — R1032 Left lower quadrant pain: Secondary | ICD-10-CM | POA: Diagnosis not present

## 2022-04-23 DIAGNOSIS — R300 Dysuria: Secondary | ICD-10-CM | POA: Insufficient documentation

## 2022-04-23 DIAGNOSIS — R102 Pelvic and perineal pain: Secondary | ICD-10-CM

## 2022-04-23 DIAGNOSIS — D72829 Elevated white blood cell count, unspecified: Secondary | ICD-10-CM | POA: Diagnosis not present

## 2022-04-23 DIAGNOSIS — K5792 Diverticulitis of intestine, part unspecified, without perforation or abscess without bleeding: Secondary | ICD-10-CM | POA: Insufficient documentation

## 2022-04-23 LAB — CBC
HCT: 50.8 % (ref 39.0–52.0)
Hemoglobin: 17.1 g/dL — ABNORMAL HIGH (ref 13.0–17.0)
MCH: 29.3 pg (ref 26.0–34.0)
MCHC: 33.7 g/dL (ref 30.0–36.0)
MCV: 87.1 fL (ref 80.0–100.0)
Platelets: 184 10*3/uL (ref 150–400)
RBC: 5.83 MIL/uL — ABNORMAL HIGH (ref 4.22–5.81)
RDW: 13.9 % (ref 11.5–15.5)
WBC: 10.8 10*3/uL — ABNORMAL HIGH (ref 4.0–10.5)
nRBC: 0 % (ref 0.0–0.2)

## 2022-04-23 LAB — COMPREHENSIVE METABOLIC PANEL
ALT: 25 U/L (ref 0–44)
AST: 17 U/L (ref 15–41)
Albumin: 4.8 g/dL (ref 3.5–5.0)
Alkaline Phosphatase: 62 U/L (ref 38–126)
Anion gap: 10 (ref 5–15)
BUN: 9 mg/dL (ref 6–20)
CO2: 24 mmol/L (ref 22–32)
Calcium: 9.8 mg/dL (ref 8.9–10.3)
Chloride: 105 mmol/L (ref 98–111)
Creatinine, Ser: 1.14 mg/dL (ref 0.61–1.24)
GFR, Estimated: >60 mL/min (ref >60)
Glucose, Bld: 103 mg/dL — ABNORMAL HIGH (ref 70–99)
Potassium: 4.1 mmol/L (ref 3.5–5.1)
Sodium: 139 mmol/L (ref 135–145)
Total Bilirubin: 1 mg/dL (ref 0.3–1.2)
Total Protein: 7.7 g/dL (ref 6.5–8.1)

## 2022-04-23 LAB — URINALYSIS, ROUTINE W REFLEX MICROSCOPIC
Bilirubin Urine: NEGATIVE
Glucose, UA: NEGATIVE mg/dL
Ketones, ur: NEGATIVE mg/dL
Leukocytes,Ua: NEGATIVE
Nitrite: NEGATIVE
Protein, ur: NEGATIVE mg/dL
Specific Gravity, Urine: 1.011 (ref 1.005–1.030)
pH: 5.5 (ref 5.0–8.0)

## 2022-04-23 LAB — LIPASE, BLOOD: Lipase: 24 U/L (ref 11–51)

## 2022-04-23 MED ORDER — IOHEXOL 300 MG/ML  SOLN
100.0000 mL | Freq: Once | INTRAMUSCULAR | Status: AC | PRN
  Administered 2022-04-23: 100 mL via INTRAVENOUS

## 2022-04-23 NOTE — Discharge Instructions (Signed)
Go to [facility] for CT abdomen and pelvis

## 2022-04-23 NOTE — ED Notes (Signed)
Pt sent POV to [facility] ED for CT scan per [place], [place]. IV secured for POV transport per VO of [place]. Pt stable at time of departure.

## 2022-04-23 NOTE — ED Provider Notes (Signed)
[facility] EMERGENCY DEPT Provider Note   CSN: [id number] Arrival date & time: 04/23/22  1644     History  Chief Complaint  Patient presents with   Abdominal Pain    Randall Bonn. is a 51 y.o. male with history of diverticulitis who presents to the ED for evaluation of left lower quadrant pain and pelvic pain.  Patient first developed flareup of his diverticulitis on 04/11/2022 and his PCP initiated treatment with ciprofloxacin and Flagyl.  He finished this 4 days ago and reports that he was initially feeling better by the completion of treatment although pain has now returned.  He is having occasional loose stools that are nonbloody.  As of yesterday, he also developed suprapubic pain that he described as "knifelike" that comes in spasms and radiates up his abdomen.  He also has pain when urinating that feels sharp.  No previous history of kidney stones.  He denies nausea and vomiting, fevers, chest pain, shortness of breath.   Abdominal Pain      Home Medications Prior to Admission medications   Medication Sig Start Date End Date Taking? Authorizing Provider  acetic acid-hydrocortisone (VOSOL-HC) otic solution Place 3 drops into both ears 3 (three) times daily. Use as needed to prevent swimmer's ear 01/10/15   Leandrew Koyanagi, MD  albuterol (VENTOLIN HFA) 108 (90 Base) MCG/ACT inhaler INHALE 2 PUFFS INTO THE LUNGS EVERY 6 HOURS AS NEEDED FOR WHEEZING OR SHORTNESS OF BREATH 07/19/20   [name], MD  [facility] ALLERGY 180 MG tablet Take 180 mg by mouth daily. 07/12/20   [provider]  ALPRAZolam [name]) 1 MG tablet TAKE 1 TABLET(1 MG) BY MOUTH TWICE DAILY AS NEEDED 02/26/22   [name], MD  aspirin EC 81 MG tablet Take 1 tablet (81 mg total) by mouth daily. 03/07/15   Sherren Mocha, MD  benzonatate (TESSALON) 100 MG capsule Take 100-200 mg by mouth every 8 (eight) hours as needed. 01/27/22   [provider]   Budeson-Glycopyrrol-Formoterol (BREZTRI AEROSPHERE) 160-9-4.8 MCG/ACT AERO  09/14/21   [provider]  Diclofenac Sodium (PENNSAID) 2 % SOLN Apply topically as needed.  04/08/20   [provider]  dicyclomine (BENTYL) 20 MG tablet Take 20 mg by mouth 2 (two) times a day.    [provider]  diltiazem (CARDIZEM CD) 180 MG 24 hr capsule TAKE 1 CAPSULE(180 MG) BY MOUTH DAILY 03/15/22   Sherren Mocha, MD  DUPIXENT 300 MG/2ML prefilled syringe Inject into the skin. 10/06/21   [provider]  emtricitabine-tenofovir (TRUVADA) 200-300 MG tablet Take 1 tablet by mouth daily. 05/25/21   [name], MD  emtricitabine-tenofovir (TRUVADA) 200-300 MG tablet Take 1 tablet by mouth daily. 11/13/21   [name], MD  famotidine (PEPCID) 40 MG tablet Take 40 mg by mouth at bedtime.  02/18/20   [provider]  fluticasone (FLONASE) 50 MCG/ACT nasal spray Place 2 sprays into both nostrils daily. 07/12/20   [provider]  hydrocortisone (ANUSOL-HC) 2.5 % rectal cream APPLY RECTALLY TWICE DAILY 06/30/20   [name], MD  ipratropium (ATROVENT) 0.06 % nasal spray Place 2 sprays into the nose 2 (two) times daily. 05/25/21   [name], MD  ipratropium-albuterol (DUONEB) 0.5-2.5 (3) MG/3ML SOLN USE 1 VIAL VIA NEBULIZER EVERY 4 TO 6 HOURS AS NEEDED FOR COUGH OR WHEEZING OR SHORTNESS OF BREATH OR CHEST TIGHTNESS 12/25/21   [provider]  losartan (COZAAR) 25 MG tablet TAKE  1 TABLET(25 MG) BY MOUTH DAILY 11/03/21   [name], MD  metoprolol succinate (TOPROL-XL) 25 MG 24 hr tablet TAKE 1 TABLET(25 MG) BY MOUTH AT BEDTIME 05/24/21   Sherren Mocha, MD  montelukast (SINGULAIR) 10 MG tablet TAKE 1 TABLET(10 MG) BY MOUTH AT BEDTIME 05/25/21   [name], MD  mupirocin ointment (BACTROBAN) 2 % Apply 1 application topically 2 (two) times daily. 11/13/21   [name], MD  Olopatadine-Mometasone Rennie Plowman) [phone] MCG/ACT SUSP  Place into the nose.    [provider]  ondansetron (ZOFRAN-ODT) 4 MG disintegrating tablet Take 4 mg by mouth every 8 (eight) hours as needed for nausea or vomiting.    [provider]  pantoprazole (PROTONIX) 40 MG tablet Take 40 mg by mouth 2 (two) times daily.  04/08/20   [provider]  Potassium Chloride ER 20 MEQ TBCR TAKE 1 TABLET BY MOUTH DAILY 08/24/21   Sherren Mocha, MD  pramoxine-hydrocortisone (PROCTOCREAM-HC) 1-1 % rectal cream APPLY TO AFFECTED AREA THREE TIMES DAILY    [provider]  rizatriptan (MAXALT) 10 MG tablet TAKE 1 TABLET BY MOUTH AS NEEDED FOR MIGRAINE. MAY REPEAT IN 2 HOURS IF NEEDED 11/03/21   [name], MD  rosuvastatin (CRESTOR) 10 MG tablet TAKE 1 TABLET(10 MG) BY MOUTH DAILY 03/15/22   Sherren Mocha, MD  sildenafil (VIAGRA) 100 MG tablet TAKE 1/2 TO 1 TABLET BY MOUTH ONCE DAILY AS NEEDED PRIOR TO SEXUAL ACTIVITY 03/08/22   Sherren Mocha, MD  Spacer/Aero-Holding Josiah Lobo DEVI Use with inhaler 10/11/21   Icard, Octavio Graves, DO  SUMAtriptan (IMITREX) 100 MG tablet Take 100 mg by mouth (1 tablet) at for signs of migraine headache. May take 1 more dose 2 hours later. Do not exceed 200 mg in 24 hours. 12/20/20   [provider]  tamsulosin (FLOMAX) 0.4 MG CAPS capsule Take 1 capsule (0.4 mg total) by mouth daily as needed. 04/06/22   [name], MD  testosterone cypionate (DEPOTESTOTERONE CYPIONATE) 100 MG/ML injection Inject 0.75 mg into the muscle every 7 (seven) days. For IM use only    [provider]  Vitamin D, Ergocalciferol, (DRISDOL) 1.25 MG (50000 UNIT) CAPS capsule Take 1 capsule (50,000 Units total) by mouth every 7 (seven) days. 10/06/21   [name], MD  zolpidem (AMBIEN) 10 MG tablet TAKE 1 TABLET(10 MG) BY MOUTH AT BEDTIME 04/16/22   [name], MD      Allergies    Desloratadine, Loratadine, Other, and Levbid [hyoscyamine sulfate]    Review of Systems   Review of Systems   Gastrointestinal:  Positive for abdominal pain.    Physical Exam Updated Vital Signs BP (!) 141/88   Pulse 76   Temp 98 F (36.7 C) (Oral)   Resp 18   Ht '5\' 11"'$  (1.803 m)   Wt 117 kg   SpO2 94%   BMI 35.98 kg/m  Physical Exam Vitals and nursing note reviewed.  Constitutional:      General: He is not in acute distress.    Appearance: He is not ill-appearing.  HENT:     Head: Atraumatic.  Eyes:     Conjunctiva/sclera: Conjunctivae normal.  Cardiovascular:     Rate and Rhythm: Normal rate and regular rhythm.     Pulses: Normal pulses.     Heart sounds: No murmur heard. Pulmonary:     Effort: Pulmonary effort is normal. No respiratory distress.     Breath sounds: Normal breath sounds.  Abdominal:     General: Abdomen is flat. There is no distension.     Palpations: Abdomen is soft.     Tenderness: There is abdominal tenderness in the suprapubic area and left lower quadrant. There is no right CVA tenderness, left CVA tenderness or guarding. Negative signs include Murphy's sign and McBurney's sign.  Musculoskeletal:        General: Normal range of motion.     Cervical back: Normal range of motion.  Skin:    General: Skin is warm and dry.     Capillary Refill: Capillary refill takes less than 2 seconds.  Neurological:     General: No focal deficit present.     Mental Status: He is alert.  Psychiatric:        Mood and Affect: Mood normal.     ED Results / Procedures / Treatments   Labs (all labs ordered are listed, but only abnormal results are displayed) Labs Reviewed  COMPREHENSIVE METABOLIC PANEL - Abnormal; Notable for the following components:      Result Value   Glucose, Bld 103 (*)    All other components within normal limits  CBC - Abnormal; Notable for the following components:   WBC 10.8 (*)    RBC 5.83 (*)    Hemoglobin 17.1 (*)    All other components within normal limits  URINALYSIS, ROUTINE W REFLEX MICROSCOPIC - Abnormal; Notable for the  following components:   Hgb urine dipstick TRACE (*)    All other components within normal limits  LIPASE, BLOOD    EKG None  Radiology No results found.  Procedures Procedures    Medications Ordered in ED Medications - No data to display  ED Course/ Medical Decision Making/ A&P                           Medical Decision Making Amount and/or Complexity of Data Reviewed Labs: ordered.   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)  Tobacco Use   Smoking status: Never   Smokeless tobacco: Never  Vaping Use   Vaping Use: Never used  Substance and Sexual Activity   Alcohol use: Yes    Alcohol/week: 3.0 standard drinks of alcohol    Types: 3 Standard drinks or equivalent per week   Drug use: No   Sexual activity: Yes  Other Topics Concern   Not on file  Social History Narrative   Not on file   Social Determinants of Health   Financial Resource Strain: Not on file  Food Insecurity: Not on file  Transportation Needs: Not on file  Physical Activity: Not on file  Stress: Not on file  Social Connections: Not on file  Intimate Partner Violence: Not on file     Initial impression:  This patient presents to the ED for concern of left lower quadrant pain and new suprapubic pain, this involves an extensive number of treatment options, and is a complaint that carries with it a high risk of complications and morbidity.   Differentials include diverticulitis, SBO, UTI, kidney stone, pyelonephritis, pancreatitis.   Comorbidities affecting care:  CAD  Additional history obtained: Wife, communication records with cardiologist and  PCP records from 04/11/2022  Lab Tests  I Ordered, reviewed, and interpreted labs  and EKG.  The pertinent results include:  CMP unremarkable Leukocytosis 10.8 Negative lipase Trace amounts of blood on UA but no evidence of infection  ED Course/Re-evaluation: 50 year old male presents emergency department for evaluation of left lower quadrant pain and new suprapubic pain and dysuria.  Vitals are without significant abnormality.  On exam, patient is overall well-appearing and in no acute distress.  He has mild tenderness to palpation of the left lower quadrant, and also has tenderness to the suprapubic region.  Abdomen is otherwise soft, nondistended without peritoneal signs.  On labs, he has mild leukocytosis of 10.8 and his urine was clear aside from trace amounts of blood.  Remainder of labs was normal.  Unfortunately, we are without CT scanner today at [facility] and patient require CT scan of abdomen and pelvis.  I spoke with Dr. Lavenia Atlas at [facility] who agrees to accept patient for ED to ED transfer for CT abdomen and pelvis.  Patient will be transporting via POV.  Patient expresses understanding and is amenable to plan.  Disposition:  After consideration of the diagnostic results, physical exam, history and the patients response to treatment feel that the patent would benefit from transfer to [facility] for CT abdomen and pelvis.   Left lower quadrant pain Pelvic pain: Plan and management as described above. Discharged home in good condition.  Final Clinical Impression(s) / ED Diagnoses Final diagnoses:  None    Rx / DC Orders ED Discharge Orders     None         Rodena Piety 04/23/22 2100    Fredia Sorrow, MD 05/05/22 [phone]

## 2022-04-23 NOTE — ED Triage Notes (Signed)
Patient here POV from Home.  Endorses ABD Pain for approximately 2 Months that has been Intermittent and Spasm-Like.  History of Diverticulosis   Today Pain has become more Constant and Sharp. Associated with Nausea. No Emesis today. Moderate Diarrhea. No Fevers. Painful to Urinate at Times.  NAD Noted during Triage. A&Ox4. GCS 15. Ambulatory.

## 2022-04-24 NOTE — ED Notes (Signed)
Patient returned his stickers to staff and left the ED.

## 2022-04-24 NOTE — Telephone Encounter (Signed)
Pt requesting that Dr Burt Knack review his recent CT done in the ED yesterday (seen for diverticulitis flare) and has concerns about recent bradycardia episodes. Routing to MD.

## 2022-04-25 ENCOUNTER — Other Ambulatory Visit: Payer: Self-pay | Admitting: Cardiovascular Disease

## 2022-04-25 ENCOUNTER — Ambulatory Visit: Payer: BC Managed Care – PPO | Admitting: Family Medicine

## 2022-04-25 ENCOUNTER — Other Ambulatory Visit (HOSPITAL_COMMUNITY)
Admission: RE | Admit: 2022-04-25 | Discharge: 2022-04-25 | Disposition: A | Discharge status: Home / Self Care | Payer: BC Managed Care – PPO | Source: Ambulatory Visit | Attending: Family Medicine | Admitting: Family Medicine

## 2022-04-25 ENCOUNTER — Other Ambulatory Visit: Payer: Self-pay | Admitting: Family Medicine

## 2022-04-25 VITALS — BP 128/80 | HR 71 | Temp 98.1°F | Resp 16 | Ht 71.0 in | Wt 254.8 lb

## 2022-04-25 DIAGNOSIS — K5792 Diverticulitis of intestine, part unspecified, without perforation or abscess without bleeding: Secondary | ICD-10-CM | POA: Diagnosis not present

## 2022-04-25 DIAGNOSIS — Z113 Encounter for screening for infections with a predominantly sexual mode of transmission: Secondary | ICD-10-CM | POA: Insufficient documentation

## 2022-04-25 DIAGNOSIS — R319 Hematuria, unspecified: Secondary | ICD-10-CM | POA: Diagnosis not present

## 2022-04-25 DIAGNOSIS — I10 Essential (primary) hypertension: Secondary | ICD-10-CM

## 2022-04-25 DIAGNOSIS — R82998 Other abnormal findings in urine: Secondary | ICD-10-CM | POA: Diagnosis not present

## 2022-04-25 LAB — POCT URINALYSIS DIP (MANUAL ENTRY)
Bilirubin, UA: NEGATIVE
Glucose, UA: NEGATIVE mg/dL
Ketones, POC UA: NEGATIVE mg/dL
Leukocytes, UA: NEGATIVE
Nitrite, UA: NEGATIVE
Protein Ur, POC: NEGATIVE mg/dL
Spec Grav, UA: 1.01 (ref 1.010–1.025)
Urobilinogen, UA: 1 E.U./dL
pH, UA: 5 (ref 5.0–8.0)

## 2022-04-25 MED ORDER — TRAMADOL HCL 50 MG PO TABS: 50.0000 mg | ORAL_TABLET | Freq: Three times a day (TID) | ORAL | 0 refills | Status: AC | PRN

## 2022-04-25 NOTE — Patient Instructions (Addendum)
Continue Augmentin for diverticulitis as prescribed by gastroenterology.  Bland foods, fluids are most important.  No sign of infection on the urine test but with the small amount of blood noted, should be followed up with urology -  should not need another referral but let me know if one is needed. Recheck in 1 week, sooner or to urgent care/ER sooner if needed. Tramadol only if needed.   Here is the number for [facility]: [facility] Address: [address], [place], [place] [address] Phone: [phone]

## 2022-04-25 NOTE — Progress Notes (Signed)
Subjective:  Patient ID: Randall Reed., male    DOB: December 23, 1970  Age: 51 y.o. MRN: [id number]  CC:  Chief Complaint  Patient presents with   Hematuria    Pt notes dark urine on Sunday, thought he was dehydrated, went to ED [place] and had CT showing diverticulitis, is being treated but is now here to be treated for abdominal pain, as well as blood in urine     HPI Randall Reed. presents for   Hematuria As above noted darker urine 3 days ago.  Evaluated at [place] ER on June 19th.  Note reviewed, left lower quadrant pain, pelvic pain.  Had previously been treated with Cipro and Flagyl twice daily on June 7.  finished 4 days prior.  Feeling better towards the completion of that treatment, occasional loose stools.  Suprapubic pain recurred day prior.  Also with pain with urination.  Mild leukocytosis 10.8, with hemoglobin 17.1.  Normal lipaseUrine clear except for trace of blood.  Negative nitrite/LE.  CT abdomen pelvis [facility] at [facility]  Colonic diverticulosis with Alden Server uncomplicated acute diverticulitis.  Underlying mass lesion not excluded.  Recommended colonoscopy status post treatment and status post complete resolution of inflammatory changes to exclude underlying lesion. Had CT, left ER planned to follow up with PCP.  Per pt - lower abdominal pain was intermittent stabbing pain. Different from prior diverticulitis. He discussed CT results with [name] yesterday. Started on Augmentin. S/p 3 doses. Zofran increased to '8mg'$  for nausea by GI No fever. 1 episode diarrhea today. No blood in stool.  Pain better since starting abx.  Dark urine last weekend. No visible blood.  Referred to urology in May for frequent urination - has not received call.  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   rotator cuff tear 10/17/2013   Hypogonadism male 09/04/2013   ED (erectile dysfunction) 07/30/2013   Palpitations 02/21/2012   BMI 33.0-33.9,adult 02/04/2012   Allergic rhinitis 02/04/2012   GERD (gastroesophageal reflux disease) 02/04/2012   Migraine 02/04/2012   GAD (generalized anxiety disorder) 02/04/2012   Insomnia 02/04/2012   Essential hypertension 04/13/2009   CHEST PAIN-UNSPECIFIED 04/13/2009   Past Medical History:  Diagnosis Date   Anxiety    Arthritis    Asthma    Depression    GERD (gastroesophageal reflux disease)    Hyperlipidemia    Phreesia 12/05/2020   Seasonal allergic reaction    Past Surgical History:  Procedure Laterality Date   LIPOMA EXCISION N/A 10/30/2018   Procedure: EXCISION OF MULTIPLE SUBCUTANEOUS LIPOMAS ON TORSO;  Surgeon: Donnie Mesa, MD;  Location: [facility];  Service: General;  Laterality: N/A;   none     none     Allergies  Allergen Reactions   Desloratadine Other (See Comments)    CLARINEX-"severe headache"   Loratadine Other (See Comments)    "severe headache"   Other    Levbid [Hyoscyamine Sulfate] Rash   Prior to Admission medications   Medication Sig Start Date End Date Taking? Authorizing Provider  acetic acid-hydrocortisone (VOSOL-HC) otic solution Place 3 drops into both ears 3 (three) times daily. Use as needed to prevent swimmer's ear 01/10/15  Yes Leandrew Koyanagi, MD  albuterol (VENTOLIN  HFA) 108 (90 Base) MCG/ACT inhaler INHALE 2 PUFFS INTO THE LUNGS EVERY 6 HOURS AS NEEDED FOR WHEEZING OR SHORTNESS OF BREATH 07/19/20  Yes [name], MD  [facility] ALLERGY 180 MG tablet Take 180 mg by mouth daily. 07/12/20  Yes [provider]  ALPRAZolam [name]) 1 MG tablet TAKE 1 TABLET(1 MG) BY MOUTH TWICE DAILY AS NEEDED 02/26/22  Yes [name], MD  aspirin EC 81 MG tablet Take 1 tablet (81 mg  total) by mouth daily. 03/07/15  Yes Sherren Mocha, MD  benzonatate (TESSALON) 100 MG capsule Take 100-200 mg by mouth every 8 (eight) hours as needed. 01/27/22  Yes [provider]  Budeson-Glycopyrrol-Formoterol (BREZTRI AEROSPHERE) 160-9-4.8 MCG/ACT AERO  09/14/21  Yes [provider]  Diclofenac Sodium (PENNSAID) 2 % SOLN Apply topically as needed.  04/08/20  Yes [provider]  dicyclomine (BENTYL) 20 MG tablet Take 20 mg by mouth 2 (two) times a day.   Yes [provider]  diltiazem (CARDIZEM CD) 180 MG 24 hr capsule TAKE 1 CAPSULE(180 MG) BY MOUTH DAILY 03/15/22  Yes Sherren Mocha, MD  DUPIXENT 300 MG/2ML prefilled syringe Inject into the skin. 10/06/21  Yes [provider]  emtricitabine-tenofovir (TRUVADA) 200-300 MG tablet Take 1 tablet by mouth daily. 05/25/21  Yes [name], MD  emtricitabine-tenofovir (TRUVADA) 200-300 MG tablet Take 1 tablet by mouth daily. 11/13/21  Yes [name], MD  famotidine (PEPCID) 40 MG tablet Take 40 mg by mouth at bedtime.  02/18/20  Yes [provider]  fluticasone (FLONASE) 50 MCG/ACT nasal spray Place 2 sprays into both nostrils daily. 07/12/20  Yes [provider]  hydrocortisone (ANUSOL-HC) 2.5 % rectal cream APPLY RECTALLY TWICE DAILY 06/30/20  Yes [name], MD  ipratropium (ATROVENT) 0.06 % nasal spray Place 2 sprays into the nose 2 (two) times daily. 05/25/21  Yes [name], MD  ipratropium-albuterol (DUONEB) 0.5-2.5 (3) MG/3ML SOLN USE 1 VIAL VIA NEBULIZER EVERY 4 TO 6 HOURS AS NEEDED FOR COUGH OR WHEEZING OR SHORTNESS OF BREATH OR CHEST TIGHTNESS 12/25/21  Yes [provider]  losartan (COZAAR) 25 MG tablet TAKE 1 TABLET(25 MG) BY MOUTH DAILY 04/25/22  Yes [name], MD  metoprolol succinate (TOPROL-XL) 25 MG 24 hr tablet TAKE 1 TABLET(25 MG) BY MOUTH AT BEDTIME 04/25/22  Yes Sherren Mocha, MD  montelukast (SINGULAIR) 10 MG tablet TAKE 1 TABLET(10  MG) BY MOUTH AT BEDTIME 05/25/21  Yes [name], MD  mupirocin ointment (BACTROBAN) 2 % Apply 1 application topically 2 (two) times daily. 11/13/21  Yes [name], MD  Olopatadine-Mometasone Rennie Plowman) [phone] MCG/ACT SUSP Place into the nose.   Yes [provider]  ondansetron (ZOFRAN-ODT) 4 MG disintegrating tablet Take 4 mg by mouth every 8 (eight) hours as needed for nausea or vomiting.   Yes [provider]  pantoprazole (PROTONIX) 40 MG tablet Take 40 mg by mouth 2 (two) times daily.  04/08/20  Yes [provider]  Potassium Chloride ER 20 MEQ TBCR TAKE 1 TABLET BY MOUTH DAILY 08/24/21  Yes Sherren Mocha, MD  pramoxine-hydrocortisone (PROCTOCREAM-HC) 1-1 % rectal cream APPLY TO AFFECTED AREA THREE TIMES DAILY   Yes [provider]  rizatriptan (MAXALT) 10 MG tablet TAKE 1 TABLET BY MOUTH AS NEEDED FOR MIGRAINE. MAY REPEAT IN 2 HOURS IF NEEDED 11/03/21  Yes [name], MD  rosuvastatin (CRESTOR) 10 MG tablet TAKE 1 TABLET(10 MG) BY MOUTH DAILY 03/15/22  Yes  Sherren Mocha, MD  sildenafil (VIAGRA) 100 MG tablet TAKE 1/2 TO 1 TABLET BY MOUTH ONCE DAILY AS NEEDED PRIOR TO SEXUAL ACTIVITY 03/08/22  Yes Sherren Mocha, MD  Spacer/Aero-Holding Josiah Lobo DEVI Use with inhaler 10/11/21  Yes Icard, Octavio Graves, DO  SUMAtriptan (IMITREX) 100 MG tablet Take 100 mg by mouth (1 tablet) at for signs of migraine headache. May take 1 more dose 2 hours later. Do not exceed 200 mg in 24 hours. 12/20/20  Yes [provider]  tamsulosin (FLOMAX) 0.4 MG CAPS capsule Take 1 capsule (0.4 mg total) by mouth daily as needed. 04/06/22  Yes [name], MD  testosterone cypionate (DEPOTESTOTERONE CYPIONATE) 100 MG/ML injection Inject 0.75 mg into the muscle every 7 (seven) days. For IM use only   Yes [provider]  Vitamin D, Ergocalciferol, (DRISDOL) 1.25 MG (50000 UNIT) CAPS capsule Take 1 capsule (50,000 Units total) by mouth every 7 (seven) days.  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)  Tobacco Use   Smoking status: Never   Smokeless tobacco: Never  Vaping Use   Vaping Use: Never used  Substance and Sexual Activity   Alcohol use: Yes    Alcohol/week: 3.0 standard drinks of alcohol    Types: 3 Standard drinks or equivalent per week   Drug use: No   Sexual activity: Yes  Other Topics Concern   Not on file  Social History Narrative   Not on file   Social Determinants of Health   Financial Resource Strain: Not on file  Food Insecurity: Not on file  Transportation Needs: Not on file  Physical Activity: Not on file  Stress: Not on file  Social Connections: Not on file  Intimate Partner Violence: Not on file    Review of Systems Per HPI  Objective:   Vitals:   04/25/22 1612  BP: 128/80  Pulse: 71  Resp: 16  Temp: 98.1 F (36.7 C)  TempSrc: Temporal  SpO2: 95%  Weight: 254 lb 12.8 oz (115.6 kg)  Height: '5\' 11"'$  (1.803 m)     Physical Exam Vitals reviewed.  Constitutional:      Appearance: He is well-developed.  HENT:     Head: Normocephalic and atraumatic.  Neck:     Vascular: No carotid bruit or JVD.  Cardiovascular:     Rate and Rhythm: Normal rate and regular rhythm.     Heart sounds: Normal heart sounds. No murmur heard. Pulmonary:     Effort: Pulmonary effort is normal.     Breath sounds: Normal breath sounds. No rales.  Abdominal:     General: There is no distension.     Tenderness: There is abdominal tenderness (diffuse, lower). There is no right CVA tenderness, left CVA tenderness or rebound.  Musculoskeletal:      Right lower leg: No edema.     Left lower leg: No edema.  Skin:    General: Skin is warm and dry.  Neurological:     Mental Status: He is alert and oriented to person, place, and time.  Psychiatric:  Mood and Affect: Mood normal.        Assessment & Plan:  Rafan Sanders. is a 51 y.o. male . Hematuria, unspecified type - Plan: Urine cytology ancillary only  Dark urine - Plan: POCT urinalysis dipstick, Urine cytology ancillary only  Diverticulitis  Routine screening for STI (sexually transmitted infection)  Lower abdominal pain, ER visit noted as above including CT results with possible diverticulitis.  No mention of nephrolith.  Trace blood on urinalysis, noted again today without signs of infection with negative nitrite/LE.  He did request STI screening but less likely cause.  Previously referred to urology for urinary frequency, recommended discussion of hematuria with further testing at urology.   - Slight improvement on Augmentin, afebrile.  Diarrhea today likely related to illness versus side effect from Augmentin.  RTC precautions discussed.  Bland diet after improvement but liquids recommended primarily for now.  Tramadol if needed for more severe pain but judicious use discussed as well as potential side effects and risks with his other medications.  Update on symptoms next few days but recheck in 1 week, urgent care/ER precautions were given.  Meds ordered this encounter  Medications   traMADol (ULTRAM) 50 MG tablet    Sig: Take 1 tablet (50 mg total) by mouth every 8 (eight) hours as needed for up to 5 days.    Dispense:  10 tablet    Refill:  0   Patient Instructions  Continue Augmentin for diverticulitis as prescribed by gastroenterology.  Bland foods, fluids are most important.  No sign of infection on the urine test but with the small amount of blood noted, should be followed up with urology -  should not need another referral but let me know if one is  needed. Recheck in 1 week, sooner or to urgent care/ER sooner if needed. Tramadol only if needed.   Here is the number for [facility]: [facility] Address: [address], [place], [place] [address] Phone: [phone]     Signed,   [name], MD [place] 04/25/22 5:14 PM

## 2022-04-26 ENCOUNTER — Encounter: Payer: Self-pay | Admitting: Family Medicine

## 2022-04-26 DIAGNOSIS — J329 Chronic sinusitis, unspecified: Secondary | ICD-10-CM | POA: Diagnosis not present

## 2022-04-26 DIAGNOSIS — J342 Deviated nasal septum: Secondary | ICD-10-CM | POA: Diagnosis not present

## 2022-04-26 NOTE — Telephone Encounter (Signed)
Follow up from pt appt 04/25/22

## 2022-04-27 ENCOUNTER — Other Ambulatory Visit: Payer: Self-pay | Admitting: Family Medicine

## 2022-04-27 DIAGNOSIS — J309 Allergic rhinitis, unspecified: Secondary | ICD-10-CM

## 2022-04-27 DIAGNOSIS — J45909 Unspecified asthma, uncomplicated: Secondary | ICD-10-CM

## 2022-04-27 LAB — URINE CYTOLOGY ANCILLARY ONLY
Chlamydia: NEGATIVE
Comment: NEGATIVE
Comment: NEGATIVE
Comment: NORMAL
Neisseria Gonorrhea: NEGATIVE
Trichomonas: NEGATIVE

## 2022-04-30 DIAGNOSIS — G4730 Sleep apnea, unspecified: Secondary | ICD-10-CM | POA: Diagnosis not present

## 2022-04-30 DIAGNOSIS — Z125 Encounter for screening for malignant neoplasm of prostate: Secondary | ICD-10-CM | POA: Diagnosis not present

## 2022-04-30 DIAGNOSIS — E230 Hypopituitarism: Secondary | ICD-10-CM | POA: Diagnosis not present

## 2022-04-30 DIAGNOSIS — E669 Obesity, unspecified: Secondary | ICD-10-CM | POA: Diagnosis not present

## 2022-05-02 ENCOUNTER — Encounter: Payer: Self-pay | Admitting: Family Medicine

## 2022-05-02 ENCOUNTER — Other Ambulatory Visit: Payer: Self-pay | Admitting: Family Medicine

## 2022-05-02 ENCOUNTER — Ambulatory Visit
Admission: RE | Admit: 2022-05-02 | Discharge: 2022-05-02 | Disposition: A | Discharge status: Home / Self Care | Payer: BC Managed Care – PPO | Source: Ambulatory Visit | Attending: Family Medicine | Admitting: Family Medicine

## 2022-05-02 ENCOUNTER — Other Ambulatory Visit: Payer: Self-pay

## 2022-05-02 ENCOUNTER — Ambulatory Visit: Payer: BC Managed Care – PPO | Admitting: Family Medicine

## 2022-05-02 VITALS — BP 120/70 | HR 100 | Temp 98.0°F | Resp 16 | Ht 71.0 in | Wt 252.8 lb

## 2022-05-02 DIAGNOSIS — R1013 Epigastric pain: Secondary | ICD-10-CM | POA: Diagnosis not present

## 2022-05-02 DIAGNOSIS — K5792 Diverticulitis of intestine, part unspecified, without perforation or abscess without bleeding: Secondary | ICD-10-CM

## 2022-05-02 DIAGNOSIS — K7689 Other specified diseases of liver: Secondary | ICD-10-CM | POA: Diagnosis not present

## 2022-05-02 DIAGNOSIS — R350 Frequency of micturition: Secondary | ICD-10-CM

## 2022-05-02 DIAGNOSIS — R339 Retention of urine, unspecified: Secondary | ICD-10-CM

## 2022-05-02 DIAGNOSIS — K429 Umbilical hernia without obstruction or gangrene: Secondary | ICD-10-CM | POA: Diagnosis not present

## 2022-05-02 DIAGNOSIS — R1032 Left lower quadrant pain: Secondary | ICD-10-CM | POA: Diagnosis not present

## 2022-05-02 DIAGNOSIS — N419 Inflammatory disease of prostate, unspecified: Secondary | ICD-10-CM

## 2022-05-02 DIAGNOSIS — R001 Bradycardia, unspecified: Secondary | ICD-10-CM

## 2022-05-02 DIAGNOSIS — J3489 Other specified disorders of nose and nasal sinuses: Secondary | ICD-10-CM

## 2022-05-02 DIAGNOSIS — R1084 Generalized abdominal pain: Secondary | ICD-10-CM

## 2022-05-02 DIAGNOSIS — K5732 Diverticulitis of large intestine without perforation or abscess without bleeding: Secondary | ICD-10-CM | POA: Diagnosis not present

## 2022-05-02 DIAGNOSIS — K589 Irritable bowel syndrome without diarrhea: Secondary | ICD-10-CM

## 2022-05-02 DIAGNOSIS — K4090 Unilateral inguinal hernia, without obstruction or gangrene, not specified as recurrent: Secondary | ICD-10-CM | POA: Diagnosis not present

## 2022-05-02 LAB — COMPREHENSIVE METABOLIC PANEL
ALT: 16 U/L (ref 0–53)
AST: 15 U/L (ref 0–37)
Albumin: 4.5 g/dL (ref 3.5–5.2)
Alkaline Phosphatase: 86 U/L (ref 39–117)
BUN: 7 mg/dL (ref 6–23)
CO2: 30 mEq/L (ref 19–32)
Calcium: 9.5 mg/dL (ref 8.4–10.5)
Chloride: 103 mEq/L (ref 96–112)
Creatinine, Ser: 1.12 mg/dL (ref 0.40–1.50)
GFR: 75.2 mL/min (ref >60.00)
Glucose, Bld: 91 mg/dL (ref 70–99)
Potassium: 4.1 mEq/L (ref 3.5–5.1)
Sodium: 139 mEq/L (ref 135–145)
Total Bilirubin: 0.6 mg/dL (ref 0.2–1.2)
Total Protein: 7 g/dL (ref 6.0–8.3)

## 2022-05-02 LAB — CBC
HCT: 48.2 % (ref 39.0–52.0)
Hemoglobin: 16.4 g/dL (ref 13.0–17.0)
MCHC: 33.9 g/dL (ref 30.0–36.0)
MCV: 88.5 fl (ref 78.0–100.0)
Platelets: 173 10*3/uL (ref 150.0–400.0)
RBC: 5.45 Mil/uL (ref 4.22–5.81)
RDW: 14.7 % (ref 11.5–15.5)
WBC: 5 10*3/uL (ref 4.0–10.5)

## 2022-05-02 LAB — POCT URINALYSIS DIP (MANUAL ENTRY)
Bilirubin, UA: NEGATIVE
Glucose, UA: NEGATIVE mg/dL
Ketones, POC UA: NEGATIVE mg/dL
Leukocytes, UA: NEGATIVE
Nitrite, UA: NEGATIVE
Protein Ur, POC: NEGATIVE mg/dL
Spec Grav, UA: 1.015 (ref 1.010–1.025)
Urobilinogen, UA: 0.2 E.U./dL
pH, UA: 5 (ref 5.0–8.0)

## 2022-05-02 LAB — LIPASE: Lipase: 19 U/L (ref 11.0–59.0)

## 2022-05-02 LAB — POC COVID19 BINAXNOW: SARS Coronavirus 2 Ag: NEGATIVE

## 2022-05-02 LAB — PSA: PSA: 0.58 ng/mL (ref 0.10–4.00)

## 2022-05-02 MED ORDER — IOPAMIDOL (ISOVUE-300) INJECTION 61%
100.0000 mL | Freq: Once | INTRAVENOUS | Status: AC | PRN
  Administered 2022-05-02: 100 mL via INTRAVENOUS

## 2022-05-02 MED ORDER — TAMSULOSIN HCL 0.4 MG PO CAPS: 0.4000 mg | ORAL_CAPSULE | Freq: Every day | ORAL | 0 refills | Status: DC | PRN

## 2022-05-02 MED ORDER — AMOXICILLIN-POT CLAVULANATE 875-125 MG PO TABS: 1.0000 | ORAL_TABLET | Freq: Two times a day (BID) | ORAL | 0 refills | Status: DC

## 2022-05-02 NOTE — Patient Instructions (Addendum)
I will check some labs. If any fever or worsening abdominal pain be seen right away. I will refer you to other gastroenterologist as we discussed.  Keep follow-up planned with cardiology and discuss restart of metoprolol with them.  If you do not hear back from ear nose and throat and any persistent nasal discharge let me know but I would wait to see what medication they had in mind. Labs today, and with worsening abdominal pain will repeat CT scan.  If any worsening symptoms proceed to ER as we discussed. Follow up will be determined by results of labs/studies.

## 2022-05-02 NOTE — Progress Notes (Signed)
Subjective:  Patient ID: Randall Apple., male    DOB: [birth year]  Age: 51 y.o. MRN: [id number]  CC:  Chief Complaint  Patient presents with   Hematuria    Pt reports got better till Sunday but then started getting worse. Having abdominal pains notes frequent urination, and still having diarrhea    Nasal Congestion    Pt notes congestion starting yesterday as well, notes did call to try and see ENT for this got answering service is going to follow up with them later today    Irregular Heart Beat    Pt notes some drops in his heart rate to 40s at random but has been working with cardiology on this, no action required     HPI Randall Reed. presents for   Nasal congestion: Started yesterday. No fever, min cough this am. No bodyache/HA. Clear nasal d/c. No covid test. No known sick contacts. Saw new ENT last week for persistent sinus issues. Called ENT this am - left message. Plan for new nasal spray that ENT will be prescribing.   Irregular heartbeat: Low HR on watch - below 50 at times over past week. Min fluttering in chest at times. Has discussed with cardiologist - [name]. Stopping metoprolol for now for bradycardia. Planning to monitor off metoprolol. Plan to check BP with low HR if recurs.   Abdominal pain/diverticulitis: See last visits, small area of possible diverticulitis on CT 04/23/22. Treated with Augmentin - still taking - 2 more days. GI increased the bentyl form 2 per day to 3 per day. Was improving, then pain worse past few days.  Overall better, still sore in lower abdomen.  Still advancing diet - bread, chicken, soup, crackers.  Drinking fluids, no fevers. Next GI follow up in September. Would like to see different GI. Currently at [facility].   Battle for urination and bowel movements, trouble urinating at times and frequent urination - worse past few days. No visible hematuria. Some pain moving form testicles to anal area. Sore anal area with diarrhea. Painful to  urinate - noted in ER, improved then worsened.  Tx: preparation H suppository.     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/04/2014   rotator cuff tear 10/17/2013   Hypogonadism male 09/04/2013   ED (erectile dysfunction) 07/30/2013   Palpitations 02/21/2012   BMI 33.0-33.9,adult 02/04/2012   Allergic rhinitis 02/04/2012   GERD (gastroesophageal reflux disease) 02/04/2012   Migraine 02/04/2012   GAD (generalized anxiety disorder) 02/04/2012   Insomnia 02/04/2012   Essential hypertension 04/13/2009   CHEST PAIN-UNSPECIFIED 04/13/2009   Past Medical History:  Diagnosis Date   Anxiety    Arthritis    Asthma    Depression    GERD (gastroesophageal reflux disease)    Hyperlipidemia    [employer] 12/05/2020   Seasonal allergic reaction    Past Surgical History:  Procedure Laterality Date   LIPOMA EXCISION N/A 10/30/2018   Procedure: EXCISION OF MULTIPLE SUBCUTANEOUS LIPOMAS ON TORSO;  Surgeon: Donnie Mesa, MD;  Location: [facility];  Service: General;  Laterality: N/A;   none     none     Allergies  Allergen Reactions   Desloratadine Other (See Comments)    CLARINEX-"severe headache"   Loratadine Other (See  Comments)    "severe headache"   Other    Levbid [Hyoscyamine Sulfate] Rash   Prior to Admission medications   Medication Sig Start Date End Date Taking? Authorizing Provider  acetic acid-hydrocortisone (VOSOL-HC) otic solution Place 3 drops into both ears 3 (three) times daily. Use as needed to prevent swimmer's ear 01/10/15   Leandrew Koyanagi, MD  albuterol (VENTOLIN HFA) 108 (90 Base) MCG/ACT inhaler INHALE 2 PUFFS INTO THE LUNGS EVERY 6 HOURS AS NEEDED FOR WHEEZING OR SHORTNESS OF BREATH 07/19/20   [name], MD  [facility] ALLERGY 180 MG tablet Take 180 mg by mouth daily. 07/12/20   [provider]  ALPRAZolam [name]) 1 MG tablet TAKE 1 TABLET(1 MG) BY MOUTH TWICE DAILY AS NEEDED 02/26/22   [name], MD  aspirin EC 81 MG tablet Take 1 tablet (81 mg total) by mouth daily. 03/07/15   Sherren Mocha, MD  benzonatate (TESSALON) 100 MG capsule Take 100-200 mg by mouth every 8 (eight) hours as needed. 01/27/22   [provider]  Budeson-Glycopyrrol-Formoterol (BREZTRI AEROSPHERE) 160-9-4.8 MCG/ACT AERO  09/14/21   [provider]  Diclofenac Sodium (PENNSAID) 2 % SOLN Apply topically as needed.  04/08/20   [provider]  dicyclomine (BENTYL) 20 MG tablet Take 20 mg by mouth 2 (two) times a day.    [provider]  diltiazem (CARDIZEM CD) 180 MG 24 hr capsule TAKE 1 CAPSULE(180 MG) BY MOUTH DAILY 03/15/22   Sherren Mocha, MD  DUPIXENT 300 MG/2ML prefilled syringe Inject into the skin. 10/06/21   [provider]  emtricitabine-tenofovir (TRUVADA) 200-300 MG tablet Take 1 tablet by mouth daily. 05/25/21   [name], MD  emtricitabine-tenofovir (TRUVADA) 200-300 MG tablet Take 1 tablet by mouth daily. 11/13/21   [name], MD  famotidine (PEPCID) 40 MG tablet Take 40 mg by mouth at bedtime.  02/18/20   [provider]  fluticasone (FLONASE) 50 MCG/ACT nasal spray Place 2 sprays into both nostrils daily. 07/12/20   [provider]  hydrocortisone (ANUSOL-HC) 2.5 % rectal cream APPLY RECTALLY TWICE DAILY 06/30/20   [name], MD  ipratropium (ATROVENT) 0.06 % nasal spray Place 2 sprays into the nose 2 (two) times daily. 05/25/21   [name], MD  ipratropium-albuterol (DUONEB) 0.5-2.5 (3) MG/3ML SOLN USE 1 VIAL VIA NEBULIZER EVERY 4 TO 6 HOURS AS NEEDED FOR COUGH OR WHEEZING OR SHORTNESS OF BREATH OR CHEST TIGHTNESS 12/25/21   [provider]  losartan (COZAAR) 25 MG tablet TAKE 1 TABLET(25 MG) BY MOUTH DAILY  04/25/22   [name], MD  metoprolol succinate (TOPROL-XL) 25 MG 24 hr tablet TAKE 1 TABLET(25 MG) BY MOUTH AT BEDTIME 04/25/22   Sherren Mocha, MD  montelukast (SINGULAIR) 10 MG tablet TAKE 1 TABLET(10 MG) BY MOUTH AT BEDTIME 04/27/22   [name], MD  mupirocin ointment (BACTROBAN) 2 % Apply 1 application topically 2 (two) times daily. 11/13/21   [name], MD  Olopatadine-Mometasone Rennie Plowman) [phone] MCG/ACT SUSP Place into the nose.    [provider]  ondansetron (ZOFRAN-ODT) 4 MG disintegrating tablet Take 4 mg by mouth every 8 (eight) hours as needed for nausea or vomiting.    [provider]  pantoprazole (PROTONIX) 40 MG tablet Take 40 mg by mouth 2 (two) times daily.  04/08/20   [provider]  Potassium Chloride ER 20 MEQ TBCR TAKE 1 TABLET BY MOUTH DAILY 08/24/21   Sherren Mocha,  MD  pramoxine-hydrocortisone (PROCTOCREAM-HC) 1-1 % rectal cream APPLY TO AFFECTED AREA THREE TIMES DAILY    [provider]  rizatriptan (MAXALT) 10 MG tablet TAKE 1 TABLET BY MOUTH AS NEEDED FOR MIGRAINE. MAY REPEAT IN 2 HOURS IF NEEDED 11/03/21   [name], MD  rosuvastatin (CRESTOR) 10 MG tablet TAKE 1 TABLET(10 MG) BY MOUTH DAILY 03/15/22   Sherren Mocha, MD  sildenafil (VIAGRA) 100 MG tablet TAKE 1/2 TO 1 TABLET BY MOUTH ONCE DAILY AS NEEDED PRIOR TO SEXUAL ACTIVITY 03/08/22   Sherren Mocha, MD  Spacer/Aero-Holding Josiah Lobo DEVI Use with inhaler 10/11/21   Icard, Octavio Graves, DO  SUMAtriptan (IMITREX) 100 MG tablet Take 100 mg by mouth (1 tablet) at for signs of migraine headache. May take 1 more dose 2 hours later. Do not exceed 200 mg in 24 hours. 12/20/20   [provider]  tamsulosin (FLOMAX) 0.4 MG CAPS capsule Take 1 capsule (0.4 mg total) by mouth daily as needed. 04/06/22   [name], MD  testosterone cypionate (DEPOTESTOTERONE CYPIONATE) 100 MG/ML injection Inject 0.75 mg into the muscle every 7 (seven) days. For IM use  only    [provider]  Vitamin D, Ergocalciferol, (DRISDOL) 1.25 MG (50000 UNIT) CAPS capsule Take 1 capsule (50,000 Units total) by mouth every 7 (seven) days. 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)  Tobacco Use   Smoking status: Never   Smokeless tobacco: Never  Vaping Use   Vaping Use: Never used  Substance and Sexual Activity   Alcohol use: Yes    Alcohol/week: 3.0 standard drinks of alcohol    Types: 3 Standard drinks or equivalent per week   Drug use: No   Sexual activity: Yes  Other Topics Concern   Not on file  Social History Narrative   Not on file   Social Determinants of Health   Financial Resource Strain: Not on file  Food Insecurity: Not on file  Transportation Needs: Not on file  Physical Activity: Not on file  Stress: Not on file  Social Connections: Not on file  Intimate Partner Violence: Not on file    Review of Systems Per HPI.   Objective:   Vitals:   05/02/22 0911  BP: 120/70  Pulse: 100  Resp: 16  Temp: 98 F (36.7 C)  TempSrc: Temporal  SpO2: 96%  Weight: 252 lb 12.8 oz (114.7 kg)  Height: '5\' 11"'$  (1.803 m)     Physical Exam Vitals reviewed.  Constitutional:      Appearance: He is well-developed.  HENT:     Head: Normocephalic and atraumatic.  Neck:     Vascular: No carotid bruit or JVD.  Cardiovascular:     Rate and Rhythm: Normal rate and regular rhythm.     Heart sounds: Normal heart sounds. No murmur heard. Pulmonary:     Effort: Pulmonary effort is normal.     Breath sounds: Normal breath sounds. No rales.  Abdominal:     General: There is no  distension.     Tenderness: There is  abdominal tenderness (Primary left lower quadrant, also suprapubic, epigastric.  Minimal guarding left lower quadrant.  Nondistended.).  Musculoskeletal:     Right lower leg: No edema.     Left lower leg: No edema.  Skin:    General: Skin is warm and dry.  Neurological:     Mental Status: He is alert and oriented to person, place, and time.  Psychiatric:        Mood and Affect: Mood normal.     42 minutes spent during visit, including chart review, exam and discussion of symptoms as above, review of labs and CT imaging results, counseling and assimilation of information, exam, discussion of plan, and chart completion.    Assessment & Plan:  Randall Deriso. is a 51 y.o. male . LLQ pain - Plan: Ambulatory referral to Gastroenterology  Incomplete emptying of bladder - Plan: POCT urinalysis dipstick, PSA, Urine Culture, Urine Microscopic  Frequent urination - Plan: POCT urinalysis dipstick, PSA, Urine Culture, Urine Microscopic  Diverticulitis - Plan: Ambulatory referral to Gastroenterology, CBC, CT Abdomen Pelvis W Contrast  Rhinorrhea - Plan: POC COVID-19 BinaxNow  Bradycardia  Epigastric abdominal pain - Plan: Ambulatory referral to Gastroenterology, Comprehensive metabolic panel, CBC, Lipase, CT Abdomen Pelvis W Contrast  Generalized abdominal pain - Plan: Ambulatory referral to Gastroenterology, CT Abdomen Pelvis W Contrast  Irritable bowel syndrome, unspecified type - Plan: Ambulatory referral to Gastroenterology, CT Abdomen Pelvis W Contrast  Left lower quadrant abdominal pain - Plan: CT Abdomen Pelvis W Contrast  Repeat CT ordered along with CMP, CBC, urinalysis and lipase for the left-sided abdominal pain.  Currently on treatment for diverticulitis.  Afebrile.     Results noted.   CT indicated persistent sigmoid diverticulitis without abscess and some interval improvement.  CBC improving with resolved leukocytosis.  Urinary  symptoms may be in part due to sigmoid diverticulitis and proximity to bladder.  Reassuring urinalysis.  Has follow-up planned with urology regarding his hematuria.  PSA is normal.  - May need additional antibiotics for diverticulitis but also recommended reverting to fluid diet for now to see if that will lessen some of his symptoms.  I will extend Augmentin for 1 additional week, with potential risks of prolonged antibiotics including C. difficile discussed on MyChart message.  ER/urgent care precautions if any further worsening but update on symptoms next few days by MyChart.  Continue follow-up with cardiology regarding bradycardia and holding beta-blocker for now.  Plan further information from ENT regarding new nasal spray, COVID testing negative.  If no info from ENT, advised to let me know and we can discuss other options.  8:06 PM Mychart results note sent and called patient - all questions answered.   No orders of the defined types were placed in this encounter.  Patient Instructions  I will check some labs. If any fever or worsening abdominal pain be seen right away. I will refer you to other gastroenterologist as we discussed.  Keep follow-up planned with cardiology and discuss restart of metoprolol with them.  If you do not hear back from ear nose and throat and any persistent nasal discharge let me know but I would wait to see what medication they had in mind. Labs today, and with worsening abdominal pain will repeat CT scan.  If any worsening symptoms proceed to ER as we discussed. Follow up will be determined by results of labs/studies.       Signed,   [name], MD [place], [facility] 05/02/22 10:11 AM

## 2022-05-03 ENCOUNTER — Other Ambulatory Visit: Payer: Self-pay | Admitting: Family Medicine

## 2022-05-03 LAB — URINE CULTURE
MICRO NUMBER:: 13584378
Result:: NO GROWTH
SPECIMEN QUALITY:: ADEQUATE

## 2022-05-03 LAB — URINALYSIS, MICROSCOPIC ONLY

## 2022-05-04 ENCOUNTER — Encounter: Payer: Self-pay | Admitting: Family Medicine

## 2022-05-04 DIAGNOSIS — K644 Residual hemorrhoidal skin tags: Secondary | ICD-10-CM

## 2022-05-04 MED ORDER — HYDROCORTISONE ACE-PRAMOXINE 1-1 % EX CREA: TOPICAL_CREAM | Freq: Three times a day (TID) | CUTANEOUS | 1 refills | Status: DC

## 2022-05-07 MED ORDER — HYDROCORTISONE ACE-PRAMOXINE 1-1 % EX CREA: TOPICAL_CREAM | Freq: Three times a day (TID) | CUTANEOUS | 1 refills | Status: DC

## 2022-05-07 NOTE — Telephone Encounter (Signed)
Pt reports continued but slightly improved pains and reports inconsistent bowel movements asking for a refill rectal cream for hemorrhoid irritation  Pt reports congestion in his chest now from sinuses, should I have him make a virtual for congestion?

## 2022-05-07 NOTE — Telephone Encounter (Signed)
Make sure he is using the new nasal spray from ENT.  Okay to use Mucinex for chest congestion.  If any fevers or shortness of breath or symptoms not improving in the next week then I would recommend he be evaluated.  If you would like to be seen sooner, okay to schedule virtual or in person visit with any provider in my absence.  Thanks.   I will refill the cream for hemorrhoidal irritation.

## 2022-05-09 DIAGNOSIS — J301 Allergic rhinitis due to pollen: Secondary | ICD-10-CM | POA: Diagnosis not present

## 2022-05-09 DIAGNOSIS — J3089 Other allergic rhinitis: Secondary | ICD-10-CM | POA: Diagnosis not present

## 2022-05-14 ENCOUNTER — Ambulatory Visit (INDEPENDENT_AMBULATORY_CARE_PROVIDER_SITE_OTHER): Payer: BC Managed Care – PPO | Admitting: Family Medicine

## 2022-05-14 ENCOUNTER — Encounter: Payer: Self-pay | Admitting: Family Medicine

## 2022-05-14 VITALS — BP 128/70 | HR 88 | Temp 98.2°F | Resp 16 | Ht 71.0 in | Wt 248.0 lb

## 2022-05-14 DIAGNOSIS — Z87898 Personal history of other specified conditions: Secondary | ICD-10-CM

## 2022-05-14 DIAGNOSIS — J3489 Other specified disorders of nose and nasal sinuses: Secondary | ICD-10-CM

## 2022-05-14 DIAGNOSIS — Z5181 Encounter for therapeutic drug level monitoring: Secondary | ICD-10-CM | POA: Diagnosis not present

## 2022-05-14 DIAGNOSIS — F411 Generalized anxiety disorder: Secondary | ICD-10-CM | POA: Diagnosis not present

## 2022-05-14 DIAGNOSIS — E559 Vitamin D deficiency, unspecified: Secondary | ICD-10-CM

## 2022-05-14 DIAGNOSIS — Z79899 Other long term (current) drug therapy: Secondary | ICD-10-CM | POA: Diagnosis not present

## 2022-05-14 DIAGNOSIS — K5792 Diverticulitis of intestine, part unspecified, without perforation or abscess without bleeding: Secondary | ICD-10-CM

## 2022-05-14 DIAGNOSIS — F5104 Psychophysiologic insomnia: Secondary | ICD-10-CM

## 2022-05-14 DIAGNOSIS — R1032 Left lower quadrant pain: Secondary | ICD-10-CM

## 2022-05-14 LAB — HEMOGLOBIN A1C: Hgb A1c MFr Bld: 5.2 % (ref 4.6–6.5)

## 2022-05-14 MED ORDER — EMTRICITABINE-TENOFOVIR DF 200-300 MG PO TABS
1.0000 | ORAL_TABLET | Freq: Every day | ORAL | 1 refills | Status: DC
Start: 2022-05-14 — End: 2022-11-15

## 2022-05-14 MED ORDER — ALPRAZOLAM 1 MG PO TABS: ORAL_TABLET | ORAL | 1 refills | Status: DC

## 2022-05-14 NOTE — Progress Notes (Signed)
Subjective:  Patient ID: [name], male    DOB: 1971-01-03  Age: 51 y.o. MRN: [id number]  CC:  Chief Complaint  Patient presents with   Abdominal Pain    Pt has been struggling with LLQ pain notes better today than past but not resolved    Diabetes   Anxiety    HPI [name] presents for   Diverticulitis, left lower quadrant pain. See recent visits.  Small area of diverticulitis on CT 04/23/2022.  Treated with Augmentin.  Followed by gastroenterology, dental increase from 2/day to 3/day due to pain, cramping.  Pain was improving and then worsened few days prior to his last visit with me on June 28.  He had been advancing diet at that time.  Repeat CT on 05/02/2022 indicated interval improvement but with some residual left lower quadrant sigmoid diverticulitis compared to June 19.  Negative for abscess.  Augmentin prescribed for additional 1 week. Still having some left lower quadrant pain but improving.  No fevers.  No nausea or vomiting. Finished augmentin 2 days ago. Only intermittent 3rd dose of bentyl,usually twice now.  gradually increasing diet - chicken, some bland foods. No diarrhea. Last BM this am. No blood. Will be following up with new GI - waiting on appt. Sees Urology 8/3.    Prediabetes: History of prediabetes, unable to tolerate metformin due to IBS.  Has been followed by endocrinology for hypogonadism.  Last A1c here in January was normal. Slight increased blood sugar in past. No meds.  Lab Results  Component Value Date   HGBA1C 5.2 05/14/2022   Anxiety With insomnia.  Generalized anxiety disorder.  Unable to tolerate multiple different SSRI trials.  Overall has been well controlled with use of alprazolam although we have discussed potential risks of this medication and plan for continued attempts at tapering.  Has taken 1 to 3/day depending on stress, Ambien in the evening for sleep for insomnia, not combining with Xanax.  Controlled substance  database reviewed.  Tramadol 50 mg #10 on June 21.  Ambien No. 90 on June 13, alprazolam No. 60 on May 23.  Recently has required 2-3 per day.  No illicit drug use.  No parasomnias, no sleep walking, well rested during the day.   Preexposure prophylaxis  Takes Truvada daily.  Recent urine testing for STI screening without concerns, nonreactive HIV, RPR in May. No new partners since last testing.  Lab Results  Component Value Date   CREATININE 1.12 05/02/2022   Rhinorrhea: Mometasone mupirocin nasal wash BID from ENT. Past 4 days. No changes yet.   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   rotator cuff tear 10/17/2013   Hypogonadism male 09/04/2013   ED (erectile dysfunction) 07/30/2013   Palpitations 02/21/2012   BMI 33.0-33.9,adult 02/04/2012   Allergic rhinitis 02/04/2012   GERD (gastroesophageal reflux disease) 02/04/2012   Migraine 02/04/2012   GAD (generalized anxiety disorder) 02/04/2012   Insomnia 02/04/2012   Essential hypertension 04/13/2009   CHEST PAIN-UNSPECIFIED 04/13/2009   Past Medical History:  Diagnosis Date   Anxiety    Arthritis    Asthma    Depression    GERD (gastroesophageal reflux disease)    Hyperlipidemia    Phreesia 12/05/2020   Seasonal allergic reaction  Past Surgical History:  Procedure Laterality Date   LIPOMA EXCISION N/A 10/30/2018   Procedure: EXCISION OF MULTIPLE SUBCUTANEOUS LIPOMAS ON TORSO;  Surgeon: Donnie Mesa, MD;  Location: [place];  Service: General;  Laterality: N/A;   none     none     Allergies  Allergen Reactions   Desloratadine Other (See Comments)    CLARINEX-"severe headache"   Loratadine Other (See Comments)    "severe headache"   Other     Levbid [Hyoscyamine Sulfate] Rash   Prior to Admission medications   Medication Sig Start Date End Date Taking? Authorizing Provider  acetic acid-hydrocortisone (VOSOL-HC) otic solution Place 3 drops into both ears 3 (three) times daily. Use as needed to prevent swimmer's ear 01/10/15  Yes Leandrew Koyanagi, MD  albuterol (VENTOLIN HFA) 108 (90 Base) MCG/ACT inhaler INHALE 2 PUFFS INTO THE LUNGS EVERY 6 HOURS AS NEEDED FOR WHEEZING OR SHORTNESS OF BREATH 07/19/20  Yes [name], MD  [facility] ALLERGY 180 MG tablet Take 180 mg by mouth daily. 07/12/20  Yes [provider]  ALPRAZolam [name]) 1 MG tablet TAKE 1 TABLET(1 MG) BY MOUTH TWICE DAILY AS NEEDED 02/26/22  Yes [name], MD  aspirin EC 81 MG tablet Take 1 tablet (81 mg total) by mouth daily. 03/07/15  Yes Sherren Mocha, MD  Budeson-Glycopyrrol-Formoterol (BREZTRI AEROSPHERE) 160-9-4.8 MCG/ACT AERO  09/14/21  Yes [provider]  Diclofenac Sodium (PENNSAID) 2 % SOLN Apply topically as needed.  04/08/20  Yes [provider]  dicyclomine (BENTYL) 20 MG tablet Take 20 mg by mouth 2 (two) times a day.   Yes [provider]  diltiazem (CARDIZEM CD) 180 MG 24 hr capsule TAKE 1 CAPSULE(180 MG) BY MOUTH DAILY 03/15/22  Yes Sherren Mocha, MD  DUPIXENT 300 MG/2ML prefilled syringe Inject into the skin. 10/06/21  Yes [provider]  emtricitabine-tenofovir (TRUVADA) 200-300 MG tablet Take 1 tablet by mouth daily. 05/25/21  Yes [name], MD  emtricitabine-tenofovir (TRUVADA) 200-300 MG tablet Take 1 tablet by mouth daily. 11/13/21  Yes [name], MD  famotidine (PEPCID) 40 MG tablet Take 40 mg by mouth at bedtime.  02/18/20  Yes [provider]  fluticasone (FLONASE) 50 MCG/ACT nasal spray Place 2 sprays into both nostrils daily. 07/12/20  Yes [provider]  hydrocortisone (ANUSOL-HC) 2.5 % rectal cream APPLY RECTALLY TWICE DAILY 06/30/20  Yes [name], MD   ipratropium (ATROVENT) 0.06 % nasal spray Place 2 sprays into the nose 2 (two) times daily. 05/25/21  Yes [name], MD  ipratropium-albuterol (DUONEB) 0.5-2.5 (3) MG/3ML SOLN USE 1 VIAL VIA NEBULIZER EVERY 4 TO 6 HOURS AS NEEDED FOR COUGH OR WHEEZING OR SHORTNESS OF BREATH OR CHEST TIGHTNESS 12/25/21  Yes [provider]  losartan (COZAAR) 25 MG tablet TAKE 1 TABLET(25 MG) BY MOUTH DAILY 04/25/22  Yes [name], MD  metoprolol succinate (TOPROL-XL) 25 MG 24 hr tablet TAKE 1 TABLET(25 MG) BY MOUTH AT BEDTIME 04/25/22  Yes Sherren Mocha, MD  mometasone-formoterol (DULERA) 100-5 MCG/ACT AERO Inhale 2 puffs into the lungs 2 (two) times daily.   Yes [provider]  montelukast (SINGULAIR) 10 MG tablet TAKE 1 TABLET(10 MG) BY MOUTH AT BEDTIME 04/27/22  Yes [name], MD  mupirocin ointment (BACTROBAN) 2 % Apply 1 application topically 2 (two) times daily. 11/13/21  Yes [name], MD  Olopatadine-Mometasone Rennie Plowman) [phone] MCG/ACT SUSP Place into the nose.   Yes  [provider]  ondansetron (ZOFRAN-ODT) 4 MG disintegrating tablet Take 4 mg by mouth every 8 (eight) hours as needed for nausea or vomiting.   Yes [provider]  pantoprazole (PROTONIX) 40 MG tablet Take 40 mg by mouth 2 (two) times daily.  04/08/20  Yes [provider]  Potassium Chloride ER 20 MEQ TBCR TAKE 1 TABLET BY MOUTH DAILY 08/24/21  Yes Sherren Mocha, MD  pramoxine-hydrocortisone (PROCTOCREAM-HC) 1-1 % rectal cream Place rectally 3 (three) times daily. 05/07/22  Yes [name], MD  rizatriptan (MAXALT) 10 MG tablet TAKE 1 TABLET BY MOUTH AS NEEDED FOR MIGRAINE. MAY REPEAT IN 2 HOURS IF NEEDED 11/03/21  Yes [name], MD  rosuvastatin (CRESTOR) 10 MG tablet TAKE 1 TABLET(10 MG) BY MOUTH DAILY 03/15/22  Yes Sherren Mocha, MD  sildenafil (VIAGRA) 100 MG tablet TAKE 1/2 TO 1 TABLET BY MOUTH ONCE DAILY AS NEEDED PRIOR TO SEXUAL ACTIVITY 03/08/22  Yes  Sherren Mocha, MD  Spacer/Aero-Holding Josiah Lobo DEVI Use with inhaler 10/11/21  Yes Icard, Octavio Graves, DO  SUMAtriptan (IMITREX) 100 MG tablet Take 100 mg by mouth (1 tablet) at for signs of migraine headache. May take 1 more dose 2 hours later. Do not exceed 200 mg in 24 hours. 12/20/20  Yes [provider]  tamsulosin (FLOMAX) 0.4 MG CAPS capsule TAKE 1 CAPSULE(0.4 MG) BY MOUTH DAILY AS NEEDED 05/02/22  Yes [name], MD  testosterone cypionate (DEPOTESTOTERONE CYPIONATE) 100 MG/ML injection Inject 0.75 mg into the muscle every 7 (seven) days. For IM use only   Yes [provider]  Vitamin D, Ergocalciferol, (DRISDOL) 1.25 MG (50000 UNIT) CAPS capsule Take 1 capsule (50,000 Units total) by mouth every 7 (seven) days. 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)  Tobacco Use   Smoking status: Never   Smokeless tobacco: Never  Vaping Use   Vaping Use: Never used  Substance and Sexual Activity   Alcohol use: Yes    Alcohol/week: 3.0 standard drinks of alcohol    Types: 3 Standard drinks or equivalent per week   Drug use: No   Sexual activity: Yes  Other Topics Concern   Not on file  Social History Narrative   Not on file   Social Determinants of Health   Financial Resource Strain: Not on file  Food Insecurity: Not on file  Transportation Needs: Not on file  Physical Activity: Not on file  Stress: Not on file  Social Connections: Not on file  Intimate Partner Violence: Not on file    Review of Systems Per HPI.   Objective:   Vitals:   05/14/22 1025  BP: 128/70  Pulse: 88  Resp:  16  Temp: 98.2 F (36.8 C)  TempSrc: Temporal  SpO2: 97%  Weight: 248 lb (112.5 kg)  Height: '5\' 11"'$  (1.803 m)     Physical Exam Vitals reviewed.  Constitutional:      Appearance: He is well-developed.  HENT:     Head: Normocephalic and atraumatic.  Neck:     Vascular: No carotid bruit  or JVD.  Cardiovascular:     Rate and Rhythm: Normal rate and regular rhythm.     Heart sounds: Normal heart sounds. No murmur heard. Pulmonary:     Effort: Pulmonary effort is normal.     Breath sounds: Normal breath sounds. No rales.  Abdominal:     General: Abdomen is flat. There is no distension.     Tenderness: There is abdominal tenderness in the left lower quadrant. There is no guarding or rebound.  Musculoskeletal:     Right lower leg: No edema.     Left lower leg: No edema.  Skin:    General: Skin is warm and dry.  Neurological:     Mental Status: He is alert and oriented to person, place, and time.  Psychiatric:        Mood and Affect: Mood normal.        Assessment & Plan:  Aryaan Persichetti. is a 51 y.o. male . Diverticulitis LLQ pain  -Improving, continue bland diet with RTC precautions, follow-up with gastroenterology as planned, waiting on new appointment.  Psychophysiological insomnia GAD (generalized anxiety disorder) - Plan: ALPRAZolam (XANAX) 1 MG tablet Medication monitoring encounter - Plan: ToxASSURE Select 13 (MW), Urine  -Stable with current regimen, continue alprazolam and Ambien with avoidance of additive meds.  Potential side effects and risks of these medications have been discussed.  Controlled substance database reviewed as above.  Check UDS.  On pre-exposure prophylaxis for HIV - Plan: emtricitabine-tenofovir (TRUVADA) 200-300 MG tablet  -Check renal function.  Recent STI screening reassuring and without new partners we will hold on further testing today.  Continue Truvada.  Rhinorrhea  -Now on new nasal wash as above.  Continue follow-up with  ENT.  History of prediabetes - Plan: Hemoglobin A1c, CANCELED: POCT glycosylated hemoglobin (Hb A1C)  -Updated A1c ordered, reassuring.  Meds ordered this encounter  Medications   ALPRAZolam (XANAX) 1 MG tablet    Sig: TAKE 1 TABLET(1 MG) BY MOUTH TWICE DAILY AS NEEDED    Dispense:  60 tablet    Refill:  1   emtricitabine-tenofovir (TRUVADA) 200-300 MG tablet    Sig: Take 1 tablet by mouth daily.    Dispense:  90 tablet    Refill:  1   Patient Instructions  Glad to hear that you are improving.  Continue bland diet until pain has improved.  If any worsening symptoms follow-up here or through urgent care/ER if needed. No med changes at this time.  Follow-up in 6 months if everything is stable.  Let me know if there are questions and take care.    Signed,   [name], MD [facility] 05/14/22 11:04 PM

## 2022-05-14 NOTE — Patient Instructions (Signed)
Glad to hear that you are improving.  Continue bland diet until pain has improved.  If any worsening symptoms follow-up here or through urgent care/ER if needed. No med changes at this time.  Follow-up in 6 months if everything is stable.  Let me know if there are questions and take care.

## 2022-05-15 ENCOUNTER — Other Ambulatory Visit: Payer: Self-pay | Admitting: Family Medicine

## 2022-05-15 ENCOUNTER — Telehealth: Payer: Self-pay | Admitting: Gastroenterology

## 2022-05-15 ENCOUNTER — Other Ambulatory Visit: Payer: Self-pay | Admitting: *Deleted

## 2022-05-15 DIAGNOSIS — R7989 Other specified abnormal findings of blood chemistry: Secondary | ICD-10-CM

## 2022-05-15 DIAGNOSIS — J4550 Severe persistent asthma, uncomplicated: Secondary | ICD-10-CM

## 2022-05-15 MED ORDER — SPACER/AERO-HOLDING CHAMBERS DEVI: 0 refills | Status: DC

## 2022-05-15 MED ORDER — VITAMIN D (ERGOCALCIFEROL) 1.25 MG (50000 UNIT) PO CAPS: 50000.0000 [IU] | ORAL_CAPSULE | ORAL | 0 refills | Status: DC

## 2022-05-15 NOTE — Telephone Encounter (Signed)
Good Morning Dr. Fuller Plan,  We have received records for patient to  transfer of care back to you from [facility]. Patient was last seen by you in 1999. Patient was last seen by [name] in 2022. Patient has a referral in for abd pain, diverticulitis, IBS and acid reflux. I will be sending patients records from [facility] for you to review, will you please review and advise on scheduling?  Thank you.

## 2022-05-15 NOTE — Telephone Encounter (Signed)
Repeat Vit D was not completed should Rx be refilled

## 2022-05-16 NOTE — Addendum Note (Signed)
Addended by: Patrcia Dolly on: 05/16/2022 11:30 AM   Modules accepted: Orders

## 2022-05-17 LAB — TOXASSURE SELECT 13 (MW), URINE

## 2022-05-18 ENCOUNTER — Encounter: Payer: Self-pay | Admitting: Family Medicine

## 2022-05-28 NOTE — Telephone Encounter (Signed)
OK to transfer care to me. Please schedule in a New patient appt slot.

## 2022-05-28 NOTE — Telephone Encounter (Signed)
Dr. Fuller Plan,  Patient called wanting an update on transfer of care.    Please advise.

## 2022-05-30 DIAGNOSIS — J301 Allergic rhinitis due to pollen: Secondary | ICD-10-CM | POA: Diagnosis not present

## 2022-05-30 DIAGNOSIS — J3089 Other allergic rhinitis: Secondary | ICD-10-CM | POA: Diagnosis not present

## 2022-05-31 ENCOUNTER — Encounter: Payer: Self-pay | Admitting: Nurse Practitioner

## 2022-06-04 DIAGNOSIS — E230 Hypopituitarism: Secondary | ICD-10-CM | POA: Diagnosis not present

## 2022-06-04 DIAGNOSIS — Z125 Encounter for screening for malignant neoplasm of prostate: Secondary | ICD-10-CM | POA: Diagnosis not present

## 2022-06-07 DIAGNOSIS — E291 Testicular hypofunction: Secondary | ICD-10-CM | POA: Diagnosis not present

## 2022-06-07 DIAGNOSIS — N475 Adhesions of prepuce and glans penis: Secondary | ICD-10-CM | POA: Diagnosis not present

## 2022-06-07 DIAGNOSIS — R350 Frequency of micturition: Secondary | ICD-10-CM | POA: Diagnosis not present

## 2022-06-07 DIAGNOSIS — N529 Male erectile dysfunction, unspecified: Secondary | ICD-10-CM | POA: Diagnosis not present

## 2022-06-17 DIAGNOSIS — L237 Allergic contact dermatitis due to plants, except food: Secondary | ICD-10-CM | POA: Diagnosis not present

## 2022-06-17 DIAGNOSIS — Z6834 Body mass index (BMI) 34.0-34.9, adult: Secondary | ICD-10-CM | POA: Diagnosis not present

## 2022-06-18 ENCOUNTER — Other Ambulatory Visit (INDEPENDENT_AMBULATORY_CARE_PROVIDER_SITE_OTHER): Payer: BC Managed Care – PPO

## 2022-06-18 DIAGNOSIS — E559 Vitamin D deficiency, unspecified: Secondary | ICD-10-CM | POA: Diagnosis not present

## 2022-06-18 LAB — VITAMIN D 25 HYDROXY (VIT D DEFICIENCY, FRACTURES): VITD: 44.58 ng/mL (ref 30.00–100.00)

## 2022-06-20 DIAGNOSIS — J301 Allergic rhinitis due to pollen: Secondary | ICD-10-CM | POA: Diagnosis not present

## 2022-06-20 DIAGNOSIS — J3089 Other allergic rhinitis: Secondary | ICD-10-CM | POA: Diagnosis not present

## 2022-06-21 ENCOUNTER — Encounter: Payer: Self-pay | Admitting: Family Medicine

## 2022-06-21 DIAGNOSIS — R7989 Other specified abnormal findings of blood chemistry: Secondary | ICD-10-CM

## 2022-06-21 MED ORDER — VITAMIN D (ERGOCALCIFEROL) 1.25 MG (50000 UNIT) PO CAPS: 50000.0000 [IU] | ORAL_CAPSULE | ORAL | 0 refills | Status: DC

## 2022-06-21 NOTE — Telephone Encounter (Signed)
Refilled vitamin D

## 2022-06-21 NOTE — Telephone Encounter (Signed)
Pt would like to continue High dose vitamin D due to difference in energy level notes he understands vit d was normal this time but would like to continue anyway's

## 2022-06-22 DIAGNOSIS — K080 Exfoliation of teeth due to systemic causes: Secondary | ICD-10-CM | POA: Diagnosis not present

## 2022-06-25 DIAGNOSIS — M6281 Muscle weakness (generalized): Secondary | ICD-10-CM | POA: Diagnosis not present

## 2022-06-25 DIAGNOSIS — M6289 Other specified disorders of muscle: Secondary | ICD-10-CM | POA: Diagnosis not present

## 2022-06-25 DIAGNOSIS — R102 Pelvic and perineal pain: Secondary | ICD-10-CM | POA: Diagnosis not present

## 2022-06-25 DIAGNOSIS — M62838 Other muscle spasm: Secondary | ICD-10-CM | POA: Diagnosis not present

## 2022-06-27 NOTE — Progress Notes (Signed)
06/28/2022 Markeis Allman [id number] October 20, 1971   CHIEF COMPLAINT: Diverticulitis   HISTORY OF PRESENT ILLNESS: Joaovictor Krone is a 51 year old male with a past medical history of anxiety, depression, obesity, asthma, hypertension, hyperlipidemia, nonobstructive coronary artery disease per coronary CT, paroxysmal tachycardia, GERD and diverticulitis.   He presents to our office today as referred by Dr. Carlota Raspberry for further evaluation regarding diverticulitis, IBS and epigastric pain.  He was remotely seen by Dr. Fuller Plan 1999 - 2003 then by [facility] since 2019. He recently requested to transition his GI management to [facility].   He reported his first episode of diverticulitis confirmed by CT imaging in [place] was in 2019 which resolved after he took a course of Cipro and Flagyl. Over then next few years, he had few "flares" of diverticulitis like pain which lasted for a few days and resolved without treatment. He underwent a colonoscopy by [name] 05/23/2018 which showed diverticulosis in the sigmoid, descending, transverse and ascending colon and two 4 to 5 mm polyps were removed from the descending colon.  He developed LLQ pain 04/11/2022. He contacted [facility] and he was prescribed Cipro and Flagyl x 7 or 10 days over the phone. His LLQ pain initially improved but recurred 4 days after he completed the course of Cipro and Flagyl.  He also noted having dysuria with pelvic pain.  He presented to [place] ED 04/23/2022.  Labs in the ED showed a WBC count of 10.8.  Hemoglobin 17.1.  Urine with trace hemoglobin.  The CT scanner a dry bridge was nonfunctioning therefore he was transferred to [facility] ED.  CTAP showed colonic diverticulosis with questionable uncomplicated acute proximal sigmoid diverticulitis.  He stated he remained at [facility] ED for many hours without seeing a physician which occurred on the day the emergency room had  significant flooding.  He left the ED without being seeing by a physician or PA-C. He contacted [facility] the following day and was prescribed a course of Augmentin.  He also noted having blood in his urine and he was referred to urology.  He underwent a repeat CTAP 05/02/2022 which showed interval improvement with some residual left lower quadrant sigmoid diverticulitis without evidence of an abscess.  He had 2 days left of his Augmentin prescription at that point.   Currently, he has intermittent nausea without vomiting which started 03/2022 and is relieved by taking Ondansetron.  He has heartburn every other day without dysphagia.  He is on Protonix 40 mg twice daily.  On PPI for the past 20 years.  He complains of having intermittent left lower quadrant abdominal pain which radiates to the left upper quadrant and epigastric area.  No abdominal pain today but he had LLQ pain over the past weekend.  Epigastric pain typically occurs 30 minutes after eating.  He is passing 1-3 brown loose to soft stools daily.  No rectal bleeding or black stools.  Yesterday he passed "raisinet like" stools. He reported losing 15 to 20 pounds over the past 2 months.  No fevers, sweats or chills.       Latest Ref Rng & Units 06/28/2022    9:31 AM 05/02/2022   10:14 AM 04/23/2022    5:26 PM  CBC  WBC 4.0 - 10.5 K/uL 7.7  5.0  10.8   Hemoglobin 13.0 - 17.0 g/dL 17.1  16.4  17.1   Hematocrit 39.0 - 52.0 % 50.4  48.2  50.8   Platelets 150.0 - 400.0 K/uL 204.0  173.0  184        Latest Ref Rng & Units 06/28/2022   10:40 AM 05/02/2022   10:14 AM 04/23/2022    5:26 PM  CMP  Glucose 70 - 99 mg/dL 99  91  103   BUN 6 - 23 mg/dL $Remove'11  7  9   'JmApoOu$ Creatinine 0.40 - 1.50 mg/dL 1.09  1.12  1.14   Sodium 135 - 145 mEq/L 138  139  139   Potassium 3.5 - 5.1 mEq/L 4.3  4.1  4.1   Chloride 96 - 112 mEq/L 102  103  105   CO2 19 - 32 mEq/L $Remove'27  30  24   'yKOLCCn$ Calcium 8.4 - 10.5 mg/dL 9.3  9.5  9.8   Total Protein 6.0 - 8.3 g/dL 6.9  7.0  7.7    Total Bilirubin 0.2 - 1.2 mg/dL 0.7  0.6  1.0   Alkaline Phos 39 - 117 U/L 73  86  62   AST 0 - 37 U/L $Remo'16  15  17   'VlGOJ$ ALT 0 - 53 U/L $Remo'19  16  25     'Zkxja$ CTAP 05/02/2022: Interval improvement but with some residual left lower quadrant sigmoid diverticulitis compared 04/23/2022.  Negative for abscess.  CTAP with contrast 04/23/2022: FINDINGS: Lower chest: No acute abnormality.   Hepatobiliary: Similar-appearing 2.7 cm fluid density lesion within the left hepatic lobe that likely represents a simple hepatic cyst. No gallstones, gallbladder wall thickening, or pericholecystic fluid. No biliary dilatation.   Pancreas: No focal lesion. Normal pancreatic contour. No surrounding inflammatory changes. No main pancreatic ductal dilatation.   Spleen: Normal in size without focal abnormality.   Adrenals/Urinary Tract:   No adrenal nodule bilaterally.   Bilateral kidneys enhance symmetrically.   No hydronephrosis. No hydroureter.   The urinary bladder is unremarkable.   On delayed imaging, there is no urothelial wall thickening and there are no filling defects in the opacified portions of the bilateral collecting systems or ureters.   Stomach/Bowel: Stomach is within normal limits. No evidence of small bowel wall thickening or dilatation. Irregular bowel thickening and pericolonic fat stranding along the proximal sigmoid colon (2:89). Scattered colonic diverticulosis. Query elongated colonic diverticula along a non inflate part of the mid to distal sigmoid colon (2:77, 4:89) with no definite bowel perforation. Appendix appears normal.   Vascular/Lymphatic: No abdominal aorta or iliac aneurysm. Mild atherosclerotic plaque of the aorta and its branches. No abdominal, pelvic, or inguinal lymphadenopathy.   Reproductive: Prostate is unremarkable.   Other: No intraperitoneal free fluid. No intraperitoneal free gas. No organized fluid collection.   Musculoskeletal:   Small fat  containing umbilical hernia.   No suspicious lytic or blastic osseous lesions. No acute displaced fracture. Multilevel degenerative changes of the spine.   IMPRESSION: 1. Colonic diverticulosis with query uncomplicated acute diverticulitis. Underlying mass lesion is not excluded. Recommend colonoscopy status post treatment and status post complete resolution of inflammatory changes to exclude an underlying lesion. 2.  Aortic Atherosclerosis (ICD10-I70.0).   Colonoscopy 05/23/2018 by [name]: Diverticulosis in the sigmoid colon, in the descending colon, in the transverse colon and in the ascending colon. Two 4 to 5 mm polyps in the descending colon, removed with a cold snare.  Resected and retrieved. The entire examined colon is normal.  Biopsied.   The distal rectum and anal verge are normal on retroflexion view. Otherwise normal to terminal ileum.  EGD  12/17/2001: Normal   EGD 11/17/199: Normal    Past Medical History:  Diagnosis Date   Anxiety    Arthritis    Asthma    Depression    GERD (gastroesophageal reflux disease)    Hyperlipidemia    Phreesia 12/05/2020   Seasonal allergic reaction    Past Surgical History:  Procedure Laterality Date   LIPOMA EXCISION N/A 10/30/2018   Procedure: EXCISION OF MULTIPLE SUBCUTANEOUS LIPOMAS ON TORSO;  Surgeon: Donnie Mesa, MD;  Location: [facility];  Service: General;  Laterality: N/A;   none     none     Social History:  reports that he has never smoked. He has never used smokeless tobacco. He reports current alcohol use of about 3.0 standard drinks of alcohol per week. He reports that he does not use drugs. On Truvada prophylaxis.   Family History: family history includes Breast cancer in an other family member; Cancer in an other family member; Coronary artery disease in his paternal grandfather; Coronary artery disease (age of onset: 27) in his father; Heart attack in his father, maternal grandfather, and  another family member; Hyperlipidemia in an other family member; Hypertension in an other family member.  No known family history of esophageal, gastric or colon cancer.  Allergies  Allergen Reactions   Desloratadine Other (See Comments)    CLARINEX-"severe headache"   Loratadine Other (See Comments)    "severe headache"   Other    Levbid [Hyoscyamine Sulfate] Rash      Outpatient Encounter Medications as of 06/28/2022  Medication Sig   acetic acid-hydrocortisone (VOSOL-HC) otic solution Place 3 drops into both ears 3 (three) times daily. Use as needed to prevent swimmer's ear   albuterol (VENTOLIN HFA) 108 (90 Base) MCG/ACT inhaler INHALE 2 PUFFS INTO THE LUNGS EVERY 6 HOURS AS NEEDED FOR WHEEZING OR SHORTNESS OF BREATH   ALLEGRA ALLERGY 180 MG tablet Take 180 mg by mouth daily.   ALPRAZolam (XANAX) 1 MG tablet TAKE 1 TABLET(1 MG) BY MOUTH TWICE DAILY AS NEEDED   anastrozole (ARIMIDEX) 1 MG tablet Take 1 mg by mouth daily.   aspirin EC 81 MG tablet Take 1 tablet (81 mg total) by mouth daily.   Budeson-Glycopyrrol-Formoterol (BREZTRI AEROSPHERE) 160-9-4.8 MCG/ACT AERO    Diclofenac Sodium (PENNSAID) 2 % SOLN Apply topically as needed.    dicyclomine (BENTYL) 20 MG tablet Take 20 mg by mouth 2 (two) times a day.   diltiazem (CARDIZEM CD) 180 MG 24 hr capsule TAKE 1 CAPSULE(180 MG) BY MOUTH DAILY   DUPIXENT 300 MG/2ML prefilled syringe Inject into the skin.   emtricitabine-tenofovir (TRUVADA) 200-300 MG tablet Take 1 tablet by mouth daily.   famotidine (PEPCID) 40 MG tablet Take 40 mg by mouth at bedtime.    fluticasone (FLONASE) 50 MCG/ACT nasal spray Place 2 sprays into both nostrils daily.   hydrocortisone (ANUSOL-HC) 2.5 % rectal cream APPLY RECTALLY TWICE DAILY   ipratropium (ATROVENT) 0.06 % nasal spray Place 2 sprays into the nose 2 (two) times daily.   ipratropium-albuterol (DUONEB) 0.5-2.5 (3) MG/3ML SOLN USE 1 VIAL VIA NEBULIZER EVERY 4 TO 6 HOURS AS NEEDED FOR COUGH OR WHEEZING  OR SHORTNESS OF BREATH OR CHEST TIGHTNESS   losartan (COZAAR) 25 MG tablet TAKE 1 TABLET(25 MG) BY MOUTH DAILY   metoprolol succinate (TOPROL-XL) 25 MG 24 hr tablet TAKE 1 TABLET(25 MG) BY MOUTH AT BEDTIME   Molnupiravir (LAGEVRIO PO) Take by mouth.   mometasone-formoterol (DULERA) 100-5 MCG/ACT AERO Inhale  2 puffs into the lungs 2 (two) times daily.   montelukast (SINGULAIR) 10 MG tablet TAKE 1 TABLET(10 MG) BY MOUTH AT BEDTIME   mupirocin ointment (BACTROBAN) 2 % Apply 1 application topically 2 (two) times daily.   Na Sulfate-K Sulfate-Mg Sulf 17.5-3.13-1.6 GM/177ML SOLN Take 1 kit by mouth once for 1 dose.   Olopatadine-Mometasone ([name]) [id number] MCG/ACT SUSP Place into the nose.   ondansetron (ZOFRAN-ODT) 4 MG disintegrating tablet Take 4 mg by mouth every 8 (eight) hours as needed for nausea or vomiting.   pantoprazole (PROTONIX) 40 MG tablet Take 40 mg by mouth 2 (two) times daily.    Potassium Chloride ER 20 MEQ TBCR TAKE 1 TABLET BY MOUTH DAILY   pramoxine-hydrocortisone (PROCTOCREAM-HC) 1-1 % rectal cream Place rectally 3 (three) times daily.   rizatriptan (MAXALT) 10 MG tablet TAKE 1 TABLET BY MOUTH AS NEEDED FOR MIGRAINE. MAY REPEAT IN 2 HOURS IF NEEDED   rosuvastatin (CRESTOR) 10 MG tablet TAKE 1 TABLET(10 MG) BY MOUTH DAILY   sildenafil (VIAGRA) 100 MG tablet TAKE 1/2 TO 1 TABLET BY MOUTH ONCE DAILY AS NEEDED PRIOR TO SEXUAL ACTIVITY   Spacer/Aero-Holding Chambers DEVI Use with inhaler   SUMAtriptan (IMITREX) 100 MG tablet Take 100 mg by mouth (1 tablet) at for signs of migraine headache. May take 1 more dose 2 hours later. Do not exceed 200 mg in 24 hours.   tamsulosin (FLOMAX) 0.4 MG CAPS capsule TAKE 1 CAPSULE(0.4 MG) BY MOUTH DAILY AS NEEDED   testosterone cypionate (DEPOTESTOTERONE CYPIONATE) 100 MG/ML injection Inject 0.75 mg into the muscle every 7 (seven) days. For IM use only   Vitamin D, Ergocalciferol, (DRISDOL) 1.25 MG (50000 UNIT) CAPS capsule Take 1 capsule (50,000  Units total) by mouth every 7 (seven) days.   zolpidem (AMBIEN) 10 MG tablet TAKE 1 TABLET(10 MG) BY MOUTH AT BEDTIME   No facility-administered encounter medications on file as of 06/28/2022.    REVIEW OF SYSTEMS: Gen: Denies fever, sweats or chills. No weight loss.  CV: Denies chest pain, palpitations or edema. Resp: Denies cough, shortness of breath of hemoptysis.  GI: See HPI.   GU : See HPI. MS: Denies joint pain, muscles aches or weakness. Derm: Denies rash, itchiness, skin lesions or unhealing ulcers. Psych: + Anxiety and depression.  Heme: Denies bruising, easy bleeding. Neuro:  Denies headaches, dizziness or paresthesias. Endo:  Denies any problems with DM, thyroid or adrenal function.  PHYSICAL EXAM: BP (!) 136/92   Pulse 88   Ht $R'5\' 11"'ER$  (1.803 m)   Wt 250 lb (113.4 kg)   BMI 34.87 kg/m   Wt Readings from Last 3 Encounters:  06/28/22 250 lb (113.4 kg)  05/14/22 248 lb (112.5 kg)  05/02/22 252 lb 12.8 oz (114.7 kg)    General: 51 year old male in NAD.  Head: Normocephalic and atraumatic. Eyes:  Sclerae non-icteric, conjunctive pink. Ears: Normal auditory acuity. Mouth: Dentition intact. No ulcers or lesions.  Neck: Supple, no lymphadenopathy or thyromegaly.  Lungs: Clear bilaterally to auscultation without wheezes, crackles or rhonchi. Heart: Regular rate and rhythm. No murmur, rub or gallop appreciated.  Abdomen: Soft, Nondistended.  Mild thickening with trace tenderness to the LLQ area without rebound or guarding.  No masses. No hepatosplenomegaly. Normoactive bowel sounds x 4 quadrants.  Rectal: Deferred.  Musculoskeletal: Symmetrical with no gross deformities. Skin: Warm and dry. No rash or lesions on visible extremities. Extremities: No edema. Neurological: Alert oriented x 4, no focal deficits.  Psychological:  Alert  and cooperative. Normal mood and affect.  ASSESSMENT AND PLAN:  49) 51 year old male with LLQ pain 04/11/2022, diagnosed with sigmoid  diverticulitis per CTAP treated with a course of Cipro and Flagyl then Augmentin. Repeat CTAP 05/02/2022 showed interval improvement without evidence of an abscess.  He continues to have intermittent LLQ discomfort, no severe abdominal pain. -CBC, CMP and CRP -May take Bentyl or Levsin as needed -Patient to contact office if abdominal pain worsens, -Patient instructed to go to the ED if he develops severe abdominal pain -Avoid constipation, MiraLAX nightly as needed -Drink 64 ounces of water daily -Colonoscopy benefits and risks discussed including risk with sedation, risk of bleeding, perforation and infection   2) History of GERD with heartburn every other day, intermittent epigastric and LUQ pain -Continue Pantoprazole 40 mg twice daily -Famotidine 20 mg QD as needed -EGD benefits and risks discussed including risk with sedation, risk of bleeding, perforation and infection   3) History of nonobstructive CAD per coronary CT  4) Pelvic pain, hematuria -Continue follow-up with urology   Today's encounter was 40 minutes which included precharting, chart/result review , history/exam, face-to-face time used for counseling, formulating treatment plan with follow-up and documentation       CC:  [name], MD

## 2022-06-28 ENCOUNTER — Ambulatory Visit: Payer: BC Managed Care – PPO | Admitting: Nurse Practitioner

## 2022-06-28 ENCOUNTER — Encounter: Payer: Self-pay | Admitting: Nurse Practitioner

## 2022-06-28 ENCOUNTER — Other Ambulatory Visit (INDEPENDENT_AMBULATORY_CARE_PROVIDER_SITE_OTHER): Payer: BC Managed Care – PPO

## 2022-06-28 VITALS — BP 136/92 | HR 88 | Ht 71.0 in | Wt 250.0 lb

## 2022-06-28 DIAGNOSIS — R1012 Left upper quadrant pain: Secondary | ICD-10-CM | POA: Diagnosis not present

## 2022-06-28 DIAGNOSIS — Z86010 Personal history of colonic polyps: Secondary | ICD-10-CM

## 2022-06-28 DIAGNOSIS — K5732 Diverticulitis of large intestine without perforation or abscess without bleeding: Secondary | ICD-10-CM

## 2022-06-28 DIAGNOSIS — K219 Gastro-esophageal reflux disease without esophagitis: Secondary | ICD-10-CM

## 2022-06-28 LAB — CBC WITH DIFFERENTIAL/PLATELET
Basophils Absolute: 0.1 10*3/uL (ref 0.0–0.1)
Basophils Relative: 0.6 % (ref 0.0–3.0)
Eosinophils Absolute: 0 10*3/uL (ref 0.0–0.7)
Eosinophils Relative: 0.6 % (ref 0.0–5.0)
HCT: 50.4 % (ref 39.0–52.0)
Hemoglobin: 17.1 g/dL — ABNORMAL HIGH (ref 13.0–17.0)
Lymphocytes Relative: 22.3 % (ref 12.0–46.0)
Lymphs Abs: 1.7 10*3/uL (ref 0.7–4.0)
MCHC: 34 g/dL (ref 30.0–36.0)
MCV: 87.6 fl (ref 78.0–100.0)
Monocytes Absolute: 0.8 10*3/uL (ref 0.1–1.0)
Monocytes Relative: 10.1 % (ref 3.0–12.0)
Neutro Abs: 5.1 10*3/uL (ref 1.4–7.7)
Neutrophils Relative %: 66.4 % (ref 43.0–77.0)
Platelets: 204 10*3/uL (ref 150.0–400.0)
RBC: 5.75 Mil/uL (ref 4.22–5.81)
RDW: 15.2 % (ref 11.5–15.5)
WBC: 7.7 10*3/uL (ref 4.0–10.5)

## 2022-06-28 LAB — COMPREHENSIVE METABOLIC PANEL
ALT: 19 U/L (ref 0–53)
AST: 16 U/L (ref 0–37)
Albumin: 4.4 g/dL (ref 3.5–5.2)
Alkaline Phosphatase: 73 U/L (ref 39–117)
BUN: 11 mg/dL (ref 6–23)
CO2: 27 mEq/L (ref 19–32)
Calcium: 9.3 mg/dL (ref 8.4–10.5)
Chloride: 102 mEq/L (ref 96–112)
Creatinine, Ser: 1.09 mg/dL (ref 0.40–1.50)
GFR: 77.6 mL/min (ref >60.00)
Glucose, Bld: 99 mg/dL (ref 70–99)
Potassium: 4.3 mEq/L (ref 3.5–5.1)
Sodium: 138 mEq/L (ref 135–145)
Total Bilirubin: 0.7 mg/dL (ref 0.2–1.2)
Total Protein: 6.9 g/dL (ref 6.0–8.3)

## 2022-06-28 LAB — C-REACTIVE PROTEIN: CRP: <1 mg/dL (ref 0.5–20.0)

## 2022-06-28 MED ORDER — NA SULFATE-K SULFATE-MG SULF 17.5-3.13-1.6 GM/177ML PO SOLN: 1.0000 | Freq: Once | ORAL | 0 refills | Status: AC

## 2022-06-28 NOTE — Patient Instructions (Addendum)
1) DRINK 8 GLASSES OF WATER AS TOLERATED   2) AVOID CONSTIPATION, MAY TAKE MIRALAX 1 CAPFUL MIXED IN 8 OUNCES OF WATER AT BEDTIME IF NEEDED  3) CONTACT OUR OFFICE IF YOUR ABDOMINAL PAIN WORSENS, GO TO THE EMERGENCY ROOM IF YOU DEVELOP SEVERE ABDOMINAL PAIN   We have you scheduled for an endoscopy and colonoscopy.  Your physician has requested that you have lab work today. Please head to the basement lab when you leave today.

## 2022-06-29 DIAGNOSIS — N529 Male erectile dysfunction, unspecified: Secondary | ICD-10-CM | POA: Diagnosis not present

## 2022-06-29 DIAGNOSIS — R350 Frequency of micturition: Secondary | ICD-10-CM | POA: Diagnosis not present

## 2022-06-29 DIAGNOSIS — N475 Adhesions of prepuce and glans penis: Secondary | ICD-10-CM | POA: Diagnosis not present

## 2022-06-29 DIAGNOSIS — E291 Testicular hypofunction: Secondary | ICD-10-CM | POA: Diagnosis not present

## 2022-07-10 DIAGNOSIS — M6281 Muscle weakness (generalized): Secondary | ICD-10-CM | POA: Diagnosis not present

## 2022-07-10 DIAGNOSIS — J301 Allergic rhinitis due to pollen: Secondary | ICD-10-CM | POA: Diagnosis not present

## 2022-07-10 DIAGNOSIS — J3089 Other allergic rhinitis: Secondary | ICD-10-CM | POA: Diagnosis not present

## 2022-07-10 DIAGNOSIS — M62838 Other muscle spasm: Secondary | ICD-10-CM | POA: Diagnosis not present

## 2022-07-10 DIAGNOSIS — M6289 Other specified disorders of muscle: Secondary | ICD-10-CM | POA: Diagnosis not present

## 2022-07-10 DIAGNOSIS — K5902 Outlet dysfunction constipation: Secondary | ICD-10-CM | POA: Diagnosis not present

## 2022-07-16 ENCOUNTER — Other Ambulatory Visit: Payer: Self-pay | Admitting: Family Medicine

## 2022-07-16 DIAGNOSIS — M6289 Other specified disorders of muscle: Secondary | ICD-10-CM | POA: Diagnosis not present

## 2022-07-16 DIAGNOSIS — F5104 Psychophysiologic insomnia: Secondary | ICD-10-CM

## 2022-07-16 DIAGNOSIS — M6281 Muscle weakness (generalized): Secondary | ICD-10-CM | POA: Diagnosis not present

## 2022-07-16 DIAGNOSIS — R102 Pelvic and perineal pain: Secondary | ICD-10-CM | POA: Diagnosis not present

## 2022-07-16 DIAGNOSIS — M62838 Other muscle spasm: Secondary | ICD-10-CM | POA: Diagnosis not present

## 2022-07-17 ENCOUNTER — Ambulatory Visit (AMBULATORY_SURGERY_CENTER): Payer: BC Managed Care – PPO | Admitting: Gastroenterology

## 2022-07-17 ENCOUNTER — Other Ambulatory Visit: Payer: Self-pay | Admitting: Family Medicine

## 2022-07-17 ENCOUNTER — Encounter: Payer: Self-pay | Admitting: Gastroenterology

## 2022-07-17 VITALS — BP 114/73 | HR 69 | Temp 98.7°F | Resp 18 | Ht 71.0 in | Wt 250.0 lb

## 2022-07-17 DIAGNOSIS — K5732 Diverticulitis of large intestine without perforation or abscess without bleeding: Secondary | ICD-10-CM | POA: Diagnosis not present

## 2022-07-17 DIAGNOSIS — K633 Ulcer of intestine: Secondary | ICD-10-CM

## 2022-07-17 DIAGNOSIS — K5100 Ulcerative (chronic) pancolitis without complications: Secondary | ICD-10-CM | POA: Diagnosis not present

## 2022-07-17 DIAGNOSIS — K319 Disease of stomach and duodenum, unspecified: Secondary | ICD-10-CM

## 2022-07-17 DIAGNOSIS — K219 Gastro-esophageal reflux disease without esophagitis: Secondary | ICD-10-CM

## 2022-07-17 DIAGNOSIS — K649 Unspecified hemorrhoids: Secondary | ICD-10-CM | POA: Diagnosis not present

## 2022-07-17 DIAGNOSIS — R933 Abnormal findings on diagnostic imaging of other parts of digestive tract: Secondary | ICD-10-CM

## 2022-07-17 DIAGNOSIS — R1012 Left upper quadrant pain: Secondary | ICD-10-CM

## 2022-07-17 DIAGNOSIS — F411 Generalized anxiety disorder: Secondary | ICD-10-CM

## 2022-07-17 DIAGNOSIS — R1013 Epigastric pain: Secondary | ICD-10-CM

## 2022-07-17 HISTORY — PX: COLONOSCOPY: SHX174

## 2022-07-17 HISTORY — PX: UPPER GASTROINTESTINAL ENDOSCOPY: SHX188

## 2022-07-17 MED ORDER — DICYCLOMINE HCL 10 MG PO CAPS: 10.0000 mg | ORAL_CAPSULE | Freq: Three times a day (TID) | ORAL | 3 refills | Status: DC

## 2022-07-17 MED ORDER — SODIUM CHLORIDE 0.9 % IV SOLN: 500.0000 mL | INTRAVENOUS | Status: DC

## 2022-07-17 NOTE — Progress Notes (Signed)
Pt's states no medical or surgical changes since previsit or office visit. 

## 2022-07-17 NOTE — Telephone Encounter (Signed)
Patient is requesting a refill of the following medications: Requested Prescriptions   Pending Prescriptions Disp Refills   zolpidem (AMBIEN) 10 MG tablet [Pharmacy Med Name: ZOLPIDEM '10MG'$  TABLETS] 90 tablet     Sig: TAKE 1 TABLET(10 MG) BY MOUTH AT BEDTIME    Date of patient request: 07/17/22 Last office visit: 05/14/22 Date of last refill: 04/16/22 Last refill amount: 90

## 2022-07-17 NOTE — Op Note (Signed)
[facility] Patient Name: Randall Reed Procedure Date: 07/17/2022 1:25 PM MRN: [id number] Endoscopist: Ladene Artist , MD Age: 51 Referring MD:  Date of Birth: 1971-08-24 Gender: Male Account #: [id number] Procedure:                Upper GI endoscopy Indications:              Epigastric abdominal pain, Abdominal pain in the                            left upper quadrant, Gastroesophageal reflux disease Medicines:                Monitored Anesthesia Care Procedure:                Pre-Anesthesia Assessment:                           - Prior to the procedure, a History and Physical                            was performed, and patient medications and                            allergies were reviewed. The patient's tolerance of                            previous anesthesia was also reviewed. The risks                            and benefits of the procedure and the sedation                            options and risks were discussed with the patient.                            All questions were answered, and informed consent                            was obtained. Prior Anticoagulants: The patient has                            taken no previous anticoagulant or antiplatelet                            agents. ASA Grade Assessment: II - A patient with                            mild systemic disease. After reviewing the risks                            and benefits, the patient was deemed in                            satisfactory condition to undergo the procedure.                           -  Prior to the procedure, a History and Physical                            was performed, and patient medications and                            allergies were reviewed. The patient's tolerance of                            previous anesthesia was also reviewed. The risks                            and benefits of the procedure and the sedation                            options and  risks were discussed with the patient.                            All questions were answered, and informed consent                            was obtained. Prior Anticoagulants: The patient has                            taken no previous anticoagulant or antiplatelet                            agents. ASA Grade Assessment: II - A patient with                            mild systemic disease. After reviewing the risks                            and benefits, the patient was deemed in                            satisfactory condition to undergo the procedure.                           After obtaining informed consent, the endoscope was                            passed under direct vision. Throughout the                            procedure, the patient's blood pressure, pulse, and                            oxygen saturations were monitored continuously. The                            Endoscope was introduced through the mouth, and  advanced to the second part of duodenum. The upper                            GI endoscopy was accomplished without difficulty.                            The patient tolerated the procedure well. Scope In: Scope Out: Findings:                 The examined esophagus was normal.                           Patchy mildly erythematous mucosa without bleeding                            was found on the greater curvature of the stomach.                            Biopsies were taken with a cold forceps for                            histology.                           The exam of the stomach was otherwise normal.                           The duodenal bulb and second portion of the                            duodenum were normal. Complications:            No immediate complications. Estimated Blood Loss:     Estimated blood loss was minimal. Impression:               - Normal esophagus.                           - Erythematous mucosa in  the greater curvature.                            Biopsied.                           - Normal duodenal bulb and second portion of the                            duodenum. Recommendation:           - Patient has a contact number available for                            emergencies. The signs and symptoms of potential                            delayed complications were discussed with the  patient. Return to normal activities tomorrow.                            Written discharge instructions were provided to the                            patient.                           - Resume previous diet.                           - Follow antireflux measures long term.                           - Continue present medications including                            pantoprazole 40 mg po bid and famotidine 40 mg po                            hs.                           - Await pathology results. Ladene Artist, MD 07/17/2022 2:32:38 PM This report has been signed electronically.

## 2022-07-17 NOTE — Progress Notes (Signed)
To pacu, VSS. Report to [email or web address] 

## 2022-07-17 NOTE — Progress Notes (Signed)
See 06/28/2022 H&P, no changes

## 2022-07-17 NOTE — Patient Instructions (Addendum)
Information on gastritis and diverticulosis given to you today.  Await pathology results from the biopsies taken today.  Use Miralax every day.  Resume previous diet and medications.  Change dicyclomine to 20 mg 3 times per day.  Continue pantoprazole and famotidine.   YOU HAD AN ENDOSCOPIC PROCEDURE TODAY AT [facility]:   Refer to the procedure report that was given to you for any specific questions about what was found during the examination.  If the procedure report does not answer your questions, please call your gastroenterologist to clarify.  If you requested that your care partner not be given the details of your procedure findings, then the procedure report has been included in a sealed envelope for you to review at your convenience later.  YOU SHOULD EXPECT: Some feelings of bloating in the abdomen. Passage of more gas than usual.  Walking can help get rid of the air that was put into your GI tract during the procedure and reduce the bloating. If you had a lower endoscopy (such as a colonoscopy or flexible sigmoidoscopy) you may notice spotting of blood in your stool or on the toilet paper. If you underwent a bowel prep for your procedure, you may not have a normal bowel movement for a few days.  Please Note:  You might notice some irritation and congestion in your nose or some drainage.  This is from the oxygen used during your procedure.  There is no need for concern and it should clear up in a day or so.  SYMPTOMS TO REPORT IMMEDIATELY:  Following lower endoscopy (colonoscopy or flexible sigmoidoscopy):  Excessive amounts of blood in the stool  Significant tenderness or worsening of abdominal pains  Swelling of the abdomen that is new, acute  Fever of 100F or higher  Following upper endoscopy (EGD)  Vomiting of blood or coffee ground material  New chest pain or pain under the shoulder blades  Painful or persistently difficult swallowing  New shortness of  breath  Fever of 100F or higher  Black, tarry-looking stools  For urgent or emergent issues, a gastroenterologist can be reached at any hour by calling [phone]. Do not use MyChart messaging for urgent concerns.    DIET:  We do recommend a small meal at first, but then you may proceed to your regular diet.  Drink plenty of fluids but you should avoid alcoholic beverages for 24 hours.  ACTIVITY:  You should plan to take it easy for the rest of today and you should NOT DRIVE or use heavy machinery until tomorrow (because of the sedation medicines used during the test).    FOLLOW UP: Our staff will call the number listed on your records the next business day following your procedure.  We will call around 7:15- 8:00 am to check on you and address any questions or concerns that you may have regarding the information given to you following your procedure. If we do not reach you, we will leave a message.     If any biopsies were taken you will be contacted by phone or by letter within the next 1-3 weeks.  Please call us at [phone] if you have not heard about the biopsies in 3 weeks.    SIGNATURES/CONFIDENTIALITY: You and/or your care partner have signed paperwork which will be entered into your electronic medical record.  These signatures attest to the fact that that the information above on your After Visit Summary has been reviewed and is understood.  Full responsibility of the confidentiality of this discharge information lies with you and/or your care-partner.  

## 2022-07-17 NOTE — Progress Notes (Signed)
Called to room to assist during endoscopic procedure.  Patient ID and intended procedure confirmed with present staff. Received instructions for my participation in the procedure from the performing physician.  

## 2022-07-17 NOTE — Telephone Encounter (Signed)
Patient is requesting a refill of the following medications: Requested Prescriptions   Pending Prescriptions Disp Refills   ALPRAZolam (XANAX) 1 MG tablet [Pharmacy Med Name: ALPRAZOLAM '1MG'$  TABLETS] 60 tablet     Sig: TAKE 1 TABLET(1 MG) BY MOUTH TWICE DAILY AS NEEDED    Date of patient request: 07/17/22 Last office visit: 05/14/22 Date of last refill: 05/14/22 Last refill amount: 60

## 2022-07-17 NOTE — Telephone Encounter (Signed)
Controlled substance database reviewed.  Alprazolam 1 mg filled 06/14/2022 for #30.  Insomnia discussed in July.  Refill ordered.

## 2022-07-17 NOTE — Telephone Encounter (Signed)
Controlled substance database reviewed, Ambien 10 mg last filled for #90 on 04/17/2022, refill ordered.  Insomnia discussed as well as anxiety at July visit.

## 2022-07-17 NOTE — Op Note (Signed)
[facility] Patient Name: Randall Reed Procedure Date: 07/17/2022 1:24 PM MRN: [id number] Endoscopist: Ladene Artist , MD Age: 51 Referring MD:  Date of Birth: [birth year] Gender: Male Account #: [id number] Procedure:                Colonoscopy Indications:              Abnormal CT of the GI tract (colon), Diverticulitis Medicines:                Monitored Anesthesia Care Procedure:                Pre-Anesthesia Assessment:                           - Prior to the procedure, a History and Physical                            was performed, and patient medications and                            allergies were reviewed. The patient's tolerance of                            previous anesthesia was also reviewed. The risks                            and benefits of the procedure and the sedation                            options and risks were discussed with the patient.                            All questions were answered, and informed consent                            was obtained. Prior Anticoagulants: The patient has                            taken no previous anticoagulant or antiplatelet                            agents. ASA Grade Assessment: II - A patient with                            mild systemic disease. After reviewing the risks                            and benefits, the patient was deemed in                            satisfactory condition to undergo the procedure.                           After obtaining informed consent, the colonoscope  was passed under direct vision. Throughout the                            procedure, the patient's blood pressure, pulse, and                            oxygen saturations were monitored continuously. The                            CF [id number] #[id number] was introduced through the anus                            and advanced to the the terminal ileum, with                            identification  of the appendiceal orifice and IC                            valve. The terminal ileum, ileocecal valve,                            appendiceal orifice, and rectum were photographed.                            The quality of the bowel preparation was adequate                            after extensive lavage, suction. The colonoscopy                            was somewhat difficult due to restricted mobility                            of the left colon. Successful completion of the                            procedure was aided by withdrawing the adult                            colonoscope and replacing with the pediatric                            colonoscope. The patient tolerated the procedure                            well. Scope In: 2:00:56 PM Scope Out: 2:16:12 PM Scope Withdrawal Time: 0 hours 10 minutes 51 seconds  Total Procedure Duration: 0 hours 15 minutes 16 seconds  Findings:                 The perianal and digital rectal examinations were                            normal.  The terminal ileum appeared normal.                           A single (solitary) six mm ulcer was found at the                            ileocecal valve. No bleeding was present. No                            stigmata of recent bleeding were seen. Biopsies                            were taken with a cold forceps for histology.                           Multiple small-mouthed diverticula were found in                            the left colon. There was narrowing of the colon in                            association with the diverticular opening. There                            was evidence of diverticular spasm. There was no                            evidence of diverticular bleeding.                           A benign-appearing, intrinsic moderate stenosis                            measuring 3 cm (in length) was found in the sigmoid                            colon  associated with diverticulosis and was                            traversed.                           External hemorrhoids were found during                            retroflexion. The hemorrhoids were small.                           The exam was otherwise without abnormality on                            direct and retroflexion views. Complications:            No immediate complications. Estimated blood loss:  None. Estimated Blood Loss:     Estimated blood loss: none. Impression:               - The examined portion of the ileum was normal.                           - A single (solitary) ulcer at the ileocecal valve.                            Biopsied.                           - Severe diverticulosis in the left colon. There                            was narrowing of the colon in association with the                            diverticular opening. There was evidence of                            diverticular spasm.                           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                           - External hemorrhoids.                           - The examination was otherwise normal on direct                            and retroflexion views. Recommendation:           - Repeat colonoscopy in 10 years for screening                            purposes.                           - Patient has a contact number available for                            emergencies. The signs and symptoms of potential                            delayed complications were discussed with the                            patient. Return to normal activities tomorrow.                            Written discharge instructions were provided to  the                            patient.                           - High fiber diet.                           - Continue present medications.                           - Miralax  daily.                           - Change dicyclomine to 20 mg po tid ac, 1 year of                            refils.                           - Await pathology results. Ladene Artist, MD 07/17/2022 2:29:33 PM This report has been signed electronically.

## 2022-07-18 ENCOUNTER — Telehealth: Payer: Self-pay | Admitting: *Deleted

## 2022-07-18 NOTE — Telephone Encounter (Signed)
Attempted f/u phone call. No answer. Mailbox is full, unable to leave message.

## 2022-07-20 ENCOUNTER — Other Ambulatory Visit: Payer: Self-pay

## 2022-07-20 DIAGNOSIS — K080 Exfoliation of teeth due to systemic causes: Secondary | ICD-10-CM | POA: Diagnosis not present

## 2022-07-20 DIAGNOSIS — K5732 Diverticulitis of large intestine without perforation or abscess without bleeding: Secondary | ICD-10-CM

## 2022-07-20 MED ORDER — DICYCLOMINE HCL 20 MG PO TABS: 20.0000 mg | ORAL_TABLET | Freq: Three times a day (TID) | ORAL | 5 refills | Status: DC

## 2022-07-20 MED ORDER — DICYCLOMINE HCL 20 MG PO TABS: 20.0000 mg | ORAL_TABLET | Freq: Three times a day (TID) | ORAL | 11 refills | Status: DC

## 2022-07-24 DIAGNOSIS — M6289 Other specified disorders of muscle: Secondary | ICD-10-CM | POA: Diagnosis not present

## 2022-07-24 DIAGNOSIS — M62838 Other muscle spasm: Secondary | ICD-10-CM | POA: Diagnosis not present

## 2022-07-24 DIAGNOSIS — M6281 Muscle weakness (generalized): Secondary | ICD-10-CM | POA: Diagnosis not present

## 2022-07-24 DIAGNOSIS — R102 Pelvic and perineal pain: Secondary | ICD-10-CM | POA: Diagnosis not present

## 2022-07-25 ENCOUNTER — Encounter: Payer: Self-pay | Admitting: Gastroenterology

## 2022-07-27 ENCOUNTER — Other Ambulatory Visit: Payer: Self-pay | Admitting: Family Medicine

## 2022-07-27 DIAGNOSIS — R350 Frequency of micturition: Secondary | ICD-10-CM

## 2022-07-27 DIAGNOSIS — N419 Inflammatory disease of prostate, unspecified: Secondary | ICD-10-CM

## 2022-07-27 DIAGNOSIS — R339 Retention of urine, unspecified: Secondary | ICD-10-CM

## 2022-07-29 ENCOUNTER — Other Ambulatory Visit: Payer: Self-pay | Admitting: Family Medicine

## 2022-07-29 DIAGNOSIS — G43109 Migraine with aura, not intractable, without status migrainosus: Secondary | ICD-10-CM

## 2022-07-30 ENCOUNTER — Other Ambulatory Visit: Payer: Self-pay

## 2022-07-30 ENCOUNTER — Other Ambulatory Visit: Payer: Self-pay | Admitting: Family Medicine

## 2022-07-30 DIAGNOSIS — N419 Inflammatory disease of prostate, unspecified: Secondary | ICD-10-CM

## 2022-07-30 DIAGNOSIS — R350 Frequency of micturition: Secondary | ICD-10-CM

## 2022-07-30 DIAGNOSIS — R339 Retention of urine, unspecified: Secondary | ICD-10-CM

## 2022-07-30 MED ORDER — ONDANSETRON 4 MG PO TBDP: 4.0000 mg | ORAL_TABLET | Freq: Three times a day (TID) | ORAL | 3 refills | Status: DC | PRN

## 2022-07-30 NOTE — Telephone Encounter (Signed)
Patient is requesting a refill of the following medications: Requested Prescriptions   Pending Prescriptions Disp Refills   rizatriptan (MAXALT-MLT) 10 MG disintegrating tablet [Pharmacy Med Name: RIZATRIPTAN ODT '10MG'$  TABLETS] 9 tablet 0    Sig: DISSOLVE 1 TABLET ON THE TONGUE AS NEEDED FOR HEADACHE. MAY REPEAT IN 2 HOURS AS NEEDED    Date of patient request: 07/30/22 Last office visit: 05/14/22 Date of last refill: 11/03/21 discontinued in Feb? Last refill amount: 9  Follow up time period per chart: 6 months

## 2022-07-31 NOTE — Telephone Encounter (Signed)
Med still active at last visit, but sumatriptan also listed - will clarify with patient.

## 2022-08-01 DIAGNOSIS — K5902 Outlet dysfunction constipation: Secondary | ICD-10-CM | POA: Diagnosis not present

## 2022-08-01 DIAGNOSIS — J301 Allergic rhinitis due to pollen: Secondary | ICD-10-CM | POA: Diagnosis not present

## 2022-08-01 DIAGNOSIS — M6281 Muscle weakness (generalized): Secondary | ICD-10-CM | POA: Diagnosis not present

## 2022-08-01 DIAGNOSIS — M62838 Other muscle spasm: Secondary | ICD-10-CM | POA: Diagnosis not present

## 2022-08-01 DIAGNOSIS — R102 Pelvic and perineal pain: Secondary | ICD-10-CM | POA: Diagnosis not present

## 2022-08-01 DIAGNOSIS — J3089 Other allergic rhinitis: Secondary | ICD-10-CM | POA: Diagnosis not present

## 2022-08-03 NOTE — Telephone Encounter (Signed)
Pt sent back a message in Mychart noting possible coverage issues with Maxalt but that it does work the best

## 2022-08-03 NOTE — Telephone Encounter (Signed)
Noted - will try to rx maxalt, then can change if needed for better coverage.

## 2022-08-14 DIAGNOSIS — M6281 Muscle weakness (generalized): Secondary | ICD-10-CM | POA: Diagnosis not present

## 2022-08-14 DIAGNOSIS — M6289 Other specified disorders of muscle: Secondary | ICD-10-CM | POA: Diagnosis not present

## 2022-08-14 DIAGNOSIS — K5902 Outlet dysfunction constipation: Secondary | ICD-10-CM | POA: Diagnosis not present

## 2022-08-14 DIAGNOSIS — R102 Pelvic and perineal pain: Secondary | ICD-10-CM | POA: Diagnosis not present

## 2022-08-15 ENCOUNTER — Ambulatory Visit (INDEPENDENT_AMBULATORY_CARE_PROVIDER_SITE_OTHER): Payer: BC Managed Care – PPO | Admitting: Family Medicine

## 2022-08-15 ENCOUNTER — Ambulatory Visit (INDEPENDENT_AMBULATORY_CARE_PROVIDER_SITE_OTHER): Payer: BC Managed Care – PPO

## 2022-08-15 ENCOUNTER — Encounter: Payer: Self-pay | Admitting: Family Medicine

## 2022-08-15 VITALS — BP 122/80 | HR 94 | Temp 98.6°F | Ht 71.0 in | Wt 252.8 lb

## 2022-08-15 DIAGNOSIS — R062 Wheezing: Secondary | ICD-10-CM | POA: Diagnosis not present

## 2022-08-15 DIAGNOSIS — R059 Cough, unspecified: Secondary | ICD-10-CM

## 2022-08-15 DIAGNOSIS — J45901 Unspecified asthma with (acute) exacerbation: Secondary | ICD-10-CM | POA: Diagnosis not present

## 2022-08-15 DIAGNOSIS — Z20828 Contact with and (suspected) exposure to other viral communicable diseases: Secondary | ICD-10-CM | POA: Diagnosis not present

## 2022-08-15 LAB — POC COVID19 BINAXNOW: SARS Coronavirus 2 Ag: NEGATIVE

## 2022-08-15 MED ORDER — PREDNISONE 20 MG PO TABS: ORAL_TABLET | ORAL | 0 refills | Status: DC

## 2022-08-15 NOTE — Progress Notes (Signed)
Subjective:  Patient ID: Randall Reed., male    DOB: 1971/06/25  Age: 51 y.o. MRN: [id number]  CC:  Chief Complaint  Patient presents with   Asthma    Pt states he has cough nasal congested trouble breathing, headache and sore throat, pt states his nephew had RSV and he was in close contact with him, pt states he has had it since Sept 30, Ocr 2 he spoke to an allergist and they gave him Zpack and prednisone with no relief, pt has not taken a covid test     HPI Randall Reed. presents for   Cough, congestion Cough, nasal congestion, worsening asthma control, headache, sore throat.  Did have close contact with nephew with RSV - contact with him on Friday 9/29.  Symptoms present since September 30.  Cough, congestion. Spoke to allergist on October 2 and treated with prednisone taper -'40mg'$  for few days, then '20mg'$ ,  and  Z-Pak. Minimal improvement for few days. No COVID testing. Cough worse since last Saturday  - 5 days ago. Some wheeze. Feels worse each day. Worse at night. Wheezing, more cough, raspy coughing fits, no syncope.  Hx of asthma - on Dupixent every 2 weeks, Breztri BID, singulair at night. Nasal wash BID from ENT and takes allegra, ryaltris BID.  No fever.  Whitish phlegm. No hemoptysis.  Tx:No relief with tessalon '200mg'$  TID, Albuterol inhaler 5-6 times per day, and duoneb BID.   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) 06/04/2014   rotator cuff tear 10/17/2013   Hypogonadism male 09/04/2013   ED (erectile dysfunction) 07/30/2013   Palpitations 02/21/2012   BMI 33.0-33.9,adult 02/04/2012   Allergic rhinitis 02/04/2012   GERD (gastroesophageal reflux disease) 02/04/2012   Migraine 02/04/2012   GAD (generalized anxiety disorder)  02/04/2012   Insomnia 02/04/2012   Essential hypertension 04/13/2009   CHEST PAIN-UNSPECIFIED 04/13/2009   Past Medical History:  Diagnosis Date   Anxiety    Arthritis    Asthma    Depression    GERD (gastroesophageal reflux disease)    Hyperlipidemia    Phreesia 12/05/2020   Seasonal allergic reaction    Past Surgical History:  Procedure Laterality Date   LIPOMA EXCISION N/A 10/30/2018   Procedure: EXCISION OF MULTIPLE SUBCUTANEOUS LIPOMAS ON TORSO;  Surgeon: Donnie Mesa, MD;  Location: [facility];  Service: General;  Laterality: N/A;   none     none     Allergies  Allergen Reactions   Desloratadine Other (See Comments)    CLARINEX-"severe headache"   Loratadine Other (See Comments)    "severe headache"   Other    Levbid [Hyoscyamine Sulfate] Rash   Prior to Admission medications   Medication Sig Start Date End Date Taking? Authorizing Provider  albuterol (VENTOLIN HFA) 108 (90 Base) MCG/ACT inhaler INHALE 2 PUFFS INTO THE LUNGS EVERY 6 HOURS AS NEEDED FOR WHEEZING OR SHORTNESS OF BREATH 07/19/20  Yes [name], MD  [facility] ALLERGY 180 MG tablet Take 180 mg by mouth daily. 07/12/20  Yes [provider]  ALPRAZolam [name]) 1 MG tablet TAKE 1 TABLET(1 MG) BY MOUTH TWICE DAILY AS NEEDED 07/17/22  Yes [name], MD  anastrozole (ARIMIDEX) 1 MG tablet Take 1 mg by mouth daily.  Yes [provider]  aspirin EC 81 MG tablet Take 1 tablet (81 mg total) by mouth daily. 03/07/15  Yes Sherren Mocha, MD  Budeson-Glycopyrrol-Formoterol (BREZTRI AEROSPHERE) 160-9-4.8 MCG/ACT AERO  09/14/21  Yes [provider]  Diclofenac Sodium (PENNSAID) 2 % SOLN Apply topically as needed.  04/08/20  Yes [provider]  dicyclomine (BENTYL) 20 MG tablet Take 1 tablet (20 mg total) by mouth 3 (three) times daily before meals. 07/20/22 08/19/22 Yes Ladene Artist, MD  diltiazem (CARDIZEM CD) 180 MG 24 hr capsule TAKE 1 CAPSULE(180 MG) BY  MOUTH DAILY 03/15/22  Yes Sherren Mocha, MD  DUPIXENT 300 MG/2ML prefilled syringe Inject into the skin. 10/06/21  Yes [provider]  emtricitabine-tenofovir (TRUVADA) 200-300 MG tablet Take 1 tablet by mouth daily. 05/14/22  Yes [name], MD  hydrocortisone (ANUSOL-HC) 2.5 % rectal cream APPLY RECTALLY TWICE DAILY 06/30/20  Yes [name], MD  ipratropium (ATROVENT) 0.06 % nasal spray Place 2 sprays into the nose 2 (two) times daily. 05/25/21  Yes [name], MD  ipratropium-albuterol (DUONEB) 0.5-2.5 (3) MG/3ML SOLN USE 1 VIAL VIA NEBULIZER EVERY 4 TO 6 HOURS AS NEEDED FOR COUGH OR WHEEZING OR SHORTNESS OF BREATH OR CHEST TIGHTNESS 12/25/21  Yes [provider]  losartan (COZAAR) 25 MG tablet TAKE 1 TABLET(25 MG) BY MOUTH DAILY 04/25/22  Yes [name], MD  metoprolol succinate (TOPROL-XL) 25 MG 24 hr tablet TAKE 1 TABLET(25 MG) BY MOUTH AT BEDTIME 04/25/22  Yes Sherren Mocha, MD  mometasone-formoterol (DULERA) 100-5 MCG/ACT AERO Inhale 2 puffs into the lungs 2 (two) times daily.   Yes [provider]  montelukast (SINGULAIR) 10 MG tablet TAKE 1 TABLET(10 MG) BY MOUTH AT BEDTIME 04/27/22  Yes [name], MD  mupirocin ointment (BACTROBAN) 2 % Apply 1 application topically 2 (two) times daily. 11/13/21  Yes [name], MD  Olopatadine-Mometasone Rennie Plowman) [id number] MCG/ACT SUSP Place into the nose.   Yes [provider]  ondansetron (ZOFRAN-ODT) 4 MG disintegrating tablet Take 1 tablet (4 mg total) by mouth every 8 (eight) hours as needed for nausea or vomiting. 07/30/22  Yes Ladene Artist, MD  pantoprazole (PROTONIX) 40 MG tablet Take 40 mg by mouth 2 (two) times daily.  04/08/20  Yes [provider]  Potassium Chloride ER 20 MEQ TBCR TAKE 1 TABLET BY MOUTH DAILY 08/24/21  Yes Sherren Mocha, MD  rizatriptan (MAXALT-MLT) 10 MG disintegrating tablet DISSOLVE 1 TABLET ON THE TONGUE AS NEEDED FOR HEADACHE. MAY REPEAT IN  2 HOURS AS NEEDED 08/03/22  Yes [name], MD  rosuvastatin (CRESTOR) 10 MG tablet TAKE 1 TABLET(10 MG) BY MOUTH DAILY 03/15/22  Yes Sherren Mocha, MD  Spacer/Aero-Holding Chambers DEVI Use with inhaler 05/15/22  Yes Icard, Octavio Graves, DO  testosterone cypionate (DEPOTESTOSTERONE CYPIONATE) 200 MG/ML injection SMARTSIG:1 Milliliter(s) IM Every 10 Days 07/07/22  Yes [provider]  triamcinolone cream (KENALOG) 0.1 % Apply topically daily. 06/17/22  Yes [provider]  valsartan (DIOVAN) 80 MG tablet Take 1 tablet every day by oral route.   Yes [provider]  Vitamin D, Ergocalciferol, (DRISDOL) 1.25 MG (50000 UNIT) CAPS capsule Take 1 capsule (50,000 Units total) by mouth every 7 (seven) days. 06/21/22  Yes [name], MD  zolpidem (AMBIEN) 10 MG tablet TAKE 1 TABLET(10 MG) BY MOUTH AT BEDTIME 07/17/22  Yes [name], MD  acetic acid-hydrocortisone (VOSOL-HC) otic solution Place 3 drops into both ears 3 (  three) times daily. Use as needed to prevent swimmer's ear Patient not taking: Reported on 08/15/2022 01/10/15   Leandrew Koyanagi, MD  famotidine (PEPCID) 40 MG tablet Take 40 mg by mouth at bedtime.  Patient not taking: Reported on 07/17/2022 02/18/20   [provider]  fluticasone (FLONASE) 50 MCG/ACT nasal spray Place 2 sprays into both nostrils daily. Patient not taking: Reported on 07/17/2022 07/12/20   [provider]  montelukast (SINGULAIR) 10 MG tablet Take by mouth. Patient not taking: Reported on 08/15/2022 04/20/16   [provider]  ondansetron (ZOFRAN) 8 MG tablet SMARTSIG:1 Tablet(s) By Mouth As Directed PRN Patient not taking: Reported on 08/15/2022 04/24/22   [provider]  pramoxine-hydrocortisone (PROCTOCREAM-HC) 1-1 % rectal cream Place rectally 3 (three) times daily. Patient not taking: Reported on 07/17/2022 05/07/22   [name], MD  rizatriptan (MAXALT) 10 MG tablet TAKE 1 TABLET BY MOUTH AS  NEEDED FOR MIGRAINE. MAY REPEAT IN 2 HOURS IF NEEDED Patient not taking: Reported on 08/15/2022 11/03/21   [name], MD  sildenafil (VIAGRA) 100 MG tablet TAKE 1/2 TO 1 TABLET BY MOUTH ONCE DAILY AS NEEDED PRIOR TO SEXUAL ACTIVITY Patient not taking: Reported on 08/15/2022 03/08/22   Sherren Mocha, MD  tamsulosin (FLOMAX) 0.4 MG CAPS capsule TAKE 1 CAPSULE(0.4 MG) BY MOUTH DAILY AS NEEDED Patient not taking: Reported on 08/15/2022 07/27/22   [name], MD  dicyclomine (BENTYL) 20 MG tablet Take 20 mg by mouth 3 (three) times daily before meals.    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)   Occupation: [employer]  Tobacco Use   Smoking status: Never   Smokeless tobacco: Never  Vaping Use   Vaping Use: Never used  Substance and Sexual Activity   Alcohol use: Yes    Alcohol/week: 3.0 standard drinks of alcohol    Types: 3 Standard drinks or equivalent per week    Comment: 1 a month   Drug use: No   Sexual activity: Yes  Other Topics Concern   Not on file  Social History Narrative   Not on file   Social Determinants of Health   Financial Resource Strain: Not on file  Food Insecurity: Not on file  Transportation Needs: Not on file  Physical Activity: Not on file  Stress: Not on file  Social Connections: Not on file  Intimate Partner Violence: Not on file    Review of Systems Per HPI.   Objective:   Vitals:   08/15/22 0910  BP: 122/80  Pulse: 94  Temp: 98.6 F (37 C)  SpO2: 97%  Weight: 252 lb 12.8 oz (114.7 kg)  Height: '5\' 11"'$  (1.803 m)     Physical Exam Vitals reviewed.  Constitutional:      Appearance: He is well-developed.  HENT:     Head: Normocephalic and atraumatic.  Eyes:      Conjunctiva/sclera: Conjunctivae normal.     Pupils: Pupils are equal, round, and reactive to light.  Cardiovascular:     Rate and Rhythm: Normal rate and regular rhythm.     Heart sounds: Normal heart sounds. No murmur heard. Pulmonary:     Effort: Pulmonary effort is normal.  Breath sounds: Wheezing (Left greater than right lower lobes.  Speaking in full sentences without respiratory distress.  Few possible areas of rhonchi inferiorly.) and rhonchi present. No rales.  Abdominal:     Palpations: Abdomen is soft.     Tenderness: There is no abdominal tenderness.  Musculoskeletal:     Cervical back: Neck supple.  Lymphadenopathy:     Cervical: No cervical adenopathy.  Skin:    General: Skin is warm and dry.     Findings: No rash.  Neurological:     Mental Status: He is alert and oriented to person, place, and time.  Psychiatric:        Behavior: Behavior normal.    DG Chest 2 View  Result Date: 08/15/2022 CLINICAL DATA:  Cough, wheeze, worsening. EXAM: CHEST - 2 VIEW COMPARISON:  Chest x-ray August 27, 2012. FINDINGS: The heart size and mediastinal contours are within normal limits. Both lungs are clear. No visible pleural effusions or pneumothorax. No acute osseous abnormality. IMPRESSION: No active cardiopulmonary disease. Electronically Signed   By: Margaretha Sheffield M.D.   On: 08/15/2022 10:44      Results for orders placed or performed in visit on 08/15/22  POC COVID-19  Result Value Ref Range   SARS Coronavirus 2 Ag Negative Negative    Assessment & Plan:  Kaison Mcparland. is a 51 y.o. male . Cough in adult - Plan: POC COVID-19, DG Chest 2 View  Wheezing - Plan: predniSONE (DELTASONE) 20 MG tablet  Exposure to respiratory syncytial virus (RSV)  Exacerbation of asthma, unspecified asthma severity, unspecified whether persistent  Suspected viral illness with flare of asthma/reactive airway.  Did have exposure to RSV.  COVID testing negative.  Minimal  wheeze on exam, chest x-ray was reassuring.  Repeat prednisone, with taper, continue DuoNeb, albuterol as needed with ER precautions if more frequent needed or clinical worsening.  Understanding expressed.  Meds ordered this encounter  Medications   predniSONE (DELTASONE) 20 MG tablet    Sig: 3 by mouth for 3 days, then 2 by mouth for 2 days, then 1 by mouth for 2 days, then 1/2 by mouth for 2 days.    Dispense:  16 tablet    Refill:  0   Patient Instructions  I do hear some wheezing, some possible congestion in the lower lungs.  Oxygen level and vital signs look okay here today.  Unfortunately we do not have an RSV test, but if needed that can be done through the emergency room.  Please have x-ray performed at [facility] location today.  Higher dose prednisone prescribed with taper for possible asthma but if you continue to require albuterol frequently in the next 24 hours, or any sooner than 4 to 6 hours after previous treatment, be seen in the emergency room.  I am hoping that you will turn the corner soon with the prednisone.  Hang in there.  [facility] 8:30-4:30 during weekdays, no appointment needed [place].  [place], [place] [address]       Signed,   [name], MD [facility] 08/15/22 10:05 AM

## 2022-08-15 NOTE — Patient Instructions (Addendum)
I do hear some wheezing, some possible congestion in the lower lungs.  Oxygen level and vital signs look okay here today.  Unfortunately we do not have an RSV test, but if needed that can be done through the emergency room.  Please have x-ray performed at [facility] location today.  Higher dose prednisone prescribed with taper for possible asthma but if you continue to require albuterol frequently in the next 24 hours, or any sooner than 4 to 6 hours after previous treatment, be seen in the emergency room.  I am hoping that you will turn the corner soon with the prednisone.  Hang in there.  [facility] 8:30-4:30 during weekdays, no appointment needed [place].  [place], [place] [address]

## 2022-08-16 ENCOUNTER — Encounter: Payer: Self-pay | Admitting: Family Medicine

## 2022-08-20 DIAGNOSIS — J301 Allergic rhinitis due to pollen: Secondary | ICD-10-CM | POA: Diagnosis not present

## 2022-08-20 DIAGNOSIS — J3089 Other allergic rhinitis: Secondary | ICD-10-CM | POA: Diagnosis not present

## 2022-08-22 DIAGNOSIS — R102 Pelvic and perineal pain: Secondary | ICD-10-CM | POA: Diagnosis not present

## 2022-08-22 DIAGNOSIS — M62838 Other muscle spasm: Secondary | ICD-10-CM | POA: Diagnosis not present

## 2022-08-22 DIAGNOSIS — M6281 Muscle weakness (generalized): Secondary | ICD-10-CM | POA: Diagnosis not present

## 2022-08-22 DIAGNOSIS — M6289 Other specified disorders of muscle: Secondary | ICD-10-CM | POA: Diagnosis not present

## 2022-08-23 DIAGNOSIS — R051 Acute cough: Secondary | ICD-10-CM | POA: Diagnosis not present

## 2022-08-23 DIAGNOSIS — R062 Wheezing: Secondary | ICD-10-CM | POA: Diagnosis not present

## 2022-08-23 DIAGNOSIS — R0981 Nasal congestion: Secondary | ICD-10-CM | POA: Diagnosis not present

## 2022-08-23 DIAGNOSIS — J3489 Other specified disorders of nose and nasal sinuses: Secondary | ICD-10-CM | POA: Diagnosis not present

## 2022-08-27 DIAGNOSIS — J019 Acute sinusitis, unspecified: Secondary | ICD-10-CM | POA: Diagnosis not present

## 2022-08-27 DIAGNOSIS — R102 Pelvic and perineal pain: Secondary | ICD-10-CM | POA: Diagnosis not present

## 2022-08-27 DIAGNOSIS — R911 Solitary pulmonary nodule: Secondary | ICD-10-CM | POA: Diagnosis not present

## 2022-08-27 DIAGNOSIS — M6281 Muscle weakness (generalized): Secondary | ICD-10-CM | POA: Diagnosis not present

## 2022-08-27 DIAGNOSIS — M62838 Other muscle spasm: Secondary | ICD-10-CM | POA: Diagnosis not present

## 2022-08-27 DIAGNOSIS — J301 Allergic rhinitis due to pollen: Secondary | ICD-10-CM | POA: Diagnosis not present

## 2022-08-27 DIAGNOSIS — J4550 Severe persistent asthma, uncomplicated: Secondary | ICD-10-CM | POA: Diagnosis not present

## 2022-08-27 DIAGNOSIS — N3943 Post-void dribbling: Secondary | ICD-10-CM | POA: Diagnosis not present

## 2022-08-28 NOTE — Progress Notes (Unsigned)
08/28/2022 Randall Reed [id number] July 08, 1971   Chief Complaint:  History of Present Illness:  Randall Reed is a 51 year old male with a past medical history of anxiety, depression, obesity, asthma, hypertension, hyperlipidemia, nonobstructive coronary artery disease per coronary CT, paroxysmal tachycardia, GERD and diverticulitis.      Latest Ref Rng & Units 06/28/2022    9:31 AM 05/02/2022   10:14 AM 04/23/2022    5:26 PM  CBC  WBC 4.0 - 10.5 K/uL 7.7  5.0  10.8   Hemoglobin 13.0 - 17.0 g/dL 17.1  16.4  17.1   Hematocrit 39.0 - 52.0 % 50.4  48.2  50.8   Platelets 150.0 - 400.0 K/uL 204.0  173.0  184        Latest Ref Rng & Units 06/28/2022   10:40 AM 05/02/2022   10:14 AM 04/23/2022    5:26 PM  CMP  Glucose 70 - 99 mg/dL 99  91  103   BUN 6 - 23 mg/dL '11  7  9   '$ Creatinine 0.40 - 1.50 mg/dL 1.09  1.12  1.14   Sodium 135 - 145 mEq/L 138  139  139   Potassium 3.5 - 5.1 mEq/L 4.3  4.1  4.1   Chloride 96 - 112 mEq/L 102  103  105   CO2 19 - 32 mEq/L '27  30  24   '$ Calcium 8.4 - 10.5 mg/dL 9.3  9.5  9.8   Total Protein 6.0 - 8.3 g/dL 6.9  7.0  7.7   Total Bilirubin 0.2 - 1.2 mg/dL 0.7  0.6  1.0   Alkaline Phos 39 - 117 U/L 73  86  62   AST 0 - 37 U/L '16  15  17   '$ ALT 0 - 53 U/L '19  16  25      '$ PAST GI PROCEDURES:  EGD 07/17/2022: - Normal esophagus. - Erythematous mucosa in the greater curvature. Biopsied. - Normal duodenal bulb and second portion of the duodenum.  Colonoscopy 07/17/2022: - The examined portion of the ileum was normal. - A single (solitary) ulcer at the ileocecal valve. Biopsied. - Severe diverticulosis in the left colon. There was narrowing of the colon in association with the diverticular opening. There was evidence of diverticular spasm. - Stricture in the sigmoid colon associated with diverticulosis. - External hemorrhoids. - The examination was otherwise normal on direct and retroflexion views. - External hemorrhoids. - Repeat  colonoscopy in 10 years for screening purposes  Diagnosis 1. 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 Surgical [P], gastric, antrum and body - GASTRIC ANTRAL AND OXYNTIC MUCOSA WITH FEATURES OF REACTIVE GASTROPATHY - NEGATIVE FOR H. PYLORI ON H&E STAIN - NEGATIVE FOR INTESTINAL METAPLASIA OR MALIGNANCY Diagnosis Note 1. comment: Chronicity is characterized only by minimal architectural changes and patchy lamina propria lymphoplasmacytosis. Histologic features of inflammatory bowel disease are not seen. The differential diagnosis includes drug effect, infectious etiologies etc. Clinical correlation is suggested.   Colonoscopy 05/23/2018 by [name]: Diverticulosis in the sigmoid colon, in the descending colon, in the transverse colon and in the ascending colon. Two 4 to 5 mm polyps in the descending colon, removed with a cold snare.  Resected and retrieved. The entire examined colon is normal.  Biopsied.   The distal rectum and anal verge are normal on retroflexion view. Otherwise normal to  terminal ileum.   EGD 12/17/2001: Normal    EGD 11/17/199: Normal        Current Medications, Allergies, Past Medical History, Past Surgical History, Family History and Social History were reviewed in [employer] record.   Review of Systems:   Constitutional: Negative for fever, sweats, chills or weight loss.  Respiratory: Negative for shortness of breath.   Cardiovascular: Negative for chest pain, palpitations and leg swelling.  Gastrointestinal: See HPI.  Musculoskeletal: Negative for back pain or muscle aches.  Neurological: Negative for dizziness, headaches or paresthesias.    Physical Exam: There were no vitals taken for this visit. General: Well developed, w   ***male in no acute distress. Head: Normocephalic and  atraumatic. Eyes: No scleral icterus. Conjunctiva pink . Ears: Normal auditory acuity. Mouth: Dentition intact. No ulcers or lesions.  Lungs: Clear throughout to auscultation. Heart: Regular rate and rhythm, no murmur. Abdomen: Soft, nontender and nondistended. No masses or hepatomegaly. Normal bowel sounds x 4 quadrants.  Rectal: *** Musculoskeletal: Symmetrical with no gross deformities. Extremities: No edema. Neurological: Alert oriented x 4. No focal deficits.  Psychological: Alert and cooperative. Normal mood and affect  Assessment and Recommendations: ***

## 2022-08-29 ENCOUNTER — Ambulatory Visit: Payer: BC Managed Care – PPO | Admitting: Nurse Practitioner

## 2022-08-29 ENCOUNTER — Encounter: Payer: Self-pay | Admitting: Nurse Practitioner

## 2022-08-29 VITALS — BP 118/66 | HR 92 | Ht 71.0 in | Wt 258.4 lb

## 2022-08-29 DIAGNOSIS — K219 Gastro-esophageal reflux disease without esophagitis: Secondary | ICD-10-CM

## 2022-08-29 NOTE — Patient Instructions (Addendum)
Continue fiber supplement of choice once daily  Colace (docusate sodium) stool softener '100mg'$  one by mouth once daily as needed   Contact our office if your abdominal pains worsen   Follow up as needed   It was a pleasure to see you today!  Thank you for trusting me with your gastrointestinal care!

## 2022-08-29 NOTE — Progress Notes (Signed)
I called the patient and left a detailed msg on his personal voicemail regarding his question regarding a 10 yr colonoscopy recall which included Dr. Lynne Leader input as noted below.   Ladene Artist, MD  Noralyn Pick, NP This recommendation is aligned with the updated polyp surveillance guidelines - updated a couple years ago. With 1-2 small TAs on prior colonoscopy and current colonoscopy polyp free it is a 10 year recall.

## 2022-08-30 ENCOUNTER — Other Ambulatory Visit: Payer: Self-pay | Admitting: Family Medicine

## 2022-08-30 DIAGNOSIS — F411 Generalized anxiety disorder: Secondary | ICD-10-CM

## 2022-08-30 DIAGNOSIS — F5104 Psychophysiologic insomnia: Secondary | ICD-10-CM

## 2022-08-31 ENCOUNTER — Encounter: Payer: Self-pay | Admitting: Family Medicine

## 2022-08-31 ENCOUNTER — Other Ambulatory Visit: Payer: Self-pay | Admitting: Family Medicine

## 2022-08-31 ENCOUNTER — Other Ambulatory Visit: Payer: Self-pay

## 2022-08-31 DIAGNOSIS — N419 Inflammatory disease of prostate, unspecified: Secondary | ICD-10-CM

## 2022-08-31 DIAGNOSIS — R339 Retention of urine, unspecified: Secondary | ICD-10-CM

## 2022-08-31 DIAGNOSIS — F411 Generalized anxiety disorder: Secondary | ICD-10-CM

## 2022-08-31 DIAGNOSIS — R350 Frequency of micturition: Secondary | ICD-10-CM

## 2022-08-31 DIAGNOSIS — R7989 Other specified abnormal findings of blood chemistry: Secondary | ICD-10-CM

## 2022-08-31 MED ORDER — TAMSULOSIN HCL 0.4 MG PO CAPS: ORAL_CAPSULE | ORAL | 0 refills | Status: DC

## 2022-08-31 NOTE — Telephone Encounter (Signed)
I spoke to the pt and advised per Dr Carlota Raspberry he sent in Xanax but the Ambien should not need refills at this time a 90 day supply was sent in 07/06/22

## 2022-08-31 NOTE — Telephone Encounter (Signed)
Ambien 10 mg  LOV: 05/31/22 Last Refill:07/06/22 Upcoming appt: none  Xanax 1 mg Filled 07/17/22

## 2022-08-31 NOTE — Telephone Encounter (Signed)
Anxiety, insomnia discussed at his visit July 10.  Controlled substance database reviewed.  Alprazolam last filled 07/17/2022, Ambien No. [id number] filled on 07/17/2022.  Should not yet need Ambien refill, but alprazolam was refilled.

## 2022-08-31 NOTE — Telephone Encounter (Signed)
Patient is requesting a refill of the following medications: Requested Prescriptions   Pending Prescriptions Disp Refills   ALPRAZolam (XANAX) 1 MG tablet 60 tablet 0    Sig: TAKE 1 TABLET(1 MG) BY MOUTH TWICE DAILY AS NEEDED   tamsulosin (FLOMAX) 0.4 MG CAPS capsule 30 capsule 0    Sig: TAKE 1 CAPSULE(0.4 MG) BY MOUTH DAILY AS NEEDED    Date of patient request: 08/31/22 Last office visit: 08/15/22 Date of last refill: 07/27/22 Last refill amount: 60   Tamsulosin was mistakenly sent to wrong pharmacy

## 2022-08-31 NOTE — Telephone Encounter (Signed)
Flomax refilled.  Alprazolam refilled on other request.

## 2022-08-31 NOTE — Telephone Encounter (Signed)
Patient called in and we handled this via phone note

## 2022-09-03 ENCOUNTER — Other Ambulatory Visit: Payer: Self-pay | Admitting: Cardiovascular Disease

## 2022-09-03 DIAGNOSIS — K5902 Outlet dysfunction constipation: Secondary | ICD-10-CM | POA: Diagnosis not present

## 2022-09-03 DIAGNOSIS — M6281 Muscle weakness (generalized): Secondary | ICD-10-CM | POA: Diagnosis not present

## 2022-09-03 DIAGNOSIS — R102 Pelvic and perineal pain: Secondary | ICD-10-CM | POA: Diagnosis not present

## 2022-09-03 DIAGNOSIS — M62838 Other muscle spasm: Secondary | ICD-10-CM | POA: Diagnosis not present

## 2022-09-10 DIAGNOSIS — J3089 Other allergic rhinitis: Secondary | ICD-10-CM | POA: Diagnosis not present

## 2022-09-10 DIAGNOSIS — J301 Allergic rhinitis due to pollen: Secondary | ICD-10-CM | POA: Diagnosis not present

## 2022-09-12 ENCOUNTER — Other Ambulatory Visit: Payer: Self-pay | Admitting: Gastroenterology

## 2022-09-17 DIAGNOSIS — R140 Abdominal distension (gaseous): Secondary | ICD-10-CM | POA: Insufficient documentation

## 2022-09-18 DIAGNOSIS — M62838 Other muscle spasm: Secondary | ICD-10-CM | POA: Diagnosis not present

## 2022-09-18 DIAGNOSIS — K5902 Outlet dysfunction constipation: Secondary | ICD-10-CM | POA: Diagnosis not present

## 2022-09-18 DIAGNOSIS — M6289 Other specified disorders of muscle: Secondary | ICD-10-CM | POA: Diagnosis not present

## 2022-09-18 DIAGNOSIS — M6281 Muscle weakness (generalized): Secondary | ICD-10-CM | POA: Diagnosis not present

## 2022-09-21 ENCOUNTER — Telehealth: Payer: Self-pay | Admitting: *Deleted

## 2022-09-21 NOTE — Patient Outreach (Signed)
  Care Coordination   09/21/2022 Name: Randall Reed. MRN: [id number] DOB: 1971-08-21   Care Coordination Outreach Attempts:  An unsuccessful telephone outreach was attempted today to offer the patient information about available care coordination services as a benefit of their health plan.   Follow Up Plan:  Additional outreach attempts will be made to offer the patient care coordination information and services.   Encounter Outcome:  No Answer  Care Coordination Interventions Activated:  No   Care Coordination Interventions:  No, not indicated    Raina Mina, RN Care Management Coordinator [place] Office [phone]

## 2022-09-24 DIAGNOSIS — N475 Adhesions of prepuce and glans penis: Secondary | ICD-10-CM | POA: Diagnosis not present

## 2022-09-24 DIAGNOSIS — N529 Male erectile dysfunction, unspecified: Secondary | ICD-10-CM | POA: Diagnosis not present

## 2022-09-24 DIAGNOSIS — R102 Pelvic and perineal pain: Secondary | ICD-10-CM | POA: Diagnosis not present

## 2022-09-26 ENCOUNTER — Other Ambulatory Visit: Payer: Self-pay | Admitting: Family Medicine

## 2022-09-26 DIAGNOSIS — F411 Generalized anxiety disorder: Secondary | ICD-10-CM

## 2022-09-26 NOTE — Telephone Encounter (Signed)
Patient is requesting a refill of the following medications: Requested Prescriptions   Pending Prescriptions Disp Refills   ALPRAZolam (XANAX) 1 MG tablet [Pharmacy Med Name: ALPRAZOLAM '1MG'$  TABLETS] 60 tablet     Sig: TAKE 1 TABLET(1 MG) BY MOUTH TWICE DAILY AS NEEDED    Date of patient request: 09/26/2022 Last office visit: 08/15/2022 Date of last refill: 08/31/2022 Last refill amount: 60 tab

## 2022-09-26 NOTE — Telephone Encounter (Signed)
anxiety discussed at his July 10 visit.  Stable on regimen at that time.  Controlled substance database reviewed.  Alprazolam 1 mg #60 last filled 08/31/2022, refill ordered.

## 2022-10-01 DIAGNOSIS — M6289 Other specified disorders of muscle: Secondary | ICD-10-CM | POA: Diagnosis not present

## 2022-10-01 DIAGNOSIS — J301 Allergic rhinitis due to pollen: Secondary | ICD-10-CM | POA: Diagnosis not present

## 2022-10-01 DIAGNOSIS — J3089 Other allergic rhinitis: Secondary | ICD-10-CM | POA: Diagnosis not present

## 2022-10-01 DIAGNOSIS — R102 Pelvic and perineal pain: Secondary | ICD-10-CM | POA: Diagnosis not present

## 2022-10-01 DIAGNOSIS — M6281 Muscle weakness (generalized): Secondary | ICD-10-CM | POA: Diagnosis not present

## 2022-10-01 DIAGNOSIS — M62838 Other muscle spasm: Secondary | ICD-10-CM | POA: Diagnosis not present

## 2022-10-02 NOTE — Telephone Encounter (Signed)
Medication discussed at July 10 visit.  Controlled substance database reviewed.  Alprazolam last filled for #60 on 09/28/2022.  Please clarify request as this medication was recently filled.

## 2022-10-02 NOTE — Addendum Note (Signed)
Addended byPauline Good, Persis Graffius on: 10/02/2022 08:07 AM   Modules accepted: Orders

## 2022-10-02 NOTE — Telephone Encounter (Signed)
Xanax 1 mg LOV: 08/15/22 Last Refill:09/26/22 Upcoming appt: none

## 2022-10-03 ENCOUNTER — Other Ambulatory Visit: Payer: Self-pay | Admitting: Family Medicine

## 2022-10-03 DIAGNOSIS — R7989 Other specified abnormal findings of blood chemistry: Secondary | ICD-10-CM

## 2022-10-04 ENCOUNTER — Ambulatory Visit (INDEPENDENT_AMBULATORY_CARE_PROVIDER_SITE_OTHER): Payer: BC Managed Care – PPO | Admitting: Gastroenterology

## 2022-10-04 ENCOUNTER — Encounter: Payer: Self-pay | Admitting: Gastroenterology

## 2022-10-04 VITALS — BP 122/76 | HR 90 | Ht 71.0 in | Wt 256.6 lb

## 2022-10-04 DIAGNOSIS — K5900 Constipation, unspecified: Secondary | ICD-10-CM | POA: Diagnosis not present

## 2022-10-04 DIAGNOSIS — K5731 Diverticulosis of large intestine without perforation or abscess with bleeding: Secondary | ICD-10-CM

## 2022-10-04 DIAGNOSIS — R1032 Left lower quadrant pain: Secondary | ICD-10-CM

## 2022-10-04 NOTE — Progress Notes (Signed)
Assessment     Constipation and left-sided abdominal pain Severe left colon diverticulosis with an associated sigmoid stenosis GERD IC valve ulcer History of adenomatous colon polyps in 2019, no colon polyps in September 2023   Recommendations    Bowel purge with 2 bottles of magnesium citrate Increase MiraLAX to 2-3 capfuls per day titrated for a complete bowel movement daily Colorectal surgical referral if symptoms not adequately improved with laxatives Continue pantoprazole 40 mg p.o. twice daily.  Continue famotidine 40 mg at bedtime as needed.  Follow antireflux measures Colonoscopy recall September 2033 REV in 1 month   HPI    This is a 51 year old male with continued problems with constipation and left-sided abdominal pain.  He relates he took a dose of milk of magnesia over the weekend with multiple bowel movements and improvement in symptoms.  He is taking MiraLAX once daily which has not adequately addressed his constipation.  His reflux symptoms are well controlled on his current regimen.   Labs / Imaging       Latest Ref Rng & Units 06/28/2022   10:40 AM 05/02/2022   10:14 AM 04/23/2022    5:26 PM  Hepatic Function  Total Protein 6.0 - 8.3 g/dL 6.9  7.0  7.7   Albumin 3.5 - 5.2 g/dL 4.4  4.5  4.8   AST 0 - 37 U/L _0 ALT 0 - 53 U/L _1 Alk Phosphatase 39 - 117 U/L 73  86  62   Total Bilirubin 0.2 - 1.2 mg/dL 0.7  0.6  1.0        Latest Ref Rng & Units 06/28/2022    9:31 AM 05/02/2022   10:14 AM 04/23/2022    5:26 PM  CBC  WBC 4.0 - 10.5 K/uL 7.7  5.0  10.8   Hemoglobin 13.0 - 17.0 g/dL 17.1  16.4  17.1   Hematocrit 39.0 - 52.0 % 50.4  48.2  50.8   Platelets 150.0 - 400.0 K/uL 204.0  173.0  184     Current Medications, Allergies, Past Medical History, Past Surgical History, Family History and Social History were reviewed in [employer] record.   Physical Exam: General: Well developed, well nourished, no acute  distress Head: Normocephalic and atraumatic Eyes: Sclerae anicteric, EOMI Ears: Normal auditory acuity Mouth: No deformities or lesions noted Lungs: Clear throughout to auscultation Heart: Regular rate and rhythm; No murmurs, rubs or bruits Abdomen: Soft, large, mild LLQ tenderness and non distended. No masses, hepatosplenomegaly or hernias noted. Normal Bowel sounds Rectal: Not done Musculoskeletal: Symmetrical with no gross deformities  Pulses:  Normal pulses noted Extremities: No clubbing, cyanosis, edema or deformities noted Neurological: Alert oriented x 4, grossly nonfocal Psychological:  Alert and cooperative. Normal mood and affect   Felise Georgia T. Fuller Plan, MD 10/04/2022, 9:35 AM

## 2022-10-04 NOTE — Patient Instructions (Signed)
Dr Fuller Plan recommends that you complete a bowel purge (to clean out your bowels). Please do the following: Purchase 2 bottles of magnesium citrate and drink both bottles back to back to purge bowels.  Remain on a clear liquid diet the day of the bowel purge.   Increase your Miralax 2-3 x daily.   Call our office back if you do not see any improvement.   The [facility] providers would like to encourage you to use [facility] to communicate with providers for non-urgent requests or questions.  Due to long hold times on the telephone, sending your provider a message by [facility] may be a faster and more efficient way to get a response.  Please allow 48 business hours for a response.  Please remember that this is for non-urgent requests.   Thank you for choosing me and [facility].  Pricilla Riffle. Dagoberto Ligas., MD., Marval Regal

## 2022-10-05 ENCOUNTER — Encounter: Payer: Self-pay | Admitting: Gastroenterology

## 2022-10-05 HISTORY — PX: CIRCUMCISION: SUR203

## 2022-10-07 MED ORDER — VITAMIN D (ERGOCALCIFEROL) 1.25 MG (50000 UNIT) PO CAPS: 50000.0000 [IU] | ORAL_CAPSULE | ORAL | 0 refills | Status: DC

## 2022-10-07 NOTE — Addendum Note (Signed)
Addended by: [name] R on: 10/07/2022 10:24 PM   Modules accepted: Orders

## 2022-10-14 ENCOUNTER — Other Ambulatory Visit: Payer: Self-pay | Admitting: Family Medicine

## 2022-10-14 DIAGNOSIS — F5104 Psychophysiologic insomnia: Secondary | ICD-10-CM

## 2022-10-15 DIAGNOSIS — N475 Adhesions of prepuce and glans penis: Secondary | ICD-10-CM | POA: Diagnosis not present

## 2022-10-15 NOTE — Telephone Encounter (Signed)
Ambien 10 mg LOV: 05/14/22 Last Refill:07/17/22 Upcoming appt: none

## 2022-10-16 DIAGNOSIS — M6289 Other specified disorders of muscle: Secondary | ICD-10-CM | POA: Diagnosis not present

## 2022-10-16 DIAGNOSIS — M62838 Other muscle spasm: Secondary | ICD-10-CM | POA: Diagnosis not present

## 2022-10-16 DIAGNOSIS — M6281 Muscle weakness (generalized): Secondary | ICD-10-CM | POA: Diagnosis not present

## 2022-10-16 DIAGNOSIS — K5902 Outlet dysfunction constipation: Secondary | ICD-10-CM | POA: Diagnosis not present

## 2022-10-16 NOTE — Telephone Encounter (Signed)
Medication discussed July 10.  Insomnia stable at that time with Ambien.  Controlled substance database reviewed.  Ambien 10 mg #90 last filled 07/17/2022.  Refill ordered.

## 2022-10-19 DIAGNOSIS — Z125 Encounter for screening for malignant neoplasm of prostate: Secondary | ICD-10-CM | POA: Diagnosis not present

## 2022-10-19 DIAGNOSIS — E669 Obesity, unspecified: Secondary | ICD-10-CM | POA: Diagnosis not present

## 2022-10-19 DIAGNOSIS — E230 Hypopituitarism: Secondary | ICD-10-CM | POA: Diagnosis not present

## 2022-10-21 ENCOUNTER — Other Ambulatory Visit: Payer: Self-pay | Admitting: Family Medicine

## 2022-10-21 DIAGNOSIS — I10 Essential (primary) hypertension: Secondary | ICD-10-CM

## 2022-10-22 DIAGNOSIS — J3089 Other allergic rhinitis: Secondary | ICD-10-CM | POA: Diagnosis not present

## 2022-10-22 DIAGNOSIS — J301 Allergic rhinitis due to pollen: Secondary | ICD-10-CM | POA: Diagnosis not present

## 2022-10-23 DIAGNOSIS — N475 Adhesions of prepuce and glans penis: Secondary | ICD-10-CM | POA: Diagnosis not present

## 2022-10-23 DIAGNOSIS — A630 Anogenital (venereal) warts: Secondary | ICD-10-CM | POA: Diagnosis not present

## 2022-10-23 DIAGNOSIS — N478 Other disorders of prepuce: Secondary | ICD-10-CM | POA: Diagnosis not present

## 2022-10-25 ENCOUNTER — Encounter: Payer: Self-pay | Admitting: Family Medicine

## 2022-10-25 ENCOUNTER — Ambulatory Visit (INDEPENDENT_AMBULATORY_CARE_PROVIDER_SITE_OTHER): Payer: BC Managed Care – PPO | Admitting: Family Medicine

## 2022-10-25 ENCOUNTER — Other Ambulatory Visit (HOSPITAL_COMMUNITY)
Admission: RE | Admit: 2022-10-25 | Discharge: 2022-10-25 | Disposition: A | Discharge status: Home / Self Care | Payer: BC Managed Care – PPO | Source: Ambulatory Visit | Attending: Family Medicine | Admitting: Family Medicine

## 2022-10-25 VITALS — BP 124/76 | HR 70 | Temp 98.3°F | Ht 71.0 in | Wt 258.6 lb

## 2022-10-25 DIAGNOSIS — J45909 Unspecified asthma, uncomplicated: Secondary | ICD-10-CM

## 2022-10-25 DIAGNOSIS — I10 Essential (primary) hypertension: Secondary | ICD-10-CM

## 2022-10-25 DIAGNOSIS — Z79899 Other long term (current) drug therapy: Secondary | ICD-10-CM

## 2022-10-25 DIAGNOSIS — Z113 Encounter for screening for infections with a predominantly sexual mode of transmission: Secondary | ICD-10-CM | POA: Diagnosis not present

## 2022-10-25 DIAGNOSIS — R350 Frequency of micturition: Secondary | ICD-10-CM

## 2022-10-25 DIAGNOSIS — R339 Retention of urine, unspecified: Secondary | ICD-10-CM

## 2022-10-25 DIAGNOSIS — K644 Residual hemorrhoidal skin tags: Secondary | ICD-10-CM

## 2022-10-25 DIAGNOSIS — F411 Generalized anxiety disorder: Secondary | ICD-10-CM | POA: Diagnosis not present

## 2022-10-25 DIAGNOSIS — G43109 Migraine with aura, not intractable, without status migrainosus: Secondary | ICD-10-CM

## 2022-10-25 DIAGNOSIS — Z87898 Personal history of other specified conditions: Secondary | ICD-10-CM | POA: Diagnosis not present

## 2022-10-25 DIAGNOSIS — R7989 Other specified abnormal findings of blood chemistry: Secondary | ICD-10-CM | POA: Diagnosis not present

## 2022-10-25 DIAGNOSIS — R238 Other skin changes: Secondary | ICD-10-CM

## 2022-10-25 DIAGNOSIS — F5104 Psychophysiologic insomnia: Secondary | ICD-10-CM

## 2022-10-25 DIAGNOSIS — N419 Inflammatory disease of prostate, unspecified: Secondary | ICD-10-CM

## 2022-10-25 MED ORDER — ALPRAZOLAM 1 MG PO TABS: ORAL_TABLET | ORAL | 0 refills | Status: DC

## 2022-10-25 MED ORDER — RIZATRIPTAN BENZOATE 10 MG PO TBDP: ORAL_TABLET | ORAL | 2 refills | Status: DC

## 2022-10-25 MED ORDER — VITAMIN D (ERGOCALCIFEROL) 1.25 MG (50000 UNIT) PO CAPS: 50000.0000 [IU] | ORAL_CAPSULE | ORAL | 0 refills | Status: DC

## 2022-10-25 NOTE — Progress Notes (Signed)
Subjective:  Patient ID: Randall Apple., male    DOB: 1971/08/10  Age: 51 y.o. MRN: [id number]  CC:  Chief Complaint  Patient presents with   Asthma    Pt states all is well    HPI Randall Reed. presents for   Asthma with allergic rhinitis. Treat in October for flare with possible exposure to RSV at that time, treated with prednisone taper, DuoNeb, albuterol if needed.  Followed by ENT and allergist.  Currently treated with Breztri, dupixent, ryaltris has been effective.   Chronic abdominal pain, diverticulosis, followed by gastroenterology.,  MyChart message noted recently with management of constipation and dicyclomine as needed. Dr. Fuller Plan.   Vitamin D deficiency: Last vitamin D Lab Results  Component Value Date   VD25OH 44.58 06/18/2022  Vit D  50k units per week.   Hypertension: Followed by cardiology - [name].  Treated with losartan, Toprol, diltiazem, potassium.  Refilled by cardiology. Off aspirin temporarily with recent urologic procedure. On statin, also followed by cardiology.   BP Readings from Last 3 Encounters:  10/25/22 124/76  10/04/22 122/76  08/29/22 118/66   Lab Results  Component Value Date   CREATININE 1.09 06/28/2022   Endocrine History of prediabetes, but normal A1c in July.  Followed by endocrinology and urology for low testosterone. On testosterone and arimidex. Dr. Cain Sieve.  Requests A1c today.  Lab Results  Component Value Date   HGBA1C 5.2 05/14/2022   Wt Readings from Last 3 Encounters:  10/25/22 258 lb 9.6 oz (117.3 kg)  10/04/22 256 lb 9.6 oz (116.4 kg)  08/29/22 258 lb 6.4 oz (117.2 kg)   Anxiety with insomnia Generalized anxiety disorder.  Unable to tolerate multiple different SSRI trials previously.  Overalls been well-controlled with use of alprazolam and we have discussed risks of this medication and plan for continued attempts at tapering.  Takes 1 to 3/day depending on stress, Ambien in the evening and sleep for  insomnia but not combined alprazolam.  Denies parasomnias, feels well rested during the night, no side effects with this combination.  Average 2 per day of xanax per day.  Ambien nightly.  Controlled substance database (PDMP) reviewed. No concerns appreciated. Last filled alprazolam #60 on 09/28/22.    Migraine HA: Doing well. None in past  few weeks.  No current HA specialist currently.  Maxalt as needed only.    HIV preexposure prophylaxis Takes Truvada daily.  Nonreactive HIV, RPR May. 2 new sexual contacts since last testing.  No new penile d/c or rash. No med side effects.  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[facility]) 06/04/2014   rotator cuff tear 10/17/2013   Hypogonadism male 09/04/2013   ED (erectile dysfunction) 07/30/2013   Palpitations 02/21/2012   BMI 33.0-33.9,adult 02/04/2012   Allergic rhinitis 02/04/2012   GERD (gastroesophageal reflux disease) 02/04/2012   Migraine 02/04/2012   GAD (generalized anxiety disorder) 02/04/2012   Insomnia 02/04/2012   Essential hypertension 04/13/2009   CHEST PAIN-UNSPECIFIED 04/13/2009   Past Medical History:  Diagnosis Date   Anxiety    Arthritis    Asthma    Depression    GERD (gastroesophageal reflux disease)    Hyperlipidemia  Phreesia 12/05/2020   Seasonal allergic reaction    Past Surgical History:  Procedure Laterality Date   LIPOMA EXCISION N/A 10/30/2018   Procedure: EXCISION OF MULTIPLE SUBCUTANEOUS LIPOMAS ON TORSO;  Surgeon: Donnie Mesa, MD;  Location: [place];  Service: General;  Laterality: N/A;   none     none     Allergies  Allergen Reactions   Desloratadine Other (See Comments)     CLARINEX-"severe headache"   Loratadine Other (See Comments)    "severe headache"   Other    Levbid [Hyoscyamine Sulfate] Rash   Prior to Admission medications   Medication Sig Start Date End Date Taking? Authorizing Provider  albuterol (VENTOLIN HFA) 108 (90 Base) MCG/ACT inhaler INHALE 2 PUFFS INTO THE LUNGS EVERY 6 HOURS AS NEEDED FOR WHEEZING OR SHORTNESS OF BREATH 07/19/20  Yes [name], MD  [facility] ALLERGY 180 MG tablet Take 180 mg by mouth daily. 07/12/20  Yes [provider]  ALPRAZolam [name]) 1 MG tablet TAKE 1 TABLET(1 MG) BY MOUTH TWICE DAILY AS NEEDED 09/26/22  Yes [name], MD  anastrozole (ARIMIDEX) 1 MG tablet Take 1 mg by mouth daily.   Yes [provider]  Budeson-Glycopyrrol-Formoterol (BREZTRI AEROSPHERE) 160-9-4.8 MCG/ACT AERO  09/14/21  Yes [provider]  celecoxib (CELEBREX) 200 MG capsule Take 200 mg by mouth 2 (two) times daily. 10/23/22  Yes [provider]  Diclofenac Sodium (PENNSAID) 2 % SOLN Apply topically as needed.  04/08/20  Yes [provider]  diltiazem (CARDIZEM CD) 180 MG 24 hr capsule TAKE 1 CAPSULE(180 MG) BY MOUTH DAILY 03/15/22  Yes Sherren Mocha, MD  DUPIXENT 300 MG/2ML prefilled syringe Inject into the skin. 10/06/21  Yes [provider]  emtricitabine-tenofovir (TRUVADA) 200-300 MG tablet Take 1 tablet by mouth daily. 05/14/22  Yes [name], MD  famotidine (PEPCID) 40 MG tablet Take 40 mg by mouth at bedtime. 02/18/20  Yes [provider]  hydrocortisone (ANUSOL-HC) 2.5 % rectal cream APPLY RECTALLY TWICE DAILY 06/30/20  Yes [name], MD  ipratropium (ATROVENT) 0.06 % nasal spray Place 2 sprays into the nose 2 (two) times daily. 05/25/21  Yes [name], MD  ipratropium-albuterol (DUONEB) 0.5-2.5 (3) MG/3ML SOLN USE 1 VIAL VIA NEBULIZER EVERY 4 TO 6 HOURS AS NEEDED FOR COUGH OR WHEEZING OR SHORTNESS OF BREATH OR CHEST TIGHTNESS 12/25/21  Yes [provider]  losartan (COZAAR) 25 MG tablet TAKE 1 TABLET(25 MG) BY MOUTH DAILY 10/22/22  Yes [name], MD  metoprolol succinate (TOPROL-XL) 25 MG 24 hr tablet TAKE 1 TABLET(25 MG) BY MOUTH AT BEDTIME 04/25/22  Yes Sherren Mocha, MD  mometasone-formoterol (DULERA) 100-5 MCG/ACT AERO Inhale 2 puffs into the lungs 2 (two) times daily.   Yes [provider]  montelukast (SINGULAIR) 10 MG tablet TAKE 1 TABLET(10 MG) BY MOUTH AT BEDTIME 04/27/22  Yes [name], MD  mupirocin ointment (BACTROBAN) 2 % Apply 1 application topically 2 (two) times daily. 11/13/21  Yes [name], MD  Olopatadine-Mometasone Rennie Plowman) [phone] MCG/ACT SUSP Place into the nose.   Yes [provider]  ondansetron (ZOFRAN-ODT) 4 MG disintegrating tablet DISSOLVE 1 TABLET(4 MG) ON THE TONGUE EVERY 8 HOURS AS NEEDED FOR NAUSEA OR VOMITING 09/12/22  Yes Ladene Artist, MD  oxyCODONE (OXY IR/ROXICODONE) 5 MG immediate release tablet Take 5 mg by mouth every 6 (six) hours as needed. 10/23/22  Yes [provider]  pantoprazole (PROTONIX) 40 MG  tablet Take 40 mg by mouth 2 (two) times daily.  04/08/20  Yes [provider]  Potassium Chloride ER 20 MEQ TBCR TAKE 1 TABLET BY MOUTH DAILY 09/03/22  Yes Sherren Mocha, MD  pramoxine-hydrocortisone (PROCTOCREAM-HC) 1-1 % rectal cream Place rectally 3 (three) times daily. 05/07/22  Yes [name], MD  promethazine-dextromethorphan (PROMETHAZINE-DM) 6.25-15 MG/5ML syrup Take 5 mLs by mouth 3 (three) times daily as needed. 08/23/22  Yes [provider]  rizatriptan (MAXALT-MLT) 10 MG disintegrating tablet DISSOLVE 1 TABLET ON THE TONGUE AS NEEDED FOR HEADACHE. MAY REPEAT IN 2 HOURS AS NEEDED 08/03/22  Yes [name], MD  rosuvastatin (CRESTOR) 10 MG tablet TAKE 1 TABLET(10 MG) BY MOUTH DAILY 03/15/22  Yes Sherren Mocha, MD  sildenafil (VIAGRA) 100 MG tablet TAKE 1/2 TO 1 TABLET BY MOUTH ONCE DAILY AS NEEDED PRIOR TO  SEXUAL ACTIVITY 03/08/22  Yes Sherren Mocha, MD  Spacer/Aero-Holding Chambers DEVI Use with inhaler 05/15/22  Yes Icard, Octavio Graves, DO  tamsulosin (FLOMAX) 0.4 MG CAPS capsule TAKE 1 CAPSULE(0.4 MG) BY MOUTH DAILY AS NEEDED 08/31/22  Yes [name], MD  testosterone cypionate (DEPOTESTOSTERONE CYPIONATE) 200 MG/ML injection SMARTSIG:1 Milliliter(s) IM Every 10 Days 07/07/22  Yes [provider]  triamcinolone cream (KENALOG) 0.1 % Apply topically daily. 06/17/22  Yes [provider]  valsartan (DIOVAN) 80 MG tablet Take 1 tablet every day by oral route.   Yes [provider]  Vitamin D, Ergocalciferol, (DRISDOL) 1.25 MG (50000 UNIT) CAPS capsule Take 1 capsule (50,000 Units total) by mouth every 7 (seven) days. 10/07/22  Yes [name], MD  zolpidem (AMBIEN) 10 MG tablet TAKE 1 TABLET(10 MG) BY MOUTH AT BEDTIME 10/16/22  Yes [name], MD  acetic acid-hydrocortisone (VOSOL-HC) otic solution Place 3 drops into both ears 3 (three) times daily. Use as needed to prevent swimmer's ear Patient not taking: Reported on 10/25/2022 01/10/15   Leandrew Koyanagi, MD  aspirin EC 81 MG tablet Take 1 tablet (81 mg total) by mouth daily. Patient not taking: Reported on 10/25/2022 03/07/15   Sherren Mocha, MD  dicyclomine (BENTYL) 20 MG tablet Take 1 tablet (20 mg total) by mouth 3 (three) times daily before meals. Patient taking differently: Take 20 mg by mouth 3 (three) times daily before meals. Pt is taking 07/20/22 10/04/22  Ladene Artist, MD  fluticasone [facility]) 50 MCG/ACT nasal spray Place 2 sprays into both nostrils daily. Patient not taking: Reported on 10/25/2022 07/12/20   [provider]  rizatriptan (MAXALT) 10 MG tablet TAKE 1 TABLET BY MOUTH AS NEEDED FOR MIGRAINE. MAY REPEAT IN 2 HOURS IF NEEDED Patient not taking: Reported on 10/25/2022 11/03/21   [name], MD  tamsulosin (FLOMAX) 0.4 MG CAPS capsule TAKE 1 CAPSULE(0.4 MG) BY MOUTH  DAILY AS NEEDED Patient not taking: Reported on 10/25/2022 08/31/22   [name], MD  dicyclomine (BENTYL) 20 MG tablet Take 20 mg by mouth 3 (three) times daily before meals.    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 [employer]  Tobacco Use   Smoking status: Never   Smokeless tobacco: Never  Vaping Use   Vaping Use:  Never used  Substance and Sexual Activity   Alcohol use: Yes    Alcohol/week: 3.0 standard drinks of alcohol    Types: 3 Standard drinks or equivalent per week    Comment: 1 a month   Drug use: No   Sexual activity: Yes  Other Topics Concern   Not on file  Social History Narrative   Not on file   Social Determinants of Health   Financial Resource Strain: Not on file  Food Insecurity: Not on file  Transportation Needs: Not on file  Physical Activity: Not on file  Stress: Not on file  Social Connections: Not on file  Intimate Partner Violence: Not on file    Review of Systems   Objective:   Vitals:   10/25/22 1406  BP: 124/76  Pulse: 70  Temp: 98.3 F (36.8 C)  SpO2: 96%  Weight: 258 lb 9.6 oz (117.3 kg)  Height: '5\' 11"'$  (1.803 m)     Physical Exam Vitals reviewed.  Constitutional:      Appearance: He is well-developed.  HENT:     Head: Normocephalic and atraumatic.  Neck:     Vascular: No carotid bruit or JVD.  Cardiovascular:     Rate and Rhythm: Normal rate and regular rhythm.     Heart sounds: Normal heart sounds. No murmur heard. Pulmonary:     Effort: Pulmonary effort is normal.     Breath sounds: Normal breath sounds. No rales.  Musculoskeletal:     Right lower leg: No edema.     Left lower leg: No edema.  Skin:    General: Skin is warm and  dry.  Neurological:     Mental Status: He is alert and oriented to person, place, and time.  Psychiatric:        Mood and Affect: Mood normal.        Assessment & Plan:  Randall Reed. is a 51 y.o. male . History of prediabetes - Plan: Hemoglobin A1c  -Previously stable, check updated V6H  Uncomplicated asthma, unspecified asthma severity, unspecified whether persistent  -Stable on current regimen, no med changes, has allergist.  GAD (generalized anxiety disorder) - Plan: ALPRAZolam (XANAX) 1 MG tablet  -Treated with current regimen, continue same dose alprazolam, prior meds attempted as above.  No new side effects, potential risks of benzodiazepines discussed.  On pre-exposure prophylaxis for HIV - Plan: HIV Antibody (routine testing w rflx), Urine cytology ancillary only, RPR  -Check STI screening, continue Truvada.  External hemorrhoids  -Has meds for treatment if needed with GI follow-up if needed.  Essential hypertension  -Stable, continue same regimen.  Low vitamin D level - Plan: Vitamin D, Ergocalciferol, (DRISDOL) 1.25 MG (50000 UNIT) CAPS capsule, Vitamin D (25 hydroxy)  -Continue vitamin D supplementation, updated labs ordered.  Psychophysiological insomnia  -Stable with Ambien, denies any side effects or parasomnias, additive risk with other meds discussed as above.  No med changes for now.  Migraine with aura and without status migrainosus, not intractable - Plan: rizatriptan (MAXALT-MLT) 10 MG disintegrating tablet  -Stable, Maxalt if needed.  Consider headache specialist if increased flares.  Routine screening for STI (sexually transmitted infection) - Plan: HIV Antibody (routine testing w rflx), Urine cytology ancillary only, RPR  -Continue Truvada for HIV prophylaxis as above, safer sex practices, check labs.  Meds ordered this encounter  Medications   Vitamin D, Ergocalciferol, (DRISDOL) 1.25 MG (50000 UNIT) CAPS capsule    Sig: Take 1 capsule  (50,000 Units  total) by mouth every 7 (seven) days.    Dispense:  15 capsule    Refill:  0   rizatriptan (MAXALT-MLT) 10 MG disintegrating tablet    Sig: DISSOLVE 1 TABLET ON THE TONGUE AS NEEDED FOR HEADACHE. MAY REPEAT IN 2 HOURS AS NEEDED    Dispense:  9 tablet    Refill:  2   ALPRAZolam (XANAX) 1 MG tablet    Sig: TAKE 1 TABLET(1 MG) BY MOUTH TWICE DAILY AS NEEDED    Dispense:  60 tablet    Refill:  0   Patient Instructions  No medication changes today, keep follow-up with specialists as planned including for monitoring of cholesterol and blood pressure with cardiology, I did not check those labs today.  If any concerns on labs from here I will let you know.  Follow-up in 6 months for routine medical problems but let me know if there are questions sooner.  Take care!    Signed,   Randall Ray, MD [place] 10/25/22 2:38 PM

## 2022-10-25 NOTE — Patient Instructions (Signed)
No medication changes today, keep follow-up with specialists as planned including for monitoring of cholesterol and blood pressure with cardiology, I did not check those labs today.  If any concerns on labs from here I will let you know.  Follow-up in 6 months for routine medical problems but let me know if there are questions sooner.  Take care!

## 2022-10-26 DIAGNOSIS — E230 Hypopituitarism: Secondary | ICD-10-CM | POA: Diagnosis not present

## 2022-10-26 LAB — HEMOGLOBIN A1C: Hgb A1c MFr Bld: 5.2 % (ref 4.6–6.5)

## 2022-10-26 LAB — RPR: RPR Ser Ql: NONREACTIVE

## 2022-10-26 LAB — HIV ANTIBODY (ROUTINE TESTING W REFLEX): HIV 1&2 Ab, 4th Generation: NONREACTIVE

## 2022-10-27 ENCOUNTER — Encounter: Payer: Self-pay | Admitting: Family Medicine

## 2022-10-27 LAB — VITAMIN D 25 HYDROXY (VIT D DEFICIENCY, FRACTURES): VITD: 43.26 ng/mL (ref 30.00–100.00)

## 2022-10-30 LAB — URINE CYTOLOGY ANCILLARY ONLY
Chlamydia: NEGATIVE
Comment: NEGATIVE
Comment: NEGATIVE
Comment: NORMAL
Neisseria Gonorrhea: NEGATIVE
Trichomonas: NEGATIVE

## 2022-10-31 DIAGNOSIS — M6289 Other specified disorders of muscle: Secondary | ICD-10-CM | POA: Diagnosis not present

## 2022-10-31 DIAGNOSIS — K5902 Outlet dysfunction constipation: Secondary | ICD-10-CM | POA: Diagnosis not present

## 2022-10-31 DIAGNOSIS — M62838 Other muscle spasm: Secondary | ICD-10-CM | POA: Diagnosis not present

## 2022-10-31 DIAGNOSIS — M6281 Muscle weakness (generalized): Secondary | ICD-10-CM | POA: Diagnosis not present

## 2022-11-02 DIAGNOSIS — N475 Adhesions of prepuce and glans penis: Secondary | ICD-10-CM | POA: Diagnosis not present

## 2022-11-08 DIAGNOSIS — J301 Allergic rhinitis due to pollen: Secondary | ICD-10-CM | POA: Diagnosis not present

## 2022-11-08 DIAGNOSIS — J3089 Other allergic rhinitis: Secondary | ICD-10-CM | POA: Diagnosis not present

## 2022-11-12 DIAGNOSIS — K5902 Outlet dysfunction constipation: Secondary | ICD-10-CM | POA: Diagnosis not present

## 2022-11-12 DIAGNOSIS — R102 Pelvic and perineal pain: Secondary | ICD-10-CM | POA: Diagnosis not present

## 2022-11-12 DIAGNOSIS — J301 Allergic rhinitis due to pollen: Secondary | ICD-10-CM | POA: Diagnosis not present

## 2022-11-12 DIAGNOSIS — M6289 Other specified disorders of muscle: Secondary | ICD-10-CM | POA: Diagnosis not present

## 2022-11-12 DIAGNOSIS — J3089 Other allergic rhinitis: Secondary | ICD-10-CM | POA: Diagnosis not present

## 2022-11-14 ENCOUNTER — Telehealth: Payer: Self-pay

## 2022-11-14 ENCOUNTER — Other Ambulatory Visit (HOSPITAL_COMMUNITY): Payer: Self-pay

## 2022-11-14 NOTE — Telephone Encounter (Addendum)
Pharmacy Patient Advocate Encounter   Received notification from [employer] that prior authorization for Rizatriptan Benzoate 10MG  dispersible tablets is required/requested.  Per Test Claim: PRODUCT OR SERVICE NOT COVERED.   PA submitted on 11/15/22 to (ins) [employer] via CoverMyMeds Key [id number] Status is pending

## 2022-11-15 ENCOUNTER — Other Ambulatory Visit: Payer: Self-pay | Admitting: Family Medicine

## 2022-11-15 ENCOUNTER — Other Ambulatory Visit (HOSPITAL_COMMUNITY): Payer: Self-pay

## 2022-11-15 DIAGNOSIS — Z79899 Other long term (current) drug therapy: Secondary | ICD-10-CM

## 2022-11-21 DIAGNOSIS — M6289 Other specified disorders of muscle: Secondary | ICD-10-CM | POA: Diagnosis not present

## 2022-11-21 DIAGNOSIS — K5902 Outlet dysfunction constipation: Secondary | ICD-10-CM | POA: Diagnosis not present

## 2022-11-21 DIAGNOSIS — M6281 Muscle weakness (generalized): Secondary | ICD-10-CM | POA: Diagnosis not present

## 2022-11-21 DIAGNOSIS — M62838 Other muscle spasm: Secondary | ICD-10-CM | POA: Diagnosis not present

## 2022-11-22 DIAGNOSIS — N475 Adhesions of prepuce and glans penis: Secondary | ICD-10-CM | POA: Diagnosis not present

## 2022-11-22 DIAGNOSIS — E291 Testicular hypofunction: Secondary | ICD-10-CM | POA: Diagnosis not present

## 2022-11-26 DIAGNOSIS — M6289 Other specified disorders of muscle: Secondary | ICD-10-CM | POA: Diagnosis not present

## 2022-11-26 DIAGNOSIS — M6281 Muscle weakness (generalized): Secondary | ICD-10-CM | POA: Diagnosis not present

## 2022-11-26 DIAGNOSIS — R102 Pelvic and perineal pain: Secondary | ICD-10-CM | POA: Diagnosis not present

## 2022-11-26 DIAGNOSIS — M62838 Other muscle spasm: Secondary | ICD-10-CM | POA: Diagnosis not present

## 2022-12-01 ENCOUNTER — Other Ambulatory Visit: Payer: Self-pay | Admitting: Family Medicine

## 2022-12-01 DIAGNOSIS — F411 Generalized anxiety disorder: Secondary | ICD-10-CM

## 2022-12-03 DIAGNOSIS — J3089 Other allergic rhinitis: Secondary | ICD-10-CM | POA: Diagnosis not present

## 2022-12-03 DIAGNOSIS — J301 Allergic rhinitis due to pollen: Secondary | ICD-10-CM | POA: Diagnosis not present

## 2022-12-03 NOTE — Telephone Encounter (Signed)
Xanax 1 mg  LOV: 10/25/22 Last Refill:10/25/22 Upcoming appt: none

## 2022-12-03 NOTE — Telephone Encounter (Signed)
Medication discussed in December, average of 2/day.  Controlled substance database reviewed.  Alprazolam No. 60 last filled 10/25/2022, refill ordered.

## 2022-12-05 DIAGNOSIS — M6289 Other specified disorders of muscle: Secondary | ICD-10-CM | POA: Diagnosis not present

## 2022-12-05 DIAGNOSIS — M6281 Muscle weakness (generalized): Secondary | ICD-10-CM | POA: Diagnosis not present

## 2022-12-05 DIAGNOSIS — R102 Pelvic and perineal pain: Secondary | ICD-10-CM | POA: Diagnosis not present

## 2022-12-05 DIAGNOSIS — M62838 Other muscle spasm: Secondary | ICD-10-CM | POA: Diagnosis not present

## 2022-12-14 ENCOUNTER — Other Ambulatory Visit (HOSPITAL_COMMUNITY): Payer: Self-pay

## 2022-12-14 NOTE — Telephone Encounter (Signed)
Pharmacy Patient Advocate Encounter   Received notification that prior authorization for Rizatriptan Benzoate 10MG dispersible tablets is required/requested.   PA submitted on 12/14/22 to (ins) [place] via CoverMyMeds Key [id number] Status is pending

## 2022-12-14 NOTE — Telephone Encounter (Signed)
PA cancelled. Resubmitted with new ID number

## 2022-12-14 NOTE — Telephone Encounter (Signed)
Pharmacy Patient Advocate Encounter  PA request cancelled. PA was previously denied on 11/02/22.

## 2022-12-19 DIAGNOSIS — R102 Pelvic and perineal pain: Secondary | ICD-10-CM | POA: Diagnosis not present

## 2022-12-19 DIAGNOSIS — M6281 Muscle weakness (generalized): Secondary | ICD-10-CM | POA: Diagnosis not present

## 2022-12-19 DIAGNOSIS — M6289 Other specified disorders of muscle: Secondary | ICD-10-CM | POA: Diagnosis not present

## 2022-12-19 DIAGNOSIS — M62838 Other muscle spasm: Secondary | ICD-10-CM | POA: Diagnosis not present

## 2022-12-24 DIAGNOSIS — J3089 Other allergic rhinitis: Secondary | ICD-10-CM | POA: Diagnosis not present

## 2022-12-24 DIAGNOSIS — J301 Allergic rhinitis due to pollen: Secondary | ICD-10-CM | POA: Diagnosis not present

## 2022-12-30 ENCOUNTER — Other Ambulatory Visit: Payer: Self-pay | Admitting: Family Medicine

## 2022-12-30 DIAGNOSIS — F411 Generalized anxiety disorder: Secondary | ICD-10-CM

## 2022-12-31 NOTE — Telephone Encounter (Signed)
Xanax 1 mg LOV: 10/25/22 Last Refill:12/03/22 Upcoming appt: 04/24/23

## 2022-12-31 NOTE — Telephone Encounter (Signed)
Medication discussed at his 10/25/2022 visit.  Alprazolam last filled 12/04/2022, previously 10/25/2022.  Refill ordered.

## 2023-01-01 DIAGNOSIS — R102 Pelvic and perineal pain: Secondary | ICD-10-CM | POA: Diagnosis not present

## 2023-01-01 DIAGNOSIS — M6281 Muscle weakness (generalized): Secondary | ICD-10-CM | POA: Diagnosis not present

## 2023-01-01 DIAGNOSIS — M62838 Other muscle spasm: Secondary | ICD-10-CM | POA: Diagnosis not present

## 2023-01-01 DIAGNOSIS — M6289 Other specified disorders of muscle: Secondary | ICD-10-CM | POA: Diagnosis not present

## 2023-01-03 DIAGNOSIS — N475 Adhesions of prepuce and glans penis: Secondary | ICD-10-CM | POA: Diagnosis not present

## 2023-01-14 ENCOUNTER — Other Ambulatory Visit: Payer: Self-pay | Admitting: Cardiovascular Disease

## 2023-01-14 DIAGNOSIS — J3089 Other allergic rhinitis: Secondary | ICD-10-CM | POA: Diagnosis not present

## 2023-01-14 DIAGNOSIS — J301 Allergic rhinitis due to pollen: Secondary | ICD-10-CM | POA: Diagnosis not present

## 2023-01-15 ENCOUNTER — Other Ambulatory Visit: Payer: Self-pay | Admitting: Family Medicine

## 2023-01-15 DIAGNOSIS — J208 Acute bronchitis due to other specified organisms: Secondary | ICD-10-CM | POA: Diagnosis not present

## 2023-01-15 DIAGNOSIS — F5104 Psychophysiologic insomnia: Secondary | ICD-10-CM

## 2023-01-15 DIAGNOSIS — R059 Cough, unspecified: Secondary | ICD-10-CM | POA: Diagnosis not present

## 2023-01-15 DIAGNOSIS — B9689 Other specified bacterial agents as the cause of diseases classified elsewhere: Secondary | ICD-10-CM | POA: Diagnosis not present

## 2023-01-15 DIAGNOSIS — R062 Wheezing: Secondary | ICD-10-CM | POA: Diagnosis not present

## 2023-01-15 DIAGNOSIS — M791 Myalgia, unspecified site: Secondary | ICD-10-CM | POA: Diagnosis not present

## 2023-01-15 NOTE — Telephone Encounter (Signed)
Ambien 10 mg LOV: 10/25/22 Last Refill:10/26/22 Upcoming appt: 04/24/23

## 2023-01-15 NOTE — Telephone Encounter (Signed)
Controlled substance database reviewed.  Ambien last filled for #90 on 10/16/2022.  Refill ordered as medication was discussed at his December 21 visit.

## 2023-01-17 DIAGNOSIS — J45909 Unspecified asthma, uncomplicated: Secondary | ICD-10-CM | POA: Diagnosis not present

## 2023-01-18 ENCOUNTER — Other Ambulatory Visit: Payer: Self-pay | Admitting: Cardiovascular Disease

## 2023-01-19 ENCOUNTER — Other Ambulatory Visit: Payer: Self-pay | Admitting: Cardiovascular Disease

## 2023-01-23 ENCOUNTER — Other Ambulatory Visit: Payer: Self-pay | Admitting: Pulmonary Disease

## 2023-01-23 DIAGNOSIS — J4550 Severe persistent asthma, uncomplicated: Secondary | ICD-10-CM

## 2023-01-24 ENCOUNTER — Other Ambulatory Visit: Payer: Self-pay | Admitting: Cardiovascular Disease

## 2023-01-26 ENCOUNTER — Other Ambulatory Visit: Payer: Self-pay | Admitting: Family Medicine

## 2023-01-26 DIAGNOSIS — J309 Allergic rhinitis, unspecified: Secondary | ICD-10-CM

## 2023-01-26 DIAGNOSIS — J45909 Unspecified asthma, uncomplicated: Secondary | ICD-10-CM

## 2023-01-26 DIAGNOSIS — I10 Essential (primary) hypertension: Secondary | ICD-10-CM

## 2023-02-04 ENCOUNTER — Other Ambulatory Visit: Payer: Self-pay | Admitting: Family Medicine

## 2023-02-04 DIAGNOSIS — F411 Generalized anxiety disorder: Secondary | ICD-10-CM

## 2023-02-04 DIAGNOSIS — J3089 Other allergic rhinitis: Secondary | ICD-10-CM | POA: Diagnosis not present

## 2023-02-04 DIAGNOSIS — J301 Allergic rhinitis due to pollen: Secondary | ICD-10-CM | POA: Diagnosis not present

## 2023-02-04 NOTE — Telephone Encounter (Signed)
Xanax 1 mg LOV: 10/06/22 Last Refill:12/31/22 Upcoming appt: 04/24/23

## 2023-02-05 NOTE — Telephone Encounter (Signed)
Controlled substance database reviewed.  Alprazolam 1 mg last filled 12/31/2022, #60.  Medications discussed in December.  Refill ordered.

## 2023-02-06 DIAGNOSIS — K5902 Outlet dysfunction constipation: Secondary | ICD-10-CM | POA: Diagnosis not present

## 2023-02-06 DIAGNOSIS — M6281 Muscle weakness (generalized): Secondary | ICD-10-CM | POA: Diagnosis not present

## 2023-02-06 DIAGNOSIS — M62838 Other muscle spasm: Secondary | ICD-10-CM | POA: Diagnosis not present

## 2023-02-06 DIAGNOSIS — M6289 Other specified disorders of muscle: Secondary | ICD-10-CM | POA: Diagnosis not present

## 2023-02-13 ENCOUNTER — Telehealth: Payer: Self-pay | Admitting: Gastroenterology

## 2023-02-13 MED ORDER — DICYCLOMINE HCL 20 MG PO TABS: 20.0000 mg | ORAL_TABLET | Freq: Three times a day (TID) | ORAL | 2 refills | Status: DC

## 2023-02-13 MED ORDER — AMOXICILLIN-POT CLAVULANATE 875-125 MG PO TABS: 1.0000 | ORAL_TABLET | Freq: Two times a day (BID) | ORAL | 0 refills | Status: AC

## 2023-02-13 NOTE — Telephone Encounter (Signed)
Dr Russella Dar see the message from the pt My Chart complaints. Do you want to prescribe antibiotics?  ED visit? He had a CT in June of 2023 and EGD Colon 07/2022.  Last office visit was in November of 2023. Please advise

## 2023-02-13 NOTE — Addendum Note (Signed)
Addended by: Loretha Stapler on: 02/13/2023 04:12 PM   Modules accepted: Orders

## 2023-02-13 NOTE — Telephone Encounter (Signed)
Inbound call from patient stating he is having a diverticulitis flare up. Symptoms has been causing patient to have to leave work . Offered next available f/u with provider but requesting to speak with a nurse instead.  Can be reached on cell between 10 and 11 or after 11:30.   Can leave a detailed message or via MyChart.   Please advise.  Thank you

## 2023-02-13 NOTE — Telephone Encounter (Signed)
I have sent a message to the pt via My Chart per request.

## 2023-02-19 ENCOUNTER — Other Ambulatory Visit: Payer: Self-pay | Admitting: Cardiovascular Disease

## 2023-02-20 DIAGNOSIS — K5902 Outlet dysfunction constipation: Secondary | ICD-10-CM | POA: Diagnosis not present

## 2023-02-20 DIAGNOSIS — M6289 Other specified disorders of muscle: Secondary | ICD-10-CM | POA: Diagnosis not present

## 2023-02-20 DIAGNOSIS — M6281 Muscle weakness (generalized): Secondary | ICD-10-CM | POA: Diagnosis not present

## 2023-02-20 DIAGNOSIS — M62838 Other muscle spasm: Secondary | ICD-10-CM | POA: Diagnosis not present

## 2023-02-21 DIAGNOSIS — J301 Allergic rhinitis due to pollen: Secondary | ICD-10-CM | POA: Diagnosis not present

## 2023-02-21 DIAGNOSIS — J3089 Other allergic rhinitis: Secondary | ICD-10-CM | POA: Diagnosis not present

## 2023-02-22 ENCOUNTER — Other Ambulatory Visit: Payer: Self-pay | Admitting: Cardiovascular Disease

## 2023-03-13 DIAGNOSIS — K5902 Outlet dysfunction constipation: Secondary | ICD-10-CM | POA: Diagnosis not present

## 2023-03-13 DIAGNOSIS — M6281 Muscle weakness (generalized): Secondary | ICD-10-CM | POA: Diagnosis not present

## 2023-03-13 DIAGNOSIS — M6289 Other specified disorders of muscle: Secondary | ICD-10-CM | POA: Diagnosis not present

## 2023-03-13 DIAGNOSIS — M62838 Other muscle spasm: Secondary | ICD-10-CM | POA: Diagnosis not present

## 2023-03-14 DIAGNOSIS — J301 Allergic rhinitis due to pollen: Secondary | ICD-10-CM | POA: Diagnosis not present

## 2023-03-14 DIAGNOSIS — J3089 Other allergic rhinitis: Secondary | ICD-10-CM | POA: Diagnosis not present

## 2023-03-18 ENCOUNTER — Other Ambulatory Visit: Payer: Self-pay | Admitting: Cardiovascular Disease

## 2023-04-02 ENCOUNTER — Other Ambulatory Visit: Payer: Self-pay | Admitting: Family Medicine

## 2023-04-02 ENCOUNTER — Other Ambulatory Visit: Payer: Self-pay

## 2023-04-02 DIAGNOSIS — F411 Generalized anxiety disorder: Secondary | ICD-10-CM

## 2023-04-02 MED ORDER — SILDENAFIL CITRATE 100 MG PO TABS: ORAL_TABLET | ORAL | 3 refills | Status: AC

## 2023-04-03 NOTE — Telephone Encounter (Signed)
Medication discussed at his December 21 office visit.  Controlled substance database reviewed.  Alprazolam No. 60 last filled 03/05/2023.  Refill ordered

## 2023-04-03 NOTE — Telephone Encounter (Signed)
Requesting: alprazolam Contract: n/a UDS: n/a Last Visit: 10/25/22 Next Visit: 04/24/23 Last Refill: 02/05/23 (60,1)  Please Advise. Med pending

## 2023-04-04 DIAGNOSIS — J3089 Other allergic rhinitis: Secondary | ICD-10-CM | POA: Diagnosis not present

## 2023-04-04 DIAGNOSIS — J301 Allergic rhinitis due to pollen: Secondary | ICD-10-CM | POA: Diagnosis not present

## 2023-04-08 ENCOUNTER — Ambulatory Visit: Payer: BC Managed Care – PPO | Attending: Cardiovascular Disease | Admitting: Cardiovascular Disease

## 2023-04-08 ENCOUNTER — Telehealth: Payer: Self-pay | Admitting: Gastroenterology

## 2023-04-08 ENCOUNTER — Encounter: Payer: Self-pay | Admitting: Cardiovascular Disease

## 2023-04-08 VITALS — BP 120/72 | HR 76 | Ht 71.0 in | Wt 263.4 lb

## 2023-04-08 DIAGNOSIS — I25118 Atherosclerotic heart disease of native coronary artery with other forms of angina pectoris: Secondary | ICD-10-CM

## 2023-04-08 DIAGNOSIS — I10 Essential (primary) hypertension: Secondary | ICD-10-CM | POA: Diagnosis not present

## 2023-04-08 DIAGNOSIS — R1032 Left lower quadrant pain: Secondary | ICD-10-CM

## 2023-04-08 DIAGNOSIS — E782 Mixed hyperlipidemia: Secondary | ICD-10-CM

## 2023-04-08 DIAGNOSIS — K5730 Diverticulosis of large intestine without perforation or abscess without bleeding: Secondary | ICD-10-CM

## 2023-04-08 MED ORDER — METOPROLOL SUCCINATE ER 25 MG PO TB24
25.0000 mg | ORAL_TABLET | Freq: Every day | ORAL | 3 refills | Status: DC
Start: 2023-04-08 — End: 2024-03-13

## 2023-04-08 MED ORDER — ROSUVASTATIN CALCIUM 10 MG PO TABS
10.0000 mg | ORAL_TABLET | Freq: Every day | ORAL | 3 refills | Status: DC
Start: 2023-04-08 — End: 2024-04-01

## 2023-04-08 MED ORDER — POTASSIUM CHLORIDE ER 20 MEQ PO TBCR
1.0000 | EXTENDED_RELEASE_TABLET | Freq: Every day | ORAL | 3 refills | Status: DC
Start: 2023-04-08 — End: 2024-06-01

## 2023-04-08 MED ORDER — LOSARTAN POTASSIUM 25 MG PO TABS
ORAL_TABLET | ORAL | 3 refills | Status: DC
Start: 2023-04-08 — End: 2024-03-13

## 2023-04-08 MED ORDER — DILTIAZEM HCL ER COATED BEADS 180 MG PO CP24
ORAL_CAPSULE | ORAL | 3 refills | Status: DC
Start: 2023-04-08 — End: 2024-03-18

## 2023-04-08 NOTE — Patient Instructions (Addendum)
Medication Instructions:  Your physician recommends that you continue on your current medications as directed. Please refer to the Current Medication list given to you today.  *If you need a refill on your cardiac medications before your next appointment, please call your pharmacy*  Lab Work: CMET, Lipids today If you have labs (blood work) drawn today and your tests are completely normal, you will receive your results only by: MyChart Message (if you have MyChart) OR A paper copy in the mail If you have any lab test that is abnormal or we need to change your treatment, we will call you to review the results.  Testing/Procedures: Coronary CT Angiogram Your physician has requested that you have cardiac CT. Cardiac computed tomography (CT) is a painless test that uses an x-ray machine to take clear, detailed pictures of your heart. For further information please visit [email or web address] Please follow instruction sheet as given.  Follow-Up: At [facility], you and your health needs are our priority.  As part of our continuing mission to provide you with exceptional heart care, we have created designated Provider Care Teams.  These Care Teams include your primary Cardiologist (physician) and Advanced Practice Providers (APPs -  Physician Assistants and Nurse Practitioners) who all work together to provide you with the care you need, when you need it.  Your next appointment:   1 year(s)  Provider:   Tonny Bollman, MD       Other Instructions   Your cardiac CT will be scheduled at:   [facility] [address] [place], [place] [address] [phone]  Please arrive at the [facility] (Entrance C2) of [facility] 30 minutes prior to test start time. You can use the FREE valet parking offered at entrance C (encouraged to control the heart rate for the test)  Proceed to the [facility] Radiology Department (first floor) to check-in and  test prep.  All radiology patients and guests should use entrance C2 at [facility], accessed from [facility] even though the hospital's physical address listed is [address].     Please follow these instructions carefully (unless otherwise directed):  Hold all erectile dysfunction medications at least 3 days (72 hrs) prior to test. (Ie viagra, cialis, sildenafil, tadalafil, etc) We will administer nitroglycerin during this exam.   On the Night Before the Test: Be sure to Drink plenty of water. Do not consume any caffeinated/decaffeinated beverages or chocolate 12 hours prior to your test. Do not take any antihistamines 12 hours prior to your test.  On the Day of the Test: Drink plenty of water until 1 hour prior to the test. Do not eat any food 1 hour prior to test. You may take your regular medications prior to the test.  Take metoprolol two hours prior to test. After the Test: Drink plenty of water. After receiving IV contrast, you may experience a mild flushed feeling. This is normal. On occasion, you may experience a mild rash up to 24 hours after the test. This is not dangerous. If this occurs, you can take Benadryl 25 mg and increase your fluid intake. If you experience trouble breathing, this can be serious. If it is severe call 911 IMMEDIATELY. If it is mild, please call our office. If you take any of these medications: Glipizide/Metformin, Avandament, Glucavance, please do not take 48 hours after completing test unless otherwise instructed.  We will call to schedule your test 2-4 weeks out understanding  that some insurance companies will need an authorization prior to the service being performed.   For non-scheduling related questions, please contact the cardiac imaging nurse navigator should you have any questions/concerns: Rockwell [place], Cardiac Imaging Nurse Navigator Larey Brick, Cardiac Imaging Nurse Navigator [facility] Direct Office Dial: [phone]   For scheduling needs, including cancellations and rescheduling, please call [place], [phone].

## 2023-04-08 NOTE — Telephone Encounter (Signed)
Inbound call from patient requesting a call back regarding diverticulitis flare up. Wishing to speak about options that would help since the next available appointment is in August. Please advise, thank you.

## 2023-04-08 NOTE — Progress Notes (Signed)
Cardiology Office Note:   Date:  04/08/2023   ID:  Randall Leber., DOB Feb 13, 1971, MRN [id number]  PCP:  Shade Flood, MD   [facility] Providers Cardiologist:  Tonny Bollman, MD     Referring MD: Shade Flood, MD   Chief Complaint  Patient presents with   Hypertension    History of Present Illness:    Randall Metrick. is a 51 y.o. male with a hx of obesity, hypertension, nonobstructive coronary artery disease found on gated coronary CT scanning, and tachycardia, presenting for follow-up evaluation.   The patient has relocated from [place] to [place].  He has been working Psychologist, clinical now for about 2 years.  He has occasional chest discomfort that feels like a pressure-like sensation across the upper chest.  This has not been consistently related to exercise.  He does exercise on a spinning bike and has no symptoms with that.  He has fatigue and mild shortness of breath with exertion.  No orthopnea, PND, or leg edema.  No recent heart palpitations.  He is compliant with his medications.  He takes his diltiazem and losartan in the morning and his metoprolol succinate at bedtime.  He feels like this routine has helped his heart rate significantly.  Past Medical History:  Diagnosis Date   Anxiety    Arthritis    Asthma    Depression    GERD (gastroesophageal reflux disease)    Hyperlipidemia    Phreesia 12/05/2020   Seasonal allergic reaction     Past Surgical History:  Procedure Laterality Date   LIPOMA EXCISION N/A 10/30/2018   Procedure: EXCISION OF MULTIPLE SUBCUTANEOUS LIPOMAS ON TORSO;  Surgeon: Manus Rudd, MD;  Location: [facility];  Service: General;  Laterality: N/A;   none     none      Current Medications: Current Meds  Medication Sig   AIRSUPRA 90-80 MCG/ACT AERO SMARTSIG:2 Puff(s) By Mouth Every 4-6 Hours PRN   albuterol (VENTOLIN HFA) 108 (90 Base) MCG/ACT inhaler INHALE 2 PUFFS INTO THE LUNGS EVERY 6 HOURS AS  NEEDED FOR WHEEZING OR SHORTNESS OF BREATH   ALLEGRA ALLERGY 180 MG tablet Take 180 mg by mouth daily.   ALPRAZolam (XANAX) 1 MG tablet TAKE 1 TABLET BY MOUTH TWICE DAILY AS NEEDED.   anastrozole (ARIMIDEX) 1 MG tablet Take 1 mg by mouth daily.   aspirin EC 81 MG tablet Take 1 tablet (81 mg total) by mouth daily.   benzonatate (TESSALON) 100 MG capsule Take 100-200 mg by mouth as needed.   Budeson-Glycopyrrol-Formoterol (BREZTRI AEROSPHERE) 160-9-4.8 MCG/ACT AERO    celecoxib (CELEBREX) 200 MG capsule Take 200 mg by mouth 2 (two) times daily.   Diclofenac Sodium (PENNSAID) 2 % SOLN Apply topically as needed.    dicyclomine (BENTYL) 20 MG tablet Take 1 tablet (20 mg total) by mouth 3 (three) times daily before meals.   DUPIXENT 300 MG/2ML prefilled syringe Inject into the skin.   emtricitabine-tenofovir (TRUVADA) 200-300 MG tablet TAKE 1 TABLET BY MOUTH EVERY DAY   esomeprazole (NEXIUM) 40 MG capsule Take by mouth.   famotidine (PEPCID) 40 MG tablet Take 40 mg by mouth at bedtime.   hydrocortisone (ANUSOL-HC) 2.5 % rectal cream APPLY RECTALLY TWICE DAILY   ipratropium (ATROVENT) 0.06 % nasal spray Place 2 sprays into the nose 2 (two) times daily.   ipratropium-albuterol (DUONEB) 0.5-2.5 (3) MG/3ML SOLN USE 1 VIAL VIA NEBULIZER EVERY 4 TO 6 HOURS AS NEEDED FOR COUGH OR  WHEEZING OR SHORTNESS OF BREATH OR CHEST TIGHTNESS   mometasone-formoterol (DULERA) 100-5 MCG/ACT AERO Inhale 2 puffs into the lungs 2 (two) times daily.   montelukast (SINGULAIR) 10 MG tablet TAKE 1 TABLET(10 MG) BY MOUTH AT BEDTIME   mupirocin ointment (BACTROBAN) 2 % Apply 1 application topically 2 (two) times daily.   Olopatadine-Mometasone ([name]) [id number] MCG/ACT SUSP Place into the nose.   ondansetron (ZOFRAN-ODT) 4 MG disintegrating tablet DISSOLVE 1 TABLET(4 MG) ON THE TONGUE EVERY 8 HOURS AS NEEDED FOR NAUSEA OR VOMITING   oxyCODONE (OXY IR/ROXICODONE) 5 MG immediate release tablet Take 5 mg by mouth every 6 (six) hours  as needed.   pantoprazole (PROTONIX) 40 MG tablet Take 40 mg by mouth 2 (two) times daily.    pramoxine-hydrocortisone (PROCTOCREAM-HC) 1-1 % rectal cream Place rectally 3 (three) times daily.   promethazine-dextromethorphan (PROMETHAZINE-DM) 6.25-15 MG/5ML syrup Take 5 mLs by mouth 3 (three) times daily as needed.   rizatriptan (MAXALT-MLT) 10 MG disintegrating tablet DISSOLVE 1 TABLET ON THE TONGUE AS NEEDED FOR HEADACHE. MAY REPEAT IN 2 HOURS AS NEEDED   sildenafil (VIAGRA) 100 MG tablet Take 1/2-1 tablet by mouth once daily as needed prior to sexual activity *MUST KEEP UPCOMING APPT*   Spacer/Aero-Holding Chambers DEVI Use with inhaler   tadalafil (CIALIS) 5 MG tablet Take 5 mg by mouth daily.   tamsulosin (FLOMAX) 0.4 MG CAPS capsule TAKE 1 CAPSULE(0.4 MG) BY MOUTH DAILY AS NEEDED   testosterone cypionate (DEPOTESTOSTERONE CYPIONATE) 200 MG/ML injection SMARTSIG:1 Milliliter(s) IM Every 10 Days   triamcinolone cream (KENALOG) 0.1 % Apply topically daily.   Vitamin D, Ergocalciferol, (DRISDOL) 1.25 MG (50000 UNIT) CAPS capsule Take 1 capsule (50,000 Units total) by mouth every 7 (seven) days.   zolpidem (AMBIEN) 10 MG tablet TAKE 1 TABLET(10 MG) BY MOUTH AT BEDTIME   [DISCONTINUED] diltiazem (CARDIZEM CD) 180 MG 24 hr capsule TAKE 1 CAPSULE(180 MG) BY MOUTH DAILY   [DISCONTINUED] losartan (COZAAR) 25 MG tablet TAKE 1 TABLET(25 MG) BY MOUTH DAILY   [DISCONTINUED] metoprolol succinate (TOPROL-XL) 25 MG 24 hr tablet Take 1 tablet (25 mg total) by mouth daily. Pt needs appt for any future refills. Please call office to schedule appt.   [DISCONTINUED] Potassium Chloride ER 20 MEQ TBCR TAKE 1 TABLET BY MOUTH DAILY   [DISCONTINUED] rosuvastatin (CRESTOR) 10 MG tablet Take 1 tablet (10 mg total) by mouth daily. Please call [phone] to schedule an overdue appointment for future refills. Thank you. 2nd attempt.     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)   Occupation: [employer]  Tobacco Use   Smoking status: Never   Smokeless tobacco: Never  Vaping Use   Vaping Use: Never used  Substance and Sexual Activity   Alcohol use: Yes    Alcohol/week: 3.0 standard drinks of alcohol    Types: 3 Standard drinks or equivalent per week    Comment: 1 a month   Drug use: No   Sexual activity: Yes  Other Topics Concern   Not on file  Social History Narrative   Not on file   Social Determinants of Health   Financial Resource Strain: Not on file  Food Insecurity: Not on file  Transportation Needs: Not on file  Physical Activity: Not on file  Stress: Not on file  Social Connections: Not on file     Family History: The patient's family history includes Breast cancer in an other family member; Cancer in an other family member; Coronary artery disease in his paternal grandfather; Coronary artery disease (age of onset: 31) in his father; Heart attack in his father, maternal grandfather, and another family member; Hyperlipidemia in an other family member; Hypertension in an other family member. There is no history of Stomach cancer, Esophageal cancer, or Colon cancer.  ROS:   Please see the history of present illness.    All other systems reviewed and are negative.  EKGs/Labs/Other Studies Reviewed:    The following studies were reviewed today: Cardiac Studies & Procedures     STRESS TESTS  ECHOCARDIOGRAM STRESS TEST 11/06/2016  Narrative *[facility]* [address] [place], [place] [address] [phone]  ------------------------------------------------------------------- Stress Echocardiography  Patient:    Randall Reed, Randall Reed MR #:        [id number] Study Date: 10/30/2016 Gender:     M Age:        39 Height:     180.3 cm Weight:     112.3 kg BSA:        2.41 m^2 Pt. Status: Room:  ATTENDING    Tonny Bollman, MD ORDERING     Tonny Bollman, MD REFERRING    Tonny Bollman, MD SONOGRAPHER  Aida Raider, RDCS PERFORMING   [facility], Outpatient  cc:  -------------------------------------------------------------------  ------------------------------------------------------------------- Indications:      R07.9 Chest Pain.  ------------------------------------------------------------------- History:   PMH:  Anxiety.  Risk factors:  Obese.  Medications:  No other medications.  ------------------------------------------------------------------- Study Conclusions  - Baseline ECG: Normal sinus rhythm. - Stress ECG conclusions: Sinus tachycardia without ischemic EKG changes. - Baseline: LVEF 55-60%, normal wall motion and thickening. - Peak stress: Expected hyperdynamic increase in LVEF to 75-80%, with normal wall motion and thickening. - Recovery: LVEF 60%, normal wall motion and thickening.  Impressions:  - Electrically and echocardiographically normal stress echocardiogram without evidence for ischemia at given workload. Excellent exercise tolerance without chest pain.  ------------------------------------------------------------------- Study data:   Study status:  Routine.  Consent:  The risks, benefits, and alternatives to the procedure were explained to the patient and informed consent was obtained.  Procedure:  The patient reported no pain pre or post test. Initial setup. The patient was brought to the laboratory. A baseline ECG was recorded. Surface ECG leads and automatic cuff blood pressure measurements were monitored. Treadmill exercise testing was performed using the Bruce protocol. The patient exercised for 10 min 50 sec, to protocol stage 4, to a maximal work rate of 12.9 mets. Exercise  was terminated due to achievement of target heart rate, patient request, dyspnea, and fatigue. The patient was positioned for image acquisition and recovery monitoring. Transthoracic stress echocardiography for chest pain evaluation. Image quality was excellent. Images were captured at baseline and peak exercise. Study completion:  The patient tolerated the procedure well. There were no complications.          Bruce protocol. Stress echocardiography.  Birthdate:  Patient birthdate: Nov 25, 1970.  Age: Patient is 52 yr old.  Sex:  Gender: male.    BMI: 34.5 kg/m^2. Blood pressure:     124/86  Patient status:  Outpatient.  Study date:  Study date: 10/30/2016. Study time: 02:50 PM.  -------------------------------------------------------------------  ------------------------------------------------------------------- Baseline ECG:  Normal sinus rhythm.  ------------------------------------------------------------------- Stress protocol:  +---------------------+---+------------+----------------+ !Stage                !HR !BP (mmHg)   !Symptoms        ! +---------------------+---+------------+----------------+ !Baseline             !102!124/86 (99) !None            ! +---------------------+---+------------+----------------+ !Stage 1              !161!096/04 (99) !None            ! +---------------------+---+------------+----------------+ !Stage 2              !136!154/76 (102)!None            ! +---------------------+---+------------+----------------+ !Stage 3              !155!168/76 (107)!None            ! +---------------------+---+------------+----------------+ !Stage 4              !171!------------!Dyspnea, fatigue! +---------------------+---+------------+----------------+ !Immediate post stress!173!154/67 (96) !Subsiding       ! +---------------------+---+------------+----------------+ !Recovery; 1 min      !148!------------!None             ! +---------------------+---+------------+----------------+ !Recovery; 2 min      !133!137/58 (84) !None            ! +---------------------+---+------------+----------------+ !Recovery; 3 min      !125!------------!None            ! +---------------------+---+------------+----------------+ !Recovery; 4 min      !123!103/57 (72) !None            ! +---------------------+---+------------+----------------+ !Recovery; 5 min      !120!94/56 (69)  !None            ! +---------------------+---+------------+----------------+ !Late recovery        !540!981/19 (81) !None            ! +---------------------+---+------------+----------------+  ------------------------------------------------------------------- Stress results:   Maximal heart rate during stress was 173 bpm (100% of maximal predicted heart rate). The maximal predicted heart rate was 173 bpm.The target heart rate was achieved. The heart rate response to stress was normal. There was a normal resting blood pressure with an appropriate response to stress. The rate-pressure product for the peak heart rate and blood pressure was 14782 mm Hg/min.  ------------------------------------------------------------------- Stress ECG:  Sinus tachycardia without ischemic EKG changes.  ------------------------------------------------------------------- Baseline:  Peak stress: Recovery:  ------------------------------------------------------------------- Prepared and Electronically Authenticated by  Zoila Shutter MD 2017-12-26T17:14:36   ECHOCARDIOGRAM  ECHOCARDIOGRAM COMPLETE 02/07/2022  Narrative ECHOCARDIOGRAM REPORT    Patient Name:   Randall Reed. Date of Exam: 02/07/2022 Medical Rec #:  [id number]           Height:       71.0 in Accession #:    [id number]          Weight:       260.8 lb Date of Birth:  [birth year]            BSA:          2.360 m Patient Age:    50 years            BP:           120/82 mmHg Patient Gender: M                    HR:           89  bpm. Exam Location:  [address]  Procedure: 2D Echo, Cardiac Doppler, Color Doppler and Strain Analysis  Indications:    R55 Syncope  History:        Patient has prior history of Echocardiogram examinations, most recent 10/30/2016. CAD, Signs/Symptoms:Chest Pain; Risk Factors:Hypertension and Dyslipidemia. Palpitations. Asthma.  Sonographer:    Cathie Beams RCS Referring Phys: Tonny Bollman  IMPRESSIONS   1. Left ventricular ejection fraction, by estimation, is 60 to 65%. The left ventricle has normal function. The left ventricle has no regional wall motion abnormalities. Left ventricular diastolic parameters were normal. The average left ventricular global longitudinal strain is -18.8 %. The global longitudinal strain is normal. 2. Right ventricular systolic function is normal. The right ventricular size is normal. 3. Left atrial size was mildly dilated. 4. The mitral valve is abnormal. No evidence of mitral valve regurgitation. No evidence of mitral stenosis. 5. The aortic valve is tricuspid. Aortic valve regurgitation is not visualized. No aortic stenosis is present. 6. The inferior vena cava is normal in size with greater than 50% respiratory variability, suggesting right atrial pressure of 3 mmHg.  FINDINGS Left Ventricle: Left ventricular ejection fraction, by estimation, is 60 to 65%. The left ventricle has normal function. The left ventricle has no regional wall motion abnormalities. The average left ventricular global longitudinal strain is -18.8 %. The global longitudinal strain is normal. The left ventricular internal cavity size was normal in size. There is no left ventricular hypertrophy. Left ventricular diastolic parameters were normal.  Right Ventricle: The right ventricular size is normal. No increase in right ventricular wall thickness. Right ventricular systolic function is normal.  Left Atrium: Left atrial size was mildly  dilated.  Right Atrium: Right atrial size was normal in size.  Pericardium: There is no evidence of pericardial effusion.  Mitral Valve: The mitral valve is abnormal. There is mild thickening of the mitral valve leaflet(s). There is mild calcification of the mitral valve leaflet(s). Mild mitral annular calcification. No evidence of mitral valve regurgitation. No evidence of mitral valve stenosis.  Tricuspid Valve: The tricuspid valve is normal in structure. Tricuspid valve regurgitation is trivial. No evidence of tricuspid stenosis.  Aortic Valve: The aortic valve is tricuspid. Aortic valve regurgitation is not visualized. No aortic stenosis is present.  Pulmonic Valve: The pulmonic valve was normal in structure. Pulmonic valve regurgitation is not visualized. No evidence of pulmonic stenosis.  Aorta: The aortic root is normal in size and structure.  Venous: The inferior vena cava is normal in size with greater than 50% respiratory variability, suggesting right atrial pressure of 3 mmHg.  IAS/Shunts: No atrial level shunt detected by color flow Doppler.   LEFT VENTRICLE PLAX 2D LVIDd:         4.30 cm   Diastology LVIDs:         2.80 cm   LV e' medial:    8.38 cm/s LV PW:         1.30 cm   LV E/e' medial:  9.5 LV IVS:        0.80 cm   LV e' lateral:   10.40 cm/s LVOT diam:     2.25 cm   LV E/e' lateral: 7.7 LV SV:         80 LV SV Index:   34        2D Longitudinal Strain LVOT Area:     3.98 cm  2D Strain GLS Avg:     -18.8 %   RIGHT  VENTRICLE RV Basal diam:  2.00 cm RV S prime:     10.40 cm/s TAPSE (M-mode): 1.9 cm  LEFT ATRIUM             Index        RIGHT ATRIUM          Index LA diam:        4.00 cm 1.69 cm/m   RA Area:     8.61 cm LA Vol (A2C):   25.3 ml 10.72 ml/m  RA Volume:   13.20 ml 5.59 ml/m LA Vol (A4C):   41.2 ml 17.45 ml/m LA Biplane Vol: 35.4 ml 15.00 ml/m AORTIC VALVE LVOT Vmax:   102.00 cm/s LVOT Vmean:  63.300 cm/s LVOT VTI:    0.200  m  AORTA Ao Root diam: 3.40 cm Ao Asc diam:  3.50 cm  MITRAL VALVE MV Area (PHT): 3.10 cm    SHUNTS MV Decel Time: 245 msec    Systemic VTI:  0.20 m MV E velocity: 79.70 cm/s  Systemic Diam: 2.25 cm MV A velocity: 92.20 cm/s MV E/A ratio:  0.86  Charlton Haws MD Electronically signed by Charlton Haws MD Signature Date/Time: 02/07/2022/4:08:11 PM    Final    MONITORS  LONG TERM MONITOR (3-14 DAYS) 02/17/2022  Narrative Patch Wear Time:  3 days and 1 hours (2023-03-31T22:24:43-0400 to 2023-04-03T23:29:07-399)  Patient had a min HR of 51 bpm, max HR of 158 bpm, and avg HR of 91 bpm. Predominant underlying rhythm was Sinus Rhythm. Isolated SVEs were occasional (1.0%, 4078), SVE Couplets were rare (<1.0%, 4), and SVE Triplets were rare (<1.0%, 3). Isolated VEs were rare (<1.0%), VE Couplets were rare (<1.0%), and no VE Triplets were present.  SUMMARY: The basic rhythm is normal sinus with an average HR of 91 bpm. There are rare supraventricular beats occurring with a burden of 1%. There is no afib, sustained arrhythmia, or bradycardic events.   CT SCANS  CT CORONARY MORPH W/CTA COR W/SCORE 04/24/2018  Addendum 04/24/2018  3:38 PM ADDENDUM REPORT: 04/24/2018 15:35  CLINICAL DATA:  52 year old male with h/o obesity, DM, strong family h/o premature CAD now presenting with worsening DOE. Prior coronary CTA with non-obstructive CAD.  EXAM: Cardiac/Coronary  CT  TECHNIQUE: The patient was scanned on a [employer].  FINDINGS: A 120 kV prospective scan was triggered in the descending thoracic aorta at 111 HU's. Axial non-contrast 3 mm slices were carried out through the heart. The data set was analyzed on a dedicated work station and scored using the Agatson method. Gantry rotation speed was 250 msecs and collimation was .6 mm. No beta blockade and 0.8 mg of sl NTG was given. The 3D data set was reconstructed in 5% intervals of the 67-82 % of the R-R cycle.  Diastolic phases were analyzed on a dedicated work station using MPR, MIP and VRT modes. The patient received 80 cc of contrast.  Aorta:  Normal size.  No calcifications.  No dissection.  Aortic Valve:  Trileaflet.  No calcifications.  Coronary Arteries:  Normal coronary origin.  Right dominance.  RCA is a large dominant artery that gives rise to PDA and PLVB. There is minimal non-calcified plaque.  Left main is a large artery that gives rise to LAD, a very small ramus intermedius and LCX arteries. Left main has no plaque.  LAD is a medium caliber vessel that gives rise to one diagonal artery. Proximal LAD has a mild mixed plaque wt the takeoff of the first  diagonal artery with associated stenosis 25-50%.  D1 has no significant plaque.  RI is a very small artery that has no plaque.  LCX is a non-dominant artery that gives rise to one large OM1 branch. There is no plaque.  Other findings:  Normal pulmonary vein drainage into the left atrium.  Normal let atrial appendage without a thrombus.  Normal size of the pulmonary artery.  IMPRESSION: 1. Coronary calcium score of 7. This was 70 percentile for age and sex matched control.  2. Normal coronary origin with right dominance.  3. 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 Aggressive risk factor modification is recommended.   Electronically Signed By: Tobias Alexander On: 04/24/2018 15:35  Narrative EXAM: OVER-READ INTERPRETATION  CT CHEST  The following report is an over-read performed by radiologist Dr. Charlett Nose of [facility] on 04/24/2018. This over-read does not include interpretation of cardiac or coronary anatomy or pathology. The coronary CTA interpretation by the cardiologist is attached.  COMPARISON:  09/02/2012  FINDINGS: Vascular: Heart is normal size.  Visualized aorta is normal caliber.  Mediastinum/Nodes: No adenopathy in the lower mediastinum or  hila.  Lungs/Pleura: Dependent atelectasis in the lower lobes. No effusions.  Upper Abdomen: 2.3 cm low-density area within the left hepatic lobe is stable since prior study and likely reflects cyst.  Musculoskeletal: Chest wall soft tissues are unremarkable. 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 No acute bony abnormality.  IMPRESSION: No acute extra cardiac abnormality.  Electronically Signed: By: Charlett Nose M.D. On: 04/24/2018 09:46   CT SCANS  CT CORONARY MORPH W/CTA COR W/SCORE 02/15/2012  Narrative *RADIOLOGY REPORT*  INDICATION:  52 year old male with history of chest pain during exercise.  CT ANGIOGRAPHY OF THE HEART, CORONARY ARTERY, STRUCTURE, AND MORPHOLOGY  CONTRAST: 85 mL OMNIPAQUE IOHEXOL 350 MG/ML SOLN  COMPARISON:  None  TECHNIQUE:  CT angiography of the coronary vessels was performed on a 256 channel system using prospective ECG gating.  A scout and noncontrast exam (for calcium scoring) were performed.  Circulation time was measured using a test bolus.  Coronary CTA was performed with sub mm slice collimation during portions of the cardiac cycle after prior injection of iodinated contrast.  Imaging post processing was performed on an independent workstation creating multiplanar and 3-D images, and quantitative analysis of the heart and coronary arteries.  Note that this exam targets the heart and the chest was not imaged in its entirety.  COMMENT: During the initial attempt to acquire images, the scanner malfunctioned and failed to appropriately trigger.  Because of this, the patient was administered a second bolus of 85 ml of Omnipaque 350.  The patient was counseled to stay well hydrated for the remainder of the day, and the nurse was instructed to administer a 1 liter normal saline bolus following the examination.  PREMEDICATION: Lopressor 100 mg, P.O. Lopressor 10 mg, IV Nitroglycerin 400 mcg,  sublingual.  FINDINGS: Technical quality:  Excellent  Heart rate:  60 - 65  CORONARY ARTERIES: Left main coronary artery:  Negative Left anterior descending:  Negative for atherosclerosis.  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 Left circumflex:  Negative Right coronary artery:  Negative Posterior descending artery:  Negative Dominance:  Right  CORONARY CALCIUM:  Total Agatston Score:  0  AORTA AND PULMONARY MEASUREMENTS: Aortic root (21 - 40 mm): 27 mm  at the annulus 36 mm  at the sinuses of Valsalva 27 mm  at the sinotubular junction Ascending aorta ( <  40 mm):  33 mm Descending aorta ( <  40 mm):  23 mm Main pulmonary artery:  ( <  30 mm):  29 mm  EXTRACARDIAC FINDINGS: 1.6 x 2.0 cm low attenuation lesion in segment 2 of the liver is favored to represent a small cyst. No consolidative airspace disease or pleural effusions in the visualized portions of the thorax. There are no aggressive appearing lytic or blastic lesions noted in the visualized portions of the skeleton.  IMPRESSION: 1. No evidence of significant coronary artery disease.  Patient's coronary artery calcium score is zero. 2.  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  A short portion of the mid LAD extends into an intracavitary position within the right ventricle. This finding is benign, and typically of no clinical significance.  One caveat is if the patient should ever require pacemaker lead placement within the right ventricle, the position of the  marked intracavitary portion of the mid LAD should be noted. 3.  No acute findings in the visualized thorax to account for the patient's symptoms. 4.  Right coronary artery dominance.  Report was called to CDU mid level at [phone] at 10:30 a.m. on 02/15/2012.  Original Report Authenticated By: Florencia Reasons, M.D.           EKG:  EKG is ordered today.  The ekg ordered today demonstrates NSR 76 bpm, premature supraventricular complexes, otherwise normal  Recent Labs: 06/28/2022: ALT 19; BUN 11; Creatinine, Ser 1.09; Hemoglobin 17.1; Platelets 204.0; Potassium 4.3; Sodium 138  Recent Lipid Panel    Component Value Date/Time   CHOL 98 (L) 10/14/2020 1143   TRIG 69 10/14/2020 1143   HDL 36 (L) 10/14/2020 1143   CHOLHDL 2.7 10/14/2020 1143   CHOLHDL 5.0 11/18/2015 1657   VLDL 35 (H) 11/18/2015 1657   LDLCALC 47 10/14/2020 1143     Risk Assessment/Calculations:                Physical Exam:    VS:  BP 120/72   Pulse 76   Ht 5\' 11"  (1.803 m)   Wt 263 lb 6.4 oz (119.5 kg)   SpO2 96%   BMI 36.74 kg/m     Wt Readings from Last 3 Encounters:  04/08/23 263 lb 6.4 oz (119.5 kg)  10/25/22 258 lb 9.6 oz (117.3 kg)  10/04/22 256 lb 9.6 oz (116.4 kg)     GEN:  Well nourished, well developed in no acute distress HEENT: Normal NECK: No JVD; No carotid bruits LYMPHATICS: No lymphadenopathy CARDIAC: RRR, no murmurs, rubs, gallops RESPIRATORY:  Clear to auscultation without rales, wheezing or rhonchi  ABDOMEN: Soft, non-tender, non-distended MUSCULOSKELETAL:  No edema; No deformity  SKIN: Warm and dry NEUROLOGIC:  Alert and oriented x 3 PSYCHIATRIC:  Normal affect   ASSESSMENT:    1. Mixed hyperlipidemia   2. Essential hypertension   3. 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  Check lipids  and LFTs.   Last lipids on file are from 2021 and show a cholesterol of 98, LDL 47. Blood pressure is controlled on the combination of losartan, diltiazem, and metoprolol succinate.  Will check labs and continue his current medical program. The patient has a history of nonobstructive CAD based on gated coronary CTA from 2019.  He reports occasional upper chest pressure.  I have recommended an updated CTA of the coronaries to reevaluate for obstructive CAD.  The patient has multiple risk factors and a very strong family history of sudden cardiac death.  He is treated with aspirin, beta-blocker, and a statin drug.  He will continue to work on lifestyle modification.      Medication Adjustments/Labs and Tests Ordered: Current medicines are reviewed at length with the patient today.  Concerns regarding medicines are outlined above.  Orders Placed This Encounter  Procedures   CT CORONARY MORPH W/CTA COR W/SCORE W/CA W/CM &/OR WO/CM   Comprehensive metabolic panel   Lipid panel   EKG 12-Lead   Meds ordered this encounter  Medications   diltiazem (CARDIZEM CD) 180 MG 24 hr capsule    Sig: TAKE 1 CAPSULE(180 MG) BY MOUTH DAILY    Dispense:  90 capsule    Refill:  3   losartan (COZAAR) 25 MG tablet    Sig: TAKE 1 TABLET(25 MG) BY MOUTH DAILY    Dispense:  90 tablet    Refill:  3   metoprolol succinate (TOPROL-XL) 25 MG 24 hr tablet    Sig: Take 1 tablet (25 mg total) by mouth daily.    Dispense:  90 tablet    Refill:  3   Potassium Chloride ER 20 MEQ TBCR    Sig: Take 1 tablet (20 mEq total) by mouth daily.    Dispense:  90 tablet    Refill:  3   rosuvastatin (CRESTOR) 10 MG tablet    Sig: Take 1 tablet (10 mg total) by mouth daily.    Dispense:  90 tablet    Refill:  3    Patient Instructions  Medication Instructions:  Your physician recommends that you continue on your current medications as directed. Please refer to the Current Medication list given to you today.  *If you need a refill on  your cardiac medications before your next appointment, please call your pharmacy*  Lab Work: CMET, Lipids today If you have labs (blood work) drawn today and your tests are completely normal, you will receive your results only by: MyChart Message (if you have MyChart) OR A paper copy in the mail If you have any lab test that is abnormal or we need to change your treatment, we will call you to review the results.  Testing/Procedures: Coronary CT Angiogram Your physician has requested that you have cardiac CT. Cardiac computed tomography (CT) is a painless test that uses an x-ray machine to take clear, detailed pictures of your heart. For further information please visit [email or web address] Please follow instruction sheet as given.  Follow-Up: At [facility], you and your health needs are our priority.  As part of our continuing mission to provide you with exceptional heart care, we have created designated Provider Care Teams.  These Care Teams include your primary Cardiologist (physician) and Advanced Practice Providers (APPs -  Physician Assistants and Nurse Practitioners) who all work together to provide you with the care you need, when you need it.  Your next appointment:   1 year(s)  Provider:   Tonny Bollman, MD  Other Instructions   Your cardiac CT will be scheduled at:   [facility] [address] [place], [place] [address] [phone]  Please arrive at the [facility] (Entrance C2) of [facility] 30 minutes prior to test start time. You can use the FREE valet parking offered at entrance C (encouraged to control the heart rate for the test)  Proceed to the [facility] Radiology Department (first floor) to check-in and test prep.  All radiology patients and guests should use entrance C2 at [facility], accessed from [facility], even though the hospital's physical address listed is [address]     Please follow these instructions carefully (unless otherwise directed):  Hold all erectile dysfunction medications at least 3 days (72 hrs) prior to test. (Ie viagra, cialis, sildenafil, tadalafil, etc) We will administer nitroglycerin during this exam.   On the Night Before the Test: Be sure to Drink plenty of water. Do not consume any caffeinated/decaffeinated beverages or chocolate 12 hours prior to your test. Do not take any antihistamines 12 hours prior to your test.  On the Day of the Test: Drink plenty of water until 1 hour prior to the test. Do not eat any food 1 hour prior to test. You may take your regular medications prior to the test.  Take metoprolol two hours prior to test. After the Test: Drink plenty of water. After receiving IV contrast, you may experience a mild flushed feeling. This is normal. On occasion, you may experience a mild rash up to 24 hours after the test. This is not dangerous. If this occurs, you can take Benadryl 25 mg and increase your fluid intake. If you experience trouble breathing, this can be serious. If it is severe call 911 IMMEDIATELY. If it is mild, please call our office. If you take any of these medications: Glipizide/Metformin, Avandament, Glucavance, please do not take 48 hours after completing test unless otherwise instructed.  We will call to schedule your test 2-4 weeks out understanding that some insurance companies will need an authorization prior to the service being performed.   For non-scheduling related questions, please contact the cardiac imaging nurse navigator should you have any questions/concerns: Rockwell Alexandria, Cardiac Imaging Nurse Navigator Larey Brick, Cardiac Imaging Nurse Navigator [facility] Direct Office Dial: [phone]   For scheduling needs, including cancellations and rescheduling, please call [place], [phone].    Signed, Tonny Bollman, MD  04/08/2023 4:08  PM    [facility]

## 2023-04-09 ENCOUNTER — Encounter: Payer: Self-pay | Admitting: Gastroenterology

## 2023-04-09 ENCOUNTER — Ambulatory Visit: Payer: BC Managed Care – PPO | Attending: Cardiovascular Disease

## 2023-04-09 DIAGNOSIS — I10 Essential (primary) hypertension: Secondary | ICD-10-CM

## 2023-04-09 DIAGNOSIS — I25118 Atherosclerotic heart disease of native coronary artery with other forms of angina pectoris: Secondary | ICD-10-CM | POA: Diagnosis not present

## 2023-04-09 DIAGNOSIS — E782 Mixed hyperlipidemia: Secondary | ICD-10-CM | POA: Diagnosis not present

## 2023-04-09 LAB — COMPREHENSIVE METABOLIC PANEL
ALT: 20 IU/L (ref 0–44)
AST: 17 IU/L (ref 0–40)
Albumin/Globulin Ratio: 1.9 (ref 1.2–2.2)
Albumin: 4.6 g/dL (ref 3.8–4.9)
Alkaline Phosphatase: 110 IU/L (ref 44–121)
BUN/Creatinine Ratio: 9 (ref 9–20)
BUN: 11 mg/dL (ref 6–24)
Bilirubin Total: 0.5 mg/dL (ref 0.0–1.2)
CO2: 21 mmol/L (ref 20–29)
Calcium: 9.5 mg/dL (ref 8.7–10.2)
Chloride: 103 mmol/L (ref 96–106)
Creatinine, Ser: 1.21 mg/dL (ref 0.76–1.27)
Globulin, Total: 2.4 g/dL (ref 1.5–4.5)
Glucose: 89 mg/dL (ref 70–99)
Potassium: 4.4 mmol/L (ref 3.5–5.2)
Sodium: 143 mmol/L (ref 134–144)
Total Protein: 7 g/dL (ref 6.0–8.5)
eGFR: 71 mL/min/{1.73_m2} (ref >59)

## 2023-04-09 LAB — LIPID PANEL
Chol/HDL Ratio: 3 ratio (ref 0.0–5.0)
Cholesterol, Total: 102 mg/dL (ref 100–199)
HDL: 34 mg/dL — ABNORMAL LOW (ref >39)
LDL Chol Calc (NIH): 46 mg/dL (ref 0–99)
Triglycerides: 124 mg/dL (ref 0–149)
VLDL Cholesterol Cal: 22 mg/dL (ref 5–40)

## 2023-04-09 MED ORDER — AMOXICILLIN-POT CLAVULANATE 875-125 MG PO TABS: 1.0000 | ORAL_TABLET | Freq: Two times a day (BID) | ORAL | 0 refills | Status: DC

## 2023-04-09 NOTE — Telephone Encounter (Signed)
1.  Please prescribe another course of Augmentin 875 mg p.o. twice daily x 7 days 2.  Schedule ASAP contrast-enhanced CT scan of the abdomen and pelvis "left lower quadrant pain, evaluate". 3.  For severe interval pain, go to ER

## 2023-04-09 NOTE — Telephone Encounter (Signed)
I have spoken to patient to advise of Dr Lamar Sprinkles recommendation for STAT CT abdomen/pelvis and additional course of Augmentin. Patient is scheduled for CT at [facility] on 04/11/23 at 3 pm, 12:45 pm arrival. He is advised of this information and that he should remain NPO after 11 am 04/11/23. Augmentin has been sent to the pharmacy as well. Advised patient to proceed to the ER for severe/worsening abdominal pain in the interm. Patient verbalizes understanding of all of the above.

## 2023-04-09 NOTE — Telephone Encounter (Signed)
Randall Reed (DOD)-  Randall Reed patient calls with c/o intermittent, dull LLQ abdominal pain since last Wednesday night. C/O loose stools with fecal incontinence at times (of note, patient takes miralax and recently upped his dosage to help with pain). He denies any blood in the stool but describes them as more "white" in nature. Also c/o bloating and tenderness in LUQ. +Nausea. Patient denies any fever or chills. He has been taking dicyclomine 10 mg three times daily but this does not seem to help.  Patient with history of severe diverticulosis with associated sigmoid stenosis. Last office visit 10/04/22. Last colon 07/2022. Last CT abdomen/pelvis 05/26/21.  Of note: patient was prescribed Augmentin 875 mg twice daily x 7 days on 02/13/23 by Randall Reed for presumed diverticular flare. Patient states that he took this medication in its entirety and felt well until Wednesday.  No urgent appointments available.  Randall Reed, please advise in Randall Ardell Isaacs absence.Marland KitchenMarland KitchenMarland Kitchen

## 2023-04-10 ENCOUNTER — Other Ambulatory Visit: Payer: BC Managed Care – PPO

## 2023-04-10 MED ORDER — PANTOPRAZOLE SODIUM 40 MG PO TBEC: 40.0000 mg | DELAYED_RELEASE_TABLET | Freq: Two times a day (BID) | ORAL | 0 refills | Status: DC

## 2023-04-10 NOTE — Telephone Encounter (Signed)
See 04/08/23 telephone call for additional correspondence about this problem.

## 2023-04-11 ENCOUNTER — Ambulatory Visit (INDEPENDENT_AMBULATORY_CARE_PROVIDER_SITE_OTHER): Payer: BC Managed Care – PPO

## 2023-04-11 ENCOUNTER — Telehealth: Payer: Self-pay

## 2023-04-11 DIAGNOSIS — K5730 Diverticulosis of large intestine without perforation or abscess without bleeding: Secondary | ICD-10-CM | POA: Diagnosis not present

## 2023-04-11 DIAGNOSIS — R1032 Left lower quadrant pain: Secondary | ICD-10-CM

## 2023-04-11 MED ORDER — IOHEXOL 300 MG/ML  SOLN
200.0000 mL | Freq: Once | INTRAMUSCULAR | Status: AC | PRN
  Administered 2023-04-11: 125 mL via INTRAVENOUS

## 2023-04-11 MED ORDER — IOHEXOL 12 MG/ML PO SOLN
500.0000 mL | ORAL | Status: AC
Start: 2023-04-11 — End: 2023-04-11

## 2023-04-11 NOTE — Telephone Encounter (Signed)
*  Gastro   PA request received via CMM for Pantoprazole Sodium 40MG  dr tablets  PA submitted to [facility] and is pending additional questions/determination  Key: [id number]

## 2023-04-12 ENCOUNTER — Telehealth (HOSPITAL_COMMUNITY): Payer: Self-pay | Admitting: *Deleted

## 2023-04-12 ENCOUNTER — Telehealth (HOSPITAL_COMMUNITY): Payer: Self-pay | Admitting: Emergency Medicine

## 2023-04-12 NOTE — Telephone Encounter (Signed)
Attempted to call patient regarding upcoming cardiac CT appointment. °Left message on voicemail with name and callback number °Noya Santarelli RN Navigator Cardiac Imaging °[facility] °[phone] Office °[phone] Cell ° °

## 2023-04-12 NOTE — Telephone Encounter (Signed)
Reaching out to patient to offer assistance regarding upcoming cardiac imaging study; pt verbalizes understanding of appt date/time, parking situation and where to check in, pre-test NPO status and medications ordered, and verified current allergies; name and call back number provided for further questions should they arise  Larey Brick RN Navigator Cardiac Imaging [facility] [phone] office [phone] cell  Patient takes metoprolol in evening. He will be taking an additional dose on day of test. He is aware to arrive at 2pm.

## 2023-04-15 ENCOUNTER — Ambulatory Visit (HOSPITAL_COMMUNITY)
Admission: RE | Admit: 2023-04-15 | Discharge: 2023-04-15 | Disposition: A | Discharge status: Home / Self Care | Payer: BC Managed Care – PPO | Source: Ambulatory Visit | Attending: Cardiovascular Disease | Admitting: Cardiovascular Disease

## 2023-04-15 ENCOUNTER — Ambulatory Visit: Payer: BC Managed Care – PPO | Admitting: Gastroenterology

## 2023-04-15 ENCOUNTER — Encounter: Payer: Self-pay | Admitting: Gastroenterology

## 2023-04-15 VITALS — BP 98/76 | HR 71 | Ht 71.0 in | Wt 260.0 lb

## 2023-04-15 DIAGNOSIS — K56699 Other intestinal obstruction unspecified as to partial versus complete obstruction: Secondary | ICD-10-CM

## 2023-04-15 DIAGNOSIS — I25118 Atherosclerotic heart disease of native coronary artery with other forms of angina pectoris: Secondary | ICD-10-CM | POA: Diagnosis not present

## 2023-04-15 DIAGNOSIS — K5732 Diverticulitis of large intestine without perforation or abscess without bleeding: Secondary | ICD-10-CM | POA: Diagnosis not present

## 2023-04-15 DIAGNOSIS — I10 Essential (primary) hypertension: Secondary | ICD-10-CM

## 2023-04-15 DIAGNOSIS — E782 Mixed hyperlipidemia: Secondary | ICD-10-CM | POA: Diagnosis not present

## 2023-04-15 MED ORDER — NITROGLYCERIN 0.4 MG SL SUBL
0.8000 mg | SUBLINGUAL_TABLET | Freq: Once | SUBLINGUAL | Status: AC
  Administered 2023-04-15: 0.8 mg via SUBLINGUAL

## 2023-04-15 MED ORDER — NITROGLYCERIN 0.4 MG SL SUBL
SUBLINGUAL_TABLET | SUBLINGUAL | Status: AC
  Filled 2023-04-15: qty 2

## 2023-04-15 MED ORDER — IOHEXOL 350 MG/ML SOLN
100.0000 mL | Freq: Once | INTRAVENOUS | Status: AC | PRN
  Administered 2023-04-15: 100 mL via INTRAVENOUS

## 2023-04-15 MED ORDER — PANTOPRAZOLE SODIUM 40 MG PO TBEC: 40.0000 mg | DELAYED_RELEASE_TABLET | Freq: Two times a day (BID) | ORAL | 1 refills | Status: DC

## 2023-04-15 MED ORDER — METOPROLOL TARTRATE 5 MG/5ML IV SOLN
INTRAVENOUS | Status: AC
  Filled 2023-04-15: qty 5

## 2023-04-15 MED ORDER — METOPROLOL TARTRATE 5 MG/5ML IV SOLN
5.0000 mg | Freq: Once | INTRAVENOUS | Status: AC
  Administered 2023-04-15: 5 mg via INTRAVENOUS

## 2023-04-15 NOTE — Progress Notes (Signed)
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  He recently developed recurrent diverticulitis with LLQ pain which resolved with a course of Augmentin.  See CT scan below.  He has ongoing difficulties with constipation, left sided abdominal pain   Labs / Imaging       Latest Ref Rng & Units 04/08/2023    2:51 PM 06/28/2022   10:40 AM 05/02/2022   10:14 AM  Hepatic Function  Total Protein 6.0 - 8.5 g/dL 7.0  6.9  7.0   Albumin 3.8 - 4.9 g/dL 4.6  4.4  4.5   AST 0 - 40 IU/L 17  16  15    ALT 0 - 44 IU/L 20  19  16    Alk Phosphatase 44 - 121 IU/L 110  73  86   Total Bilirubin 0.0 - 1.2 mg/dL 0.5  0.7  0.6        Latest Ref Rng & Units 06/28/2022    9:31 AM 05/02/2022   10:14 AM 04/23/2022    5:26 PM  CBC  WBC 4.0 - 10.5 K/uL 7.7  5.0  10.8   Hemoglobin 13.0 - 17.0 g/dL 54.0  98.1  19.1   Hematocrit 39.0 - 52.0 % 50.4  48.2  50.8   Platelets 150.0 - 400.0 K/uL 204.0  173.0  184      CT Abdomen Pelvis W Contrast CLINICAL DATA:  Left lower quadrant abdominal pain, diverticulosis, loose stools and incontinence  EXAM: CT ABDOMEN AND PELVIS WITH CONTRAST  TECHNIQUE: Multidetector CT imaging of the abdomen and pelvis was performed using the standard protocol following bolus administration of intravenous contrast.  RADIATION DOSE REDUCTION: This exam was  performed according to the departmental dose-optimization program which includes automated exposure control, adjustment of the mA and/or kV according to patient size and/or use of iterative reconstruction technique.  CONTRAST:  OMNIPAQUE IOHEXOL 300 MG/ML  SOLN  COMPARISON:  05/02/2022  FINDINGS: Lower chest: No acute abnormality.  Hepatobiliary: No solid liver abnormality is seen. Unchanged benign cyst of the superior left lobe of the liver, hepatic segment II, for which no further follow-up or characterization is required (series 2, image 21). No gallstones, gallbladder wall thickening, or biliary dilatation.  Pancreas: Unremarkable. No pancreatic ductal dilatation or surrounding inflammatory changes.  Spleen: Normal in size without significant abnormality.  Adrenals/Urinary Tract: Adrenal glands are unremarkable. Kidneys are normal, without renal calculi, solid lesion, or hydronephrosis. Bladder is unremarkable.  Stomach/Bowel: Stomach is within normal limits. Appendix appears normal. Sigmoid diverticulosis. Interval increase in circumferential wall thickening and fat stranding about the mid sigmoid (series 2, image 92).  Vascular/Lymphatic: No significant vascular findings are present. No enlarged abdominal or pelvic lymph nodes.  Reproductive: Prostatomegaly.  Other: Small fat containing umbilical hernia. Small, fat containing right inguinal hernia. No ascites.  Musculoskeletal: No acute or significant osseous findings.  IMPRESSION: 1.  Sigmoid diverticulosis with interval increase in circumferential wall thickening and fat stranding about the mid sigmoid, consistent with acute diverticulitis. No evidence of complicating perforation or abscess at this time. 2. Prostatomegaly.  Electronically Signed   By: Jearld Lesch M.D.   On: 04/11/2023 15:49   Current Medications, Allergies, Past Medical History, Past Surgical History, Family History and Social  History were reviewed in [employer] record.   Physical Exam: General: Well developed, well nourished, no acute distress Head: Normocephalic and atraumatic Eyes: Sclerae anicteric, EOMI Ears: Normal auditory acuity Mouth: No deformities or lesions noted Lungs: Clear throughout to auscultation Heart: Regular rate and rhythm; No murmurs, rubs or bruits Abdomen: Soft, non tender and non distended. No masses, HSM Rectal: Not done Musculoskeletal: Symmetrical with no gross deformities  Pulses:  Normal pulses noted Extremities: No edema or deformities noted Neurological: Alert oriented x 4, grossly nonfocal Psychological:  Alert and cooperative. Normal mood and affect   Deshawnda Acrey T. Russella Dar, MD 04/15/2023, 9:58 AM

## 2023-04-15 NOTE — Progress Notes (Signed)
Patient tolerated CT well.Vital signs stable encourage to drink water throughout day.Reasons explained and verbalized understanding. Ambulated steady gait.   

## 2023-04-15 NOTE — Patient Instructions (Signed)
We have sent the following medications to your pharmacy for you to pick up at your convenience:  Pantoprazole.  You have been referred to [facility].    _______________________________________________________  If your blood pressure at your visit was 140/90 or greater, please contact your primary care physician to follow up on this.  _______________________________________________________  If you are age 54 or older, your body mass index should be between 23-30. Your Body mass index is 36.26 kg/m. If this is out of the aforementioned range listed, please consider follow up with your Primary Care Provider.  If you are age 27 or younger, your body mass index should be between 19-25. Your Body mass index is 36.26 kg/m. If this is out of the aformentioned range listed, please consider follow up with your Primary Care Provider.   ________________________________________________________  The [place] [facility] providers would like to encourage you to use [facility] to communicate with providers for non-urgent requests or questions.  Due to long hold times on the telephone, sending your provider a message by [facility] may be a faster and more efficient way to get a response.  Please allow 48 business hours for a response.  Please remember that this is for non-urgent requests.  _______________________________________________________

## 2023-04-15 NOTE — Telephone Encounter (Signed)
Questions were generated and submitted.

## 2023-04-17 NOTE — Telephone Encounter (Signed)
PA has been APPROVED from 04/16/2023-04/13/2024

## 2023-04-18 ENCOUNTER — Other Ambulatory Visit: Payer: Self-pay | Admitting: Family Medicine

## 2023-04-18 DIAGNOSIS — N419 Inflammatory disease of prostate, unspecified: Secondary | ICD-10-CM

## 2023-04-18 DIAGNOSIS — R339 Retention of urine, unspecified: Secondary | ICD-10-CM

## 2023-04-18 DIAGNOSIS — J3089 Other allergic rhinitis: Secondary | ICD-10-CM | POA: Diagnosis not present

## 2023-04-18 DIAGNOSIS — J301 Allergic rhinitis due to pollen: Secondary | ICD-10-CM | POA: Diagnosis not present

## 2023-04-18 DIAGNOSIS — R350 Frequency of micturition: Secondary | ICD-10-CM

## 2023-04-19 DIAGNOSIS — Z125 Encounter for screening for malignant neoplasm of prostate: Secondary | ICD-10-CM | POA: Diagnosis not present

## 2023-04-19 DIAGNOSIS — E230 Hypopituitarism: Secondary | ICD-10-CM | POA: Diagnosis not present

## 2023-04-19 DIAGNOSIS — E669 Obesity, unspecified: Secondary | ICD-10-CM | POA: Diagnosis not present

## 2023-04-19 DIAGNOSIS — N400 Enlarged prostate without lower urinary tract symptoms: Secondary | ICD-10-CM | POA: Diagnosis not present

## 2023-04-24 ENCOUNTER — Other Ambulatory Visit (HOSPITAL_COMMUNITY)
Admission: RE | Admit: 2023-04-24 | Discharge: 2023-04-24 | Disposition: A | Discharge status: Home / Self Care | Payer: BC Managed Care – PPO | Source: Ambulatory Visit | Attending: Family Medicine | Admitting: Family Medicine

## 2023-04-24 ENCOUNTER — Ambulatory Visit: Payer: BC Managed Care – PPO | Admitting: Family Medicine

## 2023-04-24 ENCOUNTER — Encounter: Payer: Self-pay | Admitting: Family Medicine

## 2023-04-24 VITALS — BP 118/72 | HR 76 | Temp 98.5°F | Ht 71.0 in | Wt 262.4 lb

## 2023-04-24 DIAGNOSIS — Z113 Encounter for screening for infections with a predominantly sexual mode of transmission: Secondary | ICD-10-CM | POA: Diagnosis not present

## 2023-04-24 DIAGNOSIS — R339 Retention of urine, unspecified: Secondary | ICD-10-CM

## 2023-04-24 DIAGNOSIS — Z79899 Other long term (current) drug therapy: Secondary | ICD-10-CM | POA: Diagnosis not present

## 2023-04-24 DIAGNOSIS — F411 Generalized anxiety disorder: Secondary | ICD-10-CM | POA: Diagnosis not present

## 2023-04-24 DIAGNOSIS — F5104 Psychophysiologic insomnia: Secondary | ICD-10-CM

## 2023-04-24 DIAGNOSIS — J45909 Unspecified asthma, uncomplicated: Secondary | ICD-10-CM

## 2023-04-24 DIAGNOSIS — I10 Essential (primary) hypertension: Secondary | ICD-10-CM

## 2023-04-24 DIAGNOSIS — N400 Enlarged prostate without lower urinary tract symptoms: Secondary | ICD-10-CM | POA: Diagnosis not present

## 2023-04-24 DIAGNOSIS — K589 Irritable bowel syndrome without diarrhea: Secondary | ICD-10-CM

## 2023-04-24 DIAGNOSIS — R7989 Other specified abnormal findings of blood chemistry: Secondary | ICD-10-CM

## 2023-04-24 LAB — PSA: PSA: 0.76 ng/mL (ref 0.10–4.00)

## 2023-04-24 MED ORDER — VITAMIN D (ERGOCALCIFEROL) 1.25 MG (50000 UNIT) PO CAPS: 50000.0000 [IU] | ORAL_CAPSULE | ORAL | 0 refills | Status: DC

## 2023-04-24 MED ORDER — TAMSULOSIN HCL 0.4 MG PO CAPS
ORAL_CAPSULE | ORAL | 0 refills | Status: DC
Start: 2023-04-24 — End: 2023-05-20

## 2023-04-24 MED ORDER — EMTRICITABINE-TENOFOVIR DF 200-300 MG PO TABS: 1.0000 | ORAL_TABLET | Freq: Every day | ORAL | 1 refills | Status: DC

## 2023-04-24 MED ORDER — ZOLPIDEM TARTRATE 10 MG PO TABS: ORAL_TABLET | ORAL | 0 refills | Status: DC

## 2023-04-24 NOTE — Progress Notes (Signed)
Subjective:  Patient ID: [name]., male    DOB: 05-May-1971  Age: 52 y.o. MRN: [id number]  CC:  Chief Complaint  Patient presents with   Medical Management of Chronic Issues    Notes some gastro issues and has been seeing gastroenterology and is being sent to surgeon to consult as well FYI    Results    Review a few values that ae borderline from [facility] blood work, notes that dr did not comment on these     HPI Shelley Barbian. presents for   Asthma with allergic rhinitis Followed by ENT, allergist.  Stable on current meds.  Vitamin D deficiency Last vitamin D Lab Results  Component Value Date   VD25OH 43.26 10/25/2022  50,000 units once per week.  Hypertension: Followed cardiology, Dr. Excell Seltzer, treated with losartan, Toprol, diltiazem, potassium.  Recent visit in June. No new med side effects.  Home readings: BP Readings from Last 3 Encounters:  04/24/23 118/72  04/15/23 113/81  04/15/23 98/76   Lab Results  Component Value Date   CREATININE 1.21 04/08/2023   Hyperlipidemia: Crestor 10 mg daily, followed by cardiology as above.  Recent labs noted. Lab Results  Component Value Date   CHOL 102 04/09/2023   HDL 34 (L) 04/09/2023   LDLCALC 46 04/09/2023   TRIG 124 04/09/2023   CHOLHDL 3.0 04/09/2023   Lab Results  Component Value Date   ALT 20 04/08/2023   AST 17 04/08/2023   ALKPHOS 110 04/08/2023   BILITOT 0.5 04/08/2023   Endocrine: history of prediabetes, normal labs in December.  Followed by endocrinology and urology with history of low testosterone, treated with testosterone and Arimidex. Saw endocrine last week.  Tamsulosin daily - needs refill. No new se's, helps with incomplete emptying.  Prostatomegaly on CT 04/11/23. No recent psa known.  Lab Results  Component Value Date   PSA1 0.5 10/14/2020   PSA 0.76 04/24/2023   PSA 0.58 05/02/2022   PSA 0.65 03/22/2022      Lab Results  Component Value Date   HGBA1C 5.2 10/25/2022    Wt Readings from Last 3 Encounters:  04/24/23 262 lb 6.4 oz (119 kg)  04/15/23 260 lb (117.9 kg)  04/08/23 263 lb 6.4 oz (119.5 kg)   Anxiety/insomnia Generalized anxiety disorder.  Attempted multiple different SSRIs in the past without benefit or tolerance.  Ultimately has been well-controlled with use of alprazolam.  We discussed the risks of this medication and continued attempts at tapering.  Takes anywhere from 1 to 3/day depending on his stress levels.  Sometimes none. Work stressful but going well. On average takes 2/day. Takes Ambien in the evening for sleep/insomnia but does not combine with alprazolam.  Working well. He denies parasomnias, or other side effects with current medications. Alprazolam No. 60 on 04/03/2023, Ambien No. 90 on 01/15/2023. Controlled substance database (PDMP) reviewed. No concerns appreciated.    History of migraine headaches Treated with Maxalt as needed. 1-2 per month, no recent increase.   HIV preexposure prophylaxis Truvada daily Denies penile discharge or rash or other symptoms of STI.  Last STI screening in December.  1 new sexual partner, unprotected, since last testing.  Lab Results  Component Value Date   CREATININE 1.21 04/08/2023   Gastrointestinal: Followed by GI, history of IBS, diverticulitis.Visit June 10 with Dr. Russella Dar.  Refer to general surgery to consider segmental colectomy for recurrent diverticulitis and sigmoid colon stricture.  Continued on MiraLAX and Dulcolax, pantoprazole.  Appt with surgery July 15th.   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[facility]) 06/04/2014   rotator cuff tear 10/17/2013   Hypogonadism male 09/04/2013   ED (erectile dysfunction) 07/30/2013   Palpitations 02/21/2012   BMI  33.0-33.9,adult 02/04/2012   Allergic rhinitis 02/04/2012   GERD (gastroesophageal reflux disease) 02/04/2012   Migraine 02/04/2012   GAD (generalized anxiety disorder) 02/04/2012   Insomnia 02/04/2012   Essential hypertension 04/13/2009   CHEST PAIN-UNSPECIFIED 04/13/2009   Past Medical History:  Diagnosis Date   Anxiety    Arthritis    Asthma    Depression    GERD (gastroesophageal reflux disease)    Hyperlipidemia    [employer]rees[employer] 12/05/2020   Seasonal allergic reaction    Past Surgical History:  Procedure Laterality Date   LIPOMA EXCISION N/A 10/30/2018   Procedure: EXCISION OF MULTIPLE SUBCUTANEOUS LIPOMAS ON TORSO;  Surgeon: Manus Rudd, MD;  Location: [facility];  Service: General;  Laterality: N/A;   Allergies  Allergen Reactions   Desloratadine Other (See Comments)    CLARINEX-"severe headache"   Loratadine Other (See Comments)    "severe headache"   Other    Levbid [Hyoscyamine Sulfate] Rash   Prior to Admission medications   Medication Sig Start Date End Date Taking? Authorizing Provider  AIRSUPRA 90-80 MCG/ACT AERO SMARTSIG:2 Puff(s) By Mouth Every 4-6 Hours PRN 03/30/23  Yes [provider]  albuterol (VENTOLIN HFA) 108 (90 Base) MCG/ACT inhaler INHALE 2 PUFFS INTO THE LUNGS EVERY 6 HOURS AS NEEDED FOR WHEEZING OR SHORTNESS OF BREATH 07/19/20  Yes Shade Flood, MD  [facility] ALLERGY 180 MG tablet Take 180 mg by mouth daily. 07/12/20  Yes [provider]  ALPRAZolam Prudy Feeler) 1 MG tablet TAKE 1 TABLET BY MOUTH TWICE DAILY AS NEEDED. 04/03/23  Yes Shade Flood, MD  anastrozole (ARIMIDEX) 1 MG tablet Take 1 mg by mouth daily.   Yes [provider]  aspirin EC 81 MG tablet Take 1 tablet (81 mg total) by mouth daily. 03/07/15  Yes Tonny Bollman, MD  Budeson-Glycopyrrol-Formoterol (BREZTRI AEROSPHERE) 160-9-4.8 MCG/ACT AERO  09/14/21  Yes [provider]  celecoxib (CELEBREX) 200 MG capsule Take 200 mg by mouth 2  (two) times daily. 10/23/22  Yes [provider]  Diclofenac Sodium (PENNSAID) 2 % SOLN Apply topically as needed.  04/08/20  Yes [provider]  dicyclomine (BENTYL) 20 MG tablet Take 1 tablet (20 mg total) by mouth 3 (three) times daily before meals. 02/13/23  Yes Meryl Dare, MD  diltiazem (CARDIZEM CD) 180 MG 24 hr capsule TAKE 1 CAPSULE(180 MG) BY MOUTH DAILY 04/08/23  Yes Tonny Bollman, MD  DUPIXENT 300 MG/2ML prefilled syringe Inject into the skin. 10/06/21  Yes [provider]  emtricitabine-tenofovir (TRUVADA) 200-300 MG tablet TAKE 1 TABLET BY MOUTH EVERY DAY 11/15/22  Yes Shade Flood, MD  esomeprazole (NEXIUM) 40 MG capsule Take by mouth. 11/07/12  Yes [provider]  famotidine (PEPCID) 40 MG tablet Take 40 mg by mouth at bedtime. 02/18/20  Yes [provider]  hydrocortisone (ANUSOL-HC) 2.5 % rectal cream APPLY RECTALLY TWICE DAILY 06/30/20  Yes Shade Flood, MD  ipratropium (ATROVENT) 0.06 % nasal spray Place 2 sprays into the nose 2 (two) times daily. 05/25/21  Yes Neva Seat,  Asencion Partridge, MD  ipratropium-albuterol (DUONEB) 0.5-2.5 (3) MG/3ML SOLN USE 1 VIAL VIA NEBULIZER EVERY 4 TO 6 HOURS AS NEEDED FOR COUGH OR WHEEZING OR SHORTNESS OF BREATH OR CHEST TIGHTNESS 12/25/21  Yes [provider]  losartan (COZAAR) 25 MG tablet TAKE 1 TABLET(25 MG) BY MOUTH DAILY 04/08/23  Yes Tonny Bollman, MD  metoprolol succinate (TOPROL-XL) 25 MG 24 hr tablet Take 1 tablet (25 mg total) by mouth daily. 04/08/23  Yes Tonny Bollman, MD  mometasone-formoterol [facility]) 100-5 MCG/ACT AERO Inhale 2 puffs into the lungs 2 (two) times daily.   Yes [provider]  montelukast (SINGULAIR) 10 MG tablet TAKE 1 TABLET(10 MG) BY MOUTH AT BEDTIME 01/28/23  Yes Shade Flood, MD  mupirocin ointment (BACTROBAN) 2 % Apply 1 application topically 2 (two) times daily. 11/13/21  Yes Shade Flood, MD  Olopatadine-Mometasone [facility]) [id number] MCG/ACT SUSP  Place into the nose.   Yes [provider]  ondansetron (ZOFRAN-ODT) 4 MG disintegrating tablet DISSOLVE 1 TABLET(4 MG) ON THE TONGUE EVERY 8 HOURS AS NEEDED FOR NAUSEA OR VOMITING 09/12/22  Yes Meryl Dare, MD  oxyCODONE (OXY IR/ROXICODONE) 5 MG immediate release tablet Take 5 mg by mouth every 6 (six) hours as needed. 10/23/22  Yes [provider]  pantoprazole (PROTONIX) 40 MG tablet Take 1 tablet (40 mg total) by mouth 2 (two) times daily. 04/15/23  Yes Meryl Dare, MD  Potassium Chloride ER 20 MEQ TBCR Take 1 tablet (20 mEq total) by mouth daily. 04/08/23  Yes Tonny Bollman, MD  pramoxine-hydrocortisone (PROCTOCREAM-HC) 1-1 % rectal cream Place rectally 3 (three) times daily. 05/07/22  Yes Shade Flood, MD  promethazine-dextromethorphan (PROMETHAZINE-DM) 6.25-15 MG/5ML syrup Take 5 mLs by mouth 3 (three) times daily as needed. 08/23/22  Yes [provider]  rizatriptan (MAXALT-MLT) 10 MG disintegrating tablet DISSOLVE 1 TABLET ON THE TONGUE AS NEEDED FOR HEADACHE. MAY REPEAT IN 2 HOURS AS NEEDED 10/25/22  Yes Shade Flood, MD  rosuvastatin (CRESTOR) 10 MG tablet Take 1 tablet (10 mg total) by mouth daily. 04/08/23  Yes Tonny Bollman, MD  sildenafil (VIAGRA) 100 MG tablet Take 1/2-1 tablet by mouth once daily as needed prior to sexual activity *MUST KEEP UPCOMING APPT* 04/02/23  Yes Tonny Bollman, MD  Spacer/Aero-Holding Chambers DEVI Use with inhaler 05/15/22  Yes Icard, Rachel Bo, DO  tadalafil (CIALIS) 5 MG tablet Take 5 mg by mouth daily. 02/01/23  Yes [provider]  tamsulosin (FLOMAX) 0.4 MG CAPS capsule TAKE 1 CAPSULE(0.4 MG) BY MOUTH DAILY AS NEEDED 04/18/23  Yes Shade Flood, MD  testosterone cypionate (DEPOTESTOSTERONE CYPIONATE) 200 MG/ML injection SMARTSIG:1 Milliliter(s) IM Every 10 Days 07/07/22  Yes [provider]  triamcinolone cream (KENALOG) 0.1 % Apply topically daily. 06/17/22  Yes [provider]  Vitamin  D, Ergocalciferol, (DRISDOL) 1.25 MG (50000 UNIT) CAPS capsule Take 1 capsule (50,000 Units total) by mouth every 7 (seven) days. 10/25/22  Yes Shade Flood, MD  zolpidem (AMBIEN) 10 MG tablet TAKE 1 TABLET(10 MG) BY MOUTH AT BEDTIME 01/15/23  Yes Shade Flood, MD  dicyclomine (BENTYL) 20 MG tablet Take 20 mg by mouth 3 (three) times daily before meals.    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)   Occupation: [employer]  Tobacco Use   Smoking status: Never   Smokeless tobacco: Never  Vaping Use   Vaping Use: Never used  Substance and Sexual Activity   Alcohol use: Yes    Alcohol/week: 3.0 standard drinks of alcohol    Types: 3 Standard drinks or equivalent per week    Comment: 1 a month   Drug use: No   Sexual activity: Yes  Other Topics Concern   Not on file  Social History Narrative   Not on file   Social Determinants of Health   Financial Resource Strain: Not on file  Food Insecurity: No Food Insecurity (04/23/2023)   Hunger Vital Sign    Worried About Running Out of Food in the Last Year: Never true    Ran Out of Food in the Last Year: Never true  Transportation Needs: No Transportation Needs (04/23/2023)   PRAPARE - Administrator, Civil Service (Medical): No    Lack of Transportation (Non-Medical): No  Physical Activity: Sufficiently Active (04/23/2023)   Exercise Vital Sign    Days of Exercise per Week: 5 days    Minutes of Exercise per Session: 90 min  Stress: Stress Concern Present (04/23/2023)   [employer] of Occupational Health - Occupational Stress Questionnaire    Feeling of Stress : To some extent  Social Connections: Unknown  (04/23/2023)   Social Connection and Isolation Panel [NHANES]    Frequency of Communication with Friends and Family: More than three times a week    Frequency of Social Gatherings with Friends and Family: Three times a week    Attends Religious Services: More than 4 times per year    Active Member of Clubs or Organizations: Yes    Attends Banker Meetings: 1 to 4 times per year    Marital Status: Patient declined  Intimate Partner Violence: Not on file    Review of Systems Per HPI.   Objective:   Vitals:   04/24/23 0836  BP: 118/72  Pulse: 76  Temp: 98.5 F (36.9 C)  TempSrc: Temporal  SpO2: 94%  Weight: 262 lb 6.4 oz (119 kg)  Height: 5\' 11"  (1.803 m)     Physical Exam Vitals reviewed.  Constitutional:      Appearance: He is well-developed.  HENT:     Head: Normocephalic and atraumatic.  Neck:     Vascular: No carotid bruit or JVD.  Cardiovascular:     Rate and Rhythm: Normal rate and regular rhythm.     Heart sounds: Normal heart sounds. No murmur heard. Pulmonary:     Effort: Pulmonary effort is normal.     Breath sounds: Normal breath sounds. No rales.  Abdominal:     Tenderness: There is abdominal tenderness (Minimal left upper quadrant without rebound or guarding.  Not painful but slight discomfort.  Denies acute changes.).  Musculoskeletal:     Right lower leg: No edema.     Left lower leg: No edema.  Skin:    General: Skin is warm and dry.  Neurological:     Mental Status: He is alert and oriented to person, place, and time.  Psychiatric:        Mood and Affect: Mood normal.        Assessment & Plan:  Rolan Noteboom. is a 52 y.o. male . Essential hypertension  -  Stable, tolerating current  regimen.  Recent labs noted.  No med changes at this time.  Incomplete emptying of bladder - Plan: tamsulosin (FLOMAX) 0.4 MG CAPS capsule, PSA  -Stable with use of tamsulosin.  Continue same.  PSA obtained, stable.  Other labs still pending.   Continue follow-up with urology, endocrinology regarding testosterone treatment, ongoing monitoring of PSA recommended.  GAD (generalized anxiety disorder) Psychophysiological insomnia - Plan: zolpidem (AMBIEN) 10 MG tablet  -Stable with approximately 2 alprazolam per day on average, Ambien at night for insomnia.  Not combining.  Some days without meds for anxiety, other days requiring 3.  Again, overall stable.  Ambien refilled as due, then can refill alprazolam once that is due for refill.  No concerns on proposals days, denies any side effects or increased need  On pre-exposure prophylaxis for HIV - Plan: emtricitabine-tenofovir (TRUVADA) 200-300 MG tablet Routine screening for STI (sexually transmitted infection) - Plan: HIV Antibody (routine testing w rflx), Urine cytology ancillary only, RPR, PSA  -Safer sex practices discussed, tolerating Truvada, continue same.  Check STI screening.  Recent renal function noted.  Low vitamin D level - Plan: Vitamin D, Ergocalciferol, (DRISDOL) 1.25 MG (50000 UNIT) CAPS capsule  -Continue supplementation, check updated labs and adjust plan accordingly  Uncomplicated asthma, unspecified asthma severity, unspecified whether persistent  -Stable with current regimen, continue follow-up with allergist, ENT  Prostate enlargement - Plan: PSA  -As above stable with tamsulosin and PSA stable  Irritable bowel syndrome, unspecified type  -With history of recurrent diverticulitis.  Plan for surgical evaluation for possible partial colectomy.  Intermittent abdominal discomfort, denies acute changes minimal discomfort on exam.  No fever, RTC/ER precautions given.  Continue to discuss with his gastroenterologist.  Meds ordered this encounter  Medications   tamsulosin (FLOMAX) 0.4 MG CAPS capsule    Sig: TAKE 1 CAPSULE(0.4 MG) BY MOUTH DAILY AS NEEDED    Dispense:  90 capsule    Refill:  0   emtricitabine-tenofovir (TRUVADA) 200-300 MG tablet    Sig: Take 1  tablet by mouth daily.    Dispense:  90 tablet    Refill:  1   Vitamin D, Ergocalciferol, (DRISDOL) 1.25 MG (50000 UNIT) CAPS capsule    Sig: Take 1 capsule (50,000 Units total) by mouth every 7 (seven) days.    Dispense:  15 capsule    Refill:  0   zolpidem (AMBIEN) 10 MG tablet    Sig: TAKE 1 TABLET(10 MG) BY MOUTH AT BEDTIME    Dispense:  90 tablet    Refill:  0   Patient Instructions  I will check PSA but follow-up with urology and endocrinology as planned.  No med changes for me at this time.  If any concerns on labs I will let you know.  Good luck at upcoming appointment with general surgery.  If any acute changes with your abdominal symptoms or new symptoms be seen as we discussed.  Thanks for coming in today and let me know if there are questions.      Signed,   Meredith Staggers, MD [facility], [facility] 04/24/23 1:46 PM

## 2023-04-24 NOTE — Patient Instructions (Signed)
I will check PSA but follow-up with urology and endocrinology as planned.  No med changes for me at this time.  If any concerns on labs I will let you know.  Good luck at upcoming appointment with general surgery.  If any acute changes with your abdominal symptoms or new symptoms be seen as we discussed.  Thanks for coming in today and let me know if there are questions.

## 2023-04-25 ENCOUNTER — Encounter: Payer: Self-pay | Admitting: Family Medicine

## 2023-04-25 LAB — URINE CYTOLOGY ANCILLARY ONLY
Chlamydia: NEGATIVE
Comment: NEGATIVE
Comment: NEGATIVE
Comment: NORMAL
Neisseria Gonorrhea: NEGATIVE
Trichomonas: NEGATIVE

## 2023-04-25 LAB — HIV ANTIBODY (ROUTINE TESTING W REFLEX): HIV 1&2 Ab, 4th Generation: NONREACTIVE

## 2023-04-25 LAB — RPR: RPR Ser Ql: NONREACTIVE

## 2023-04-27 ENCOUNTER — Other Ambulatory Visit: Payer: Self-pay | Admitting: Family Medicine

## 2023-04-27 DIAGNOSIS — R1032 Left lower quadrant pain: Secondary | ICD-10-CM

## 2023-04-27 DIAGNOSIS — K5792 Diverticulitis of intestine, part unspecified, without perforation or abscess without bleeding: Secondary | ICD-10-CM

## 2023-04-27 MED ORDER — AMOXICILLIN-POT CLAVULANATE 875-125 MG PO TABS
1.0000 | ORAL_TABLET | Freq: Two times a day (BID) | ORAL | 0 refills | Status: DC
Start: 2023-04-27 — End: 2023-07-17

## 2023-04-27 NOTE — Progress Notes (Signed)
See MyChart message, recurrent diverticulitis, with return of abdominal pain, left-sided, will treat with Augmentin, advised to contact GI on Monday to determine if repeat imaging or office visit needed.  Potential side effects and risks of recurrent antibiotics discussed.

## 2023-04-30 ENCOUNTER — Other Ambulatory Visit: Payer: Self-pay | Admitting: Family Medicine

## 2023-04-30 DIAGNOSIS — F411 Generalized anxiety disorder: Secondary | ICD-10-CM

## 2023-05-01 NOTE — Telephone Encounter (Signed)
Medication discussed at his office visit June 19.  Controlled substance database reviewed.  Alprazolam 1 mg #60 last filled on 04/03/2023.  Refill ordered.

## 2023-05-01 NOTE — Telephone Encounter (Signed)
Xanax 1 mg LOV: 04/24/23 Last Refill:5/292/4 Upcoming appt: 10/25/23

## 2023-05-07 DIAGNOSIS — J301 Allergic rhinitis due to pollen: Secondary | ICD-10-CM | POA: Diagnosis not present

## 2023-05-07 DIAGNOSIS — J3089 Other allergic rhinitis: Secondary | ICD-10-CM | POA: Diagnosis not present

## 2023-05-10 DIAGNOSIS — R350 Frequency of micturition: Secondary | ICD-10-CM | POA: Diagnosis not present

## 2023-05-10 DIAGNOSIS — R102 Pelvic and perineal pain: Secondary | ICD-10-CM | POA: Diagnosis not present

## 2023-05-10 DIAGNOSIS — N529 Male erectile dysfunction, unspecified: Secondary | ICD-10-CM | POA: Diagnosis not present

## 2023-05-10 DIAGNOSIS — E291 Testicular hypofunction: Secondary | ICD-10-CM | POA: Diagnosis not present

## 2023-05-15 ENCOUNTER — Telehealth: Payer: Self-pay | Admitting: Gastroenterology

## 2023-05-15 NOTE — Telephone Encounter (Signed)
Inbound call from patient wanting to speak with a nurse in regarding medication Bentyl. Patient stated medication is not been working for him lately.Please advise

## 2023-05-16 MED ORDER — GLYCOPYRROLATE 2 MG PO TABS
2.0000 mg | ORAL_TABLET | Freq: Two times a day (BID) | ORAL | 1 refills | Status: DC
Start: 2023-05-16 — End: 2023-10-25

## 2023-05-16 NOTE — Telephone Encounter (Signed)
Stools Every 20 mins yesterday  Skipped Miralax today and only ate once and was better  Has years of IBS and has been on dicyclomine for 15 years.  He is wondering about trying a different antispasmodic.  Will try glycopyrrolate.  Have recommended MiraLAX every other day given his diarrhea issues.  Explained we want to keep the stools soft.  Discussed possibility of impaction but I doubt there was an impaction if the diarrhea stopped today (he had to travel to [place] so he deliberately minimized eating and skip the MiraLAX and was better).  Keep surgery appointment Monday, call back as needed  Meds ordered this encounter  Medications   glycopyrrolate (ROBINUL) 2 MG tablet    Sig: Take 1 tablet (2 mg total) by mouth 2 (two) times daily.    Dispense:  60 tablet    Refill:  1

## 2023-05-16 NOTE — Telephone Encounter (Signed)
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  He has a surgical referral on Monday with [facility] to be evaluated for colon resection for recurrent diverticulitis.  Patient is struggling with alternating bowel habits and the titration of Miralax.  He has one extreme or another diarrhea or severe constipation.  He has tried titrating the Miralax with little success.  He reports that the dicyclomine is not helping with his abdominal discomfort at all.  He is requesting an alternate anti-spasmodic.  We discussed that he should continue to titrate the Miralax as he needs it.  Yesterday he was not able to remain at work due to diarrhea. He is advised he can add a fiber supplement to help bulk his stools, but cautioned that he needs to make sure he starts slowly and to drink plenty of water.    Dr. Leone Payor you are DOD this pm, please advise if there is an alternate antispasmodic you would like to prescribe.

## 2023-05-18 ENCOUNTER — Other Ambulatory Visit: Payer: Self-pay | Admitting: Family Medicine

## 2023-05-18 DIAGNOSIS — R339 Retention of urine, unspecified: Secondary | ICD-10-CM

## 2023-05-20 DIAGNOSIS — K5792 Diverticulitis of intestine, part unspecified, without perforation or abscess without bleeding: Secondary | ICD-10-CM | POA: Diagnosis not present

## 2023-05-23 DIAGNOSIS — J301 Allergic rhinitis due to pollen: Secondary | ICD-10-CM | POA: Diagnosis not present

## 2023-05-23 DIAGNOSIS — J3089 Other allergic rhinitis: Secondary | ICD-10-CM | POA: Diagnosis not present

## 2023-05-27 ENCOUNTER — Other Ambulatory Visit: Payer: Self-pay | Admitting: Family Medicine

## 2023-05-27 DIAGNOSIS — F411 Generalized anxiety disorder: Secondary | ICD-10-CM

## 2023-05-28 NOTE — Telephone Encounter (Signed)
Xanax 1 mg LOV: 04/24/23 Last Refill:05/01/23 Upcoming appt: 10/25/23

## 2023-05-28 NOTE — Telephone Encounter (Signed)
Medication discussed at June 19 visit.  1 to 3/day on average 2/day.  Controlled substance database reviewed.  #60 filled on 05/02/2023, refill ordered.

## 2023-06-09 DIAGNOSIS — R059 Cough, unspecified: Secondary | ICD-10-CM | POA: Insufficient documentation

## 2023-06-09 DIAGNOSIS — J45909 Unspecified asthma, uncomplicated: Secondary | ICD-10-CM | POA: Diagnosis not present

## 2023-06-11 DIAGNOSIS — M62838 Other muscle spasm: Secondary | ICD-10-CM | POA: Diagnosis not present

## 2023-06-11 DIAGNOSIS — M6281 Muscle weakness (generalized): Secondary | ICD-10-CM | POA: Diagnosis not present

## 2023-06-11 DIAGNOSIS — K5902 Outlet dysfunction constipation: Secondary | ICD-10-CM | POA: Diagnosis not present

## 2023-06-11 DIAGNOSIS — M6289 Other specified disorders of muscle: Secondary | ICD-10-CM | POA: Diagnosis not present

## 2023-06-12 DIAGNOSIS — J301 Allergic rhinitis due to pollen: Secondary | ICD-10-CM | POA: Diagnosis not present

## 2023-06-12 DIAGNOSIS — J3089 Other allergic rhinitis: Secondary | ICD-10-CM | POA: Diagnosis not present

## 2023-06-26 ENCOUNTER — Other Ambulatory Visit: Payer: Self-pay | Admitting: Family Medicine

## 2023-06-26 DIAGNOSIS — F411 Generalized anxiety disorder: Secondary | ICD-10-CM

## 2023-06-26 NOTE — Telephone Encounter (Signed)
Controlled substance database reviewed.  Alprazolam No. 60 last filled on 05/30/2023.  Refill ordered for when due.  Medication discussed in June.

## 2023-06-26 NOTE — Telephone Encounter (Signed)
Xanax 1 mg  Requested Prescriptions   Pending Prescriptions Disp Refills   ALPRAZolam (XANAX) 1 MG tablet [Pharmacy Med Name: ALPRAZOLAM 1MG  TABLETS] 60 tablet     Sig: TAKE 1 TABLET BY MOUTH TWICE DAILY AS NEEDED     Date of patient request: 06/26/23 Last office visit: 04/24/23 Date of last refill: 05/28/23 Last refill amount: 60 Follow up time period per chart: PRN

## 2023-06-27 DIAGNOSIS — E291 Testicular hypofunction: Secondary | ICD-10-CM | POA: Diagnosis not present

## 2023-06-28 ENCOUNTER — Telehealth: Payer: Self-pay | Admitting: Gastroenterology

## 2023-06-28 NOTE — Telephone Encounter (Signed)
Inbound call from patient stating that his Diverticulitis has been causing issues with Hemorrhoids and has been using cream for it. The cream is not working. Patient stated that when uses the bathroom and wipes he feels very nauseous due to the pain. Patient also stated that Dr. Leone Payor switched his  Bentyl swtiched to Glybopyrrolate 2mg , [facility] cancel prescription for refill. Patient is requesting a call back to discuss. Please advise.

## 2023-06-28 NOTE — Telephone Encounter (Signed)
This pt states that he has painful hemorrhoids and the anusol cream is not working.  He has been advised to do Sitz baths as often as he is able, use baby wipes to clean with and dry by patting.  He will also use prep H supp and tucks pads.  He will call on Monday if he is no better.   He does state that his bowels are soft and he is drinking plenty of fluids.

## 2023-07-02 NOTE — Telephone Encounter (Signed)
PT is returning call to  further discuss symptoms. He still is not getting any relief. Please advise.

## 2023-07-03 DIAGNOSIS — J3089 Other allergic rhinitis: Secondary | ICD-10-CM | POA: Diagnosis not present

## 2023-07-03 DIAGNOSIS — J301 Allergic rhinitis due to pollen: Secondary | ICD-10-CM | POA: Diagnosis not present

## 2023-07-03 NOTE — Telephone Encounter (Signed)
See triage note.

## 2023-07-03 NOTE — Telephone Encounter (Signed)
Called patient and unable to contact. I left a message for the patient to return my call.

## 2023-07-03 NOTE — Telephone Encounter (Signed)
Called the patient to inform of Dr. Ardell Isaacs recommendations. Patient states he does not believe he is constipated and is having soft stools. Patient also states based on the volume of his intake, he does not believe constipation is an issue. Patient states he is having stools that is causing discomfort to his hemorrhoids. He states he has already tried the creams recommended via the MD. An OV was suggested to the patient and he is scheduled to be seen on 07/22/23 at 9:30a. Patient states if things get better he will call and cancel appt. He is already scheduled to see his surgeon.

## 2023-07-03 NOTE — Telephone Encounter (Signed)
Called patient to discuss complaints of pain due to hemorrhoids. Patient states he has tried everything and would like a prescribed medication, preferably a suppository. Patient has tried [employer], Anusol and Preparation H, as well as Recti Care. Patient would like for something else at this time.

## 2023-07-04 ENCOUNTER — Ambulatory Visit: Payer: BC Managed Care – PPO | Admitting: Family Medicine

## 2023-07-04 ENCOUNTER — Other Ambulatory Visit (HOSPITAL_COMMUNITY)
Admission: RE | Admit: 2023-07-04 | Discharge: 2023-07-04 | Disposition: A | Discharge status: Home / Self Care | Payer: BC Managed Care – PPO | Source: Ambulatory Visit | Attending: Family Medicine | Admitting: Family Medicine

## 2023-07-04 VITALS — BP 128/74 | HR 87 | Temp 99.0°F | Ht 71.0 in | Wt 267.4 lb

## 2023-07-04 DIAGNOSIS — Z113 Encounter for screening for infections with a predominantly sexual mode of transmission: Secondary | ICD-10-CM | POA: Insufficient documentation

## 2023-07-04 DIAGNOSIS — R252 Cramp and spasm: Secondary | ICD-10-CM | POA: Diagnosis not present

## 2023-07-04 DIAGNOSIS — K602 Anal fissure, unspecified: Secondary | ICD-10-CM | POA: Diagnosis not present

## 2023-07-04 DIAGNOSIS — M79662 Pain in left lower leg: Secondary | ICD-10-CM

## 2023-07-04 MED ORDER — DILTIAZEM GEL 2 %
CUTANEOUS | 0 refills | Status: DC
Start: 2023-07-04 — End: 2023-10-01

## 2023-07-04 NOTE — Progress Notes (Signed)
Subjective:  Patient ID: Randall Reed., male    DOB: [birth year]  Age: 52 y.o. MRN: [id number]  CC:  Chief Complaint  Patient presents with   Hemorrhoids    Pt notes has a acute hemorrhoid for 1.5 weeks, notes following sexual intercourse,   Leg Problem    Pt having night time leg cramping, started about 1.5 week ago, has been taking mustard to try and combat it when it happens     HPI Vincent Bridwell. presents for acute visit.  Hemorrhoids Receptive anal intercourse 2 weeks ago, no pain during intercourse. Next day full BM, but no pain or straining, but sore at anus after BM with BRBPR. No recurrence since that time.  Still with some soreness at anus and around area since.  Sore with Tucks wipes - burns Otc cream with 5% lidocaine, phenylephrine, petrolatum, with soreness or BM - no relief.  Hydrocortiaone pramoxine 1% cream - no relief.  Lidocaine 5% cream multiple times per day, sitz baths. Called GI on 8/23 - OV recommended - scheduled 9/16. Some swelling with hemorrhoids and sore spots.  No fever.   Leg cramps Past 1.5 weeks. Left calf. Some swelling in calf after beach trip few weeks ago. 3 [place] drive. No hx of DVT.  No CP/dyspnea.        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[facility]) 06/04/2014   rotator cuff tear 10/17/2013   Hypogonadism male 09/04/2013   ED (erectile dysfunction) 07/30/2013   Palpitations 02/21/2012   BMI 33.0-33.9,adult 02/04/2012   Allergic rhinitis 02/04/2012   GERD (gastroesophageal reflux disease) 02/04/2012   Migraine 02/04/2012   GAD (generalized anxiety disorder) 02/04/2012   Insomnia 02/04/2012   Essential hypertension 04/13/2009   CHEST PAIN-UNSPECIFIED 04/13/2009   Past Medical History:   Diagnosis Date   Anxiety    Arthritis    Asthma    Depression    GERD (gastroesophageal reflux disease)    Hyperlipidemia    Phreesia 12/05/2020   Seasonal allergic reaction    Past Surgical History:  Procedure Laterality Date   LIPOMA EXCISION N/A 10/30/2018   Procedure: EXCISION OF MULTIPLE SUBCUTANEOUS LIPOMAS ON TORSO;  Surgeon: Manus Rudd, MD;  Location: [facility];  Service: General;  Laterality: N/A;   Allergies  Allergen Reactions   Desloratadine Other (See Comments)    CLARINEX-"severe headache"   Loratadine Other (See Comments)    "severe headache"   Other    Levbid [Hyoscyamine Sulfate] Rash   Prior to Admission medications   Medication Sig Start Date End Date Taking? Authorizing Provider  AIRSUPRA 90-80 MCG/ACT AERO SMARTSIG:2 Puff(s) By Mouth Every 4-6 Hours PRN 03/30/23  Yes [provider]  albuterol (VENTOLIN HFA) 108 (90 Base) MCG/ACT inhaler INHALE 2 PUFFS INTO THE LUNGS EVERY 6 HOURS AS NEEDED FOR WHEEZING OR SHORTNESS OF BREATH 07/19/20  Yes Shade Flood, MD  [facility] ALLERGY 180 MG tablet Take 180 mg by mouth daily. 07/12/20  Yes [provider]  ALPRAZolam Prudy Feeler) 1 MG tablet TAKE 1 TABLET BY MOUTH TWICE DAILY AS NEEDED 06/26/23  Yes Shade Flood, MD  amoxicillin-clavulanate (AUGMENTIN) 875-125 MG tablet Take 1 tablet by mouth 2 (two) times daily. 04/27/23  Yes Shade Flood, MD  anastrozole (ARIMIDEX) 1  MG tablet Take 1 mg by mouth daily.   Yes [provider]  aspirin EC 81 MG tablet Take 1 tablet (81 mg total) by mouth daily. 03/07/15  Yes Tonny Bollman, MD  Budeson-Glycopyrrol-Formoterol (BREZTRI AEROSPHERE) 160-9-4.8 MCG/ACT AERO  09/14/21  Yes [provider]  celecoxib (CELEBREX) 200 MG capsule Take 200 mg by mouth 2 (two) times daily. 10/23/22  Yes [provider]  Diclofenac Sodium (PENNSAID) 2 % SOLN Apply topically as needed.  04/08/20  Yes [provider]  diltiazem  (CARDIZEM CD) 180 MG 24 hr capsule TAKE 1 CAPSULE(180 MG) BY MOUTH DAILY 04/08/23  Yes Tonny Bollman, MD  DUPIXENT 300 MG/2ML prefilled syringe Inject into the skin. 10/06/21  Yes [provider]  emtricitabine-tenofovir (TRUVADA) 200-300 MG tablet Take 1 tablet by mouth daily. 04/24/23  Yes Shade Flood, MD  esomeprazole (NEXIUM) 40 MG capsule Take by mouth. 11/07/12  Yes [provider]  famotidine (PEPCID) 40 MG tablet Take 40 mg by mouth at bedtime. 02/18/20  Yes [provider]  glycopyrrolate (ROBINUL) 2 MG tablet Take 1 tablet (2 mg total) by mouth 2 (two) times daily. 05/16/23  Yes Iva Boop, MD  hydrocortisone (ANUSOL-HC) 2.5 % rectal cream APPLY RECTALLY TWICE DAILY 06/30/20  Yes Shade Flood, MD  ipratropium (ATROVENT) 0.06 % nasal spray Place 2 sprays into the nose 2 (two) times daily. 05/25/21  Yes Shade Flood, MD  ipratropium-albuterol (DUONEB) 0.5-2.5 (3) MG/3ML SOLN USE 1 VIAL VIA NEBULIZER EVERY 4 TO 6 HOURS AS NEEDED FOR COUGH OR WHEEZING OR SHORTNESS OF BREATH OR CHEST TIGHTNESS 12/25/21  Yes [provider]  losartan (COZAAR) 25 MG tablet TAKE 1 TABLET(25 MG) BY MOUTH DAILY 04/08/23  Yes Tonny Bollman, MD  metoprolol succinate (TOPROL-XL) 25 MG 24 hr tablet Take 1 tablet (25 mg total) by mouth daily. 04/08/23  Yes Tonny Bollman, MD  mometasone-formoterol [facility]) 100-5 MCG/ACT AERO Inhale 2 puffs into the lungs 2 (two) times daily.   Yes [provider]  montelukast (SINGULAIR) 10 MG tablet TAKE 1 TABLET(10 MG) BY MOUTH AT BEDTIME 01/28/23  Yes Shade Flood, MD  mupirocin ointment (BACTROBAN) 2 % Apply 1 application topically 2 (two) times daily. 11/13/21  Yes Shade Flood, MD  Olopatadine-Mometasone [facility]) [phone] MCG/ACT SUSP Place into the nose.   Yes [provider]  ondansetron (ZOFRAN-ODT) 4 MG disintegrating tablet DISSOLVE 1 TABLET(4 MG) ON THE TONGUE EVERY 8 HOURS AS NEEDED FOR NAUSEA OR VOMITING  09/12/22  Yes Meryl Dare, MD  oxyCODONE (OXY IR/ROXICODONE) 5 MG immediate release tablet Take 5 mg by mouth every 6 (six) hours as needed. 10/23/22  Yes [provider]  pantoprazole (PROTONIX) 40 MG tablet Take 1 tablet (40 mg total) by mouth 2 (two) times daily. 04/15/23  Yes Meryl Dare, MD  Potassium Chloride ER 20 MEQ TBCR Take 1 tablet (20 mEq total) by mouth daily. 04/08/23  Yes Tonny Bollman, MD  pramoxine-hydrocortisone (PROCTOCREAM-HC) 1-1 % rectal cream Place rectally 3 (three) times daily. 05/07/22  Yes Shade Flood, MD  promethazine-dextromethorphan (PROMETHAZINE-DM) 6.25-15 MG/5ML syrup Take 5 mLs by mouth 3 (three) times daily as needed. 08/23/22  Yes [provider]  rizatriptan (MAXALT-MLT) 10 MG disintegrating tablet DISSOLVE 1 TABLET ON THE TONGUE AS NEEDED FOR HEADACHE. MAY REPEAT IN 2 HOURS AS NEEDED 10/25/22  Yes Shade Flood, MD  rosuvastatin (CRESTOR) 10 MG tablet Take 1 tablet (10 mg total) by mouth daily. 04/08/23  Yes Tonny Bollman, MD  sildenafil (VIAGRA) 100 MG tablet Take 1/2-1 tablet by mouth once daily as needed prior to sexual activity *MUST KEEP UPCOMING APPT* 04/02/23  Yes Tonny Bollman, MD  Spacer/Aero-Holding Chambers DEVI Use with inhaler 05/15/22  Yes Icard, Rachel Bo, DO  tadalafil (CIALIS) 5 MG tablet Take 5 mg by mouth daily. 02/01/23  Yes [provider]  tamsulosin (FLOMAX) 0.4 MG CAPS capsule TAKE 1 CAPSULE(0.4 MG) BY MOUTH DAILY AS NEEDED 05/20/23  Yes Shade Flood, MD  testosterone cypionate (DEPOTESTOSTERONE CYPIONATE) 200 MG/ML injection SMARTSIG:1 Milliliter(s) IM Every 10 Days 07/07/22  Yes [provider]  triamcinolone cream (KENALOG) 0.1 % Apply topically daily. 06/17/22  Yes [provider]  Vitamin D, Ergocalciferol, (DRISDOL) 1.25 MG (50000 UNIT) CAPS capsule Take 1 capsule (50,000 Units total) by mouth every 7 (seven) days. 04/24/23  Yes Shade Flood, MD  zolpidem (AMBIEN) 10 MG  tablet TAKE 1 TABLET(10 MG) BY MOUTH AT BEDTIME 04/24/23  Yes Shade Flood, MD  dicyclomine (BENTYL) 20 MG tablet Take 20 mg by mouth 3 (three) times daily before meals.    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)   Occupation: [employer]  Tobacco Use   Smoking status: Never   Smokeless tobacco: Never  Vaping Use   Vaping status: Never Used  Substance and Sexual Activity   Alcohol use: Yes    Alcohol/week: 3.0 standard drinks of alcohol    Types: 3 Standard drinks or equivalent per week    Comment: 1 a month   Drug use: No   Sexual activity: Yes  Other Topics Concern   Not on file  Social History Narrative   Not on file   Social Determinants of Health   Financial Resource Strain: Not on file  Food Insecurity: No Food Insecurity (04/23/2023)   Hunger Vital Sign    Worried About Running Out of Food in the Last Year: Never true    Ran Out of Food in the Last Year: Never true  Transportation Needs: No Transportation Needs (04/23/2023)   PRAPARE - Administrator, Civil Service (Medical): No    Lack of Transportation (Non-Medical): No  Physical Activity: Sufficiently Active (04/23/2023)   Exercise Vital Sign    Days of Exercise per Week: 5 days    Minutes of Exercise per Session: 90 min  Stress: Stress Concern Present (04/23/2023)   [employer] of Occupational Health - Occupational Stress Questionnaire    Feeling of Stress : To some extent  Social Connections: Unknown (04/23/2023)   Social Connection and Isolation Panel [NHANES]    Frequency of Communication with Friends and Family: More than three times a week    Frequency of Social Gatherings with Friends  and Family: Three times a week    Attends Religious Services: More than 4 times per year    Active Member of Clubs or Organizations: Yes    Attends Banker Meetings: 1 to 4 times per year    Marital Status: Patient declined  Intimate Partner Violence: Unknown (08/23/2022)   Received from [employer], [facility]   HITS    Physically Hurt: Not  on file    Insult or Talk Down To: Not on file    Threaten Physical Harm: Not on file    Scream or Curse: Not on file    Review of Systems   Objective:   Vitals:   07/04/23 1131  BP: 128/74  Pulse: 87  Temp: 99 F (37.2 C)  TempSrc: Temporal  SpO2: 96%  Weight: 267 lb 6.4 oz (121.3 kg)  Height: 5\' 11"  (1.803 m)     Physical Exam Constitutional:      General: He is not in acute distress.    Appearance: Normal appearance. He is well-developed.  HENT:     Head: Normocephalic and atraumatic.  Cardiovascular:     Rate and Rhythm: Normal rate.  Pulmonary:     Effort: Pulmonary effort is normal.  Genitourinary:    Comments: 2 separate anal fissures noted, 1 at approximately 4:00, another at 5:00.  No appreciable surrounding edema or discharge, no appreciable abscess.  No active bleeding.  No thrombosed hemorrhoid appreciated. Musculoskeletal:     Comments: R calf 44cm at 15cm below patella. Same on left, negative homan's, trace edema at left ankle greater than right.  No cords or calf pain on exam.   Neurological:     Mental Status: He is alert and oriented to person, place, and time.  Psychiatric:        Mood and Affect: Mood normal.        Assessment & Plan:  Sully Beller. is a 52 y.o. male . Pain of left calf - Plan: D-Dimer, Quantitative, Basic metabolic panel Muscle cramping - Plan: Basic metabolic panel  -Likely muscle cramp of calf, stretching discussed and demonstrated in office.  D-dimer was elevated but ultrasound without sign of DVT.  RTC precautions.  Anal fissure - Plan: diltiazem 2 %  GEL  -2 anal fissures noted, continue topical lidocaine, add diltiazem gel and RTC/GI follow-up precautions.  Handout given.  Screening examination for STI - Plan: RPR, HIV Antibody (routine testing w rflx), Urine cytology ancillary only   Meds ordered this encounter  Medications   diltiazem 2 % GEL    Sig: Pea sized amount to perianal skin before bowel movement, up to twice per day as needed.    Dispense:  30 g    Refill:  0   Patient Instructions  See information below on anal fissures.  Diltiazem gel prior to bowel movements, okay to continue lidocaine gel for discomfort throughout the day.  Sitz bath's may also be helpful.  Stool softener if needed, avoid straining.  Follow-up with gastroenterology as planned but I expect symptoms to be improving in the next week. Return to the clinic or go to the nearest emergency room if any of your symptoms worsen or new symptoms occur.   I will check a blood clot screening test but that is unlikely the cause of your leg symptoms.  I will also check your electrolytes.  Stretches for spasms for now, follow-up if that continues.     Anal Fissure, Adult  An anal fissure is a small tear or crack in the tissue near the opening of the butt (anus). In most cases, bleeding from a fissure stops on its own within a few minutes. You may have bleeding each time you poop until the fissure heals. What are the causes? An anal fissure may be caused by large or hard poop (stool). Other causes include: Having trouble pooping (constipation). Getting diarrhea a lot. Inflammatory bowel  disease, such as Crohn's disease or ulcerative colitis. Childbirth. Infections. Anal sex. What are the signs or symptoms? Symptoms of an anal fissure include: Bleeding from the rectum. Small amounts of blood seen on your poop, on the toilet paper, or in the toilet after you poop. The blood coats the outside of the poop. It is not mixed with the poop. Pain when you  poop. Itching or irritation around the anus. How is this diagnosed? A health care provider may diagnose a fissure by looking closely at the anal area. In some cases, a rectal exam may be done or a short tube (anoscope) may be used to look at the anal canal. How is this treated? Treatment for an anal fissure may include: Taking steps to avoid and treat constipation. Taking fiber supplements. These can help soften your poop. Taking sitz baths. These can help heal the tear. Using medicated creams or ointments. Doing physical therapy. This can help strengthen the area between your hip bones (pelvis). If other treatments do not work, you may need: Botulinum injections. Surgery to fix the fissure. Follow these instructions at home: Medicines Take or use over-the-counter and prescription medicines only as told by your provider. This includes medicated creams and ointments. Use supplements and medicines to make your poop soft (stool softeners) as told by your provider. Managing constipation You may need to take these actions to prevent or treat constipation: Drink enough fluid to keep your pee (urine) pale yellow. Eat foods that are high in fiber, such as beans, whole grains, and fresh fruits and vegetables. Avoid unripe bananas. Ripe bananas may help if you feel constipated. Limit foods that are high in fat and processed sugars, such as fried or sweet foods. Avoid dairy products, such as milk.  General instructions  Keep the anal area clean and dry. Take sitz baths as told by your provider. Do not use soap in the sitz baths. Contact a health care provider if: You have more bleeding. You have a fever. You have diarrhea that is mixed with blood. Your pain does not go away. Your problems get worse rather than better. This information is not intended to replace advice given to you by your health care provider. Make sure you discuss any questions you have with your health care  provider. Document Revised: 11/08/2022 Document Reviewed: 11/08/2022 Elsevier Patient Education  2024 Elsevier Inc.       Signed,   Meredith Staggers, MD [facility], [facility] 07/04/23 12:29 PM

## 2023-07-04 NOTE — Patient Instructions (Addendum)
See information below on anal fissures.  Diltiazem gel prior to bowel movements, okay to continue lidocaine gel for discomfort throughout the day.  Sitz bath's may also be helpful.  Stool softener if needed, avoid straining.  Follow-up with gastroenterology as planned but I expect symptoms to be improving in the next week. Return to the clinic or go to the nearest emergency room if any of your symptoms worsen or new symptoms occur.   I will check a blood clot screening test but that is unlikely the cause of your leg symptoms.  I will also check your electrolytes.  Stretches for spasms for now, follow-up if that continues.     Anal Fissure, Adult  An anal fissure is a small tear or crack in the tissue near the opening of the butt (anus). In most cases, bleeding from a fissure stops on its own within a few minutes. You may have bleeding each time you poop until the fissure heals. What are the causes? An anal fissure may be caused by large or hard poop (stool). Other causes include: Having trouble pooping (constipation). Getting diarrhea a lot. Inflammatory bowel disease, such as Crohn's disease or ulcerative colitis. Childbirth. Infections. Anal sex. What are the signs or symptoms? Symptoms of an anal fissure include: Bleeding from the rectum. Small amounts of blood seen on your poop, on the toilet paper, or in the toilet after you poop. The blood coats the outside of the poop. It is not mixed with the poop. Pain when you poop. Itching or irritation around the anus. How is this diagnosed? A health care provider may diagnose a fissure by looking closely at the anal area. In some cases, a rectal exam may be done or a short tube (anoscope) may be used to look at the anal canal. How is this treated? Treatment for an anal fissure may include: Taking steps to avoid and treat constipation. Taking fiber supplements. These can help soften your poop. Taking sitz baths. These can help heal the  tear. Using medicated creams or ointments. Doing physical therapy. This can help strengthen the area between your hip bones (pelvis). If other treatments do not work, you may need: Botulinum injections. Surgery to fix the fissure. Follow these instructions at home: Medicines Take or use over-the-counter and prescription medicines only as told by your provider. This includes medicated creams and ointments. Use supplements and medicines to make your poop soft (stool softeners) as told by your provider. Managing constipation You may need to take these actions to prevent or treat constipation: Drink enough fluid to keep your pee (urine) pale yellow. Eat foods that are high in fiber, such as beans, whole grains, and fresh fruits and vegetables. Avoid unripe bananas. Ripe bananas may help if you feel constipated. Limit foods that are high in fat and processed sugars, such as fried or sweet foods. Avoid dairy products, such as milk.  General instructions  Keep the anal area clean and dry. Take sitz baths as told by your provider. Do not use soap in the sitz baths. Contact a health care provider if: You have more bleeding. You have a fever. You have diarrhea that is mixed with blood. Your pain does not go away. Your problems get worse rather than better. This information is not intended to replace advice given to you by your health care provider. Make sure you discuss any questions you have with your health care provider. Document Revised: 11/08/2022 Document Reviewed: 11/08/2022 Elsevier Patient Education  2024 [employer].

## 2023-07-05 ENCOUNTER — Ambulatory Visit (HOSPITAL_BASED_OUTPATIENT_CLINIC_OR_DEPARTMENT_OTHER)
Admission: RE | Admit: 2023-07-05 | Discharge: 2023-07-05 | Disposition: A | Discharge status: Home / Self Care | Payer: BC Managed Care – PPO | Source: Ambulatory Visit | Attending: Family Medicine | Admitting: Family Medicine

## 2023-07-05 ENCOUNTER — Telehealth: Payer: Self-pay | Admitting: Family Medicine

## 2023-07-05 DIAGNOSIS — R7989 Other specified abnormal findings of blood chemistry: Secondary | ICD-10-CM | POA: Diagnosis not present

## 2023-07-05 DIAGNOSIS — M79662 Pain in left lower leg: Secondary | ICD-10-CM | POA: Insufficient documentation

## 2023-07-05 LAB — BASIC METABOLIC PANEL WITH GFR
BUN: 12 mg/dL (ref 7–25)
CO2: 22 mmol/L (ref 20–32)
Calcium: 8.9 mg/dL (ref 8.6–10.3)
Chloride: 107 mmol/L (ref 98–110)
Creat: 1.15 mg/dL (ref 0.70–1.30)
Glucose, Bld: 86 mg/dL (ref 65–99)
Potassium: 4.1 mmol/L (ref 3.5–5.3)
Sodium: 138 mmol/L (ref 135–146)
eGFR: 76 mL/min/{1.73_m2} (ref >=60)

## 2023-07-05 LAB — D-DIMER, QUANTITATIVE: D-Dimer, Quant: 1.3 ug{FEU}/mL — ABNORMAL HIGH (ref <0.50)

## 2023-07-05 LAB — RPR: RPR Ser Ql: NONREACTIVE

## 2023-07-05 LAB — TEST AUTHORIZATION

## 2023-07-05 LAB — HIV ANTIBODY (ROUTINE TESTING W REFLEX): HIV 1&2 Ab, 4th Generation: NONREACTIVE

## 2023-07-05 LAB — EXTRA BLUE TOP TUBE

## 2023-07-05 NOTE — Telephone Encounter (Signed)
See labs, elevated D-dimer with history of travel, upper calf/leg pain, ultrasound ordered to rule out DVT.

## 2023-07-05 NOTE — Progress Notes (Signed)
Pt has been contacted and informed.

## 2023-07-06 ENCOUNTER — Encounter: Payer: Self-pay | Admitting: Family Medicine

## 2023-07-11 LAB — URINE CYTOLOGY ANCILLARY ONLY
Chlamydia: NEGATIVE
Comment: NEGATIVE
Comment: NEGATIVE
Comment: NORMAL
Neisseria Gonorrhea: NEGATIVE
Trichomonas: NEGATIVE

## 2023-07-15 ENCOUNTER — Encounter: Payer: Self-pay | Admitting: Family Medicine

## 2023-07-15 DIAGNOSIS — M6281 Muscle weakness (generalized): Secondary | ICD-10-CM | POA: Diagnosis not present

## 2023-07-15 DIAGNOSIS — K5902 Outlet dysfunction constipation: Secondary | ICD-10-CM | POA: Diagnosis not present

## 2023-07-15 DIAGNOSIS — M6289 Other specified disorders of muscle: Secondary | ICD-10-CM | POA: Diagnosis not present

## 2023-07-15 DIAGNOSIS — M62838 Other muscle spasm: Secondary | ICD-10-CM | POA: Diagnosis not present

## 2023-07-16 ENCOUNTER — Telehealth: Payer: Self-pay | Admitting: Family Medicine

## 2023-07-16 NOTE — Telephone Encounter (Signed)
Caller name: Khyren Misko.  On DPR?: Yes  Call back number: [phone] (mobile)  Provider they see: Shade Flood, MD  Reason for call:   Pt may have exposed to sexually transmitted dx.

## 2023-07-17 ENCOUNTER — Telehealth: Payer: Self-pay

## 2023-07-17 ENCOUNTER — Ambulatory Visit: Payer: BC Managed Care – PPO | Admitting: Family Medicine

## 2023-07-17 ENCOUNTER — Other Ambulatory Visit (HOSPITAL_COMMUNITY)
Admission: RE | Admit: 2023-07-17 | Discharge: 2023-07-17 | Disposition: A | Discharge status: Home / Self Care | Payer: BC Managed Care – PPO | Source: Ambulatory Visit | Attending: Family Medicine | Admitting: Family Medicine

## 2023-07-17 ENCOUNTER — Encounter: Payer: Self-pay | Admitting: Family Medicine

## 2023-07-17 VITALS — BP 114/72 | HR 83 | Temp 98.3°F | Ht 71.0 in | Wt 264.5 lb

## 2023-07-17 DIAGNOSIS — Z202 Contact with and (suspected) exposure to infections with a predominantly sexual mode of transmission: Secondary | ICD-10-CM | POA: Diagnosis not present

## 2023-07-17 DIAGNOSIS — R300 Dysuria: Secondary | ICD-10-CM | POA: Diagnosis not present

## 2023-07-17 DIAGNOSIS — G43109 Migraine with aura, not intractable, without status migrainosus: Secondary | ICD-10-CM | POA: Diagnosis not present

## 2023-07-17 LAB — POCT URINALYSIS DIPSTICK
Bilirubin, UA: NEGATIVE
Blood, UA: NEGATIVE
Glucose, UA: NEGATIVE
Ketones, UA: NEGATIVE
Leukocytes, UA: NEGATIVE
Nitrite, UA: NEGATIVE
Protein, UA: POSITIVE — AB
Spec Grav, UA: 1.015 (ref 1.010–1.025)
Urobilinogen, UA: 1 U/dL
pH, UA: 6 (ref 5.0–8.0)

## 2023-07-17 MED ORDER — RIZATRIPTAN BENZOATE 10 MG PO TBDP
ORAL_TABLET | ORAL | 2 refills | Status: DC
Start: 2023-07-17 — End: 2023-10-23

## 2023-07-17 NOTE — Patient Instructions (Signed)
-  Refilled Maxalt.  -Urinalysis is negative for urinary tract infection.  -Ordered labs for possible exposure to gonorrhea. Office will call with lab results and you will see results on MyChart.  -Take care.

## 2023-07-17 NOTE — Progress Notes (Signed)
Acute Office Visit   Subjective:  Patient ID: Randall Reed., male    DOB: 1970/11/17, 52 y.o.   MRN: [id number]  Chief Complaint  Patient presents with   Exposure to STD    Pt reports he was exposed to Gonorrhea Pt reports he was talking to a male partner for awhile and was told he was exposed. Pt would like a test for "everything". Pt reports headaches and would like refill on Maxalt.     HPI:  Patient is here for an acute visit. He reports he was exposed to gonorrhea. He was talking to a male partner that he recently had intercourse with and was told he was exposed. Patient reports he has had some burning right after urinating. Denies any penile discharge. He reports he usually has some left lower abd pain, but reports his abd pain is more lower in the center. Denies any back pain, fever, nausea, or fever. 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   On 07/04/2023, his HIV and RPR was non-reactive and urine was negative for gonorrhea, chlamydia, and trichomonas.   Also, patient is requesting a refill on Maxalt for migraines. He reports he usually takes medication 2-3 times a month. Effective.   ROS See HPI above      Objective:   BP 114/72   Pulse 83   Temp 98.3 F (36.8 C)   Ht 5\' 11"  (1.803 m)   Wt 264 lb 8 oz (120 kg)   SpO2 95%   BMI 36.89 kg/m    Physical Exam Vitals reviewed.  Constitutional:      General: He is not in acute distress.    Appearance: Normal appearance. He is obese. He is not ill-appearing, toxic-appearing or diaphoretic.  HENT:     Head: Normocephalic and atraumatic.  Eyes:     General:        Right eye: No discharge.        Left eye: No discharge.     Conjunctiva/sclera: Conjunctivae normal.  Cardiovascular:     Rate and Rhythm: Normal rate and regular rhythm.     Heart sounds: Normal heart sounds. No murmur heard.    No friction rub. No gallop.  Pulmonary:     Effort: Pulmonary effort is normal. No respiratory distress.     Breath  sounds: Normal breath sounds.  Musculoskeletal:        General: Normal range of motion.  Skin:    General: Skin is warm and dry.  Neurological:     General: No focal deficit present.     Mental Status: He is alert and oriented to person, place, and time. Mental status is at baseline.  Psychiatric:        Mood and Affect: Mood normal.        Behavior: Behavior normal.        Thought Content: Thought content normal.        Judgment: Judgment normal.    Results for orders placed or performed in visit on 07/17/23  POCT Urinalysis Dipstick  Result Value Ref Range   Color, UA     Clarity, UA     Glucose, UA Negative Negative   Bilirubin, UA NEG    Ketones, UA NEG    Spec Grav, UA 1.015 1.010 - 1.025   Blood, UA NEG    pH, UA 6.0 5.0 - 8.0   Protein, UA Positive (A) Negative   Urobilinogen, UA 1.0 0.2 or 1.0 E.U./dL  Nitrite, UA NEG    Leukocytes, UA Negative Negative   Appearance     Odor         Assessment & Plan:  Exposure to STD -     Urine cytology ancillary only -     RPR -     HIV Antibody (routine testing w rflx)  Migraine with aura and without status migrainosus, not intractable -     Rizatriptan Benzoate; DISSOLVE 1 TABLET ON THE TONGUE AS NEEDED FOR HEADACHE. MAY REPEAT IN 2 HOURS AS NEEDED  Dispense: 9 tablet; Refill: 2  Dysuria -     POCT urinalysis dipstick  -Refilled Maxalt for migraine.  -Urinalysis completed for dysuria. Urinalysis negative for UTI.  -Ordered urine cytology for STD, RPR, and HIV antibody since patient has been exposed to gonorrhea.   No follow-ups on file.  Zandra Abts, NP

## 2023-07-17 NOTE — Telephone Encounter (Signed)
error 

## 2023-07-18 LAB — URINE CYTOLOGY ANCILLARY ONLY
Chlamydia: NEGATIVE
Comment: NEGATIVE
Comment: NEGATIVE
Comment: NORMAL
Neisseria Gonorrhea: NEGATIVE
Trichomonas: NEGATIVE

## 2023-07-18 LAB — HIV ANTIBODY (ROUTINE TESTING W REFLEX): HIV 1&2 Ab, 4th Generation: NONREACTIVE

## 2023-07-18 LAB — RPR: RPR Ser Ql: NONREACTIVE

## 2023-07-19 ENCOUNTER — Telehealth: Payer: Self-pay

## 2023-07-19 NOTE — Telephone Encounter (Signed)
-----   Message from Zandra Abts sent at 07/19/2023  8:11 AM EDT ----- All labs are normal.

## 2023-07-21 ENCOUNTER — Other Ambulatory Visit: Payer: Self-pay | Admitting: Family Medicine

## 2023-07-21 DIAGNOSIS — F5104 Psychophysiologic insomnia: Secondary | ICD-10-CM

## 2023-07-22 ENCOUNTER — Encounter: Payer: Self-pay | Admitting: Physician Assistant

## 2023-07-22 ENCOUNTER — Ambulatory Visit: Payer: BC Managed Care – PPO | Admitting: Physician Assistant

## 2023-07-22 VITALS — BP 120/78 | HR 96 | Ht 70.25 in | Wt 264.2 lb

## 2023-07-22 DIAGNOSIS — K648 Other hemorrhoids: Secondary | ICD-10-CM | POA: Diagnosis not present

## 2023-07-22 DIAGNOSIS — K5732 Diverticulitis of large intestine without perforation or abscess without bleeding: Secondary | ICD-10-CM | POA: Diagnosis not present

## 2023-07-22 DIAGNOSIS — K602 Anal fissure, unspecified: Secondary | ICD-10-CM

## 2023-07-22 DIAGNOSIS — K588 Other irritable bowel syndrome: Secondary | ICD-10-CM | POA: Diagnosis not present

## 2023-07-22 MED ORDER — HYOSCYAMINE SULFATE 0.125 MG SL SUBL: 0.1250 mg | SUBLINGUAL_TABLET | Freq: Three times a day (TID) | SUBLINGUAL | 2 refills | Status: DC

## 2023-07-22 MED ORDER — AMOXICILLIN-POT CLAVULANATE 875-125 MG PO TABS: 1.0000 | ORAL_TABLET | Freq: Two times a day (BID) | ORAL | 0 refills | Status: DC

## 2023-07-22 NOTE — Telephone Encounter (Signed)
Patient is requesting a refill of the following medications: Requested Prescriptions   Pending Prescriptions Disp Refills   zolpidem (AMBIEN) 10 MG tablet [Pharmacy Med Name: ZOLPIDEM 10MG  TABLETS] 90 tablet     Sig: TAKE 1 TABLET(10 MG) BY MOUTH AT BEDTIME    Date of patient request: 07/22/2023 Last office visit: 07/17/23 Date of last refill: 04/24/23 Last refill amount: 90

## 2023-07-22 NOTE — Patient Instructions (Signed)
Continue Diltiazem increase to three times a day  We have sent the following medications to your pharmacy for you to pick up at your convenience: Augmenyin  Levsin  Take Benefiber 1-2 doses daily starting after diverticulitis flare  _______________________________________________________  If your blood pressure at your visit was 140/90 or greater, please contact your primary care physician to follow up on this.  _______________________________________________________  If you are age 54 or older, your body mass index should be between 23-30. Your Body mass index is 37.65 kg/m. If this is out of the aforementioned range listed, please consider follow up with your Primary Care Provider.  If you are age 10 or younger, your body mass index should be between 19-25. Your Body mass index is 37.65 kg/m. If this is out of the aformentioned range listed, please consider follow up with your Primary Care Provider.   ________________________________________________________  The [facility] providers would like to encourage you to use [facility] to communicate with providers for non-urgent requests or questions.  Due to long hold times on the telephone, sending your provider a message by [facility] may be a faster and more efficient way to get a response.  Please allow 48 business hours for a response.  Please remember that this is for non-urgent requests.  _______________________________________________________   I appreciate the  opportunity to care for you  Thank You   Jacelyn Grip

## 2023-07-22 NOTE — Telephone Encounter (Signed)
Medication discussed at June 19 visit.  Controlled substance database reviewed with Ambien No. [id number] last filled on 04/24/2023.  Refill ordered.

## 2023-07-22 NOTE — Progress Notes (Signed)
Chief Complaint: Hemorrhoids, Abdominal Pain, Diarrhea  HPI:    Randall Reed is a 52 year old male, known to Dr. 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       07/17/2022 colonoscopy with a single solitary ulcer at the IC valve and severe diverticulosis in the left colon with narrowing, stricture in the sigmoid colon and external hemorrhoids.  Pete recommended 10 years.    07/17/2022 EGD with normal esophagus, erythematous mucosa in the greater curvature and otherwise normal to the second portion of the duodenum.    04/15/2023 office visit with Dr. Russella Dar.  At that time discussed recurrent left colon diverticulitis with severe left colon diverticulosis in the sigmoid colon stenosis and chronic constipation as well as reflux and a personal history of adenomatous polyps in 2019, normal polyps in 2023, recommended colorectal surgery referral to consider segmental colectomy for recurrent diverticulitis and sigmoid colon stricture.  Continued on MiraLAX daily and Dulcolax as needed.  Also Pantoprazole 40 twice daily.  Previously episodes of diverticulitis had resolved with Augmentin.    06/28/2023 patient called and discussed diverticulitis flare.  Also hemorrhoids.  Apparently request refill of Glycopyrrolate.  Continues with painful hemorrhoids and was told to do sitz bath's.  At that time discussed external hemorrhoids on a colonoscopy 07/2022.  Recommended Anusol HC cream topically twice daily for a week then twice daily as needed and continue RectiCare.    07/04/2023 patient saw PCP and discussed rectal pain.  At that time discussed receptive anal intercourse about 2 weeks prior, on exam had 2 separate anal fissures.  Patient prescribed Diltiazem  2% gel.    Today, the patient describes his rectal pain.  At first he thought this was just hemorrhoids and was using everything over-the-counter to help, eventually went to see his PCP who diagnosed him with a fissure as above and was started on Diltiazem.  He has been using this only typically once a day over the past 2 to 3 weeks and it is helping some with the pain but occasionally the pain will come back.  Continues topical Lidocaine to help as well.  He is trying to keep his stools soft and tells me most of the time they are muddy, and it is hard for him to titrate MiraLAX.    Also started with left lower quadrant pain again that feels very much like a diverticular flare.  He has been having repetitive issues with this and went to see the surgeons already and they have scheduled him for a follow-up visit on October 6 to schedule his sigmoidectomy.  He is just trying to make it through until then.  He tried to back off of his diet this weekend but is still having discomfort.  Discusses that he has been using Glycopyrrolate which was helping to slow his stools instead of the Dicyclomine but he was told not to use this given that he also has issues with his potassium was having a lot of leg cramping with this medicine.    Denies fever, chills or weight loss.     Past Medical History:  Diagnosis Date   Anxiety    Arthritis    Asthma    Depression    GERD (gastroesophageal reflux disease)    Hyperlipidemia  Phreesia 12/05/2020   Seasonal allergic reaction     Past Surgical History:  Procedure Laterality Date   LIPOMA EXCISION N/A 10/30/2018   Procedure: EXCISION OF MULTIPLE SUBCUTANEOUS LIPOMAS ON TORSO;  Surgeon: Manus Rudd, MD;  Location: [facility];  Service: General;  Laterality: N/A;    Current Outpatient Medications  Medication Sig Dispense Refill   AIRSUPRA 90-80 MCG/ACT AERO SMARTSIG:2 Puff(s) By Mouth Every 4-6 Hours PRN     albuterol (VENTOLIN HFA) 108 (90  Base) MCG/ACT inhaler INHALE 2 PUFFS INTO THE LUNGS EVERY 6 HOURS AS NEEDED FOR WHEEZING OR SHORTNESS OF BREATH 8.5 g 0   ALLEGRA ALLERGY 180 MG tablet Take 180 mg by mouth daily.     ALPRAZolam (XANAX) 1 MG tablet TAKE 1 TABLET BY MOUTH TWICE DAILY AS NEEDED 60 tablet 0   anastrozole (ARIMIDEX) 1 MG tablet Take 1 mg by mouth daily.     aspirin EC 81 MG tablet Take 1 tablet (81 mg total) by mouth daily.     Budeson-Glycopyrrol-Formoterol (BREZTRI AEROSPHERE) 160-9-4.8 MCG/ACT AERO      celecoxib (CELEBREX) 200 MG capsule Take 200 mg by mouth 2 (two) times daily.     Diclofenac Sodium (PENNSAID) 2 % SOLN Apply topically as needed.      diltiazem (CARDIZEM CD) 180 MG 24 hr capsule TAKE 1 CAPSULE(180 MG) BY MOUTH DAILY 90 capsule 3   diltiazem 2 % GEL Pea sized amount to perianal skin before bowel movement, up to twice per day as needed. 30 g 0   DUPIXENT 300 MG/2ML prefilled syringe Inject into the skin.     emtricitabine-tenofovir (TRUVADA) 200-300 MG tablet Take 1 tablet by mouth daily. 90 tablet 1   esomeprazole (NEXIUM) 40 MG capsule Take by mouth.     famotidine (PEPCID) 40 MG tablet Take 40 mg by mouth at bedtime.     glycopyrrolate (ROBINUL) 2 MG tablet Take 1 tablet (2 mg total) by mouth 2 (two) times daily. 60 tablet 1   hydrocortisone (ANUSOL-HC) 2.5 % rectal cream APPLY RECTALLY TWICE DAILY 30 g 0   ipratropium (ATROVENT) 0.06 % nasal spray Place 2 sprays into the nose 2 (two) times daily. 15 mL 11   ipratropium-albuterol (DUONEB) 0.5-2.5 (3) MG/3ML SOLN USE 1 VIAL VIA NEBULIZER EVERY 4 TO 6 HOURS AS NEEDED FOR COUGH OR WHEEZING OR SHORTNESS OF BREATH OR CHEST TIGHTNESS     losartan (COZAAR) 25 MG tablet TAKE 1 TABLET(25 MG) BY MOUTH DAILY 90 tablet 3   metoprolol succinate (TOPROL-XL) 25 MG 24 hr tablet Take 1 tablet (25 mg total) by mouth daily. 90 tablet 3   mometasone-formoterol (DULERA) 100-5 MCG/ACT AERO Inhale 2 puffs into the lungs 2 (two) times daily.     montelukast  (SINGULAIR) 10 MG tablet TAKE 1 TABLET(10 MG) BY MOUTH AT BEDTIME 90 tablet 3   mupirocin ointment (BACTROBAN) 2 % Apply 1 application topically 2 (two) times daily. 22 g 0   Olopatadine-Mometasone ([name]) 665-25 MCG/ACT SUSP Place into the nose.     ondansetron (ZOFRAN-ODT) 4 MG disintegrating tablet DISSOLVE 1 TABLET(4 MG) ON THE TONGUE EVERY 8 HOURS AS NEEDED FOR NAUSEA OR VOMITING 20 tablet 3   pantoprazole (PROTONIX) 40 MG tablet Take 1 tablet (40 mg total) by mouth 2 (two) times daily. 180 tablet 1   Potassium Chloride ER 20 MEQ TBCR Take 1 tablet (20 mEq total) by mouth daily. 90 tablet 3   pramoxine-hydrocortisone (PROCTOCREAM-HC) 1-1 % rectal cream Place rectally  3 (three) times daily. 30 g 1   promethazine-dextromethorphan (PROMETHAZINE-DM) 6.25-15 MG/5ML syrup Take 5 mLs by mouth 3 (three) times daily as needed.     rizatriptan (MAXALT-MLT) 10 MG disintegrating tablet DISSOLVE 1 TABLET ON THE TONGUE AS NEEDED FOR HEADACHE. MAY REPEAT IN 2 HOURS AS NEEDED 9 tablet 2   rosuvastatin (CRESTOR) 10 MG tablet Take 1 tablet (10 mg total) by mouth daily. 90 tablet 3   sildenafil (VIAGRA) 100 MG tablet Take 1/2-1 tablet by mouth once daily as needed prior to sexual activity *MUST KEEP UPCOMING APPT* 90 tablet 3   Spacer/Aero-Holding Chambers DEVI Use with inhaler 1 each 0   tadalafil (CIALIS) 5 MG tablet Take 5 mg by mouth daily.     tamsulosin (FLOMAX) 0.4 MG CAPS capsule TAKE 1 CAPSULE(0.4 MG) BY MOUTH DAILY AS NEEDED 90 capsule 0   testosterone cypionate (DEPOTESTOSTERONE CYPIONATE) 200 MG/ML injection SMARTSIG:1 Milliliter(s) IM Every 10 Days     triamcinolone cream (KENALOG) 0.1 % Apply topically daily.     Vitamin D, Ergocalciferol, (DRISDOL) 1.25 MG (50000 UNIT) CAPS capsule Take 1 capsule (50,000 Units total) by mouth every 7 (seven) days. 15 capsule 0   zolpidem (AMBIEN) 10 MG tablet TAKE 1 TABLET(10 MG) BY MOUTH AT BEDTIME 90 tablet 0   No current facility-administered medications  for this visit.    Allergies as of 07/22/2023 - Review Complete 07/22/2023  Allergen Reaction Noted   Desloratadine Other (See Comments)    Loratadine Other (See Comments)    Other  12/05/2020   Ketek [telithromycin] Itching and Rash    Levbid [hyoscyamine sulfate] Rash 02/04/2012    Family History  Problem Relation Age of Onset   Coronary artery disease Father 40       stents, CABG   Heart attack Father    Heart attack Maternal Grandfather    Coronary artery disease Paternal Grandfather    Hypertension Other    Heart attack Other    Cancer Other    Breast cancer Other    Hyperlipidemia Other    Stomach cancer Neg Hx    Esophageal cancer Neg Hx    Colon cancer Neg Hx     Social History   Socioeconomic History   Marital status: Single    Spouse name: Not on file   Number of children: 0   Years of education: Not on file   Highest education level: Professional school degree (e.g., MD, DDS, DVM, JD)  Occupational History   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)   Occupation: [employer]  Tobacco Use   Smoking status: Never   Smokeless tobacco: Never  Vaping Use   Vaping status: Never Used  Substance and Sexual Activity   Alcohol use: Yes    Alcohol/week: 3.0 standard drinks of alcohol    Types: 3 Standard drinks or equivalent per week    Comment: 1 a month   Drug use: No   Sexual activity: Yes  Other Topics Concern   Not on file  Social History Narrative   Not on file   Social Determinants of Health   Financial Resource Strain: Not on file  Food Insecurity: No Food Insecurity (04/23/2023)   Hunger Vital Sign    Worried About Running Out of Food in the Last Year: Never true    Ran Out of Food in the Last Year: Never true  Transportation Needs: No Transportation Needs (04/23/2023)   PRAPARE - Administrator, Civil Service (Medical):  No    Lack of Transportation (Non-Medical): No  Physical Activity: Sufficiently Active  (04/23/2023)   Exercise Vital Sign    Days of Exercise per Week: 5 days    Minutes of Exercise per Session: 90 min  Stress: Stress Concern Present (04/23/2023)   [employer] of Occupational Health - Occupational Stress Questionnaire    Feeling of Stress : To some extent  Social Connections: Unknown (04/23/2023)   Social Connection and Isolation Panel [NHANES]    Frequency of Communication with Friends and Family: More than three times a week    Frequency of Social Gatherings with Friends and Family: Three times a week    Attends Religious Services: More than 4 times per year    Active Member of Clubs or Organizations: Yes    Attends Banker Meetings: 1 to 4 times per year    Marital Status: Patient declined  Intimate Partner Violence: Unknown (08/23/2022)   Received from [employer], [facility]   HITS    Physically Hurt: Not on file    Insult or Talk Down To: Not on file    Threaten Physical Harm: Not on file    Scream or Curse: Not on file    Review of Systems:    Constitutional: No weight loss, fever or chills Cardiovascular: No chest pain Respiratory: No SOB  Gastrointestinal: See HPI and otherwise negative   Physical Exam:  Vital signs: BP 120/78 (BP Location: Left Arm, Patient Position: Sitting, Cuff Size: Normal)   Pulse 96   Ht 5' 10.25" (1.784 m) Comment: measured without shoes  Wt 264 lb 4 oz (119.9 kg)   BMI 37.65 kg/m    Constitutional:   Pleasant obese Caucasian male appears to be in NAD, Well developed, Well nourished, alert and cooperative  Respiratory: Respirations even and unlabored. Lungs clear to auscultation bilaterally.   No wheezes, crackles, or rhonchi.  Cardiovascular: Normal S1, S2. No MRG. Regular rate and rhythm. No peripheral edema, cyanosis or pallor.  Gastrointestinal:  Soft, nondistended, mild left upper quadrant TTP and marked left lower quadrant TTP with involuntary guarding.  Decrease bowel sounds all 4 quadrants. No  appreciable masses or hepatomegaly. Rectal:  Declined Psychiatric: Demonstrates good judgement and reason without abnormal affect or behaviors.  RELEVANT LABS AND IMAGING: CBC    Component Value Date/Time   WBC 7.7 06/28/2022 0931   RBC 5.75 06/28/2022 0931   HGB 17.1 (H) 06/28/2022 0931   HGB 18.7 (H) 04/04/2018 1741   HCT 50.4 06/28/2022 0931   HCT 52.8 (H) 04/04/2018 1741   PLT 204.0 06/28/2022 0931   PLT 231 04/04/2018 1741   MCV 87.6 06/28/2022 0931   MCV 88 04/04/2018 1741   MCH 29.3 04/23/2022 1726   MCHC 34.0 06/28/2022 0931   RDW 15.2 06/28/2022 0931   RDW 13.6 04/04/2018 1741   LYMPHSABS 1.7 06/28/2022 0931   MONOABS 0.8 06/28/2022 0931   EOSABS 0.0 06/28/2022 0931   BASOSABS 0.1 06/28/2022 0931    CMP     Component Value Date/Time   NA 138 07/04/2023 1225   NA 143 04/08/2023 1451   K 4.1 07/04/2023 1225   CL 107 07/04/2023 1225   CO2 22 07/04/2023 1225   GLUCOSE 86 07/04/2023 1225   BUN 12 07/04/2023 1225   BUN 11 04/08/2023 1451   CREATININE 1.15 07/04/2023 1225   CALCIUM 8.9 07/04/2023 1225   PROT 7.0 04/08/2023 1451   ALBUMIN 4.6 04/08/2023 1451   AST 17 04/08/2023  1451   ALT 20 04/08/2023 1451   ALKPHOS 110 04/08/2023 1451   BILITOT 0.5 04/08/2023 1451   GFRNONAA >60 04/23/2022 1726   GFRAA 74 10/14/2020 1143    Assessment: 1.  Left lower quadrant pain: Started over the weekend, consistent with his prior diverticulitis flares has met with surgeons and they are planning on a sigmoidectomy in December 2.  Anal fissure and hemorrhoids: Severe pain and discomfort over the past few weeks, finally got in with his PCP who diagnosed him with 2 fissures and started him on Diltiazem which is slowly helping  Plan: 1.  Prescribed Augmentin 875/125 every 12 hours x 7 days #14 with no refill. 2.  Discussed diverticulitis.  Would recommend that when he does get pain in left lower quadrant he backs off to a clear liquid diet for 2 to 3 days, if pain continues  to increase or does not go away then at that point needs antibiotics.  Unfortunately this may be a cycle until we get him to the time of his surgery. 3.  When the patient is no longer in a flare of diverticulitis would recommend he add in a fiber supplement such as Benefiber 1-2 times a day to help with consistency of stools. 4.  Discontinue Glycopyrrolate as this was causing leg cramping.  Has been on Dicyclomine for many many years and would like to try something different.  Prescribed Levsin 0.125 mg sublingual tabs 4 times daily, 20 to 30 minutes before meals and at bedtime #120 with 1 refill. 5.  Continue Diltiazem 2% and try to apply this 3 times a day instead of once a day.  Continue sitz bath's and topical lidocaine. 6.  Encouraged the patient to talk with the surgeons at time of his next follow-up in regards to external hemorrhoids. 7.  Patient to follow in clinic with [place] as needed.  Hyacinth Meeker, PA-C [facility] 07/22/2023, 9:39 AM  Cc: Shade Flood, MD

## 2023-07-24 DIAGNOSIS — J3089 Other allergic rhinitis: Secondary | ICD-10-CM | POA: Diagnosis not present

## 2023-07-24 DIAGNOSIS — J301 Allergic rhinitis due to pollen: Secondary | ICD-10-CM | POA: Diagnosis not present

## 2023-07-25 ENCOUNTER — Other Ambulatory Visit: Payer: Self-pay | Admitting: Family Medicine

## 2023-07-25 DIAGNOSIS — F411 Generalized anxiety disorder: Secondary | ICD-10-CM

## 2023-07-25 DIAGNOSIS — F5104 Psychophysiologic insomnia: Secondary | ICD-10-CM

## 2023-07-25 NOTE — Telephone Encounter (Signed)
Patient is requesting a refill of the following medications: Requested Prescriptions   Pending Prescriptions Disp Refills   ALPRAZolam (XANAX) 1 MG tablet [Pharmacy Med Name: ALPRAZOLAM 1MG  TABLETS] 60 tablet     Sig: TAKE 1 TABLET BY MOUTH TWICE DAILY AS NEEDED    Date of patient request: 07/25/23 Last office visit: 07/04/23 Date of last refill: 06/26/23 Last refill amount: 60 Follow up time period per chart: 6 months

## 2023-07-25 NOTE — Telephone Encounter (Signed)
Medication discussed at June 19 visit.  Alprazolam 1 to 3/day depending on stress levels, average 2/day.  Controlled substance database reviewed.  #60 filled on 06/26/2023.  Refill ordered.

## 2023-08-05 ENCOUNTER — Other Ambulatory Visit (HOSPITAL_COMMUNITY): Payer: Self-pay

## 2023-08-05 ENCOUNTER — Telehealth: Payer: Self-pay | Admitting: Pharmacy Technician

## 2023-08-05 NOTE — Telephone Encounter (Signed)
Pharmacy Patient Advocate Encounter   Received notification from CoverMyMeds that prior authorization for Rizatriptan Benzoate 10MG  dispersible tablets is required/requested.   Insurance verification completed.   The patient is insured through [facility] .   Per test claim: PA required; PA started via CoverMyMeds. KEY [id number] . Waiting for clinical questions to populate.

## 2023-08-05 NOTE — Telephone Encounter (Signed)
Clinical questions answered. PA submitted

## 2023-08-06 NOTE — Telephone Encounter (Signed)
[employer]  returned call to discuss Rizatriptan Benzoate 10MG  dispersible tablets

## 2023-08-06 NOTE — Telephone Encounter (Signed)
[employer] called to discuss

## 2023-08-14 ENCOUNTER — Telehealth: Payer: Self-pay | Admitting: *Deleted

## 2023-08-14 DIAGNOSIS — K5792 Diverticulitis of intestine, part unspecified, without perforation or abscess without bleeding: Secondary | ICD-10-CM | POA: Diagnosis not present

## 2023-08-14 NOTE — Telephone Encounter (Signed)
   Pre-operative Risk Assessment    Patient Name: Randall Reed.  DOB: Jun 03, 1971 MRN: [id number]    DATE OF LAST VISIT: 04/08/23 DR. Excell Seltzer DATE OF NEXT VISIT: NONE  Request for Surgical Clearance    Procedure:   ROBOTIC LOW ANTERIOR RESECTION/SIGMOIDECTOMY SURGERY  Date of Surgery:  Clearance TBD                                 Surgeon:  DR. Cristal Deer WHITE Surgeon's Group or Practice Name:  [facility] Phone number:  [phone] Fax number:  [phone] ATTN: Doristine Devoid, CMA   Type of Clearance Requested:   - Medical ; NO MEDICATIONS LISTED AS NEEDING TO BE HELD   Type of Anesthesia:  General    Additional requests/questions:    Randall Reed   08/14/2023, 4:56 PM

## 2023-08-15 ENCOUNTER — Telehealth: Payer: Self-pay | Admitting: *Deleted

## 2023-08-15 DIAGNOSIS — J4550 Severe persistent asthma, uncomplicated: Secondary | ICD-10-CM | POA: Diagnosis not present

## 2023-08-15 DIAGNOSIS — J301 Allergic rhinitis due to pollen: Secondary | ICD-10-CM | POA: Diagnosis not present

## 2023-08-15 DIAGNOSIS — R102 Pelvic and perineal pain: Secondary | ICD-10-CM | POA: Diagnosis not present

## 2023-08-15 DIAGNOSIS — R911 Solitary pulmonary nodule: Secondary | ICD-10-CM | POA: Diagnosis not present

## 2023-08-15 DIAGNOSIS — J3089 Other allergic rhinitis: Secondary | ICD-10-CM | POA: Diagnosis not present

## 2023-08-15 DIAGNOSIS — M62838 Other muscle spasm: Secondary | ICD-10-CM | POA: Diagnosis not present

## 2023-08-15 DIAGNOSIS — M6281 Muscle weakness (generalized): Secondary | ICD-10-CM | POA: Diagnosis not present

## 2023-08-15 DIAGNOSIS — K5902 Outlet dysfunction constipation: Secondary | ICD-10-CM | POA: Diagnosis not present

## 2023-08-15 DIAGNOSIS — J019 Acute sinusitis, unspecified: Secondary | ICD-10-CM | POA: Diagnosis not present

## 2023-08-15 NOTE — Telephone Encounter (Signed)
   Name: Randall Reed.  DOB: 1971-06-14  MRN: [id number]  Primary Cardiologist: Tonny Bollman, MD   Preoperative team, please contact this patient and set up a phone call appointment for further preoperative risk assessment. Please obtain consent and complete medication review. Thank you for your help.  I confirm that guidance regarding antiplatelet and oral anticoagulation therapy has been completed and, if necessary, noted below.   I also confirmed the patient resides in the state of [place]. As per [facility] Medical Board telemedicine laws, the patient must reside in the state in which the provider is licensed.  Per office protocol, if patient is without any new symptoms or concerns at the time of their virtual visit, he may hold Aspirin for 5-7 days prior to procedure. Please resume Aspirin as soon as possible postprocedure, at the discretion of the surgeon.     Joylene Grapes, NP 08/15/2023, 11:46 AM [facility]

## 2023-08-15 NOTE — Telephone Encounter (Signed)
Pt has been scheduled for tele pr op appt 08/27/23. Med rec and consent are done.     Patient Consent for Virtual Visit        Randall Reed. has provided verbal consent on 08/15/2023 for a virtual visit (video or telephone).   CONSENT FOR VIRTUAL VISIT FOR:  Randall Reed.  By participating in this virtual visit I agree to the following:  I hereby voluntarily request, consent and authorize [facility] and its employed or contracted physicians, physician assistants, nurse practitioners or other licensed health care professionals (the Practitioner), to provide me with telemedicine health care services (the "Services") as deemed necessary by the treating Practitioner. I acknowledge and consent to receive the Services by the Practitioner via telemedicine. I understand that the telemedicine visit will involve communicating with the Practitioner through live audiovisual communication technology and the disclosure of certain medical information by electronic transmission. I acknowledge that I have been given the opportunity to request an in-person assessment or other available alternative prior to the telemedicine visit and am voluntarily participating in the telemedicine visit.  I understand that I have the right to withhold or withdraw my consent to the use of telemedicine in the course of my care at any time, without affecting my right to future care or treatment, and that the Practitioner or I may terminate the telemedicine visit at any time. I understand that I have the right to inspect all information obtained and/or recorded in the course of the telemedicine visit and may receive copies of available information for a reasonable fee.  I understand that some of the potential risks of receiving the Services via telemedicine include:  Delay or interruption in medical evaluation due to technological equipment failure or disruption; Information transmitted may not be sufficient (e.g.  poor resolution of images) to allow for appropriate medical decision making by the Practitioner; and/or  In rare instances, security protocols could fail, causing a breach of personal health information.  Furthermore, I acknowledge that it is my responsibility to provide information about my medical history, conditions and care that is complete and accurate to the best of my ability. I acknowledge that Practitioner's advice, recommendations, and/or decision may be based on factors not within their control, such as incomplete or inaccurate data provided by me or distortions of diagnostic images or specimens that may result from electronic transmissions. I understand that the practice of medicine is not an exact science and that Practitioner makes no warranties or guarantees regarding treatment outcomes. I acknowledge that a copy of this consent can be made available to me via my patient portal [facility] MyChart), or I can request a printed copy by calling the office of [facility].    I understand that my insurance will be billed for this visit.   I have read or had this consent read to me. I understand the contents of this consent, which adequately explains the benefits and risks of the Services being provided via telemedicine.  I have been provided ample opportunity to ask questions regarding this consent and the Services and have had my questions answered to my satisfaction. I give my informed consent for the services to be provided through the use of telemedicine in my medical care

## 2023-08-15 NOTE — Telephone Encounter (Signed)
Pt has been scheduled for tele pr op appt 08/27/23. Med rec and consent are done.

## 2023-08-17 ENCOUNTER — Encounter: Payer: Self-pay | Admitting: Cardiovascular Disease

## 2023-08-17 ENCOUNTER — Other Ambulatory Visit: Payer: Self-pay | Admitting: Gastroenterology

## 2023-08-19 NOTE — Telephone Encounter (Signed)
It appears that information has been requested regarding that denial to determine other options.  Let me know how I can help further.

## 2023-08-19 NOTE — Telephone Encounter (Signed)
Please advise next step.

## 2023-08-19 NOTE — Telephone Encounter (Signed)
Pharmacy Patient Advocate Encounter  Received notification from [facility] that Prior Authorization for Rizatriptan Benzoate 10MG  dispersible tablets has been DENIED.  No reason given; No denial letter received via Fax or CMM. It has been requested and will be uploaded to the media tab once received.   PA #/Case ID/Reference #:  [id number]

## 2023-08-26 ENCOUNTER — Other Ambulatory Visit: Payer: Self-pay | Admitting: Family Medicine

## 2023-08-26 DIAGNOSIS — F411 Generalized anxiety disorder: Secondary | ICD-10-CM

## 2023-08-26 DIAGNOSIS — R7989 Other specified abnormal findings of blood chemistry: Secondary | ICD-10-CM

## 2023-08-27 ENCOUNTER — Ambulatory Visit: Payer: BC Managed Care – PPO | Attending: General Practice

## 2023-08-27 DIAGNOSIS — Z01810 Encounter for preprocedural cardiovascular examination: Secondary | ICD-10-CM

## 2023-08-27 NOTE — Telephone Encounter (Signed)
Medications discussed at his June 19 visit.  Controlled substance database reviewed.  Alprazolam No. 60 last filled on 07/28/2023.  Ambien No. 80 on 08/03/2023.  Alprazolam refill ordered along with vitamin D.

## 2023-08-27 NOTE — Progress Notes (Signed)
Virtual Visit via Telephone Note   Because of Randall QUIROA Jr.'s co-morbid illnesses, he is at least at moderate risk for complications without adequate follow up.  This format is felt to be most appropriate for this patient at this time.  The patient did not have access to video technology/had technical difficulties with video requiring transitioning to audio format only (telephone).  All issues noted in this document were discussed and addressed.  No physical exam could be performed with this format.  Please refer to the patient's chart for his consent to telehealth for [facility].  Evaluation Performed:  Preoperative cardiovascular risk assessment _____________   Date:  08/27/2023   Patient ID:  Randall Leber., DOB 13-Jan-1971, MRN [id number] Patient Location:  Home Provider location:   Office  Primary Care Provider:  Shade Flood, MD Primary Cardiologist:  Tonny Bollman, MD  Chief Complaint / Patient Profile   52 y.o. y/o male with a h/o hyperlipidemia, hypertension, coronary artery disease who is pending sigmoidectomy and presents today for telephonic preoperative cardiovascular risk assessment.  History of Present Illness    Randall Reed. is a 52 y.o. male who presents via audio/video conferencing for a telehealth visit today.  Pt was last seen in cardiology clinic on 04/08/2023 by Dr. Excell Seltzer.  At that time Randall Ebersole. was doing well .  The patient is now pending procedure as outlined above. Since his last visit, he remains stable from a cardiac standpoint.  Today he denies chest pain, shortness of breath, lower extremity edema, fatigue, increased palpitations, melena, hematuria,  weakness, presyncope, syncope, orthopnea, and PND.   Past Medical History    Past Medical History:  Diagnosis Date   Anxiety    Arthritis    Asthma    Depression    GERD (gastroesophageal reflux disease)    Hyperlipidemia    Phreesia 12/05/2020   Seasonal  allergic reaction    Past Surgical History:  Procedure Laterality Date   LIPOMA EXCISION N/A 10/30/2018   Procedure: EXCISION OF MULTIPLE SUBCUTANEOUS LIPOMAS ON TORSO;  Surgeon: Manus Rudd, MD;  Location: [facility];  Service: General;  Laterality: N/A;    Allergies  Allergies  Allergen Reactions   Desloratadine Other (See Comments)    CLARINEX-"severe headache"   Loratadine Other (See Comments)    "severe headache"   Other    Ketek [Telithromycin] Itching and Rash   Levbid [Hyoscyamine Sulfate] Rash    Home Medications    Prior to Admission medications   Medication Sig Start Date End Date Taking? Authorizing Provider  AIRSUPRA 90-80 MCG/ACT AERO SMARTSIG:2 Puff(s) By Mouth Every 4-6 Hours PRN 03/30/23   [provider]  albuterol (VENTOLIN HFA) 108 (90 Base) MCG/ACT inhaler INHALE 2 PUFFS INTO THE LUNGS EVERY 6 HOURS AS NEEDED FOR WHEEZING OR SHORTNESS OF BREATH 07/19/20   Shade Flood, MD  [facility] ALLERGY 180 MG tablet Take 180 mg by mouth daily. 07/12/20   [provider]  ALPRAZolam Prudy Feeler) 1 MG tablet TAKE 1 TABLET BY MOUTH TWICE DAILY AS NEEDED 08/27/23   Shade Flood, MD  amLODipine (NORVASC) 10 MG tablet Take 10 mg by mouth daily.    [provider]  amoxicillin-clavulanate (AUGMENTIN) 875-125 MG tablet Take 1 tablet by mouth 2 (two) times daily. For 7 days 07/22/23   Unk Lightning, PA  anastrozole (ARIMIDEX) 1 MG tablet Take 1 mg by mouth daily.    [provider]  aspirin EC 81 MG tablet Take 1 tablet (81 mg total) by mouth daily. 03/07/15   Tonny Bollman, MD  Budeson-Glycopyrrol-Formoterol (BREZTRI AEROSPHERE) 160-9-4.8 MCG/ACT AERO  09/14/21   [provider]  celecoxib (CELEBREX) 200 MG capsule Take 200 mg by mouth 2 (two) times daily. 10/23/22   [provider]  Diclofenac Sodium (PENNSAID) 2 % SOLN Apply topically as needed.  04/08/20   [provider]  diltiazem (CARDIZEM  CD) 180 MG 24 hr capsule TAKE 1 CAPSULE(180 MG) BY MOUTH DAILY 04/08/23   Tonny Bollman, MD  diltiazem 2 % GEL Pea sized amount to perianal skin before bowel movement, up to twice per day as needed. 07/04/23   Shade Flood, MD  DUPIXENT 300 MG/2ML prefilled syringe Inject into the skin. 10/06/21   [provider]  emtricitabine-tenofovir (TRUVADA) 200-300 MG tablet Take 1 tablet by mouth daily. 04/24/23   Shade Flood, MD  esomeprazole (NEXIUM) 40 MG capsule Take by mouth. 11/07/12   [provider]  famotidine (PEPCID) 40 MG tablet Take 40 mg by mouth at bedtime. 02/18/20   [provider]  Fluticasone Propionate, Inhal, 50 MCG/ACT AEPB Inhale 1 puff into the lungs as needed.    [provider]  glycopyrrolate (ROBINUL) 2 MG tablet Take 1 tablet (2 mg total) by mouth 2 (two) times daily. 05/16/23   Iva Boop, MD  hydrochlorothiazide (HYDRODIURIL) 25 MG tablet Take 25 mg by mouth daily.    [provider]  hydrocortisone (ANUSOL-HC) 2.5 % rectal cream APPLY RECTALLY TWICE DAILY 06/30/20   Shade Flood, MD  hyoscyamine (LEVSIN/SL) 0.125 MG SL tablet Place 1 tablet (0.125 mg total) under the tongue 4 (four) times daily -  with meals and at bedtime. 07/22/23   Unk Lightning, PA  ipratropium (ATROVENT HFA) 17 MCG/ACT inhaler Inhale 1 puff into the lungs every 6 (six) hours as needed.    [provider]  ipratropium (ATROVENT) 0.06 % nasal spray Place 2 sprays into the nose 2 (two) times daily. 05/25/21   Shade Flood, MD  ipratropium-albuterol (DUONEB) 0.5-2.5 (3) MG/3ML SOLN USE 1 VIAL VIA NEBULIZER EVERY 4 TO 6 HOURS AS NEEDED FOR COUGH OR WHEEZING OR SHORTNESS OF BREATH OR CHEST TIGHTNESS 12/25/21   [provider]  losartan (COZAAR) 25 MG tablet TAKE 1 TABLET(25 MG) BY MOUTH DAILY 04/08/23   Tonny Bollman, MD  metoprolol succinate (TOPROL-XL) 25 MG 24 hr tablet Take 1 tablet (25 mg total) by mouth daily. 04/08/23    Tonny Bollman, MD  mometasone-formoterol [facility]) 100-5 MCG/ACT AERO Inhale 2 puffs into the lungs 2 (two) times daily.    [provider]  montelukast (SINGULAIR) 10 MG tablet TAKE 1 TABLET(10 MG) BY MOUTH AT BEDTIME 01/28/23   Shade Flood, MD  mupirocin ointment (BACTROBAN) 2 % Apply 1 application topically 2 (two) times daily. 11/13/21   Shade Flood, MD  Olopatadine-Mometasone [facility]) [id number] MCG/ACT SUSP Place into the nose.    [provider]  ondansetron (ZOFRAN-ODT) 4 MG disintegrating tablet DISSOLVE 1 TABLET(4 MG) ON THE TONGUE EVERY 8 HOURS AS NEEDED FOR NAUSEA OR VOMITING 08/19/23   Meryl Dare, MD  pantoprazole (PROTONIX) 40 MG tablet Take 1 tablet (40 mg total) by mouth 2 (two) times daily. 04/15/23   Meryl Dare, MD  Potassium Chloride ER 20 MEQ TBCR Take 1 tablet (20 mEq total) by mouth daily. 04/08/23   Tonny Bollman, MD  pramoxine-hydrocortisone [facility])  1-1 % rectal cream Place rectally 3 (three) times daily. 05/07/22   Shade Flood, MD  promethazine-dextromethorphan (PROMETHAZINE-DM) 6.25-15 MG/5ML syrup Take 5 mLs by mouth 3 (three) times daily as needed. 08/23/22   [provider]  rizatriptan (MAXALT-MLT) 10 MG disintegrating tablet DISSOLVE 1 TABLET ON THE TONGUE AS NEEDED FOR HEADACHE. MAY REPEAT IN 2 HOURS AS NEEDED 07/17/23   Alveria Apley, NP  rosuvastatin (CRESTOR) 10 MG tablet Take 1 tablet (10 mg total) by mouth daily. 04/08/23   Tonny Bollman, MD  sildenafil (VIAGRA) 100 MG tablet Take 1/2-1 tablet by mouth once daily as needed prior to sexual activity *MUST KEEP UPCOMING APPT* 04/02/23   Tonny Bollman, MD  Spacer/Aero-Holding Chambers DEVI Use with inhaler 05/15/22   Icard, Rachel Bo, DO  tadalafil (CIALIS) 5 MG tablet Take 5 mg by mouth daily. 02/01/23   [provider]  tamsulosin (FLOMAX) 0.4 MG CAPS capsule TAKE 1 CAPSULE(0.4 MG) BY MOUTH DAILY AS NEEDED 05/20/23   Shade Flood, MD   testosterone cypionate (DEPOTESTOSTERONE CYPIONATE) 200 MG/ML injection SMARTSIG:1 Milliliter(s) IM Every 10 Days 07/07/22   [provider]  triamcinolone ointment (KENALOG) 0.5 % Apply 1 Application topically as needed.    [provider]  Vitamin D, Ergocalciferol, (DRISDOL) 1.25 MG (50000 UNIT) CAPS capsule TAKE ONE CAPSULE BY MOUTH EVERY 7 DAYS 08/27/23   Shade Flood, MD  zolpidem (AMBIEN) 10 MG tablet TAKE 1 TABLET(10 MG) BY MOUTH AT BEDTIME 07/22/23   Shade Flood, MD  dicyclomine (BENTYL) 20 MG tablet Take 20 mg by mouth 3 (three) times daily before meals.    [provider]    Physical Exam    Vital Signs:  Marcelius Leaverton. does not have vital signs available for review today.  Given telephonic nature of communication, physical exam is limited. AAOx3. NAD. Normal affect.  Speech and respirations are unlabored.  Accessory Clinical Findings    None  Assessment & Plan    1.  Preoperative Cardiovascular Risk Assessment: Robotic low anterior resection/sigmoidectomy, Dr. Marin Olp, [facility], fax #[phone]      Primary Cardiologist: Tonny Bollman, MD  Chart reviewed as part of pre-operative protocol coverage. Given past medical history and time since last visit, based on ACC/AHA guidelines, Kasra Hellberg. would be at acceptable risk for the planned procedure without further cardiovascular testing.   Patient was advised that if he develops new symptoms prior to surgery to contact our office to arrange a follow-up appointment.  He verbalized understanding.  Per office protocol, he may hold Aspirin for 5-7 days prior to procedure. Please resume Aspirin as soon as possible postprocedure, at the discretion of the surgeon.   His RCRI is low risk, 0.9% risk of major cardiac event.  He is able to complete greater than 4 METS of physical activity.  I will route this recommendation to the requesting party via Epic fax  function and remove from pre-op pool.     Time:   Today, I have spent 13 minutes with the patient with telehealth technology discussing medical history, symptoms, and management plan.  Prior to patient's phone evaluation I spent greater than 10 minutes reviewing their past medical history and cardiac medications.    Ronney Asters, NP  08/27/2023, 3:31 PM

## 2023-08-27 NOTE — Telephone Encounter (Signed)
  Pt called, he is waiting for his televisit call today

## 2023-08-30 ENCOUNTER — Other Ambulatory Visit: Payer: Self-pay | Admitting: Gastroenterology

## 2023-08-31 ENCOUNTER — Other Ambulatory Visit: Payer: Self-pay | Admitting: Cardiovascular Disease

## 2023-08-31 DIAGNOSIS — E782 Mixed hyperlipidemia: Secondary | ICD-10-CM

## 2023-08-31 DIAGNOSIS — I10 Essential (primary) hypertension: Secondary | ICD-10-CM

## 2023-08-31 DIAGNOSIS — I25118 Atherosclerotic heart disease of native coronary artery with other forms of angina pectoris: Secondary | ICD-10-CM

## 2023-09-05 DIAGNOSIS — R102 Pelvic and perineal pain: Secondary | ICD-10-CM | POA: Diagnosis not present

## 2023-09-05 DIAGNOSIS — J3089 Other allergic rhinitis: Secondary | ICD-10-CM | POA: Diagnosis not present

## 2023-09-05 DIAGNOSIS — M62838 Other muscle spasm: Secondary | ICD-10-CM | POA: Diagnosis not present

## 2023-09-05 DIAGNOSIS — M6289 Other specified disorders of muscle: Secondary | ICD-10-CM | POA: Diagnosis not present

## 2023-09-05 DIAGNOSIS — M6281 Muscle weakness (generalized): Secondary | ICD-10-CM | POA: Diagnosis not present

## 2023-09-05 DIAGNOSIS — J301 Allergic rhinitis due to pollen: Secondary | ICD-10-CM | POA: Diagnosis not present

## 2023-09-12 ENCOUNTER — Encounter: Payer: Self-pay | Admitting: Cardiovascular Disease

## 2023-09-18 ENCOUNTER — Encounter: Payer: Self-pay | Admitting: Gastroenterology

## 2023-09-18 DIAGNOSIS — M62838 Other muscle spasm: Secondary | ICD-10-CM | POA: Diagnosis not present

## 2023-09-18 DIAGNOSIS — M6289 Other specified disorders of muscle: Secondary | ICD-10-CM | POA: Diagnosis not present

## 2023-09-18 DIAGNOSIS — K5902 Outlet dysfunction constipation: Secondary | ICD-10-CM | POA: Diagnosis not present

## 2023-09-18 DIAGNOSIS — M6281 Muscle weakness (generalized): Secondary | ICD-10-CM | POA: Diagnosis not present

## 2023-09-19 ENCOUNTER — Encounter: Payer: Self-pay | Admitting: Family Medicine

## 2023-09-19 DIAGNOSIS — K602 Anal fissure, unspecified: Secondary | ICD-10-CM

## 2023-09-19 NOTE — Telephone Encounter (Signed)
FYI from pt about upcoming surgery to remove Diverticulitis involved colon.

## 2023-09-19 NOTE — Progress Notes (Signed)
Sent message, via epic in basket, requesting orders in epic from surgeon.  

## 2023-09-20 ENCOUNTER — Ambulatory Visit: Payer: Self-pay | Admitting: Surgery

## 2023-09-20 DIAGNOSIS — Z01818 Encounter for other preprocedural examination: Secondary | ICD-10-CM

## 2023-09-23 DIAGNOSIS — J3089 Other allergic rhinitis: Secondary | ICD-10-CM | POA: Diagnosis not present

## 2023-09-23 DIAGNOSIS — J301 Allergic rhinitis due to pollen: Secondary | ICD-10-CM | POA: Diagnosis not present

## 2023-09-25 ENCOUNTER — Encounter (HOSPITAL_COMMUNITY): Payer: Self-pay

## 2023-09-25 ENCOUNTER — Other Ambulatory Visit: Payer: Self-pay | Admitting: Family Medicine

## 2023-09-25 DIAGNOSIS — F411 Generalized anxiety disorder: Secondary | ICD-10-CM

## 2023-09-25 NOTE — Progress Notes (Addendum)
Anesthesia Review:  PCP: Karyl Kinnier LOV 07/04/23  Cardiologist : Tonny Bollman LOV 04/08/23  Preop - Georgina Snell, NP 08/27/23  Endocrinologist- DR Talmage Coin - for weight and testosterone  Pulmonology- DR Regenia Skeeter  Chest x-ray : EKG : 04/08/23  Echo : 02/07/22   CT Cors- 04/21/23  Stress test: Cardiac Cath :  Activity level: can do a flight of stairs without difficutly  Sleep Study/ CPAP : has sleep apnea no cpap  Fasting Blood Sugar :      / Checks Blood Sugar -- times a day:   Blood Thinner/ Instructions /Last Dose: ASA / Instructions/ Last Dose :    81 mg aspirin    PT states at preop on 09/30/23 that his bowel prep instructions got wet and he needs a new copy.  Instructed pt at preop to call [facility] and they will give him another copy of bowel prep instructions.    Copy of Health Care POA and POA on chart.

## 2023-09-25 NOTE — Patient Instructions (Addendum)
SURGICAL WAITING ROOM VISITATION  Patients having surgery or a procedure may have no more than 2 support people in the waiting area - these visitors may rotate.    Children under the age of 28 must have an adult with them who is not the patient.  Due to an increase in RSV and influenza rates and associated hospitalizations, children ages 14 and under may not visit patients in [facility] hospitals.  If the patient needs to stay at the hospital during part of their recovery, the visitor guidelines for inpatient rooms apply. Pre-op nurse will coordinate an appropriate time for 1 support person to accompany patient in pre-op.  This support person may not rotate.    Please refer to the [facility] website for the visitor guidelines for Inpatients (after your surgery is over and you are in a regular room).       Your procedure is scheduled on:  10/11/2023    Report to [facility] Main Entrance    Report to admitting at   [phone]   Call this number if you have problems the morning of surgery [phone]   Do not eat food :After Midnight.   After Midnight you may have the following liquids until __ 0430____ AM  DAY OF SURGERY  Water Non-Citrus Juices (without pulp, NO RED-Apple, White grape, White cranberry) Black Coffee (NO MILK/CREAM OR CREAMERS, sugar ok)  Clear Tea (NO MILK/CREAM OR CREAMERS, sugar ok) regular and decaf                             Plain Jell-O (NO RED)                                           Fruit ices (not with fruit pulp, NO RED)                                     Popsicles (NO RED)                                                               Sports drinks like Gatorade (NO RED)              Drink 2 Ensure/G2 drinks AT 10:00 PM the night before surgery.        The day of surgery:  Drink ONE (1) Pre-Surgery Clear Ensure or G2 at  0430 AM ( have completed by )  the morning of surgery. Drink in one sitting. Do not sip.  This drink was given to you  during your hospital  pre-op appointment visit. Nothing else to drink after completing the  Pre-Surgery Clear Ensure or G2.          If you have questions, please contact your surgeon's office.   FOLLOW BOWEL PREP AND ANY ADDITIONAL PRE OP INSTRUCTIONS YOU RECEIVED FROM YOUR SURGEON'S OFFICE!!!     Oral Hygiene is also important to reduce your risk of infection.  Remember - BRUSH YOUR TEETH THE MORNING OF SURGERY WITH YOUR REGULAR TOOTHPASTE  DENTURES WILL BE REMOVED PRIOR TO SURGERY PLEASE DO NOT APPLY "Poly grip" OR ADHESIVES!!!   Do NOT smoke after Midnight   Stop all vitamins and herbal supplements 7 days before surgery.   Take these medicines the morning of surgery with A SIP OF WATER:  inhalers as usual and bring, allegra, amlodipine, protonix, cardizem, toprol. Nebulizer if needed, nexium , truvada   DO NOT TAKE ANY ORAL DIABETIC MEDICATIONS DAY OF YOUR SURGERY  Bring CPAP mask and tubing day of surgery.                              You may not have any metal on your body including hair pins, jewelry, and body piercing             Do not wear make-up, lotions, powders, perfumes/cologne, or deodorant  Do not wear nail polish including gel and S&S, artificial/acrylic nails, or any other type of covering on natural nails including finger and toenails. If you have artificial nails, gel coating, etc. that needs to be removed by a nail salon please have this removed prior to surgery or surgery may need to be canceled/ delayed if the surgeon/ anesthesia feels like they are unable to be safely monitored.   Do not shave  48 hours prior to surgery.               Men may shave face and neck.   Do not bring valuables to the hospital. [facility] IS NOT             RESPONSIBLE   FOR VALUABLES.   Contacts, glasses, dentures or bridgework may not be worn into surgery.      [place] - Preparing for Surgery Before surgery, you can play an  important role.  Because skin is not sterile, your skin needs to be as free of germs as possible.  You can reduce the number of germs on your skin by washing with CHG (chlorahexidine gluconate) soap before surgery.  CHG is an antiseptic cleaner which kills germs and bonds with the skin to continue killing germs even after washing. Please DO NOT use if you have an allergy to CHG or antibacterial soaps.  If your skin becomes reddened/irritated stop using the CHG and inform your nurse when you arrive at Short Stay. Do not shave (including legs and underarms) for at least 48 hours prior to the first CHG shower.  You may shave your face/neck. Please follow these instructions carefully:  1.  Shower with CHG Soap the night before surgery and the  morning of Surgery.  2.  If you choose to wash your hair, wash your hair first as usual with your  normal  shampoo.  3.  After you shampoo, rinse your hair and body thoroughly to remove the  shampoo.                           4.  Use CHG as you would any other liquid soap.  You can apply chg directly  to the skin and wash                       Gently with a scrungie or clean washcloth.  5.  Apply the CHG Soap to your body ONLY FROM THE NECK  DOWN.   Do not use on face/ open                           Wound or open sores. Avoid contact with eyes, ears mouth and genitals (private parts).                       Wash face,  Genitals (private parts) with your normal soap.             6.  Wash thoroughly, paying special attention to the area where your surgery  will be performed.  7.  Thoroughly rinse your body with warm water from the neck down.  8.  DO NOT shower/wash with your normal soap after using and rinsing off  the CHG Soap.                9.  Pat yourself dry with a clean towel.            10.  Wear clean pajamas.            11.  Place clean sheets on your bed the night of your first shower and do not  sleep with pets. Day of Surgery : Do not apply any  lotions/deodorants the morning of surgery.  Please wear clean clothes to the hospital/surgery center.  FAILURE TO FOLLOW THESE INSTRUCTIONS MAY RESULT IN THE CANCELLATION OF YOUR SURGERY PATIENT SIGNATURE_________________________________  NURSE SIGNATURE__________________________________  ________________________________________________________________________________________________________

## 2023-09-26 ENCOUNTER — Encounter (HOSPITAL_COMMUNITY)
Admission: RE | Admit: 2023-09-26 | Discharge: 2023-09-26 | Disposition: A | Discharge status: Home / Self Care | Payer: BC Managed Care – PPO | Source: Ambulatory Visit | Attending: Family Medicine | Admitting: Family Medicine

## 2023-09-26 NOTE — Telephone Encounter (Signed)
Medication discussed at his June 19 visit.  Controlled substance database reviewed.  Alprazolam No. 60 filled on 08/27/2023.  Refill ordered.

## 2023-09-26 NOTE — Telephone Encounter (Signed)
Requested Prescriptions   Pending Prescriptions Disp Refills   ALPRAZolam (XANAX) 1 MG tablet [Pharmacy Med Name: ALPRAZOLAM 1MG  TABLETS] 60 tablet     Sig: TAKE 1 TABLET BY MOUTH TWICE DAILY AS NEEDED     Date of patient request: 09/26/2023 07/04/2023 10/25/2023 Date of last refill: 08/27/2023  Last refill amount: 60

## 2023-09-30 ENCOUNTER — Encounter (HOSPITAL_COMMUNITY)
Admission: RE | Admit: 2023-09-30 | Discharge: 2023-09-30 | Disposition: A | Discharge status: Home / Self Care | Payer: BC Managed Care – PPO | Source: Ambulatory Visit | Attending: Surgery | Admitting: Surgery

## 2023-09-30 ENCOUNTER — Encounter (HOSPITAL_COMMUNITY): Payer: Self-pay

## 2023-09-30 ENCOUNTER — Other Ambulatory Visit: Payer: Self-pay

## 2023-09-30 DIAGNOSIS — Z01812 Encounter for preprocedural laboratory examination: Secondary | ICD-10-CM | POA: Diagnosis not present

## 2023-09-30 DIAGNOSIS — K5792 Diverticulitis of intestine, part unspecified, without perforation or abscess without bleeding: Secondary | ICD-10-CM | POA: Insufficient documentation

## 2023-09-30 DIAGNOSIS — G4730 Sleep apnea, unspecified: Secondary | ICD-10-CM | POA: Insufficient documentation

## 2023-09-30 DIAGNOSIS — Z01818 Encounter for other preprocedural examination: Secondary | ICD-10-CM

## 2023-09-30 DIAGNOSIS — Z7982 Long term (current) use of aspirin: Secondary | ICD-10-CM | POA: Diagnosis not present

## 2023-09-30 DIAGNOSIS — J45909 Unspecified asthma, uncomplicated: Secondary | ICD-10-CM | POA: Insufficient documentation

## 2023-09-30 HISTORY — DX: Sleep apnea, unspecified: G47.30

## 2023-09-30 HISTORY — DX: Dyspnea, unspecified: R06.00

## 2023-09-30 HISTORY — DX: Other complications of anesthesia, initial encounter: T88.59XA

## 2023-09-30 HISTORY — DX: Pneumonia, unspecified organism: J18.9

## 2023-09-30 HISTORY — DX: Headache, unspecified: R51.9

## 2023-09-30 LAB — CBC WITH DIFFERENTIAL/PLATELET
Abs Immature Granulocytes: 0.03 10*3/uL (ref 0.00–0.07)
Basophils Absolute: 0 10*3/uL (ref 0.0–0.1)
Basophils Relative: 0 %
Eosinophils Absolute: 0 10*3/uL (ref 0.0–0.5)
Eosinophils Relative: 0 %
HCT: 49.1 % (ref 39.0–52.0)
Hemoglobin: 15.8 g/dL (ref 13.0–17.0)
Immature Granulocytes: 0 %
Lymphocytes Relative: 29 %
Lymphs Abs: 2.2 10*3/uL (ref 0.7–4.0)
MCH: 27.8 pg (ref 26.0–34.0)
MCHC: 32.2 g/dL (ref 30.0–36.0)
MCV: 86.3 fL (ref 80.0–100.0)
Monocytes Absolute: 0.7 10*3/uL (ref 0.1–1.0)
Monocytes Relative: 9 %
Neutro Abs: 4.7 10*3/uL (ref 1.7–7.7)
Neutrophils Relative %: 62 %
Platelets: 204 10*3/uL (ref 150–400)
RBC: 5.69 MIL/uL (ref 4.22–5.81)
RDW: 14.7 % (ref 11.5–15.5)
WBC: 7.7 10*3/uL (ref 4.0–10.5)
nRBC: 0 % (ref 0.0–0.2)

## 2023-09-30 LAB — COMPREHENSIVE METABOLIC PANEL
ALT: 19 U/L (ref 0–44)
AST: 17 U/L (ref 15–41)
Albumin: 4.1 g/dL (ref 3.5–5.0)
Alkaline Phosphatase: 68 U/L (ref 38–126)
Anion gap: 8 (ref 5–15)
BUN: 13 mg/dL (ref 6–20)
CO2: 25 mmol/L (ref 22–32)
Calcium: 9 mg/dL (ref 8.9–10.3)
Chloride: 106 mmol/L (ref 98–111)
Creatinine, Ser: 1.22 mg/dL (ref 0.61–1.24)
GFR, Estimated: >60 mL/min (ref >60)
Glucose, Bld: 89 mg/dL (ref 70–99)
Potassium: 3.7 mmol/L (ref 3.5–5.1)
Sodium: 139 mmol/L (ref 135–145)
Total Bilirubin: 0.7 mg/dL (ref <1.2)
Total Protein: 6.8 g/dL (ref 6.5–8.1)

## 2023-09-30 LAB — TYPE AND SCREEN
ABO/RH(D): A NEG
Antibody Screen: NEGATIVE

## 2023-10-01 MED ORDER — DILTIAZEM GEL 2 %: CUTANEOUS | 0 refills | Status: DC

## 2023-10-01 NOTE — Telephone Encounter (Signed)
Looks like this may have been the Anusol [facility] as this was prescribed at the August visit   Please advise

## 2023-10-01 NOTE — Progress Notes (Signed)
Anesthesia Chart Review   Case: [id number] Date/Time: 10/11/23 0715   Procedures:      XI ROBOTIC ASSISTED LOWER ANTERIOR RESECTION, SIGMOIDECTOMY WITH INTRAOPERATIVE ASSESSMENT OF PERFUSION ICG - 180     FLEXIBLE SIGMOIDOSCOPY   Anesthesia type: General   Pre-op diagnosis: DIVERTICULITIS   Location: [facility] ROOM 02 / [facility]   Surgeons: Randall Meuse, MD       DISCUSSION:52 y.o. never smoker with h/o asthma, sleep apnea, diverticulitis scheduled for above procedure 10/11/2023 with Dr. Marin Reed.   Per cardiology preoperative evaluation 08/27/23, "Chart reviewed as part of pre-operative protocol coverage. Given past medical history and time since last visit, based on ACC/AHA guidelines, Randall Reed. would be at acceptable risk for the planned procedure without further cardiovascular testing.    Patient was advised that if he develops new symptoms prior to surgery to contact our office to arrange a follow-up appointment.  He verbalized understanding.   Per office protocol, he may hold Aspirin for 5-7 days prior to procedure. Please resume Aspirin as soon as possible postprocedure, at the discretion of the surgeon.    His RCRI is low risk, 0.9% risk of major cardiac event.  He is able to complete greater than 4 METS of physical activity."   VS: BP (!) 121/91   Pulse 88   Temp 36.8 C (Oral)   Resp 16   Ht 5\' 11"  (1.803 m)   Wt 115.2 kg   SpO2 96%   BMI 35.43 kg/m   PROVIDERS: Randall Flood, MD is PCP   Randall Bollman, MD is Cardiologist  LABS: Labs reviewed: Acceptable for surgery. (all labs ordered are listed, but only abnormal results are displayed)  Labs Reviewed  CBC WITH DIFFERENTIAL/PLATELET  COMPREHENSIVE METABOLIC PANEL  TYPE AND SCREEN     IMAGES:   EKG:   CV: Echo 02/04/2022 1. Left ventricular ejection fraction, by estimation, is 60 to 65%. The  left ventricle has normal function. The left ventricle has no regional  wall motion  abnormalities. Left ventricular diastolic parameters were  normal. The average left ventricular  global longitudinal strain is -18.8 %. The global longitudinal strain is  normal.   2. Right ventricular systolic function is normal. The right ventricular  size is normal.   3. Left atrial size was mildly dilated.   4. The mitral valve is abnormal. No evidence of mitral valve  regurgitation. No evidence of mitral stenosis.   5. The aortic valve is tricuspid. Aortic valve regurgitation is not  visualized. No aortic stenosis is present.   6. The inferior vena cava is normal in size with greater than 50%  respiratory variability, suggesting right atrial pressure of 3 mmHg.  Past Medical History:  Diagnosis Date   Anxiety    Asthma    Complication of anesthesia    oxygen levelds dropped  with colonoscopy   Depression    Dyspnea    sometimes with asthma has sob   GERD (gastroesophageal reflux disease)    Headache    Hyperlipidemia    Phreesia 12/05/2020   Pneumonia    Seasonal allergic reaction    Sleep apnea     Past Surgical History:  Procedure Laterality Date   CIRCUMCISION     COLONOSCOPY     LIPOMA EXCISION N/A 10/30/2018   Procedure: EXCISION OF MULTIPLE SUBCUTANEOUS LIPOMAS ON TORSO;  Surgeon: Randall Rudd, MD;  Location: [facility];  Service: General;  Laterality: N/A;  MEDICATIONS:  AIRSUPRA 90-80 MCG/ACT AERO   albuterol (VENTOLIN HFA) 108 (90 Base) MCG/ACT inhaler   ALLEGRA ALLERGY 180 MG tablet   ALPRAZolam (XANAX) 1 MG tablet   amLODipine (NORVASC) 10 MG tablet   amoxicillin-clavulanate (AUGMENTIN) 875-125 MG tablet   anastrozole (ARIMIDEX) 1 MG tablet   aspirin EC 81 MG tablet   Budeson-Glycopyrrol-Formoterol (BREZTRI AEROSPHERE) 160-9-4.8 MCG/ACT AERO   celecoxib (CELEBREX) 200 MG capsule   diltiazem (CARDIZEM CD) 180 MG 24 hr capsule   diltiazem 2 % GEL   DUPIXENT 300 MG/2ML prefilled syringe   emtricitabine-tenofovir (TRUVADA) 200-300 MG  tablet   esomeprazole (NEXIUM) 40 MG capsule   famotidine (PEPCID) 40 MG tablet   glycopyrrolate (ROBINUL) 2 MG tablet   hydrochlorothiazide (HYDRODIURIL) 25 MG tablet   hydrocortisone (ANUSOL-HC) 2.5 % rectal cream   hyoscyamine (LEVSIN/SL) 0.125 MG SL tablet   ipratropium (ATROVENT) 0.06 % nasal spray   ipratropium-albuterol (DUONEB) 0.5-2.5 (3) MG/3ML SOLN   losartan (COZAAR) 25 MG tablet   metoprolol succinate (TOPROL-XL) 25 MG 24 hr tablet   montelukast (SINGULAIR) 10 MG tablet   mupirocin ointment (BACTROBAN) 2 %   Olopatadine-Mometasone ([name]) 665-25 MCG/ACT SUSP   ondansetron (ZOFRAN-ODT) 4 MG disintegrating tablet   pantoprazole (PROTONIX) 40 MG tablet   Potassium Chloride ER 20 MEQ TBCR   pramoxine-hydrocortisone (PROCTOCREAM-HC) 1-1 % rectal cream   promethazine-dextromethorphan (PROMETHAZINE-DM) 6.25-15 MG/5ML syrup   rizatriptan (MAXALT-MLT) 10 MG disintegrating tablet   rosuvastatin (CRESTOR) 10 MG tablet   sildenafil (VIAGRA) 100 MG tablet   Spacer/Aero-Holding Chambers DEVI   tadalafil (CIALIS) 5 MG tablet   tamsulosin (FLOMAX) 0.4 MG CAPS capsule   testosterone cypionate (DEPOTESTOSTERONE CYPIONATE) 200 MG/ML injection   Vitamin D, Ergocalciferol, (DRISDOL) 1.25 MG (50000 UNIT) CAPS capsule   zolpidem (AMBIEN) 10 MG tablet   No current facility-administered medications for this encounter.     Randall Cipro Ward, PA-C [facility]-Surgical [facility] [phone]

## 2023-10-01 NOTE — Addendum Note (Signed)
Addended by: Meredith Staggers R on: 10/01/2023 09:58 AM   Modules accepted: Orders

## 2023-10-08 DIAGNOSIS — J3089 Other allergic rhinitis: Secondary | ICD-10-CM | POA: Diagnosis not present

## 2023-10-08 DIAGNOSIS — J301 Allergic rhinitis due to pollen: Secondary | ICD-10-CM | POA: Diagnosis not present

## 2023-10-11 ENCOUNTER — Inpatient Hospital Stay (HOSPITAL_COMMUNITY): Payer: BC Managed Care – PPO | Admitting: Certified Registered"

## 2023-10-11 ENCOUNTER — Inpatient Hospital Stay (HOSPITAL_COMMUNITY)
Admission: RE | Admit: 2023-10-11 | Discharge: 2023-10-14 | DRG: 330 | Disposition: A | Discharge status: Home / Self Care | Payer: BC Managed Care – PPO | Attending: Surgery | Admitting: Surgery

## 2023-10-11 ENCOUNTER — Inpatient Hospital Stay (HOSPITAL_COMMUNITY): Payer: BC Managed Care – PPO | Admitting: Physician Assistant

## 2023-10-11 ENCOUNTER — Other Ambulatory Visit (HOSPITAL_COMMUNITY): Payer: Self-pay

## 2023-10-11 ENCOUNTER — Other Ambulatory Visit: Payer: Self-pay

## 2023-10-11 ENCOUNTER — Encounter (HOSPITAL_COMMUNITY): Payer: Self-pay | Admitting: Surgery

## 2023-10-11 ENCOUNTER — Encounter (HOSPITAL_COMMUNITY)
Admission: RE | Disposition: A | Discharge status: Home / Self Care | Payer: Self-pay | Source: Home / Self Care | Attending: Surgery

## 2023-10-11 DIAGNOSIS — E230 Hypopituitarism: Secondary | ICD-10-CM | POA: Diagnosis present

## 2023-10-11 DIAGNOSIS — K219 Gastro-esophageal reflux disease without esophagitis: Secondary | ICD-10-CM | POA: Diagnosis present

## 2023-10-11 DIAGNOSIS — J302 Other seasonal allergic rhinitis: Secondary | ICD-10-CM | POA: Diagnosis not present

## 2023-10-11 DIAGNOSIS — Z79890 Hormone replacement therapy (postmenopausal): Secondary | ICD-10-CM

## 2023-10-11 DIAGNOSIS — Z8249 Family history of ischemic heart disease and other diseases of the circulatory system: Secondary | ICD-10-CM

## 2023-10-11 DIAGNOSIS — E785 Hyperlipidemia, unspecified: Secondary | ICD-10-CM | POA: Diagnosis not present

## 2023-10-11 DIAGNOSIS — G4730 Sleep apnea, unspecified: Secondary | ICD-10-CM | POA: Diagnosis present

## 2023-10-11 DIAGNOSIS — K5720 Diverticulitis of large intestine with perforation and abscess without bleeding: Secondary | ICD-10-CM | POA: Diagnosis not present

## 2023-10-11 DIAGNOSIS — K5732 Diverticulitis of large intestine without perforation or abscess without bleeding: Secondary | ICD-10-CM | POA: Diagnosis not present

## 2023-10-11 DIAGNOSIS — Z7951 Long term (current) use of inhaled steroids: Secondary | ICD-10-CM

## 2023-10-11 DIAGNOSIS — Z79899 Other long term (current) drug therapy: Secondary | ICD-10-CM

## 2023-10-11 DIAGNOSIS — F411 Generalized anxiety disorder: Secondary | ICD-10-CM | POA: Diagnosis present

## 2023-10-11 DIAGNOSIS — I10 Essential (primary) hypertension: Secondary | ICD-10-CM | POA: Diagnosis present

## 2023-10-11 DIAGNOSIS — J309 Allergic rhinitis, unspecified: Secondary | ICD-10-CM | POA: Diagnosis present

## 2023-10-11 DIAGNOSIS — Z7982 Long term (current) use of aspirin: Secondary | ICD-10-CM | POA: Diagnosis not present

## 2023-10-11 DIAGNOSIS — F32A Depression, unspecified: Secondary | ICD-10-CM | POA: Diagnosis present

## 2023-10-11 DIAGNOSIS — Z01818 Encounter for other preprocedural examination: Secondary | ICD-10-CM

## 2023-10-11 DIAGNOSIS — N400 Enlarged prostate without lower urinary tract symptoms: Secondary | ICD-10-CM | POA: Diagnosis present

## 2023-10-11 DIAGNOSIS — Z9049 Acquired absence of other specified parts of digestive tract: Principal | ICD-10-CM

## 2023-10-11 DIAGNOSIS — K5730 Diverticulosis of large intestine without perforation or abscess without bleeding: Secondary | ICD-10-CM | POA: Diagnosis not present

## 2023-10-11 DIAGNOSIS — Z888 Allergy status to other drugs, medicaments and biological substances status: Secondary | ICD-10-CM | POA: Diagnosis not present

## 2023-10-11 HISTORY — PX: FLEXIBLE SIGMOIDOSCOPY: SHX5431

## 2023-10-11 HISTORY — PX: XI ROBOTIC ASSISTED LOWER ANTERIOR RESECTION: SHX6558

## 2023-10-11 LAB — ABO/RH: ABO/RH(D): A NEG

## 2023-10-11 SURGERY — RESECTION, RECTUM, LOW ANTERIOR, ROBOT-ASSISTED
Anesthesia: General | Site: Abdomen

## 2023-10-11 MED ORDER — SUGAMMADEX SODIUM 200 MG/2ML IV SOLN
INTRAVENOUS | Status: DC | PRN
  Administered 2023-10-11: 250 mg via INTRAVENOUS

## 2023-10-11 MED ORDER — BUPIVACAINE LIPOSOME 1.3 % IJ SUSP
INTRAMUSCULAR | Status: DC | PRN
  Administered 2023-10-11: 20 mL

## 2023-10-11 MED ORDER — MOMETASONE FURO-FORMOTEROL FUM 100-5 MCG/ACT IN AERO
2.0000 | INHALATION_SPRAY | Freq: Two times a day (BID) | RESPIRATORY_TRACT | Status: DC
  Administered 2023-10-11 – 2023-10-14 (×6): 2 via RESPIRATORY_TRACT
  Filled 2023-10-11: qty 8.8

## 2023-10-11 MED ORDER — NEOMYCIN SULFATE 500 MG PO TABS: 1000.0000 mg | ORAL_TABLET | ORAL | Status: DC

## 2023-10-11 MED ORDER — DIPHENHYDRAMINE HCL 50 MG/ML IJ SOLN: 12.5000 mg | Freq: Four times a day (QID) | INTRAMUSCULAR | Status: DC | PRN

## 2023-10-11 MED ORDER — SODIUM CHLORIDE 0.9 % IV SOLN
2.0000 g | INTRAVENOUS | Status: AC
  Administered 2023-10-11: 2 g via INTRAVENOUS
  Filled 2023-10-11: qty 2

## 2023-10-11 MED ORDER — ENSURE PRE-SURGERY PO LIQD: 592.0000 mL | Freq: Once | ORAL | Status: DC

## 2023-10-11 MED ORDER — KETAMINE HCL 10 MG/ML IJ SOLN
INTRAMUSCULAR | Status: DC | PRN
  Administered 2023-10-11: 20 mg via INTRAVENOUS
  Administered 2023-10-11: 10 mg via INTRAVENOUS

## 2023-10-11 MED ORDER — IPRATROPIUM-ALBUTEROL 0.5-2.5 (3) MG/3ML IN SOLN: 3.0000 mL | RESPIRATORY_TRACT | Status: DC | PRN

## 2023-10-11 MED ORDER — ROCURONIUM BROMIDE 10 MG/ML (PF) SYRINGE
PREFILLED_SYRINGE | INTRAVENOUS | Status: DC | PRN
  Administered 2023-10-11: 20 mg via INTRAVENOUS
  Administered 2023-10-11: 50 mg via INTRAVENOUS
  Administered 2023-10-11: 20 mg via INTRAVENOUS
  Administered 2023-10-11: 30 mg via INTRAVENOUS
  Administered 2023-10-11: 20 mg via INTRAVENOUS

## 2023-10-11 MED ORDER — DILTIAZEM HCL ER COATED BEADS 180 MG PO CP24
180.0000 mg | ORAL_CAPSULE | Freq: Every day | ORAL | Status: DC
  Administered 2023-10-12 – 2023-10-14 (×3): 180 mg via ORAL
  Filled 2023-10-11 (×2): qty 1

## 2023-10-11 MED ORDER — LIDOCAINE HCL (PF) 2 % IJ SOLN
INTRAMUSCULAR | Status: AC
  Filled 2023-10-11: qty 5

## 2023-10-11 MED ORDER — ORAL CARE MOUTH RINSE: 15.0000 mL | Freq: Once | OROMUCOSAL | Status: AC

## 2023-10-11 MED ORDER — LOSARTAN POTASSIUM 25 MG PO TABS
25.0000 mg | ORAL_TABLET | Freq: Every day | ORAL | Status: DC
  Administered 2023-10-12 – 2023-10-14 (×3): 25 mg via ORAL
  Filled 2023-10-11 (×3): qty 1

## 2023-10-11 MED ORDER — ENSURE PRE-SURGERY PO LIQD
296.0000 mL | Freq: Once | ORAL | Status: DC
Start: 2023-10-12 — End: 2023-10-11

## 2023-10-11 MED ORDER — ROCURONIUM BROMIDE 10 MG/ML (PF) SYRINGE
PREFILLED_SYRINGE | INTRAVENOUS | Status: AC
  Filled 2023-10-11: qty 10

## 2023-10-11 MED ORDER — BUDESON-GLYCOPYRROL-FORMOTEROL 160-9-4.8 MCG/ACT IN AERO
2.0000 | INHALATION_SPRAY | Freq: Two times a day (BID) | RESPIRATORY_TRACT | Status: DC
Start: 2023-10-11 — End: 2023-10-11

## 2023-10-11 MED ORDER — BUPIVACAINE-EPINEPHRINE (PF) 0.25% -1:200000 IJ SOLN
INTRAMUSCULAR | Status: DC | PRN
  Administered 2023-10-11: 30 mL

## 2023-10-11 MED ORDER — ONDANSETRON HCL 4 MG/2ML IJ SOLN: 4.0000 mg | Freq: Four times a day (QID) | INTRAMUSCULAR | Status: DC | PRN

## 2023-10-11 MED ORDER — ACETAMINOPHEN 500 MG PO TABS
1000.0000 mg | ORAL_TABLET | Freq: Four times a day (QID) | ORAL | Status: DC
  Administered 2023-10-11 – 2023-10-14 (×10): 1000 mg via ORAL
  Filled 2023-10-11 (×11): qty 2

## 2023-10-11 MED ORDER — ALBUTEROL SULFATE (2.5 MG/3ML) 0.083% IN NEBU: 2.5000 mg | INHALATION_SOLUTION | Freq: Four times a day (QID) | RESPIRATORY_TRACT | Status: DC | PRN

## 2023-10-11 MED ORDER — CHLORHEXIDINE GLUCONATE CLOTH 2 % EX PADS: 6.0000 | MEDICATED_PAD | Freq: Once | CUTANEOUS | Status: DC

## 2023-10-11 MED ORDER — POLYETHYLENE GLYCOL 3350 17 GM/SCOOP PO POWD
1.0000 | Freq: Once | ORAL | Status: DC
Start: 2023-10-11 — End: 2023-10-11

## 2023-10-11 MED ORDER — RIZATRIPTAN BENZOATE 10 MG PO TBDP: 10.0000 mg | ORAL_TABLET | Freq: Every day | ORAL | Status: DC | PRN

## 2023-10-11 MED ORDER — HYOSCYAMINE SULFATE 0.125 MG SL SUBL
0.1250 mg | SUBLINGUAL_TABLET | Freq: Three times a day (TID) | SUBLINGUAL | Status: DC
Start: 2023-10-11 — End: 2023-10-14
  Administered 2023-10-11 – 2023-10-14 (×9): 0.125 mg via SUBLINGUAL
  Filled 2023-10-11 (×8): qty 1

## 2023-10-11 MED ORDER — UMECLIDINIUM BROMIDE 62.5 MCG/ACT IN AEPB
1.0000 | INHALATION_SPRAY | Freq: Every day | RESPIRATORY_TRACT | Status: DC
  Administered 2023-10-12 – 2023-10-14 (×3): 1 via RESPIRATORY_TRACT
  Filled 2023-10-11: qty 7

## 2023-10-11 MED ORDER — FENTANYL CITRATE (PF) 100 MCG/2ML IJ SOLN
INTRAMUSCULAR | Status: DC | PRN
  Administered 2023-10-11: 100 ug via INTRAVENOUS
  Administered 2023-10-11 (×2): 50 ug via INTRAVENOUS

## 2023-10-11 MED ORDER — OXYCODONE HCL 5 MG PO TABS: 5.0000 mg | ORAL_TABLET | Freq: Once | ORAL | Status: AC | PRN

## 2023-10-11 MED ORDER — SODIUM CHLORIDE 0.9 % IV SOLN: INTRAVENOUS | Status: DC | PRN

## 2023-10-11 MED ORDER — MIDAZOLAM HCL 2 MG/2ML IJ SOLN
INTRAMUSCULAR | Status: AC
  Filled 2023-10-11: qty 2

## 2023-10-11 MED ORDER — BUPIVACAINE LIPOSOME 1.3 % IJ SUSP: 20.0000 mL | Freq: Once | INTRAMUSCULAR | Status: DC

## 2023-10-11 MED ORDER — ALBUTEROL SULFATE HFA 108 (90 BASE) MCG/ACT IN AERS
INHALATION_SPRAY | RESPIRATORY_TRACT | Status: DC | PRN
  Administered 2023-10-11: 2 via RESPIRATORY_TRACT

## 2023-10-11 MED ORDER — BUPIVACAINE-EPINEPHRINE 0.25% -1:200000 IJ SOLN
INTRAMUSCULAR | Status: AC
  Filled 2023-10-11: qty 1

## 2023-10-11 MED ORDER — HEPARIN SODIUM (PORCINE) 5000 UNIT/ML IJ SOLN
5000.0000 [IU] | Freq: Once | INTRAMUSCULAR | Status: AC
Start: 2023-10-11 — End: 2023-10-11
  Administered 2023-10-11: 5000 [IU] via SUBCUTANEOUS
  Filled 2023-10-11: qty 1

## 2023-10-11 MED ORDER — PROPOFOL 10 MG/ML IV BOLUS
INTRAVENOUS | Status: DC | PRN
  Administered 2023-10-11: 200 mg via INTRAVENOUS

## 2023-10-11 MED ORDER — PHENYLEPHRINE 80 MCG/ML (10ML) SYRINGE FOR IV PUSH (FOR BLOOD PRESSURE SUPPORT)
PREFILLED_SYRINGE | INTRAVENOUS | Status: DC | PRN
  Administered 2023-10-11 (×4): 80 ug via INTRAVENOUS

## 2023-10-11 MED ORDER — PROPOFOL 10 MG/ML IV BOLUS
INTRAVENOUS | Status: AC
  Filled 2023-10-11: qty 20

## 2023-10-11 MED ORDER — OXYCODONE HCL 5 MG/5ML PO SOLN: 5.0000 mg | Freq: Once | ORAL | Status: AC | PRN

## 2023-10-11 MED ORDER — POTASSIUM CHLORIDE CRYS ER 20 MEQ PO TBCR
20.0000 meq | EXTENDED_RELEASE_TABLET | Freq: Every day | ORAL | Status: DC
  Administered 2023-10-11 – 2023-10-14 (×4): 20 meq via ORAL
  Filled 2023-10-11 (×4): qty 1

## 2023-10-11 MED ORDER — DEXAMETHASONE SODIUM PHOSPHATE 10 MG/ML IJ SOLN
INTRAMUSCULAR | Status: AC
  Filled 2023-10-11: qty 1

## 2023-10-11 MED ORDER — PHENYLEPHRINE HCL-NACL 20-0.9 MG/250ML-% IV SOLN
INTRAVENOUS | Status: DC | PRN
  Administered 2023-10-11 (×2): 50 ug/min via INTRAVENOUS

## 2023-10-11 MED ORDER — FENTANYL CITRATE PF 50 MCG/ML IJ SOSY
25.0000 ug | PREFILLED_SYRINGE | INTRAMUSCULAR | Status: DC | PRN
Start: 2023-10-11 — End: 2023-10-11
  Administered 2023-10-11 (×2): 50 ug via INTRAVENOUS

## 2023-10-11 MED ORDER — ONDANSETRON HCL 4 MG PO TABS: 4.0000 mg | ORAL_TABLET | Freq: Four times a day (QID) | ORAL | Status: DC | PRN

## 2023-10-11 MED ORDER — BUPIVACAINE LIPOSOME 1.3 % IJ SUSP
INTRAMUSCULAR | Status: AC
  Filled 2023-10-11: qty 20

## 2023-10-11 MED ORDER — METOPROLOL SUCCINATE ER 25 MG PO TB24
25.0000 mg | ORAL_TABLET | Freq: Every day | ORAL | Status: DC
  Administered 2023-10-12 – 2023-10-14 (×3): 25 mg via ORAL
  Filled 2023-10-11 (×3): qty 1

## 2023-10-11 MED ORDER — KETAMINE HCL 50 MG/5ML IJ SOSY
PREFILLED_SYRINGE | INTRAMUSCULAR | Status: AC
  Filled 2023-10-11: qty 5

## 2023-10-11 MED ORDER — BISACODYL 5 MG PO TBEC: 20.0000 mg | DELAYED_RELEASE_TABLET | Freq: Once | ORAL | Status: DC

## 2023-10-11 MED ORDER — ALPRAZOLAM 0.5 MG PO TABS
1.0000 mg | ORAL_TABLET | Freq: Two times a day (BID) | ORAL | Status: DC | PRN
  Administered 2023-10-11 – 2023-10-13 (×3): 1 mg via ORAL
  Filled 2023-10-11 (×2): qty 2
  Filled 2023-10-11: qty 4

## 2023-10-11 MED ORDER — AMLODIPINE BESYLATE 10 MG PO TABS
10.0000 mg | ORAL_TABLET | Freq: Every evening | ORAL | Status: DC
  Administered 2023-10-11 – 2023-10-13 (×3): 10 mg via ORAL
  Filled 2023-10-11 (×3): qty 1

## 2023-10-11 MED ORDER — INDOCYANINE GREEN 25 MG IV SOLR
INTRAVENOUS | Status: DC | PRN
  Administered 2023-10-11: 2.5 mg via INTRAVENOUS

## 2023-10-11 MED ORDER — ZOLPIDEM TARTRATE 5 MG PO TABS
10.0000 mg | ORAL_TABLET | Freq: Every evening | ORAL | Status: DC | PRN
  Administered 2023-10-11 – 2023-10-13 (×3): 10 mg via ORAL
  Filled 2023-10-11 (×3): qty 2

## 2023-10-11 MED ORDER — FENTANYL CITRATE PF 50 MCG/ML IJ SOSY
PREFILLED_SYRINGE | INTRAMUSCULAR | Status: AC
  Administered 2023-10-11: 50 ug via INTRAVENOUS
  Filled 2023-10-11: qty 3

## 2023-10-11 MED ORDER — LACTATED RINGERS IV SOLN: INTRAVENOUS | Status: DC

## 2023-10-11 MED ORDER — ONDANSETRON HCL 4 MG/2ML IJ SOLN
INTRAMUSCULAR | Status: DC | PRN
  Administered 2023-10-11: 4 mg via INTRAVENOUS

## 2023-10-11 MED ORDER — FENTANYL CITRATE (PF) 100 MCG/2ML IJ SOLN
INTRAMUSCULAR | Status: AC
  Filled 2023-10-11: qty 2

## 2023-10-11 MED ORDER — ONDANSETRON HCL 4 MG/2ML IJ SOLN
INTRAMUSCULAR | Status: AC
  Filled 2023-10-11: qty 2

## 2023-10-11 MED ORDER — ACETAMINOPHEN 500 MG PO TABS
1000.0000 mg | ORAL_TABLET | ORAL | Status: AC
  Administered 2023-10-11: 1000 mg via ORAL
  Filled 2023-10-11: qty 2

## 2023-10-11 MED ORDER — PANTOPRAZOLE SODIUM 40 MG PO TBEC
40.0000 mg | DELAYED_RELEASE_TABLET | Freq: Every day | ORAL | Status: DC
  Administered 2023-10-12 – 2023-10-14 (×3): 40 mg via ORAL
  Filled 2023-10-11 (×3): qty 1

## 2023-10-11 MED ORDER — HYDROCORTISONE (PERIANAL) 2.5 % EX CREA: 1.0000 | TOPICAL_CREAM | Freq: Two times a day (BID) | CUTANEOUS | Status: DC | PRN

## 2023-10-11 MED ORDER — HYDROMORPHONE HCL 1 MG/ML IJ SOLN: 0.5000 mg | INTRAMUSCULAR | Status: DC | PRN

## 2023-10-11 MED ORDER — OXYCODONE HCL 5 MG PO TABS
5.0000 mg | ORAL_TABLET | Freq: Four times a day (QID) | ORAL | 0 refills | Status: AC | PRN
  Filled 2023-10-11: qty 20, 5d supply, fill #0

## 2023-10-11 MED ORDER — HYDROCHLOROTHIAZIDE 25 MG PO TABS
25.0000 mg | ORAL_TABLET | Freq: Every day | ORAL | Status: DC
  Administered 2023-10-13 – 2023-10-14 (×2): 25 mg via ORAL
  Filled 2023-10-11 (×3): qty 1

## 2023-10-11 MED ORDER — ALBUTEROL-BUDESONIDE 90-80 MCG/ACT IN AERO
2.0000 | INHALATION_SPRAY | RESPIRATORY_TRACT | Status: DC | PRN
Start: 2023-10-11 — End: 2023-10-11

## 2023-10-11 MED ORDER — HEPARIN SODIUM (PORCINE) 5000 UNIT/ML IJ SOLN
5000.0000 [IU] | Freq: Three times a day (TID) | INTRAMUSCULAR | Status: DC
  Administered 2023-10-11 – 2023-10-14 (×9): 5000 [IU] via SUBCUTANEOUS
  Filled 2023-10-11 (×9): qty 1

## 2023-10-11 MED ORDER — ALVIMOPAN 12 MG PO CAPS: 12.0000 mg | ORAL_CAPSULE | Freq: Two times a day (BID) | ORAL | Status: DC

## 2023-10-11 MED ORDER — OXYCODONE HCL 5 MG PO TABS
ORAL_TABLET | ORAL | Status: AC
  Administered 2023-10-11: 5 mg via ORAL
  Filled 2023-10-11: qty 1

## 2023-10-11 MED ORDER — HYDRALAZINE HCL 20 MG/ML IJ SOLN: 10.0000 mg | INTRAMUSCULAR | Status: DC | PRN

## 2023-10-11 MED ORDER — ALVIMOPAN 12 MG PO CAPS
12.0000 mg | ORAL_CAPSULE | ORAL | Status: AC
Start: 2023-10-11 — End: 2023-10-11
  Administered 2023-10-11: 12 mg via ORAL
  Filled 2023-10-11: qty 1

## 2023-10-11 MED ORDER — TRAMADOL HCL 50 MG PO TABS
50.0000 mg | ORAL_TABLET | Freq: Four times a day (QID) | ORAL | Status: DC | PRN
  Administered 2023-10-11 – 2023-10-13 (×8): 50 mg via ORAL
  Filled 2023-10-11 (×9): qty 1

## 2023-10-11 MED ORDER — LIDOCAINE 2% (20 MG/ML) 5 ML SYRINGE
INTRAMUSCULAR | Status: DC | PRN
  Administered 2023-10-11: 60 mg via INTRAVENOUS

## 2023-10-11 MED ORDER — ONDANSETRON HCL 4 MG/2ML IJ SOLN
4.0000 mg | Freq: Four times a day (QID) | INTRAMUSCULAR | Status: DC | PRN
  Administered 2023-10-13: 4 mg via INTRAVENOUS
  Filled 2023-10-11: qty 2

## 2023-10-11 MED ORDER — 0.9 % SODIUM CHLORIDE (POUR BTL) OPTIME
TOPICAL | Status: DC | PRN
Start: 2023-10-11 — End: 2023-10-11
  Administered 2023-10-11: 2000 mL

## 2023-10-11 MED ORDER — OLOPATADINE-MOMETASONE 665-25 MCG/ACT NA SUSP: 1.0000 | Freq: Every day | NASAL | Status: DC

## 2023-10-11 MED ORDER — ENSURE SURGERY PO LIQD
237.0000 mL | Freq: Two times a day (BID) | ORAL | Status: DC
Start: 2023-10-11 — End: 2023-10-14
  Administered 2023-10-11 – 2023-10-14 (×4): 237 mL via ORAL

## 2023-10-11 MED ORDER — SIMETHICONE 80 MG PO CHEW: 40.0000 mg | CHEWABLE_TABLET | Freq: Four times a day (QID) | ORAL | Status: DC | PRN

## 2023-10-11 MED ORDER — DEXAMETHASONE SODIUM PHOSPHATE 10 MG/ML IJ SOLN
INTRAMUSCULAR | Status: DC | PRN
  Administered 2023-10-11: 4 mg via INTRAVENOUS

## 2023-10-11 MED ORDER — CHLORHEXIDINE GLUCONATE 0.12 % MT SOLN
15.0000 mL | Freq: Once | OROMUCOSAL | Status: AC
  Administered 2023-10-11: 15 mL via OROMUCOSAL

## 2023-10-11 MED ORDER — ALUM & MAG HYDROXIDE-SIMETH 200-200-20 MG/5ML PO SUSP
30.0000 mL | Freq: Four times a day (QID) | ORAL | Status: DC | PRN
Start: 2023-10-11 — End: 2023-10-14

## 2023-10-11 MED ORDER — TAMSULOSIN HCL 0.4 MG PO CAPS
0.4000 mg | ORAL_CAPSULE | Freq: Every evening | ORAL | Status: DC
  Administered 2023-10-11 – 2023-10-13 (×3): 0.4 mg via ORAL
  Filled 2023-10-11 (×3): qty 1

## 2023-10-11 MED ORDER — DIPHENHYDRAMINE HCL 12.5 MG/5ML PO ELIX: 12.5000 mg | ORAL_SOLUTION | Freq: Four times a day (QID) | ORAL | Status: DC | PRN

## 2023-10-11 MED ORDER — EMTRICITABINE-TENOFOVIR AF 200-25 MG PO TABS
1.0000 | ORAL_TABLET | Freq: Every day | ORAL | Status: DC
  Administered 2023-10-12 – 2023-10-14 (×3): 1 via ORAL
  Filled 2023-10-11 (×4): qty 1

## 2023-10-11 MED ORDER — MONTELUKAST SODIUM 10 MG PO TABS
10.0000 mg | ORAL_TABLET | Freq: Every day | ORAL | Status: DC
  Administered 2023-10-11 – 2023-10-13 (×3): 10 mg via ORAL
  Filled 2023-10-11 (×3): qty 1

## 2023-10-11 MED ORDER — ROSUVASTATIN CALCIUM 10 MG PO TABS
10.0000 mg | ORAL_TABLET | Freq: Every day | ORAL | Status: DC
  Administered 2023-10-12 – 2023-10-14 (×3): 10 mg via ORAL
  Filled 2023-10-11 (×2): qty 1
  Filled 2023-10-11: qty 2

## 2023-10-11 MED ORDER — IPRATROPIUM BROMIDE 0.06 % NA SOLN: 2.0000 | Freq: Every day | NASAL | Status: DC | PRN

## 2023-10-11 MED ORDER — IBUPROFEN 400 MG PO TABS: 600.0000 mg | ORAL_TABLET | Freq: Four times a day (QID) | ORAL | Status: DC | PRN

## 2023-10-11 MED ORDER — METRONIDAZOLE 500 MG PO TABS
1000.0000 mg | ORAL_TABLET | ORAL | Status: DC
Start: 2023-10-11 — End: 2023-10-11

## 2023-10-11 MED ORDER — MIDAZOLAM HCL 2 MG/2ML IJ SOLN
INTRAMUSCULAR | Status: DC | PRN
  Administered 2023-10-11: 2 mg via INTRAVENOUS

## 2023-10-11 MED ORDER — LACTATED RINGERS IV SOLN: INTRAVENOUS | Status: AC

## 2023-10-11 SURGICAL SUPPLY — 98 items
APPLIER CLIP 5 13 M/L LIGAMAX5 (MISCELLANEOUS)
APPLIER CLIP ROT 10 11.4 M/L (STAPLE)
BAG COUNTER SPONGE SURGICOUNT (BAG) IMPLANT
BLADE EXTENDED COATED 6.5IN (ELECTRODE) ×2 IMPLANT
CANNULA REDUCER 12-8 DVNC XI (CANNULA) ×2 IMPLANT
CHLORAPREP W/TINT 26 (MISCELLANEOUS) ×2 IMPLANT
CLIP APPLIE 5 13 M/L LIGAMAX5 (MISCELLANEOUS) IMPLANT
CLIP APPLIE ROT 10 11.4 M/L (STAPLE) IMPLANT
CLIP LIGATING HEM O LOK PURPLE (MISCELLANEOUS) IMPLANT
CLIP LIGATING HEMO O LOK GREEN (MISCELLANEOUS) IMPLANT
COVER SURGICAL LIGHT HANDLE (MISCELLANEOUS) ×4 IMPLANT
COVER TIP SHEARS 8 DVNC (MISCELLANEOUS) ×2 IMPLANT
DEFOGGER SCOPE WARMER CLEARIFY (MISCELLANEOUS) ×2 IMPLANT
DERMABOND ADVANCED .7 DNX12 (GAUZE/BANDAGES/DRESSINGS) IMPLANT
DEVICE TROCAR PUNCTURE CLOSURE (ENDOMECHANICALS) IMPLANT
DRAIN CHANNEL 19F RND (DRAIN) ×2 IMPLANT
DRAPE ARM DVNC X/XI (DISPOSABLE) ×8 IMPLANT
DRAPE COLUMN DVNC XI (DISPOSABLE) ×2 IMPLANT
DRAPE SURG IRRIG POUCH 19X23 (DRAPES) ×2 IMPLANT
DRIVER NDL LRG 8 DVNC XI (INSTRUMENTS) ×2 IMPLANT
DRIVER NDLE LRG 8 DVNC XI (INSTRUMENTS) ×2
DRSG OPSITE POSTOP 4X10 (GAUZE/BANDAGES/DRESSINGS) IMPLANT
DRSG OPSITE POSTOP 4X6 (GAUZE/BANDAGES/DRESSINGS) IMPLANT
DRSG OPSITE POSTOP 4X8 (GAUZE/BANDAGES/DRESSINGS) IMPLANT
DRSG TEGADERM 2-3/8X2-3/4 SM (GAUZE/BANDAGES/DRESSINGS) ×10 IMPLANT
DRSG TEGADERM 4X4.75 (GAUZE/BANDAGES/DRESSINGS) ×2 IMPLANT
ELECT REM PT RETURN 15FT ADLT (MISCELLANEOUS) ×2 IMPLANT
ENDOLOOP SUT PDS II 0 18 (SUTURE) IMPLANT
EVACUATOR SILICONE 100CC (DRAIN) ×2 IMPLANT
GAUZE SPONGE 2X2 8PLY STRL LF (GAUZE/BANDAGES/DRESSINGS) ×2 IMPLANT
GAUZE SPONGE 4X4 12PLY STRL (GAUZE/BANDAGES/DRESSINGS) IMPLANT
GLOVE BIO SURGEON STRL SZ7.5 (GLOVE) ×6 IMPLANT
GLOVE INDICATOR 8.0 STRL GRN (GLOVE) ×6 IMPLANT
GOWN SRG XL LVL 4 BRTHBL STRL (GOWNS) ×2 IMPLANT
GOWN STRL REUS W/ TWL XL LVL3 (GOWN DISPOSABLE) ×10 IMPLANT
GRASPER SUT TROCAR 14GX15 (MISCELLANEOUS) IMPLANT
GRASPER TIP-UP FEN DVNC XI (INSTRUMENTS) ×2 IMPLANT
HOLDER FOLEY CATH W/STRAP (MISCELLANEOUS) ×2 IMPLANT
IRRIG SUCT STRYKERFLOW 2 WTIP (MISCELLANEOUS) ×2
IRRIGATION SUCT STRKRFLW 2 WTP (MISCELLANEOUS) ×2 IMPLANT
KIT PROCEDURE DVNC SI (MISCELLANEOUS) IMPLANT
KIT TURNOVER KIT A (KITS) IMPLANT
NDL INSUFFLATION 14GA 120MM (NEEDLE) ×2 IMPLANT
NEEDLE INSUFFLATION 14GA 120MM (NEEDLE) ×2
PACK CARDIOVASCULAR III (CUSTOM PROCEDURE TRAY) ×2 IMPLANT
PACK COLON (CUSTOM PROCEDURE TRAY) ×2 IMPLANT
PAD POSITIONING PINK XL (MISCELLANEOUS) ×2 IMPLANT
PENCIL SMOKE EVACUATOR (MISCELLANEOUS) IMPLANT
PROTECTOR NERVE ULNAR (MISCELLANEOUS) ×4 IMPLANT
RELOAD STAPLE 45 3.5 BLU DVNC (STAPLE) IMPLANT
RELOAD STAPLE 45 4.3 GRN DVNC (STAPLE) IMPLANT
RELOAD STAPLE 60 3.5 BLU DVNC (STAPLE) IMPLANT
RELOAD STAPLE 60 4.3 GRN DVNC (STAPLE) IMPLANT
RETRACTOR WND ALEXIS 18 MED (MISCELLANEOUS) IMPLANT
RTRCTR WOUND ALEXIS 18CM MED (MISCELLANEOUS)
SCISSORS LAP 5X35 DISP (ENDOMECHANICALS) IMPLANT
SCISSORS MNPLR CVD DVNC XI (INSTRUMENTS) ×2 IMPLANT
SEAL UNIV 5-12 XI (MISCELLANEOUS) ×8 IMPLANT
SEALER VESSEL EXT DVNC XI (MISCELLANEOUS) ×2 IMPLANT
SLEEVE ADV FIXATION 5X100MM (TROCAR) IMPLANT
SOL ELECTROSURG ANTI STICK (MISCELLANEOUS) ×2
SOLUTION ELECTROSURG ANTI STCK (MISCELLANEOUS) ×2 IMPLANT
SPIKE FLUID TRANSFER (MISCELLANEOUS) ×2 IMPLANT
STAPLER 60 SUREFORM DVNC (STAPLE) IMPLANT
STAPLER ECHELON POWER CIR 29 (STAPLE) IMPLANT
STAPLER ECHELON POWER CIR 31 (STAPLE) IMPLANT
STAPLER RELOAD 3.5X45 BLU DVNC (STAPLE)
STAPLER RELOAD 3.5X60 BLU DVNC (STAPLE)
STAPLER RELOAD 4.3X45 GRN DVNC (STAPLE)
STAPLER RELOAD 4.3X60 GRN DVNC (STAPLE) ×2
STOPCOCK 4 WAY LG BORE MALE ST (IV SETS) ×4 IMPLANT
SURGILUBE 2OZ TUBE FLIPTOP (MISCELLANEOUS) ×2 IMPLANT
SUT MNCRL AB 4-0 PS2 18 (SUTURE) ×2 IMPLANT
SUT PDS AB 1 CT1 27 (SUTURE) IMPLANT
SUT PDS AB 1 TP1 96 (SUTURE) IMPLANT
SUT PROLENE 0 CT 2 (SUTURE) IMPLANT
SUT PROLENE 2 0 KS (SUTURE) ×2 IMPLANT
SUT PROLENE 2 0 SH DA (SUTURE) IMPLANT
SUT SILK 2 0 SH CR/8 (SUTURE) IMPLANT
SUT SILK 2-0 18XBRD TIE 12 (SUTURE) IMPLANT
SUT SILK 3 0 SH CR/8 (SUTURE) ×2 IMPLANT
SUT SILK 3-0 18XBRD TIE 12 (SUTURE) ×2 IMPLANT
SUT V-LOC BARB 180 2/0GR6 GS22 (SUTURE)
SUT VIC AB 3-0 SH 18 (SUTURE) IMPLANT
SUT VIC AB 3-0 SH 27XBRD (SUTURE) IMPLANT
SUT VICRYL 0 UR6 27IN ABS (SUTURE) ×2 IMPLANT
SUTURE V-LC BRB 180 2/0GR6GS22 (SUTURE) IMPLANT
SYR 10ML LL (SYRINGE) ×2 IMPLANT
SYS LAPSCP GELPORT 120MM (MISCELLANEOUS)
SYS WOUND ALEXIS 18CM MED (MISCELLANEOUS) ×2
SYSTEM LAPSCP GELPORT 120MM (MISCELLANEOUS) IMPLANT
SYSTEM WOUND ALEXIS 18CM MED (MISCELLANEOUS) ×2 IMPLANT
TAPE UMBILICAL 1/8 X36 TWILL (MISCELLANEOUS) ×2 IMPLANT
TOWEL OR NON WOVEN STRL DISP B (DISPOSABLE) ×2 IMPLANT
TRAY FOLEY MTR SLVR 16FR STAT (SET/KITS/TRAYS/PACK) ×2 IMPLANT
TROCAR ADV FIXATION 5X100MM (TROCAR) ×2 IMPLANT
TUBING CONNECTING 10 (TUBING) ×6 IMPLANT
TUBING INSUFFLATION 10FT LAP (TUBING) ×2 IMPLANT

## 2023-10-11 NOTE — Discharge Instructions (Signed)
POST OP INSTRUCTIONS AFTER COLON SURGERY  DIET: Be sure to include lots of fluids daily to stay hydrated - 64oz of water per day (8, 8 oz glasses).  Avoid fast food or heavy meals for the first couple of weeks as your are more likely to get nauseated. Avoid raw/uncooked fruits or vegetables for the first 4 weeks (its ok to have these if they are blended into smoothie form). If you have fruits/vegetables, make sure they are cooked until soft enough to mash on the roof of your mouth and chew your food well. Otherwise, diet as tolerated.  Take your usually prescribed home medications unless otherwise directed.  PAIN CONTROL: Pain is best controlled by a usual combination of three different methods TOGETHER: Ice/Heat Over the counter pain medication Prescription pain medication Most patients will experience some swelling and bruising around the surgical site.  Ice packs or heating pads (30-60 minutes up to 6 times a day) will help. Some people prefer to use ice alone, heat alone, alternating between ice & heat.  Experiment to what works for you.  Swelling and bruising can take several weeks to resolve.   It is helpful to take an over-the-counter pain medication regularly for the first few weeks: Ibuprofen (Motrin/Advil) - 200mg tabs - take 3 tabs (600mg) every 6 hours as needed for pain (unless you have been directed previously to avoid NSAIDs/ibuprofen) Acetaminophen (Tylenol) - you may take 650mg every 6 hours as needed. You can take this with motrin as they act differently on the body. If you are taking a narcotic pain medication that has acetaminophen in it, do not take over the counter tylenol at the same time. NOTE: You may take both of these medications together - most patients  find it most helpful when alternating between the two (i.e. Ibuprofen at 6am, tylenol at 9am, ibuprofen at 12pm ...) A  prescription for pain medication should be given to you upon discharge.  Take your pain medication as  prescribed if your pain is not adequatly controlled with the over-the-counter pain reliefs mentioned above.  Avoid getting constipated.  Between the surgery and the pain medications, it is common to experience some constipation.  Increasing fluid intake and taking a fiber supplement (such as Metamucil, Citrucel, FiberCon, MiraLax, etc) 1-2 times a day regularly will usually help prevent this problem from occurring.  A mild laxative (prune juice, Milk of Magnesia, MiraLax, etc) should be taken according to package directions if there are no bowel movements after 48 hours.    Dressing: Your incisions are covered in Dermabond which is like sterile superglue for the skin. This will come off on it's own in a couple weeks. It is waterproof and you may bathe normally starting the day after your surgery in a shower. Avoid baths/pools/lakes/oceans until your wounds have fully healed.  ACTIVITIES as tolerated:   Avoid heavy lifting (>10lbs or 1 gallon of milk) for the next 6 weeks. You may resume regular daily activities as tolerated--such as daily self-care, walking, climbing stairs--gradually increasing activities as tolerated.  If you can walk 30 minutes without difficulty, it is safe to try more intense activity such as jogging, treadmill, bicycling, low-impact aerobics.  DO NOT PUSH THROUGH PAIN.  Let pain be your guide: If it hurts to do something, don't do it. You may drive when you are no longer taking prescription pain medication, you can comfortably wear a seatbelt, and you can safely maneuver your car and apply brakes.  FOLLOW UP in our   office Please call [facility] at [phone] to set up an appointment to see your surgeon in the office for a follow-up appointment approximately 2 weeks after your surgery. Make sure that you call for this appointment the day you arrive home to insure a convenient appointment time.  9. If you have disability or family leave forms that need to be completed, you may have  them completed by your primary care physician's office; for return to work instructions, please ask our office staff and they will be happy to assist you in obtaining this documentation   When to call us [phone]: Poor pain control Reactions / problems with new medications (rash/itching, etc)  Fever over 101.5 F (38.5 C) Inability to urinate Nausea/vomiting Worsening swelling or bruising Continued bleeding from incision. Increased pain, redness, or drainage from the incision  The clinic staff is available to answer your questions during regular business hours (8:30am-5pm).  Please don't hesitate to call and ask to speak to one of our nurses for clinical concerns.   A surgeon from [facility] is always on call at the hospitals   If you have a medical emergency, go to the nearest emergency room or call 911.  [facility] [address], [address], [place], [place]  [address] MAIN: [phone] FAX: [phone] [email or web address]  

## 2023-10-11 NOTE — Anesthesia Postprocedure Evaluation (Signed)
Anesthesia Post Note  Patient: Randall Reed.  Procedure(s) Performed: XI ROBOTIC ASSISTED LOWER ANTERIOR RESECTION, SIGMOIDECTOMY WITH INTRAOPERATIVE ASSESSMENT OF PERFUSION ICG, BILATERAL TAP BLOCK (Abdomen) FLEXIBLE SIGMOIDOSCOPY     Patient location during evaluation: PACU Anesthesia Type: General Level of consciousness: awake and alert Pain management: pain level controlled Vital Signs Assessment: post-procedure vital signs reviewed and stable Respiratory status: spontaneous breathing, nonlabored ventilation, respiratory function stable and patient connected to nasal cannula oxygen Cardiovascular status: blood pressure returned to baseline and stable Postop Assessment: no apparent nausea or vomiting Anesthetic complications: no   No notable events documented.  Last Vitals:  Vitals:   10/11/23 1227 10/11/23 1328  BP: 120/82 127/81  Pulse:  71  Resp: 16   Temp: 36.6 C   SpO2: 97% 93%    Last Pain:  Vitals:   10/11/23 1227  TempSrc: Oral  PainSc:                  Amando Ishikawa S

## 2023-10-11 NOTE — H&P (Signed)
CC: Here today for surgery  HPI: Randall Schmied. is an 52 y.o. male with history of HTN, HLD, GERD, BPH, whom is seen in the office today as a referral by Dr. Seymour Bars for evaluation of history of diverticulitis.  Colonoscopy with Dr. Stark-07/17/2022 - Examined portion of ileum normal - Single solitary ulcer at ileocecal valve, biopsy. - Severe diverticulosis in the left colon with narrowing and diverticular spasm. - Stricture in the sigmoid associated diverticulosis. - External hemorrhoids.  PATH - SEVERELY ACTIVE CHRONIC NONSPECIFIC ENTERITIS WITH EROSION (SEE COMMENT) - NEGATIVE FOR GRANULOMA, DYSPLASIA OR MALIGNANCY  Of note, terminal ileum was normal on endoscopic evaluation  CT A/P 04/2023 (LLQ pain) 1. Sigmoid diverticulosis with interval increase in circumferential wall thickening and fat stranding about the mid sigmoid, consistent with acute diverticulitis. No evidence of complicating perforation or abscess at this time. 2. Prostatomegaly.  CT A/P 05/2021 Cluster of prominent nodes in the sigmoid mesentery. These may be related to history of diverticulitis but no acute inflammation is seen at the patient's multiple left colonic diverticula. Please ensure there is up-to-date colonoscopic screening.  CT A/P 04/23/2022 1. Colonic diverticulosis with query uncomplicated acute diverticulitis. Underlying mass lesion is not excluded. Recommend colonoscopy status post treatment and status post complete resolution of inflammatory changes to exclude an underlying lesion. 2. Aortic Atherosclerosis (ICD10-I70.0).  CT A/P 05/02/22  Interval improvement but with some residual left lower quadrant sigmoid diverticulitis compared 04/23/2022.  He reports numerous bouts of left lower quadrant pain and attributes this to diverticulitis. When he has had CT confirmation of diverticulitis, this correlates with pain in the left lower quadrant. In just the last year he reports at least 4  bouts of this kind of pain will last for days and is typically dull. He has been prescribed antibiotics on 4 separate occasions he states over the last year by GI. With antibiotics, the ensuing days, his symptoms do improve. He does also note some left-sided including up in the left upper quadrant fullness typically alleviated with bowel movements. He has been taking intermittent MiraLAX. Appetite is fair. He does note some degree of urgency following ingestion of large meals with bowel movements following. Denies any blood in his stool. He notes that a lot of his symptoms have been helped with Bentyl including his left upper quadrant spasms.  CMP 04/08/23 - Albumin 4.6  INTERVAL HX CT A/P 04/11/23 1. Sigmoid diverticulosis with interval increase in circumferential wall thickening and fat stranding about the mid sigmoid, consistent with acute diverticulitis. No evidence of complicating perforation or abscess at this time. 2. Prostatomegaly.  CT coronary 04/21/23 (Dr. Excell Seltzer) Dilatation of the ascending aorta to 4 cm. Recommend annual imaging followup by CTA or MRA. This recommendation follows 2010 ACCF/AHA/AATS/ACR/ASA/SCA/SCAI/SIR/STS/SVM Guidelines for the Diagnosis and Management of Patients with Thoracic Aortic Disease. Circulation. 2010; 121: W098-J191. Aortic aneurysm NOS (ICD10-I71.9)  Left hepatic cyst stable from 2022. No further follow-up is recommended.  Has done well in the interim. He did report that he wanted to ultimately consider surgery in December for work reasons. He denies any other changes in his health. Occasional left-sided abdominal pain. Will occasionally take MiraLAX as well. Denies any nausea or vomiting.  He denies any changes in health or health history since we met in the office. No new medications/allergies. He states he is ready for surgery today.  Past Medical History:  Diagnosis Date   Anxiety    Asthma    Complication of anesthesia  oxygen levelds  dropped  with colonoscopy   Depression    Dyspnea    sometimes with asthma has sob   GERD (gastroesophageal reflux disease)    Headache    Hyperlipidemia    Phreesia 12/05/2020   Pneumonia    Seasonal allergic reaction    Sleep apnea     Past Surgical History:  Procedure Laterality Date   CIRCUMCISION     COLONOSCOPY     LIPOMA EXCISION N/A 10/30/2018   Procedure: EXCISION OF MULTIPLE SUBCUTANEOUS LIPOMAS ON TORSO;  Surgeon: Manus Rudd, MD;  Location: [facility];  Service: General;  Laterality: N/A;    Family History  Problem Relation Age of Onset   Coronary artery disease Father 72       stents, CABG   Heart attack Father    Heart attack Maternal Grandfather    Coronary artery disease Paternal Grandfather    Hypertension Other    Heart attack Other    Cancer Other    Breast cancer Other    Hyperlipidemia Other    Stomach cancer Neg Hx    Esophageal cancer Neg Hx    Colon cancer Neg Hx     Social:  reports that he has never smoked. He has never used smokeless tobacco. He reports current alcohol use of about 3.0 standard drinks of alcohol per week. He reports that he does not use drugs.  Allergies:  Allergies  Allergen Reactions   Desloratadine Other (See Comments)    CLARINEX-"severe headache"   Loratadine Other (See Comments)    "severe headache"   Ketek [Telithromycin] Itching and Rash   Levbid [Hyoscyamine Sulfate] Rash    Medications: I have reviewed the patient's current medications.  Results for orders placed or performed during the hospital encounter of 10/11/23 (from the past 48 hour(s))  ABO/Rh     Status: None (Preliminary result)   Collection Time: 10/11/23  6:20 AM  Result Value Ref Range   ABO/RH(D) PENDING     No results found.   PE Blood pressure 114/81, pulse 84, temperature 98.5 F (36.9 C), temperature source Oral, resp. rate 16, height 5\' 11"  (1.803 m), weight 116.6 kg, SpO2 93%. Constitutional: NAD;  conversant Eyes: Moist conjunctiva; no lid lag; anicteric Lungs: Normal respiratory effort CV: RRR GI: Abd soft, NT/ND; no palpable hepatosplenomegaly Psychiatric: Appropriate affect  Results for orders placed or performed during the hospital encounter of 10/11/23 (from the past 48 hour(s))  ABO/Rh     Status: None (Preliminary result)   Collection Time: 10/11/23  6:20 AM  Result Value Ref Range   ABO/RH(D) PENDING     No results found.  A/P: Randall Reed. is an 52 y.o. 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 The pathophysiology of was discussed as well with associated pictures using our medical illustrations. At our previous visit, we provided him a copy of our Diverticular Disease (handout, Ralene Muskrat, 7 pages). -We have again discussed various different treatment options going forward including surgery (the most definitive) to address this -robotic assisted low anterior resection with colorectal anastomosis; intraoperative assessment of perfusion (indocyanine green), flexible sigmoidoscopy -We discussed that with further medical management, he will likely have recurrences of the diverticulitis and over time and that the stricture that he  does have may progress. We discussed situations were urgent or emergent surgery could even be necessary if a full obstruction were to occur. We discussed surgery as a rationale to help reduce the risk of subsequent diverticulitis and the evident endoscopic appearing stricture he has. -The planned procedure, material risks (including, but not limited to, pain, bleeding, infection, scarring, need for blood transfusion, damage to surrounding structures- blood vessels/nerves/viscus/organs, damage to ureter, urine leak,  leak from anastomosis, need for additional procedures, sexual dysfunction/erectile dysfunction, low anterior resection syndrome (LARS) = increased fecal urgency and/or frequency in some cases, scenarios where a stoma may be necessary and where it may be permanent, worsening of pre-existing medical conditions, chronic diarrhea, constipation secondary to narcotic use, hernia, recurrence despite surgery, pneumonia, heart attack, stroke, death) benefits and alternatives to surgery were discussed at length. The patient's questions were answered to his satisfaction, he voiced understanding and elected to proceed with surgery. Additionally, we discussed typical postoperative expectations and the recovery process.   -We also re-discussed no foreign bodies/anorectal intercourse x 3 months post-procedure to allow time for healing.  Marin Olp, MD [facility]

## 2023-10-11 NOTE — Anesthesia Procedure Notes (Signed)
Procedure Name: Intubation Date/Time: 10/11/2023 7:43 AM  Performed by: Sindy Guadeloupe, CRNAPre-anesthesia Checklist: Patient identified, Emergency Drugs available, Suction available, Patient being monitored and Timeout performed Patient Re-evaluated:Patient Re-evaluated prior to induction Oxygen Delivery Method: Circle system utilized Preoxygenation: Pre-oxygenation with 100% oxygen Induction Type: IV induction Ventilation: Mask ventilation without difficulty Laryngoscope Size: Mac and 4 Grade View: Grade I Tube type: Oral Tube size: 7.5 mm Number of attempts: 1 Airway Equipment and Method: Stylet Placement Confirmation: ETT inserted through vocal cords under direct vision, positive ETCO2 and breath sounds checked- equal and bilateral Secured at: 22 cm Tube secured with: Tape Dental Injury: Teeth and Oropharynx as per pre-operative assessment

## 2023-10-11 NOTE — Anesthesia Preprocedure Evaluation (Signed)
Anesthesia Evaluation  Patient identified by MRN, date of birth, ID band Patient awake    Reviewed: Allergy & Precautions, H&P , NPO status , Patient's Chart, lab work & pertinent test results  Airway Mallampati: II   Neck ROM: full    Dental   Pulmonary asthma , sleep apnea    breath sounds clear to auscultation       Cardiovascular hypertension,  Rhythm:regular Rate:Normal     Neuro/Psych  Headaches PSYCHIATRIC DISORDERS Anxiety Depression       GI/Hepatic ,GERD  ,,  Endo/Other    Renal/GU      Musculoskeletal   Abdominal   Peds  Hematology   Anesthesia Other Findings   Reproductive/Obstetrics                             Anesthesia Physical Anesthesia Plan  ASA: 2  Anesthesia Plan: General   Post-op Pain Management:    Induction: Intravenous  PONV Risk Score and Plan: 2 and Ondansetron, Dexamethasone, Midazolam and Treatment may vary due to age or medical condition  Airway Management Planned: Oral ETT  Additional Equipment:   Intra-op Plan:   Post-operative Plan: Extubation in OR  Informed Consent: I have reviewed the patients History and Physical, chart, labs and discussed the procedure including the risks, benefits and alternatives for the proposed anesthesia with the patient or authorized representative who has indicated his/her understanding and acceptance.     Dental advisory given  Plan Discussed with: CRNA, Anesthesiologist and Surgeon  Anesthesia Plan Comments:        Anesthesia Quick Evaluation

## 2023-10-11 NOTE — Op Note (Signed)
PATIENT: Randall Reed  52 y.o. male  Patient Care Team: Shade Flood, MD as PCP - General (Family Medicine) Tonny Bollman, MD as PCP - Cardiology (Cardiology) Talmage Coin, MD as Consulting Physician (Endocrinology)  PREOP DIAGNOSIS: DIVERTICULITIS  POSTOP DIAGNOSIS: DIVERTICULITIS  PROCEDURE:  Robotic assisted low anterior resection with double stapled colorectal anastomosis Flexible sigmoidoscopy Intraoperative assessment of perfusion using ICG fluorescence imaging Bilateral transversus abdominus plane (TAP) blocks  SURGEON: Stephanie Coup. Cliffton Asters, MD  ASSISTANT: Karie Soda, MD  ANESTHESIA: General endotracheal  EBL: 100 mL Total I/O In: 1300 [I.V.:1200; IV Piggyback:100] Out: 400 [Urine:300; Blood:100]  DRAINS: None  SPECIMEN: Rectosigmoid colon - open end proximal  COUNTS: Sponge, needle and instrument counts were reported correct x2  FINDINGS: Significant mesenteric adiposity.  Phlegmon involving the mid and distal sigmoid colon/chronic fibrosis.  This was clearly the area of disease.  Robotic assisted low anterior resection was necessary as the disease encroached on the proximalmost aspect of the rectum.  A well perfused, tension free, hemostatic, air tight 31 mm EEA colorectal anastomosis fashioned 15 cm from the anal verge by flexible sigmoidoscopy.   NARRATIVE: Informed consent was verified. The patient was taken to the operating room, placed supine on the operating table and SCD's were applied. General endotracheal anesthesia was induced without difficulty.  He was then positioned in the lithotomy position with Allen stirrups.  Pressure points were evaluated and padded.  A foley catheter was then placed by nursing under sterile conditions. Hair on the abdomen was clipped.  The was secured to the operating table. The abdomen and perineum was then prepped and draped in the standard sterile fashion. Surgical timeout was called indicating the correct  patient, procedure, positioning and need for preoperative antibiotics.   An OG tube was placed by anesthesia and confirmed to be to suction.  At Palmer's point, a stab incision was created and the Veress needle was introduced into the peritoneal cavity on the first attempt.  Intraperitoneal location was confirmed by the aspiration and saline drop test.  Pneumoperitoneum was established to a maximum pressure of 15 mmHg using CO2.  Following this, the abdomen was marked for planned trocar sites.  Just to the right and cephalad to the umbilicus, an 8 mm incision was created and an 8 mm blunt tipped robotic trocar was cautiously placed into the peritoneal cavity.  The laparoscope was inserted and demonstrated no evidence of trocar site nor Veress needle site complications.  The Veress needle was removed.  Bilateral transversus abdominis plane blocks were then created using a dilute mixture of Exparel with Marcaine.  3 additional 8 mm robotic trochars were placed under direct visualization roughly in a line extending from the right ASIS towards the left upper quadrant. The bladder was inspected and noted to be at/below the pubic symphysis.  Staying 3 fingerbreadths above the pubic symphysis, an incision was created and the 12 mm robotic trocar inserted directed cephalad into the peritoneal cavity under direct visualization.  An additional 5 mm assist port was placed in the right lateral abdomen under direct visualization.  The abdomen was surveyed and there was phlegmon's/chronic fibrotic type changes to the mid and distal sigmoid colon.  He was positioned in Trendelenburg with the left side tilted slightly up.  Small bowel was carefully retracted out of the pelvis.  The robot was then docked and I went to the console.  We began with adhesiolysis. Adhesions consisting of sigmoid colon were carefully mobilized from the left lateral sidewall.  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  There also bandlike adhesions within the pelvis between the sigmoid and the pelvic inlet consistent with prior diverticulitis.  The mid distal sigmoid colon is difficult to manipulate due to how chronically fibrotic the colon is.   Attachments of the sigmoid colon were taken down from the intersigmoid fossa.  The rectosigmoid colon was grasped and elevated anteriorly.  Beginning with a medial to lateral approach, the peritoneum overlying the presacral space was carefully incised.  The TME plane was readily gained working in a plane between the fascia propria of the rectum and the presacral fascia.  Hypogastric nerves were seen going along the the presacral fascia and were protected free of injury.  Working more proximally, the mesorectum and sigmoid mesentery were carefully mobilized off of the peritoneum.  The left ureter was identified and protected free of injury.  The left gonadal vessels were identified and protected.  These were both swept "down."  The superior hemorrhoidal and IMA pedicles were identified. Further mesocolon was mobilized proximally staying in this plane between the retroperitoneum proper and the mesocolon. Attention was then turned to the lateral portion of dissection.  The sigmoid colon was then retracted to the right.  The sigmoid colon was fully mobilized. The descending colon was mobilized by incising the Geraldo Haris line of Toldt.  This was done all the way up to the level of the splenic flexure.  The associated mesocolon was also mobilized medially.  The left ureter again was confirmed to be well away from the vasculature which had been dissected medially.  The rectosigmoid colon was elevated anteriorly. The left ureter was re-identified. The IMA was clear of this. The IMA was then divided with the vessel sealer. The stump was inspected and noted to be completely hemostatic with a good seal.  The mesentery was divided out to the point of planned proximal  division.  Working more distally, the rectum was identified where the tinea had splayed and there were loss of appendices epiploica.  This also corresponded to a location overlying the sacral promontory.  Anatomically, this clearly represents the proximal rectum.  The mesentery out to this level was then cleared using the vessel sealer. The distal point of transection on the proximal rectum was identified.   A 60 mm green load robotic stapler was then placed through the 12 mm port and introduced into the peritoneal cavity.  The rectum was divided with a single firing of the stapler.  The stump was intact and healthy in appearance.   Attention was turned to performing a perfusion test. ICG was administered by anesthesia and at the level of the cleared mesentery proximally, there was excellent uptake of the tracer.  The rectum was also well perfused in appearance.  There was a visible pulse in the mesentery out to the level of the cleared colon at the level of the proximal sigmoid/descending colon junction.  This colon is also supple and healthy in appearance without any thickening.  This reached into the pelvis without any difficulty and remained in that location without any tension. A locking grasper was then placed on the sigmoid staple line.   Attention was turned to the extracorporeal portion of the procedure.  The robot was undocked.  I scrubbed back in.  Using the 12 mm trocar site, a Pfannenstiel incision was created and incorporated the fascial opening through the 12 mm port site.  The rectus fascia was incised and then  elevated.  The rectus muscle was mobilized free of the overlying fascia.  The peritoneum was incised in the midline well above the location of the bladder.  An Alexis wound protector was placed.  Towels were placed around the field.  The divided colon was passed through the wound protector.  The point of proximal division was identified and was again on a healthy segment of supple colon  with a palpable pulse in the mesentery. This was pink in color.  A pursestring device was applied.  A 2-0 Prolene on a Keith needle was passed.  The colon was divided and passed off with the open end being proximal.  EEA sizers were then introduced and a 31 mm EEA selected.  "Belt loops" consisting of 3-0 silk were placed around the pursestring suture line.  The anvil was placed and the pursestring tied.  A small amount of fat was cleared from the planned anastomosis and no diverticula were apparent within this.  This was placed back into the abdomen and a cap placed over the wound protector port site.  Pneumoperitoneum was reestablished.  I then went below to pass the stapler.  My partner remained above.  EEA sizers were cautiously introduced via the anus and advanced under direct visualization.  The stapler was passed and the spike deployed just anterior to the staple line.  The components were then mated.  Orientation was confirmed such that there is no twisting of the colon nor small bowel underneath the mesenteric defect. Care was taken to ensure no other structures were incorporated within this either.  The stapler was then closed, held, and fired. This was then removed. The donuts were inspected and noted to be complete.  The colon proximal to the anastomosis was then gently occluded. The pelvis was filled with sterile irrigation. Under direct visualization, I passed a flexible sigmoidoscope.  The anastomosis was under water.  With good distention of the anastomosis there was no air leak. The anastomosis pink in appearance.  This is located at 15 cm from the anal verge by flexible sigmoidoscopy.  It is hemostatic.  Additionally, looking from above, there is no tension on the colon or mesentery.  Sigmoidoscope was withdrawn.  Irrigation was evacuated from the pelvis.  The abdomen and pelvis are surveyed and noted to be completely hemostatic without any apparent injury.  Under direct visualization, all trochars  are removed.  The Alexis wound protector was removed.  Gowns/gloves are changed and a fresh set of clean instruments utilized. Additional sterile drapes were placed around the field.   The Pfannenstiel peritoneum was closed with a running 2-0 Vicryl suture.  The rectus fascia was then closed using 2 running #1 PDS sutures.  The fascia was then palpated and noted to be completely closed.  Additional anesthetic was infiltrated at the Pfannenstiel site.  Sponge, needle, and instrument counts were reported correct x2. 4-0 Monocryl subcuticular suture was used to close the skin of all incision sites.  Dermabond was placed over all incisions.  A honeycomb dressing placed over the Pfannenstiel as well.   He was then taken out of lithotomy, awakened from anesthesia, extubated, and transferred to a stretcher for transport to PACU in satisfactory condition having tolerated the procedure well.

## 2023-10-11 NOTE — Transfer of Care (Signed)
Immediate Anesthesia Transfer of Care Note  Patient: Cordy Box.  Procedure(s) Performed: XI ROBOTIC ASSISTED LOWER ANTERIOR RESECTION, SIGMOIDECTOMY WITH INTRAOPERATIVE ASSESSMENT OF PERFUSION ICG, BILATERAL TAP BLOCK (Abdomen) FLEXIBLE SIGMOIDOSCOPY  Patient Location: PACU  Anesthesia Type:General  Level of Consciousness: awake, drowsy, and patient cooperative  Airway & Oxygen Therapy: Patient Spontanous Breathing and Patient connected to face mask oxygen  Post-op Assessment: Report given to RN and Post -op Vital signs reviewed and stable  Post vital signs: Reviewed and stable  Last Vitals:  Vitals Value Taken Time  BP 130/85 10/11/23 1045  Temp    Pulse 80 10/11/23 1046  Resp 20 10/11/23 1046  SpO2 91 % 10/11/23 1046  Vitals shown include unfiled device data.  Last Pain:  Vitals:   10/11/23 0653  TempSrc:   PainSc: 0-No pain         Complications: No notable events documented.

## 2023-10-11 NOTE — Plan of Care (Signed)
  Problem: Education: Goal: Understanding of discharge needs will improve Outcome: Progressing Goal: Verbalization of understanding of the causes of altered bowel function will improve Outcome: Progressing

## 2023-10-11 NOTE — TOC Initial Note (Addendum)
Transition of Care (TOC) - Initial/Assessment Note    Patient Details  Name: Randall Reed. MRN: [id number] Date of Birth: 1971/07/28  [facility]) CM/SW Contact:    Adrian Prows, RN Phone Number: 10/11/2023, 3:38 PM  Clinical Narrative:                 Randall Reed w/ pt and mother Randall Reed [phone]); pt says he lives at home; he plans to return at d/c; pt verified he has PCP/insurance; pt's mother will transport him home; he denies SDOH risks; pt says he does not have DME, HH services, or home oxygen; no TOC needs; TOC is signing off; please place consult if needed.  Expected Discharge Plan: Home/Self Care Barriers to Discharge: Continued Medical Work up   Patient Goals and CMS Choice Patient states their goals for this hospitalization and ongoing recovery are:: home          Expected Discharge Plan and Services   Discharge Planning Services: CM Consult   Living arrangements for the past 2 months: Apartment                                      Prior Living Arrangements/Services Living arrangements for the past 2 months: Apartment Lives with:: Self Patient language and need for interpreter reviewed:: Yes Do you feel safe going back to the place where you live?: Yes      Need for Family Participation in Patient Care: Yes (Comment) Care giver support system in place?: Yes (comment) Current home services:  (n/a) Criminal Activity/Legal Involvement Pertinent to Current Situation/Hospitalization: No - Comment as needed  Activities of Daily Living   ADL Screening (condition at time of admission) Independently performs ADLs?: Yes (appropriate for developmental age) Is the patient deaf or have difficulty hearing?: No Does the patient have difficulty seeing, even when wearing glasses/contacts?: No Does the patient have difficulty concentrating, remembering, or making decisions?: No  Permission Sought/Granted Permission sought to share  information with : Case Manager Permission granted to share information with : Yes, Verbal Permission Granted  Share Information with NAME: Case Manager     Permission granted to share info w Relationship: Bacilio Rhyner (mother) [phone]     Emotional Assessment Appearance:: Appears stated age Attitude/Demeanor/Rapport: Gracious Affect (typically observed): Accepting Orientation: : Oriented to Self, Oriented to Place, Oriented to  Time, Oriented to Situation Alcohol / Substance Use: Not Applicable Psych Involvement: No (comment)  Admission diagnosis:  S/P laparoscopic-assisted sigmoidectomy [Z90.49] Patient Active Problem List   Diagnosis Date Noted   S/P laparoscopic-assisted sigmoidectomy 10/11/2023   Coronary artery disease involving native coronary artery of native heart without angina pectoris 03/05/2019   Irritable bowel syndrome 03/14/2016   Asthma 11/19/2015   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   rotator cuff tear 10/17/2013   Hypogonadism male 09/04/2013   ED (erectile dysfunction) 07/30/2013   Palpitations 02/21/2012   BMI 33.0-33.9,adult 02/04/2012   Allergic rhinitis 02/04/2012   GERD (gastroesophageal reflux disease) 02/04/2012   Migraine 02/04/2012   GAD (generalized anxiety disorder) 02/04/2012   Insomnia 02/04/2012   Essential hypertension 04/13/2009   CHEST PAIN-UNSPECIFIED 04/13/2009   PCP:  Shade Flood, MD Pharmacy:   [facility]) [phone] - [place]ice Lim, [place] - [address] AT [facility] [address] [place] [address] Phone: [phone] Fax: [phone]  [id number] [place] Otto, [place] -  [address] AT [facility] OF [address] [place] [address] Phone: [phone] Fax: [phone]  [facility] [address] [place] - [address] [place] [address] Phone: [phone] Fax: [phone]  [facility] [place], [place] - [address] [place] [address] Phone: [phone] Fax: [phone]     Social Determinants of Health (SDOH) Social History: SDOH Screenings   Food Insecurity: No Food Insecurity (10/11/2023)  Housing: Patient Declined (10/11/2023)  Transportation Needs: No Transportation Needs (10/11/2023)  Utilities: Not At Risk (10/11/2023)  Alcohol Screen: Low Risk  (04/23/2023)  Depression (PHQ2-9): Low Risk  (07/04/2023)  Physical Activity: Sufficiently Active (04/23/2023)  Social Connections: Unknown (04/23/2023)  Stress: Stress Concern Present (04/23/2023)  Tobacco Use: Low Risk  (10/11/2023)   SDOH Interventions: Food Insecurity Interventions: Intervention Not Indicated, Inpatient TOC Housing Interventions: Intervention Not Indicated, Inpatient TOC Transportation Interventions: Intervention Not Indicated, Inpatient TOC Utilities Interventions: Intervention Not Indicated, Inpatient TOC   Readmission Risk Interventions     No data to display

## 2023-10-12 ENCOUNTER — Encounter (HOSPITAL_COMMUNITY): Payer: Self-pay | Admitting: Surgery

## 2023-10-12 LAB — BASIC METABOLIC PANEL
Anion gap: 6 (ref 5–15)
BUN: 11 mg/dL (ref 6–20)
CO2: 23 mmol/L (ref 22–32)
Calcium: 8.5 mg/dL — ABNORMAL LOW (ref 8.9–10.3)
Chloride: 106 mmol/L (ref 98–111)
Creatinine, Ser: 0.99 mg/dL (ref 0.61–1.24)
GFR, Estimated: >60 mL/min (ref >60)
Glucose, Bld: 106 mg/dL — ABNORMAL HIGH (ref 70–99)
Potassium: 3.9 mmol/L (ref 3.5–5.1)
Sodium: 135 mmol/L (ref 135–145)

## 2023-10-12 LAB — CBC
HCT: 44.8 % (ref 39.0–52.0)
Hemoglobin: 14.2 g/dL (ref 13.0–17.0)
MCH: 27.5 pg (ref 26.0–34.0)
MCHC: 31.7 g/dL (ref 30.0–36.0)
MCV: 86.7 fL (ref 80.0–100.0)
Platelets: 175 10*3/uL (ref 150–400)
RBC: 5.17 MIL/uL (ref 4.22–5.81)
RDW: 15.2 % (ref 11.5–15.5)
WBC: 10 10*3/uL (ref 4.0–10.5)
nRBC: 0 % (ref 0.0–0.2)

## 2023-10-12 MED ORDER — PHENYLEPHRINE-MINERAL OIL-PET 0.25-14-74.9 % RE OINT
1.0000 | TOPICAL_OINTMENT | Freq: Two times a day (BID) | RECTAL | Status: DC | PRN
  Administered 2023-10-12: 1 via RECTAL
  Filled 2023-10-12: qty 57

## 2023-10-12 NOTE — Progress Notes (Signed)
Pt has ambulated independently around the unit, states he had a bm and urine output this morning

## 2023-10-12 NOTE — Progress Notes (Signed)
1 Day Post-Op   Subjective/Chief Complaint: Pt with min pain Having some liq BMs   Objective: Vital signs in last 24 hours: Temp:  [97.7 F (36.5 C)-98.6 F (37 C)] 97.9 F (36.6 C) (12/07 0547) Pulse Rate:  [71-83] 77 (12/07 0547) Resp:  [12-20] 19 (12/07 0547) BP: (108-140)/(74-93) 116/75 (12/07 0547) SpO2:  [92 %-98 %] 94 % (12/07 0547) Weight:  [116.6 kg] 116.6 kg (12/07 0500) Last BM Date : 10/11/23  Intake/Output from previous day: 12/06 0701 - 12/07 0700 In: 4735.9 [P.O.:2060; I.V.:2575.9; IV Piggyback:100] Out: 1750 [Urine:1650; Blood:100] Intake/Output this shift: No intake/output data recorded.  PE:  Constitutional: No acute distress, conversant, appears states age. Eyes: Anicteric sclerae, moist conjunctiva, no lid lag Lungs: Clear to auscultation bilaterally, normal respiratory effort CV: regular rate and rhythm, no murmurs, no peripheral edema, pedal pulses 2+ GI: Soft, no masses or hepatosplenomegaly, non-tender to palpation, inc c/d/i Skin: No rashes, palpation reveals normal turgor Psychiatric: appropriate judgment and insight, oriented to person, place, and time   Lab Results:  Recent Labs    10/12/23 0437  WBC 10.0  HGB 14.2  HCT 44.8  PLT 175   BMET Recent Labs    10/12/23 0437  NA 135  K 3.9  CL 106  CO2 23  GLUCOSE 106*  BUN 11  CREATININE 0.99  CALCIUM 8.5*   PT/INR No results for input(s): "LABPROT", "INR" in the last 72 hours. ABG No results for input(s): "PHART", "HCO3" in the last 72 hours.  Invalid input(s): "PCO2", "PO2"  Studies/Results: No results found.  Anti-infectives: Anti-infectives (From admission, onward)    Start     Dose/Rate Route Frequency Ordered Stop   10/12/23 1000  emtricitabine-tenofovir AF (DESCOVY) 200-25 MG per tablet 1 tablet        1 tablet Oral Daily 10/11/23 1218     10/11/23 1400  neomycin (MYCIFRADIN) tablet 1,000 mg  Status:  Discontinued       Placed in "And" Linked Group   1,000 mg  Oral 3 times per day 10/11/23 0541 10/11/23 0546   10/11/23 1400  metroNIDAZOLE (FLAGYL) tablet 1,000 mg  Status:  Discontinued       Placed in "And" Linked Group   1,000 mg Oral 3 times per day 10/11/23 0541 10/11/23 0546   10/11/23 0600  cefoTEtan (CEFOTAN) 2 g in sodium chloride 0.9 % 100 mL IVPB        2 g 200 mL/hr over 30 Minutes Intravenous On call to O.R. 10/11/23 0541 10/11/23 0746       Assessment/Plan: s/p Procedure(s) with comments: XI ROBOTIC ASSISTED LOWER ANTERIOR RESECTION, SIGMOIDECTOMY WITH INTRAOPERATIVE ASSESSMENT OF PERFUSION ICG, BILATERAL TAP BLOCK (N/A) - 180 FLEXIBLE SIGMOIDOSCOPY (N/A) POD #1 -Con't ADAT -bowel fxn returning -mobilize -con't pain control -home likely Sun/Mon  LOS: 1 day    Axel Filler 10/12/2023

## 2023-10-13 LAB — CBC
HCT: 43.5 % (ref 39.0–52.0)
Hemoglobin: 14 g/dL (ref 13.0–17.0)
MCH: 27.9 pg (ref 26.0–34.0)
MCHC: 32.2 g/dL (ref 30.0–36.0)
MCV: 86.8 fL (ref 80.0–100.0)
Platelets: 179 10*3/uL (ref 150–400)
RBC: 5.01 MIL/uL (ref 4.22–5.81)
RDW: 15.3 % (ref 11.5–15.5)
WBC: 7.6 10*3/uL (ref 4.0–10.5)
nRBC: 0 % (ref 0.0–0.2)

## 2023-10-13 LAB — BASIC METABOLIC PANEL
Anion gap: 8 (ref 5–15)
BUN: 11 mg/dL (ref 6–20)
CO2: 25 mmol/L (ref 22–32)
Calcium: 8.5 mg/dL — ABNORMAL LOW (ref 8.9–10.3)
Chloride: 104 mmol/L (ref 98–111)
Creatinine, Ser: 0.96 mg/dL (ref 0.61–1.24)
GFR, Estimated: >60 mL/min (ref >60)
Glucose, Bld: 90 mg/dL (ref 70–99)
Potassium: 3.7 mmol/L (ref 3.5–5.1)
Sodium: 137 mmol/L (ref 135–145)

## 2023-10-13 NOTE — Progress Notes (Signed)
2 Days Post-Op   Subjective/Chief Complaint: Doing well this aM Amb in hall well Bowel fxn   Objective: Vital signs in last 24 hours: Temp:  [97.8 F (36.6 C)-98.5 F (36.9 C)] 98.5 F (36.9 C) (12/08 0529) Pulse Rate:  [75-86] 75 (12/08 0529) Resp:  [16-18] 18 (12/08 0529) BP: (107-123)/(65-85) 110/84 (12/08 0529) SpO2:  [93 %-98 %] 93 % (12/08 0529) Last BM Date : 10/12/23  Intake/Output from previous day: 12/07 0701 - 12/08 0700 In: 1080 [P.O.:1080] Out: -  Intake/Output this shift: No intake/output data recorded.  PE:  Constitutional: No acute distress, conversant, appears states age. Eyes: Anicteric sclerae, moist conjunctiva, no lid lag Lungs: Clear to auscultation bilaterally, normal respiratory effort CV: regular rate and rhythm, no murmurs, no peripheral edema, pedal pulses 2+ GI: Soft, no masses or hepatosplenomegaly, non-tender to palpation, inc c/d/i Skin: No rashes, palpation reveals normal turgor Psychiatric: appropriate judgment and insight, oriented to person, place, and time   Lab Results:  Recent Labs    10/12/23 0437 10/13/23 0452  WBC 10.0 7.6  HGB 14.2 14.0  HCT 44.8 43.5  PLT 175 179   BMET Recent Labs    10/12/23 0437 10/13/23 0452  NA 135 137  K 3.9 3.7  CL 106 104  CO2 23 25  GLUCOSE 106* 90  BUN 11 11  CREATININE 0.99 0.96  CALCIUM 8.5* 8.5*   PT/INR No results for input(s): "LABPROT", "INR" in the last 72 hours. ABG No results for input(s): "PHART", "HCO3" in the last 72 hours.  Invalid input(s): "PCO2", "PO2"  Studies/Results: No results found.  Anti-infectives: Anti-infectives (From admission, onward)    Start     Dose/Rate Route Frequency Ordered Stop   10/12/23 1000  emtricitabine-tenofovir AF (DESCOVY) 200-25 MG per tablet 1 tablet        1 tablet Oral Daily 10/11/23 1218     10/11/23 1400  neomycin (MYCIFRADIN) tablet 1,000 mg  Status:  Discontinued       Placed in "And" Linked Group   1,000 mg Oral 3  times per day 10/11/23 0541 10/11/23 0546   10/11/23 1400  metroNIDAZOLE (FLAGYL) tablet 1,000 mg  Status:  Discontinued       Placed in "And" Linked Group   1,000 mg Oral 3 times per day 10/11/23 0541 10/11/23 0546   10/11/23 0600  cefoTEtan (CEFOTAN) 2 g in sodium chloride 0.9 % 100 mL IVPB        2 g 200 mL/hr over 30 Minutes Intravenous On call to O.R. 10/11/23 0541 10/11/23 0746       Assessment/Plan: s/p Procedure(s) with comments: XI ROBOTIC ASSISTED LOWER ANTERIOR RESECTION, SIGMOIDECTOMY WITH INTRAOPERATIVE ASSESSMENT OF PERFUSION ICG, BILATERAL TAP BLOCK (N/A) - 180 FLEXIBLE SIGMOIDOSCOPY (N/A) POD #2 -Con't ADAT -bowel fxn returning -mobilize -con't pain control -home likely Mon  LOS: 2 days    Axel Filler 10/13/2023

## 2023-10-13 NOTE — Plan of Care (Signed)
  Problem: Activity: Goal: Ability to tolerate increased activity will improve Outcome: Progressing   Problem: Bowel/Gastric: Goal: Gastrointestinal status for postoperative course will improve Outcome: Progressing

## 2023-10-14 ENCOUNTER — Other Ambulatory Visit: Payer: Self-pay | Admitting: Gastroenterology

## 2023-10-14 ENCOUNTER — Other Ambulatory Visit (HOSPITAL_COMMUNITY): Payer: Self-pay

## 2023-10-14 LAB — BASIC METABOLIC PANEL
Anion gap: 9 (ref 5–15)
BUN: 12 mg/dL (ref 6–20)
CO2: 25 mmol/L (ref 22–32)
Calcium: 8.9 mg/dL (ref 8.9–10.3)
Chloride: 99 mmol/L (ref 98–111)
Creatinine, Ser: 1.09 mg/dL (ref 0.61–1.24)
GFR, Estimated: >60 mL/min (ref >60)
Glucose, Bld: 111 mg/dL — ABNORMAL HIGH (ref 70–99)
Potassium: 3.4 mmol/L — ABNORMAL LOW (ref 3.5–5.1)
Sodium: 133 mmol/L — ABNORMAL LOW (ref 135–145)

## 2023-10-14 LAB — CBC
HCT: 50.1 % (ref 39.0–52.0)
Hemoglobin: 16 g/dL (ref 13.0–17.0)
MCH: 27.7 pg (ref 26.0–34.0)
MCHC: 31.9 g/dL (ref 30.0–36.0)
MCV: 86.7 fL (ref 80.0–100.0)
Platelets: 177 10*3/uL (ref 150–400)
RBC: 5.78 MIL/uL (ref 4.22–5.81)
RDW: 15.4 % (ref 11.5–15.5)
WBC: 8.2 10*3/uL (ref 4.0–10.5)
nRBC: 0 % (ref 0.0–0.2)

## 2023-10-14 LAB — SURGICAL PATHOLOGY

## 2023-10-14 NOTE — Plan of Care (Signed)
  Problem: Education: Goal: Understanding of discharge needs will improve Outcome: Progressing   Problem: Activity: Goal: Ability to tolerate increased activity will improve Outcome: Progressing

## 2023-10-14 NOTE — Discharge Summary (Signed)
Patient ID: Randall Reed. MRN: [id number] DOB/AGE: 1971-04-29 52 y.o.  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 [name] 06/04/2014   rotator cuff tear 10/17/2013   Hypogonadism male 09/04/2013   ED (erectile dysfunction) 07/30/2013   Palpitations 02/21/2012   BMI 33.0-33.9,adult 02/04/2012   Allergic rhinitis 02/04/2012   GERD (gastroesophageal reflux disease) 02/04/2012   Migraine 02/04/2012   GAD (generalized anxiety disorder) 02/04/2012   Insomnia 02/04/2012   Essential hypertension 04/13/2009   CHEST PAIN-UNSPECIFIED 04/13/2009    Consultants None  Procedures OR 10/11/23 - Robotic assisted low anterior resection with double stapled colorectal anastomosis Flexible sigmoidoscopy Intraoperative assessment of perfusion using ICG fluorescence imaging Bilateral transversus abdominus plane (TAP) blocks  Hospital Course: Admitted postoperatively and recovered well. His diet was sequentially advanced which he tolerated. He was noted to be mobilizing well on his own, pain controlled, having reliably return of bowel function. On 10/14/23, he is comfortable with and stable for discharge home.  Afebrile, VS normal NAD, comfortable, up walking RRR  Abd soft, nontender, incisions c/d without erythema nor drainage. Some ecchymoses at Pfannenstiel  WB 8.2 Hgb stable at 16 Cr 1.09 UOP - multiple undocumented voids  BM x2 recorded in last 24 hrs; +flatus  Postoperative expectations and instructions reviewed. All of his questions were answered. He expressed understanding and agreement with the plan. Follow-up in the office has been  arranged  Allergies as of 10/14/2023       Reactions   Desloratadine Other (See Comments)   CLARINEX-"severe headache"   Loratadine Other (See Comments)   "severe headache"   Ketek [telithromycin] Itching, Rash   Levbid [hyoscyamine Sulfate] Rash        Medication List     TAKE these medications    Airsupra 90-80 MCG/ACT Aero Generic drug: Albuterol-Budesonide Inhale 2 puffs into the lungs every 4 (four) hours as needed.   albuterol 108 (90 Base) MCG/ACT inhaler Commonly known as: VENTOLIN HFA INHALE 2 PUFFS INTO THE LUNGS EVERY 6 HOURS AS NEEDED FOR WHEEZING OR SHORTNESS OF BREATH   Allegra Allergy 180 MG tablet Generic drug: fexofenadine Take 180 mg by mouth daily.   ALPRAZolam 1 MG tablet Commonly known as: XANAX TAKE 1 TABLET BY MOUTH TWICE DAILY AS NEEDED   amLODipine 10 MG tablet Commonly known as: NORVASC Take 10 mg by mouth every evening.   amoxicillin-clavulanate 875-125 MG tablet Commonly known as: AUGMENTIN Take 1 tablet by mouth 2 (two) times daily. For 7 days   anastrozole 1 MG tablet Commonly known as: ARIMIDEX Take 0.5 mg by mouth once a week.   aspirin EC 81 MG tablet Take 1 tablet (81 mg total) by mouth daily.   Breztri Aerosphere 160-9-4.8 MCG/ACT Aero Generic drug: Budeson-Glycopyrrol-Formoterol Inhale 2 puffs into the lungs in the morning and at bedtime.   celecoxib 200 MG capsule Commonly known as: CELEBREX Take 200 mg by mouth 2 (two) times daily as needed for moderate pain (pain score 4-6).   diltiazem 180 MG 24 hr capsule Commonly known as: CARDIZEM CD TAKE 1 CAPSULE(180 MG) BY MOUTH DAILY   diltiazem 2 % Gel Pea sized amount to perianal skin before bowel movement, up to twice per day as needed.  Dupixent 300 MG/2ML prefilled syringe Generic drug: dupilumab Inject 300 mg into the skin every 14 (fourteen) days.   emtricitabine-tenofovir 200-300 MG tablet Commonly known as: TRUVADA Take 1 tablet by mouth daily.    famotidine 40 MG tablet Commonly known as: PEPCID Take 40 mg by mouth daily as needed for heartburn or indigestion.   glycopyrrolate 2 MG tablet Commonly known as: ROBINUL Take 1 tablet (2 mg total) by mouth 2 (two) times daily.   hydrochlorothiazide 25 MG tablet Commonly known as: HYDRODIURIL Take 25 mg by mouth daily.   hydrocortisone 2.5 % rectal cream Commonly known as: ANUSOL-HC APPLY RECTALLY TWICE DAILY What changed:  how much to take how to take this when to take this reasons to take this additional instructions   hyoscyamine 0.125 MG SL tablet Commonly known as: Levsin/SL Place 1 tablet (0.125 mg total) under the tongue 4 (four) times daily -  with meals and at bedtime.   ipratropium 0.06 % nasal spray Commonly known as: ATROVENT Place 2 sprays into the nose 2 (two) times daily. What changed:  when to take this reasons to take this   ipratropium-albuterol 0.5-2.5 (3) MG/3ML Soln Commonly known as: DUONEB Take 3 mLs by nebulization every 6 (six) hours as needed (Shortness of breath or wheezing).   losartan 25 MG tablet Commonly known as: COZAAR TAKE 1 TABLET(25 MG) BY MOUTH DAILY   metoprolol succinate 25 MG 24 hr tablet Commonly known as: TOPROL-XL Take 1 tablet (25 mg total) by mouth daily.   montelukast 10 MG tablet Commonly known as: SINGULAIR TAKE 1 TABLET(10 MG) BY MOUTH AT BEDTIME   mupirocin ointment 2 % Commonly known as: BACTROBAN Apply 1 application topically 2 (two) times daily.   NexIUM 40 MG capsule Generic drug: esomeprazole Take 40 mg by mouth in the morning.   ondansetron 4 MG disintegrating tablet Commonly known as: ZOFRAN-ODT DISSOLVE 1 TABLET(4 MG) ON THE TONGUE EVERY 8 HOURS AS NEEDED FOR NAUSEA OR VOMITING   oxyCODONE 5 MG immediate release tablet Commonly known as: Roxicodone Take 1 tablet (5 mg total) by mouth every 6 (six) hours as needed for up to 5 days (postop pain not controlled with tylenol/ibuprofen first).    pantoprazole 40 MG tablet Commonly known as: PROTONIX Take 1 tablet (40 mg total) by mouth 2 (two) times daily. What changed: when to take this   Potassium Chloride ER 20 MEQ Tbcr Take 1 tablet (20 mEq total) by mouth daily.   pramoxine-hydrocortisone 1-1 % rectal cream Commonly known as: PROCTOCREAM-HC Place rectally 3 (three) times daily. What changed:  how much to take when to take this reasons to take this   promethazine-dextromethorphan 6.25-15 MG/5ML syrup Commonly known as: PROMETHAZINE-DM Take 5 mLs by mouth 3 (three) times daily as needed for cough.   rizatriptan 10 MG disintegrating tablet Commonly known as: MAXALT-MLT DISSOLVE 1 TABLET ON THE TONGUE AS NEEDED FOR HEADACHE. MAY REPEAT IN 2 HOURS AS NEEDED   rosuvastatin 10 MG tablet Commonly known as: CRESTOR Take 1 tablet (10 mg total) by mouth daily.   [name] 657-84 MCG/ACT Susp Generic drug: Olopatadine-Mometasone Place 1 spray into the nose in the morning and at bedtime.   sildenafil 100 MG tablet Commonly known as: VIAGRA Take 1/2-1 tablet by mouth once daily as needed prior to sexual activity *MUST KEEP UPCOMING APPT*   Spacer/Aero-Holding [employer] Use with inhaler   tadalafil 5 MG tablet Commonly known as: CIALIS Take 5 mg by mouth daily.  tamsulosin 0.4 MG Caps capsule Commonly known as: FLOMAX TAKE 1 CAPSULE(0.4 MG) BY MOUTH DAILY AS NEEDED What changed: See the new instructions.   testosterone cypionate 200 MG/ML injection Commonly known as: DEPOTESTOSTERONE CYPIONATE Inject 100 mg into the muscle once a week.   Vitamin D (Ergocalciferol) 1.25 MG (50000 UNIT) Caps capsule Commonly known as: DRISDOL TAKE ONE CAPSULE BY MOUTH EVERY 7 DAYS   zolpidem 10 MG tablet Commonly known as: AMBIEN TAKE 1 TABLET(10 MG) BY MOUTH AT BEDTIME           Stephanie Coup. Cliffton Asters, M.D. [facility]

## 2023-10-14 NOTE — Progress Notes (Signed)
AVS reviewed w/ pt & mother in room. Both verbalized an understanding- No other questions at this time. Pt given a print out of low fiber diet pe rpt interest. F/U appt made per pt. PIV removed by primary RN. Pt dressed. To lobby via w/c - home w/ mom

## 2023-10-15 ENCOUNTER — Telehealth: Payer: Self-pay

## 2023-10-15 NOTE — Transitions of Care (Post Inpatient/ED Visit) (Signed)
10/15/2023  Name: Randall Reed. MRN: [id number] DOB: 11-22-1970  Today's TOC FU Call Status: Today's TOC FU Call Status:: Successful TOC FU Call Completed TOC FU Call Complete Date: 10/15/23 Patient's Name and Date of Birth confirmed.  Transition Care Management Follow-up Telephone Call Date of Discharge: 10/14/23 Discharge Facility: [facility]) Type of Discharge: Inpatient Admission Primary Inpatient Discharge Diagnosis:: "laparoscopic assisted sigmoidectomy" How have you been since you were released from the hospital?:  ('Doing ok-had trouble gettingcomfortable sleeping position in bed last night, current pain 5/10-took Tylenol around 8:30am-only taking Tylenol & Ibuprofen, appetite fair, BM today-loose, up walking & moving, surg incision looks good from what he can see) Any questions or concerns?: Yes Patient Questions/Concerns:: Patient voices he has called surgeon office to discuss pain mgmt-he doesn't want to take Oxycodone-wants to take lower dose med-like Tramadol which he was taking while inpt-he has not taken a dose of Oxycodone, has only been using Tylenol and Ibuprofen Patient Questions/Concerns Addressed: Other: (Pain mgmt education provided, discussed importance of not exceeding daily 4000mg /day, non med measures such as ice and/or heat packs, pt will f/u and call surgeon office back)  Items Reviewed: Did you receive and understand the discharge instructions provided?: Yes Medications obtained,verified, and reconciled?: Partial Review Completed Reason for Partial Mediation Review: reviewed sx mgmt meds Any new allergies since your discharge?: No Dietary orders reviewed?: Yes Type of Diet Ordered:: "avoid fast food or heavy meals, avoid raw/uncooked fruits or vegetables for the first 4wks" Do you have support at home?: Yes People in Home: parent(s) Name of Support/Comfort Primary Source: mom staying with him and helping him  Medications Reviewed  Today: Medications Reviewed Today     Reviewed by Charlyn Minerva, RN (Registered Nurse) on 10/15/23 at 1300  Med List Status: <None>   Medication Order Taking? Sig Documenting Provider Last Dose Status Informant  acetaminophen (TYLENOL) 500 MG tablet [id number] Yes Take 1,000 mg by mouth every 6 (six) hours as needed for mild pain (pain score 1-3). [provider] Taking Active Self  AIRSUPRA 90-80 MCG/ACT Sandrea Matte [id number]  Inhale 2 puffs into the lungs every 4 (four) hours as needed. [provider]  Active Self  albuterol (VENTOLIN HFA) 108 (90 Base) MCG/ACT inhaler [id number]  INHALE 2 PUFFS INTO THE LUNGS EVERY 6 HOURS AS NEEDED FOR WHEEZING OR SHORTNESS OF BREATH Shade Flood, MD  Active Self  [facility] ALLERGY 180 MG tablet [id number]  Take 180 mg by mouth daily. [provider]  Active Self  ALPRAZolam Prudy Feeler) 1 MG tablet [id number]  TAKE 1 TABLET BY MOUTH TWICE DAILY AS NEEDED Shade Flood, MD  Active   amLODipine (NORVASC) 10 MG tablet [id number]  Take 10 mg by mouth every evening. [provider]  Active Self  amoxicillin-clavulanate (AUGMENTIN) 875-125 MG tablet [id number]  Take 1 tablet by mouth 2 (two) times daily. For 7 days  Patient not taking: Reported on 09/26/2023   Unk [place], [place]  Active Self  anastrozole (ARIMIDEX) 1 MG tablet [id number]  Take 0.5 mg by mouth once a week. [provider]  Active Self  aspirin EC 81 MG tablet [id number]  Take 1 tablet (81 mg total) by mouth daily. Tonny Bollman, MD  Active Self  Budeson-Glycopyrrol-Formoterol (BREZTRI AEROSPHERE) 160-9-4.8 MCG/ACT Sandrea Matte [id number]  Inhale 2 puffs into the lungs in the morning and at bedtime. [provider]  Active Self  celecoxib (CELEBREX) 200 MG capsule [id number]  Take 200 mg by mouth 2 (  two) times daily as needed for moderate pain (pain score 4-6). [provider]  Active Self    Discontinued 07/17/22 1437 (Change in  therapy)   diltiazem (CARDIZEM CD) 180 MG 24 hr capsule [id number]  TAKE 1 CAPSULE(180 MG) BY MOUTH DAILY Tonny Bollman, MD  Active Self  diltiazem 2 % GEL [id number] Yes Pea sized amount to perianal skin before bowel movement, up to twice per day as needed. Shade Flood, MD Taking Active   DUPIXENT 300 MG/2ML prefilled syringe [id number]  Inject 300 mg into the skin every 14 (fourteen) days. [provider]  Active Self  emtricitabine-tenofovir (TRUVADA) 200-300 MG tablet [id number]  Take 1 tablet by mouth daily. Shade Flood, MD  Active Self  esomeprazole (NEXIUM) 40 MG capsule [id number]  Take 40 mg by mouth in the morning. [provider]  Active Self  famotidine (PEPCID) 40 MG tablet [id number]  Take 40 mg by mouth daily as needed for heartburn or indigestion. [provider]  Active Self  glycopyrrolate (ROBINUL) 2 MG tablet [id number]  Take 1 tablet (2 mg total) by mouth 2 (two) times daily.  Patient not taking: Reported on 09/26/2023   Iva Boop, MD  Active Self  hydrochlorothiazide (HYDRODIURIL) 25 MG tablet [id number]  Take 25 mg by mouth daily. [provider]  Active Self  hydrocortisone (ANUSOL-HC) 2.5 % rectal cream [id number]  APPLY RECTALLY TWICE DAILY  Patient taking differently: Place 1 Application rectally 2 (two) times daily as needed for hemorrhoids.   Shade Flood, MD  Active Self  hyoscyamine (LEVSIN/SL) 0.125 MG SL tablet [id number]  Place 1 tablet (0.125 mg total) under the tongue 4 (four) times daily -  with meals and at bedtime. Unk [place], [place]  Active Self  ibuprofen (ADVIL) 200 MG tablet [id number] Yes Take 3 tablets by mouth every 6 (six) hours as needed for mild pain (pain score 1-3). [provider] Taking Active Self  ipratropium (ATROVENT) 0.06 % nasal spray [id number]  Place 2 sprays into the nose 2 (two) times daily.  Patient taking differently: Place 2 sprays into the nose daily as needed for  rhinitis.   Shade Flood, MD  Active Self  ipratropium-albuterol (DUONEB) 0.5-2.5 (3) MG/3ML SOLN [id number]  Take 3 mLs by nebulization every 6 (six) hours as needed (Shortness of breath or wheezing). [provider]  Active Self  losartan (COZAAR) 25 MG tablet [id number]  TAKE 1 TABLET(25 MG) BY MOUTH DAILY Tonny Bollman, MD  Active Self  metoprolol succinate (TOPROL-XL) 25 MG 24 hr tablet [id number]  Take 1 tablet (25 mg total) by mouth daily. Tonny Bollman, MD  Active Self  montelukast (SINGULAIR) 10 MG tablet [id number]  TAKE 1 TABLET(10 MG) BY MOUTH AT BEDTIME Shade Flood, MD  Active Self  mupirocin ointment (BACTROBAN) 2 % [id number]  Apply 1 application topically 2 (two) times daily.  Patient not taking: Reported on 09/26/2023   Shade Flood, MD  Active Self           Med Note Vickey Sages Apr 08, 2023  1:47 PM)    Marrion Coy [facility]) 696-78 MCG/ACT Santina Evans [id number]  Place 1 spray into the nose in the morning and at bedtime. [provider]  Active Self  ondansetron (ZOFRAN-ODT) 4 MG disintegrating tablet [id number] Yes DISSOLVE 1 TABLET(4 MG) ON THE TONGUE EVERY 8 HOURS AS NEEDED FOR NAUSEA OR VOMITING Meryl Dare, MD Taking Active Self  oxyCODONE (ROXICODONE)  5 MG immediate release tablet [id number] No Take 1 tablet (5 mg total) by mouth every 6 (six) hours as needed for up to 5 days (postop pain not controlled with tylenol/ibuprofen first).  Patient not taking: Reported on 10/15/2023   Andria Meuse, MD Not Taking Active            Med Note Marnee Spring   Tue Oct 15, 2023 12:22 PM) Pt state he has called surgeon office to request "a lower level pain med like Tramadol"-does not want to take Oxycodone  pantoprazole (PROTONIX) 40 MG tablet [id number]  TAKE 1 TABLET(40 MG) BY MOUTH TWICE DAILY Meryl Dare, MD  Active   Potassium Chloride ER 20 MEQ TBCR [id number]  Take 1 tablet (20 mEq total) by mouth daily.  Tonny Bollman, MD  Active Self  pramoxine-hydrocortisone [facility]) 1-1 % rectal cream [id number]  Place rectally 3 (three) times daily.  Patient taking differently: Place 1 Application rectally 3 (three) times daily as needed for hemorrhoids or anal itching.   Shade Flood, MD  Active Self  promethazine-dextromethorphan (PROMETHAZINE-DM) 6.25-15 MG/5ML syrup [id number]  Take 5 mLs by mouth 3 (three) times daily as needed for cough. [provider]  Active Self  rizatriptan (MAXALT-MLT) 10 MG disintegrating tablet [id number]  DISSOLVE 1 TABLET ON THE TONGUE AS NEEDED FOR HEADACHE. MAY REPEAT IN 2 HOURS AS NEEDED Alveria Apley, NP  Active Self  rosuvastatin (CRESTOR) 10 MG tablet [id number]  Take 1 tablet (10 mg total) by mouth daily. Tonny Bollman, MD  Active Self  sildenafil (VIAGRA) 100 MG tablet [id number]  Take 1/2-1 tablet by mouth once daily as needed prior to sexual activity *MUST KEEP UPCOMING APPTTonny Bollman, MD  Active Self  Spacer/Aero-Holding Rudean Curt [id number]  Use with inhaler Icard, Rachel Bo, DO  Active Self  tadalafil (CIALIS) 5 MG tablet [id number]  Take 5 mg by mouth daily. [provider]  Active Self  tamsulosin (FLOMAX) 0.4 MG CAPS capsule [id number]  TAKE 1 CAPSULE(0.4 MG) BY MOUTH DAILY AS NEEDED  Patient taking differently: Take 0.4 mg by mouth every evening.   Shade Flood, MD  Active Self  testosterone cypionate (DEPOTESTOSTERONE CYPIONATE) 200 MG/ML injection [id number]  Inject 100 mg into the muscle once a week. [provider]  Active Self  Vitamin D, Ergocalciferol, (DRISDOL) 1.25 MG (50000 UNIT) CAPS capsule [id number]  TAKE ONE CAPSULE BY MOUTH EVERY 7 DAYS Shade Flood, MD  Active Self  zolpidem (AMBIEN) 10 MG tablet [id number]  TAKE 1 TABLET(10 MG) BY MOUTH AT BEDTIME Shade Flood, MD  Active Self            Home Care and Equipment/Supplies: Were Home Health Services Ordered?: NA Any new  equipment or medical supplies ordered?: NA  Functional Questionnaire: Do you need assistance with bathing/showering or dressing?: No Do you need assistance with meal preparation?: No Do you need assistance with eating?: No Do you have difficulty maintaining continence: No Do you need assistance with getting out of bed/getting out of a chair/moving?: No Do you have difficulty managing or taking your medications?: No  Follow up appointments reviewed: PCP Follow-up appointment confirmed?: Yes Date of PCP follow-up appointment?: 10/25/23 Follow-up Provider: Dr. Neva Seat Specialist [facility] Follow-up appointment confirmed?: Yes Date of Specialist follow-up appointment?: 11/11/23 Follow-Up Specialty Provider:: Dr. Cliffton Asters Do you need transportation to your follow-up appointment?: No (pt aware of driving restrictions per d/c instructions-will have his mother drive him until he is able  to drive) Do you understand care options if your condition(s) worsen?: Yes-patient verbalized understanding  SDOH Interventions Today    Flowsheet Row Most Recent Value  SDOH Interventions   Food Insecurity Interventions Intervention Not Indicated  Housing Interventions Intervention Not Indicated  Transportation Interventions Intervention Not Indicated  Utilities Interventions Intervention Not Indicated       TOC interventions discussed/reviewed: -Discussed/reviewed insurance/health plans benefits -Doctor visit discussed/reviewed -PCP -Doctor visits discussed/reviewed-Specialist -Provided Verbal Education: 30-day TOC program, nutrition, meds & their functions, pain, bowel, N&V symptom mgmt., fall/safety measures in the home -Disease mgmt. discussed/reviewed: -Post discharge activity limitations per provider, Post op wound/incision care, S/S of infection    Antionette Fairy, RN,BSN,CCM RN Care Manager Transitions of Care  [place]  Direct Phone: [phone] Toll Free:  [phone] Fax: [phone]

## 2023-10-21 DIAGNOSIS — E669 Obesity, unspecified: Secondary | ICD-10-CM | POA: Diagnosis not present

## 2023-10-21 DIAGNOSIS — Z125 Encounter for screening for malignant neoplasm of prostate: Secondary | ICD-10-CM | POA: Diagnosis not present

## 2023-10-21 DIAGNOSIS — N400 Enlarged prostate without lower urinary tract symptoms: Secondary | ICD-10-CM | POA: Diagnosis not present

## 2023-10-21 DIAGNOSIS — E230 Hypopituitarism: Secondary | ICD-10-CM | POA: Diagnosis not present

## 2023-10-23 ENCOUNTER — Other Ambulatory Visit: Payer: Self-pay | Admitting: Family Medicine

## 2023-10-23 DIAGNOSIS — G43109 Migraine with aura, not intractable, without status migrainosus: Secondary | ICD-10-CM

## 2023-10-25 ENCOUNTER — Other Ambulatory Visit (HOSPITAL_COMMUNITY)
Admission: RE | Admit: 2023-10-25 | Discharge: 2023-10-25 | Disposition: A | Discharge status: Home / Self Care | Payer: BC Managed Care – PPO | Source: Ambulatory Visit | Attending: Family Medicine | Admitting: Family Medicine

## 2023-10-25 ENCOUNTER — Ambulatory Visit: Payer: BC Managed Care – PPO | Admitting: Family Medicine

## 2023-10-25 ENCOUNTER — Encounter: Payer: Self-pay | Admitting: Family Medicine

## 2023-10-25 VITALS — BP 116/70 | HR 92 | Temp 98.1°F | Ht 70.5 in | Wt 246.4 lb

## 2023-10-25 DIAGNOSIS — Z0000 Encounter for general adult medical examination without abnormal findings: Secondary | ICD-10-CM

## 2023-10-25 DIAGNOSIS — Z131 Encounter for screening for diabetes mellitus: Secondary | ICD-10-CM

## 2023-10-25 DIAGNOSIS — J309 Allergic rhinitis, unspecified: Secondary | ICD-10-CM

## 2023-10-25 DIAGNOSIS — E559 Vitamin D deficiency, unspecified: Secondary | ICD-10-CM

## 2023-10-25 DIAGNOSIS — Z113 Encounter for screening for infections with a predominantly sexual mode of transmission: Secondary | ICD-10-CM | POA: Diagnosis not present

## 2023-10-25 DIAGNOSIS — Z23 Encounter for immunization: Secondary | ICD-10-CM | POA: Diagnosis not present

## 2023-10-25 DIAGNOSIS — J45909 Unspecified asthma, uncomplicated: Secondary | ICD-10-CM | POA: Diagnosis not present

## 2023-10-25 DIAGNOSIS — Z130 Encounter for screening for diseases of the blood and blood-forming organs and certain disorders involving the immune mechanism: Secondary | ICD-10-CM

## 2023-10-25 DIAGNOSIS — F5104 Psychophysiologic insomnia: Secondary | ICD-10-CM

## 2023-10-25 DIAGNOSIS — Z13220 Encounter for screening for lipoid disorders: Secondary | ICD-10-CM | POA: Diagnosis not present

## 2023-10-25 DIAGNOSIS — Z79899 Other long term (current) drug therapy: Secondary | ICD-10-CM | POA: Diagnosis not present

## 2023-10-25 DIAGNOSIS — F411 Generalized anxiety disorder: Secondary | ICD-10-CM | POA: Diagnosis not present

## 2023-10-25 LAB — COMPREHENSIVE METABOLIC PANEL
ALT: 55 U/L — ABNORMAL HIGH (ref 0–53)
AST: 30 U/L (ref 0–37)
Albumin: 4.2 g/dL (ref 3.5–5.2)
Alkaline Phosphatase: 84 U/L (ref 39–117)
BUN: 17 mg/dL (ref 6–23)
CO2: 26 meq/L (ref 19–32)
Calcium: 9.4 mg/dL (ref 8.4–10.5)
Chloride: 106 meq/L (ref 96–112)
Creatinine, Ser: 1.17 mg/dL (ref 0.40–1.50)
GFR: 70.62 mL/min (ref >60.00)
Glucose, Bld: 98 mg/dL (ref 70–99)
Potassium: 4 meq/L (ref 3.5–5.1)
Sodium: 141 meq/L (ref 135–145)
Total Bilirubin: 0.5 mg/dL (ref 0.2–1.2)
Total Protein: 6.9 g/dL (ref 6.0–8.3)

## 2023-10-25 LAB — CBC WITH DIFFERENTIAL/PLATELET
Basophils Absolute: 0 10*3/uL (ref 0.0–0.1)
Basophils Relative: 0.4 % (ref 0.0–3.0)
Eosinophils Absolute: 0 10*3/uL (ref 0.0–0.7)
Eosinophils Relative: 0.5 % (ref 0.0–5.0)
HCT: 46.2 % (ref 39.0–52.0)
Hemoglobin: 15.6 g/dL (ref 13.0–17.0)
Lymphocytes Relative: 21.1 % (ref 12.0–46.0)
Lymphs Abs: 1.4 10*3/uL (ref 0.7–4.0)
MCHC: 33.8 g/dL (ref 30.0–36.0)
MCV: 84.3 fL (ref 78.0–100.0)
Monocytes Absolute: 0.5 10*3/uL (ref 0.1–1.0)
Monocytes Relative: 7.9 % (ref 3.0–12.0)
Neutro Abs: 4.7 10*3/uL (ref 1.4–7.7)
Neutrophils Relative %: 70.1 % (ref 43.0–77.0)
Platelets: 246 10*3/uL (ref 150.0–400.0)
RBC: 5.48 Mil/uL (ref 4.22–5.81)
RDW: 16.4 % — ABNORMAL HIGH (ref 11.5–15.5)
WBC: 6.7 10*3/uL (ref 4.0–10.5)

## 2023-10-25 LAB — LIPID PANEL
Cholesterol: 114 mg/dL (ref 0–200)
HDL: 31.9 mg/dL — ABNORMAL LOW (ref >39.00)
LDL Cholesterol: 59 mg/dL (ref 0–99)
NonHDL: 82.32
Total CHOL/HDL Ratio: 4
Triglycerides: 115 mg/dL (ref 0.0–149.0)
VLDL: 23 mg/dL (ref 0.0–40.0)

## 2023-10-25 LAB — VITAMIN D 25 HYDROXY (VIT D DEFICIENCY, FRACTURES): VITD: 57.19 ng/mL (ref 30.00–100.00)

## 2023-10-25 LAB — HEMOGLOBIN A1C: Hgb A1c MFr Bld: 5.5 % (ref 4.6–6.5)

## 2023-10-25 MED ORDER — EMTRICITABINE-TENOFOVIR DF 200-300 MG PO TABS: 1.0000 | ORAL_TABLET | Freq: Every day | ORAL | 1 refills | Status: DC

## 2023-10-25 MED ORDER — MONTELUKAST SODIUM 10 MG PO TABS: ORAL_TABLET | ORAL | 3 refills | Status: DC

## 2023-10-25 MED ORDER — ZOLPIDEM TARTRATE 10 MG PO TABS: ORAL_TABLET | ORAL | 0 refills | Status: DC

## 2023-10-25 MED ORDER — ALPRAZOLAM 1 MG PO TABS: ORAL_TABLET | ORAL | 0 refills | Status: DC

## 2023-10-25 MED ORDER — DOXYCYCLINE HYCLATE 100 MG PO TABS: 200.0000 mg | ORAL_TABLET | Freq: Once | ORAL | 11 refills | Status: DC | PRN

## 2023-10-25 NOTE — Patient Instructions (Addendum)
Doxycycline 2 pills after unprotected intercourse. No other med changes at this time.  If any concerns on labs I will let you know.  Take care!  Preventive Care 23-54 Years Old, Male Preventive care refers to lifestyle choices and visits with your health care provider that can promote health and wellness. Preventive care visits are also called wellness exams. What can I expect for my preventive care visit? Counseling During your preventive care visit, your health care provider may ask about your: Medical history, including: Past medical problems. Family medical history. Current health, including: Emotional well-being. Home life and relationship well-being. Sexual activity. Lifestyle, including: Alcohol, nicotine or tobacco, and drug use. Access to firearms. Diet, exercise, and sleep habits. Safety issues such as seatbelt and bike helmet use. Sunscreen use. Work and work Astronomer. Physical exam Your health care provider will check your: Height and weight. These may be used to calculate your BMI (body mass index). BMI is a measurement that tells if you are at a healthy weight. Waist circumference. This measures the distance around your waistline. This measurement also tells if you are at a healthy weight and may help predict your risk of certain diseases, such as type 2 diabetes and high blood pressure. Heart rate and blood pressure. Body temperature. Skin for abnormal spots. What immunizations do I need?  Vaccines are usually given at various ages, according to a schedule. Your health care provider will recommend vaccines for you based on your age, medical history, and lifestyle or other factors, such as travel or where you work. What tests do I need? Screening Your health care provider may recommend screening tests for certain conditions. This may include: Lipid and cholesterol levels. Diabetes screening. This is done by checking your blood sugar (glucose) after you have not  eaten for a while (fasting). Hepatitis B test. Hepatitis C test. HIV (human immunodeficiency virus) test. STI (sexually transmitted infection) testing, if you are at risk. Lung cancer screening. Prostate cancer screening. Colorectal cancer screening. Talk with your health care provider about your test results, treatment options, and if necessary, the need for more tests. Follow these instructions at home: Eating and drinking  Eat a diet that includes fresh fruits and vegetables, whole grains, lean protein, and low-fat dairy products. Take vitamin and mineral supplements as recommended by your health care provider. Do not drink alcohol if your health care provider tells you not to drink. If you drink alcohol: Limit how much you have to 0-2 drinks a day. Know how much alcohol is in your drink. In the [place] one drink equals one 12 oz bottle of beer (355 mL), one 5 oz glass of wine (148 mL), or one 1 oz glass of hard liquor (44 mL). Lifestyle Brush your teeth every morning and night with fluoride toothpaste. Floss one time each day. Exercise for at least 30 minutes 5 or more days each week. Do not use any products that contain nicotine or tobacco. These products include cigarettes, chewing tobacco, and vaping devices, such as e-cigarettes. If you need help quitting, ask your health care provider. Do not use drugs. If you are sexually active, practice safe sex. Use a condom or other form of protection to prevent STIs. Take aspirin only as told by your health care provider. Make sure that you understand how much to take and what form to take. Work with your health care provider to find out whether it is safe and beneficial for you to take aspirin daily. Find healthy ways to manage  stress, such as: Meditation, yoga, or listening to music. Journaling. Talking to a trusted person. Spending time with friends and family. Minimize exposure to UV radiation to reduce your risk of skin  cancer. Safety Always wear your seat belt while driving or riding in a vehicle. Do not drive: If you have been drinking alcohol. Do not ride with someone who has been drinking. When you are tired or distracted. While texting. If you have been using any mind-altering substances or drugs. Wear a helmet and other protective equipment during sports activities. If you have firearms in your house, make sure you follow all gun safety procedures. What's next? Go to your health care provider once a year for an annual wellness visit. Ask your health care provider how often you should have your eyes and teeth checked. Stay up to date on all vaccines. This information is not intended to replace advice given to you by your health care provider. Make sure you discuss any questions you have with your health care provider. Document Revised: 04/19/2021 Document Reviewed: 04/19/2021 Elsevier Patient Education  2024 [employer].

## 2023-10-25 NOTE — Progress Notes (Unsigned)
Subjective:  Patient ID: Randall Reed., male    DOB: 1971-04-06  Age: 52 y.o. MRN: [id number]  CC:  Chief Complaint  Patient presents with   Annual Exam    Pt is doing well, no concerns, pt had an ensure shake this morning -not fasting     HPI Randall Reed. presents for Annual Exam  PCP, me General Surgery, Dr. Cliffton Asters, recent sigmoidectomy on December 6 with history of diverticulitis. Endocrinology, Dr. Sharl Ma Cardiology, Dr. Excell Seltzer Gastroenterology, Dr. Russella Dar, chronic abdominal issues with recurrent diverticulitis, status post surgery as above. Dr. Russella Dar is retiring. Plans to schedule with Dr. Adela Lank. Followed by allergist, ENT with history of allergic rhinitis. Followed by urology, Dr. Lafonda Mosses with history of low testosterone, on replacement as well as tamsulosin.  No other health changes.   Vitamin D deficiency On 50,000 units once per week.  Last vitamin D Lab Results  Component Value Date   VD25OH 43.26 10/25/2022   Hypertension: Followed by cardiology, treated with losartan, Toprol, diltiazem, amlodipine, potassium. Home readings: not usually.  BP Readings from Last 3 Encounters:  10/25/23 116/70  10/14/23 120/81  09/30/23 (!) 121/91   Lab Results  Component Value Date   CREATININE 1.09 10/14/2023   Hyperlipidemia: Crestor 10 mg daily. No new myalgias/side effects.  Lab Results  Component Value Date   CHOL 102 04/09/2023   HDL 34 (L) 04/09/2023   LDLCALC 46 04/09/2023   TRIG 124 04/09/2023   CHOLHDL 3.0 04/09/2023   Lab Results  Component Value Date   ALT 19 09/30/2023   AST 17 09/30/2023   ALKPHOS 68 09/30/2023   BILITOT 0.7 09/30/2023   Prediabetes:Followed by endocrinology. Testosterone now managed by urology with anastrozole.   Some elevated glucose readings in hospital Lab Results  Component Value Date   HGBA1C 5.2 10/25/2022   Wt Readings from Last 3 Encounters:  10/25/23 246 lb 6.4 oz (111.8 kg)  10/12/23 257 lb 0.9 oz  (116.6 kg)  09/30/23 254 lb (115.2 kg)   Anxiety/insomnia Generalized anxiety disorder with multiple attempts of different SSRIs in the past without benefit or tolerance issues.  Ultimately has been well-controlled with use of alprazolam, anywhere from 1 to 3/day.  Average of 2 per day currently. No new side effects. We have discussed the risk of this medication and continued attempts at tapering previously.  Ambien in the evening for insomnia, also longstanding use without new side effects, parasomnias and controlling sleep with current dosing.  Does not combine with the alprazolam.  Controlled substance database reviewed.  Alprazolam 1 mg #60 last filled on 09/27/2023, Ambien 10 mg #80 filled on 08/03/2023.  Recent prescription for oxycodone from his surgery, counseled on avoiding narcotics and benzodiazepines due to the increased risk of accidental overdose. Not taking oxycodone. Only took 1.   Migraine headaches Has been treated with Maxalt as needed, discussed in June.  1 to 2/month at that time. Still stable.   HIV preexposure prophylaxis Truvada daily.  Denies any new penile discharge, or other symptoms of STI.  Last STI screening in September. 2 new partners since last testing.  Would like to start doxypep. Aware of risks vs. benefits.      10/25/2023    8:20 AM 07/04/2023   11:29 AM 04/24/2023    8:34 AM 10/25/2022    2:05 PM 08/15/2022    9:09 AM  Depression screen PHQ 2/9  Decreased Interest 0 0 0 0 0  Down, Depressed, Hopeless 0  0 0 0 0  PHQ - 2 Score 0 0 0 0 0  Altered sleeping 0 0 0 0 0  Tired, decreased energy 0 1 0 0 0  Change in appetite 0 0 0 0 0  Feeling bad or failure about yourself  0 0 0 0 0  Trouble concentrating 0 0 0 0 0  Moving slowly or fidgety/restless 0 0 0 0 0  Suicidal thoughts 0 0 0 0 0  PHQ-9 Score 0 1 0 0 0    Health Maintenance  Topic Date Due   DTaP/Tdap/Td (3 - Td or Tdap) 09/14/2030   Colonoscopy  07/17/2032   INFLUENZA VACCINE  Completed    Hepatitis C Screening  Completed   HIV Screening  Completed   Zoster Vaccines- Shingrix  Completed   HPV VACCINES  Aged Out   COVID-19 Vaccine  Discontinued  Colonoscopy 2023, endoscopy at that time as well, recent one in 2024 - recent sigmoidectomy as above. Prostate: Followed by urology.  Dr. Lafonda Mosses checks labs.  Lab Results  Component Value Date   PSA1 0.5 10/14/2020   PSA 0.76 04/24/2023   PSA 0.58 05/02/2022   PSA 0.65 03/22/2022    Immunization History  Administered Date(s) Administered   Fluzone Influenza virus vaccine,trivalent (IIV3), split virus 08/05/2014, 08/24/2015, 08/23/2019, 08/05/2020   Hepatitis A, Adult 10/26/2013, 06/22/2014, 09/14/2020   Hepatitis B, ADULT 10/26/2013, 03/22/2014, 06/22/2014   Influenza Inj Mdck Quad Pf 09/01/2017, 08/23/2019   Influenza Split 08/27/2012   Influenza,inj,Quad PF,6+ Mos 08/23/2016, 08/23/2020   Influenza-Unspecified 08/20/2015, 08/21/2016, 07/21/2023   Moderna Sars-Covid-2 Vaccination 01/13/2020, 02/10/2020, 09/23/2020, 02/01/2021, 08/25/2021   Pneumococcal Conjugate-13 09/25/2021   Pneumococcal Polysaccharide-23 08/23/2020   Tdap 03/26/2013, 09/14/2020   Zoster Recombinant(Shingrix) 08/06/2019, 08/23/2019, 10/06/2019, 11/05/2019   Zoster, Live 08/23/2019  Prevnar 20 today. PNA vaccine recommended by allergist.   No results found. Plans to schedule visit.   Dental:every 6 mo  Alcohol: 3-4 per month.   Tobacco: none  Exercise: spin 3 times per week, weights few times per week.    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   rotator cuff tear 10/17/2013   Hypogonadism male 09/04/2013   ED (erectile dysfunction) 07/30/2013    Palpitations 02/21/2012   BMI 33.0-33.9,adult 02/04/2012   Allergic rhinitis 02/04/2012   GERD (gastroesophageal reflux disease) 02/04/2012   Migraine 02/04/2012   GAD (generalized anxiety disorder) 02/04/2012   Insomnia 02/04/2012   Essential hypertension 04/13/2009   CHEST PAIN-UNSPECIFIED 04/13/2009   Past Medical History:  Diagnosis Date   Anxiety    Asthma    Complication of anesthesia    oxygen levelds dropped  with colonoscopy   Depression    Dyspnea    sometimes with asthma has sob   GERD (gastroesophageal reflux disease)    Headache    Hyperlipidemia    Phreesia 12/05/2020   Pneumonia    Seasonal allergic reaction    Sleep apnea    Past Surgical History:  Procedure Laterality Date   CIRCUMCISION     COLONOSCOPY     FLEXIBLE SIGMOIDOSCOPY N/A 10/11/2023   Procedure: FLEXIBLE SIGMOIDOSCOPY;  Surgeon: Andria Meuse, MD;  Location: [facility];  Service: General;  Laterality: N/A;   LIPOMA EXCISION N/A 10/30/2018  Procedure: EXCISION OF MULTIPLE SUBCUTANEOUS LIPOMAS ON TORSO;  Surgeon: Manus Rudd, MD;  Location:  SURGERY CENTER;  Service: General;  Laterality: N/A;   XI ROBOTIC ASSISTED LOWER ANTERIOR RESECTION N/A 10/11/2023   Procedure: XI ROBOTIC ASSISTED LOWER ANTERIOR RESECTION, SIGMOIDECTOMY WITH INTRAOPERATIVE ASSESSMENT OF PERFUSION ICG, BILATERAL TAP BLOCK;  Surgeon: Andria Meuse, MD;  Location: [facility];  Service: General;  Laterality: N/A;  180   Allergies  Allergen Reactions   Desloratadine Other (See Comments)    CLARINEX-"severe headache"   Loratadine Other (See Comments)    "severe headache"   Ketek [Telithromycin] Itching and Rash   Levbid [Hyoscyamine Sulfate] Rash   Prior to Admission medications   Medication Sig Start Date End Date Taking? Authorizing Provider  acetaminophen (TYLENOL) 500 MG tablet Take 1,000 mg by mouth every 6 (six) hours as needed for mild pain (pain score 1-3).   Yes [provider]  AIRSUPRA  90-80 MCG/ACT AERO Inhale 2 puffs into the lungs every 4 (four) hours as needed. 03/30/23  Yes [provider]  albuterol (VENTOLIN HFA) 108 (90 Base) MCG/ACT inhaler INHALE 2 PUFFS INTO THE LUNGS EVERY 6 HOURS AS NEEDED FOR WHEEZING OR SHORTNESS OF BREATH Patient not taking: Reported on 10/25/2023 07/19/20   Shade Flood, MD  [facility] ALLERGY 180 MG tablet Take 180 mg by mouth daily. 07/12/20  Yes [provider]  ALPRAZolam Prudy Feeler) 1 MG tablet TAKE 1 TABLET BY MOUTH TWICE DAILY AS NEEDED 09/26/23  Yes Shade Flood, MD  amLODipine (NORVASC) 10 MG tablet Take 10 mg by mouth every evening.   Yes [provider]  anastrozole (ARIMIDEX) 1 MG tablet Take 0.5 mg by mouth once a week.   Yes [provider]  aspirin EC 81 MG tablet Take 1 tablet (81 mg total) by mouth daily. 03/07/15  Yes Tonny Bollman, MD  Budeson-Glycopyrrol-Formoterol (BREZTRI AEROSPHERE) 160-9-4.8 MCG/ACT AERO Inhale 2 puffs into the lungs in the morning and at bedtime. 09/14/21  Yes [provider]  celecoxib (CELEBREX) 200 MG capsule Take 200 mg by mouth 2 (two) times daily as needed for moderate pain (pain score 4-6). PRN 10/23/22  Yes [provider]  diltiazem (CARDIZEM CD) 180 MG 24 hr capsule TAKE 1 CAPSULE(180 MG) BY MOUTH DAILY 04/08/23  Yes Tonny Bollman, MD  diltiazem 2 % GEL Pea sized amount to perianal skin before bowel movement, up to twice per day as needed. 10/01/23  Yes Shade Flood, MD  DUPIXENT 300 MG/2ML prefilled syringe Inject 300 mg into the skin every 14 (fourteen) days. 10/06/21  Yes [provider]  emtricitabine-tenofovir (TRUVADA) 200-300 MG tablet Take 1 tablet by mouth daily. 04/24/23  Yes Shade Flood, MD  esomeprazole (NEXIUM) 40 MG capsule Take 40 mg by mouth in the morning. 11/07/12  Yes [provider]  famotidine (PEPCID) 40 MG tablet Take 40 mg by mouth daily as needed for heartburn or indigestion. 02/18/20  Yes  [provider]  hydrochlorothiazide (HYDRODIURIL) 25 MG tablet Take 25 mg by mouth daily.   Yes [provider]  ibuprofen (ADVIL) 200 MG tablet Take 3 tablets by mouth every 6 (six) hours as needed for mild pain (pain score 1-3).    [provider]  ipratropium (ATROVENT) 0.06 % nasal spray Place 2 sprays into the nose 2 (two) times daily. Patient taking differently: Place 2 sprays into the nose daily as needed for rhinitis. 05/25/21   Shade Flood, MD  ipratropium-albuterol (  DUONEB) 0.5-2.5 (3) MG/3ML SOLN Take 3 mLs by nebulization every 6 (six) hours as needed (Shortness of breath or wheezing). 12/25/21   [provider]  losartan (COZAAR) 25 MG tablet TAKE 1 TABLET(25 MG) BY MOUTH DAILY 04/08/23   Tonny Bollman, MD  metoprolol succinate (TOPROL-XL) 25 MG 24 hr tablet Take 1 tablet (25 mg total) by mouth daily. 04/08/23   Tonny Bollman, MD  montelukast (SINGULAIR) 10 MG tablet TAKE 1 TABLET(10 MG) BY MOUTH AT BEDTIME 01/28/23   Shade Flood, MD  mupirocin ointment (BACTROBAN) 2 % Apply 1 application topically 2 (two) times daily. Patient not taking: Reported on 09/26/2023 11/13/21   Shade Flood, MD  Olopatadine-Mometasone Cristal Generous) [phone] MCG/ACT SUSP Place 1 spray into the nose in the morning and at bedtime.    [provider]  ondansetron (ZOFRAN-ODT) 4 MG disintegrating tablet DISSOLVE 1 TABLET(4 MG) ON THE TONGUE EVERY 8 HOURS AS NEEDED FOR NAUSEA OR VOMITING 08/19/23   Meryl Dare, MD  pantoprazole (PROTONIX) 40 MG tablet TAKE 1 TABLET(40 MG) BY MOUTH TWICE DAILY 10/14/23   Meryl Dare, MD  Potassium Chloride ER 20 MEQ TBCR Take 1 tablet (20 mEq total) by mouth daily. 04/08/23   Tonny Bollman, MD  pramoxine-hydrocortisone (PROCTOCREAM-HC) 1-1 % rectal cream Place rectally 3 (three) times daily. Patient taking differently: Place 1 Application rectally 3 (three) times daily as needed for hemorrhoids or anal itching. 05/07/22    Shade Flood, MD  promethazine-dextromethorphan (PROMETHAZINE-DM) 6.25-15 MG/5ML syrup Take 5 mLs by mouth 3 (three) times daily as needed for cough. 08/23/22   [provider]  rizatriptan (MAXALT-MLT) 10 MG disintegrating tablet DISSOLVE 1 TABLET ON THE TONGUE AS NEEDED FOR  HEADACHE  MAY  REPEAT  IN  2  HOURS  AS  NEEDED 10/23/23   Worthy Rancher B, FNP  rosuvastatin (CRESTOR) 10 MG tablet Take 1 tablet (10 mg total) by mouth daily. 04/08/23   Tonny Bollman, MD  sildenafil (VIAGRA) 100 MG tablet Take 1/2-1 tablet by mouth once daily as needed prior to sexual activity *MUST KEEP UPCOMING APPT* 04/02/23   Tonny Bollman, MD  Spacer/Aero-Holding Chambers DEVI Use with inhaler 05/15/22   Icard, Rachel Bo, DO  tadalafil (CIALIS) 5 MG tablet Take 5 mg by mouth daily. 02/01/23   [provider]  tamsulosin (FLOMAX) 0.4 MG CAPS capsule TAKE 1 CAPSULE(0.4 MG) BY MOUTH DAILY AS NEEDED Patient taking differently: Take 0.4 mg by mouth every evening. 05/20/23   Shade Flood, MD  testosterone cypionate (DEPOTESTOSTERONE CYPIONATE) 200 MG/ML injection Inject 100 mg into the muscle once a week. 07/07/22   [provider]  Vitamin D, Ergocalciferol, (DRISDOL) 1.25 MG (50000 UNIT) CAPS capsule TAKE ONE CAPSULE BY MOUTH EVERY 7 DAYS 08/27/23   Shade Flood, MD  zolpidem (AMBIEN) 10 MG tablet TAKE 1 TABLET(10 MG) BY MOUTH AT BEDTIME 07/22/23   Shade Flood, MD  dicyclomine (BENTYL) 20 MG tablet Take 20 mg by mouth 3 (three) times daily before meals.    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)   Occupation: [employer]  Tobacco Use   Smoking status: Never  Smokeless  tobacco: Never  Vaping Use   Vaping status: Never Used  Substance and Sexual Activity   Alcohol use: Yes    Alcohol/week: 3.0 standard drinks of alcohol    Types: 3 Standard drinks or equivalent per week    Comment: social   Drug use: No   Sexual activity: Yes  Other Topics Concern   Not on file  Social History Narrative   Not on file   Social Drivers of Health   Financial Resource Strain: Not on file  Food Insecurity: No Food Insecurity (10/15/2023)   Hunger Vital Sign    Worried About Running Out of Food in the Last Year: Never true    Ran Out of Food in the Last Year: Never true  Transportation Needs: No Transportation Needs (10/15/2023)   PRAPARE - Administrator, Civil Service (Medical): No    Lack of Transportation (Non-Medical): No  Physical Activity: Sufficiently Active (04/23/2023)   Exercise Vital Sign    Days of Exercise per Week: 5 days    Minutes of Exercise per Session: 90 min  Stress: Stress Concern Present (04/23/2023)   [employer] of Occupational Health - Occupational Stress Questionnaire    Feeling of Stress : To some extent  Social Connections: Unknown (04/23/2023)   Social Connection and Isolation Panel [NHANES]    Frequency of Communication with Friends and Family: More than three times a week    Frequency of Social Gatherings with Friends and Family: Three times a week    Attends Religious Services: More than 4 times per year    Active Member of Clubs or Organizations: Yes    Attends Banker Meetings: 1 to 4 times per year    Marital Status: Patient declined  Intimate Partner Violence: Not At Risk (10/15/2023)   Humiliation, Afraid, Rape, and Kick questionnaire    Fear of Current or Ex-Partner: No    Emotionally Abused: No    Physically Abused: No    Sexually Abused: No    Review of Systems 13 point review of systems per patient health survey noted.  Negative other than as indicated above or in HPI.    Objective:    Vitals:   10/25/23 0819  BP: 116/70  Pulse: 92  Temp: 98.1 F (36.7 C)  TempSrc: Temporal  SpO2: 96%  Weight: 246 lb 6.4 oz (111.8 kg)  Height: 5' 10.5" (1.791 m)   {Vitals History (Optional):23777}  Physical Exam Vitals reviewed.  Constitutional:      Appearance: He is well-developed.  HENT:     Head: Normocephalic and atraumatic.     Right Ear: External ear normal.     Left Ear: External ear normal.  Eyes:     Conjunctiva/sclera: Conjunctivae normal.     Pupils: Pupils are equal, round, and reactive to light.  Neck:     Thyroid: No thyromegaly.  Cardiovascular:     Rate and Rhythm: Normal rate and regular rhythm.     Heart sounds: Normal heart sounds.  Pulmonary:     Effort: Pulmonary effort is normal. No respiratory distress.     Breath sounds: Normal breath sounds. No wheezing.  Abdominal:     General: There is no distension.     Palpations: Abdomen is soft.     Comments: Healing trocar sites and lower abdominal wounds.  No apparent surrounding erythema or exudate.  Musculoskeletal:        General: No tenderness. Normal range of motion.  Cervical back: Normal range of motion and neck supple.  Lymphadenopathy:     Cervical: No cervical adenopathy.  Skin:    General: Skin is warm and dry.  Neurological:     Mental Status: He is alert and oriented to person, place, and time.     Deep Tendon Reflexes: Reflexes are normal and symmetric.  Psychiatric:        Behavior: Behavior normal.        Assessment & Plan:  Sylvestre Drotar. is a 52 y.o. male . Annual physical exam  Allergic rhinitis, unspecified seasonality, unspecified trigger - Plan: montelukast (SINGULAIR) 10 MG tablet  Uncomplicated asthma, unspecified asthma severity, unspecified whether persistent - Plan: montelukast (SINGULAIR) 10 MG tablet  GAD (generalized anxiety disorder) - Plan: ALPRAZolam (XANAX) 1 MG tablet  On pre-exposure prophylaxis for HIV - Plan: emtricitabine-tenofovir  (TRUVADA) 200-300 MG tablet  Psychophysiological insomnia - Plan: zolpidem (AMBIEN) 10 MG tablet  Screening for deficiency anemia - Plan: CBC with Differential/Platelet  Screening for diabetes mellitus - Plan: Comprehensive metabolic panel, Hemoglobin A1c  Screening for hyperlipidemia - Plan: Comprehensive metabolic panel, Lipid panel  Screening examination for STI - Plan: Urine cytology ancillary only, HIV Antibody (routine testing w rflx), RPR  Vitamin D deficiency - Plan: Vitamin D (25 hydroxy)   Meds ordered this encounter  Medications   montelukast (SINGULAIR) 10 MG tablet    Sig: TAKE 1 TABLET(10 MG) BY MOUTH AT BEDTIME    Dispense:  90 tablet    Refill:  3    ZERO refills remain on this prescription. Your patient is requesting advance approval of refills for this medication to PREVENT ANY MISSED DOSES   ALPRAZolam (XANAX) 1 MG tablet    Sig: TAKE 1 TABLET BY MOUTH TWICE DAILY AS NEEDED.    Dispense:  60 tablet    Refill:  0   emtricitabine-tenofovir (TRUVADA) 200-300 MG tablet    Sig: Take 1 tablet by mouth daily.    Dispense:  90 tablet    Refill:  1   zolpidem (AMBIEN) 10 MG tablet    Sig: TAKE 1 TABLET(10 MG) BY MOUTH AT BEDTIME    Dispense:  90 tablet    Refill:  0   doxycycline (VIBRA-TABS) 100 MG tablet    Sig: Take 2 tablets (200 mg total) by mouth once as needed for up to 1 dose. After unprotected intercourse.    Dispense:  2 tablet    Refill:  11   Patient Instructions  Doxycycline 2 pills after unprotected intercourse. No other med changes at this time.  If any concerns on labs I will let you know.  Take care!  Preventive Care 1-44 Years Old, Male Preventive care refers to lifestyle choices and visits with your health care provider that can promote health and wellness. Preventive care visits are also called wellness exams. What can I expect for my preventive care visit? Counseling During your preventive care visit, your health care provider may ask  about your: Medical history, including: Past medical problems. Family medical history. Current health, including: Emotional well-being. Home life and relationship well-being. Sexual activity. Lifestyle, including: Alcohol, nicotine or tobacco, and drug use. Access to firearms. Diet, exercise, and sleep habits. Safety issues such as seatbelt and bike helmet use. Sunscreen use. Work and work Astronomer. Physical exam Your health care provider will check your: Height and weight. These may be used to calculate your BMI (body mass index). BMI is a measurement that tells if you  are at a healthy weight. Waist circumference. This measures the distance around your waistline. This measurement also tells if you are at a healthy weight and may help predict your risk of certain diseases, such as type 2 diabetes and high blood pressure. Heart rate and blood pressure. Body temperature. Skin for abnormal spots. What immunizations do I need?  Vaccines are usually given at various ages, according to a schedule. Your health care provider will recommend vaccines for you based on your age, medical history, and lifestyle or other factors, such as travel or where you work. What tests do I need? Screening Your health care provider may recommend screening tests for certain conditions. This may include: Lipid and cholesterol levels. Diabetes screening. This is done by checking your blood sugar (glucose) after you have not eaten for a while (fasting). Hepatitis B test. Hepatitis C test. HIV (human immunodeficiency virus) test. STI (sexually transmitted infection) testing, if you are at risk. Lung cancer screening. Prostate cancer screening. Colorectal cancer screening. Talk with your health care provider about your test results, treatment options, and if necessary, the need for more tests. Follow these instructions at home: Eating and drinking  Eat a diet that includes fresh fruits and vegetables,  whole grains, lean protein, and low-fat dairy products. Take vitamin and mineral supplements as recommended by your health care provider. Do not drink alcohol if your health care provider tells you not to drink. If you drink alcohol: Limit how much you have to 0-2 drinks a day. Know how much alcohol is in your drink. In the [place] one drink equals one 12 oz bottle of beer (355 mL), one 5 oz glass of wine (148 mL), or one 1 oz glass of hard liquor (44 mL). Lifestyle Brush your teeth every morning and night with fluoride toothpaste. Floss one time each day. Exercise for at least 30 minutes 5 or more days each week. Do not use any products that contain nicotine or tobacco. These products include cigarettes, chewing tobacco, and vaping devices, such as e-cigarettes. If you need help quitting, ask your health care provider. Do not use drugs. If you are sexually active, practice safe sex. Use a condom or other form of protection to prevent STIs. Take aspirin only as told by your health care provider. Make sure that you understand how much to take and what form to take. Work with your health care provider to find out whether it is safe and beneficial for you to take aspirin daily. Find healthy ways to manage stress, such as: Meditation, yoga, or listening to music. Journaling. Talking to a trusted person. Spending time with friends and family. Minimize exposure to UV radiation to reduce your risk of skin cancer. Safety Always wear your seat belt while driving or riding in a vehicle. Do not drive: If you have been drinking alcohol. Do not ride with someone who has been drinking. When you are tired or distracted. While texting. If you have been using any mind-altering substances or drugs. Wear a helmet and other protective equipment during sports activities. If you have firearms in your house, make sure you follow all gun safety procedures. What's next? Go to your health care provider once a year  for an annual wellness visit. Ask your health care provider how often you should have your eyes and teeth checked. Stay up to date on all vaccines. This information is not intended to replace advice given to you by your health care provider. Make sure you discuss any questions  you have with your health care provider. Document Revised: 04/19/2021 Document Reviewed: 04/19/2021 Elsevier Patient Education  2024 Elsevier Inc.     Signed,   Meredith Staggers, MD [facility], [facility] 10/25/23 8:51 AM

## 2023-10-26 ENCOUNTER — Encounter: Payer: Self-pay | Admitting: Family Medicine

## 2023-10-27 LAB — RPR: RPR Ser Ql: NONREACTIVE

## 2023-10-27 LAB — HIV ANTIBODY (ROUTINE TESTING W REFLEX): HIV 1&2 Ab, 4th Generation: NONREACTIVE

## 2023-10-29 ENCOUNTER — Encounter: Payer: Self-pay | Admitting: Family Medicine

## 2023-10-29 LAB — URINE CYTOLOGY ANCILLARY ONLY
Chlamydia: NEGATIVE
Comment: NEGATIVE
Comment: NEGATIVE
Comment: NORMAL
Neisseria Gonorrhea: NEGATIVE
Trichomonas: NEGATIVE

## 2023-10-31 MED ORDER — DOXYCYCLINE HYCLATE 100 MG PO TABS: 200.0000 mg | ORAL_TABLET | Freq: Once | ORAL | 11 refills | Status: DC | PRN

## 2023-11-04 ENCOUNTER — Other Ambulatory Visit: Payer: Self-pay | Admitting: Family Medicine

## 2023-11-04 DIAGNOSIS — J3089 Other allergic rhinitis: Secondary | ICD-10-CM | POA: Diagnosis not present

## 2023-11-04 DIAGNOSIS — Z79899 Other long term (current) drug therapy: Secondary | ICD-10-CM

## 2023-11-04 DIAGNOSIS — J301 Allergic rhinitis due to pollen: Secondary | ICD-10-CM | POA: Diagnosis not present

## 2023-11-15 ENCOUNTER — Ambulatory Visit: Payer: Self-pay | Admitting: Family Medicine

## 2023-11-15 DIAGNOSIS — S30821A Blister (nonthermal) of abdominal wall, initial encounter: Secondary | ICD-10-CM

## 2023-11-15 MED ORDER — VALACYCLOVIR HCL 1 G PO TABS: 1000.0000 mg | ORAL_TABLET | Freq: Three times a day (TID) | ORAL | 0 refills | Status: DC

## 2023-11-15 NOTE — Telephone Encounter (Signed)
 FYI  pt has appointment Monday

## 2023-11-15 NOTE — Telephone Encounter (Signed)
 Called pt - 2 blisters - 1 large blister, 1 smaller blister. Noticed this morning.  Below umbilicus on right side. Small blister near it.  Area around it feels irritated - in are where hir regroth after shaved skin from surgery. He sent message to derm - bulla. Had prior - thought to be insect bites. Yellow blister, no pus or blood from area.  Has kept covered.similar sx's in other areas in past. On prednisone  and augmentin  from allergist for sinus infection.  No radiation of redness, skin irritation, or other rash in R side or flank.     Keep appt on Monday.  Will send Valtrex  to his pharmacy to fill if any spreading redness or other vesicular rash in same dermatome.  Advised to start Valtrex  prior to his appointment on Monday if that occurs.  Urgent care precautions over the weekend if any worsening of current blisters.

## 2023-11-15 NOTE — Addendum Note (Signed)
 Addended by: Meredith Staggers R on: 11/15/2023 05:39 PM   Modules accepted: Orders

## 2023-11-15 NOTE — Telephone Encounter (Signed)
 Copied from CRM [id number]. Topic: Clinical - Pink Word Triage >> Nov 15, 2023 11:55 AM Joanell B wrote: Reason for Triage: Pt stated that he has two blisters abdulas the size of a quater on his abdomin raised with fluid in them.   Chief Complaint: Blisters with rash on abdomen Frequency: Started since last night Pertinent Negatives: Patient denies fever Disposition: [] ED /[] Urgent Care (no appt availability in office) / [x] Appointment(In office/virtual)/ []  [facility]/ [] Home Care/ [] Refused Recommended Disposition /[] [facility]/ []  Follow-up with PCP  Additional Notes: Patient has 2 blisters on lower abdomen with redness around it that looks like a rash. He denies itching, pain, fever or other symptoms. There is a little tenderness. Appointment scheduled for Monday 1/13.   Reason for Disposition  Mild widespread rash  (Exception: Heat rash lasting 3 days or less.)  Answer Assessment - Initial Assessment Questions 1. APPEARANCE of BLISTER: What does it look like?     2 blisters, redness with rash around it  2. SIZE: How large is the blister? (inches, cm or compare to coins)     Size of quarter  3. LOCATION: Where are the blisters located?      On abdomen below belly button   4. WHEN: When did the blister happen?     Overnight  5. CAUSE: What do you think caused the blister?     Unknown. Patient stated he also had this in the past  6. PAIN: Does it hurt? If Yes, ask: How bad is the pain?  (Scale 1-10; or mild, moderate, severe)     Just tenderness, not itching or painful  Answer Assessment - Initial Assessment Questions 1. APPEARANCE of RASH: Describe the rash. (e.g., spots, blisters, raised areas, skin peeling, scaly)    Redness, 2 Blisters  2. SIZE: How big are the spots? (e.g., tip of pen, eraser, coin; inches, centimeters)     Size of quarter  3. LOCATION: Where is the rash located?     Lower abdomen, below belly button  4.  FEVER: Do you have a fever? If Yes, ask: What is your temperature, how was it measured, and when did it start?     No, does not feel feverish  5. ITCHING: Does the rash itch? If Yes, ask: How bad is the itch? (Scale 1-10; or mild, moderate, severe)     No  6. CAUSE: What do you think is causing the rash?     Unknown  7. MEDICINE FACTORS: Have you started any new medicines within the last 2 weeks? (e.g., antibiotics)      Patient stated he has recently been treated for sinus infection, but he does not think that it is related because he had these symptoms in the past even when not taking medication  8. OTHER SYMPTOMS: Do you have any other symptoms? (e.g., dizziness, headache, sore throat, joint pain)       No  Protocols used: Blister - Foot and Hand-A-AH, Rash or Redness - [facility]

## 2023-11-18 ENCOUNTER — Ambulatory Visit: Payer: BC Managed Care – PPO | Admitting: Family Medicine

## 2023-11-18 VITALS — BP 128/70 | HR 90 | Temp 98.7°F | Ht 70.5 in | Wt 245.2 lb

## 2023-11-18 DIAGNOSIS — J45909 Unspecified asthma, uncomplicated: Secondary | ICD-10-CM | POA: Diagnosis not present

## 2023-11-18 DIAGNOSIS — R059 Cough, unspecified: Secondary | ICD-10-CM

## 2023-11-18 DIAGNOSIS — R238 Other skin changes: Secondary | ICD-10-CM

## 2023-11-18 DIAGNOSIS — J0100 Acute maxillary sinusitis, unspecified: Secondary | ICD-10-CM | POA: Diagnosis not present

## 2023-11-18 MED ORDER — PREDNISONE 20 MG PO TABS: ORAL_TABLET | ORAL | 0 refills | Status: DC

## 2023-11-18 NOTE — Progress Notes (Signed)
 Subjective:  Patient ID: Randall Reed., male    DOB: 1971/04/12  Age: 53 y.o. MRN: [id number]  CC:  Chief Complaint  Patient presents with   Rash    Pt notes he has a blister on his chest starting Friday, notes had more pop up through Friday and more on Saturday, notes had a call with Dr Randall and started med that was Rx Saturday and still present    Nasal Congestion    Allergist put patient on Augmentin  and prednisone  for a head cold for 6 days     HPI Randall Reed. presents for  Acute visit for blister on abdominal wall.  See prior telephone message 3 days ago.  Previous shave skin and area from prior surgery.  2 blisters total of and discussed on Friday, 1 large blister and 1 smaller blister nearby.  He had mentioned it to a contact of his who is a armed forces operational officer and diagnosed with a bulla. I sent in valtrex  if worsening for possible shingles.  Started 4 days ago.  1st lesion increased in size, as did smaller one. New blister 2 days ago next to others. He started the valtrex  2 days ago. No new side effects.  Some soreness in mid back, but no rash on back or radiating to pain to flank.   Some itching of areas. Large one has flattened overnight.   No new belt or clothing that touches area.  Tx: valtrex  as above.   Has had blisters in that area or hip at times in the past - usually treated with bandage or neosporin.    Additionally he did note that he was started on Augmentin  and prednisone  by his allergist for sinus infection 7 days ago - sinus congestion, cough. 3 days of 40mg , 4 days of 20mg  - on last day. . Dr. Rosan with allergy [facility].  Feels more congestion in nose, some coughing episodes. Slight shortness of breath. Used rescue inhaler more yesterday - few times.  No fever.  Drinking fluids.  No chest pain or confusion.     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/04/2014   rotator cuff tear 10/17/2013   Hypogonadism male 09/04/2013   ED (erectile dysfunction) 07/30/2013   Palpitations 02/21/2012   BMI 33.0-33.9,adult 02/04/2012   Allergic rhinitis 02/04/2012   GERD (gastroesophageal reflux disease) 02/04/2012   Migraine 02/04/2012   GAD (generalized anxiety disorder) 02/04/2012   Insomnia 02/04/2012   Essential hypertension 04/13/2009   CHEST PAIN-UNSPECIFIED 04/13/2009   Past Medical History:  Diagnosis Date   Anxiety    Asthma    Complication of anesthesia    oxygen levelds dropped  with colonoscopy   Depression    Dyspnea    sometimes with asthma has sob   GERD (gastroesophageal reflux disease)    Headache    Hyperlipidemia    Phreesia 12/05/2020   Pneumonia    Seasonal allergic reaction    Sleep apnea    Past Surgical History:  Procedure Laterality Date   CIRCUMCISION     COLONOSCOPY     FLEXIBLE SIGMOIDOSCOPY N/A 10/11/2023   Procedure: FLEXIBLE SIGMOIDOSCOPY;  Surgeon: Teresa Lonni HERO, MD;  Location: [facility];  Service: General;  Laterality: N/A;  LIPOMA EXCISION N/A 10/30/2018   Procedure: EXCISION OF MULTIPLE SUBCUTANEOUS LIPOMAS ON TORSO;  Surgeon: Belinda Cough, MD;  Location: [facility];  Service: General;  Laterality: N/A;   XI ROBOTIC ASSISTED LOWER ANTERIOR RESECTION N/A 10/11/2023   Procedure: XI ROBOTIC ASSISTED LOWER ANTERIOR RESECTION, SIGMOIDECTOMY WITH INTRAOPERATIVE ASSESSMENT OF PERFUSION ICG, BILATERAL TAP BLOCK;  Surgeon: Teresa Lonni HERO, MD;  Location: [facility];  Service: General;  Laterality: N/A;  180   Allergies  Allergen Reactions   Desloratadine Other (See Comments)    CLARINEX-severe headache   Loratadine Other (See Comments)    severe headache   Ketek [Telithromycin]  Itching and Rash   Levbid  [Hyoscyamine  Sulfate] Rash   Prior to Admission medications   Medication Sig Start Date End Date Taking? Authorizing Provider  acetaminophen  (TYLENOL ) 500 MG tablet Take 1,000 mg by mouth every 6 (six) hours as needed for mild pain (pain score 1-3).    [provider]  AIRSUPRA  90-80 MCG/ACT AERO Inhale 2 puffs into the lungs every 4 (four) hours as needed. 03/30/23   [provider]  albuterol  (VENTOLIN  HFA) 108 (90 Base) MCG/ACT inhaler INHALE 2 PUFFS INTO THE LUNGS EVERY 6 HOURS AS NEEDED FOR WHEEZING OR SHORTNESS OF BREATH Patient not taking: Reported on 10/25/2023 07/19/20   Randall Reyes SAUNDERS, MD  [facility] ALLERGY 180 MG tablet Take 180 mg by mouth daily. 07/12/20   [provider]  ALPRAZolam  (XANAX ) 1 MG tablet TAKE 1 TABLET BY MOUTH TWICE DAILY AS NEEDED. 10/25/23   Randall Reyes SAUNDERS, MD  amLODipine  (NORVASC ) 10 MG tablet Take 10 mg by mouth every evening.    [provider]  anastrozole (ARIMIDEX) 1 MG tablet Take 0.5 mg by mouth once a week.    [provider]  aspirin  EC 81 MG tablet Take 1 tablet (81 mg total) by mouth daily. 03/07/15   Wonda Sharper, MD  Budeson-Glycopyrrol-Formoterol  (BREZTRI AEROSPHERE) 160-9-4.8 MCG/ACT AERO Inhale 2 puffs into the lungs in the morning and at bedtime. 09/14/21   [provider]  celecoxib (CELEBREX) 200 MG capsule Take 200 mg by mouth 2 (two) times daily as needed for moderate pain (pain score 4-6). PRN 10/23/22   [provider]  diltiazem  (CARDIZEM  CD) 180 MG 24 hr capsule TAKE 1 CAPSULE(180 MG) BY MOUTH DAILY 04/08/23   Wonda Sharper, MD  diltiazem  2 % GEL Pea sized amount to perianal skin before bowel movement, up to twice per day as needed. 10/01/23   Randall Reyes SAUNDERS, MD  doxycycline  (VIBRA -TABS) 100 MG tablet Take 2 tablets (200 mg total) by mouth once as needed for up to 1 dose. After unprotected intercourse. 10/31/23   Randall Reyes SAUNDERS, MD  DUPIXENT 300  MG/2ML prefilled syringe Inject 300 mg into the skin every 14 (fourteen) days. 10/06/21   [provider]  emtricitabine -tenofovir  (TRUVADA ) 200-300 MG tablet Take 1 tablet by mouth daily. 10/25/23   Randall Reyes SAUNDERS, MD  esomeprazole  (NEXIUM ) 40 MG capsule Take 40 mg by mouth in the morning. 11/07/12   [provider]  famotidine (PEPCID) 40 MG tablet Take 40 mg by mouth daily as needed for heartburn or indigestion. 02/18/20   [provider]  hydrochlorothiazide  (HYDRODIURIL ) 25 MG tablet Take 25 mg by mouth daily.    [provider]  ibuprofen  (ADVIL ) 200 MG tablet Take 3 tablets by mouth every 6 (six) hours as needed for mild pain (pain score 1-3).    [provider]  ipratropium (  ATROVENT ) 0.06 % nasal spray Place 2 sprays into the nose 2 (two) times daily. Patient taking differently: Place 2 sprays into the nose daily as needed for rhinitis. 05/25/21   Randall Reyes SAUNDERS, MD  ipratropium-albuterol  (DUONEB) 0.5-2.5 (3) MG/3ML SOLN Take 3 mLs by nebulization every 6 (six) hours as needed (Shortness of breath or wheezing). 12/25/21   [provider]  losartan  (COZAAR ) 25 MG tablet TAKE 1 TABLET(25 MG) BY MOUTH DAILY 04/08/23   Wonda Sharper, MD  metoprolol  succinate (TOPROL -XL) 25 MG 24 hr tablet Take 1 tablet (25 mg total) by mouth daily. 04/08/23   Wonda Sharper, MD  montelukast  (SINGULAIR ) 10 MG tablet TAKE 1 TABLET(10 MG) BY MOUTH AT BEDTIME 10/25/23   Randall Reyes SAUNDERS, MD  Olopatadine -Mometasone  ([name]) 665-25 MCG/ACT SUSP Place 1 spray into the nose in the morning and at bedtime.    [provider]  ondansetron  (ZOFRAN -ODT) 4 MG disintegrating tablet DISSOLVE 1 TABLET(4 MG) ON THE TONGUE EVERY 8 HOURS AS NEEDED FOR NAUSEA OR VOMITING 08/19/23   Aneita Gwendlyn DASEN, MD  pantoprazole  (PROTONIX ) 40 MG tablet TAKE 1 TABLET(40 MG) BY MOUTH TWICE DAILY 10/14/23   Aneita Gwendlyn DASEN, MD  Potassium Chloride  ER 20 MEQ TBCR Take 1 tablet (20 mEq  total) by mouth daily. 04/08/23   Wonda Sharper, MD  pramoxine-hydrocortisone  (PROCTOCREAM-HC) 1-1 % rectal cream Place rectally 3 (three) times daily. Patient not taking: Reported on 10/25/2023 05/07/22   Randall Reyes SAUNDERS, MD  rizatriptan  (MAXALT -MLT) 10 MG disintegrating tablet DISSOLVE 1 TABLET ON THE TONGUE AS NEEDED FOR  HEADACHE  MAY  REPEAT  IN  2  HOURS  AS  NEEDED 10/23/23   Webb, Padonda B, FNP  rosuvastatin  (CRESTOR ) 10 MG tablet Take 1 tablet (10 mg total) by mouth daily. 04/08/23   Wonda Sharper, MD  sildenafil  (VIAGRA ) 100 MG tablet Take 1/2-1 tablet by mouth once daily as needed prior to sexual activity *MUST KEEP UPCOMING APPT* 04/02/23   Wonda Sharper, MD  Spacer/Aero-Holding Chambers DEVI Use with inhaler 05/15/22   Icard, Adine CROME, DO  tadalafil  (CIALIS ) 5 MG tablet Take 5 mg by mouth daily. 02/01/23   [provider]  tamsulosin  (FLOMAX ) 0.4 MG CAPS capsule TAKE 1 CAPSULE(0.4 MG) BY MOUTH DAILY AS NEEDED Patient taking differently: Take 0.4 mg by mouth every evening. 05/20/23   Randall Reyes SAUNDERS, MD  testosterone  cypionate (DEPOTESTOSTERONE CYPIONATE) 200 MG/ML injection Inject 100 mg into the muscle once a week. 07/07/22   [provider]  valACYclovir  (VALTREX ) 1000 MG tablet Take 1 tablet (1,000 mg total) by mouth 3 (three) times daily. 11/15/23   Randall Reyes SAUNDERS, MD  Vitamin D , Ergocalciferol , (DRISDOL ) 1.25 MG (50000 UNIT) CAPS capsule TAKE ONE CAPSULE BY MOUTH EVERY 7 DAYS 08/27/23   Randall Reyes SAUNDERS, MD  zolpidem  (AMBIEN ) 10 MG tablet TAKE 1 TABLET(10 MG) BY MOUTH AT BEDTIME 10/25/23   Randall Reyes SAUNDERS, MD  dicyclomine  (BENTYL ) 20 MG tablet Take 20 mg by mouth 3 (three) times daily before meals.    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)   Occupation: [employer]  Tobacco Use   Smoking status: Never  Smokeless tobacco: Never  Vaping Use   Vaping status: Never Used  Substance and Sexual Activity   Alcohol use: Yes    Alcohol/week: 3.0 standard drinks of alcohol    Types: 3 Standard drinks or equivalent per week    Comment: social   Drug use: No   Sexual activity: Yes  Other Topics Concern   Not on file  Social History Narrative   Not on file   Social Drivers of Health   Financial Resource Strain: Low Risk  (11/16/2023)   Overall Financial Resource Strain (CARDIA)    Difficulty of Paying Living Expenses: Not very hard  Food Insecurity: No Food Insecurity (11/16/2023)   Hunger Vital Sign    Worried About Running Out of Food in the Last Year: Never true    Ran Out of Food in the Last Year: Never true  Transportation Needs: No Transportation Needs (11/16/2023)   PRAPARE - Administrator, Civil Service (Medical): No    Lack of Transportation (Non-Medical): No  Physical Activity: Sufficiently Active (11/16/2023)   Exercise Vital Sign    Days of Exercise per Week: 5 days    Minutes of Exercise per Session: 60 min  Stress: No Stress Concern Present (11/16/2023)   [employer] of Occupational Health - Occupational Stress Questionnaire    Feeling of Stress : Only a little  Social Connections: Moderately Integrated (11/16/2023)   Social Connection and Isolation Panel [NHANES]    Frequency of Communication with Friends and Family: More than three times a week    Frequency of Social Gatherings with Friends and Family: Once a week    Attends Religious Services: More than 4 times per year    Active Member of [employer] or Organizations: Yes    Attends Banker Meetings: 1 to 4 times per year    Marital Status: Never married  Intimate Partner Violence: Not At Risk (10/15/2023)   Humiliation, Afraid, Rape, and Kick  questionnaire    Fear of Current or Ex-Partner: No    Emotionally Abused: No    Physically Abused: No    Sexually Abused: No    Review of Systems   Objective:   Vitals:   11/18/23 1110  BP: 128/70  Pulse: 90  Temp: 98.7 F (37.1 C)  TempSrc: Temporal  SpO2: 98%  Weight: 245 lb 3.2 oz (111.2 kg)  Height: 5' 10.5 (1.791 m)     Physical Exam Vitals reviewed.  Constitutional:      Appearance: He is well-developed.  HENT:     Head: Normocephalic and atraumatic.     Right Ear: Tympanic membrane, ear canal and external ear normal.     Left Ear: Tympanic membrane, ear canal and external ear normal.     Nose: No rhinorrhea.     Comments: Sinus tenderness.,  Maxillary sinuses bilaterally.  Frontal nontender.    Mouth/Throat:     Pharynx: No oropharyngeal exudate or posterior oropharyngeal erythema.  Eyes:     Conjunctiva/sclera: Conjunctivae normal.     Pupils: Pupils are equal, round, and reactive to light.  Cardiovascular:     Rate and Rhythm: Normal rate and regular rhythm.     Heart sounds: Normal heart sounds. No murmur heard. Pulmonary:     Effort: Pulmonary effort is normal.     Breath sounds: Normal breath sounds. No wheezing, rhonchi or rales.  Abdominal:     Palpations: Abdomen is soft.     Tenderness: There is no abdominal tenderness.  Musculoskeletal:     Cervical back: Neck supple.  Lymphadenopathy:     Cervical: No cervical adenopathy.  Skin:    General: Skin is warm and dry.     Comments: Bullous appearing lesions flat, lower abdominal wall, see photo.  No other lesions seen along the right flank or back.  No skin dysesthesias in that dermatome.  Neurological:     Mental Status: He is alert and oriented to person, place, and time.  Psychiatric:        Behavior: Behavior normal.         Assessment & Plan:  Sumner Kirchman. is a 53 y.o. male . Bullous lesion - Plan: Ambulatory referral to Dermatology  -Recurrent with similar lesions in the  past.  Question contact dermatitis versus less likely shingles, but he is on Valtrex  1 g dosing few days.  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  Does not appear to have infectious component at this time.  No other areas of involvement, no current illness.  Will continue to monitor and refer to dermatology for discussion of recurrent symptoms and treatment.  RTC precautions if progressive lesions or new symptoms.  Cough, unspecified type - Plan: predniSONE  (DELTASONE ) 20 MG tablet, DG Chest 2 View Uncomplicated asthma, unspecified asthma severity, unspecified whether persistent - Plan: predniSONE  (DELTASONE ) 20 MG tablet Acute maxillary sinusitis, recurrence not specified  -Increased cough, component of asthma flare likely with underlying sinusitis.  Improving at higher dose of steroid, will restart at higher dose prednisone , potential side effects discussed, taper prednisone  and continue current antibiotic from allergist.  ER/RTC precautions given.  Consider chest x-ray if not improving.  Meds ordered this encounter  Medications   predniSONE  (DELTASONE ) 20 MG tablet    Sig: 2 by mouth for 2 days, then 1 by mouth for 2 days, then 1/2 by mouth for 2 days.    Dispense:  16 tablet    Refill:  0   Patient Instructions  Blisters appear to be flat and healing.  Okay to stay on Valtrex  in case this could have been shingles or HSV virus.  I do not see any sign of an infection in the skin or cellulitis or need for additional antibiotics at this time.  Over-the-counter hydrocortisone  cream can be used if you do have some itching.  Sometimes you can have a contact dermatitis or irritation of the skin from something that has touched they can cause irritation in this manner.  I expect symptoms to continue to improve, but if you have progression of symptoms, new rash or worsening be seen by medical provider.  I will refer you to dermatology as you have experienced these areas previously.  Cough may be  related to flare of asthma as well as some of the drainage from your sinus symptoms.  I do not see a reason for additional antibiotics at this time.  Try extension of prednisone , back to 40 mg for few days, then taper as we discussed.  If cough is not improving in the next few days, chest x-ray was ordered.  Be seen if any new or worsening symptoms including more shortness of breath or fever.  Hope you feel better soon.  Thanks for coming in today.  [facility] or xray: Walk in 8:30-4:30 during weekdays, no appointment needed [address]  [place], [place] [id number]     Signed,   Reyes Pines, MD [facility], [facility] 11/18/23 11:38 AM

## 2023-11-18 NOTE — Patient Instructions (Addendum)
 Blisters appear to be flat and healing.  Okay to stay on Valtrex  in case this could have been shingles or HSV virus.  I do not see any sign of an infection in the skin or cellulitis or need for additional antibiotics at this time.  Over-the-counter hydrocortisone  cream can be used if you do have some itching.  Sometimes you can have a contact dermatitis or irritation of the skin from something that has touched they can cause irritation in this manner.  I expect symptoms to continue to improve, but if you have progression of symptoms, new rash or worsening be seen by medical provider.  I will refer you to dermatology as you have experienced these areas previously.  Cough may be related to flare of asthma as well as some of the drainage from your sinus symptoms.  I do not see a reason for additional antibiotics at this time.  Try extension of prednisone , back to 40 mg for few days, then taper as we discussed.  If cough is not improving in the next few days, chest x-ray was ordered.  Be seen if any new or worsening symptoms including more shortness of breath or fever.  Hope you feel better soon.  Thanks for coming in today.  [facility] or xray: Walk in 8:30-4:30 during weekdays, no appointment needed [address]  [place], [place] [address]

## 2023-11-19 ENCOUNTER — Encounter: Payer: Self-pay | Admitting: Family Medicine

## 2023-11-21 ENCOUNTER — Encounter: Payer: Self-pay | Admitting: Family Medicine

## 2023-11-26 ENCOUNTER — Other Ambulatory Visit: Payer: Self-pay | Admitting: Family

## 2023-11-26 DIAGNOSIS — G43109 Migraine with aura, not intractable, without status migrainosus: Secondary | ICD-10-CM

## 2023-11-27 DIAGNOSIS — J3089 Other allergic rhinitis: Secondary | ICD-10-CM | POA: Diagnosis not present

## 2023-11-27 DIAGNOSIS — J301 Allergic rhinitis due to pollen: Secondary | ICD-10-CM | POA: Diagnosis not present

## 2023-11-28 ENCOUNTER — Other Ambulatory Visit: Payer: Self-pay | Admitting: Family Medicine

## 2023-11-28 DIAGNOSIS — F411 Generalized anxiety disorder: Secondary | ICD-10-CM

## 2023-11-28 NOTE — Telephone Encounter (Signed)
Controlled substance database reviewed.  Alprazolam 1 mg #60 last filled on 10/27/2023.  Medications discussed in December.  Refill ordered.

## 2023-11-29 ENCOUNTER — Other Ambulatory Visit: Payer: Self-pay | Admitting: Family Medicine

## 2023-11-29 DIAGNOSIS — R7989 Other specified abnormal findings of blood chemistry: Secondary | ICD-10-CM

## 2023-11-29 NOTE — Telephone Encounter (Signed)
Allergist cannot see him till April and prednisone is done in the next day or so

## 2023-11-29 NOTE — Telephone Encounter (Signed)
Sorry to hear about the delay with allergist/ENT.  Looks like I have a few openings on Monday, can we try to get him in for an appointment?  If worsening over the weekend he does have the option to see urgent care.

## 2023-12-02 ENCOUNTER — Ambulatory Visit (INDEPENDENT_AMBULATORY_CARE_PROVIDER_SITE_OTHER)
Admission: RE | Admit: 2023-12-02 | Discharge: 2023-12-02 | Disposition: A | Discharge status: Home / Self Care | Payer: BC Managed Care – PPO | Source: Ambulatory Visit | Attending: Family Medicine | Admitting: Family Medicine

## 2023-12-02 DIAGNOSIS — E291 Testicular hypofunction: Secondary | ICD-10-CM | POA: Diagnosis not present

## 2023-12-02 DIAGNOSIS — R102 Pelvic and perineal pain: Secondary | ICD-10-CM | POA: Diagnosis not present

## 2023-12-02 DIAGNOSIS — N529 Male erectile dysfunction, unspecified: Secondary | ICD-10-CM | POA: Diagnosis not present

## 2023-12-02 DIAGNOSIS — R059 Cough, unspecified: Secondary | ICD-10-CM

## 2023-12-02 DIAGNOSIS — Z125 Encounter for screening for malignant neoplasm of prostate: Secondary | ICD-10-CM | POA: Diagnosis not present

## 2023-12-02 DIAGNOSIS — N475 Adhesions of prepuce and glans penis: Secondary | ICD-10-CM | POA: Diagnosis not present

## 2023-12-03 ENCOUNTER — Encounter: Payer: Self-pay | Admitting: Family Medicine

## 2023-12-04 DIAGNOSIS — J3089 Other allergic rhinitis: Secondary | ICD-10-CM | POA: Diagnosis not present

## 2023-12-04 DIAGNOSIS — J301 Allergic rhinitis due to pollen: Secondary | ICD-10-CM | POA: Diagnosis not present

## 2023-12-05 ENCOUNTER — Ambulatory Visit: Payer: BC Managed Care – PPO | Admitting: Family Medicine

## 2023-12-05 ENCOUNTER — Other Ambulatory Visit (HOSPITAL_COMMUNITY): Payer: Self-pay

## 2023-12-05 VITALS — BP 120/78 | HR 80 | Temp 98.7°F

## 2023-12-05 DIAGNOSIS — J0100 Acute maxillary sinusitis, unspecified: Secondary | ICD-10-CM

## 2023-12-05 DIAGNOSIS — J382 Nodules of vocal cords: Secondary | ICD-10-CM

## 2023-12-05 DIAGNOSIS — R059 Cough, unspecified: Secondary | ICD-10-CM

## 2023-12-05 DIAGNOSIS — J329 Chronic sinusitis, unspecified: Secondary | ICD-10-CM | POA: Diagnosis not present

## 2023-12-05 DIAGNOSIS — J45909 Unspecified asthma, uncomplicated: Secondary | ICD-10-CM

## 2023-12-05 DIAGNOSIS — G43109 Migraine with aura, not intractable, without status migrainosus: Secondary | ICD-10-CM

## 2023-12-05 LAB — POC COVID19 BINAXNOW: SARS Coronavirus 2 Ag: NEGATIVE

## 2023-12-05 LAB — POC INFLUENZA A&B (BINAX/QUICKVUE)
Influenza A, POC: NEGATIVE
Influenza B, POC: NEGATIVE

## 2023-12-05 LAB — POCT RESPIRATORY SYNCYTIAL VIRUS: RSV Rapid Ag: NEGATIVE

## 2023-12-05 MED ORDER — PREDNISONE 20 MG PO TABS: ORAL_TABLET | ORAL | 0 refills | Status: DC

## 2023-12-05 MED ORDER — RIZATRIPTAN BENZOATE 10 MG PO TBDP: ORAL_TABLET | ORAL | 0 refills | Status: DC

## 2023-12-05 MED ORDER — AMOXICILLIN-POT CLAVULANATE 875-125 MG PO TABS: 1.0000 | ORAL_TABLET | Freq: Two times a day (BID) | ORAL | 0 refills | Status: DC

## 2023-12-05 NOTE — Patient Instructions (Signed)
COVID, flu, RSV testing was negative in the office.  With worsening chest symptoms and asthma flare as well as persistent sinus symptoms we can try repeating prednisone and Augmentin.  I expect symptoms to improve within the next week.  It can take a while for the antibiotics to treat sinus infections.  No other medication changes at this time.  Follow-up if any worsening symptoms.  I did place a referral to ENT as we discussed.  Hope you feel better soon.

## 2023-12-05 NOTE — Progress Notes (Signed)
Subjective:  Patient ID: Randall Leber., male    DOB: Feb 01, 1971  Age: 53 y.o. MRN: [id number]  CC:  Chief Complaint  Patient presents with   Nasal Congestion    Pt notes congestion, drainage, fatigue, asthma attacks with productive cough, started beginning of January     HPI Randall Reed. presents for   Cough, nasal congestion, sinus congestion, asthma flare See prior notes.  Initially discussed at 11/18/2023 visit.  Had been started on Augmentin and prednisone by his allergist 1 week prior for sinus congestion and cough.  Prednisone treatment of 40 mg daily x 3 days, then 20 mg daily x 4 days.  He was in his last day of 20 mg dose prednisone at his January 13 visit.  He was having more nasal congestion with some coughing episodes and slight shortness of breath, increased use of albuterol.  Suspected component of asthma flare and underlying sinusitis.  Some improvement at higher dose of steroid initially, so prednisone was extended with higher dosing initially at 40 mg x 2 days, then 20 mg x 2 days, then 10 mg x 2 days. Some initial improvement in asthma symptoms  then worsened few days ago as below.   MyChart message 3 days later on 1/16  still persistent nasal congestion and sinus pressure, postnasal drip, minimal relief with Sudafed D.  No worsening cough at that time so chest x-ray was deferred.  He had been using his Allegra, Singulair and Dupixent as well as [name] and ipratropium nasal spray, Breztri and Airsupra as well as Sudafed without significant relief.  Given this med regimen I did recommend that he follow-up with his allergist to see if other treatment recommended, With RTC/urgent care precautions given.  unfortunately when he called allergist, the first appointment was on April 24.   New message 3 days ago, reported increasing cough, some nasal drainage.  MyChart message noted where he did have to go into the home of a deceased cousin but was wearing a mask and  gloves.  Per that note there were cough medicine and cold medicines noted in that cousin's home.  He did have a chest x-ray on January 27 with clear lungs, no active cardiopulmonary disease.   Past 3 days more cough, using airsupra 3-4 times per day. No fever.  Coughed up small amt of white phlegm yesterday. Usually nonproductive.  Yellow nasal d/c with blood at times.  Eating and drinking ok.  No recent sick contacts.  Allergist is trying to have him get RSV - they are working on paperwork to get that covered.  Would like to see ENT locally.   Maxalt refilled for chronic migraine.    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   Diarrhea 10/03/2020   Voice disturbance 03/12/2019   Coronary artery disease involving native coronary artery of native heart without angina pectoris 03/05/2019   Irritable bowel syndrome 03/14/2016   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   Asthma 11/19/2015   Sleep apnea 11/19/2015   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 [facility]) 06/04/2014   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  rotator cuff tear 10/17/2013   Hypogonadism male 09/04/2013   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   ED (erectile dysfunction) 07/30/2013   Male erectile dysfunction, unspecified 07/30/2013   Biceps tendonitis 06/15/2013   Incomplete tear of rotator cuff 06/15/2013   Pain in upper limb 06/15/2013   Bilateral knee pain 11/07/2012   Right shoulder pain 11/07/2012   Palpitations 02/21/2012   BMI 33.0-33.9,adult 02/04/2012   Allergic rhinitis 02/04/2012    GERD (gastroesophageal reflux disease) 02/04/2012   Migraine 02/04/2012   GAD (generalized anxiety disorder) 02/04/2012   Insomnia 02/04/2012   Obesity 02/04/2012   Gastro-esophageal reflux disease without esophagitis 02/04/2012   Essential hypertension 04/13/2009   CHEST PAIN-UNSPECIFIED 04/13/2009   Abdominal pain 04/13/2009   Past Medical History:  Diagnosis Date   Anxiety    Asthma    Complication of anesthesia    oxygen levelds dropped  with colonoscopy   Depression    Dyspnea    sometimes with asthma has sob   GERD (gastroesophageal reflux disease)    Headache    Hyperlipidemia    Phreesia 12/05/2020   Pneumonia    Seasonal allergic reaction    Sleep apnea    Past Surgical History:  Procedure Laterality Date   CIRCUMCISION     COLONOSCOPY     FLEXIBLE SIGMOIDOSCOPY N/A 10/11/2023   Procedure: FLEXIBLE SIGMOIDOSCOPY;  Surgeon: Andria Meuse, MD;  Location: [facility];  Service: General;  Laterality: N/A;   LIPOMA EXCISION N/A 10/30/2018   Procedure: EXCISION OF MULTIPLE SUBCUTANEOUS LIPOMAS ON TORSO;  Surgeon: Manus Rudd, MD;  Location: [facility];  Service: General;  Laterality: N/A;   XI ROBOTIC ASSISTED LOWER ANTERIOR RESECTION N/A 10/11/2023   Procedure: XI ROBOTIC ASSISTED LOWER ANTERIOR RESECTION, SIGMOIDECTOMY WITH INTRAOPERATIVE ASSESSMENT OF PERFUSION ICG, BILATERAL TAP BLOCK;  Surgeon: Andria Meuse, MD;  Location: [facility];  Service: General;  Laterality: N/A;  180   Allergies  Allergen Reactions   Desloratadine Other (See Comments)    CLARINEX-"severe headache"   Loratadine Other (See Comments)    "severe headache"   Ketek [Telithromycin] Itching and Rash   Levbid [Hyoscyamine Sulfate] Rash   Prior to Admission medications   Medication Sig Start Date End Date Taking? Authorizing Provider  acetaminophen (TYLENOL) 500 MG tablet Take 1,000 mg by mouth every 6 (six) hours as needed for mild pain (pain score 1-3).   Yes [provider]  AIRSUPRA 90-80 MCG/ACT AERO Inhale 2 puffs into the lungs every 4 (four) hours as needed. 03/30/23  Yes [provider]  ALLEGRA ALLERGY 180 MG tablet Take 180 mg by mouth daily. 07/12/20  Yes [provider]  ALPRAZolam Prudy Feeler) 1 MG tablet TAKE 1 TABLET BY MOUTH TWICE DAILY AS NEEDED 11/28/23  Yes Shade Flood, MD  amLODipine (NORVASC) 10 MG tablet Take 10 mg by mouth every evening.   Yes [provider]  anastrozole (ARIMIDEX) 1 MG tablet Take 0.5 mg by mouth once a week.   Yes [provider]  aspirin EC 81 MG tablet Take 1 tablet (81 mg total) by mouth daily. 03/07/15  Yes Tonny Bollman, MD  Budeson-Glycopyrrol-Formoterol (BREZTRI AEROSPHERE) 160-9-4.8 MCG/ACT AERO Inhale 2 puffs into the lungs in the morning and at bedtime. 09/14/21  Yes [provider]  celecoxib (CELEBREX) 200 MG capsule Take 200 mg by mouth 2 (two) times daily as needed for moderate pain (pain score 4-6). PRN 10/23/22  Yes [provider]  diltiazem (CARDIZEM CD) 180 MG  24 hr capsule TAKE 1 CAPSULE(180 MG) BY MOUTH DAILY 04/08/23  Yes Tonny Bollman, MD  diltiazem 2 % GEL Pea sized amount to perianal skin before bowel movement, up to twice per day as needed. 10/01/23  Yes Shade Flood, MD  doxycycline (VIBRA-TABS) 100 MG tablet Take 2 tablets (200 mg total) by mouth once as needed for up to 1 dose. After unprotected intercourse. 10/31/23  Yes Shade Flood, MD  DUPIXENT 300 MG/2ML prefilled syringe Inject 300 mg into the skin every 14 (fourteen) days. 10/06/21  Yes [provider]  emtricitabine-tenofovir (TRUVADA) 200-300 MG tablet Take 1 tablet by mouth daily. 10/25/23  Yes Shade Flood, MD  esomeprazole (NEXIUM) 40 MG capsule Take 40 mg by mouth in the morning. 11/07/12  Yes [provider]  famotidine (PEPCID) 40 MG tablet Take 40 mg by mouth daily as needed for heartburn or indigestion. 02/18/20  Yes [provider]   hydrochlorothiazide (HYDRODIURIL) 25 MG tablet Take 25 mg by mouth daily.   Yes [provider]  ibuprofen (ADVIL) 200 MG tablet Take 3 tablets by mouth every 6 (six) hours as needed for mild pain (pain score 1-3).   Yes [provider]  ipratropium (ATROVENT) 0.06 % nasal spray Place 2 sprays into the nose 2 (two) times daily. Patient taking differently: Place 2 sprays into the nose daily as needed for rhinitis. 05/25/21  Yes Shade Flood, MD  ipratropium-albuterol (DUONEB) 0.5-2.5 (3) MG/3ML SOLN Take 3 mLs by nebulization every 6 (six) hours as needed (Shortness of breath or wheezing). 12/25/21  Yes [provider]  losartan (COZAAR) 25 MG tablet TAKE 1 TABLET(25 MG) BY MOUTH DAILY 04/08/23  Yes Tonny Bollman, MD  metoprolol succinate (TOPROL-XL) 25 MG 24 hr tablet Take 1 tablet (25 mg total) by mouth daily. 04/08/23  Yes Tonny Bollman, MD  mirabegron ER (MYRBETRIQ) 25 MG TB24 tablet Take 25 mg by mouth daily.   Yes [provider]  montelukast (SINGULAIR) 10 MG tablet TAKE 1 TABLET(10 MG) BY MOUTH AT BEDTIME 10/25/23  Yes Shade Flood, MD  Olopatadine-Mometasone ([name]) [phone] MCG/ACT SUSP Place 1 spray into the nose in the morning and at bedtime.   Yes [provider]  ondansetron (ZOFRAN-ODT) 4 MG disintegrating tablet DISSOLVE 1 TABLET(4 MG) ON THE TONGUE EVERY 8 HOURS AS NEEDED FOR NAUSEA OR VOMITING 08/19/23  Yes Meryl Dare, MD  pantoprazole (PROTONIX) 40 MG tablet TAKE 1 TABLET(40 MG) BY MOUTH TWICE DAILY 10/14/23  Yes Meryl Dare, MD  Potassium Chloride ER 20 MEQ TBCR Take 1 tablet (20 mEq total) by mouth daily. 04/08/23  Yes Tonny Bollman, MD  predniSONE (DELTASONE) 20 MG tablet 2 by mouth for 2 days, then 1 by mouth for 2 days, then 1/2 by mouth for 2 days. 11/18/23  Yes Shade Flood, MD  rizatriptan (MAXALT-MLT) 10 MG disintegrating tablet DISSOLVE 1 TABLET ON THE TONGUE AS NEEDED FOR  HEADACHE  MAY  REPEAT  IN  2   HOURS  AS  NEEDED 10/23/23  Yes Worthy Rancher B, FNP  rosuvastatin (CRESTOR) 10 MG tablet Take 1 tablet (10 mg total) by mouth daily. 04/08/23  Yes Tonny Bollman, MD  sildenafil (VIAGRA) 100 MG tablet Take 1/2-1 tablet by mouth once daily as needed prior to sexual activity *MUST KEEP UPCOMING APPT* 04/02/23  Yes Tonny Bollman, MD  Spacer/Aero-Holding Chambers DEVI Use with inhaler 05/15/22  Yes Icard, Rachel Bo, DO  tadalafil (CIALIS) 5 MG tablet  Take 5 mg by mouth daily. 02/01/23  Yes [provider]  tamsulosin (FLOMAX) 0.4 MG CAPS capsule TAKE 1 CAPSULE(0.4 MG) BY MOUTH DAILY AS NEEDED Patient taking differently: Take 0.4 mg by mouth every evening. 05/20/23  Yes Shade Flood, MD  testosterone cypionate (DEPOTESTOSTERONE CYPIONATE) 200 MG/ML injection Inject 100 mg into the muscle once a week. 07/07/22  Yes [provider]  valACYclovir (VALTREX) 1000 MG tablet Take 1 tablet (1,000 mg total) by mouth 3 (three) times daily. 11/15/23  Yes Shade Flood, MD  Vitamin D, Ergocalciferol, (DRISDOL) 1.25 MG (50000 UNIT) CAPS capsule TAKE ONE CAPSULE BY MOUTH EVERY 7 DAYS 08/27/23  Yes Shade Flood, MD  zolpidem (AMBIEN) 10 MG tablet TAKE 1 TABLET(10 MG) BY MOUTH AT BEDTIME 10/25/23  Yes Shade Flood, MD  albuterol (VENTOLIN HFA) 108 (90 Base) MCG/ACT inhaler INHALE 2 PUFFS INTO THE LUNGS EVERY 6 HOURS AS NEEDED FOR WHEEZING OR SHORTNESS OF BREATH Patient not taking: Reported on 10/25/2023 07/19/20   Shade Flood, MD  pramoxine-hydrocortisone (PROCTOCREAM-HC) 1-1 % rectal cream Place rectally 3 (three) times daily. Patient not taking: Reported on 10/25/2023 05/07/22   Shade Flood, MD  dicyclomine (BENTYL) 20 MG tablet Take 20 mg by mouth 3 (three) times daily before meals.    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)   Occupation: [employer]  Tobacco Use   Smoking status: Never   Smokeless tobacco: Never  Vaping Use   Vaping status: Never Used  Substance and Sexual Activity   Alcohol use: Yes    Alcohol/week: 3.0 standard drinks of alcohol    Types: 3 Standard drinks or equivalent per week    Comment: social   Drug use: No   Sexual activity: Yes  Other Topics Concern   Not on file  Social History Narrative   Not on file   Social Drivers of Health   Financial Resource Strain: Low Risk  (11/16/2023)   Overall Financial Resource Strain (CARDIA)    Difficulty of Paying Living Expenses: Not very hard  Food Insecurity: No Food Insecurity (11/16/2023)   Hunger Vital Sign    Worried About Running Out of Food in the Last Year: Never true    Ran Out of Food in the Last Year: Never true  Transportation Needs: No Transportation Needs (11/16/2023)   PRAPARE - Administrator, Civil Service (Medical): No    Lack of Transportation (Non-Medical): No  Physical Activity: Sufficiently Active (11/16/2023)   Exercise Vital Sign    Days of Exercise per Week: 5 days    Minutes of Exercise per Session: 60 min  Stress: No Stress Concern Present (11/16/2023)   [employer] of Occupational Health - Occupational Stress Questionnaire    Feeling of Stress : Only a little  Social Connections: Moderately Integrated (11/16/2023)   Social Connection and Isolation Panel [NHANES]    Frequency of Communication with Friends and Family: More than three times a week    Frequency of Social Gatherings with Friends and Family: Once a week    Attends Religious Services: More than 4 times per year    Active Member of  Clubs or Organizations: Yes    Attends Banker Meetings: 1 to 4 times per year    Marital Status: Never married  Intimate Partner  Violence: Not At Risk (10/15/2023)   Humiliation, Afraid, Rape, and Kick questionnaire    Fear of Current or Ex-Partner: No    Emotionally Abused: No    Physically Abused: No    Sexually Abused: No    Review of Systems  Per HPI.  Objective:   Vitals:   12/05/23 0919  BP: 120/78  Pulse: 80  Temp: 98.7 F (37.1 C)  TempSrc: Temporal  SpO2: 98%     Physical Exam Vitals reviewed.  Constitutional:      Appearance: He is well-developed.  HENT:     Head: Normocephalic and atraumatic.     Right Ear: Tympanic membrane, ear canal and external ear normal.     Left Ear: Tympanic membrane, ear canal and external ear normal.     Nose: No rhinorrhea.     Comments: Right greater than left maxillary sinus tenderness.  Frontal sinuses not significantly tender.    Mouth/Throat:     Pharynx: No oropharyngeal exudate or posterior oropharyngeal erythema.  Eyes:     Conjunctiva/sclera: Conjunctivae normal.     Pupils: Pupils are equal, round, and reactive to light.  Cardiovascular:     Rate and Rhythm: Normal rate and regular rhythm.     Heart sounds: Normal heart sounds. No murmur heard. Pulmonary:     Effort: Pulmonary effort is normal.     Breath sounds: Wheezing (End expiratory wheeze with few faint coarse breath sounds lower lobes but overall clear.  Normal respiratory effort without distress and speaking in full sentences.) present. No rhonchi or rales.  Abdominal:     Palpations: Abdomen is soft.     Tenderness: There is no abdominal tenderness.  Musculoskeletal:     Cervical back: Neck supple.  Lymphadenopathy:     Cervical: No cervical adenopathy.  Skin:    General: Skin is warm and dry.     Findings: No rash.  Neurological:     Mental Status: He is alert and oriented to person, place, and time.  Psychiatric:        Behavior: Behavior normal.     Results for orders placed or performed in visit on 12/05/23  POC COVID-19 BinaxNow   Collection Time: 12/05/23 10:18 AM   Result Value Ref Range   SARS Coronavirus 2 Ag Negative Negative  POC Influenza A&B(BINAX/QUICKVUE)   Collection Time: 12/05/23 10:18 AM  Result Value Ref Range   Influenza A, POC Negative Negative   Influenza B, POC Negative Negative  POCT respiratory syncytial virus   Collection Time: 12/05/23 10:18 AM  Result Value Ref Range   RSV Rapid Ag Negative       Assessment & Plan:  Randall Heatwole. is a 53 y.o. male . Recurrent sinusitis - Plan: Ambulatory referral to ENT, POCT respiratory syncytial virus  Migraine with aura and without status migrainosus, not intractable - Plan: rizatriptan (MAXALT-MLT) 10 MG disintegrating tablet  Vocal cord nodules - Plan: Ambulatory referral to ENT  Cough, unspecified type - Plan: POC COVID-19 BinaxNow, POC Influenza A&B(BINAX/QUICKVUE), POCT respiratory syncytial virus, predniSONE (DELTASONE) 20 MG tablet  Uncomplicated asthma, unspecified asthma severity, unspecified whether persistent - Plan: predniSONE (DELTASONE) 20 MG tablet  Acute maxillary sinusitis, recurrence not specified - Plan: predniSONE (DELTASONE) 20 MG tablet, amoxicillin-clavulanate (AUGMENTIN) 875-125 MG tablet  Relapsing remitting cough, sinusitis, with  partial treatment previously, and then recurrence, worsening recently.  Likely does have a component of allergies, but with recent worsening symptoms and continued discolored nasal discharge, sinus pressure, I think it is reasonable to try another course of Augmentin, prednisone with potential side effects and risks of these meds discussed.  With his recurrent sinus issues and history of vocal cord nodules he would like to meet with ENT locally, referral placed.  RTC precautions given.  Meds ordered this encounter  Medications   rizatriptan (MAXALT-MLT) 10 MG disintegrating tablet    Sig: DISSOLVE 1 TABLET ON THE TONGUE AS NEEDED FOR HEADACHE. MAY REPEAT IN 2 HOURS AS NEEDED    Dispense:  9 tablet    Refill:  0   predniSONE  (DELTASONE) 20 MG tablet    Sig: 3 by mouth for 3 days, then 2 by mouth for 2 days, then 1 by mouth for 2 days, then 1/2 by mouth for 2 days.    Dispense:  16 tablet    Refill:  0   amoxicillin-clavulanate (AUGMENTIN) 875-125 MG tablet    Sig: Take 1 tablet by mouth 2 (two) times daily.    Dispense:  20 tablet    Refill:  0   Patient Instructions  COVID, flu, RSV testing was negative in the office.  With worsening chest symptoms and asthma flare as well as persistent sinus symptoms we can try repeating prednisone and Augmentin.  I expect symptoms to improve within the next week.  It can take a while for the antibiotics to treat sinus infections.  No other medication changes at this time.  Follow-up if any worsening symptoms.  I did place a referral to ENT as we discussed.  Hope you feel better soon.    Signed,   Meredith Staggers, MD [facility], [facility] 12/05/23 10:20 AM

## 2023-12-10 DIAGNOSIS — N3941 Urge incontinence: Secondary | ICD-10-CM | POA: Diagnosis not present

## 2023-12-10 DIAGNOSIS — M62838 Other muscle spasm: Secondary | ICD-10-CM | POA: Diagnosis not present

## 2023-12-10 DIAGNOSIS — M6281 Muscle weakness (generalized): Secondary | ICD-10-CM | POA: Diagnosis not present

## 2023-12-10 DIAGNOSIS — N3943 Post-void dribbling: Secondary | ICD-10-CM | POA: Diagnosis not present

## 2023-12-13 ENCOUNTER — Other Ambulatory Visit (HOSPITAL_COMMUNITY): Payer: Self-pay

## 2023-12-16 ENCOUNTER — Encounter: Payer: Self-pay | Admitting: Family Medicine

## 2023-12-16 DIAGNOSIS — J301 Allergic rhinitis due to pollen: Secondary | ICD-10-CM | POA: Diagnosis not present

## 2023-12-16 DIAGNOSIS — J3089 Other allergic rhinitis: Secondary | ICD-10-CM | POA: Diagnosis not present

## 2023-12-16 NOTE — Telephone Encounter (Signed)
 Patient has concerns for a lesion

## 2023-12-17 NOTE — Telephone Encounter (Signed)
It may be best if this is evaluated in person.  I am happy to see him at any open appointment time in the next few days.  Thanks

## 2023-12-17 NOTE — Telephone Encounter (Signed)
Patient has been made an appointment

## 2023-12-18 ENCOUNTER — Ambulatory Visit: Payer: BC Managed Care – PPO | Admitting: Family Medicine

## 2023-12-18 ENCOUNTER — Encounter: Payer: Self-pay | Admitting: Family Medicine

## 2023-12-18 VITALS — BP 124/70 | HR 83 | Temp 98.8°F | Ht 70.5 in | Wt 253.2 lb

## 2023-12-18 DIAGNOSIS — L98499 Non-pressure chronic ulcer of skin of other sites with unspecified severity: Secondary | ICD-10-CM

## 2023-12-18 MED ORDER — MUPIROCIN 2 % EX OINT: 1.0000 | TOPICAL_OINTMENT | Freq: Two times a day (BID) | CUTANEOUS | 0 refills | Status: DC

## 2023-12-18 NOTE — Progress Notes (Signed)
Subjective:  Patient ID: Randall Leber., male    DOB: 1971-04-03  Age: 53 y.o. MRN: [id number]  CC:  Chief Complaint  Patient presents with   Ulcer    Pt notes since blister has now changed to tender red, and ulcer like pt wanted examined     HPI Randall Reed. presents for   Abdominal wall lesion: Recurrent abdominal wall blisters discussed at his January 13 visit.  Had been treated for possible shingles with Valtrex.  Question contact dermatitis versus less likely HSV.  Did not appear to be infected at that time, referred to dermatology - derm appt end of March,   Blister area crusted over, opened up and now appears red, some sore areas on edge, some wetness of area - noticed after scab came off 1 week ago.  No overseas travel.  Recently finished augmentin.     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   ED (erectile dysfunction) 07/30/2013   Male erectile dysfunction, unspecified 07/30/2013   Biceps tendonitis 06/15/2013   Incomplete tear of rotator cuff 06/15/2013    Pain in upper limb 06/15/2013   Bilateral knee pain 11/07/2012   Right shoulder pain 11/07/2012   Palpitations 02/21/2012   BMI 33.0-33.9,adult 02/04/2012   Allergic rhinitis 02/04/2012   GERD (gastroesophageal reflux disease) 02/04/2012   Migraine 02/04/2012   GAD (generalized anxiety disorder) 02/04/2012   Insomnia 02/04/2012   Obesity 02/04/2012   Gastro-esophageal reflux disease without esophagitis 02/04/2012   Essential hypertension 04/13/2009   CHEST PAIN-UNSPECIFIED 04/13/2009   Abdominal pain 04/13/2009   Past Medical History:  Diagnosis Date   Anxiety    Asthma    Complication of anesthesia    oxygen levelds dropped  with colonoscopy   Depression    Dyspnea    sometimes with asthma has sob   GERD (gastroesophageal reflux disease)    Headache    Hyperlipidemia    Phreesia 12/05/2020   Pneumonia    Seasonal allergic reaction    Sleep apnea    Past Surgical History:  Procedure Laterality Date   CIRCUMCISION     COLONOSCOPY     FLEXIBLE SIGMOIDOSCOPY N/A 10/11/2023   Procedure: FLEXIBLE SIGMOIDOSCOPY;  Surgeon: Andria Meuse, MD;  Location: [facility];  Service: General;  Laterality: N/A;   LIPOMA EXCISION N/A 10/30/2018   Procedure: EXCISION OF MULTIPLE SUBCUTANEOUS LIPOMAS ON TORSO;  Surgeon: Manus Rudd, MD;  Location: [facility];  Service: General;  Laterality: N/A;   XI ROBOTIC ASSISTED LOWER ANTERIOR RESECTION N/A 10/11/2023   Procedure: XI ROBOTIC ASSISTED LOWER ANTERIOR RESECTION, SIGMOIDECTOMY WITH INTRAOPERATIVE ASSESSMENT OF PERFUSION ICG,  BILATERAL TAP BLOCK;  Surgeon: Andria Meuse, MD;  Location: [facility];  Service: General;  Laterality: N/A;  180   Allergies  Allergen Reactions   Desloratadine Other (See Comments)    CLARINEX-"severe headache"   Loratadine Other (See Comments)    "severe headache"   Ketek [Telithromycin] Itching and Rash   Levbid [Hyoscyamine Sulfate] Rash   Prior to Admission medications    Medication Sig Start Date End Date Taking? Authorizing Provider  acetaminophen (TYLENOL) 500 MG tablet Take 1,000 mg by mouth every 6 (six) hours as needed for mild pain (pain score 1-3).    [provider]  AIRSUPRA 90-80 MCG/ACT AERO Inhale 2 puffs into the lungs every 4 (four) hours as needed. 03/30/23   [provider]  albuterol (VENTOLIN HFA) 108 (90 Base) MCG/ACT inhaler INHALE 2 PUFFS INTO THE LUNGS EVERY 6 HOURS AS NEEDED FOR WHEEZING OR SHORTNESS OF BREATH Patient not taking: Reported on 10/25/2023 07/19/20   Shade Flood, MD  [facility] ALLERGY 180 MG tablet Take 180 mg by mouth daily. 07/12/20   [provider]  ALPRAZolam Prudy Feeler) 1 MG tablet TAKE 1 TABLET BY MOUTH TWICE DAILY AS NEEDED 11/28/23   Shade Flood, MD  amLODipine (NORVASC) 10 MG tablet Take 10 mg by mouth every evening.    [provider]  amoxicillin-clavulanate (AUGMENTIN) 875-125 MG tablet Take 1 tablet by mouth 2 (two) times daily. 12/05/23   Shade Flood, MD  anastrozole (ARIMIDEX) 1 MG tablet Take 0.5 mg by mouth once a week.    [provider]  aspirin EC 81 MG tablet Take 1 tablet (81 mg total) by mouth daily. 03/07/15   Tonny Bollman, MD  Budeson-Glycopyrrol-Formoterol (BREZTRI AEROSPHERE) 160-9-4.8 MCG/ACT AERO Inhale 2 puffs into the lungs in the morning and at bedtime. 09/14/21   [provider]  celecoxib (CELEBREX) 200 MG capsule Take 200 mg by mouth 2 (two) times daily as needed for moderate pain (pain score 4-6). PRN 10/23/22   [provider]  diltiazem (CARDIZEM CD) 180 MG 24 hr capsule TAKE 1 CAPSULE(180 MG) BY MOUTH DAILY 04/08/23   Tonny Bollman, MD  diltiazem 2 % GEL Pea sized amount to perianal skin before bowel movement, up to twice per day as needed. 10/01/23   Shade Flood, MD  DUPIXENT 300 MG/2ML prefilled syringe Inject 300 mg into the skin every 14 (fourteen) days. 10/06/21   [provider]   emtricitabine-tenofovir (TRUVADA) 200-300 MG tablet Take 1 tablet by mouth daily. 10/25/23   Shade Flood, MD  esomeprazole (NEXIUM) 40 MG capsule Take 40 mg by mouth in the morning. 11/07/12   [provider]  famotidine (PEPCID) 40 MG tablet Take 40 mg by mouth daily as needed for heartburn or indigestion. 02/18/20   [provider]  hydrochlorothiazide (HYDRODIURIL) 25 MG tablet Take 25 mg by mouth daily.    [provider]  ibuprofen (ADVIL) 200 MG tablet Take 3 tablets by mouth every 6 (six) hours as needed for mild pain (pain score 1-3).    [provider]  ipratropium (ATROVENT) 0.06 % nasal spray Place 2 sprays into the nose 2 (two) times daily. Patient taking differently: Place 2 sprays into the nose daily as needed for rhinitis. 05/25/21   Shade Flood, MD  ipratropium-albuterol (DUONEB) 0.5-2.5 (3) MG/3ML SOLN Take 3 mLs by nebulization every 6 (six) hours as needed (Shortness of breath or wheezing). 12/25/21   [provider]  losartan (  COZAAR) 25 MG tablet TAKE 1 TABLET(25 MG) BY MOUTH DAILY 04/08/23   Tonny Bollman, MD  metoprolol succinate (TOPROL-XL) 25 MG 24 hr tablet Take 1 tablet (25 mg total) by mouth daily. 04/08/23   Tonny Bollman, MD  mirabegron ER (MYRBETRIQ) 25 MG TB24 tablet Take 25 mg by mouth daily.    [provider]  montelukast (SINGULAIR) 10 MG tablet TAKE 1 TABLET(10 MG) BY MOUTH AT BEDTIME 10/25/23   Shade Flood, MD  Olopatadine-Mometasone ([name]) [phone] MCG/ACT SUSP Place 1 spray into the nose in the morning and at bedtime.    [provider]  ondansetron (ZOFRAN-ODT) 4 MG disintegrating tablet DISSOLVE 1 TABLET(4 MG) ON THE TONGUE EVERY 8 HOURS AS NEEDED FOR NAUSEA OR VOMITING 08/19/23   Meryl Dare, MD  pantoprazole (PROTONIX) 40 MG tablet TAKE 1 TABLET(40 MG) BY MOUTH TWICE DAILY 10/14/23   Meryl Dare, MD  Potassium Chloride ER 20 MEQ TBCR Take 1 tablet (20 mEq total) by mouth  daily. 04/08/23   Tonny Bollman, MD  pramoxine-hydrocortisone (PROCTOCREAM-HC) 1-1 % rectal cream Place rectally 3 (three) times daily. Patient not taking: Reported on 10/25/2023 05/07/22   Shade Flood, MD  predniSONE (DELTASONE) 20 MG tablet 3 by mouth for 3 days, then 2 by mouth for 2 days, then 1 by mouth for 2 days, then 1/2 by mouth for 2 days. 12/05/23   Shade Flood, MD  rizatriptan (MAXALT-MLT) 10 MG disintegrating tablet DISSOLVE 1 TABLET ON THE TONGUE AS NEEDED FOR HEADACHE. MAY REPEAT IN 2 HOURS AS NEEDED 12/05/23   Shade Flood, MD  rosuvastatin (CRESTOR) 10 MG tablet Take 1 tablet (10 mg total) by mouth daily. 04/08/23   Tonny Bollman, MD  sildenafil (VIAGRA) 100 MG tablet Take 1/2-1 tablet by mouth once daily as needed prior to sexual activity *MUST KEEP UPCOMING APPT* 04/02/23   Tonny Bollman, MD  Spacer/Aero-Holding Chambers DEVI Use with inhaler 05/15/22   Icard, Rachel Bo, DO  tadalafil (CIALIS) 5 MG tablet Take 5 mg by mouth daily. 02/01/23   [provider]  tamsulosin (FLOMAX) 0.4 MG CAPS capsule TAKE 1 CAPSULE(0.4 MG) BY MOUTH DAILY AS NEEDED Patient taking differently: Take 0.4 mg by mouth every evening. 05/20/23   Shade Flood, MD  testosterone cypionate (DEPOTESTOSTERONE CYPIONATE) 200 MG/ML injection Inject 100 mg into the muscle once a week. 07/07/22   [provider]  valACYclovir (VALTREX) 1000 MG tablet Take 1 tablet (1,000 mg total) by mouth 3 (three) times daily. 11/15/23   Shade Flood, MD  Vitamin D, Ergocalciferol, (DRISDOL) 1.25 MG (50000 UNIT) CAPS capsule TAKE ONE CAPSULE BY MOUTH EVERY 7 DAYS 08/27/23   Shade Flood, MD  zolpidem (AMBIEN) 10 MG tablet TAKE 1 TABLET(10 MG) BY MOUTH AT BEDTIME 10/25/23   Shade Flood, MD  dicyclomine (BENTYL) 20 MG tablet Take 20 mg by mouth 3 (three) times daily before meals.    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  Tobacco Use   Smoking status: Never   Smokeless tobacco: Never  Vaping Use   Vaping status: Never Used  Substance and  Sexual Activity   Alcohol use: Yes    Alcohol/week: 3.0 standard drinks of alcohol    Types: 3 Standard drinks or equivalent per week    Comment: social   Drug use: No   Sexual activity: Yes  Other Topics Concern   Not on file  Social History Narrative   Not on file   Social Drivers of Health   Financial Resource Strain: Low Risk  (11/16/2023)   Overall Financial Resource Strain (CARDIA)    Difficulty of Paying Living Expenses: Not very hard  Food Insecurity: No Food Insecurity (11/16/2023)   Hunger Vital Sign    Worried About Running Out of Food in the Last Year: Never true    Ran Out of Food in the Last Year: Never true  Transportation Needs: No Transportation Needs (11/16/2023)   PRAPARE - Administrator, Civil Service (Medical): No    Lack of Transportation (Non-Medical): No  Physical Activity: Sufficiently Active (11/16/2023)   Exercise Vital Sign    Days of Exercise per Week: 5 days    Minutes of Exercise per Session: 60 min  Stress: No Stress Concern Present (11/16/2023)   [employer] of Occupational Health - Occupational Stress Questionnaire    Feeling of Stress : Only a little  Social Connections: Moderately Integrated (11/16/2023)   Social Connection and Isolation Panel [NHANES]    Frequency of Communication with Friends and Family: More than three times a week    Frequency of Social Gatherings with Friends and Family: Once a week    Attends Religious Services: More than 4 times per year    Active Member of [employer] or Organizations: Yes    Attends Banker  Meetings: 1 to 4 times per year    Marital Status: Never married  Intimate Partner Violence: Not At Risk (10/15/2023)   Humiliation, Afraid, Rape, and Kick questionnaire    Fear of Current or Ex-Partner: No    Emotionally Abused: No    Physically Abused: No    Sexually Abused: No    Review of Systems Per HPI.   Objective:   Vitals:   12/18/23 1405  BP: 124/70  Pulse: 83  Temp: 98.8 F (37.1 C)  TempSrc: Temporal  SpO2: 96%  Weight: 253 lb 3.2 oz (114.9 kg)  Height: 5' 10.5" (1.791 m)     Physical Exam Vitals reviewed.  Constitutional:      General: He is not in acute distress.    Appearance: Normal appearance. He is well-developed.  HENT:     Head: Normocephalic and atraumatic.  Cardiovascular:     Rate and Rhythm: Normal rate.  Pulmonary:     Effort: Pulmonary effort is normal.  Abdominal:     Comments: Ulcerative appearing lesion lower abdominal wall.  Approximately 1.6 cm long.  No induration appreciated, minimal tenderness at the inferior aspect.  No active discharge or bleeding.  See photo.  Healing lesion superior medial.  Also without induration.    Neurological:     Mental Status: He is alert and oriented to person, place, and time.  Psychiatric:        Mood and Affect: Mood normal.      Assessment & Plan:  Randall Reed. is a 53 y.o. male . Skin ulcer, unspecified ulcer stage (HCC) - prior blister, appears to be residual ulcer from prior blister, no apparent sign of infection at this time.  Soreness likely from new/raw skin.  Start Bactroban, stop Neosporin as  neomycin may be causing some sensitivity/irritation.  Wound care discussed with bandage, cleansing, RTC precautions.   Meds ordered this encounter  Medications   mupirocin ointment (BACTROBAN) 2 %    Sig: Apply 1 Application topically 2 (two) times daily. For next 7-10 days.    Dispense:  22 g    Refill:  0   Patient Instructions  Keep follow-up with dermatology as planned.  The area  on the abdomen appears to be a healing ulcer which was likely below the blister from last visit.  I do not see any obvious infection at this time or deep infection, but you can apply the Bactroban ointment a few times per day, clean with soap and water previously and apply bandage if it is having any discharge.  If you notice any worsening symptoms, lets recheck that area but expected to be improving.  Take care!    Signed,   Meredith Staggers, MD [facility], [facility] 12/18/23 2:34 PM

## 2023-12-18 NOTE — Patient Instructions (Signed)
Keep follow-up with dermatology as planned.  The area on the abdomen appears to be a healing ulcer which was likely below the blister from last visit.  I do not see any obvious infection at this time or deep infection, but you can apply the Bactroban ointment a few times per day, clean with soap and water previously and apply bandage if it is having any discharge.  If you notice any worsening symptoms, lets recheck that area but expected to be improving.  Take care!

## 2023-12-23 ENCOUNTER — Other Ambulatory Visit: Payer: Self-pay | Admitting: Family Medicine

## 2023-12-23 DIAGNOSIS — R339 Retention of urine, unspecified: Secondary | ICD-10-CM

## 2023-12-24 NOTE — Telephone Encounter (Signed)
Lets just open up 1140 Friday and if he is not seen by derm I will agree to see him then - will be appt only for skin lesion. Thanks.

## 2023-12-24 NOTE — Telephone Encounter (Signed)
Patient is concerned with progression of wound and notes he does have a Dermatology appointment on Friday but due to forecasted weather may not be seen.

## 2023-12-24 NOTE — Telephone Encounter (Signed)
Should patient proceed to urgent care on Friday then if Dermatology cancels his appointment?

## 2023-12-24 NOTE — Telephone Encounter (Signed)
Unfortunately looks like I am booked on Friday including with the last patient as a hospital follow-up.  If there are any cancellations I am happy to see him.

## 2023-12-24 NOTE — Telephone Encounter (Signed)
Patient is still concerned they will cancel appt Friday but was made aware of Urgent care recommendation and asked if Dermatology canceled appt Friday if he can come in and see you (you are currently fully booked Friday )

## 2023-12-27 ENCOUNTER — Other Ambulatory Visit: Payer: Self-pay | Admitting: Family Medicine

## 2023-12-27 DIAGNOSIS — R0981 Nasal congestion: Secondary | ICD-10-CM | POA: Diagnosis not present

## 2023-12-27 DIAGNOSIS — J45909 Unspecified asthma, uncomplicated: Secondary | ICD-10-CM | POA: Diagnosis not present

## 2023-12-27 DIAGNOSIS — L120 Bullous pemphigoid: Secondary | ICD-10-CM | POA: Diagnosis not present

## 2023-12-27 DIAGNOSIS — J069 Acute upper respiratory infection, unspecified: Secondary | ICD-10-CM | POA: Diagnosis not present

## 2023-12-27 DIAGNOSIS — F411 Generalized anxiety disorder: Secondary | ICD-10-CM

## 2023-12-27 DIAGNOSIS — R051 Acute cough: Secondary | ICD-10-CM | POA: Diagnosis not present

## 2023-12-27 NOTE — Telephone Encounter (Signed)
Requested Prescriptions   Pending Prescriptions Disp Refills   ALPRAZolam (XANAX) 1 MG tablet [Pharmacy Med Name: ALPRAZOLAM 1MG  TABLETS] 60 tablet     Sig: TAKE 1 TABLET BY MOUTH TWICE DAILY AS NEEDED     Date of patient request: 12/27/2023 Last office visit: 12/18/2023 Upcoming visit: 05/01/2024 Date of last refill: 11/28/2023 Last refill amount: 60

## 2023-12-27 NOTE — Telephone Encounter (Signed)
Controlled substance database reviewed.  Alprazolam 1 mg #60 last filled on 11/28/2023.  Medication discussed at 10/25/2023 physical.  Refill ordered.

## 2023-12-30 ENCOUNTER — Other Ambulatory Visit (HOSPITAL_COMMUNITY): Payer: Self-pay

## 2024-01-02 ENCOUNTER — Other Ambulatory Visit: Payer: Self-pay | Admitting: Family Medicine

## 2024-01-02 DIAGNOSIS — R7989 Other specified abnormal findings of blood chemistry: Secondary | ICD-10-CM

## 2024-01-03 ENCOUNTER — Encounter: Payer: Self-pay | Admitting: Family Medicine

## 2024-01-03 DIAGNOSIS — Z202 Contact with and (suspected) exposure to infections with a predominantly sexual mode of transmission: Secondary | ICD-10-CM

## 2024-01-03 DIAGNOSIS — R7989 Other specified abnormal findings of blood chemistry: Secondary | ICD-10-CM

## 2024-01-03 MED ORDER — VITAMIN D (ERGOCALCIFEROL) 1.25 MG (50000 UNIT) PO CAPS: ORAL_CAPSULE | ORAL | 1 refills | Status: DC

## 2024-01-03 NOTE — Telephone Encounter (Signed)
Refilled. Message sent to pt.

## 2024-01-03 NOTE — Telephone Encounter (Signed)
 Patient recent vitamin D level was normal but pt requests to continue high dose as it is helping energy levels please advise

## 2024-01-04 ENCOUNTER — Other Ambulatory Visit: Payer: Self-pay | Admitting: Cardiovascular Disease

## 2024-01-04 DIAGNOSIS — E782 Mixed hyperlipidemia: Secondary | ICD-10-CM

## 2024-01-04 DIAGNOSIS — I25118 Atherosclerotic heart disease of native coronary artery with other forms of angina pectoris: Secondary | ICD-10-CM

## 2024-01-04 DIAGNOSIS — I10 Essential (primary) hypertension: Secondary | ICD-10-CM

## 2024-01-06 DIAGNOSIS — J3089 Other allergic rhinitis: Secondary | ICD-10-CM | POA: Diagnosis not present

## 2024-01-06 DIAGNOSIS — J301 Allergic rhinitis due to pollen: Secondary | ICD-10-CM | POA: Diagnosis not present

## 2024-01-13 ENCOUNTER — Other Ambulatory Visit: Payer: Self-pay | Admitting: Family Medicine

## 2024-01-13 DIAGNOSIS — G43109 Migraine with aura, not intractable, without status migrainosus: Secondary | ICD-10-CM

## 2024-01-13 NOTE — Telephone Encounter (Signed)
 Annual physical exam in December.  Migraine headaches discussed at that time.  Refilled Maxalt.

## 2024-01-13 NOTE — Telephone Encounter (Signed)
 Requested Prescriptions   Pending Prescriptions Disp Refills   rizatriptan (MAXALT-MLT) 10 MG disintegrating tablet [Pharmacy Med Name: Rizatriptan Benzoate 10 MG Oral Tablet Disintegrating] 9 tablet 0    Sig: DISSOLVE 1 TABLET IN MOUTH AS NEEDED FOR HEADACHE -MAY  REPEAT  IN  2  HOURS  AS  NEEDED     Date of patient request: 01/13/2024 Last office visit: 12/18/2023 Upcoming visit: 05/01/2024 Date of last refill: 12/05/2023 Last refill amount: 9

## 2024-01-17 ENCOUNTER — Encounter: Payer: Self-pay | Admitting: Student in an Organized Health Care Education/Training Program

## 2024-01-17 ENCOUNTER — Ambulatory Visit: Admitting: Student in an Organized Health Care Education/Training Program

## 2024-01-17 VITALS — BP 124/70 | HR 89 | Temp 98.4°F | Wt 261.0 lb

## 2024-01-17 DIAGNOSIS — H1031 Unspecified acute conjunctivitis, right eye: Secondary | ICD-10-CM | POA: Diagnosis not present

## 2024-01-17 DIAGNOSIS — H109 Unspecified conjunctivitis: Secondary | ICD-10-CM | POA: Insufficient documentation

## 2024-01-17 MED ORDER — OLOPATADINE HCL 0.1 % OP SOLN: 1.0000 [drp] | Freq: Two times a day (BID) | OPHTHALMIC | 12 refills | Status: AC | PRN

## 2024-01-17 MED ORDER — OFLOXACIN 0.3 % OP SOLN: 1.0000 [drp] | Freq: Four times a day (QID) | OPHTHALMIC | 0 refills | Status: AC

## 2024-01-17 NOTE — Progress Notes (Signed)
 Acute Office Visit  Subjective:     Patient ID: Randall Sproule., male    DOB: 1970-12-22, 53 y.o.   MRN: [id number]  Chief Complaint  Patient presents with   Eye Problem   Headache    Started yesterday morning itching but as the way went on it started to drain, redness,swelling and some crusting over of the right eye. No fevers.     HPI Patient is in today for irritation of the right eye.  He reports sudden onset of increased draining, itching of his right eye starting yesterday.  This morning he woke up with a thick goopy crust covering his right eye.  He felt like he was wiping his right eye constantly yesterday.  He was recently at a women's [employer] basketball tournament, he works as a Clinical biochemist, this was in [place].  No clear sick contacts or exposures.  No systemic symptoms.  Denies any pain in the right eye.  He reports only slight change in vision, feels like there is a slight film over his vision in the right eye.  He has been using Visine last 1 day, uses this on occasion anytime he gets watering of his eyes.  He does have a significant history of seasonal allergies, this is comanaged with an allergist, he uses Singulair along with Dupixent.  He says he does not typically gets allergic symptoms and his eyes.  Also that his allergic rhinitis is well-controlled right now, this is not the typical season that he has issues.  ROS  No fevers or chills, eating and drinking well, no other systemic symptoms      Objective:    BP 124/70   Pulse 89   Temp 98.4 F (36.9 C) (Temporal)   Wt 261 lb (118.4 kg)   SpO2 97%   BMI 36.92 kg/m    Physical Exam  Gen: Well-appearing Eyes: Mild to moderate redness of the right sclera, no discharge that I appreciate, normal pupil reflex, no pain with ocular movements, normal left eye Ears: Normal bilateral tympanic membranes Neck: Normal thyroid, no adenopathy      Assessment & Plan:   Problem List Items Addressed This Visit        Unprioritized   Conjunctivitis - Primary   Acute issue.  Exam consistent with a mild to moderate right-sided conjunctivitis.  Hard to tell if this is infectious or allergic.  He has some features of a infectious bacterial conjunctivitis including a thick crust on the eye this morning.  They also has significant seasonal allergy burden being managed with Dupixent.  Allergic conjunctivitis is not a typical symptom that he experiences.  In the fast onset of the drainage makes me suspect infectious is more likely.  Will treat for infectious conjunctivitis with ofloxacin drops 4 times daily for the next 5 to 7 days.  As we get into the tree allergy season, I also going to give him a prescription for Pataday to use if he has allergic type itching and then drainage from his eyes.  He can use this as needed.  I recommended to discontinue Visine red eye and we talked about the warnings and risks of that medication.       Meds ordered this encounter  Medications   ofloxacin (OCUFLOX) 0.3 % ophthalmic solution    Sig: Place 1 drop into the right eye 4 (four) times daily for 7 days.    Dispense:  5 mL    Refill:  0  olopatadine (PATADAY) 0.1 % ophthalmic solution    Sig: Place 1 drop into both eyes 2 (two) times daily as needed for allergies.    Dispense:  5 mL    Refill:  12    No follow-ups on file.  Tyson Alias, MD

## 2024-01-17 NOTE — Assessment & Plan Note (Signed)
 Acute issue.  Exam consistent with a mild to moderate right-sided conjunctivitis.  Hard to tell if this is infectious or allergic.  He has some features of a infectious bacterial conjunctivitis including a thick crust on the eye this morning.  They also has significant seasonal allergy burden being managed with Dupixent.  Allergic conjunctivitis is not a typical symptom that he experiences.  In the fast onset of the drainage makes me suspect infectious is more likely.  Will treat for infectious conjunctivitis with ofloxacin drops 4 times daily for the next 5 to 7 days.  As we get into the tree allergy season, I also going to give him a prescription for Pataday to use if he has allergic type itching and then drainage from his eyes.  He can use this as needed.  I recommended to discontinue Visine red eye and we talked about the warnings and risks of that medication.

## 2024-01-23 ENCOUNTER — Other Ambulatory Visit: Payer: Self-pay | Admitting: Family Medicine

## 2024-01-23 DIAGNOSIS — F411 Generalized anxiety disorder: Secondary | ICD-10-CM

## 2024-01-23 DIAGNOSIS — F5104 Psychophysiologic insomnia: Secondary | ICD-10-CM

## 2024-01-23 NOTE — Telephone Encounter (Signed)
 Requested Prescriptions   Pending Prescriptions Disp Refills   ALPRAZolam (XANAX) 1 MG tablet [Pharmacy Med Name: ALPRAZOLAM 1MG  TABLETS] 60 tablet     Sig: TAKE 1 TABLET BY MOUTH TWICE DAILY AS NEEDED   zolpidem (AMBIEN) 10 MG tablet [Pharmacy Med Name: ZOLPIDEM 10MG  TABLETS] 90 tablet     Sig: TAKE 1 TABLET(10 MG) BY MOUTH AT BEDTIME     Date of patient request: 01/23/2024 Last office visit: 12/18/2023 Upcoming visit: 05/01/2024 Date of last refill: 12/27/2023, 10/24/2024 Last refill amount: 60, 90

## 2024-01-23 NOTE — Telephone Encounter (Signed)
 Medication discussed at 10/25/2023 physical.  Controlled substance database reviewed.  Alprazolam 1 mg #60 last filled on 12/27/2023, Ambien 10 mg #90 last filled on 10/25/2023.  Refills ordered.

## 2024-01-27 DIAGNOSIS — R102 Pelvic and perineal pain: Secondary | ICD-10-CM | POA: Diagnosis not present

## 2024-01-27 DIAGNOSIS — J301 Allergic rhinitis due to pollen: Secondary | ICD-10-CM | POA: Diagnosis not present

## 2024-01-27 DIAGNOSIS — M6289 Other specified disorders of muscle: Secondary | ICD-10-CM | POA: Diagnosis not present

## 2024-01-27 DIAGNOSIS — M6281 Muscle weakness (generalized): Secondary | ICD-10-CM | POA: Diagnosis not present

## 2024-01-27 DIAGNOSIS — M62838 Other muscle spasm: Secondary | ICD-10-CM | POA: Diagnosis not present

## 2024-01-27 DIAGNOSIS — J3089 Other allergic rhinitis: Secondary | ICD-10-CM | POA: Diagnosis not present

## 2024-01-30 ENCOUNTER — Institutional Professional Consult (permissible substitution) (INDEPENDENT_AMBULATORY_CARE_PROVIDER_SITE_OTHER): Payer: BC Managed Care – PPO | Admitting: Otolaryngology

## 2024-01-31 ENCOUNTER — Encounter: Payer: Self-pay | Admitting: Cardiovascular Disease

## 2024-01-31 DIAGNOSIS — R600 Localized edema: Secondary | ICD-10-CM

## 2024-02-03 DIAGNOSIS — L120 Bullous pemphigoid: Secondary | ICD-10-CM | POA: Diagnosis not present

## 2024-02-03 MED ORDER — DOXYCYCLINE HYCLATE 100 MG PO TABS: ORAL_TABLET | ORAL | 0 refills | Status: DC

## 2024-02-03 MED ORDER — FUROSEMIDE 20 MG PO TABS: 20.0000 mg | ORAL_TABLET | Freq: Every day | ORAL | 3 refills | Status: DC

## 2024-02-03 NOTE — Addendum Note (Signed)
 Addended by: Neva Seat, Arin Peral R on: 02/03/2024 06:00 PM   Modules accepted: Orders

## 2024-02-03 NOTE — Telephone Encounter (Signed)
 Patient is questioning if he can increase DOXYPrep?

## 2024-02-07 ENCOUNTER — Other Ambulatory Visit: Payer: Self-pay | Admitting: Family Medicine

## 2024-02-07 DIAGNOSIS — G43109 Migraine with aura, not intractable, without status migrainosus: Secondary | ICD-10-CM

## 2024-02-10 NOTE — Telephone Encounter (Signed)
 Requested Prescriptions   Pending Prescriptions Disp Refills   rizatriptan (MAXALT-MLT) 10 MG disintegrating tablet [Pharmacy Med Name: Rizatriptan Benzoate 10 MG Oral Tablet Disintegrating] 9 tablet 0    Sig: DISSOLVE 1 TABLET IN MOUTH AS NEEDED FOR HEADACHE MAY  REPEAT  IN  2  HOURS  AS  NEEDED     Date of patient request: 02/10/2024 Last office visit: 12/18/2023 Upcoming visit: 05/01/2024 Date of last refill: 01/13/2024 Last refill amount: 9

## 2024-02-11 DIAGNOSIS — J45901 Unspecified asthma with (acute) exacerbation: Secondary | ICD-10-CM | POA: Diagnosis not present

## 2024-02-11 DIAGNOSIS — J0100 Acute maxillary sinusitis, unspecified: Secondary | ICD-10-CM | POA: Diagnosis not present

## 2024-02-11 DIAGNOSIS — B9689 Other specified bacterial agents as the cause of diseases classified elsewhere: Secondary | ICD-10-CM | POA: Diagnosis not present

## 2024-02-21 DIAGNOSIS — J45901 Unspecified asthma with (acute) exacerbation: Secondary | ICD-10-CM | POA: Diagnosis not present

## 2024-02-21 DIAGNOSIS — J9801 Acute bronchospasm: Secondary | ICD-10-CM | POA: Diagnosis not present

## 2024-02-21 DIAGNOSIS — B9689 Other specified bacterial agents as the cause of diseases classified elsewhere: Secondary | ICD-10-CM | POA: Diagnosis not present

## 2024-02-21 DIAGNOSIS — J208 Acute bronchitis due to other specified organisms: Secondary | ICD-10-CM | POA: Diagnosis not present

## 2024-02-24 DIAGNOSIS — J301 Allergic rhinitis due to pollen: Secondary | ICD-10-CM | POA: Diagnosis not present

## 2024-02-24 DIAGNOSIS — M6289 Other specified disorders of muscle: Secondary | ICD-10-CM | POA: Diagnosis not present

## 2024-02-24 DIAGNOSIS — M62838 Other muscle spasm: Secondary | ICD-10-CM | POA: Diagnosis not present

## 2024-02-24 DIAGNOSIS — K5902 Outlet dysfunction constipation: Secondary | ICD-10-CM | POA: Diagnosis not present

## 2024-02-24 DIAGNOSIS — M6281 Muscle weakness (generalized): Secondary | ICD-10-CM | POA: Diagnosis not present

## 2024-02-24 DIAGNOSIS — J3089 Other allergic rhinitis: Secondary | ICD-10-CM | POA: Diagnosis not present

## 2024-02-25 ENCOUNTER — Other Ambulatory Visit: Payer: Self-pay | Admitting: Family Medicine

## 2024-02-25 DIAGNOSIS — F411 Generalized anxiety disorder: Secondary | ICD-10-CM

## 2024-02-25 NOTE — Telephone Encounter (Signed)
 Controlled substance database reviewed.  Alprazolam  No. 60 last filled on 01/24/2024.  Annual physical exam in December.  Refill ordered.

## 2024-02-27 DIAGNOSIS — J301 Allergic rhinitis due to pollen: Secondary | ICD-10-CM | POA: Diagnosis not present

## 2024-02-27 DIAGNOSIS — J4550 Severe persistent asthma, uncomplicated: Secondary | ICD-10-CM | POA: Diagnosis not present

## 2024-02-27 DIAGNOSIS — J019 Acute sinusitis, unspecified: Secondary | ICD-10-CM | POA: Diagnosis not present

## 2024-02-27 DIAGNOSIS — R053 Chronic cough: Secondary | ICD-10-CM | POA: Diagnosis not present

## 2024-02-27 DIAGNOSIS — J3089 Other allergic rhinitis: Secondary | ICD-10-CM | POA: Diagnosis not present

## 2024-03-04 ENCOUNTER — Ambulatory Visit (HOSPITAL_COMMUNITY): Attending: Cardiovascular Disease

## 2024-03-04 ENCOUNTER — Encounter: Payer: Self-pay | Admitting: Cardiovascular Disease

## 2024-03-04 DIAGNOSIS — R600 Localized edema: Secondary | ICD-10-CM

## 2024-03-04 DIAGNOSIS — I509 Heart failure, unspecified: Secondary | ICD-10-CM

## 2024-03-04 DIAGNOSIS — I2510 Atherosclerotic heart disease of native coronary artery without angina pectoris: Secondary | ICD-10-CM

## 2024-03-04 HISTORY — DX: Heart failure, unspecified: I50.9

## 2024-03-04 HISTORY — DX: Atherosclerotic heart disease of native coronary artery without angina pectoris: I25.10

## 2024-03-04 LAB — ECHOCARDIOGRAM COMPLETE
Area-P 1/2: 3.68 cm2
Est EF: 50
S' Lateral: 4 cm

## 2024-03-04 NOTE — Telephone Encounter (Signed)
 Called and spoke with patient to discuss his lower extremity edema.  In addition to his MyChart message he reports he is drinking about 120 oz of fluid daily, 24 oz is just water. He states he does eat takeout often.  He was recently treated for asthma with prednisone , he has now been off prednisone  for 3 days.  He is concerned about the swelling not improving. Compliant with diuretics, no missed doses.  BP 140's systolic, 70-80's diastolic. HR 100. He reports he is anxious because his birthday is next week and he will be the same age as his father and sister when they had their cardiac events.   Will forward to Dr. Arlester Ladd and his nurse.

## 2024-03-06 ENCOUNTER — Other Ambulatory Visit: Payer: Self-pay | Admitting: Family Medicine

## 2024-03-06 DIAGNOSIS — G43109 Migraine with aura, not intractable, without status migrainosus: Secondary | ICD-10-CM

## 2024-03-08 ENCOUNTER — Encounter: Payer: Self-pay | Admitting: Cardiovascular Disease

## 2024-03-08 DIAGNOSIS — I10 Essential (primary) hypertension: Secondary | ICD-10-CM

## 2024-03-08 DIAGNOSIS — R600 Localized edema: Secondary | ICD-10-CM

## 2024-03-09 DIAGNOSIS — J301 Allergic rhinitis due to pollen: Secondary | ICD-10-CM | POA: Diagnosis not present

## 2024-03-09 DIAGNOSIS — J3089 Other allergic rhinitis: Secondary | ICD-10-CM | POA: Diagnosis not present

## 2024-03-09 NOTE — Telephone Encounter (Signed)
 Arnoldo Lapping, MD 03/06/2024 12:31 PM EDT     Reviewed the patient's medications and discussed the echo findings with him.  Now that we see he has LV dysfunction, there are some medication adjustments that are appropriate.  I suspect his leg edema is coming from calcium  channel blockers.  He is actually taking 2 different calcium  channel blockers.  I asked him to stop both of them.  I gave him the following instructions:   Stop amlodipine  Stop diltiazem  Increase metoprolol  succinate to 50 mg daily Increase losartan  to 50 mg daily   I would like him to follow-up in our Pharm.D. clinic in about 2 to 3 weeks.  He will keep an eye on his leg swelling but again I expect it to improve off of the calcium  channel blockers.  I think he would be appropriate to switch from losartan  to Entresto as long as he seems to be tolerating his medicines when he returns for follow-up in 2 to 3 weeks.  I would anticipate repeating an echocardiogram in about 6 months after he is on good medical therapy (GDMT).     Patient identification verified by 2 forms. Hilton Lucky, RN     Called and spoke to patient  Patient states:   -received call Friday from Dr. Arlester Ladd to increase Metoprolol  and losartan    -On Saturday and Sunday he took Metoprolol  50 MG at night   -Noticed after increasing dose he had high heart rate in the afternoon ranging 115-145   -When heart rate was elevated he felt "weird" head was throbbing, and was tired   -he did not wake up with any tachycardia with increased dose   -he did discontinue Amlodipine  and diltiazem    -this morning he took Metoprolol  25 MG and Losartan  25 MG   -would like to know best time/instruction for dose   -his hear rate is good today  Patient denies:   -palpitations   -Chest pain  Advised patient:   -Metoprolol  Succinate can be taken at night time   -Can take Losartan  during day time  Informed patient message sent to Dr. Arlester Ladd for input/advisement  Patient  verbalized understanding, no questions at this time

## 2024-03-09 NOTE — Telephone Encounter (Signed)
 Requested Prescriptions   Pending Prescriptions Disp Refills   rizatriptan  (MAXALT -MLT) 10 MG disintegrating tablet [Pharmacy Med Name: Rizatriptan  Benzoate 10 MG Oral Tablet Disintegrating] 9 tablet 0    Sig: DISSOLVE 1 TABLET IN MOUTH AS NEEDED FOR HEADACHE MAY  REPEAT  IN  2  HOURS  AS  NEEDED     Date of patient request: 03/06/2024 Last office visit: 12/18/2023 Upcoming visit: 05/01/2024 Date of last refill: 02/10/2024 Last refill amount: 9

## 2024-03-13 MED ORDER — METOPROLOL SUCCINATE ER 100 MG PO TB24: 100.0000 mg | ORAL_TABLET | Freq: Every day | ORAL | 3 refills | Status: DC

## 2024-03-13 MED ORDER — ENTRESTO 49-51 MG PO TABS: 1.0000 | ORAL_TABLET | Freq: Two times a day (BID) | ORAL | 11 refills | Status: DC

## 2024-03-13 NOTE — Addendum Note (Signed)
 Addended by: Lonnie Roberts R on: 03/13/2024 08:39 AM   Modules accepted: Orders

## 2024-03-16 DIAGNOSIS — M62838 Other muscle spasm: Secondary | ICD-10-CM | POA: Diagnosis not present

## 2024-03-16 DIAGNOSIS — M6281 Muscle weakness (generalized): Secondary | ICD-10-CM | POA: Diagnosis not present

## 2024-03-16 DIAGNOSIS — K5902 Outlet dysfunction constipation: Secondary | ICD-10-CM | POA: Diagnosis not present

## 2024-03-16 DIAGNOSIS — M6289 Other specified disorders of muscle: Secondary | ICD-10-CM | POA: Diagnosis not present

## 2024-03-17 NOTE — Telephone Encounter (Signed)
 Randall Kraft, PA-C  P [facility] Cc: Randall Patella, RN; Maccia, Melissa D, RPH-CPP Hello, I am covering Dr. Katheryne Pane basket while he is out. I spoke to Melissa Maccia Pharm D who is seeing the pt on 5/28. She had the following recommendations: Lets have him increase his metoprolol  to 100mg  and stop losartan  and start Entresto 49/51mg  BID. He can get a copay card for the [employer]. We have moved up his appt to next Wednesday  at 7:45am. Can you please call the pt and let him know about these changes in medications and apts. Thanks! KT  Called and spoke with patient who agrees to all the above.

## 2024-03-18 ENCOUNTER — Ambulatory Visit: Attending: Cardiology | Admitting: Pharmacist

## 2024-03-18 VITALS — BP 110/82 | HR 84

## 2024-03-18 DIAGNOSIS — I5030 Unspecified diastolic (congestive) heart failure: Secondary | ICD-10-CM | POA: Diagnosis not present

## 2024-03-18 DIAGNOSIS — R252 Cramp and spasm: Secondary | ICD-10-CM

## 2024-03-18 DIAGNOSIS — I10 Essential (primary) hypertension: Secondary | ICD-10-CM

## 2024-03-18 NOTE — Progress Notes (Signed)
 Patient ID: Randall Reed.                 DOB: [birth year]                      MRN: [id number]      HPI: Randall Reed. is a 53 y.o. male referred by Dr. Arlester Reed to HTN clinic. PMH is significant for obesity, hypertension, nonobstructive coronary artery disease found on gated coronary CT scanning, diverticultis s/p resection sigmoidectomy, asthma and tachycardia.  01/31/24 patient contacted the office about increased edema in his legs. He was started on furosemide  20mg  as needed, leg elevation and compression socks. An echo was also ordered. He contacted us  again 03/04/24 that his swelling had not improved. He was recently on prednisone  for asthma. BP was also 140's systolic. 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 Metoprolol  and losartan  were increased to 50mg . However patient then reported tachycardia and elevated blood pressure with fatigue and headache. Echo showed LV dysfunction, normal EF, grade 1 diastolic dysfunction (new).   I was consulted by Randall Reed and recommended that losartan  be switched to Entresto 49/51mg  twice a day and metoprolol  be increased to 100mg  daily  Patient presents to Pharm.D. clinic for follow-up he reports that for the last 3 days he has been feeling extremely fatigued between the hours of 3 and 6 PM.  Yesterday he checked his blood pressure and it was 88/56 later was 94/72 and then improved to 116/84.  He also described a dull pain across his chest.  Heart rate has been better and has had gone over 100.  Takes Entresto in the morning when he gets up and then again around 10 PM.  The medication list we have for patient was highly inaccurate.  Patient actually has not been taking amlodipine  for several years.  He is also no HCTZ.  He took about 5 rounds of steroids for his asthma which probably contributed to both his weight gain and his swelling.  His exercise was decreased due to his asthma  exacerbation but he slowly getting back to the gym more often.  Current HTN meds: Entresto 49/51mg  twice a day, metoprolol  succinate 100mg  daily, furosemide  20 mg daily Previously tried: amlodipine , diltiazem , hydrochlorothiazide  BP goal: <130/80  Family History: The patient's family history includes Breast cancer in an other family member; Cancer in an other family member; Coronary artery disease in his paternal grandfather; Coronary artery disease (age of onset: 41) in his father; Heart attack in his father, maternal grandfather, and another family member; Hyperlipidemia in an other family member; Hypertension in an other family member. There is no history of Stomach cancer, Esophageal cancer, or Colon cancer   Social History:   Diet: doesn't add salt to food Eats out 60% of the time- chipolte grilled chicken salad Burger once a week Coffee (1 large cup) and 2 large tea (1/2 and 1/2) decaf, 1 soft drink per day  Exercise:  Spin class, lift weights, cardio 3 times a week, lift 2-3 times per week  Home BP readings:  116/86 119/82 118/77 122/78 123/83 (after first dose of Entresto) HR 84, 92 HR Staying under 100   Wt Readings from Last 3 Encounters:  01/17/24 261 lb (118.4 kg)  12/18/23 253 lb 3.2 oz (114.9 kg)  11/18/23 245 lb 3.2 oz (111.2 kg)   BP Readings from Last 3 Encounters:  03/18/24 110/82  01/17/24 124/70  12/18/23 124/70   Pulse Readings from Last 3 Encounters:  03/18/24 84  01/17/24 89  12/18/23 83    Renal function: CrCl cannot be calculated (Patient's most recent lab result is older than the maximum 21 days allowed.).  Past Medical History:  Diagnosis Date   Anxiety    Asthma    Complication of anesthesia    oxygen levelds dropped  with colonoscopy   Depression    Dyspnea    sometimes with asthma has sob   GERD (gastroesophageal reflux disease)    Headache    Hyperlipidemia    [employer] 12/05/2020   Pneumonia    Seasonal allergic reaction     Sleep apnea     Current Outpatient Medications on File Prior to Visit  Medication Sig Dispense Refill   acetaminophen  (TYLENOL ) 500 MG tablet Take 1,000 mg by mouth every 6 (six) hours as needed for mild pain (pain score 1-3).     AIRSUPRA  90-80 MCG/ACT AERO Inhale 2 puffs into the lungs every 4 (four) hours as needed.     albuterol  (VENTOLIN  HFA) 108 (90 Base) MCG/ACT inhaler INHALE 2 PUFFS INTO THE LUNGS EVERY 6 HOURS AS NEEDED FOR WHEEZING OR SHORTNESS OF BREATH 8.5 g 0   ALLEGRA ALLERGY 180 MG tablet Take 180 mg by mouth daily.     ALPRAZolam  (XANAX ) 1 MG tablet TAKE 1 TABLET BY MOUTH TWICE DAILY AS NEEDED 60 tablet 0   aspirin  EC 81 MG tablet Take 1 tablet (81 mg total) by mouth daily.     Budeson-Glycopyrrol-Formoterol  (BREZTRI AEROSPHERE) 160-9-4.8 MCG/ACT AERO Inhale 2 puffs into the lungs in the morning and at bedtime.     celecoxib (CELEBREX) 200 MG capsule Take 200 mg by mouth 2 (two) times daily as needed for moderate pain (pain score 4-6). PRN     DUPIXENT 300 MG/2ML prefilled syringe Inject 300 mg into the skin every 14 (fourteen) days.     emtricitabine -tenofovir  (TRUVADA ) 200-300 MG tablet Take 1 tablet by mouth daily. 90 tablet 1   esomeprazole  (NEXIUM ) 40 MG capsule Take 40 mg by mouth in the morning.     famotidine (PEPCID) 40 MG tablet Take 40 mg by mouth daily as needed for heartburn or indigestion.     furosemide  (LASIX ) 20 MG tablet Take 1 tablet (20 mg total) by mouth daily. As needed for edema 90 tablet 3   ipratropium (ATROVENT ) 0.06 % nasal spray Place 2 sprays into the nose 2 (two) times daily. (Patient taking differently: Place 2 sprays into the nose daily as needed for rhinitis.) 15 mL 11   ipratropium-albuterol  (DUONEB) 0.5-2.5 (3) MG/3ML SOLN Take 3 mLs by nebulization every 6 (six) hours as needed (Shortness of breath or wheezing).     metoprolol  succinate (TOPROL -XL) 100 MG 24 hr tablet Take 1 tablet (100 mg total) by mouth daily. Take with or immediately  following a meal. 90 tablet 3   mirabegron ER (MYRBETRIQ) 25 MG TB24 tablet Take 25 mg by mouth daily as needed (bladder spasm).     montelukast  (SINGULAIR ) 10 MG tablet TAKE 1 TABLET(10 MG) BY MOUTH AT BEDTIME 90 tablet 3   Olopatadine -Mometasone  ([name]) 665-25 MCG/ACT SUSP Place 1 spray into the nose in the morning and at bedtime.     ondansetron  (ZOFRAN -ODT) 4 MG disintegrating tablet DISSOLVE 1 TABLET(4 MG) ON THE TONGUE EVERY 8 HOURS AS NEEDED FOR NAUSEA OR VOMITING 20 tablet 3   pantoprazole  (PROTONIX ) 40 MG tablet TAKE 1 TABLET(40  MG) BY MOUTH TWICE DAILY (Patient taking differently: 40 mg daily.) 180 tablet 1   Potassium Chloride  ER 20 MEQ TBCR Take 1 tablet (20 mEq total) by mouth daily. 90 tablet 3   pramoxine-hydrocortisone  (PROCTOCREAM-HC) 1-1 % rectal cream Place rectally 3 (three) times daily. 30 g 1   rizatriptan  (MAXALT -MLT) 10 MG disintegrating tablet DISSOLVE 1 TABLET IN MOUTH AS NEEDED FOR  HEADACHE  MAY  REPEAT  IN  2  HOURS  AS  NEEDED 9 tablet 0   rosuvastatin  (CRESTOR ) 10 MG tablet Take 1 tablet (10 mg total) by mouth daily. 90 tablet 3   sacubitril-valsartan  (ENTRESTO) 24-26 MG Take 1 tablet by mouth 2 (two) times daily.     sildenafil  (VIAGRA ) 100 MG tablet Take 1/2-1 tablet by mouth once daily as needed prior to sexual activity *MUST KEEP UPCOMING APPT* 90 tablet 3   tadalafil  (CIALIS ) 5 MG tablet Take 5 mg by mouth daily.     tamsulosin  (FLOMAX ) 0.4 MG CAPS capsule TAKE 1 CAPSULE(0.4 MG) BY MOUTH DAILY AS NEEDED 90 capsule 0   testosterone  cypionate (DEPOTESTOSTERONE CYPIONATE) 200 MG/ML injection Inject 100 mg into the muscle once a week.     Vitamin D , Ergocalciferol , (DRISDOL ) 1.25 MG (50000 UNIT) CAPS capsule TAKE ONE CAPSULE BY MOUTH EVERY 7 DAYS 15 capsule 1   zolpidem  (AMBIEN ) 10 MG tablet TAKE 1 TABLET(10 MG) BY MOUTH AT BEDTIME 90 tablet 0   anastrozole (ARIMIDEX) 1 MG tablet Take 0.5 mg by mouth once a week. (Patient not taking: Reported on 03/18/2024)      diltiazem  2 % GEL Pea sized amount to perianal skin before bowel movement, up to twice per day as needed. (Patient not taking: Reported on 03/18/2024) 30 g 0   doxycycline  (VIBRA -TABS) 100 MG tablet Take 2 tablets (200 mg total) by mouth once as needed for up to 1 dose. After unprotected intercourse. (Patient not taking: Reported on 03/18/2024) 30 tablet 0   olopatadine  (PATADAY ) 0.1 % ophthalmic solution Place 1 drop into both eyes 2 (two) times daily as needed for allergies. (Patient not taking: Reported on 03/18/2024) 5 mL 12   Spacer/Aero-Holding Chambers DEVI Use with inhaler 1 each 0   [DISCONTINUED] dicyclomine  (BENTYL ) 20 MG tablet Take 20 mg by mouth 3 (three) times daily before meals.     No current facility-administered medications on file prior to visit.    Allergies  Allergen Reactions   Desloratadine Other (See Comments)    CLARINEX-"severe headache"   Loratadine Other (See Comments)    "severe headache"   Ketek [Telithromycin] Itching and Rash   Levbid [Hyoscyamine  Sulfate] Rash    Blood pressure 110/82, pulse 84.   Assessment/Plan:     1. Hypertension -  Essential hypertension Assessment: Blood pressure at goal in clinic today, diastolic just a smidge high He has been feeling fatigued between 3 and 6 PM.  Yesterday blood pressure was significantly low Minor swelling on exam, per patient swelling generally gets worse throughout the day.  He is taking furosemide  20 mg daily.  Has not taken any of his morning medications yet Does drink a decent amount of caffeine and we have discussed decreasing this We discussed watching salt intake limiting to 2000 mg/day States he still having leg cramps-checking potassium and magnesium  today  Plan: Will decrease Entresto to 24 to 26 mg twice a day due to hypotension yesterday and feeling of fatigue Continue metoprolol  100 mg daily and furosemide  20 mg daily Will check a BMP  and magnesium  today Follow-up with me in clinic 1.5  months He will send me blood pressure readings in about 2 weeks to reassess in the meantime    Thank you  Ahmaya Ostermiller D Ikesha Siller, Pharm.Monika Annas, CPP [facility] [address] [place], [place] [address]  Phone: [phone]; Fax: [phone]

## 2024-03-18 NOTE — Patient Instructions (Addendum)
 Your blood pressure goal is < 130/28mmHg   Please decrease your Entresto to 24/26mg  (1/2 tablet) by mouth twice a day Continue metoprolol  100mg  daily Please continue to check blood pressure Please send me a message with any issues Please send me blood pressure readings in a few weeks  Important lifestyle changes to control high blood pressure  Intervention  Effect on the BP   Weight loss Weight loss is one of the most effective lifestyle changes for controlling blood pressure. If you're overweight or obese, losing even a small amount of weight can help reduce blood pressure.    Blood pressure can decrease by 1 millimeter of mercury (mmHg) with each kilogram (about 2.2 pounds) of weight lost.   Exercise regularly As a general goal, aim for 30 minutes of moderate physical activity every day.    Regular physical activity can lower blood pressure by 5 - 8 mmHg.   Eat a healthy diet Eat a diet rich in whole grains, fruits, vegetables, lean meat, and low-fat dairy products. Limit processed foods, saturated fat, and sweets.    A heart-healthy diet can lower high blood pressure by 10 mmHg.   Reduce salt (sodium) in your diet Aim for 000mg  of sodium each day. Avoid deli meats, canned food, and frozen microwave meals which are high in sodium.     Limiting sodium can reduce blood pressure by 5 mmHg.   Limit alcohol One drink equals 12 ounces of beer, 5 ounces of wine, or 1.5 ounces of 80-proof liquor.    Limiting alcohol to < 1 drink a day for women or < 2 drinks a day for men can help lower blood pressure by about 4 mmHg.   To check your pressure at home you will need to:   Sit up in a chair, with feet flat on the floor and back supported. Do not cross your ankles or legs. Rest your left arm so that the cuff is about heart level. If the cuff goes on your upper arm, then just relax your arm on the table, arm of the chair, or your lap. If you have a wrist cuff, hold your wrist  against your chest at heart level. Place the cuff snugly around your arm, about 1 inch above the crease of your elbow. The cords should be inside the groove of your elbow.  Sit quietly, with the cuff in place, for about 5 minutes. Then press the power button to start a reading. Do not talk or move while the reading is taking place.  Record your readings on a sheet of paper. Although most cuffs have a memory, it is often easier to see a pattern developing when the numbers are all in front of you.  You can repeat the reading after 1-3 minutes if it is recommended.   Make sure your bladder is empty and you have not had caffeine or tobacco within the last 30 minutes   Always bring your blood pressure log with you to your appointments. If you have not brought your monitor in to be double checked for accuracy, please bring it to your next appointment.   You can find a list of validated (accurate) blood pressure cuffs at: validatebp.org

## 2024-03-18 NOTE — Assessment & Plan Note (Signed)
 Assessment: Blood pressure at goal in clinic today, diastolic just a smidge high He has been feeling fatigued between 3 and 6 PM.  Yesterday blood pressure was significantly low Minor swelling on exam, per patient swelling generally gets worse throughout the day.  He is taking furosemide  20 mg daily.  Has not taken any of his morning medications yet Does drink a decent amount of caffeine and we have discussed decreasing this We discussed watching salt intake limiting to 2000 mg/day States he still having leg cramps-checking potassium and magnesium  today  Plan: Will decrease Entresto to 24 to 26 mg twice a day due to hypotension yesterday and feeling of fatigue Continue metoprolol  100 mg daily and furosemide  20 mg daily Will check a BMP and magnesium  today Follow-up with me in clinic 1.5 months He will send me blood pressure readings in about 2 weeks to reassess in the meantime

## 2024-03-19 ENCOUNTER — Ambulatory Visit: Payer: Self-pay | Admitting: Pharmacist

## 2024-03-19 LAB — BASIC METABOLIC PANEL WITH GFR
BUN/Creatinine Ratio: 11 (ref 9–20)
BUN: 13 mg/dL (ref 6–24)
CO2: 21 mmol/L (ref 20–29)
Calcium: 8.9 mg/dL (ref 8.7–10.2)
Chloride: 104 mmol/L (ref 96–106)
Creatinine, Ser: 1.2 mg/dL (ref 0.76–1.27)
Glucose: 93 mg/dL (ref 70–99)
Potassium: 4.7 mmol/L (ref 3.5–5.2)
Sodium: 140 mmol/L (ref 134–144)
eGFR: 71 mL/min/{1.73_m2} (ref >59)

## 2024-03-19 LAB — PRO B NATRIURETIC PEPTIDE

## 2024-03-19 LAB — MAGNESIUM: Magnesium: 2.2 mg/dL (ref 1.6–2.3)

## 2024-03-21 ENCOUNTER — Other Ambulatory Visit: Payer: Self-pay | Admitting: Family Medicine

## 2024-03-21 DIAGNOSIS — R339 Retention of urine, unspecified: Secondary | ICD-10-CM

## 2024-03-22 ENCOUNTER — Other Ambulatory Visit: Payer: Self-pay | Admitting: Family Medicine

## 2024-03-22 DIAGNOSIS — F411 Generalized anxiety disorder: Secondary | ICD-10-CM

## 2024-03-23 ENCOUNTER — Other Ambulatory Visit: Payer: Self-pay | Admitting: Family Medicine

## 2024-03-23 DIAGNOSIS — J45909 Unspecified asthma, uncomplicated: Secondary | ICD-10-CM

## 2024-03-23 DIAGNOSIS — J309 Allergic rhinitis, unspecified: Secondary | ICD-10-CM

## 2024-03-23 NOTE — Telephone Encounter (Signed)
 Medication discussed at his physical on 10/25/2023.  Controlled substance database reviewed.  Alprazolam  last refilled 02/26/2024, previously 3/21, 2/21, 1/23.  Refill ordered.

## 2024-03-23 NOTE — Telephone Encounter (Signed)
 Requested Prescriptions   Pending Prescriptions Disp Refills   ALPRAZolam  (XANAX ) 1 MG tablet [Pharmacy Med Name: ALPRAZOLAM  1MG  TABLETS] 60 tablet     Sig: TAKE 1 TABLET BY MOUTH TWICE DAILY AS NEEDED     Date of patient request: 03/22/2024 Last office visit: 12/18/2023 Upcoming visit: 03/23/2024 Date of last refill: 02/25/2024 Last refill amount: 60

## 2024-03-24 ENCOUNTER — Encounter: Payer: Self-pay | Admitting: Cardiovascular Disease

## 2024-03-24 DIAGNOSIS — J301 Allergic rhinitis due to pollen: Secondary | ICD-10-CM | POA: Diagnosis not present

## 2024-03-24 DIAGNOSIS — J3089 Other allergic rhinitis: Secondary | ICD-10-CM | POA: Diagnosis not present

## 2024-03-30 ENCOUNTER — Other Ambulatory Visit: Payer: Self-pay | Admitting: Cardiovascular Disease

## 2024-03-30 DIAGNOSIS — E782 Mixed hyperlipidemia: Secondary | ICD-10-CM

## 2024-03-30 DIAGNOSIS — I10 Essential (primary) hypertension: Secondary | ICD-10-CM

## 2024-03-30 DIAGNOSIS — I25118 Atherosclerotic heart disease of native coronary artery with other forms of angina pectoris: Secondary | ICD-10-CM

## 2024-04-01 ENCOUNTER — Telehealth (INDEPENDENT_AMBULATORY_CARE_PROVIDER_SITE_OTHER): Payer: Self-pay | Admitting: Otolaryngology

## 2024-04-01 ENCOUNTER — Ambulatory Visit: Admitting: Pharmacist

## 2024-04-01 ENCOUNTER — Other Ambulatory Visit: Payer: Self-pay

## 2024-04-01 DIAGNOSIS — I10 Essential (primary) hypertension: Secondary | ICD-10-CM

## 2024-04-01 DIAGNOSIS — E782 Mixed hyperlipidemia: Secondary | ICD-10-CM

## 2024-04-01 DIAGNOSIS — I25118 Atherosclerotic heart disease of native coronary artery with other forms of angina pectoris: Secondary | ICD-10-CM

## 2024-04-01 MED ORDER — ROSUVASTATIN CALCIUM 10 MG PO TABS: 10.0000 mg | ORAL_TABLET | Freq: Every day | ORAL | 0 refills | Status: DC

## 2024-04-01 NOTE — Telephone Encounter (Signed)
 Confirmed appt & location [id number]

## 2024-04-02 ENCOUNTER — Encounter (INDEPENDENT_AMBULATORY_CARE_PROVIDER_SITE_OTHER): Payer: Self-pay | Admitting: Otolaryngology

## 2024-04-02 ENCOUNTER — Ambulatory Visit (INDEPENDENT_AMBULATORY_CARE_PROVIDER_SITE_OTHER): Admitting: Otolaryngology

## 2024-04-02 VITALS — BP 126/82 | HR 76

## 2024-04-02 DIAGNOSIS — J343 Hypertrophy of nasal turbinates: Secondary | ICD-10-CM | POA: Diagnosis not present

## 2024-04-02 DIAGNOSIS — R0982 Postnasal drip: Secondary | ICD-10-CM

## 2024-04-02 DIAGNOSIS — J3089 Other allergic rhinitis: Secondary | ICD-10-CM | POA: Diagnosis not present

## 2024-04-02 DIAGNOSIS — R490 Dysphonia: Secondary | ICD-10-CM

## 2024-04-02 DIAGNOSIS — K219 Gastro-esophageal reflux disease without esophagitis: Secondary | ICD-10-CM | POA: Diagnosis not present

## 2024-04-02 DIAGNOSIS — R0981 Nasal congestion: Secondary | ICD-10-CM

## 2024-04-02 DIAGNOSIS — J329 Chronic sinusitis, unspecified: Secondary | ICD-10-CM | POA: Diagnosis not present

## 2024-04-02 DIAGNOSIS — J342 Deviated nasal septum: Secondary | ICD-10-CM

## 2024-04-02 MED ORDER — METOPROLOL SUCCINATE ER 50 MG PO TB24: 50.0000 mg | ORAL_TABLET | Freq: Every day | ORAL | 3 refills | Status: DC

## 2024-04-02 NOTE — Patient Instructions (Signed)

## 2024-04-02 NOTE — Progress Notes (Signed)
 ENT CONSULT:  Reason for Consult: dysphonia  and chronic nasal congestion and post-nasal drainage allergies   HPI: Discussed the use of AI scribe software for clinical note transcription with the patient, who gave verbal consent to proceed.  History of Present Illness Randall Reed. "Randall Reed" is a 53 year old male with hx of vocal cord nodules and chronic nasal congestion, allergies and recurrent sinus infections, who presents with recurrent sinus infections and voice changes.  He has a long-standing history of sinus issues and allergies, managed by an allergist at [facility] in [place]. Despite ongoing treatment, including allergy shots every two weeks since 2019, [name] nasal spray, saline nasal rinses, and Allegra, he continues to experience recurrent sinus infections. He was also on Dupixent but recently stopped due to insurance denial. This year, he has undergone five courses of prednisone  and alternating courses of amoxiclav and Z-Pak. He has not had any imaging of his sinuses nor any nasal or sinus surgeries.  He has a history of vocal cord nodules diagnosed in 2001, which were successfully treated with dietary changes and speech therapy. Recently, he has noticed a recurrence of voice changes with intermittently raspy voice. He denies any current heartburn symptoms and reports good sense of smell.  He has a history of gastroesophageal reflux disease (GERD) and is currently taking Nexium  40 mg in the morning and Protonix  at night, with noted improvement in reflux symptoms, especially after having diverticulitis surgery in December. He mentions experiencing edema in his legs and is under the care of cardiology, where he is on diuretics.  He also has asthma, managed with Singulair  at night and Dupixent, although there is an ongoing appeal with his insurance for its coverage. Asthma symptoms may be exacerbated by upper respiratory issues and postnasal drainage.   Records  Reviewed:  Cards office visit 03/18/24 Randall Reed. is a 53 y.o. male referred by Dr. Arlester Ladd to HTN clinic. PMH is significant for obesity, hypertension, nonobstructive coronary artery disease found on gated coronary CT scanning, diverticultis s/p resection sigmoidectomy, asthma and tachycardia.   [facility] visit 02/11/24 History is provided by the patient.  Patient presents with complaints of cough, facial pressure, drainage, wheezing, and fatigue x 3 days. Patient reports a history of asthma and states he has been using his rescue inhalers frequently without relief. Reports that he typically gets a cough syrup, a steroid, and Augmentin  when he has similar symptoms from his allergist as well as his PCP. Patient reports that his congestion is causing the pressure, then the drainage is causing the cough, and the drainage and cough are exacerbating his asthma. Denies known exposure to COVID or influenza. Denies fever, body aches, chills, headache, change in vision, chest pain, shortness of breath, back pain, abdominal pain.   Randall Reed. is a 53 y.o. male with concerns for a bacterial sinus infection that is leading to his current asthma exacerbation. Patient reports this is a typical pattern for him especially around this time of year. Rx for Augmentin , cough syrup, and steroid Dosepak as requested sent to patient's pharmacy. Advised of red flags and when to go to the ED for immediate evaluation. Advised patient to take all prescription medications as directed and continue taking them in addition to current medications. Patient verbalized agreement and understanding.     Past Medical History:  Diagnosis Date   Anxiety    Asthma    Complication of anesthesia    oxygen levelds dropped  with  colonoscopy   Depression    Dyspnea    sometimes with asthma has sob   GERD (gastroesophageal reflux disease)    Headache    Hyperlipidemia    Phreesia 12/05/2020   Pneumonia    Seasonal  allergic reaction    Sleep apnea     Past Surgical History:  Procedure Laterality Date   CIRCUMCISION     COLONOSCOPY     FLEXIBLE SIGMOIDOSCOPY N/A 10/11/2023   Procedure: FLEXIBLE SIGMOIDOSCOPY;  Surgeon: Melvenia Stabs, MD;  Location: [facility];  Service: General;  Laterality: N/A;   LIPOMA EXCISION N/A 10/30/2018   Procedure: EXCISION OF MULTIPLE SUBCUTANEOUS LIPOMAS ON TORSO;  Surgeon: Dareen Ebbing, MD;  Location: [facility];  Service: General;  Laterality: N/A;   XI ROBOTIC ASSISTED LOWER ANTERIOR RESECTION N/A 10/11/2023   Procedure: XI ROBOTIC ASSISTED LOWER ANTERIOR RESECTION, SIGMOIDECTOMY WITH INTRAOPERATIVE ASSESSMENT OF PERFUSION ICG, BILATERAL TAP BLOCK;  Surgeon: Melvenia Stabs, MD;  Location: [facility];  Service: General;  Laterality: N/A;  180    Family History  Problem Relation Age of Onset   Coronary artery disease Father 57       stents, CABG   Heart attack Father    Heart attack Maternal Grandfather    Coronary artery disease Paternal Grandfather    Hypertension Other    Heart attack Other    Cancer Other    Breast cancer Other    Hyperlipidemia Other    Stomach cancer Neg Hx    Esophageal cancer Neg Hx    Colon cancer Neg Hx     Social History:  reports that he has never smoked. He has never used smokeless tobacco. He reports current alcohol use of about 3.0 standard drinks of alcohol per week. He reports that he does not use drugs.  Allergies:  Allergies  Allergen Reactions   Desloratadine Other (See Comments)    CLARINEX-"severe headache" CLARITIN- "severe headache"   Loratadine Other (See Comments)    "severe headache"   Ketek [Telithromycin] Itching and Rash   Levbid [Hyoscyamine  Sulfate] Rash    Medications: I have reviewed the patient's current medications.  The PMH, PSH, Medications, Allergies, and SH were reviewed and updated.  ROS: Constitutional: Negative for fever, weight loss and weight gain. Cardiovascular:  Negative for chest pain and dyspnea on exertion. Respiratory: Is not experiencing shortness of breath at rest. Gastrointestinal: Negative for nausea and vomiting. Neurological: Negative for headaches. Psychiatric: The patient is not nervous/anxious  Blood pressure 126/82, pulse 76, SpO2 92%. There is no height or weight on file to calculate BMI.  PHYSICAL EXAM:  Exam: General: Well-developed, well-nourished Communication and Voice: Clear pitch and clarity Respiratory Respiratory effort: Equal inspiration and expiration without stridor Cardiovascular Peripheral Vascular: Warm extremities with equal color/perfusion Eyes: No nystagmus with equal extraocular motion bilaterally Neuro/Psych/Balance: Patient oriented to person, place, and time; Appropriate mood and affect; Gait is intact with no imbalance; Cranial nerves I-XII are intact Head and Face Inspection: Normocephalic and atraumatic without mass or lesion Palpation: Facial skeleton intact without bony stepoffs Salivary Glands: No mass or tenderness Facial Strength: Facial motility symmetric and full bilaterally ENT Pinna: External ear intact and fully developed External canal: Canal is patent with intact skin Tympanic Membrane: Clear and mobile External Nose: No scar or anatomic deformity Internal Nose: Septum is deviated to the right with significant nasal passage narrowing R > L. No polyp, or purulence. Mucosal edema and erythema present.  Bilateral inferior turbinate hypertrophy.  Lips, Teeth, and gums: Mucosa and teeth intact and viable TMJ: No pain to palpation with full mobility Oral cavity/oropharynx: No erythema or exudate, no lesions present Nasopharynx: No mass or lesion with intact mucosa Hypopharynx: Intact mucosa without pooling of secretions Larynx Glottic: Full true vocal cord mobility without lesion or mass Supraglottic: Normal appearing epiglottis and AE folds Interarytenoid Space: Moderate  pachydermia&edema Subglottic Space: Patent without lesion or edema Neck Neck and Trachea: Midline trachea without mass or lesion Thyroid : No mass or nodularity Lymphatics: No lymphadenopathy  Procedure: Preoperative diagnosis: hoarseness   Postoperative diagnosis:   Same = GERD LPR  Procedure: Flexible fiberoptic laryngoscopy  Surgeon: Artice Last, MD  Anesthesia: Topical lidocaine  and Afrin Complications: None Condition is stable throughout exam  Indications and consent:  The patient presents to the clinic with above symptoms. Indirect laryngoscopy view was incomplete. Thus it was recommended that they undergo a flexible fiberoptic laryngoscopy. All of the risks, benefits, and potential complications were reviewed with the patient preoperatively and verbal informed consent was obtained.  Procedure: The patient was seated upright in the clinic. Topical lidocaine  and Afrin were applied to the nasal cavity. After adequate anesthesia had occurred, I then proceeded to pass the flexible telescope into the nasal cavity. The nasal cavity was patent without rhinorrhea or polyp. The nasopharynx was also patent without mass or lesion. The base of tongue was visualized and was normal. There were no signs of pooling of secretions in the piriform sinuses. The true vocal folds were mobile bilaterally. There were no signs of glottic or supraglottic mucosal lesion or mass. There was moderate interarytenoid pachydermia and post cricoid edema. The telescope was then slowly withdrawn and the patient tolerated the procedure throughout.    PROCEDURE NOTE: nasal endoscopy  Preoperative diagnosis: chronic sinusitis symptoms  Postoperative diagnosis: same  Procedure: Diagnostic nasal endoscopy (11914)  Surgeon: Artice Last, M.D.  Anesthesia: Topical lidocaine  and Afrin  H&P REVIEW: The patient's history and physical were reviewed today prior to procedure. All medications were reviewed and  updated as well. Complications: None Condition is stable throughout exam Indications and consent: The patient presents with symptoms of chronic sinusitis not responding to previous therapies. All the risks, benefits, and potential complications were reviewed with the patient preoperatively and informed consent was obtained. The time out was completed with confirmation of the correct procedure.   Procedure: The patient was seated upright in the clinic. Topical lidocaine  and Afrin were applied to the nasal cavity. After adequate anesthesia had occurred, the rigid nasal endoscope was passed into the nasal cavity. The nasal mucosa, turbinates, septum, and sinus drainage pathways were visualized bilaterally. This revealed no purulence or significant secretions that might be cultured. There were no  polyps or sites of significant inflammation. The mucosa was intact and there was no crusting present. The scope was then slowly withdrawn and the patient tolerated the procedure well. There were no complications or blood loss.    Studies Reviewed: Neck soft tissue  FINDINGS: There is no evidence of retropharyngeal soft tissue swelling or epiglottic enlargement. The cervical airway is unremarkable and no radio-opaque foreign body identified.   IMPRESSION: Negative.  Assessment/Plan: Encounter Diagnoses  Name Primary?   Recurrent sinusitis    Nasal septal deviation Yes   Hypertrophy of both inferior nasal turbinates    Environmental and seasonal allergies    Post-nasal drip    Chronic nasal congestion    Hoarseness    Chronic GERD     Assessment and Plan  Assessment & Plan Chronic vs recurrent sinusitis with nasal inflammation septal deviation and ITH on nasal endoscopy today Chronic sinusitis with nasal inflammation and thick mucus, but no pus or purulence or polyps on scope exam. No bacterial infection. CT scan needed to differentiate between chronic inflammation and structural issues like  septal deviation ITH and CRS. - Order CT scan of sinuses. - Continue [name] nasal spray. - Advise saline nasal irrigation with NeilMed bottle. Will send Rx for steroid rinse  - Prescribed compounded steroid nasal rinse from [facility] [facility].  Deviated nasal septum ITH Deviated septum causing nasal blockage and irrigation difficulty. Potential surgical candidate for septoplasty and turbinate reduction to improve nasal patency, since he has persistent nasal congestion nasal obstruction despite of being on maximum medical management  - Discuss septoplasty and turbinate reduction surgery post-CT scan results.  Allergies and Asthma Persistent allergic rhinitis symptoms despite allergy shots and antihistamines. Nasal blockage from septal deviation may worsen symptoms. - Continue allergy shots biweekly. - Continue Allegra for symptom control. - continue [name] and start steroid nasal rinses   Asthma Asthma exacerbated by postnasal drip and possible reflux. Managed with Singulair  and pending Dupixent. - Continue Singulair  at night. - Await Dupixent insurance appeal outcome.  Dysphonia and hx of VF nodules  Flexible scope exam today without masses or lesions, but evidence of PND and GERD LPR - medical management of GERD LPR and PND  - consider increased hydration (currently on diuretic and limited fluid intake per Cards)    Gastroesophageal reflux disease (GERD) LPR GERD with improved symptoms post-surgery. Reflux may contribute to throat irritation and voice changes. - Continue Nexium  40 mg in the morning and Protonix  at night. - Recommend Reflux Gourmet supplement after dinner. - Provide educational materials on reflux management.   RTC 3 mo after CT sinuses    Thank you for allowing me to participate in the care of this patient. Please do not hesitate to contact me with any questions or concerns.   Artice Last, MD Otolaryngology [facility] Phone:  [phone] Fax: [phone]    04/02/2024, 8:56 AM

## 2024-04-08 ENCOUNTER — Other Ambulatory Visit: Payer: Self-pay | Admitting: Cardiovascular Disease

## 2024-04-08 ENCOUNTER — Other Ambulatory Visit: Payer: Self-pay | Admitting: Family Medicine

## 2024-04-08 DIAGNOSIS — I25118 Atherosclerotic heart disease of native coronary artery with other forms of angina pectoris: Secondary | ICD-10-CM

## 2024-04-08 DIAGNOSIS — B9689 Other specified bacterial agents as the cause of diseases classified elsewhere: Secondary | ICD-10-CM | POA: Diagnosis not present

## 2024-04-08 DIAGNOSIS — R062 Wheezing: Secondary | ICD-10-CM | POA: Diagnosis not present

## 2024-04-08 DIAGNOSIS — E782 Mixed hyperlipidemia: Secondary | ICD-10-CM

## 2024-04-08 DIAGNOSIS — I10 Essential (primary) hypertension: Secondary | ICD-10-CM

## 2024-04-08 DIAGNOSIS — J9801 Acute bronchospasm: Secondary | ICD-10-CM | POA: Diagnosis not present

## 2024-04-08 DIAGNOSIS — J019 Acute sinusitis, unspecified: Secondary | ICD-10-CM | POA: Diagnosis not present

## 2024-04-08 DIAGNOSIS — G43109 Migraine with aura, not intractable, without status migrainosus: Secondary | ICD-10-CM

## 2024-04-08 NOTE — Telephone Encounter (Signed)
 Migraine headaches discussed in December.  Maxalt  refilled.

## 2024-04-09 DIAGNOSIS — J301 Allergic rhinitis due to pollen: Secondary | ICD-10-CM | POA: Diagnosis not present

## 2024-04-09 DIAGNOSIS — J3089 Other allergic rhinitis: Secondary | ICD-10-CM | POA: Diagnosis not present

## 2024-04-17 ENCOUNTER — Other Ambulatory Visit: Payer: Self-pay | Admitting: Family Medicine

## 2024-04-17 DIAGNOSIS — F5104 Psychophysiologic insomnia: Secondary | ICD-10-CM

## 2024-04-20 DIAGNOSIS — R718 Other abnormality of red blood cells: Secondary | ICD-10-CM | POA: Diagnosis not present

## 2024-04-20 DIAGNOSIS — E230 Hypopituitarism: Secondary | ICD-10-CM | POA: Diagnosis not present

## 2024-04-20 DIAGNOSIS — N400 Enlarged prostate without lower urinary tract symptoms: Secondary | ICD-10-CM | POA: Diagnosis not present

## 2024-04-20 DIAGNOSIS — Z125 Encounter for screening for malignant neoplasm of prostate: Secondary | ICD-10-CM | POA: Diagnosis not present

## 2024-04-20 NOTE — Telephone Encounter (Signed)
 Requested Prescriptions   Pending Prescriptions Disp Refills   zolpidem  (AMBIEN ) 10 MG tablet [Pharmacy Med Name: ZOLPIDEM  10MG  TABLETS] 90 tablet     Sig: TAKE 1 TABLET(10 MG) BY MOUTH AT BEDTIME     Date of patient request: 04/20/2024 Last office visit: 12/18/2023 Upcoming visit: 05/01/2024 Date of last refill: 01/23/2024 Last refill amount: 90

## 2024-04-20 NOTE — Telephone Encounter (Signed)
 Medication discussed at his physical in December, appointment coming up in the next few weeks.  Controlled substance database reviewed.  Ambien  last filled on 01/23/2024 for #90.  Refill ordered.

## 2024-04-23 ENCOUNTER — Other Ambulatory Visit: Payer: Self-pay | Admitting: Family Medicine

## 2024-04-23 DIAGNOSIS — E669 Obesity, unspecified: Secondary | ICD-10-CM | POA: Diagnosis not present

## 2024-04-23 DIAGNOSIS — N400 Enlarged prostate without lower urinary tract symptoms: Secondary | ICD-10-CM | POA: Diagnosis not present

## 2024-04-23 DIAGNOSIS — Z125 Encounter for screening for malignant neoplasm of prostate: Secondary | ICD-10-CM | POA: Diagnosis not present

## 2024-04-23 DIAGNOSIS — J301 Allergic rhinitis due to pollen: Secondary | ICD-10-CM | POA: Diagnosis not present

## 2024-04-23 DIAGNOSIS — E230 Hypopituitarism: Secondary | ICD-10-CM | POA: Diagnosis not present

## 2024-04-23 DIAGNOSIS — J3089 Other allergic rhinitis: Secondary | ICD-10-CM | POA: Diagnosis not present

## 2024-04-23 DIAGNOSIS — F411 Generalized anxiety disorder: Secondary | ICD-10-CM

## 2024-04-23 NOTE — Telephone Encounter (Signed)
 Requested Prescriptions   Pending Prescriptions Disp Refills   ALPRAZolam  (XANAX ) 1 MG tablet [Pharmacy Med Name: ALPRAZOLAM  1MG  TABLETS] 60 tablet     Sig: TAKE 1 TABLET BY MOUTH TWICE DAILY AS NEEDED     Date of patient request: 04/23/2024 Last office visit: 12/18/2023 Upcoming visit: 05/01/2024 Date of last refill: 03/23/2024 Last refill amount: 60

## 2024-04-23 NOTE — Telephone Encounter (Signed)
 Medication discussed at December physical.  Has upcoming appointment.  Controlled substance database reviewed.  #60 alprazolam  filled on 04/01/2024.  Previously 02/26/2024.  Average use of 1 to 3/day.  Refill ordered, keep follow-up as planned June 27.

## 2024-04-24 ENCOUNTER — Encounter: Payer: Self-pay | Admitting: Family Medicine

## 2024-04-24 ENCOUNTER — Telehealth: Payer: Self-pay

## 2024-04-24 ENCOUNTER — Encounter: Payer: Self-pay | Admitting: Cardiovascular Disease

## 2024-04-24 NOTE — Telephone Encounter (Signed)
 Copied from CRM [id number]. Topic: General - Other >> Apr 24, 2024  8:25 AM Howard Macho wrote: Reason for CRM: patient called stating he went to the endocrinology and they stated his iron level was low and he need to see his primary doctor.  Some of his CBC levels were off and that is what made them run his ferritin CB [phone]

## 2024-04-24 NOTE — Telephone Encounter (Signed)
 There was also a phone note about this but patient included values for more informed decisions

## 2024-04-30 ENCOUNTER — Other Ambulatory Visit: Payer: Self-pay | Admitting: Family Medicine

## 2024-04-30 DIAGNOSIS — Z79899 Other long term (current) drug therapy: Secondary | ICD-10-CM

## 2024-05-01 ENCOUNTER — Ambulatory Visit: Payer: BC Managed Care – PPO | Admitting: Family Medicine

## 2024-05-01 ENCOUNTER — Encounter: Payer: Self-pay | Admitting: Family Medicine

## 2024-05-01 ENCOUNTER — Other Ambulatory Visit (HOSPITAL_COMMUNITY)
Admission: RE | Admit: 2024-05-01 | Discharge: 2024-05-01 | Disposition: A | Discharge status: Home / Self Care | Source: Ambulatory Visit | Attending: Family Medicine | Admitting: Family Medicine

## 2024-05-01 VITALS — BP 116/68 | HR 87 | Temp 97.9°F | Resp 19 | Ht 70.5 in | Wt 271.8 lb

## 2024-05-01 DIAGNOSIS — R790 Abnormal level of blood mineral: Secondary | ICD-10-CM | POA: Diagnosis not present

## 2024-05-01 DIAGNOSIS — Z79899 Other long term (current) drug therapy: Secondary | ICD-10-CM

## 2024-05-01 DIAGNOSIS — F5104 Psychophysiologic insomnia: Secondary | ICD-10-CM

## 2024-05-01 DIAGNOSIS — K5730 Diverticulosis of large intestine without perforation or abscess without bleeding: Secondary | ICD-10-CM | POA: Insufficient documentation

## 2024-05-01 DIAGNOSIS — Z87898 Personal history of other specified conditions: Secondary | ICD-10-CM

## 2024-05-01 DIAGNOSIS — Z113 Encounter for screening for infections with a predominantly sexual mode of transmission: Secondary | ICD-10-CM | POA: Insufficient documentation

## 2024-05-01 DIAGNOSIS — I2510 Atherosclerotic heart disease of native coronary artery without angina pectoris: Secondary | ICD-10-CM

## 2024-05-01 DIAGNOSIS — R5383 Other fatigue: Secondary | ICD-10-CM | POA: Diagnosis not present

## 2024-05-01 DIAGNOSIS — Z130 Encounter for screening for diseases of the blood and blood-forming organs and certain disorders involving the immune mechanism: Secondary | ICD-10-CM

## 2024-05-01 DIAGNOSIS — R252 Cramp and spasm: Secondary | ICD-10-CM

## 2024-05-01 DIAGNOSIS — K5792 Diverticulitis of intestine, part unspecified, without perforation or abscess without bleeding: Secondary | ICD-10-CM | POA: Insufficient documentation

## 2024-05-01 DIAGNOSIS — R718 Other abnormality of red blood cells: Secondary | ICD-10-CM | POA: Diagnosis not present

## 2024-05-01 DIAGNOSIS — R1310 Dysphagia, unspecified: Secondary | ICD-10-CM | POA: Insufficient documentation

## 2024-05-01 DIAGNOSIS — F411 Generalized anxiety disorder: Secondary | ICD-10-CM | POA: Diagnosis not present

## 2024-05-01 DIAGNOSIS — E559 Vitamin D deficiency, unspecified: Secondary | ICD-10-CM

## 2024-05-01 DIAGNOSIS — K5790 Diverticulosis of intestine, part unspecified, without perforation or abscess without bleeding: Secondary | ICD-10-CM | POA: Insufficient documentation

## 2024-05-01 DIAGNOSIS — K5732 Diverticulitis of large intestine without perforation or abscess without bleeding: Secondary | ICD-10-CM | POA: Insufficient documentation

## 2024-05-01 DIAGNOSIS — I10 Essential (primary) hypertension: Secondary | ICD-10-CM

## 2024-05-01 LAB — COMPREHENSIVE METABOLIC PANEL WITH GFR
ALT: 19 U/L (ref 0–53)
AST: 18 U/L (ref 0–37)
Albumin: 4.2 g/dL (ref 3.5–5.2)
Alkaline Phosphatase: 59 U/L (ref 39–117)
BUN: 13 mg/dL (ref 6–23)
CO2: 24 meq/L (ref 19–32)
Calcium: 8.9 mg/dL (ref 8.4–10.5)
Chloride: 105 meq/L (ref 96–112)
Creatinine, Ser: 1.14 mg/dL (ref 0.40–1.50)
GFR: 72.59 mL/min (ref >60.00)
Glucose, Bld: 84 mg/dL (ref 70–99)
Potassium: 4.1 meq/L (ref 3.5–5.1)
Sodium: 138 meq/L (ref 135–145)
Total Bilirubin: 0.6 mg/dL (ref 0.2–1.2)
Total Protein: 6.6 g/dL (ref 6.0–8.3)

## 2024-05-01 LAB — CBC WITH DIFFERENTIAL/PLATELET
Basophils Absolute: 0 10*3/uL (ref 0.0–0.1)
Basophils Relative: 0.6 % (ref 0.0–3.0)
Eosinophils Absolute: 0 10*3/uL (ref 0.0–0.7)
Eosinophils Relative: 0.5 % (ref 0.0–5.0)
HCT: 47.3 % (ref 39.0–52.0)
Hemoglobin: 15.3 g/dL (ref 13.0–17.0)
Lymphocytes Relative: 22.5 % (ref 12.0–46.0)
Lymphs Abs: 1.3 10*3/uL (ref 0.7–4.0)
MCHC: 32.3 g/dL (ref 30.0–36.0)
MCV: 78.3 fl (ref 78.0–100.0)
Monocytes Absolute: 0.5 10*3/uL (ref 0.1–1.0)
Monocytes Relative: 9.3 % (ref 3.0–12.0)
Neutro Abs: 4 10*3/uL (ref 1.4–7.7)
Neutrophils Relative %: 67.1 % (ref 43.0–77.0)
Platelets: 179 10*3/uL (ref 150.0–400.0)
RBC: 6.03 Mil/uL — ABNORMAL HIGH (ref 4.22–5.81)
RDW: 18.4 % — ABNORMAL HIGH (ref 11.5–15.5)
WBC: 5.9 10*3/uL (ref 4.0–10.5)

## 2024-05-01 LAB — TSH: TSH: 1.33 u[IU]/mL (ref 0.35–5.50)

## 2024-05-01 LAB — B12 AND FOLATE PANEL
Folate: 6.7 ng/mL (ref >5.9)
Vitamin B-12: 174 pg/mL — ABNORMAL LOW (ref 211–911)

## 2024-05-01 LAB — LIPID PANEL
Cholesterol: 120 mg/dL (ref 0–200)
HDL: 36.3 mg/dL — ABNORMAL LOW (ref >39.00)
LDL Cholesterol: 59 mg/dL (ref 0–99)
NonHDL: 83.52
Total CHOL/HDL Ratio: 3
Triglycerides: 121 mg/dL (ref 0.0–149.0)
VLDL: 24.2 mg/dL (ref 0.0–40.0)

## 2024-05-01 LAB — VITAMIN D 25 HYDROXY (VIT D DEFICIENCY, FRACTURES): VITD: 66.94 ng/mL (ref 30.00–100.00)

## 2024-05-01 LAB — HEMOGLOBIN A1C: Hgb A1c MFr Bld: 5.8 % (ref 4.6–6.5)

## 2024-05-01 MED ORDER — PANTOPRAZOLE SODIUM 40 MG PO TBEC: 40.0000 mg | DELAYED_RELEASE_TABLET | Freq: Every day | ORAL | 1 refills | Status: DC

## 2024-05-01 NOTE — Progress Notes (Unsigned)
 Subjective:  Patient ID: Randall Reed., male    DOB: February 10, 1971  Age: 53 y.o. MRN: [id number]  CC:  Chief Complaint  Patient presents with   Medical Management of Chronic Issues    Pt notes doing okay, notes needs to talk about iron deficiency     HPI [name]. presents for  Chronic condition follow-up, iron deficiency as above  Vitamin D  deficiency Treated with vitamin D  50,000 units/week. Last vitamin D  Lab Results  Component Value Date   VD25OH 57.19 10/25/2023   Hypertension: Treated with losartan , Toprol , diltiazem , amlodipine  and potassium, seen by cardiology previously.   Echo in April - EF 50%. Started on entresto , and then decreased dose by pharmacist to 24/26mg  BID (=1/2 tablet). Continued on metoprolol  100mg  every day on 5/14.  Lower BP's and fatigue on 5/20 mychart message. Decreased Toprol  to 50mg  on 5/28.  6/16 message - BP 101-137/61-79. Still some fatigue. Option of stopping entresto  and returning to losartan  - decided to check with the blood count issues first. Still having fatigue. Worse in afternoon.  Sleeping ok. No PND/orthopnea.  Hx of OSA, lost weight, no recent testing.  Home readings: 100-130/58-87 Will be worked in to see Dr. Wonda and appt with pharmacist next week.  BP Readings from Last 3 Encounters:  05/01/24 116/68  04/02/24 126/82  03/18/24 110/82   Lab Results  Component Value Date   CREATININE 1.20 03/18/2024   Hyperlipidemia: Crestor  10 mg daily. Some random cramps all the time. Not new.  Lab Results  Component Value Date   CHOL 114 10/25/2023   HDL 31.90 (L) 10/25/2023   LDLCALC 59 10/25/2023   TRIG 115.0 10/25/2023   CHOLHDL 4 10/25/2023   Lab Results  Component Value Date   ALT 55 (H) 10/25/2023   AST 30 10/25/2023   ALKPHOS 84 10/25/2023   BILITOT 0.5 10/25/2023   Prediabetes: Diet/exercise approach, has also been followed by endocrinology, on testosterone  and anastrozole for hypogonadism, managed by  urology.  Most recent A1c in the normal range. Lab Results  Component Value Date   HGBA1C 5.5 10/25/2023   Wt Readings from Last 3 Encounters:  05/01/24 271 lb 12.8 oz (123.3 kg)  01/17/24 261 lb (118.4 kg)  12/18/23 253 lb 3.2 oz (114.9 kg)   Anxiety/insomnia Generalized anxiety disorder with multiple attempts of different SSRIs in the past without benefit or tolerance issues.  Symptoms have been controlled with use of alprazolam  anywhere from 1 to 3/day average of 2/day previously.  Denies any side effects, and consistent use of meds.  We discussed potential risks of chronic use of benzodiazepines and he is aware.  Insomnia has been well-controlled with use of Ambien  10 mg, and not combining with alprazolam .  Overall symptoms have been well-controlled with plan as above.  Controlled substance database reviewed.  Alprazolam  No. 60 last filled on 04/02/2023, previously 02/26/2024.  Ambien  10 mg #90 last filled on 04/20/2024, previously 01/23/2024.   Still averaging 2 per day on alprazolam , no increased use or new side effects.  Ambien  nightly, not combining with alprazolam .  No marijuana or IDU.      05/01/2024    8:12 AM 10/25/2023    8:20 AM 07/04/2023   11:30 AM 04/24/2023    8:34 AM  GAD 7 : Generalized Anxiety Score  Nervous, Anxious, on Edge 0 0 0 0  Control/stop worrying 0 0 0 0  Worry too much - different things 0 0 0 0  Trouble relaxing 0 0 0 0  Restless 0 0 0 0  Easily annoyed or irritable 0 0 0 0  Afraid - awful might happen 0 0 0 0  Total GAD 7 Score 0 0 0 0  Anxiety Difficulty Not difficult at all         05/01/2024    8:12 AM 10/25/2023    8:20 AM 07/04/2023   11:29 AM 04/24/2023    8:34 AM 10/25/2022    2:05 PM  Depression screen PHQ 2/9  Decreased Interest 0 0 0 0 0  Down, Depressed, Hopeless 0 0 0 0 0  PHQ - 2 Score 0 0 0 0 0  Altered sleeping 1 0 0 0 0  Tired, decreased energy 2 0 1 0 0  Change in appetite 0 0 0 0 0  Feeling bad or failure about yourself  0 0 0  0 0  Trouble concentrating 0 0 0 0 0  Moving slowly or fidgety/restless 0 0 0 0 0  Suicidal thoughts 0 0 0 0 0  PHQ-9 Score 3 0 1 0 0  Difficult doing work/chores Not difficult at all       History of migraine headaches Treated with Maxalt  as needed, 1 to 2/month in the past.  Option of headache specialist has been discussed previously if increased flares. Same frequency - maxalt  works well.   HIV preexposure prophylaxis Treated with Truvada .  Also has been prescribed Doxycycline  for postexposure prophylaxis.  Since last sti screening - 2 partners.  No sx's of STI.   Iron deficiency, low ferritin Ongoing care with endocrinology as above, Dr. Faythe.  On his blood work was found to have microcytic red blood cells.  Scanned labs noted from 04/20/2024, with ferritin 7.6.  RBC 6.14, normal WBC, hemoglobin normal at 15.5, hematocrit normal at 47.2, MCV low at 76.8, RDW high at 17.8.  He did have a colonoscopy in September 2023 along with endoscopy. Previously followed by Dr. Aneita, now Dr. Leigh.  Colonoscopy in 2023 indicated a single solitary ulcer at ileocecal valve that was biopsied.  Severe diverticulosis in the left colon, stricture in the sigmoid colon associated with diverticulosis and external hemorrhoids.  Sigmoidectomy in December 2024 with general surgery, Dr. Teresa. Occasional dark stools.  Plans follow up with Dr. Teresa prior to GI follow up. Plans to schedule in next few weeks.  Needs refill of protonix  - prior to GI follow up. Taking at night, nexium  in the morning.      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   Fatigue 03/12/2021   Change in bowel habit 10/03/2020   Early satiety 10/03/2020   History of colonic polyps 10/03/2020   Screening for malignant neoplasm of colon 10/03/2020   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   Diarrhea 10/03/2020   Voice disturbance 03/12/2019   Coronary artery disease involving native coronary artery of native heart without angina pectoris 03/05/2019   Irritable bowel syndrome 03/14/2016   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   Asthma 11/19/2015   Sleep apnea 11/19/2015   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 [facility]) 06/04/2014   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  rotator cuff tear 10/17/2013   Hypogonadism male 09/04/2013   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   ED (erectile dysfunction) 07/30/2013   Male erectile dysfunction, unspecified 07/30/2013   Biceps tendonitis 06/15/2013   Incomplete tear of rotator cuff 06/15/2013   Pain in upper limb 06/15/2013   Bilateral knee pain 11/07/2012   Right shoulder pain 11/07/2012   Palpitations 02/21/2012   BMI 33.0-33.9,adult 02/04/2012   Allergic rhinitis 02/04/2012   GERD (gastroesophageal reflux disease) 02/04/2012   Migraine 02/04/2012   GAD (generalized anxiety disorder) 02/04/2012   Insomnia 02/04/2012   Obesity 02/04/2012   Gastro-esophageal reflux disease without esophagitis 02/04/2012   Essential hypertension 04/13/2009   CHEST PAIN-UNSPECIFIED 04/13/2009   Abdominal pain 04/13/2009   Past Medical History:  Diagnosis Date   Anxiety    Asthma    Complication of anesthesia    oxygen levelds dropped  with colonoscopy   Depression    Dyspnea    sometimes with asthma has sob   GERD (gastroesophageal reflux disease)    Headache    Hyperlipidemia    [employer]reesia 12/05/2020   Pneumonia    Seasonal allergic reaction    Sleep apnea    Past Surgical History:  Procedure Laterality Date   CIRCUMCISION     COLONOSCOPY     FLEXIBLE SIGMOIDOSCOPY N/A  10/11/2023   Procedure: FLEXIBLE SIGMOIDOSCOPY;  Surgeon: Teresa Lonni HERO, MD;  Location: [facility];  Service: General;  Laterality: N/A;   LIPOMA EXCISION N/A 10/30/2018   Procedure: EXCISION OF MULTIPLE SUBCUTANEOUS LIPOMAS ON TORSO;  Surgeon: Belinda Cough, MD;  Location: [facility];  Service: General;  Laterality: N/A;   XI ROBOTIC ASSISTED LOWER ANTERIOR RESECTION N/A 10/11/2023   Procedure: XI ROBOTIC ASSISTED LOWER ANTERIOR RESECTION, SIGMOIDECTOMY WITH INTRAOPERATIVE ASSESSMENT OF PERFUSION ICG, BILATERAL TAP BLOCK;  Surgeon: Teresa Lonni HERO, MD;  Location: [facility];  Service: General;  Laterality: N/A;  180   Allergies  Allergen Reactions   Desloratadine Other (See Comments)    CLARINEX-severe headache CLARITIN- severe headache   Loratadine Other (See Comments)    severe headache   Ketek [Telithromycin] Itching and Rash   Levbid  [Hyoscyamine  Sulfate] Rash   Prior to Admission medications   Medication Sig Start Date End Date Taking? Authorizing Provider  acetaminophen  (TYLENOL ) 500 MG tablet Take 1,000 mg by mouth every 6 (six) hours as needed for mild pain (pain score 1-3).   Yes [provider]  AIRSUPRA  90-80 MCG/ACT AERO Inhale 2 puffs into the lungs every 4 (four) hours as needed. 03/30/23  Yes [provider]  albuterol  (VENTOLIN  HFA) 108 (90 Base) MCG/ACT inhaler INHALE 2 PUFFS INTO THE LUNGS EVERY 6 HOURS AS NEEDED FOR WHEEZING OR SHORTNESS OF BREATH 07/19/20  Yes Levora Reyes SAUNDERS, MD  ALLEGRA ALLERGY 180 MG tablet Take 180 mg by mouth daily. 07/12/20  Yes [provider]  ALPRAZolam  (XANAX ) 1 MG tablet TAKE 1 TABLET BY MOUTH TWICE DAILY AS NEEDED 04/23/24  Yes Levora Reyes SAUNDERS, MD  aspirin  EC 81 MG tablet Take 1 tablet (81 mg total) by mouth daily. 03/07/15  Yes Wonda Sharper, MD  Budeson-Glycopyrrol-Formoterol  (BREZTRI AEROSPHERE) 160-9-4.8 MCG/ACT AERO Inhale 2 puffs into the lungs in the morning and at bedtime. 09/14/21   Yes [provider]  celecoxib (CELEBREX) 200 MG capsule Take 200 mg by mouth 2 (two) times daily as needed for moderate pain (pain score 4-6). PRN 10/23/22  Yes [provider]  DUPIXENT 300 MG/2ML prefilled syringe Inject 300 mg  into the skin every 14 (fourteen) days. 10/06/21  Yes [provider]  emtricitabine -tenofovir  (TRUVADA ) 200-300 MG tablet TAKE 1 TABLET BY MOUTH DAILY 04/30/24  Yes Levora Reyes SAUNDERS, MD  esomeprazole  (NEXIUM ) 40 MG capsule Take 40 mg by mouth in the morning. 11/07/12  Yes [provider]  famotidine (PEPCID) 40 MG tablet Take 40 mg by mouth daily as needed for heartburn or indigestion. 02/18/20  Yes [provider]  furosemide  (LASIX ) 20 MG tablet Take 1 tablet (20 mg total) by mouth daily. As needed for edema 02/03/24  Yes Wonda Sharper, MD  ipratropium (ATROVENT ) 0.06 % nasal spray Place 2 sprays into the nose 2 (two) times daily. Patient taking differently: Place 2 sprays into the nose daily as needed for rhinitis. 05/25/21  Yes Levora Reyes SAUNDERS, MD  ipratropium-albuterol  (DUONEB) 0.5-2.5 (3) MG/3ML SOLN Take 3 mLs by nebulization every 6 (six) hours as needed (Shortness of breath or wheezing). 12/25/21  Yes [provider]  metoprolol  succinate (TOPROL -XL) 50 MG 24 hr tablet Take 1 tablet (50 mg total) by mouth daily. Take with or immediately following a meal. 04/02/24  Yes Wonda Sharper, MD  mirabegron ER (MYRBETRIQ) 25 MG TB24 tablet Take 25 mg by mouth daily as needed (bladder spasm).   Yes [provider]  montelukast  (SINGULAIR ) 10 MG tablet TAKE 1 TABLET(10 MG) BY MOUTH AT BEDTIME 03/23/24  Yes Levora Reyes SAUNDERS, MD  olopatadine  (PATADAY ) 0.1 % ophthalmic solution Place 1 drop into both eyes 2 (two) times daily as needed for allergies. 01/17/24  Yes Jerrell Cleatus Ned, MD  Olopatadine -Mometasone  ([name]) [phone] MCG/ACT SUSP Place 1 spray into the nose in the morning and at bedtime.   Yes [provider]  ondansetron  (ZOFRAN -ODT) 4 MG disintegrating tablet DISSOLVE 1 TABLET(4 MG) ON THE TONGUE EVERY 8 HOURS AS NEEDED FOR NAUSEA OR VOMITING 08/19/23  Yes Aneita Gwendlyn DASEN, MD  pantoprazole  (PROTONIX ) 40 MG tablet TAKE 1 TABLET(40 MG) BY MOUTH TWICE DAILY 10/14/23  Yes Aneita Gwendlyn DASEN, MD  Potassium Chloride  ER 20 MEQ TBCR Take 1 tablet (20 mEq total) by mouth daily. 04/08/23  Yes Wonda Sharper, MD  pramoxine-hydrocortisone  (PROCTOCREAM-HC) 1-1 % rectal cream Place rectally 3 (three) times daily. 05/07/22  Yes Levora Reyes SAUNDERS, MD  rizatriptan  (MAXALT -MLT) 10 MG disintegrating tablet DISSOLVE 1 TABLETS IN MOUTH AS NEEDED FOR HEADACH MAY REPEAT IN 2 HOURS AS NEEDED 04/08/24  Yes Levora Reyes SAUNDERS, MD  rosuvastatin  (CRESTOR ) 10 MG tablet Take 1 tablet (10 mg total) by mouth daily. 04/01/24  Yes Wonda Sharper, MD  sacubitril-valsartan  (ENTRESTO ) 24-26 MG Take 1 tablet by mouth 2 (two) times daily. 03/18/24  Yes Wonda Sharper, MD  sildenafil  (VIAGRA ) 100 MG tablet Take 1/2-1 tablet by mouth once daily as needed prior to sexual activity *MUST KEEP UPCOMING APPT* 04/02/23  Yes Wonda Sharper, MD  Spacer/Aero-Holding Chambers DEVI Use with inhaler 05/15/22  Yes Icard, Adine CROME, DO  tadalafil  (CIALIS ) 5 MG tablet Take 5 mg by mouth daily. 02/01/23  Yes [provider]  tamsulosin  (FLOMAX ) 0.4 MG CAPS capsule TAKE 1 CAPSULE(0.4 MG) BY MOUTH DAILY AS NEEDED 03/23/24  Yes Levora Reyes SAUNDERS, MD  testosterone  cypionate (DEPOTESTOSTERONE CYPIONATE) 200 MG/ML injection Inject 100 mg into the muscle once a week. 07/07/22  Yes [provider]  Vitamin D , Ergocalciferol , (DRISDOL ) 1.25 MG (50000 UNIT) CAPS capsule TAKE ONE CAPSULE BY MOUTH EVERY 7 DAYS 01/03/24  Yes Levora Reyes SAUNDERS, MD  zolpidem  (AMBIEN ) 10 MG tablet  TAKE 1 TABLET(10 MG) BY MOUTH AT BEDTIME 04/20/24  Yes Levora Reyes SAUNDERS, MD  anastrozole (ARIMIDEX) 1 MG tablet Take 0.5 mg by mouth once a week. Patient not taking: Reported on  03/18/2024    [provider]  diltiazem  2 % GEL Pea sized amount to perianal skin before bowel movement, up to twice per day as needed. Patient not taking: Reported on 04/02/2024 10/01/23   Levora Reyes SAUNDERS, MD  doxycycline  (VIBRA -TABS) 100 MG tablet Take 2 tablets (200 mg total) by mouth once as needed for up to 1 dose. After unprotected intercourse. Patient not taking: Reported on 04/02/2024 02/03/24   Levora Reyes SAUNDERS, MD  metoprolol  tartrate (LOPRESSOR ) 50 MG tablet Take 50 mg by mouth daily.    [provider]  dicyclomine  (BENTYL ) 20 MG tablet Take 20 mg by mouth 3 (three) times daily before meals.    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)   Occupation: [employer]  Tobacco Use   Smoking status: Never   Smokeless tobacco: Never  Vaping Use   Vaping status: Never Used  Substance and Sexual Activity   Alcohol use: Yes    Alcohol/week: 3.0 standard drinks of alcohol    Types: 3 Standard drinks or equivalent per week    Comment: social   Drug use: No   Sexual activity: Yes  Other Topics Concern   Not on file  Social History Narrative   Not on file   Social Drivers of Health   Financial Resource Strain: Medium Risk (04/30/2024)   Overall Financial Resource Strain (CARDIA)    Difficulty of Paying Living Expenses: Somewhat hard  Food Insecurity: Unknown (04/30/2024)   Hunger Vital Sign    Worried About Running Out of Food in the Last Year: Patient declined    Ran Out of Food in the Last Year: Never true  Transportation Needs: No Transportation Needs (04/30/2024)   PRAPARE - Administrator, Civil Service (Medical): No    Lack of Transportation  (Non-Medical): No  Physical Activity: Sufficiently Active (04/30/2024)   Exercise Vital Sign    Days of Exercise per Week: 3 days    Minutes of Exercise per Session: 90 min  Stress: Stress Concern Present (04/30/2024)   [employer] of Occupational Health - Occupational Stress Questionnaire    Feeling of Stress: To some extent  Social Connections: Moderately Integrated (04/30/2024)   Social Connection and Isolation Panel    Frequency of Communication with Friends and Family: More than three times a week    Frequency of Social Gatherings with Friends and Family: Once a week    Attends Religious Services: More than 4 times per year    Active Member of [employer] or Organizations: Yes    Attends Banker Meetings: More than 4 times per year    Marital Status: Never married  Intimate Partner Violence: Not At Risk (10/15/2023)   Humiliation, Afraid, Rape, and Kick questionnaire    Fear of Current or Ex-Partner: No    Emotionally Abused: No    Physically Abused: No    Sexually Abused: No    Review of Systems Per HPI.  Objective:   Vitals:   05/01/24 0814  BP: 116/68  Pulse: 87  Resp: 19  Temp: 97.9 F (36.6 C)  TempSrc: Temporal  SpO2: 95%  Weight: 271 lb 12.8 oz (123.3 kg)  Height: 5' 10.5 (1.791 m)     Physical Exam Vitals reviewed.  Constitutional:      Appearance: He is well-developed.  HENT:     Head: Normocephalic and atraumatic.  Neck:     Vascular: No carotid bruit or JVD.   Cardiovascular:     Rate and Rhythm: Normal rate and regular rhythm.     Heart sounds: Normal heart sounds. No murmur heard. Pulmonary:     Effort: Pulmonary effort is normal.     Breath sounds: Normal breath sounds. No rales.   Musculoskeletal:     Right lower leg: Edema (trace at sockline.) present.     Left lower leg: Edema present.   Skin:    General: Skin is warm and dry.   Neurological:     Mental Status: He is alert and oriented to person, place, and time.    Psychiatric:        Mood and Affect: Mood normal.     54 minutes spent during visit, including chart review, outside lab review, discussion of new symptoms of fatigue, review of cardiology notes, counseling and assimilation of information, exam, discussion of plan, and chart completion.     Assessment & Plan:  Randall Reed. is a 53 y.o. male . GAD (generalized anxiety disorder)  - Stable with average of 2 alprazolam  per day.  We have discussed potential risks with this medication, but intolerant to multiple different SSRIs previously.  If increased need, would recommend psychiatry evaluation to discuss alternate treatment options.  No changes for now.  28-month follow-ups.  Appropriate refills previously with controlled substance database reviewed.  On pre-exposure prophylaxis for HIV - Plan: RPR, HIV Antibody (routine testing w rflx), Urine cytology ancillary only  - Continue Truvada , check STI screening.  Denies symptoms.  Doxy Pep prescribed previously  Psychophysiological insomnia  - Stable with use of Ambien  and does not combine with Xanax .  Denies parasomnias.  Appropriate refills.  Continue same.  Screening for deficiency anemia Fatigue, unspecified type - Plan: Comprehensive metabolic panel with GFR, CBC with Differential/Platelet, TSH, CK Low ferritin - Plan: CBC with Differential/Platelet, Iron, TIBC and Ferritin Panel, B12 and Folate Panel Microcytic red blood cells - Plan: CBC with Differential/Platelet, Iron, TIBC and Ferritin Panel, B12 and Folate Panel  - Low ferritin but hemoglobin was normal.  Hematocrit normal.  Less likely cause of fatigue.  More likely with his adjustment of medications from cardiology, and he plans close follow-up to review those meds further.  Will repeat labs with repeat CBC, iron testing, repeat ferritin, B12 and folate level given elevated RDW to rule out mixed picture of deficiencies.  Follow-up to be determined by lab results  Vitamin D   deficiency - Plan: Vitamin D  (25 hydroxy)  - Check vitamin D  level and adjust plan accordingly, continue supplementation.  Screening examination for STI  - As above check routine screening for STI, asymptomatic.  Muscle cramps  - Check CPK, no med changes at this time.  Plan for close follow-up, can discuss other testing at that time if needed.  RTC precautions if new/worsening symptoms.   Essential hypertension  - Stable on current regimen, continue same  Coronary artery disease involving native coronary artery of native heart without angina pectoris - Plan:  Lipid panel  - Fatigue as above, recommended continued follow-up with his cardiologist regarding fatigue and adjustment of medications as those seem to be primary contributor., check lipid panel, continue statin.  Could consider sleep study if persistent fatigue but less likely OSA.  History of prediabetes - Plan: Hemoglobin A1c  - Most recent A1c looked okay, will continue to monitor, repeat testing ordered.  No orders of the defined types were placed in this encounter.  Patient Instructions  Keep follow-up with your cardiologist regarding your medications and fatigue.  I will check some blood work including repeating the ferritin, other blood test and iron test.  No new meds for now.  I would consider a repeat sleep study if you have persistent fatigue, but with the recent symptoms I think that following up with cardiology would be reasonable initially to discuss medications.  I am checking some of your chronic medication lab work, and will let you know if there are concerns.  No medication changes from my standpoint.  Follow-up in 1 month, sooner if any new or worsening symptoms.    Signed,   Reyes Pines, MD [facility], [facility] 05/01/24 9:03 AM

## 2024-05-01 NOTE — Patient Instructions (Signed)
 Keep follow-up with your cardiologist regarding your medications and fatigue.  I will check some blood work including repeating the ferritin, other blood test and iron test.  No new meds for now.  I would consider a repeat sleep study if you have persistent fatigue, but with the recent symptoms I think that following up with cardiology would be reasonable initially to discuss medications.  I am checking some of your chronic medication lab work, and will let you know if there are concerns.  No medication changes from my standpoint.  Follow-up in 1 month, sooner if any new or worsening symptoms.

## 2024-05-02 ENCOUNTER — Encounter: Payer: Self-pay | Admitting: Family Medicine

## 2024-05-02 LAB — HIV ANTIBODY (ROUTINE TESTING W REFLEX): HIV 1&2 Ab, 4th Generation: NONREACTIVE

## 2024-05-02 LAB — IRON,TIBC AND FERRITIN PANEL
%SAT: 11 % — ABNORMAL LOW (ref 20–48)
Ferritin: 15 ng/mL — ABNORMAL LOW (ref 38–380)
Iron: 41 ug/dL — ABNORMAL LOW (ref 50–180)
TIBC: 381 ug/dL (ref 250–425)

## 2024-05-02 LAB — RPR: RPR Ser Ql: NONREACTIVE

## 2024-05-03 ENCOUNTER — Ambulatory Visit: Payer: Self-pay | Admitting: Family Medicine

## 2024-05-03 DIAGNOSIS — E538 Deficiency of other specified B group vitamins: Secondary | ICD-10-CM

## 2024-05-03 DIAGNOSIS — E611 Iron deficiency: Secondary | ICD-10-CM

## 2024-05-04 ENCOUNTER — Encounter: Payer: Self-pay | Admitting: *Deleted

## 2024-05-04 MED ORDER — VITAMIN B-12 1000 MCG PO TABS: 1000.0000 ug | ORAL_TABLET | Freq: Every day | ORAL | 3 refills | Status: DC

## 2024-05-04 MED ORDER — FERROUS SULFATE 325 (65 FE) MG PO TABS
325.0000 mg | ORAL_TABLET | Freq: Every day | ORAL | 3 refills | Status: DC
Start: 2024-05-04 — End: 2024-09-22

## 2024-05-04 NOTE — Telephone Encounter (Signed)
 Patient replied back to result message. States Dr.White who did patients surgery did not want him to see gastro until he rechecked patient at 6 month appointment. Patient also is wondering if there is specific iron and B12 supplements he should try?

## 2024-05-05 LAB — URINE CYTOLOGY ANCILLARY ONLY
Chlamydia: NEGATIVE
Comment: NEGATIVE
Comment: NEGATIVE
Comment: NORMAL
Neisseria Gonorrhea: NEGATIVE
Trichomonas: NEGATIVE

## 2024-05-06 NOTE — Telephone Encounter (Signed)
 Entresto  removed from active medications list at this time.

## 2024-05-06 NOTE — Telephone Encounter (Signed)
 Please review and advise.

## 2024-05-11 ENCOUNTER — Ambulatory Visit: Attending: Cardiology | Admitting: Pharmacist

## 2024-05-11 VITALS — BP 122/82 | HR 71

## 2024-05-11 DIAGNOSIS — I10 Essential (primary) hypertension: Secondary | ICD-10-CM

## 2024-05-11 NOTE — Assessment & Plan Note (Signed)
 Assessment: Blood pressure appears controlled since stopping Entresto  He has had 1 episode where he is woken up with what felt like his heart racing since metoprolol  was decreased.  He will let us  know if these episodes continue He has diastolic dysfunction normal EF.  Therefore he really only needs additional medication if blood pressure not controlled Would add ARB if needed  Plan: Continue metoprolol  succinate 50 mg daily and furosemide  20 mg daily Patient to let us  know if issues with tachycardia persists or blood pressure starts to trend high

## 2024-05-11 NOTE — Patient Instructions (Signed)
 Your blood pressure goal is < 130/22mmHg   Continue checking blood pressure Please let us  know if blood pressure is >130/80 consisently   Important lifestyle changes to control high blood pressure  Intervention  Effect on the BP   Weight loss Weight loss is one of the most effective lifestyle changes for controlling blood pressure. If you're overweight or obese, losing even a small amount of weight can help reduce blood pressure.    Blood pressure can decrease by 1 millimeter of mercury (mmHg) with each kilogram (about 2.2 pounds) of weight lost.   Exercise regularly As a general goal, aim for 30 minutes of moderate physical activity every day.    Regular physical activity can lower blood pressure by 5 - 8 mmHg.   Eat a healthy diet Eat a diet rich in whole grains, fruits, vegetables, lean meat, and low-fat dairy products. Limit processed foods, saturated fat, and sweets.    A heart-healthy diet can lower high blood pressure by 10 mmHg.   Reduce salt (sodium) in your diet Aim for 000mg  of sodium each day. Avoid deli meats, canned food, and frozen microwave meals which are high in sodium.     Limiting sodium can reduce blood pressure by 5 mmHg.   Limit alcohol One drink equals 12 ounces of beer, 5 ounces of wine, or 1.5 ounces of 80-proof liquor.    Limiting alcohol to < 1 drink a day for women or < 2 drinks a day for men can help lower blood pressure by about 4 mmHg.   To check your pressure at home you will need to:   Sit up in a chair, with feet flat on the floor and back supported. Do not cross your ankles or legs. Rest your left arm so that the cuff is about heart level. If the cuff goes on your upper arm, then just relax your arm on the table, arm of the chair, or your lap. If you have a wrist cuff, hold your wrist against your chest at heart level. Place the cuff snugly around your arm, about 1 inch above the crease of your elbow. The cords should be inside the  groove of your elbow.  Sit quietly, with the cuff in place, for about 5 minutes. Then press the power button to start a reading. Do not talk or move while the reading is taking place.  Record your readings on a sheet of paper. Although most cuffs have a memory, it is often easier to see a pattern developing when the numbers are all in front of you.  You can repeat the reading after 1-3 minutes if it is recommended.   Make sure your bladder is empty and you have not had caffeine or tobacco within the last 30 minutes   Always bring your blood pressure log with you to your appointments. If you have not brought your monitor in to be double checked for accuracy, please bring it to your next appointment.   You can find a list of validated (accurate) blood pressure cuffs at: validatebp.org

## 2024-05-11 NOTE — Progress Notes (Signed)
 Patient ID: Randall Reed.                 DOB: Oct 31, 1971                      MRN: [id number]      HPI: Randall Reed. is a 53 y.o. male referred by Dr. Wonda to HTN clinic. PMH is significant for obesity, hypertension, nonobstructive coronary artery disease found on gated coronary CT scanning, diverticultis s/p resection sigmoidectomy, asthma and tachycardia.  01/31/24 patient contacted the office about increased edema in his legs. He was started on furosemide  20mg  as needed, leg elevation and compression socks. An echo was also ordered. He contacted us  again 03/04/24 that his swelling had not improved. He was recently on prednisone  for asthma. BP was also 140's systolic. His amlodipine  (he was not actually taking) and diltiazem  were stopped due to duplicate CCB and the potential cause of his swelling. Metoprolol  and losartan  were increased to 50mg . However patient then reported tachycardia and elevated blood pressure with fatigue and headache. Echo showed LV dysfunction, normal EF, grade 1 diastolic dysfunction (new).   I was consulted by Randall Reed and recommended that losartan  be switched to Entresto  49/51mg  twice a day and metoprolol  be increased to 100mg  daily.   I saw patient 03/18/2024.  His Entresto  was decreased due to episodes of hypotension and fatigue.  Entresto  was eventually stopped by Dr. Wonda 7/2 due to persistent fatigue and hypotension his metoprolol  also been decreased to 50 mg.  Diagnosed with iron deficiency anemia and low B12.  He presents today for follow-up.  Although his Entresto  was recently stopped blood pressures have been well-controlled since.  He started iron and B12 supplementation and pending appointment with GI soon  Unhappy with his weight and asks about GLP-1 therapy.  We briefly discussed risks and benefits and that it is unlikely his insurance will cover.  Has not been exercising as much due to the issues with his fatigue and low blood pressure.   Will work on lifestyle before pursuing.  Current HTN meds: metoprolol  succinate 50mg  daily, furosemide  20 mg daily Previously tried: amlodipine , diltiazem , hydrochlorothiazide  BP goal: <130/80  Family History: The patient's family history includes Breast cancer in an other family member; Cancer in an other family member; Coronary artery disease in his paternal grandfather; Coronary artery disease (age of onset: 58) in his father; Heart attack in his father, maternal grandfather, and another family member; Hyperlipidemia in an other family member; Hypertension in an other family member. There is no history of Stomach cancer, Esophageal cancer, or Colon cancer   Social History:   Diet: doesn't add salt to food Eats out 60% of the time- chipolte grilled chicken salad Burger once a week Coffee (1 large cup) and 2 large tea (1/2 and 1/2) decaf, 1 soft drink per day  Exercise:  Spin class, lift weights, cardio 3 times a week, lift 2-3 times per week  Home BP readings:  133/82 HR 83 120/78 HR 75  Wt Readings from Last 3 Encounters:  05/01/24 271 lb 12.8 oz (123.3 kg)  01/17/24 261 lb (118.4 kg)  12/18/23 253 lb 3.2 oz (114.9 kg)   BP Readings from Last 3 Encounters:  05/11/24 122/82  05/01/24 116/68  04/02/24 126/82   Pulse Readings from Last 3 Encounters:  05/11/24 71  05/01/24 87  04/02/24 76    Renal function: Estimated Creatinine Clearance: 97.1 mL/min (by C-G formula based  on SCr of 1.14 mg/dL).  Past Medical History:  Diagnosis Date   Anxiety    Asthma    Complication of anesthesia    oxygen levelds dropped  with colonoscopy   Depression    Dyspnea    sometimes with asthma has sob   GERD (gastroesophageal reflux disease)    Headache    Hyperlipidemia    Phreesia 12/05/2020   Pneumonia    Seasonal allergic reaction    Sleep apnea     Current Outpatient Medications on File Prior to Visit  Medication Sig Dispense Refill   acetaminophen  (TYLENOL ) 500 MG tablet  Take 1,000 mg by mouth every 6 (six) hours as needed for mild pain (pain score 1-3).     AIRSUPRA  90-80 MCG/ACT AERO Inhale 2 puffs into the lungs every 4 (four) hours as needed.     albuterol  (VENTOLIN  HFA) 108 (90 Base) MCG/ACT inhaler INHALE 2 PUFFS INTO THE LUNGS EVERY 6 HOURS AS NEEDED FOR WHEEZING OR SHORTNESS OF BREATH 8.5 g 0   ALLEGRA ALLERGY 180 MG tablet Take 180 mg by mouth daily.     ALPRAZolam  (XANAX ) 1 MG tablet TAKE 1 TABLET BY MOUTH TWICE DAILY AS NEEDED 60 tablet 0   anastrozole (ARIMIDEX) 1 MG tablet Take 0.5 mg by mouth once a week. (Patient not taking: Reported on 03/18/2024)     aspirin  EC 81 MG tablet Take 1 tablet (81 mg total) by mouth daily.     Budeson-Glycopyrrol-Formoterol  (BREZTRI AEROSPHERE) 160-9-4.8 MCG/ACT AERO Inhale 2 puffs into the lungs in the morning and at bedtime.     celecoxib (CELEBREX) 200 MG capsule Take 200 mg by mouth 2 (two) times daily as needed for moderate pain (pain score 4-6). PRN     cyanocobalamin  (VITAMIN B12) 1000 MCG tablet Take 1 tablet (1,000 mcg total) by mouth daily. 30 tablet 3   DUPIXENT 300 MG/2ML prefilled syringe Inject 300 mg into the skin every 14 (fourteen) days.     emtricitabine -tenofovir  (TRUVADA ) 200-300 MG tablet TAKE 1 TABLET BY MOUTH DAILY 90 tablet 1   esomeprazole  (NEXIUM ) 40 MG capsule Take 40 mg by mouth in the morning.     famotidine (PEPCID) 40 MG tablet Take 40 mg by mouth daily as needed for heartburn or indigestion.     ferrous sulfate  (FEROSUL) 325 (65 FE) MG tablet Take 1 tablet (325 mg total) by mouth daily with breakfast. 30 tablet 3   furosemide  (LASIX ) 20 MG tablet Take 1 tablet (20 mg total) by mouth daily. As needed for edema 90 tablet 3   ipratropium (ATROVENT ) 0.06 % nasal spray Place 2 sprays into the nose 2 (two) times daily. (Patient taking differently: Place 2 sprays into the nose daily as needed for rhinitis.) 15 mL 11   ipratropium-albuterol  (DUONEB) 0.5-2.5 (3) MG/3ML SOLN Take 3 mLs by nebulization  every 6 (six) hours as needed (Shortness of breath or wheezing).     metoprolol  succinate (TOPROL -XL) 50 MG 24 hr tablet Take 1 tablet (50 mg total) by mouth daily. Take with or immediately following a meal. 90 tablet 3   mirabegron ER (MYRBETRIQ) 25 MG TB24 tablet Take 25 mg by mouth daily as needed (bladder spasm).     montelukast  (SINGULAIR ) 10 MG tablet TAKE 1 TABLET(10 MG) BY MOUTH AT BEDTIME 90 tablet 3   olopatadine  (PATADAY ) 0.1 % ophthalmic solution Place 1 drop into both eyes 2 (two) times daily as needed for allergies. 5 mL 12   Olopatadine -Mometasone  ([name]) [id number]  MCG/ACT SUSP Place 1 spray into the nose in the morning and at bedtime.     ondansetron  (ZOFRAN -ODT) 4 MG disintegrating tablet DISSOLVE 1 TABLET(4 MG) ON THE TONGUE EVERY 8 HOURS AS NEEDED FOR NAUSEA OR VOMITING 20 tablet 3   pantoprazole  (PROTONIX ) 40 MG tablet Take 1 tablet (40 mg total) by mouth daily. 180 tablet 1   Potassium Chloride  ER 20 MEQ TBCR Take 1 tablet (20 mEq total) by mouth daily. 90 tablet 3   pramoxine-hydrocortisone  (PROCTOCREAM-HC) 1-1 % rectal cream Place rectally 3 (three) times daily. 30 g 1   rizatriptan  (MAXALT -MLT) 10 MG disintegrating tablet DISSOLVE 1 TABLETS IN MOUTH AS NEEDED FOR HEADACH MAY REPEAT IN 2 HOURS AS NEEDED 9 tablet 0   rosuvastatin  (CRESTOR ) 10 MG tablet Take 1 tablet (10 mg total) by mouth daily. 90 tablet 0   sildenafil  (VIAGRA ) 100 MG tablet Take 1/2-1 tablet by mouth once daily as needed prior to sexual activity *MUST KEEP UPCOMING APPT* 90 tablet 3   Spacer/Aero-Holding Chambers DEVI Use with inhaler 1 each 0   tadalafil  (CIALIS ) 5 MG tablet Take 5 mg by mouth daily.     tamsulosin  (FLOMAX ) 0.4 MG CAPS capsule TAKE 1 CAPSULE(0.4 MG) BY MOUTH DAILY AS NEEDED 90 capsule 0   testosterone  cypionate (DEPOTESTOSTERONE CYPIONATE) 200 MG/ML injection Inject 100 mg into the muscle once a week.     Vitamin D , Ergocalciferol , (DRISDOL ) 1.25 MG (50000 UNIT) CAPS capsule TAKE ONE CAPSULE  BY MOUTH EVERY 7 DAYS 15 capsule 1   zolpidem  (AMBIEN ) 10 MG tablet TAKE 1 TABLET(10 MG) BY MOUTH AT BEDTIME 90 tablet 0   [DISCONTINUED] dicyclomine  (BENTYL ) 20 MG tablet Take 20 mg by mouth 3 (three) times daily before meals.     No current facility-administered medications on file prior to visit.    Allergies  Allergen Reactions   Desloratadine Other (See Comments)    CLARINEX-severe headache CLARITIN- severe headache   Loratadine Other (See Comments)    severe headache   Ketek [Telithromycin] Itching and Rash   Levbid  [Hyoscyamine  Sulfate] Rash    Blood pressure 122/82, pulse 71.   Assessment/Plan:     1. Hypertension -  Essential hypertension Assessment: Blood pressure appears controlled since stopping Entresto  He has had 1 episode where he is woken up with what felt like his heart racing since metoprolol  was decreased.  He will let us  know if these episodes continue He has diastolic dysfunction normal EF.  Therefore he really only needs additional medication if blood pressure not controlled Would add ARB if needed  Plan: Continue metoprolol  succinate 50 mg daily and furosemide  20 mg daily Patient to let us  know if issues with tachycardia persists or blood pressure starts to trend high    Thank you  Randall Reed D Randall Reed, Pharm.Randall Reed, CPP [facility] [address] [place], [place] [address]  Phone: [phone]; Fax: [phone]

## 2024-05-14 DIAGNOSIS — J3089 Other allergic rhinitis: Secondary | ICD-10-CM | POA: Diagnosis not present

## 2024-05-14 DIAGNOSIS — M6281 Muscle weakness (generalized): Secondary | ICD-10-CM | POA: Diagnosis not present

## 2024-05-14 DIAGNOSIS — M62838 Other muscle spasm: Secondary | ICD-10-CM | POA: Diagnosis not present

## 2024-05-14 DIAGNOSIS — S30821A Blister (nonthermal) of abdominal wall, initial encounter: Secondary | ICD-10-CM | POA: Diagnosis not present

## 2024-05-14 DIAGNOSIS — R102 Pelvic and perineal pain: Secondary | ICD-10-CM | POA: Diagnosis not present

## 2024-05-14 DIAGNOSIS — M6289 Other specified disorders of muscle: Secondary | ICD-10-CM | POA: Diagnosis not present

## 2024-05-14 DIAGNOSIS — J301 Allergic rhinitis due to pollen: Secondary | ICD-10-CM | POA: Diagnosis not present

## 2024-05-14 DIAGNOSIS — L989 Disorder of the skin and subcutaneous tissue, unspecified: Secondary | ICD-10-CM | POA: Diagnosis not present

## 2024-05-14 NOTE — Progress Notes (Signed)
 Ellouise Console, PA-C [address] [place], [place]  [address] Phone: [phone]   Primary Care Physician: Levora Reyes SAUNDERS, MD  Primary Gastroenterologist:  Ellouise Console, PA-C / Elspeth Naval, MD   Chief Complaint:  Iron Deficiency Anemia       HPI:   Randall Sesay. is a 53 y.o. male, previous patient Dr. Aneita, is here to evaluate iron deficiency anemia.  PMH: CAD, hypertension, asthma, sleep apnea, GERD, IBS-D, diverticulosis.  S/p sigmoid colectomy 10/2023 by Dr. Lonni Pizza for sigmoid colon stricture.  Current symptoms: Long history of acid reflux for many years.  Currently controlled on twice daily PPI.  He admits to left upper quadrant and left lower quadrant abdominal pain with abdominal bloating and swelling.  Left side abdominal pain is worse with bowel movement and is mild.  He feels like his colon is full.  Occasionally passes undigested food such as raw vegetables.  He denies NSAID use.  And has occasional loose stools.  Occasional dark stools.  Denies bright red rectal bleeding.  Denies unintentional weight loss.  Admits to weight gain.  Currently takes 81 mg aspirin  daily.  Also takes Celebrex 200 mg daily.  Is on Nexium  40mg  QAM or pantoprazole  and 40 Mg Q PM, Pepcid 40 Mg daily, and ferrous sulfate  325 mg daily.  05/01/2024 labs: Low iron 41, iron saturation 11%, ferritin 15.  Low vitamin B12 174.  Normal hemoglobin 15.3, platelets 179, WBC 5.9.  Normal CMP.  07/2022 EGD by Dr. Aneita: Mild gastritis, otherwise normal.  Treated with pantoprazole  40 Mg twice daily and famotidine 40 Mg nightly.  07/2022 colonoscopy: 1 small 6 mm ulcer at the ileocecal valve without bleeding.  Left-sided diverticulosis with narrowing of the colon.  Diverticular spasm.  Benign moderate stenosis measuring 3 cm in the sigmoid colon associated with diverticulosis.  Small external hemorrhoids.  No polyps.  Prep was adequate after extensive lavage.  10-year repeat.  Treated  colon spasm with dicyclomine  20 Mg 3 times daily.  07/2022 EGD / Colon Pathology: 1. 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 Surgical [P], gastric, antrum and body - GASTRIC ANTRAL AND OXYNTIC MUCOSA WITH FEATURES OF REACTIVE GASTROPATHY - NEGATIVE FOR H. PYLORI ON H&E STAIN - NEGATIVE FOR INTESTINAL METAPLASIA OR MALIGNANCY   Current Outpatient Medications  Medication Sig Dispense Refill   acetaminophen  (TYLENOL ) 500 MG tablet Take 1,000 mg by mouth every 6 (six) hours as needed for mild pain (pain score 1-3).     AIRSUPRA  90-80 MCG/ACT AERO Inhale 2 puffs into the lungs every 4 (four) hours as needed.     albuterol  (VENTOLIN  HFA) 108 (90 Base) MCG/ACT inhaler INHALE 2 PUFFS INTO THE LUNGS EVERY 6 HOURS AS NEEDED FOR WHEEZING OR SHORTNESS OF BREATH 8.5 g 0   ALLEGRA ALLERGY 180 MG tablet Take 180 mg by mouth daily.     ALPRAZolam  (XANAX ) 1 MG tablet TAKE 1 TABLET BY MOUTH TWICE DAILY AS NEEDED 60 tablet 0   anastrozole (ARIMIDEX) 1 MG tablet Take 0.5 mg by mouth once a week.     aspirin  EC 81 MG tablet Take 1 tablet (81 mg total) by mouth daily.     Budeson-Glycopyrrol-Formoterol  (BREZTRI AEROSPHERE) 160-9-4.8 MCG/ACT AERO Inhale 2 puffs into the lungs in the morning and at bedtime.     celecoxib (CELEBREX) 200 MG capsule Take 200  mg by mouth 2 (two) times daily as needed for moderate pain (pain score 4-6). PRN     cyanocobalamin  (VITAMIN B12) 1000 MCG tablet Take 1 tablet (1,000 mcg total) by mouth daily. 30 tablet 3   DUPIXENT 300 MG/2ML prefilled syringe Inject 300 mg into the skin every 14 (fourteen) days.     emtricitabine -tenofovir  (TRUVADA ) 200-300 MG tablet TAKE 1 TABLET BY MOUTH DAILY 90 tablet 1   esomeprazole  (NEXIUM ) 40 MG capsule Take 40 mg by mouth in the morning.     famotidine (PEPCID) 40 MG tablet Take 40 mg by mouth daily as needed for  heartburn or indigestion.     ferrous sulfate  (FEROSUL) 325 (65 FE) MG tablet Take 1 tablet (325 mg total) by mouth daily with breakfast. 30 tablet 3   furosemide  (LASIX ) 20 MG tablet Take 1 tablet (20 mg total) by mouth daily. As needed for edema 90 tablet 3   ipratropium (ATROVENT ) 0.06 % nasal spray Place 2 sprays into the nose 2 (two) times daily. (Patient taking differently: Place 2 sprays into the nose daily as needed for rhinitis.) 15 mL 11   ipratropium-albuterol  (DUONEB) 0.5-2.5 (3) MG/3ML SOLN Take 3 mLs by nebulization every 6 (six) hours as needed (Shortness of breath or wheezing).     metoprolol  succinate (TOPROL -XL) 50 MG 24 hr tablet Take 1 tablet (50 mg total) by mouth daily. Take with or immediately following a meal. 90 tablet 3   mirabegron ER (MYRBETRIQ) 25 MG TB24 tablet Take 25 mg by mouth daily as needed (bladder spasm).     montelukast  (SINGULAIR ) 10 MG tablet TAKE 1 TABLET(10 MG) BY MOUTH AT BEDTIME 90 tablet 3   Na Sulfate-K Sulfate-Mg Sulfate concentrate (SUPREP) 17.5-3.13-1.6 GM/177ML SOLN Take 1 kit (354 mLs total) by mouth once for 1 dose. 354 mL 0   olopatadine  (PATADAY ) 0.1 % ophthalmic solution Place 1 drop into both eyes 2 (two) times daily as needed for allergies. 5 mL 12   Olopatadine -Mometasone  ([name]) 665-25 MCG/ACT SUSP Place 1 spray into the nose in the morning and at bedtime.     ondansetron  (ZOFRAN -ODT) 4 MG disintegrating tablet DISSOLVE 1 TABLET(4 MG) ON THE TONGUE EVERY 8 HOURS AS NEEDED FOR NAUSEA OR VOMITING 20 tablet 3   pantoprazole  (PROTONIX ) 40 MG tablet Take 1 tablet (40 mg total) by mouth daily. 180 tablet 1   Potassium Chloride  ER 20 MEQ TBCR Take 1 tablet (20 mEq total) by mouth daily. 90 tablet 3   pramoxine-hydrocortisone  (PROCTOCREAM-HC) 1-1 % rectal cream Place rectally 3 (three) times daily. 30 g 1   rizatriptan  (MAXALT -MLT) 10 MG disintegrating tablet DISSOLVE 1 TABLETS IN MOUTH AS NEEDED FOR HEADACH MAY REPEAT IN 2 HOURS AS NEEDED 9  tablet 0   rosuvastatin  (CRESTOR ) 10 MG tablet Take 1 tablet (10 mg total) by mouth daily. 90 tablet 0   sildenafil  (VIAGRA ) 100 MG tablet Take 1/2-1 tablet by mouth once daily as needed prior to sexual activity *MUST KEEP UPCOMING APPT* 90 tablet 3   Spacer/Aero-Holding Chambers DEVI Use with inhaler 1 each 0   tadalafil  (CIALIS ) 5 MG tablet Take 5 mg by mouth daily.     tamsulosin  (FLOMAX ) 0.4 MG CAPS capsule TAKE 1 CAPSULE(0.4 MG) BY MOUTH DAILY AS NEEDED 90 capsule 0   testosterone  cypionate (DEPOTESTOSTERONE CYPIONATE) 200 MG/ML injection Inject 100 mg into the muscle once a week.     Vitamin D , Ergocalciferol , (DRISDOL ) 1.25 MG (50000 UNIT) CAPS capsule TAKE ONE CAPSULE BY MOUTH  EVERY 7 DAYS 15 capsule 1   zolpidem  (AMBIEN ) 10 MG tablet TAKE 1 TABLET(10 MG) BY MOUTH AT BEDTIME 90 tablet 0   No current facility-administered medications for this visit.    Allergies as of 05/15/2024 - Review Complete 05/15/2024  Allergen Reaction Noted   Desloratadine Other (See Comments)    Loratadine Other (See Comments)    Ketek [telithromycin] Itching and Rash    Levbid  [hyoscyamine  sulfate] Rash 02/04/2012    Past Medical History:  Diagnosis Date   Anxiety    Asthma    Complication of anesthesia    oxygen levelds dropped  with colonoscopy   Depression    Dyspnea    sometimes with asthma has sob   GERD (gastroesophageal reflux disease)    Headache    Hyperlipidemia    Phreesia 12/05/2020   Pneumonia    Seasonal allergic reaction    Sleep apnea     Past Surgical History:  Procedure Laterality Date   CIRCUMCISION     COLONOSCOPY     FLEXIBLE SIGMOIDOSCOPY N/A 10/11/2023   Procedure: FLEXIBLE SIGMOIDOSCOPY;  Surgeon: Teresa Lonni HERO, MD;  Location: [facility];  Service: General;  Laterality: N/A;   LIPOMA EXCISION N/A 10/30/2018   Procedure: EXCISION OF MULTIPLE SUBCUTANEOUS LIPOMAS ON TORSO;  Surgeon: Belinda Cough, MD;  Location: [facility];  Service: General;   Laterality: N/A;   XI ROBOTIC ASSISTED LOWER ANTERIOR RESECTION N/A 10/11/2023   Procedure: XI ROBOTIC ASSISTED LOWER ANTERIOR RESECTION, SIGMOIDECTOMY WITH INTRAOPERATIVE ASSESSMENT OF PERFUSION ICG, BILATERAL TAP BLOCK;  Surgeon: Teresa Lonni HERO, MD;  Location: [facility];  Service: General;  Laterality: N/A;  180    Review of Systems:    All systems reviewed and negative except where noted in HPI.    Physical Exam:  BP 122/70   Pulse 87   Ht 5' 10.5 (1.791 m)   Wt 268 lb (121.6 kg)   BMI 37.91 kg/m  No LMP for male patient.  General: Well-nourished, well-developed in no acute distress.  Lungs: Clear to auscultation bilaterally. Non-labored. Heart: Regular rate and rhythm, no murmurs rubs or gallops.  Abdomen: Bowel sounds are normal; Abdomen is Soft and obese; No hepatosplenomegaly, masses or hernias; mild LUQ and LLQ abdominal Tenderness; no right side abdominal tenderness.  No guarding or rebound tenderness. Neuro: Alert and oriented x 3.  Grossly intact.  Psych: Alert and cooperative, anxious mood and affect.   Imaging Studies: No results found.  Labs: CBC    Component Value Date/Time   WBC 5.9 05/01/2024 0909   RBC 6.03 (H) 05/01/2024 0909   HGB 15.3 05/01/2024 0909   HGB 18.7 (H) 04/04/2018 1741   HCT 47.3 05/01/2024 0909   HCT 52.8 (H) 04/04/2018 1741   PLT 179.0 05/01/2024 0909   PLT 231 04/04/2018 1741   MCV 78.3 05/01/2024 0909   MCV 88 04/04/2018 1741   MCH 27.7 10/14/2023 0431   MCHC 32.3 05/01/2024 0909   RDW 18.4 (H) 05/01/2024 0909   RDW 13.6 04/04/2018 1741   LYMPHSABS 1.3 05/01/2024 0909   MONOABS 0.5 05/01/2024 0909   EOSABS 0.0 05/01/2024 0909   BASOSABS 0.0 05/01/2024 0909    CMP     Component Value Date/Time   NA 138 05/01/2024 0909   NA 140 03/18/2024 0913   K 4.1 05/01/2024 0909   CL 105 05/01/2024 0909   CO2 24 05/01/2024 0909   GLUCOSE 84 05/01/2024 0909   BUN 13 05/01/2024 0909  BUN 13 03/18/2024 0913   CREATININE 1.14  05/01/2024 0909   CREATININE 1.15 07/04/2023 1225   CALCIUM  8.9 05/01/2024 0909   PROT 6.6 05/01/2024 0909   PROT 7.0 04/08/2023 1451   ALBUMIN 4.2 05/01/2024 0909   ALBUMIN 4.6 04/08/2023 1451   AST 18 05/01/2024 0909   ALT 19 05/01/2024 0909   ALKPHOS 59 05/01/2024 0909   BILITOT 0.6 05/01/2024 0909   BILITOT 0.5 04/08/2023 1451   GFRNONAA >60 10/14/2023 0431   GFRAA 74 10/14/2020 1143       Assessment and Plan:   Randall Reed. is a 53 y.o. y/o male presents for:  1.  Iron deficiency anemia and B12 deficiency: Possible adverse side effect of high-dose PPI?  Rule out inflammatory bowel disease, gastritis, peptic ulcer.  He has some LUQ and LLQ abdominal pain.  Some loose stools.  Last colonoscopy showed ileocecal ulcer.  Last EGD showed mild gastritis (H. pylori negative). -Fecal calprotectin -Abdominal pelvic CT enterography - Scheduling EGD & Colonoscopy I discussed risks of EGD and colonoscopy with patient to include risk of bleeding, perforation, and risk of sedation.  Patient expressed understanding and agrees to proceed with procedures.   - Continue oral iron and vitamin B12 once daily for now. - He has follow-up with PCP in the next 4 weeks to repeat labs.  2.  GERD: Controlled on high-dose PPI - Continue Nexium  40 Mg every morning and pantoprazole  40 Mg every afternoon - Recommend Lifestyle Modifications to prevent Acid Reflux.  Rec. Avoid coffee, sodas, peppermint, garlic, onions, alcohol, citrus fruits, chocolate, tomatoes, fatty and spicey foods.  Avoid eating 2-3 hours before bedtime.    3.  Irritable bowel syndrome, diarrhea predominant - Currently asymptomatic.  4.  History of severe diverticulosis with sigmoid colon stricture s/p sigmoid colectomy 10/2023.  Appears to be well-healed. - I sent a message to general surgeon Dr. Lonni Pizza to make sure we are safe to proceed with colonoscopy per patient's request.  5.  Comorbidities: CAD,  hypertension, asthma, sleep apnea, Anxiety.   Ellouise Console, PA-C  Follow up with Dr. Leigh 4 weeks after EGD and colonoscopy procedures.

## 2024-05-15 ENCOUNTER — Encounter: Payer: Self-pay | Admitting: Physician Assistant

## 2024-05-15 ENCOUNTER — Other Ambulatory Visit

## 2024-05-15 ENCOUNTER — Telehealth: Payer: Self-pay | Admitting: Physician Assistant

## 2024-05-15 ENCOUNTER — Ambulatory Visit: Admitting: Physician Assistant

## 2024-05-15 VITALS — BP 122/70 | HR 87 | Ht 70.5 in | Wt 268.0 lb

## 2024-05-15 DIAGNOSIS — K633 Ulcer of intestine: Secondary | ICD-10-CM

## 2024-05-15 DIAGNOSIS — D509 Iron deficiency anemia, unspecified: Secondary | ICD-10-CM

## 2024-05-15 DIAGNOSIS — R197 Diarrhea, unspecified: Secondary | ICD-10-CM

## 2024-05-15 DIAGNOSIS — K219 Gastro-esophageal reflux disease without esophagitis: Secondary | ICD-10-CM

## 2024-05-15 DIAGNOSIS — E538 Deficiency of other specified B group vitamins: Secondary | ICD-10-CM

## 2024-05-15 DIAGNOSIS — Z9049 Acquired absence of other specified parts of digestive tract: Secondary | ICD-10-CM

## 2024-05-15 DIAGNOSIS — Z8719 Personal history of other diseases of the digestive system: Secondary | ICD-10-CM

## 2024-05-15 DIAGNOSIS — R1032 Left lower quadrant pain: Secondary | ICD-10-CM

## 2024-05-15 DIAGNOSIS — R1012 Left upper quadrant pain: Secondary | ICD-10-CM

## 2024-05-15 MED ORDER — NA SULFATE-K SULFATE-MG SULF 17.5-3.13-1.6 GM/177ML PO SOLN: 1.0000 | Freq: Once | ORAL | 0 refills | Status: AC

## 2024-05-15 NOTE — Progress Notes (Signed)
 Agree with assessment and plan as outlined.

## 2024-05-15 NOTE — Telephone Encounter (Signed)
 Sent note to surgeon Dr. Lonni Pizza for approval to proceed with colonoscopy. Ellouise Console, PA-C

## 2024-05-15 NOTE — Patient Instructions (Signed)
 Your provider has requested that you go to the basement level for lab work before leaving today. Press B on the elevator. The lab is located at the first door on the left as you exit the elevator.  You have been scheduled for a CT scan of the abdomen and pelvis at [facility], 1st floor Radiology. You are scheduled on 05/19/24 at 6:30am. You should arrive 15 minutes prior to your appointment time for registration.   If you have any questions regarding your exam or if you need to reschedule, you may call [facility] at [phone] between the hours of 8:00 am and 5:00 pm, Monday-Friday.   You have been scheduled for an Endoscopy and Colonoscopy. Please follow the written instructions given to you at your visit today.  If you use inhalers (even only as needed), please bring them with you on the day of your procedure.  DO NOT TAKE 7 DAYS PRIOR TO TEST- Trulicity (dulaglutide) Ozempic, Wegovy (semaglutide) Mounjaro (tirzepatide)0 Bydureon Bcise (exanatide extended release)  DO NOT TAKE 1 DAY PRIOR TO YOUR TEST Rybelsus (semaglutide) Adlyxin (lixisenatide) Victoza (liraglutide) Byetta (exanatide) ___________________________________________________________________________  Please follow up sooner if symptoms increase or worsen __________________________________________________________________________  Due to recent changes in healthcare laws, you may see the results of your imaging and laboratory studies on MyChart before your provider has had a chance to review them.  We understand that in some cases there may be results that are confusing or concerning to you. Not all laboratory results come back in the same time frame and the provider may be waiting for multiple results in order to interpret others.  Please give us  48 hours in order for your provider to thoroughly review all the results before contacting the office for clarification of your results.   Thank you for  trusting me with your gastrointestinal care!   Ellouise Console, PA-C _______________________________________________________  If your blood pressure at your visit was 140/90 or greater, please contact your primary care physician to follow up on this.  _______________________________________________________  If you are age 55 or older, your body mass index should be between 23-30. Your Body mass index is 37.91 kg/m. If this is out of the aforementioned range listed, please consider follow up with your Primary Care Provider.  If you are age 49 or younger, your body mass index should be between 19-25. Your Body mass index is 37.91 kg/m. If this is out of the aformentioned range listed, please consider follow up with your Primary Care Provider.   ________________________________________________________  The [facility] providers would like to encourage you to use MYCHART to communicate with providers for non-urgent requests or questions.  Due to long hold times on the telephone, sending your provider a message by [facility] may be a faster and more efficient way to get a response.  Please allow 48 business hours for a response.  Please remember that this is for non-urgent requests.  _______________________________________________________

## 2024-05-15 NOTE — Telephone Encounter (Signed)
 Sounds good. Thanks [employer]

## 2024-05-19 ENCOUNTER — Ambulatory Visit: Payer: Self-pay | Admitting: Physician Assistant

## 2024-05-19 ENCOUNTER — Ambulatory Visit (HOSPITAL_COMMUNITY)
Admission: RE | Admit: 2024-05-19 | Discharge: 2024-05-19 | Disposition: A | Discharge status: Home / Self Care | Source: Ambulatory Visit | Attending: Physician Assistant | Admitting: Physician Assistant

## 2024-05-19 DIAGNOSIS — D509 Iron deficiency anemia, unspecified: Secondary | ICD-10-CM | POA: Insufficient documentation

## 2024-05-19 DIAGNOSIS — K7689 Other specified diseases of liver: Secondary | ICD-10-CM | POA: Diagnosis not present

## 2024-05-19 DIAGNOSIS — R1032 Left lower quadrant pain: Secondary | ICD-10-CM | POA: Diagnosis not present

## 2024-05-19 DIAGNOSIS — R1012 Left upper quadrant pain: Secondary | ICD-10-CM | POA: Insufficient documentation

## 2024-05-19 DIAGNOSIS — K5730 Diverticulosis of large intestine without perforation or abscess without bleeding: Secondary | ICD-10-CM | POA: Diagnosis not present

## 2024-05-19 DIAGNOSIS — R109 Unspecified abdominal pain: Secondary | ICD-10-CM | POA: Diagnosis not present

## 2024-05-19 DIAGNOSIS — K633 Ulcer of intestine: Secondary | ICD-10-CM | POA: Diagnosis not present

## 2024-05-19 DIAGNOSIS — K429 Umbilical hernia without obstruction or gangrene: Secondary | ICD-10-CM | POA: Diagnosis not present

## 2024-05-19 LAB — CALPROTECTIN, FECAL: Calprotectin, Fecal: 112 ug/g (ref 0–120)

## 2024-05-19 MED ORDER — IOHEXOL 300 MG/ML  SOLN
100.0000 mL | Freq: Once | INTRAMUSCULAR | Status: AC | PRN
  Administered 2024-05-19: 100 mL via INTRAVENOUS

## 2024-05-22 ENCOUNTER — Other Ambulatory Visit: Payer: Self-pay | Admitting: Family Medicine

## 2024-05-22 DIAGNOSIS — F411 Generalized anxiety disorder: Secondary | ICD-10-CM

## 2024-05-25 MED ORDER — ONDANSETRON 4 MG PO TBDP: 4.0000 mg | ORAL_TABLET | Freq: Three times a day (TID) | ORAL | 3 refills | Status: DC | PRN

## 2024-05-25 MED ORDER — DICYCLOMINE HCL 20 MG PO TABS: 20.0000 mg | ORAL_TABLET | Freq: Two times a day (BID) | ORAL | 2 refills | Status: AC

## 2024-05-25 NOTE — Telephone Encounter (Signed)
 Ellouise,  May I refill patient's zofran ? Last given by Dr Aneita 08/2023.

## 2024-05-25 NOTE — Telephone Encounter (Signed)
 Name of Medication: Xanax  1mg  Name of Pharmacy: [id number] - [place], [place] - [address] AT [facility] OF [address]  Last Fill or Written Date and Quantity: 04/23/24 60tab 0refills Last Office Visit and Type: 05/01/24 follow up on numerous issues Next Office Visit and Type: 06/03/2024 Last Controlled Substance Agreement Date: none Last UDS: none

## 2024-05-25 NOTE — Telephone Encounter (Signed)
 Medication discussed at his June 27 visit.  Controlled substance database reviewed.  #60 alprazolam  last filled on 04/30/2024.  Refill ordered.

## 2024-05-28 DIAGNOSIS — J3089 Other allergic rhinitis: Secondary | ICD-10-CM | POA: Diagnosis not present

## 2024-05-28 DIAGNOSIS — J4550 Severe persistent asthma, uncomplicated: Secondary | ICD-10-CM | POA: Diagnosis not present

## 2024-05-28 DIAGNOSIS — J301 Allergic rhinitis due to pollen: Secondary | ICD-10-CM | POA: Diagnosis not present

## 2024-05-29 ENCOUNTER — Ambulatory Visit: Admitting: Physician Assistant

## 2024-05-30 ENCOUNTER — Other Ambulatory Visit: Payer: Self-pay | Admitting: Cardiovascular Disease

## 2024-05-30 DIAGNOSIS — E782 Mixed hyperlipidemia: Secondary | ICD-10-CM

## 2024-05-30 DIAGNOSIS — I10 Essential (primary) hypertension: Secondary | ICD-10-CM

## 2024-05-30 DIAGNOSIS — I25118 Atherosclerotic heart disease of native coronary artery with other forms of angina pectoris: Secondary | ICD-10-CM

## 2024-06-01 ENCOUNTER — Other Ambulatory Visit: Payer: Self-pay | Admitting: Family Medicine

## 2024-06-01 DIAGNOSIS — N529 Male erectile dysfunction, unspecified: Secondary | ICD-10-CM | POA: Diagnosis not present

## 2024-06-01 DIAGNOSIS — Z125 Encounter for screening for malignant neoplasm of prostate: Secondary | ICD-10-CM | POA: Diagnosis not present

## 2024-06-01 DIAGNOSIS — R102 Pelvic and perineal pain: Secondary | ICD-10-CM | POA: Diagnosis not present

## 2024-06-01 DIAGNOSIS — G43109 Migraine with aura, not intractable, without status migrainosus: Secondary | ICD-10-CM

## 2024-06-01 DIAGNOSIS — E291 Testicular hypofunction: Secondary | ICD-10-CM | POA: Diagnosis not present

## 2024-06-01 DIAGNOSIS — R350 Frequency of micturition: Secondary | ICD-10-CM | POA: Diagnosis not present

## 2024-06-03 ENCOUNTER — Ambulatory Visit: Admitting: Family Medicine

## 2024-06-03 ENCOUNTER — Encounter: Payer: Self-pay | Admitting: Family Medicine

## 2024-06-03 VITALS — BP 112/90 | HR 80 | Temp 98.1°F | Ht 71.0 in | Wt 268.2 lb

## 2024-06-03 DIAGNOSIS — R5383 Other fatigue: Secondary | ICD-10-CM | POA: Diagnosis not present

## 2024-06-03 DIAGNOSIS — R7309 Other abnormal glucose: Secondary | ICD-10-CM | POA: Diagnosis not present

## 2024-06-03 DIAGNOSIS — E538 Deficiency of other specified B group vitamins: Secondary | ICD-10-CM | POA: Diagnosis not present

## 2024-06-03 DIAGNOSIS — E611 Iron deficiency: Secondary | ICD-10-CM

## 2024-06-03 LAB — CBC
HCT: 51.4 % (ref 39.0–52.0)
Hemoglobin: 16.8 g/dL (ref 13.0–17.0)
MCHC: 32.8 g/dL (ref 30.0–36.0)
MCV: 80.3 fl (ref 78.0–100.0)
Platelets: 197 K/uL (ref 150.0–400.0)
RBC: 6.39 Mil/uL — ABNORMAL HIGH (ref 4.22–5.81)
RDW: 19.3 % — ABNORMAL HIGH (ref 11.5–15.5)
WBC: 7.2 K/uL (ref 4.0–10.5)

## 2024-06-03 LAB — VITAMIN B12: Vitamin B-12: 1483 pg/mL — ABNORMAL HIGH (ref 211–911)

## 2024-06-03 NOTE — Progress Notes (Signed)
 Subjective:  Patient ID: Randall JONETTA Floria Mickey., male    DOB: 09/03/1971  Age: 53 y.o. MRN: [id number]  CC:  Chief Complaint  Patient presents with   Fatigue    States he wants to discuss he hasn't gotten to the bottom of the iron problem. And states wanting to discuss A1c per messages.     HPI Randall Reed. presents for  Follow-up from June 27 visit.  Fatigue  see last visit and phone notes.  Medications had been adjusted by cardiology with possible contribution to fatigue symptoms.  Meds adjusted including stopping Entresto  with improved blood pressure based on review of July 7 pharmacy visit.  Diastolic dysfunction with normal EF, only needs additional medication if blood pressure is not controlled.  Considered adding ARB if needed.  Continued on metoprolol  50 mg daily, furosemide  20 mg daily. He has noted diastolics increasing - close to 90 - plans to d/w cardiology regarding changes.   Also has been followed by endocrinology, ferritin 7.6 in June.  Normal hemoglobin at 15.5.  Started on iron supplementation.  Prior: Can be September 2023 with endoscopy, colonoscopy with single solitary ulcer at ileocecal valve that was biopsy, severe diverticulosis in the left colon, stricture in the sigmoid colon with subsequent sigmoidectomy in December 2024.  Dr. Teresa.  Did note occasional dark stools that his June 27 visit, plan for gastroenterology and surgery follow-up.  B12 was low at 174, normal folate, normal hemoglobin on June 27 labs.  Iron was low at 41, percent sat 11, ferritin 15.  Over-the-counter iron and B12 supplementation was started. On iron 325mg  every day - dark stools. Also on b12 1000mcg every day.   GI evaluation on July 11.  Fecal calprotectin ordered, CT enterography EGD and colonoscopy planned.  Was continued on oral iron and vitamin B-12, plan for repeat labs today.  Continued on Nexium  40 mg daily, pantoprazole  40 mg in afternoon.  Dicyclomine  for abdominal cramping,  Zofran  if needed for nausea. Had CT, per GI: 1.  Previous sigmoid colon surgery.  Mild stranding along the sigmoid colon thought to be postsurgical. 2.  Normal small intestine and terminal ileum.  No evidence of inflammation in the small intestine. 3.  Diverticulosis, but NO evidence of diverticulitis infection. The CT results are reassuring Plans to discuss colonoscopy with Dr. Teresa first - appt next week.   Elevated hemoglobin A1c 5.8 on June 27, previously 5.5 in December of last year.  Family history of DM. Plans continued monitoring.  Exercising 3-4 days per week.  Min sweet tea. Limits fast food and sodas.    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   Fatigue 03/12/2021   Change in bowel habit 10/03/2020   Early satiety 10/03/2020   History of colonic polyps 10/03/2020   Screening for malignant neoplasm of colon 10/03/2020   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   Diarrhea 10/03/2020   Voice disturbance 03/12/2019   Coronary artery disease involving native coronary artery of native heart without angina pectoris 03/05/2019   Irritable bowel syndrome 03/14/2016   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   Asthma 11/19/2015   Sleep  apnea 11/19/2015   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 [facility]) 06/04/2014   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   rotator cuff tear 10/17/2013   Hypogonadism male 09/04/2013   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   ED (erectile dysfunction) 07/30/2013   Male erectile dysfunction, unspecified 07/30/2013   Biceps tendonitis 06/15/2013   Incomplete  tear of rotator cuff 06/15/2013   Pain in upper limb 06/15/2013   Bilateral knee pain 11/07/2012   Right shoulder pain 11/07/2012   Palpitations 02/21/2012   BMI 33.0-33.9,adult 02/04/2012   Allergic rhinitis 02/04/2012   GERD (gastroesophageal reflux disease) 02/04/2012   Migraine 02/04/2012   GAD (generalized anxiety disorder) 02/04/2012   Insomnia 02/04/2012   Obesity 02/04/2012   Gastro-esophageal reflux disease without esophagitis 02/04/2012   Essential hypertension 04/13/2009   CHEST PAIN-UNSPECIFIED 04/13/2009   Abdominal pain 04/13/2009   Past Medical History:  Diagnosis Date   Anxiety    Asthma    Complication of anesthesia    oxygen levelds dropped  with colonoscopy   Depression    Dyspnea    sometimes with asthma has sob   GERD (gastroesophageal reflux disease)    Headache    Hyperlipidemia    Phreesia 12/05/2020   Pneumonia    Seasonal allergic reaction    Sleep apnea    Past Surgical History:  Procedure Laterality Date   CIRCUMCISION     COLONOSCOPY     FLEXIBLE SIGMOIDOSCOPY N/A 10/11/2023   Procedure: FLEXIBLE SIGMOIDOSCOPY;  Surgeon: Teresa Lonni HERO, MD;  Location: [facility];  Service: General;  Laterality: N/A;   LIPOMA EXCISION N/A 10/30/2018   Procedure: EXCISION OF MULTIPLE SUBCUTANEOUS LIPOMAS ON TORSO;  Surgeon: Belinda Cough, MD;  Location: [facility];  Service: General;  Laterality: N/A;   XI ROBOTIC ASSISTED LOWER ANTERIOR RESECTION N/A 10/11/2023   Procedure: XI ROBOTIC ASSISTED LOWER ANTERIOR RESECTION, SIGMOIDECTOMY WITH INTRAOPERATIVE ASSESSMENT OF PERFUSION ICG, BILATERAL TAP BLOCK;  Surgeon: Teresa Lonni HERO, MD;  Location: [facility];  Service: General;  Laterality: N/A;  180   Allergies  Allergen Reactions   Desloratadine Other (See Comments)    CLARINEX-severe headache CLARITIN- severe headache   Loratadine Other (See Comments)    severe headache   Ketek [Telithromycin] Itching and Rash   Levbid  [Hyoscyamine   Sulfate] Rash   Prior to Admission medications   Medication Sig Start Date End Date Taking? Authorizing Provider  AIRSUPRA  90-80 MCG/ACT AERO Inhale 2 puffs into the lungs every 4 (four) hours as needed. 03/30/23  Yes [provider]  albuterol  (VENTOLIN  HFA) 108 (90 Base) MCG/ACT inhaler INHALE 2 PUFFS INTO THE LUNGS EVERY 6 HOURS AS NEEDED FOR WHEEZING OR SHORTNESS OF BREATH 07/19/20  Yes Levora Reyes SAUNDERS, MD  ALLEGRA ALLERGY 180 MG tablet Take 180 mg by mouth daily. 07/12/20  Yes [provider]  ALPRAZolam  (XANAX ) 1 MG tablet TAKE 1 TABLET BY MOUTH TWICE DAILY AS NEEDED 05/25/24  Yes Levora Reyes SAUNDERS, MD  anastrozole (ARIMIDEX) 1 MG tablet Take 0.5 mg by mouth once a week.   Yes [provider]  aspirin  EC 81 MG tablet Take 1 tablet (81 mg total) by mouth daily. 03/07/15  Yes Wonda Sharper, MD  Budeson-Glycopyrrol-Formoterol  (BREZTRI AEROSPHERE) 160-9-4.8 MCG/ACT AERO Inhale 2 puffs into the lungs in the morning and at bedtime. 09/14/21  Yes [provider]  celecoxib (CELEBREX) 200 MG capsule Take 200 mg by mouth 2 (two) times daily as needed for moderate pain (pain score 4-6). PRN 10/23/22  Yes [provider]  cyanocobalamin  (VITAMIN B12) 1000 MCG tablet Take 1 tablet (1,000 mcg total) by mouth daily. 05/04/24  Yes Levora Reyes SAUNDERS, MD  dicyclomine  (BENTYL ) 20 MG tablet Take 1 tablet (20 mg total) by mouth in the morning and at bedtime. 05/25/24  Yes [place], PA-C  DUPIXENT 300 MG/2ML prefilled syringe Inject 300 mg into the skin every 14 (fourteen) days. 10/06/21  Yes [provider]  emtricitabine -tenofovir  (TRUVADA ) 200-300 MG tablet TAKE 1 TABLET BY MOUTH DAILY 04/30/24  Yes Levora Reyes SAUNDERS, MD  esomeprazole  (NEXIUM ) 40 MG capsule Take 40 mg by mouth in the morning. 11/07/12  Yes [provider]  famotidine (PEPCID) 40 MG tablet Take 40 mg by mouth daily as needed for heartburn or indigestion. 02/18/20  Yes [provider]  ferrous sulfate  (FEROSUL) 325 (65 FE) MG tablet Take 1 tablet (325 mg total) by mouth daily with breakfast. 05/04/24  Yes Levora Reyes SAUNDERS, MD  furosemide  (LASIX ) 20 MG tablet Take 1 tablet (20 mg total) by mouth daily. As needed for edema 02/03/24  Yes Wonda Sharper, MD  ipratropium (ATROVENT ) 0.06 % nasal spray Place 2 sprays into the nose 2 (two) times daily. Patient taking differently: Place 2 sprays into the nose daily as needed for rhinitis. 05/25/21  Yes Levora Reyes SAUNDERS, MD  ipratropium-albuterol  (DUONEB) 0.5-2.5 (3) MG/3ML SOLN Take 3 mLs by nebulization every 6 (six) hours as needed (Shortness of breath or wheezing). 12/25/21  Yes [provider]  metoprolol  succinate (TOPROL -XL) 50 MG 24 hr tablet Take 1 tablet (50 mg total) by mouth daily. Take with or immediately following a meal. 04/02/24  Yes Wonda Sharper, MD  mirabegron ER (MYRBETRIQ) 25 MG TB24 tablet Take 25 mg by mouth daily as needed (bladder spasm).   Yes [provider]  montelukast  (SINGULAIR ) 10 MG tablet TAKE 1 TABLET(10 MG) BY MOUTH AT BEDTIME 03/23/24  Yes Levora Reyes SAUNDERS, MD  olopatadine  (PATADAY ) 0.1 % ophthalmic solution Place 1 drop into both eyes 2 (two) times daily as needed for allergies. 01/17/24  Yes Jerrell Cleatus Ned, MD  Olopatadine -Mometasone  ([name]) 2192599908 MCG/ACT SUSP Place 1 spray into the nose in the morning and at bedtime.   Yes [provider]  ondansetron  (ZOFRAN -ODT) 4 MG disintegrating tablet Take 1 tablet (4 mg total) by mouth every 8 (eight) hours as needed for nausea or vomiting. 05/25/24  Yes [place], PA-C  pantoprazole  (PROTONIX ) 40 MG tablet Take 1 tablet (40 mg total) by mouth daily. 05/01/24  Yes Levora Reyes SAUNDERS, MD  Potassium Chloride  ER 20 MEQ TBCR TAKE 1 TABLET(20 MEQ) BY MOUTH DAILY 06/01/24  Yes Wonda Sharper, MD  pramoxine-hydrocortisone  (PROCTOCREAM-HC) 1-1 % rectal cream Place rectally 3 (three) times daily. 05/07/22  Yes Levora Reyes SAUNDERS, MD  rizatriptan  (MAXALT -MLT) 10 MG disintegrating tablet DISSOLVE 1 TABLET IN MOUTH AS NEEDED FOR HEADACHE. MAY  REPEAT  IN  2  HOURS  AS  NEEDED. 06/01/24  Yes Levora Reyes SAUNDERS, MD  rosuvastatin  (CRESTOR ) 10 MG tablet Take 1 tablet (10 mg total) by mouth daily. 04/01/24  Yes Wonda Sharper, MD  sildenafil  (VIAGRA ) 100 MG tablet Take 1/2-1 tablet by mouth once daily as needed prior to sexual activity *MUST KEEP UPCOMING APPT* 04/02/23  Yes Wonda Sharper, MD  Spacer/Aero-Holding Chambers DEVI Use with inhaler 05/15/22  Yes Icard, Adine CROME, DO  tadalafil  (CIALIS ) 5 MG tablet Take 5 mg by mouth daily. 02/01/23  Yes [provider]  tamsulosin  (FLOMAX ) 0.4 MG CAPS capsule  TAKE 1 CAPSULE(0.4 MG) BY MOUTH DAILY AS NEEDED 03/23/24  Yes Levora Reyes SAUNDERS, MD  testosterone  cypionate (DEPOTESTOSTERONE CYPIONATE) 200 MG/ML injection Inject 100 mg into the muscle once a week. 07/07/22  Yes [provider]  Vitamin D , Ergocalciferol , (DRISDOL ) 1.25 MG (50000 UNIT) CAPS capsule TAKE ONE CAPSULE BY MOUTH EVERY 7 DAYS 01/03/24  Yes Levora Reyes SAUNDERS, MD  zolpidem  (AMBIEN ) 10 MG tablet TAKE 1 TABLET(10 MG) BY MOUTH AT BEDTIME 04/20/24  Yes Levora Reyes SAUNDERS, MD  acetaminophen  (TYLENOL ) 500 MG tablet Take 1,000 mg by mouth every 6 (six) hours as needed for mild pain (pain score 1-3). Patient not taking: Reported on 06/03/2024    [provider]  dicyclomine  (BENTYL ) 20 MG tablet Take 20 mg by mouth 3 (three) times daily before meals.    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)   Occupation: [employer]  Tobacco Use   Smoking status: Never   Smokeless tobacco: Never  Vaping Use    Vaping status: Never Used  Substance and Sexual Activity   Alcohol use: Yes    Alcohol/week: 3.0 standard drinks of alcohol    Types: 3 Standard drinks or equivalent per week    Comment: social   Drug use: No   Sexual activity: Yes  Other Topics Concern   Not on file  Social History Narrative   Not on file   Social Drivers of Health   Financial Resource Strain: Medium Risk (04/30/2024)   Overall Financial Resource Strain (CARDIA)    Difficulty of Paying Living Expenses: Somewhat hard  Food Insecurity: Unknown (06/03/2024)   Hunger Vital Sign    Worried About Running Out of Food in the Last Year: Patient declined    Ran Out of Food in the Last Year: Never true  Transportation Needs: No Transportation Needs (04/30/2024)   PRAPARE - Administrator, Civil Service (Medical): No    Lack of Transportation (Non-Medical): No  Physical Activity: Sufficiently Active (04/30/2024)   Exercise Vital Sign    Days of Exercise per Week: 3 days    Minutes of Exercise per Session: 90 min  Stress: Stress Concern Present (04/30/2024)   [employer] of Occupational Health - Occupational Stress Questionnaire    Feeling of Stress: To some extent  Social Connections: Moderately Integrated (04/30/2024)   Social Connection and Isolation Panel    Frequency of Communication with Friends and Family: More than three times a week    Frequency of Social Gatherings with Friends and Family: Once a week    Attends Religious Services: More than 4 times per year    Active Member of [employer] or Organizations: Yes    Attends Banker Meetings: More than 4 times per year    Marital Status: Never married  Intimate Partner Violence: Not At Risk (10/15/2023)   Humiliation, Afraid, Rape, and Kick questionnaire    Fear of Current or Ex-Partner: No    Emotionally Abused: No    Physically Abused: No    Sexually Abused: No    Review of Systems Per HPI.   Objective:   Vitals:   06/03/24  0832  BP: ROLLEN)  112/90  Pulse: 80  Temp: 98.1 F (36.7 C)  TempSrc: Oral  SpO2: 92%  Weight: 268 lb 3.2 oz (121.7 kg)  Height: 5' 11 (1.803 m)     Physical Exam Vitals reviewed.  Constitutional:      Appearance: He is well-developed.  HENT:     Head: Normocephalic and atraumatic.  Eyes:     Pupils: Pupils are equal, round, and reactive to light.  Neck:     Vascular: No carotid bruit or JVD.  Cardiovascular:     Rate and Rhythm: Normal rate and regular rhythm.     Heart sounds: Normal heart sounds. No murmur heard. Pulmonary:     Effort: Pulmonary effort is normal.     Breath sounds: Normal breath sounds. No rales.  Skin:    General: Skin is warm and dry.  Neurological:     Mental Status: He is alert and oriented to person, place, and time.     Assessment & Plan:  Randall Reed. is a 53 y.o. male . Iron deficiency - Plan: Iron, TIBC and Ferritin Panel, CBC  B12 deficiency - Plan: B12, CBC  Fatigue, unspecified type - Plan: CBC  Elevated hemoglobin A1c Tolerating B12 and iron supplementation, check updated labs and adjust plan accordingly.  Check updated CBC. Continue follow-up with gastroenterology and general surgery as planned.  He also plans to contact his cardiologist regarding the increasing diastolic blood pressures, suspect low-dose ARB may be added based on last pharmacy visit.  Fatigue has improved since stopping Entresto . Handout given on prediabetes but no new meds recommended at this time.  I would avoid metformin at this time given gastrointestinal issues as above.  Will recheck labs in 3 months. RTC precautions given.    No orders of the defined types were placed in this encounter.  Patient Instructions  No change in meds from me today.  See information below on prediabetes, we can recheck that level in 3 months.  I am not concerned with the slight increase.  Continue B12 and iron same doses for now if any concerns on labs I will let you  know. Hang in there!    Prediabetes: Eating Plan Prediabetes is when your levels of blood sugar, also called glucose, are higher than normal. This can put you at risk for getting type 2 diabetes. When you have prediabetes, making healthy changes can help keep you from getting diabetes. This includes changes in your diet. Work with your health care provider or an expert in healthy eating called a dietitian. They can help you create a healthy eating plan. This plan can help you: Control your blood sugar levels. Improve your cholesterol levels. Manage your blood pressure. What are tips for following this plan? Reading food labels Read food labels to check the amount of fat and sugar in prepackaged foods. Avoid foods that have: Saturated fats. Trans fats. Added sugars. Check food labels for the amount of salt (sodium). Avoid foods that have more than 300 milligrams (mg) of salt per serving. Limit your salt intake to less than 2,300 mg each day. Shopping Avoid buying pre-made and processed foods. Avoid buying drinks with added sugar. Cooking Cook with olive oil. Do not use: Butter. Lard. Ghee. Bake, broil, grill, steam, or boil foods. Avoid frying. Meal planning  Work with your dietitian to create an eating plan that's right for you. This may include tracking how many calories you take in each day. Use a food diary, notebook, or mobile  app to track what you eat at each meal. Consider following a Mediterranean diet. This includes: Eating many servings of fresh fruits and vegetables each day. Eating fish at least twice a week. Eating one serving each day of whole grains, beans, nuts, and seeds. Using olive oil instead of other fats. Limiting alcohol. Limiting red meat. Using nonfat or low-fat dairy products. Consider following a plant-based diet. This means eating mostly: Vegetables and fruit. Grains. Beans. Nuts and seeds. If you have high blood pressure, you may need to limit  your salt intake or follow a diet called the DASH eating plan. The DASH eating plan can help lower high blood pressure. Lifestyle Set weight loss goals with help from your health care team. Losing 7% of your body weight is a good goal for most people with prediabetes. Exercise for at least 30 minutes, 5 or more days a week. For support, think about joining a support group or talking with a mental health counselor. Take medicines only as told. What foods are recommended? Fruits Berries. Bananas. Apples. Oranges. Grapes. Papaya. Mango. Pomegranate. Kiwi. Grapefruit. Cherries. Vegetables Lettuce. Spinach. Peas. Beets. Cauliflower. Cabbage. Broccoli. Carrots. Tomatoes. Squash. Eggplant. Herbs. Peppers. Onions. Cucumbers. Brussels sprouts. Grains Whole grains, such as whole-wheat or whole-grain breads or pasta. Unsweetened oatmeal. Bulgur. Barley. Quinoa. Brown rice. Corn or whole-wheat flour tortillas or taco shells. Meats and other proteins Seafood. Poultry without skin. Lean cuts of pork and beef. Tofu. Eggs. Nuts. Beans. Dairy Low-fat or fat-free dairy products, such as yogurt, cottage cheese, and cheese. Beverages Water. Tea. Coffee. Sugar-free or diet soda. Seltzer water. Low-fat or nonfat milk. Milk alternatives, such as soy or almond milk. Fats and oils Olive oil. Canola oil. Sunflower oil. Grapeseed oil. Avocado. Walnuts. Sweets and desserts Sugar-free or low-fat pudding. Sugar-free or low-fat ice cream and other frozen treats. Seasonings and condiments Herbs. Salt-free spices. Mustard. Relish. Low-salt, low-sugar ketchup. Low-salt, low-sugar barbecue sauce. Low-fat or fat-free mayonnaise. The items listed above may not be all the foods and drinks you can have. Talk with a dietitian to learn more. What foods are not recommended? Fruits Fruits canned with syrup. Vegetables Canned vegetables. Frozen vegetables with butter or cream sauce. Grains Refined white flour and flour  products, such as bread, pasta, snack foods, and cereals. Meats and other proteins Fatty cuts of meat. Poultry with skin. Breaded or fried meat. Processed meats. Dairy Full-fat yogurt, cheese, or milk. Beverages Sweetened drinks, such as iced tea and soda. Fats and oils Butter. Lard. Ghee. Sweets and desserts Baked goods, such as cake, cupcakes, pastries, cookies, and cheesecake. Seasonings and condiments Spice mixes with added salt. Ketchup. Barbecue sauce. Mayonnaise. The items listed above may not be all the foods and drinks you should avoid. Talk with a dietitian to learn more. Where to find more information American Diabetes Association: diabetes.org/food-nutrition This information is not intended to replace advice given to you by your health care provider. Make sure you discuss any questions you have with your health care provider. Document Revised: 05/26/2023 Document Reviewed: 05/26/2023 Elsevier Patient Education  2024 Elsevier Inc.    Signed,   Reyes Pines, MD [facility], [facility] 06/03/24 9:31 AM

## 2024-06-03 NOTE — Patient Instructions (Addendum)
 No change in meds from me today.  See information below on prediabetes, we can recheck that level in 3 months.  I am not concerned with the slight increase.  Continue B12 and iron same doses for now if any concerns on labs I will let you know. Hang in there!    Prediabetes: Eating Plan Prediabetes is when your levels of blood sugar, also called glucose, are higher than normal. This can put you at risk for getting type 2 diabetes. When you have prediabetes, making healthy changes can help keep you from getting diabetes. This includes changes in your diet. Work with your health care provider or an expert in healthy eating called a dietitian. They can help you create a healthy eating plan. This plan can help you: Control your blood sugar levels. Improve your cholesterol levels. Manage your blood pressure. What are tips for following this plan? Reading food labels Read food labels to check the amount of fat and sugar in prepackaged foods. Avoid foods that have: Saturated fats. Trans fats. Added sugars. Check food labels for the amount of salt (sodium). Avoid foods that have more than 300 milligrams (mg) of salt per serving. Limit your salt intake to less than 2,300 mg each day. Shopping Avoid buying pre-made and processed foods. Avoid buying drinks with added sugar. Cooking Cook with olive oil. Do not use: Butter. Lard. Ghee. Bake, broil, grill, steam, or boil foods. Avoid frying. Meal planning  Work with your dietitian to create an eating plan that's right for you. This may include tracking how many calories you take in each day. Use a food diary, notebook, or mobile app to track what you eat at each meal. Consider following a Mediterranean diet. This includes: Eating many servings of fresh fruits and vegetables each day. Eating fish at least twice a week. Eating one serving each day of whole grains, beans, nuts, and seeds. Using olive oil instead of other fats. Limiting  alcohol. Limiting red meat. Using nonfat or low-fat dairy products. Consider following a plant-based diet. This means eating mostly: Vegetables and fruit. Grains. Beans. Nuts and seeds. If you have high blood pressure, you may need to limit your salt intake or follow a diet called the DASH eating plan. The DASH eating plan can help lower high blood pressure. Lifestyle Set weight loss goals with help from your health care team. Losing 7% of your body weight is a good goal for most people with prediabetes. Exercise for at least 30 minutes, 5 or more days a week. For support, think about joining a support group or talking with a mental health counselor. Take medicines only as told. What foods are recommended? Fruits Berries. Bananas. Apples. Oranges. Grapes. Papaya. Mango. Pomegranate. Kiwi. Grapefruit. Cherries. Vegetables Lettuce. Spinach. Peas. Beets. Cauliflower. Cabbage. Broccoli. Carrots. Tomatoes. Squash. Eggplant. Herbs. Peppers. Onions. Cucumbers. Brussels sprouts. Grains Whole grains, such as whole-wheat or whole-grain breads or pasta. Unsweetened oatmeal. Bulgur. Barley. Quinoa. Brown rice. Corn or whole-wheat flour tortillas or taco shells. Meats and other proteins Seafood. Poultry without skin. Lean cuts of pork and beef. Tofu. Eggs. Nuts. Beans. Dairy Low-fat or fat-free dairy products, such as yogurt, cottage cheese, and cheese. Beverages Water. Tea. Coffee. Sugar-free or diet soda. Seltzer water. Low-fat or nonfat milk. Milk alternatives, such as soy or almond milk. Fats and oils Olive oil. Canola oil. Sunflower oil. Grapeseed oil. Avocado. Walnuts. Sweets and desserts Sugar-free or low-fat pudding. Sugar-free or low-fat ice cream and other frozen treats. Seasonings and condiments Herbs.  Salt-free spices. Mustard. Relish. Low-salt, low-sugar ketchup. Low-salt, low-sugar barbecue sauce. Low-fat or fat-free mayonnaise. The items listed above may not be all the foods and  drinks you can have. Talk with a dietitian to learn more. What foods are not recommended? Fruits Fruits canned with syrup. Vegetables Canned vegetables. Frozen vegetables with butter or cream sauce. Grains Refined white flour and flour products, such as bread, pasta, snack foods, and cereals. Meats and other proteins Fatty cuts of meat. Poultry with skin. Breaded or fried meat. Processed meats. Dairy Full-fat yogurt, cheese, or milk. Beverages Sweetened drinks, such as iced tea and soda. Fats and oils Butter. Lard. Ghee. Sweets and desserts Baked goods, such as cake, cupcakes, pastries, cookies, and cheesecake. Seasonings and condiments Spice mixes with added salt. Ketchup. Barbecue sauce. Mayonnaise. The items listed above may not be all the foods and drinks you should avoid. Talk with a dietitian to learn more. Where to find more information American Diabetes Association: diabetes.org/food-nutrition This information is not intended to replace advice given to you by your health care provider. Make sure you discuss any questions you have with your health care provider. Document Revised: 05/26/2023 Document Reviewed: 05/26/2023 Elsevier Patient Education  2024 [employer].

## 2024-06-04 ENCOUNTER — Ambulatory Visit (INDEPENDENT_AMBULATORY_CARE_PROVIDER_SITE_OTHER): Admitting: Otolaryngology

## 2024-06-04 LAB — IRON,TIBC AND FERRITIN PANEL
%SAT: 13 % — ABNORMAL LOW (ref 20–48)
Ferritin: 9 ng/mL — ABNORMAL LOW (ref 38–380)
Iron: 53 ug/dL (ref 50–180)
TIBC: 422 ug/dL (ref 250–425)

## 2024-06-05 ENCOUNTER — Ambulatory Visit: Payer: Self-pay | Admitting: Family Medicine

## 2024-06-09 ENCOUNTER — Encounter: Payer: Self-pay | Admitting: Cardiovascular Disease

## 2024-06-09 DIAGNOSIS — K429 Umbilical hernia without obstruction or gangrene: Secondary | ICD-10-CM | POA: Diagnosis not present

## 2024-06-09 DIAGNOSIS — K5732 Diverticulitis of large intestine without perforation or abscess without bleeding: Secondary | ICD-10-CM | POA: Diagnosis not present

## 2024-06-09 DIAGNOSIS — D171 Benign lipomatous neoplasm of skin and subcutaneous tissue of trunk: Secondary | ICD-10-CM | POA: Diagnosis not present

## 2024-06-09 DIAGNOSIS — Z9049 Acquired absence of other specified parts of digestive tract: Secondary | ICD-10-CM | POA: Diagnosis not present

## 2024-06-11 DIAGNOSIS — J301 Allergic rhinitis due to pollen: Secondary | ICD-10-CM | POA: Diagnosis not present

## 2024-06-11 DIAGNOSIS — J3089 Other allergic rhinitis: Secondary | ICD-10-CM | POA: Diagnosis not present

## 2024-06-15 MED ORDER — VALSARTAN 80 MG PO TABS: 80.0000 mg | ORAL_TABLET | Freq: Every day | ORAL | 0 refills | Status: DC

## 2024-06-20 ENCOUNTER — Other Ambulatory Visit: Payer: Self-pay | Admitting: Family Medicine

## 2024-06-20 DIAGNOSIS — R339 Retention of urine, unspecified: Secondary | ICD-10-CM

## 2024-06-22 ENCOUNTER — Other Ambulatory Visit: Payer: Self-pay | Admitting: Family Medicine

## 2024-06-22 DIAGNOSIS — F411 Generalized anxiety disorder: Secondary | ICD-10-CM

## 2024-06-22 DIAGNOSIS — M6281 Muscle weakness (generalized): Secondary | ICD-10-CM | POA: Diagnosis not present

## 2024-06-22 DIAGNOSIS — N50819 Testicular pain, unspecified: Secondary | ICD-10-CM | POA: Diagnosis not present

## 2024-06-22 DIAGNOSIS — M6289 Other specified disorders of muscle: Secondary | ICD-10-CM | POA: Diagnosis not present

## 2024-06-22 DIAGNOSIS — M62838 Other muscle spasm: Secondary | ICD-10-CM | POA: Diagnosis not present

## 2024-06-23 NOTE — Telephone Encounter (Signed)
 Controlled substance database reviewed.  Alprazolam  No. 60 last filled on 05/29/2024.  Previously 04/30/2024.  Will pend refill to fill when due.  Medication has been discussed recently at a visit in June.

## 2024-06-23 NOTE — Telephone Encounter (Signed)
 Requested Prescriptions   Pending Prescriptions Disp Refills   ALPRAZolam  (XANAX ) 1 MG tablet [Pharmacy Med Name: ALPRAZOLAM  1MG  TABLETS] 60 tablet     Sig: TAKE 1 TABLET BY MOUTH TWICE DAILY AS NEEDED     Date of patient request: 06/23/24 Last office visit: 06/03/2024 Upcoming visit: 09/03/2024 Date of last refill: 05/25/24 Last refill amount: 60 tablets for 30 days.   Patient is requesting early. Pharmacy will put on hold until ready to fill.

## 2024-06-25 DIAGNOSIS — J301 Allergic rhinitis due to pollen: Secondary | ICD-10-CM | POA: Diagnosis not present

## 2024-06-25 DIAGNOSIS — J3089 Other allergic rhinitis: Secondary | ICD-10-CM | POA: Diagnosis not present

## 2024-06-26 ENCOUNTER — Other Ambulatory Visit: Payer: Self-pay | Admitting: Family Medicine

## 2024-06-26 DIAGNOSIS — G43109 Migraine with aura, not intractable, without status migrainosus: Secondary | ICD-10-CM

## 2024-06-26 NOTE — Telephone Encounter (Signed)
 Requested Prescriptions   Pending Prescriptions Disp Refills   rizatriptan  (MAXALT -MLT) 10 MG disintegrating tablet [Pharmacy Med Name: Rizatriptan  Benzoate 10 MG Oral Tablet Disintegrating] 9 tablet 0    Sig: DISSOLVE 1 TABLET IN MOUTH AS NEEDED FOR  HEADACHE  -  MAY  REPEAT  IN  2  HOURS  AS  NEEDED     Date of patient request: 06/26/2024 Last office visit: 06/03/2024 Upcoming visit: 09/03/2024 Date of last refill: 06/01/2024 Last refill amount: 9

## 2024-07-01 ENCOUNTER — Other Ambulatory Visit: Payer: Self-pay | Admitting: Cardiovascular Disease

## 2024-07-01 DIAGNOSIS — I10 Essential (primary) hypertension: Secondary | ICD-10-CM

## 2024-07-01 DIAGNOSIS — E782 Mixed hyperlipidemia: Secondary | ICD-10-CM

## 2024-07-01 DIAGNOSIS — I25118 Atherosclerotic heart disease of native coronary artery with other forms of angina pectoris: Secondary | ICD-10-CM

## 2024-07-03 ENCOUNTER — Other Ambulatory Visit: Payer: Self-pay | Admitting: Cardiovascular Disease

## 2024-07-03 ENCOUNTER — Encounter: Payer: Self-pay | Admitting: Gastroenterology

## 2024-07-03 DIAGNOSIS — I25118 Atherosclerotic heart disease of native coronary artery with other forms of angina pectoris: Secondary | ICD-10-CM

## 2024-07-03 DIAGNOSIS — E782 Mixed hyperlipidemia: Secondary | ICD-10-CM

## 2024-07-03 DIAGNOSIS — I10 Essential (primary) hypertension: Secondary | ICD-10-CM

## 2024-07-06 ENCOUNTER — Encounter: Payer: Self-pay | Admitting: Gastroenterology

## 2024-07-08 DIAGNOSIS — J301 Allergic rhinitis due to pollen: Secondary | ICD-10-CM | POA: Diagnosis not present

## 2024-07-08 DIAGNOSIS — J3089 Other allergic rhinitis: Secondary | ICD-10-CM | POA: Diagnosis not present

## 2024-07-10 ENCOUNTER — Encounter: Payer: Self-pay | Admitting: Gastroenterology

## 2024-07-10 ENCOUNTER — Ambulatory Visit: Admitting: Gastroenterology

## 2024-07-10 VITALS — BP 134/84 | HR 91 | Temp 98.4°F | Resp 18 | Ht 70.0 in | Wt 268.0 lb

## 2024-07-10 DIAGNOSIS — D123 Benign neoplasm of transverse colon: Secondary | ICD-10-CM | POA: Diagnosis not present

## 2024-07-10 DIAGNOSIS — K317 Polyp of stomach and duodenum: Secondary | ICD-10-CM | POA: Diagnosis not present

## 2024-07-10 DIAGNOSIS — D509 Iron deficiency anemia, unspecified: Secondary | ICD-10-CM | POA: Diagnosis not present

## 2024-07-10 DIAGNOSIS — D122 Benign neoplasm of ascending colon: Secondary | ICD-10-CM

## 2024-07-10 DIAGNOSIS — K648 Other hemorrhoids: Secondary | ICD-10-CM

## 2024-07-10 DIAGNOSIS — K5730 Diverticulosis of large intestine without perforation or abscess without bleeding: Secondary | ICD-10-CM

## 2024-07-10 DIAGNOSIS — K219 Gastro-esophageal reflux disease without esophagitis: Secondary | ICD-10-CM | POA: Diagnosis not present

## 2024-07-10 DIAGNOSIS — R1032 Left lower quadrant pain: Secondary | ICD-10-CM

## 2024-07-10 DIAGNOSIS — K644 Residual hemorrhoidal skin tags: Secondary | ICD-10-CM | POA: Diagnosis not present

## 2024-07-10 DIAGNOSIS — Z980 Intestinal bypass and anastomosis status: Secondary | ICD-10-CM

## 2024-07-10 DIAGNOSIS — K3189 Other diseases of stomach and duodenum: Secondary | ICD-10-CM | POA: Diagnosis not present

## 2024-07-10 MED ORDER — SODIUM CHLORIDE 0.9 % IV SOLN: 500.0000 mL | Freq: Once | INTRAVENOUS | Status: DC

## 2024-07-10 NOTE — Progress Notes (Signed)
1350 Report given to PACU, vss

## 2024-07-10 NOTE — Op Note (Signed)
 [facility] Patient Name: Randall Reed Procedure Date: 07/10/2024 1:01 PM MRN: [id number] Endoscopist: Elspeth P. Leigh , MD, [id number] Age: 53 Referring MD:  Date of Birth: [birth year] Gender: Male Account #: [id number] Procedure:                Upper GI endoscopy Indications:              Iron deficiency and B12 deficiency, historyof                            gastro-esophageal reflux disease (on BOTH protonix                             and nexium  to maintain control of symptoms,                            endorses recent AM nausea / vomiting Medicines:                Monitored Anesthesia Care Procedure:                Pre-Anesthesia Assessment:                           - Prior to the procedure, a History and Physical                            was performed, and patient medications and                            allergies were reviewed. The patient's tolerance of                            previous anesthesia was also reviewed. The risks                            and benefits of the procedure and the sedation                            options and risks were discussed with the patient.                            All questions were answered, and informed consent                            was obtained. Prior Anticoagulants: The patient has                            taken no anticoagulant or antiplatelet agents. ASA                            Grade Assessment: III - A patient with severe                            systemic disease. After reviewing the risks and  benefits, the patient was deemed in satisfactory                            condition to undergo the procedure.                           After obtaining informed consent, the endoscope was                            passed under direct vision. Throughout the                            procedure, the patient's blood pressure, pulse, and                            oxygen saturations  were monitored continuously. The                            Olympus Scope [id number] was introduced through the                            mouth, and advanced to the second part of duodenum.                            The upper GI endoscopy was accomplished without                            difficulty. The patient tolerated the procedure                            well. Scope In: Scope Out: Findings:                 Esophagogastric landmarks were identified: the                            Z-line was found at 40 cm, the gastroesophageal                            junction was found at 40 cm and the upper extent of                            the gastric folds was found at 40 cm from the                            incisors.                           The exam of the esophagus was otherwise normal.                           A single 3 mm sessile polyp was found in the                            gastric  fundus. The polyp was removed with a cold                            biopsy forceps. Resection and retrieval were                            complete.                           The exam of the stomach was otherwise normal.                           Biopsies were taken with a cold forceps for                            Helicobacter pylori testing.                           The examined duodenum was normal. Biopsies for                            histology were taken with a cold forceps for                            evaluation of celiac disease. Complications:            No immediate complications. Estimated blood loss:                            Minimal. Estimated Blood Loss:     Estimated blood loss was minimal. Impression:               - Esophagogastric landmarks identified.                           - Normal esophagus otherwise - no erosive changes.                           - A single gastric polyp. Resected and retrieved.                           - Normal stomach otherwise. Biopsies taken  to rule                            out H pylori                           - Normal examined duodenum. Biopsied.                           No overt cause for iron / B12 deficiency, will                            await biopsy results. Otherwise no Barrett's or  erosive changes, but requiring high dose PPI of 2                            PPIs to control symptoms, would recommend trial of                            Voquezna. Recommendation:           - Patient has a contact number available for                            emergencies. The signs and symptoms of potential                            delayed complications were discussed with the                            patient. Return to normal activities tomorrow.                            Written discharge instructions were provided to the                            patient.                           - Resume previous diet.                           - Continue present medications.                           - Trial of Voquezna 10mg  / day for nonerosive                            reflux. Can give 2 week sample from the office and                            if this works, can prescribe                           - Recommend Zofran  4mg  ODT every 8 hours PRN                            nausea, can prescribe                           - Await pathology results.                           - Monitor Iron / B12 levels with repletion.                            Consideration for capsule endoscopy if iron  deficiency persists. Recent CT reassuring Elspeth SQUIBB. Faheem Ziemann, MD 07/10/2024 2:03:43 PM This report has been signed electronically.

## 2024-07-10 NOTE — Op Note (Signed)
 [facility] Patient Name: Randall Reed Procedure Date: 07/10/2024 1:01 PM MRN: [id number] Endoscopist: Elspeth P. 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 The patient's tolerance of                            previous anesthesia was also reviewed. The risks                            and benefits of the procedure and the sedation                            options and risks were discussed with the patient.                            All questions were answered, and informed consent                            was obtained. Prior Anticoagulants: The patient has                            taken no anticoagulant or antiplatelet agents. ASA                            Grade Assessment: III - A patient with severe                            systemic disease. After reviewing the risks and                            benefits, the patient was deemed in satisfactory                            condition to undergo the procedure.  After obtaining informed consent, the colonoscope                            was passed under direct vision. Throughout the                            procedure, the patient's blood pressure, pulse, and                            oxygen saturations were monitored continuously. The                            CF [id number] #[id number] was introduced through the anus                            and advanced to the  the terminal ileum, with                            identification of the appendiceal orifice and IC                            valve. The colonoscopy was performed without                            difficulty. The patient tolerated the procedure                            well. The quality of the bowel preparation was                            adequate. The terminal ileum, ileocecal valve,                            appendiceal orifice, and rectum were photographed. Scope In: 1:36:34 PM Scope Out: 1:53:25 PM Scope Withdrawal Time: 0 hours 15 minutes 0 seconds  Total Procedure Duration: 0 hours 16 minutes 51 seconds  Findings:                 Hemorrhoids were found on perianal exam.                           The terminal ileum appeared normal.                           Scattered diverticula were found in the entire                            colon.                           A 3 mm polyp was found in the ascending colon. The                            polyp was sessile. The polyp was removed with a  cold snare. Resection and retrieval were complete.                           A 3 mm polyp was found in the transverse colon. The                            polyp was sessile. The polyp was removed with a                            cold snare. Resection and retrieval were complete.                           There was evidence of a prior end-to-end                            colo-colonic anastomosis in the sigmoid colon. This                            was patent and was characterized by healthy                            appearing mucosa.                           Internal hemorrhoids were found during retroflexion.                           The exam was otherwise without abnormality. Complications:            No immediate complications. Estimated blood loss:                            Minimal. Estimated Blood Loss:     Estimated blood loss was minimal. Impression:                - Hemorrhoids found on perianal exam.                           - The examined portion of the ileum was normal.                           - Diverticulosis in the entire examined colon.                           - One 3 mm polyp in the ascending colon, removed                            with a cold snare. Resected and retrieved.                           - One 3 mm polyp in the transverse colon, removed                            with a cold snare. Resected and retrieved.                           -  Patent end-to-end colo-colonic anastomosis,                            characterized by healthy appearing mucosa.                           - Internal hemorrhoids.                           - The examination was otherwise normal.                           No cause for iron deficiency on colonoscopy. Recommendation:           - Patient has a contact number available for                            emergencies. The signs and symptoms of potential                            delayed complications were discussed with the                            patient. Return to normal activities tomorrow.                            Written discharge instructions were provided to the                            patient.                           - Resume previous diet.                           - Continue present medications.                           - Await pathology results. Elspeth P. Dohn Stclair, MD 07/10/2024 1:58:58 PM This report has been signed electronically.

## 2024-07-10 NOTE — Progress Notes (Signed)
 [facility] History and Physical   Primary Care Physician:  Levora Reyes SAUNDERS, MD   Reason for Procedure:   Iron deficiency anemia, B12 deficiency  Plan:    EGD and colonoscopy     HPI: Randall Reed. is a 53 y.o. male  here for EGD and colonoscopy - noted to have both iron deficiency and B12 deficiency. History of GERD on PPI chronically - takes both protonix  and nexium  to control symptoms. History of sigmoid resection for stenosis in 10/2023 related to diverticulosis. Last EGD and colonoscopy in 07/2022.  HE has had some nausea / vomiting, and altered bowel habits and LUQ pain.  No family history of colon cancer known. Otherwise feels well without any cardiopulmonary symptoms.   I have discussed risks / benefits of anesthesia and endoscopic procedure with Randall Reed. and they wish to proceed with the exams as outlined today.    Past Medical History:  Diagnosis Date   Allergy    Anemia    Anxiety    Asthma    CAD (coronary artery disease) 03/04/2024   CHF (congestive heart failure) (HCC) 03/04/2024   EF 50%   Complication of anesthesia    oxygen levelds dropped  with colonoscopy   Depression    Dyspnea    sometimes with asthma has sob   GERD (gastroesophageal reflux disease)    Headache    Hyperlipidemia    Phreesia 12/05/2020   Hypertension    Pneumonia    Seasonal allergic reaction    Sleep apnea     Past Surgical History:  Procedure Laterality Date   CIRCUMCISION  10/2022   COLONOSCOPY  07/17/2022   FLEXIBLE SIGMOIDOSCOPY N/A 10/11/2023   Procedure: FLEXIBLE SIGMOIDOSCOPY;  Surgeon: Teresa Lonni HERO, MD;  Location: [facility];  Service: General;  Laterality: N/A;   LIPOMA EXCISION N/A 10/30/2018   Procedure: EXCISION OF MULTIPLE SUBCUTANEOUS LIPOMAS ON TORSO;  Surgeon: Belinda Cough, MD;  Location: [facility];  Service: General;  Laterality: N/A;   UPPER GASTROINTESTINAL ENDOSCOPY  07/17/2022   XI ROBOTIC ASSISTED  LOWER ANTERIOR RESECTION N/A 10/11/2023   Procedure: XI ROBOTIC ASSISTED LOWER ANTERIOR RESECTION, SIGMOIDECTOMY WITH INTRAOPERATIVE ASSESSMENT OF PERFUSION ICG, BILATERAL TAP BLOCK;  Surgeon: Teresa Lonni HERO, MD;  Location: [facility];  Service: General;  Laterality: N/A;  180    Prior to Admission medications   Medication Sig Start Date End Date Taking? Authorizing Provider  AIRSUPRA  90-80 MCG/ACT AERO Inhale 2 puffs into the lungs every 4 (four) hours as needed. 03/30/23  Yes [provider]  albuterol  (PROVENTIL ) (2.5 MG/3ML) 0.083% nebulizer solution  02/27/24  Yes [provider]  albuterol  (VENTOLIN  HFA) 108 (90 Base) MCG/ACT inhaler INHALE 2 PUFFS INTO THE LUNGS EVERY 6 HOURS AS NEEDED FOR WHEEZING OR SHORTNESS OF BREATH 07/19/20  Yes Levora Reyes SAUNDERS, MD  [facility] ALLERGY 180 MG tablet Take 180 mg by mouth daily. 07/12/20  Yes [provider]  ALPRAZolam  (XANAX ) 1 MG tablet TAKE 1 TABLET BY MOUTH TWICE DAILY AS NEEDED 06/23/24  Yes Levora Reyes SAUNDERS, MD  anastrozole (ARIMIDEX) 1 MG tablet Take 0.5 mg by mouth once a week.   Yes [provider]  aspirin  EC 81 MG tablet Take 1 tablet (81 mg total) by mouth daily. 03/07/15  Yes Wonda Sharper, MD  Budeson-Glycopyrrol-Formoterol  (BREZTRI AEROSPHERE) 160-9-4.8 MCG/ACT AERO Inhale 2 puffs into the lungs in the morning and at bedtime. 09/14/21  Yes [provider]  budesonide  (PULMICORT )  0.5 MG/2ML nebulizer solution 2 (two) times daily. 06/29/24  Yes [provider]  clotrimazole  (MYCELEX ) 10 MG troche SMARTSIG:1 Lozenge(s) By Mouth 5 Times Daily 02/27/24  Yes [provider]  cyanocobalamin  (VITAMIN B12) 1000 MCG tablet Take 1 tablet (1,000 mcg total) by mouth daily. 05/04/24  Yes Levora Reyes SAUNDERS, MD  dicyclomine  (BENTYL ) 20 MG tablet Take 1 tablet (20 mg total) by mouth in the morning and at bedtime. 05/25/24  Yes [place], PA-C  doxycycline  (VIBRA -TABS) 100 MG tablet Take 200 mg by  mouth daily as needed. 06/25/24  Yes [provider]  DUPIXENT 300 MG/2ML prefilled syringe Inject 300 mg into the skin every 14 (fourteen) days. 10/06/21  Yes [provider]  emtricitabine -tenofovir  (TRUVADA ) 200-300 MG tablet TAKE 1 TABLET BY MOUTH DAILY 04/30/24  Yes Levora Reyes SAUNDERS, MD  esomeprazole  (NEXIUM ) 40 MG capsule Take 40 mg by mouth in the morning. 11/07/12  Yes [provider]  furosemide  (LASIX ) 20 MG tablet Take 1 tablet (20 mg total) by mouth daily. As needed for edema 02/03/24  Yes Wonda Sharper, MD  ipratropium (ATROVENT ) 0.06 % nasal spray Place 2 sprays into the nose 2 (two) times daily. Patient taking differently: Place 2 sprays into the nose daily as needed for rhinitis. 05/25/21  Yes Levora Reyes SAUNDERS, MD  metoprolol  succinate (TOPROL -XL) 50 MG 24 hr tablet Take 1 tablet (50 mg total) by mouth daily. Take with or immediately following a meal. 04/02/24  Yes Wonda Sharper, MD  mirabegron ER (MYRBETRIQ) 25 MG TB24 tablet Take 25 mg by mouth daily as needed (bladder spasm).   Yes [provider]  montelukast  (SINGULAIR ) 10 MG tablet TAKE 1 TABLET(10 MG) BY MOUTH AT BEDTIME 03/23/24  Yes Levora Reyes SAUNDERS, MD  olopatadine  (PATADAY ) 0.1 % ophthalmic solution Place 1 drop into both eyes 2 (two) times daily as needed for allergies. 01/17/24  Yes Jerrell Cleatus Ned, MD  Olopatadine -Mometasone  JODELLE) [phone] MCG/ACT SUSP Place 1 spray into the nose in the morning and at bedtime.   Yes [provider]  ondansetron  (ZOFRAN -ODT) 4 MG disintegrating tablet Take 1 tablet (4 mg total) by mouth every 8 (eight) hours as needed for nausea or vomiting. 05/25/24  Yes [place], PA-C  pantoprazole  (PROTONIX ) 40 MG tablet Take 1 tablet (40 mg total) by mouth daily. 05/01/24  Yes Levora Reyes SAUNDERS, MD  Potassium Chloride  ER 20 MEQ TBCR TAKE 1 TABLET(20 MEQ) BY MOUTH DAILY 07/01/24  Yes Wonda Sharper, MD  rizatriptan  (MAXALT -MLT) 10 MG disintegrating  tablet DISSOLVE 1 TABLET IN MOUTH AS NEEDED FOR  HEADACHE  -  MAY  REPEAT  IN  2  HOURS  AS  NEEDED 06/26/24  Yes Levora Reyes SAUNDERS, MD  rosuvastatin  (CRESTOR ) 10 MG tablet TAKE 1 TABLET(10 MG) BY MOUTH DAILY 07/03/24  Yes Wonda Sharper, MD  sildenafil  (VIAGRA ) 100 MG tablet Take 1/2-1 tablet by mouth once daily as needed prior to sexual activity *MUST KEEP UPCOMING APPT* 04/02/23  Yes Wonda Sharper, MD  Spacer/Aero-Holding Chambers DEVI Use with inhaler 05/15/22  Yes Icard, Adine CROME, DO  tadalafil  (CIALIS ) 5 MG tablet Take 5 mg by mouth daily. 02/01/23  Yes [provider]  tamsulosin  (FLOMAX ) 0.4 MG CAPS capsule TAKE 1 CAPSULE(0.4 MG) BY MOUTH DAILY AS NEEDED 06/22/24  Yes Levora Reyes SAUNDERS, MD  valsartan  (DIOVAN ) 80 MG tablet Take 1 tablet (80 mg total) by mouth daily. 06/15/24  Yes Wonda Sharper, MD  Vitamin D , Ergocalciferol , (DRISDOL ) 1.25 MG (  50000 UNIT) CAPS capsule TAKE ONE CAPSULE BY MOUTH EVERY 7 DAYS 01/03/24  Yes Levora Reyes SAUNDERS, MD  zolpidem  (AMBIEN ) 10 MG tablet TAKE 1 TABLET(10 MG) BY MOUTH AT BEDTIME 04/20/24  Yes Levora Reyes SAUNDERS, MD  celecoxib (CELEBREX) 200 MG capsule Take 200 mg by mouth 2 (two) times daily as needed for moderate pain (pain score 4-6). PRN 10/23/22   [provider]  famotidine (PEPCID) 40 MG tablet Take 40 mg by mouth daily as needed for heartburn or indigestion. 02/18/20   [provider]  ferrous sulfate  (FEROSUL) 325 (65 FE) MG tablet Take 1 tablet (325 mg total) by mouth daily with breakfast. 05/04/24   Levora Reyes SAUNDERS, MD  ipratropium-albuterol  (DUONEB) 0.5-2.5 (3) MG/3ML SOLN Take 3 mLs by nebulization every 6 (six) hours as needed (Shortness of breath or wheezing). 12/25/21   [provider]  pramoxine-hydrocortisone  (PROCTOCREAM-HC) 1-1 % rectal cream Place rectally 3 (three) times daily. 05/07/22   Levora Reyes SAUNDERS, MD  testosterone  cypionate (DEPOTESTOSTERONE CYPIONATE) 200 MG/ML injection Inject 100 mg into the muscle  once a week. 07/07/22   [provider]  dicyclomine  (BENTYL ) 20 MG tablet Take 20 mg by mouth 3 (three) times daily before meals.    [provider]    Current Outpatient Medications  Medication Sig Dispense Refill   AIRSUPRA  90-80 MCG/ACT AERO Inhale 2 puffs into the lungs every 4 (four) hours as needed.     albuterol  (PROVENTIL ) (2.5 MG/3ML) 0.083% nebulizer solution      albuterol  (VENTOLIN  HFA) 108 (90 Base) MCG/ACT inhaler INHALE 2 PUFFS INTO THE LUNGS EVERY 6 HOURS AS NEEDED FOR WHEEZING OR SHORTNESS OF BREATH 8.5 g 0   ALLEGRA ALLERGY 180 MG tablet Take 180 mg by mouth daily.     ALPRAZolam  (XANAX ) 1 MG tablet TAKE 1 TABLET BY MOUTH TWICE DAILY AS NEEDED 60 tablet 0   anastrozole (ARIMIDEX) 1 MG tablet Take 0.5 mg by mouth once a week.     aspirin  EC 81 MG tablet Take 1 tablet (81 mg total) by mouth daily.     Budeson-Glycopyrrol-Formoterol  (BREZTRI AEROSPHERE) 160-9-4.8 MCG/ACT AERO Inhale 2 puffs into the lungs in the morning and at bedtime.     budesonide  (PULMICORT ) 0.5 MG/2ML nebulizer solution 2 (two) times daily.     clotrimazole  (MYCELEX ) 10 MG troche SMARTSIG:1 Lozenge(s) By Mouth 5 Times Daily     cyanocobalamin  (VITAMIN B12) 1000 MCG tablet Take 1 tablet (1,000 mcg total) by mouth daily. 30 tablet 3   dicyclomine  (BENTYL ) 20 MG tablet Take 1 tablet (20 mg total) by mouth in the morning and at bedtime. 60 tablet 2   doxycycline  (VIBRA -TABS) 100 MG tablet Take 200 mg by mouth daily as needed.     DUPIXENT 300 MG/2ML prefilled syringe Inject 300 mg into the skin every 14 (fourteen) days.     emtricitabine -tenofovir  (TRUVADA ) 200-300 MG tablet TAKE 1 TABLET BY MOUTH DAILY 90 tablet 1   esomeprazole  (NEXIUM ) 40 MG capsule Take 40 mg by mouth in the morning.     furosemide  (LASIX ) 20 MG tablet Take 1 tablet (20 mg total) by mouth daily. As needed for edema 90 tablet 3   ipratropium (ATROVENT ) 0.06 % nasal spray Place 2 sprays into the nose 2 (two) times daily.  (Patient taking differently: Place 2 sprays into the nose daily as needed for rhinitis.) 15 mL 11   metoprolol  succinate (TOPROL -XL) 50 MG 24 hr tablet Take 1 tablet (50 mg total) by  mouth daily. Take with or immediately following a meal. 90 tablet 3   mirabegron ER (MYRBETRIQ) 25 MG TB24 tablet Take 25 mg by mouth daily as needed (bladder spasm).     montelukast  (SINGULAIR ) 10 MG tablet TAKE 1 TABLET(10 MG) BY MOUTH AT BEDTIME 90 tablet 3   olopatadine  (PATADAY ) 0.1 % ophthalmic solution Place 1 drop into both eyes 2 (two) times daily as needed for allergies. 5 mL 12   Olopatadine -Mometasone  ([name]) 665-25 MCG/ACT SUSP Place 1 spray into the nose in the morning and at bedtime.     ondansetron  (ZOFRAN -ODT) 4 MG disintegrating tablet Take 1 tablet (4 mg total) by mouth every 8 (eight) hours as needed for nausea or vomiting. 30 tablet 3   pantoprazole  (PROTONIX ) 40 MG tablet Take 1 tablet (40 mg total) by mouth daily. 180 tablet 1   Potassium Chloride  ER 20 MEQ TBCR TAKE 1 TABLET(20 MEQ) BY MOUTH DAILY 90 tablet 3   rizatriptan  (MAXALT -MLT) 10 MG disintegrating tablet DISSOLVE 1 TABLET IN MOUTH AS NEEDED FOR  HEADACHE  -  MAY  REPEAT  IN  2  HOURS  AS  NEEDED 9 tablet 0   rosuvastatin  (CRESTOR ) 10 MG tablet TAKE 1 TABLET(10 MG) BY MOUTH DAILY 90 tablet 3   sildenafil  (VIAGRA ) 100 MG tablet Take 1/2-1 tablet by mouth once daily as needed prior to sexual activity *MUST KEEP UPCOMING APPT* 90 tablet 3   Spacer/Aero-Holding Chambers DEVI Use with inhaler 1 each 0   tadalafil  (CIALIS ) 5 MG tablet Take 5 mg by mouth daily.     tamsulosin  (FLOMAX ) 0.4 MG CAPS capsule TAKE 1 CAPSULE(0.4 MG) BY MOUTH DAILY AS NEEDED 90 capsule 0   valsartan  (DIOVAN ) 80 MG tablet Take 1 tablet (80 mg total) by mouth daily. 90 tablet 0   Vitamin D , Ergocalciferol , (DRISDOL ) 1.25 MG (50000 UNIT) CAPS capsule TAKE ONE CAPSULE BY MOUTH EVERY 7 DAYS 15 capsule 1   zolpidem  (AMBIEN ) 10 MG tablet TAKE 1 TABLET(10 MG) BY MOUTH AT  BEDTIME 90 tablet 0   celecoxib (CELEBREX) 200 MG capsule Take 200 mg by mouth 2 (two) times daily as needed for moderate pain (pain score 4-6). PRN     famotidine (PEPCID) 40 MG tablet Take 40 mg by mouth daily as needed for heartburn or indigestion.     ferrous sulfate  (FEROSUL) 325 (65 FE) MG tablet Take 1 tablet (325 mg total) by mouth daily with breakfast. 30 tablet 3   ipratropium-albuterol  (DUONEB) 0.5-2.5 (3) MG/3ML SOLN Take 3 mLs by nebulization every 6 (six) hours as needed (Shortness of breath or wheezing).     pramoxine-hydrocortisone  (PROCTOCREAM-HC) 1-1 % rectal cream Place rectally 3 (three) times daily. 30 g 1   testosterone  cypionate (DEPOTESTOSTERONE CYPIONATE) 200 MG/ML injection Inject 100 mg into the muscle once a week.     Current Facility-Administered Medications  Medication Dose Route Frequency Provider Last Rate Last Admin   0.9 %  sodium chloride  infusion  500 mL Intravenous Once Jeorge Reister, Elspeth SQUIBB, MD        Allergies as of 07/10/2024 - Review Complete 07/10/2024  Allergen Reaction Noted   Desloratadine Other (See Comments)    Loratadine Other (See Comments)    Ketek [telithromycin] Itching and Rash    Levbid  [hyoscyamine  sulfate] Rash 02/04/2012    Family History  Problem Relation Age of Onset   Coronary artery disease Father 50       stents, CABG   Heart attack Father  Heart attack Maternal Grandfather    Coronary artery disease Paternal Grandfather    Hypertension Other    Heart attack Other    Cancer Other    Breast cancer Other    Hyperlipidemia Other    Stomach cancer Neg Hx    Esophageal cancer Neg Hx    Colon cancer Neg Hx    Colon polyps Neg Hx    Rectal cancer Neg Hx     Social History   Socioeconomic History   Marital status: Single    Spouse name: Not on file   Number of children: 0   Years of education: Not on file   Highest education level: Master's degree (e.g., MA, MS, MEng, MEd, MSW, MBA)  Occupational History    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[employer])   Occupation: [employer]  Tobacco Use   Smoking status: Never   Smokeless tobacco: Never  Vaping Use   Vaping status: Never Used  Substance and Sexual Activity   Alcohol use: Not Currently    Alcohol/week: 3.0 standard drinks of alcohol    Types: 3 Standard drinks or equivalent per week    Comment: social-monthly   Drug use: Never   Sexual activity: Yes  Other Topics Concern   Not on file  Social History Narrative   Not on file   Social Drivers of Health   Financial Resource Strain: Medium Risk (04/30/2024)   Overall Financial Resource Strain (CARDIA)    Difficulty of Paying Living Expenses: Somewhat hard  Food Insecurity: Unknown (06/03/2024)   Hunger Vital Sign    Worried About Running Out of Food in the Last Year: Patient declined    Ran Out of Food in the Last Year: Never true  Transportation Needs: No Transportation Needs (04/30/2024)   PRAPARE - Administrator, Civil Service (Medical): No    Lack of Transportation (Non-Medical): No  Physical Activity: Sufficiently Active (04/30/2024)   Exercise Vital Sign    Days of Exercise per Week: 3 days    Minutes of Exercise per Session: 90 min  Stress: Stress Concern Present (04/30/2024)   [employer] of Occupational Health - Occupational Stress Questionnaire    Feeling of Stress: To some extent  Social Connections: Moderately Integrated (04/30/2024)   Social Connection and Isolation Panel    Frequency of Communication with Friends and Family: More than three times a week    Frequency of Social Gatherings with Friends and Family: Once a week    Attends Religious Services: More than 4 times per year    Active Member of [employer] or Organizations: Yes    Attends Banker Meetings: More than 4 times per year    Marital Status: Never married  Intimate Partner Violence: Not At Risk (10/15/2023)   Humiliation, Afraid, Rape, and Kick questionnaire     Fear of Current or Ex-Partner: No    Emotionally Abused: No    Physically Abused: No    Sexually Abused: No    Review of Systems: All other review of systems negative except as mentioned in the HPI.  Physical Exam: Vital signs BP 128/84   Pulse 73   Temp 98.4 F (36.9 C) (Temporal)   Resp 13   Ht 5' 10 (1.778 m)   Wt 268 lb (121.6 kg)   SpO2 97%   BMI 38.45 kg/m   General:   Alert,  Well-developed, pleasant and cooperative in NAD Lungs:  Clear throughout to auscultation.   Heart:  Regular  rate and rhythm Abdomen:  Soft, nontender and nondistended.   Neuro/Psych:  Alert and cooperative. Normal mood and affect. A and O x 3  Marcey Naval, MD [facility]

## 2024-07-10 NOTE — Progress Notes (Signed)
 1340 Ephedrine 10 mg given IV due to low BP, MD updated.

## 2024-07-10 NOTE — Progress Notes (Signed)
 Report given to PACU, vss

## 2024-07-10 NOTE — Patient Instructions (Addendum)
 -Handout on polyps and diverticulosis provided. -await pathology results. -repeat colonoscopy for surveillance recommended. Date to be determined when pathology result become available.  -Continue present medications.Samples of  Voquena 10 mg given. Take one daily. If this works, please call office for a prescription.   YOU HAD AN ENDOSCOPIC PROCEDURE TODAY AT THE [facility]:   Refer to the procedure report that was given to you for any specific questions about what was found during the examination.  If the procedure report does not answer your questions, please call your gastroenterologist to clarify.  If you requested that your care partner not be given the details of your procedure findings, then the procedure report has been included in a sealed envelope for you to review at your convenience later.  YOU SHOULD EXPECT: Some feelings of bloating in the abdomen. Passage of more gas than usual.  Walking can help get rid of the air that was put into your GI tract during the procedure and reduce the bloating. If you had a lower endoscopy (such as a colonoscopy or flexible sigmoidoscopy) you may notice spotting of blood in your stool or on the toilet paper. If you underwent a bowel prep for your procedure, you may not have a normal bowel movement for a few days.  Please Note:  You might notice some irritation and congestion in your nose or some drainage.  This is from the oxygen used during your procedure.  There is no need for concern and it should clear up in a day or so.  SYMPTOMS TO REPORT IMMEDIATELY:  Following lower endoscopy (colonoscopy or flexible sigmoidoscopy):  Excessive amounts of blood in the stool  Significant tenderness or worsening of abdominal pains  Swelling of the abdomen that is new, acute  Fever of 100F or higher  Following upper endoscopy (EGD)  Vomiting of blood or coffee ground material  New chest pain or pain under the shoulder blades  Painful or  persistently difficult swallowing  New shortness of breath  Fever of 100F or higher  Black, tarry-looking stools  For urgent or emergent issues, a gastroenterologist can be reached at any hour by calling [phone]. Do not use MyChart messaging for urgent concerns.    DIET:  We do recommend a small meal at first, but then you may proceed to your regular diet.  Drink plenty of fluids but you should avoid alcoholic beverages for 24 hours.  ACTIVITY:  You should plan to take it easy for the rest of today and you should NOT DRIVE or use heavy machinery until tomorrow (because of the sedation medicines used during the test).    FOLLOW UP: Our staff will call the number listed on your records the next business day following your procedure.  We will call around 7:15- 8:00 am to check on you and address any questions or concerns that you may have regarding the information given to you following your procedure. If we do not reach you, we will leave a message.     If any biopsies were taken you will be contacted by phone or by letter within the next 1-3 weeks.  Please call us  at [phone] if you have not heard about the biopsies in 3 weeks.    SIGNATURES/CONFIDENTIALITY: You and/or your care partner have signed paperwork which will be entered into your electronic medical record.  These signatures attest to the fact that that the information above on your After Visit Summary has been reviewed and is understood.  Full responsibility of the confidentiality of this discharge information lies with you and/or your care-partner.

## 2024-07-10 NOTE — Progress Notes (Signed)
 Called to room to assist during endoscopic procedure.  Patient ID and intended procedure confirmed with present staff. Received instructions for my participation in the procedure from the performing physician.

## 2024-07-10 NOTE — Progress Notes (Signed)
1314 Robinul 0.1 mg IV given due large amount of secretions upon assessment.  MD made aware, vss

## 2024-07-13 ENCOUNTER — Telehealth: Payer: Self-pay | Admitting: *Deleted

## 2024-07-13 ENCOUNTER — Encounter: Payer: Self-pay | Admitting: Gastroenterology

## 2024-07-13 MED ORDER — VOQUEZNA 10 MG PO TABS: 1.0000 | ORAL_TABLET | Freq: Every day | ORAL | 3 refills | Status: DC

## 2024-07-13 NOTE — Telephone Encounter (Signed)
 Rx sent to [facility] for Voquezna  10 mg daily #90 with 3 refills.

## 2024-07-13 NOTE — Telephone Encounter (Signed)
  Follow up Call-     07/10/2024   12:46 PM 07/17/2022    1:25 PM  Call back number  Post procedure Call Back phone  # [phone]  Permission to leave phone message Yes Yes     Patient questions:  Do you have a fever, pain , or abdominal swelling? Yes.   Pain Score  6 *  Have you tolerated food without any problems? No.  Have you been able to return to your normal activities? Yes.    Do you have any questions about your discharge instructions: Diet   No. Medications  Yes.   Follow up visit  Yes.    Do you have questions or concerns about your Care? No.  Actions: * If pain score is 4 or above: Physician/ provider Notified : Elspeth SQUIBB. Armbruster, MD.  Pt stated he has pain 6/7 out of 10 in upper left quadrant - pain is not new and occurs when eating.  Pt on trial of Voquenza.  Pt stated improvement with Voquezna  and asked for prescription to be sent to [facility] on [address].  Per pt, he stated he will likely need prior authorization for prescription.  Pt also asked for information on capsule study.  He would like to proceed with study and schedule appointment if possible.  He will be changing insurance in November.

## 2024-07-13 NOTE — Addendum Note (Signed)
 Addended by: CLAUDENE NAOMIE SAILOR on: 07/13/2024 08:24 AM   Modules accepted: Orders

## 2024-07-13 NOTE — Telephone Encounter (Signed)
 Thanks Maggie.  Pod A RN - can you help order him Voquezna  10mg  / day for 90 days with 3 refills and see if covered by insurance. He has been on a few different PPIs in the past. I am awaiting pathology results from his procedure before he pursue capsule endoscopy. Will contact him with recommendations when those results are back. Thanks

## 2024-07-13 NOTE — Telephone Encounter (Signed)
 Called pt back regarding request to schedule capsule endoscopy study.  Dr. Leigh waiting on biopsy results and will follow up with pt with additional steps.  Pt verbalized understanding.

## 2024-07-14 ENCOUNTER — Other Ambulatory Visit: Payer: Self-pay

## 2024-07-14 ENCOUNTER — Other Ambulatory Visit (HOSPITAL_COMMUNITY): Payer: Self-pay

## 2024-07-14 ENCOUNTER — Telehealth: Payer: Self-pay

## 2024-07-14 MED ORDER — VOQUEZNA 10 MG PO TABS: 1.0000 | ORAL_TABLET | Freq: Every day | ORAL | 3 refills | Status: AC

## 2024-07-14 NOTE — Telephone Encounter (Signed)
 Inbound call from [employer] company [employer] rep named Levan B stated that this pt medication called Voquezna  20 MG tablets were denied. A good call back number for [employer] is [phone] press option 3 then option 1. Please advise.

## 2024-07-14 NOTE — Telephone Encounter (Signed)
 Pharmacy Patient Advocate Encounter   Received notification from CoverMyMeds that prior authorization for Voquezna  20MG  tablets is required/requested.   Insurance verification completed.   The patient is insured through [facility] .   Per test claim: PA required; PA submitted to above mentioned insurance via Latent Key/confirmation #/EOC [id number] Status is pending

## 2024-07-15 NOTE — Telephone Encounter (Signed)
 See MyChart message. Script sent to [facility].  Patient to pay $50 for 30 day supply. Voquezna  will be mailed to him

## 2024-07-16 ENCOUNTER — Ambulatory Visit: Payer: Self-pay | Admitting: Gastroenterology

## 2024-07-16 LAB — SURGICAL PATHOLOGY

## 2024-07-16 NOTE — Telephone Encounter (Signed)
 Pharmacy Patient Advocate Encounter  Received notification from [facility] that Prior Authorization for Voquezna  20 MG tablets has been DENIED.  Full denial letter will be uploaded to the media tab. See denial reason below.  This medication is not on the formulary. The member must try and fail (did not work), or be unable to take ALL formulary alternatives (due to interactions, side effects, etc.)  In this case, other formulary alternatives are available for the member to take. The member has tried generic pantoprazole .   Alternative medications include (please refer to member's formulary): generic rabeprazole, generic dexlansoprazole, etc.  PA #/Case ID/Reference #: [id number]

## 2024-07-19 ENCOUNTER — Other Ambulatory Visit: Payer: Self-pay | Admitting: Family Medicine

## 2024-07-19 DIAGNOSIS — F5104 Psychophysiologic insomnia: Secondary | ICD-10-CM

## 2024-07-20 ENCOUNTER — Telehealth: Payer: Self-pay

## 2024-07-20 ENCOUNTER — Other Ambulatory Visit (HOSPITAL_COMMUNITY): Payer: Self-pay

## 2024-07-20 NOTE — Telephone Encounter (Signed)
 Requested Prescriptions   Pending Prescriptions Disp Refills   zolpidem  (AMBIEN ) 10 MG tablet [Pharmacy Med Name: ZOLPIDEM  10MG  TABLETS] 90 tablet     Sig: TAKE 1 TABLET(10 MG) BY MOUTH AT BEDTIME     Date of patient request: 07/20/24 Last office visit: 06/03/2024 Upcoming visit: 09/03/2024 Date of last refill: 04/20/24 Last refill amount: 90 tablets

## 2024-07-20 NOTE — Telephone Encounter (Signed)
 Requested Prescriptions   Pending Prescriptions Disp Refills   zolpidem  (AMBIEN ) 10 MG tablet [Pharmacy Med Name: ZOLPIDEM  10MG  TABLETS] 90 tablet     Sig: TAKE 1 TABLET(10 MG) BY MOUTH AT BEDTIME     Date of patient request: 07/20/2024 Last office visit: 06/03/2024 Upcoming visit: 09/03/2024 Date of last refill: 04/20/2024 Last refill amount: 90

## 2024-07-20 NOTE — Telephone Encounter (Signed)
 Pharmacy Patient Advocate Encounter   Received notification from CoverMyMeds that prior authorization for Voquezna  10MG  tablets is required/requested.   Insurance verification completed.   The patient is insured through [facility] .   Per test claim: PA required; PA submitted to above mentioned insurance via Latent Key/confirmation #/[id number] Status is pending

## 2024-07-21 NOTE — Telephone Encounter (Signed)
 Pharmacy Patient Advocate Encounter  Received notification from [facility] that Prior Authorization for Voquezna  10MG  tablets has been DENIED.  Full denial letter will be uploaded to the media tab. See denial reason below.  This medication is not on the formulary. The member must try and fail (did not work), or be unable to take ALL formulary alternatives (due to interactions, side effects, etc.)   In this case, other formulary alternatives are available for the member to take. The member has tried generic pantoprazole .    Alternative medications include (please refer to member's formulary): generic rabeprazole, generic dexlansoprazole, etc.  PA #/Case ID/Reference #: [id number]

## 2024-07-21 NOTE — Telephone Encounter (Signed)
 Medication discussed at his June 27 visit.  Controlled substance database reviewed.  Ambien  10 mg #90 last filled on 04/20/2024, due for refill today.  Refill ordered.

## 2024-07-23 DIAGNOSIS — K5902 Outlet dysfunction constipation: Secondary | ICD-10-CM | POA: Diagnosis not present

## 2024-07-23 DIAGNOSIS — M62838 Other muscle spasm: Secondary | ICD-10-CM | POA: Diagnosis not present

## 2024-07-23 DIAGNOSIS — M6281 Muscle weakness (generalized): Secondary | ICD-10-CM | POA: Diagnosis not present

## 2024-07-23 DIAGNOSIS — M6289 Other specified disorders of muscle: Secondary | ICD-10-CM | POA: Diagnosis not present

## 2024-07-27 DIAGNOSIS — J301 Allergic rhinitis due to pollen: Secondary | ICD-10-CM | POA: Diagnosis not present

## 2024-07-27 DIAGNOSIS — J3089 Other allergic rhinitis: Secondary | ICD-10-CM | POA: Diagnosis not present

## 2024-07-28 DIAGNOSIS — B9689 Other specified bacterial agents as the cause of diseases classified elsewhere: Secondary | ICD-10-CM | POA: Diagnosis not present

## 2024-07-28 DIAGNOSIS — J209 Acute bronchitis, unspecified: Secondary | ICD-10-CM | POA: Diagnosis not present

## 2024-07-29 ENCOUNTER — Other Ambulatory Visit: Payer: Self-pay | Admitting: Family Medicine

## 2024-07-29 DIAGNOSIS — F411 Generalized anxiety disorder: Secondary | ICD-10-CM

## 2024-07-29 NOTE — Telephone Encounter (Signed)
 Controlled substance database reviewed.  Alprazolam  No. 60 last filled on 06/27/2024.  Consistent refills previously.  No concerns.  Medication discussed at office visit in June.  Refill ordered.

## 2024-08-04 ENCOUNTER — Other Ambulatory Visit: Payer: Self-pay | Admitting: Family Medicine

## 2024-08-04 DIAGNOSIS — R7989 Other specified abnormal findings of blood chemistry: Secondary | ICD-10-CM

## 2024-08-07 ENCOUNTER — Other Ambulatory Visit: Payer: Self-pay | Admitting: Family Medicine

## 2024-08-07 DIAGNOSIS — G43109 Migraine with aura, not intractable, without status migrainosus: Secondary | ICD-10-CM

## 2024-08-10 DIAGNOSIS — J301 Allergic rhinitis due to pollen: Secondary | ICD-10-CM | POA: Diagnosis not present

## 2024-08-10 DIAGNOSIS — J3089 Other allergic rhinitis: Secondary | ICD-10-CM | POA: Diagnosis not present

## 2024-08-13 ENCOUNTER — Encounter: Payer: Self-pay | Admitting: Family Medicine

## 2024-08-13 DIAGNOSIS — R7989 Other specified abnormal findings of blood chemistry: Secondary | ICD-10-CM

## 2024-08-13 MED ORDER — VITAMIN D (ERGOCALCIFEROL) 1.25 MG (50000 UNIT) PO CAPS: ORAL_CAPSULE | ORAL | 1 refills | Status: DC

## 2024-08-13 NOTE — Addendum Note (Signed)
 Addended by: LEVORA PURCHASE R on: 08/13/2024 10:29 PM   Modules accepted: Orders

## 2024-08-25 ENCOUNTER — Other Ambulatory Visit: Payer: Self-pay | Admitting: Family Medicine

## 2024-08-25 DIAGNOSIS — E538 Deficiency of other specified B group vitamins: Secondary | ICD-10-CM

## 2024-08-26 ENCOUNTER — Telehealth: Payer: Self-pay | Admitting: Cardiovascular Disease

## 2024-08-26 DIAGNOSIS — J301 Allergic rhinitis due to pollen: Secondary | ICD-10-CM | POA: Diagnosis not present

## 2024-08-26 DIAGNOSIS — J3089 Other allergic rhinitis: Secondary | ICD-10-CM | POA: Diagnosis not present

## 2024-08-26 NOTE — Telephone Encounter (Signed)
 Called patient of Dr. Wonda. Last seen 04/2023.  His exercise routine: AM spin class followed by lifting   Three times in the afternoon around 3pm, he has had episodes that feels like he is being shocked. Very brief. Feels like he is being tazed and then resolves. He has tightness in left side of chest.  Scheduled for 09/03/24 with MD  He is also asking if Dr. Wonda will accept his mother as a new patient - stress test indicated ischemia. Advised will send to [facility] about this. Also advised could be discussed at visit next week.

## 2024-08-26 NOTE — Telephone Encounter (Signed)
 Pt calling to f/u with Dr. Ricardo nurse. Pt has been having shock episodes after exercising. Shock is the best he can explain the sensation. Please advise.

## 2024-08-27 ENCOUNTER — Other Ambulatory Visit: Payer: Self-pay | Admitting: Family Medicine

## 2024-08-27 DIAGNOSIS — F411 Generalized anxiety disorder: Secondary | ICD-10-CM

## 2024-08-27 NOTE — Telephone Encounter (Signed)
 Medication discussed at June office visit.  Controlled substance database reviewed.  #60 alprazolam  last filled on 07/30/2024, previously 06/27/2024, 05/29/2024.  Refill ordered.

## 2024-08-27 NOTE — Telephone Encounter (Signed)
 Requested Prescriptions   Pending Prescriptions Disp Refills   ALPRAZolam  (XANAX ) 1 MG tablet [Pharmacy Med Name: ALPRAZOLAM  1MG  TABLETS] 60 tablet     Sig: TAKE 1 TABLET BY MOUTH TWICE DAILY AS NEEDED     Date of patient request: 08/27/2024 Last office visit: 06/03/2024 Upcoming visit: 09/03/2024 Date of last refill: 07/29/2024 Last refill amount: 60

## 2024-08-29 ENCOUNTER — Other Ambulatory Visit: Payer: Self-pay | Admitting: Family Medicine

## 2024-08-29 DIAGNOSIS — G43109 Migraine with aura, not intractable, without status migrainosus: Secondary | ICD-10-CM

## 2024-08-31 ENCOUNTER — Other Ambulatory Visit (HOSPITAL_COMMUNITY): Payer: Self-pay

## 2024-08-31 NOTE — Telephone Encounter (Signed)
 Patient is returning call.

## 2024-08-31 NOTE — Telephone Encounter (Signed)
 LMTCB with pt to discuss getting pt's mother scheduled for new pt visit with Dr. Wonda.

## 2024-09-01 ENCOUNTER — Encounter: Payer: Self-pay | Admitting: Family Medicine

## 2024-09-01 NOTE — Telephone Encounter (Signed)
Attempted to call pt, unable to reach. LMTCB.

## 2024-09-02 NOTE — Telephone Encounter (Signed)
 Did he need a PA? He has a coupon to get it for $12 this month

## 2024-09-03 ENCOUNTER — Ambulatory Visit: Attending: Cardiovascular Disease | Admitting: Cardiovascular Disease

## 2024-09-03 ENCOUNTER — Ambulatory Visit (INDEPENDENT_AMBULATORY_CARE_PROVIDER_SITE_OTHER): Admitting: Family Medicine

## 2024-09-03 ENCOUNTER — Ambulatory Visit: Attending: Cardiovascular Disease

## 2024-09-03 ENCOUNTER — Other Ambulatory Visit (HOSPITAL_COMMUNITY)
Admission: RE | Admit: 2024-09-03 | Discharge: 2024-09-03 | Disposition: A | Discharge status: Home / Self Care | Source: Ambulatory Visit | Attending: Family Medicine | Admitting: Family Medicine

## 2024-09-03 ENCOUNTER — Other Ambulatory Visit (HOSPITAL_COMMUNITY): Payer: Self-pay

## 2024-09-03 ENCOUNTER — Encounter: Payer: Self-pay | Admitting: Cardiovascular Disease

## 2024-09-03 ENCOUNTER — Encounter: Payer: Self-pay | Admitting: Family Medicine

## 2024-09-03 ENCOUNTER — Telehealth: Payer: Self-pay

## 2024-09-03 VITALS — BP 100/60 | HR 82 | Temp 98.0°F | Resp 17 | Ht 70.0 in | Wt 267.0 lb

## 2024-09-03 VITALS — BP 120/82 | HR 77 | Ht 71.0 in | Wt 267.0 lb

## 2024-09-03 DIAGNOSIS — E611 Iron deficiency: Secondary | ICD-10-CM

## 2024-09-03 DIAGNOSIS — E782 Mixed hyperlipidemia: Secondary | ICD-10-CM | POA: Diagnosis not present

## 2024-09-03 DIAGNOSIS — E538 Deficiency of other specified B group vitamins: Secondary | ICD-10-CM

## 2024-09-03 DIAGNOSIS — I10 Essential (primary) hypertension: Secondary | ICD-10-CM

## 2024-09-03 DIAGNOSIS — I25118 Atherosclerotic heart disease of native coronary artery with other forms of angina pectoris: Secondary | ICD-10-CM

## 2024-09-03 DIAGNOSIS — R7989 Other specified abnormal findings of blood chemistry: Secondary | ICD-10-CM | POA: Diagnosis not present

## 2024-09-03 DIAGNOSIS — R000 Tachycardia, unspecified: Secondary | ICD-10-CM

## 2024-09-03 DIAGNOSIS — Z113 Encounter for screening for infections with a predominantly sexual mode of transmission: Secondary | ICD-10-CM | POA: Insufficient documentation

## 2024-09-03 DIAGNOSIS — Z23 Encounter for immunization: Secondary | ICD-10-CM

## 2024-09-03 DIAGNOSIS — Z79899 Other long term (current) drug therapy: Secondary | ICD-10-CM | POA: Diagnosis not present

## 2024-09-03 DIAGNOSIS — R7309 Other abnormal glucose: Secondary | ICD-10-CM

## 2024-09-03 DIAGNOSIS — R079 Chest pain, unspecified: Secondary | ICD-10-CM

## 2024-09-03 LAB — BASIC METABOLIC PANEL WITH GFR
BUN: 11 mg/dL (ref 6–23)
CO2: 27 meq/L (ref 19–32)
Calcium: 9.1 mg/dL (ref 8.4–10.5)
Chloride: 103 meq/L (ref 96–112)
Creatinine, Ser: 1.23 mg/dL (ref 0.40–1.50)
GFR: 66.1 mL/min (ref >60.00)
Glucose, Bld: 97 mg/dL (ref 70–99)
Potassium: 4.2 meq/L (ref 3.5–5.1)
Sodium: 138 meq/L (ref 135–145)

## 2024-09-03 LAB — VITAMIN B12: Vitamin B-12: 716 pg/mL (ref 211–911)

## 2024-09-03 LAB — VITAMIN D 25 HYDROXY (VIT D DEFICIENCY, FRACTURES): VITD: 55.17 ng/mL (ref 30.00–100.00)

## 2024-09-03 LAB — HEMOGLOBIN A1C: Hgb A1c MFr Bld: 5.6 % (ref 4.6–6.5)

## 2024-09-03 MED ORDER — FUROSEMIDE 20 MG PO TABS
20.0000 mg | ORAL_TABLET | Freq: Every day | ORAL | 3 refills | Status: AC
Start: 2024-09-03 — End: ?

## 2024-09-03 MED ORDER — VALSARTAN 80 MG PO TABS: 80.0000 mg | ORAL_TABLET | Freq: Every day | ORAL | 3 refills | Status: AC

## 2024-09-03 MED ORDER — ROSUVASTATIN CALCIUM 10 MG PO TABS: 10.0000 mg | ORAL_TABLET | Freq: Every day | ORAL | 3 refills | Status: AC

## 2024-09-03 MED ORDER — METOPROLOL SUCCINATE ER 50 MG PO TB24: 50.0000 mg | ORAL_TABLET | Freq: Every day | ORAL | 3 refills | Status: AC

## 2024-09-03 NOTE — Assessment & Plan Note (Signed)
 Blood pressure is now well-controlled on valsartan  and metoprolol  succinate.

## 2024-09-03 NOTE — Patient Instructions (Signed)
 Medication Instructions:  No medication changes were made at this visit. Continue current regimen.   *If you need a refill on your cardiac medications before your next appointment, please call your pharmacy*  Lab Work: None ordered today. If you have labs (blood work) drawn today and your tests are completely normal, you will receive your results only by: MyChart Message (if you have MyChart) OR A paper copy in the mail If you have any lab test that is abnormal or we need to change your treatment, we will call you to review the results.  Testing/Procedures: Your physician has requested that you wear a Zio heart monitor for 3 days. This will be mailed to your home with instructions on how to apply the monitor and how to return it when finished. Please allow 2 weeks after returning the heart monitor before our office calls you with the results.   Your physician has requested that you have an echocardiogram. Echocardiography is a painless test that uses sound waves to create images of your heart. It provides your doctor with information about the size and shape of your heart and how well your heart's chambers and valves are working. This procedure takes approximately one hour. There are no restrictions for this procedure. Please do NOT wear cologne, perfume, aftershave, or lotions (deodorant is allowed). Please arrive 15 minutes prior to your appointment time.  Please note: We ask at that you not bring children with you during ultrasound (echo/ vascular) testing. Due to room size and safety concerns, children are not allowed in the ultrasound rooms during exams. Our front office staff cannot provide observation of children in our lobby area while testing is being conducted. An adult accompanying a patient to their appointment will only be allowed in the ultrasound room at the discretion of the ultrasound technician under special circumstances. We apologize for any inconvenience.   Follow-Up: At  [facility], you and your health needs are our priority.  As part of our continuing mission to provide you with exceptional heart care, our providers are all part of one team.  This team includes your primary Cardiologist (physician) and Advanced Practice Providers or APPs (Physician Assistants and Nurse Practitioners) who all work together to provide you with the care you need, when you need it.  Your next appointment:   1 year(s)  Provider:   Ozell Fell, MD    We recommend signing up for the patient portal called MyChart.  Sign up information is provided on this After Visit Summary.  MyChart is used to connect with patients for Virtual Visits (Telemedicine).  Patients are able to view lab/test results, encounter notes, upcoming appointments, etc.  Non-urgent messages can be sent to your provider as well.   To learn more about what you can do with MyChart, go to [email or web address].   Other Instructions ZIO XT- Long Term Monitor Instructions  Your physician has requested you wear a ZIO patch monitor for 3 days.   This is a single patch monitor. [employer] supplies one patch monitor per enrollment. Additional  stickers are not available. Please do not apply patch if you will be having a Nuclear Stress Test,  Echocardiogram, Cardiac CT, MRI, or Chest Xray during the period you would be wearing the  monitor. The patch cannot be worn during these tests. You cannot remove and re-apply the  ZIO XT patch monitor.   Your ZIO patch monitor will be mailed 3 day USPS to your address on file. It may take 3-5  days  to receive your monitor after you have been enrolled.   Once you have received your monitor, please review the enclosed instructions. Your monitor  has already been registered assigning a specific monitor serial # to you.     Billing and Patient Assistance Program Information  We have supplied [employer] with any of your insurance information on file for billing purposes.   [employer] offers a sliding scale Patient Assistance Program for patients that do not have  insurance, or whose insurance does not completely cover the cost of the ZIO monitor.  You must apply for the Patient Assistance Program to qualify for this discounted rate.   To apply, please call [employer] at [phone], select option 4, select option 2, ask to apply for  Patient Assistance Program. Meredeth will ask your household income, and how many people  are in your household. They will quote your out-of-pocket cost based on that information.  [employer] will also be able to set up a 39-month, interest-free payment plan if needed.     Applying the monitor  Shave hair from upper left chest.  Hold abrader disc by orange tab. Rub abrader in 40 strokes over the upper left chest as  indicated in your monitor instructions.  Clean area with 4 enclosed alcohol pads. Let dry.  Apply patch as indicated in monitor instructions. Patch will be placed under collarbone on left  side of chest with arrow pointing upward.  Rub patch adhesive wings for 2 minutes. Remove white label marked 1. Remove the white  label marked 2. Rub patch adhesive wings for 2 additional minutes.  While looking in a mirror, press and release button in center of patch. A small green light will  flash 3-4 times. This will be your only indicator that the monitor has been turned on.  Do not shower for the first 24 hours. You may shower after the first 24 hours.  Press the button if you feel a symptom. You will hear a small click. Record Date, Time and  Symptom in the Patient Logbook.  When you are ready to remove the patch, follow instructions on the last 2 pages of Patient  Logbook. Stick patch monitor onto the last page of Patient Logbook.   Place Patient Logbook in the blue and white box. Use locking tab on box and tape box closed  securely. The blue and white box has prepaid postage on it. Please place it in the mailbox as  soon as  possible. Your physician should have your test results approximately 7 days after the  monitor has been mailed back to [facility].   Call [facility] Customer Care at [phone] if you have questions regarding  your ZIO XT patch monitor. Call them immediately if you see an orange light blinking on your  monitor.   If your monitor falls off in less than 4 days, contact our Monitor department at [phone].   If your monitor becomes loose or falls off after 4 days call [employer] at [phone] for  suggestions on securing your monitor.

## 2024-09-03 NOTE — Progress Notes (Signed)
 Cardiology Office Note:    Date:  09/03/2024   ID:  Randall Reed., DOB [birth year], MRN [id number]  PCP:  Levora Reyes SAUNDERS, MD   Randall Reed Providers Cardiologist:  Ozell Fell, MD     Referring MD: Levora Reyes SAUNDERS, MD   Chief Complaint  Patient presents with   Chest Pain    History of Present Illness:    Randall Reed. is a 53 y.o. male with a hx of obesity, hypertension, nonobstructive CAD, and tachycardia, presenting for follow-up evaluation today.  Most recent echo from March 04, 2024 shows LVEF 50% with mild global hypokinesis, normal RV function, and no significant valvular disease.  Coronary CT scan from 04/15/2023 showed a coronary calcium  score of 77, normal coronary origin with right dominance, and nonobstructive CAD with less than 50% stenosis in the RCA, no significant plaque in the left main, no significant plaque in the circumflex, and mild nonobstructive plaque in the LAD.   With his mild LV dysfunction and LVEF 50%, we tried to get him on GDMT but he was unable to tolerate even a low-dose of Entresto .  He followed up with Melissa in the Pharm.D. clinic and he was able to get on valsartan  which she seems to be tolerating fairly well.  He does have some episodes of tachycardia with exercise where his heart rate gets up into the 170s.  He feels bad with the send states that this is unusual for him but recently his heart rates have been increased well above what he used to see with exercise.  He has a constant chest pressure in the left chest that is not worsened by physical exertion.  No shortness of breath, orthopnea, PND.  He has some leg swelling and uses furosemide  as needed.  No lightheadedness or syncope.  Current Medications: Current Meds  Medication Sig   AIRSUPRA  90-80 MCG/ACT AERO Inhale 2 puffs into the lungs every 4 (four) hours as needed. (Patient taking differently: Inhale 2 puffs into the lungs 2 (two) times daily.)   albuterol   (PROVENTIL ) (2.5 MG/3ML) 0.083% nebulizer solution    albuterol  (VENTOLIN  HFA) 108 (90 Base) MCG/ACT inhaler INHALE 2 PUFFS INTO THE LUNGS EVERY 6 HOURS AS NEEDED FOR WHEEZING OR SHORTNESS OF BREATH   ALLEGRA ALLERGY 180 MG tablet Take 180 mg by mouth daily.   ALPRAZolam  (XANAX ) 1 MG tablet TAKE 1 TABLET BY MOUTH TWICE DAILY AS NEEDED   anastrozole (ARIMIDEX) 1 MG tablet Take 0.5 mg by mouth once a week.   aspirin  EC 81 MG tablet Take 1 tablet (81 mg total) by mouth daily.   Budeson-Glycopyrrol-Formoterol  (BREZTRI AEROSPHERE) 160-9-4.8 MCG/ACT AERO Inhale 2 puffs into the lungs in the morning and at bedtime.   budesonide  (PULMICORT ) 0.5 MG/2ML nebulizer solution 2 (two) times daily.   clotrimazole  (MYCELEX ) 10 MG troche SMARTSIG:1 Lozenge(s) By Mouth 5 Times Daily   cyanocobalamin  (VITAMIN B12) 1000 MCG tablet Take 1 tablet (1,000 mcg total) by mouth daily.   dicyclomine  (BENTYL ) 20 MG tablet Take 1 tablet (20 mg total) by mouth in the morning and at bedtime.   doxycycline  (VIBRA -TABS) 100 MG tablet Take 200 mg by mouth daily as needed.   DUPIXENT 300 MG/2ML prefilled syringe Inject 300 mg into the skin every 14 (fourteen) days.   emtricitabine -tenofovir  (TRUVADA ) 200-300 MG tablet TAKE 1 TABLET BY MOUTH DAILY   famotidine (PEPCID) 40 MG tablet Take 40 mg by mouth daily as needed for heartburn or indigestion.  ferrous sulfate  (FEROSUL) 325 (65 FE) MG tablet Take 1 tablet (325 mg total) by mouth daily with breakfast.   furosemide  (LASIX ) 20 MG tablet Take 1 tablet (20 mg total) by mouth daily. As needed for edema   ipratropium (ATROVENT ) 0.06 % nasal spray Place 2 sprays into the nose 2 (two) times daily.   ipratropium-albuterol  (DUONEB) 0.5-2.5 (3) MG/3ML SOLN Take 3 mLs by nebulization every 6 (six) hours as needed (Shortness of breath or wheezing).   metoprolol  succinate (TOPROL -XL) 50 MG 24 hr tablet Take 1 tablet (50 mg total) by mouth daily. Take with or immediately following a meal.    mirabegron ER (MYRBETRIQ) 25 MG TB24 tablet Take 25 mg by mouth daily as needed (bladder spasm).   montelukast  (SINGULAIR ) 10 MG tablet TAKE 1 TABLET(10 MG) BY MOUTH AT BEDTIME   olopatadine  (PATADAY ) 0.1 % ophthalmic solution Place 1 drop into both eyes 2 (two) times daily as needed for allergies.   Olopatadine -Mometasone  ([name]) 665-25 MCG/ACT SUSP Place 1 spray into the nose in the morning and at bedtime.   ondansetron  (ZOFRAN -ODT) 4 MG disintegrating tablet Take 1 tablet (4 mg total) by mouth every 8 (eight) hours as needed for nausea or vomiting.   pantoprazole  (PROTONIX ) 40 MG tablet Take 1 tablet (40 mg total) by mouth daily.   Potassium Chloride  ER 20 MEQ TBCR TAKE 1 TABLET(20 MEQ) BY MOUTH DAILY   pramoxine-hydrocortisone  (PROCTOCREAM-HC) 1-1 % rectal cream Place rectally 3 (three) times daily.   rizatriptan  (MAXALT -MLT) 10 MG disintegrating tablet DISSOLVE 1 TABLET IN MOUTH AS NEEDED FOR HEADACHE. REPEAT  IN  2  HOURS  AS  NEEDED.   rosuvastatin  (CRESTOR ) 10 MG tablet Take 1 tablet (10 mg total) by mouth daily.   sildenafil  (VIAGRA ) 100 MG tablet Take 1/2-1 tablet by mouth once daily as needed prior to sexual activity *MUST KEEP UPCOMING APPT*   Spacer/Aero-Holding Chambers DEVI Use with inhaler   tadalafil  (CIALIS ) 5 MG tablet Take 5 mg by mouth daily.   tamsulosin  (FLOMAX ) 0.4 MG CAPS capsule TAKE 1 CAPSULE(0.4 MG) BY MOUTH DAILY AS NEEDED   testosterone  cypionate (DEPOTESTOSTERONE CYPIONATE) 200 MG/ML injection Inject 100 mg into the muscle once a week.   valsartan  (DIOVAN ) 80 MG tablet Take 1 tablet (80 mg total) by mouth daily.   Vitamin D , Ergocalciferol , (DRISDOL ) 1.25 MG (50000 UNIT) CAPS capsule TAKE ONE CAPSULE BY MOUTH EVERY 7 DAYS   Vonoprazan Fumarate  (VOQUEZNA ) 10 MG TABS Take 1 tablet by mouth daily.   zolpidem  (AMBIEN ) 10 MG tablet TAKE 1 TABLET(10 MG) BY MOUTH AT BEDTIME   [DISCONTINUED] furosemide  (LASIX ) 20 MG tablet Take 1 tablet (20 mg total) by mouth daily. As  needed for edema   [DISCONTINUED] metoprolol  succinate (TOPROL -XL) 50 MG 24 hr tablet Take 1 tablet (50 mg total) by mouth daily. Take with or immediately following a meal.   [DISCONTINUED] rosuvastatin  (CRESTOR ) 10 MG tablet TAKE 1 TABLET(10 MG) BY MOUTH DAILY   [DISCONTINUED] valsartan  (DIOVAN ) 80 MG tablet Take 1 tablet (80 mg total) by mouth daily.     Allergies:   Desloratadine, Loratadine, Ketek [telithromycin], and Levbid  [hyoscyamine  sulfate]   ROS:   Please see the history of present illness.    All other systems reviewed and are negative.  EKGs/Labs/Other Studies Reviewed:    The following studies were reviewed today: Cardiac Studies & Procedures   ______________________________________________________________________________________________   STRESS TESTS  ECHOCARDIOGRAM STRESS TEST 10/30/2016  Narrative *[facility]* [address]  [place], [place] [address] [phone]  ------------------------------------------------------------------- Stress Echocardiography  Patient:    Armando, Bukhari MR #:       [id number] Study Date: 10/30/2016 Gender:     M Age:        89 Height:     180.3 cm Weight:     112.3 kg BSA:        2.41 m^2 Pt. Status: Room:  ATTENDING    Ozell Fell, MD ORDERING     Ozell Fell, MD REFERRING    Ozell Fell, MD SONOGRAPHER  Lenford Plunk, RDCS PERFORMING   [facility], Outpatient  cc:  -------------------------------------------------------------------  ------------------------------------------------------------------- Indications:      R07.9 Chest Pain.  ------------------------------------------------------------------- History:   PMH:  Anxiety.  Risk factors:  Obese.  Medications:  No other medications.  ------------------------------------------------------------------- Study Conclusions  - Baseline ECG: Normal sinus rhythm. - Stress ECG conclusions: Sinus tachycardia without ischemic EKG changes. -  Baseline: LVEF 55-60%, normal wall motion and thickening. - Peak stress: Expected hyperdynamic increase in LVEF to 75-80%, with normal wall motion and thickening. - Recovery: LVEF 60%, normal wall motion and thickening.  Impressions:  - Electrically and echocardiographically normal stress echocardiogram without evidence for ischemia at given workload. Excellent exercise tolerance without chest pain.  ------------------------------------------------------------------- Study data:   Study status:  Routine.  Consent:  The risks, benefits, and alternatives to the procedure were explained to the patient and informed consent was obtained.  Procedure:  The patient reported no pain pre or post test. Initial setup. The patient was brought to the laboratory. A baseline ECG was recorded. Surface ECG leads and automatic cuff blood pressure measurements were monitored. Treadmill exercise testing was performed using the Bruce protocol. The patient exercised for 10 min 50 sec, to protocol stage 4, to a maximal work rate of 12.9 mets. Exercise was terminated due to achievement of target heart rate, patient request, dyspnea, and fatigue. The patient was positioned for image acquisition and recovery monitoring. Transthoracic stress echocardiography for chest pain evaluation. Image quality was excellent. Images were captured at baseline and peak exercise. Study completion:  The patient tolerated the procedure well. There were no complications.          Bruce protocol. Stress echocardiography.  Birthdate:  Patient birthdate: [birth year].  Age: Patient is 53 yr old.  Sex:  Gender: male.    BMI: 34.5 kg/m^2. Blood pressure:     124/86  Patient status:  Outpatient.  Study date:  Study date: 10/30/2016. Study time: 02:50 PM.  -------------------------------------------------------------------  ------------------------------------------------------------------- Baseline ECG:   Normal sinus  rhythm.  ------------------------------------------------------------------- Stress protocol:  +---------------------+---+------------+----------------+ !Stage                !HR !BP (mmHg)   !Symptoms        ! +---------------------+---+------------+----------------+ !Baseline             !102!124/86 (99) !None            ! +---------------------+---+------------+----------------+ !Stage 1              !879!856/22 (99) !None            ! +---------------------+---+------------+----------------+ !Stage 2              !136!154/76 (102)!None            ! +---------------------+---+------------+----------------+ !Stage 3              !155!168/76 (107)!None            ! +---------------------+---+------------+----------------+ !Stage  4              !171!------------!Dyspnea, fatigue! +---------------------+---+------------+----------------+ !Immediate post stress!173!154/67 (96) !Subsiding       ! +---------------------+---+------------+----------------+ !Recovery; 1 min      !148!------------!None            ! +---------------------+---+------------+----------------+ !Recovery; 2 min      !133!137/58 (84) !None            ! +---------------------+---+------------+----------------+ !Recovery; 3 min      !125!------------!None            ! +---------------------+---+------------+----------------+ !Recovery; 4 min      !123!103/57 (72) !None            ! +---------------------+---+------------+----------------+ !Recovery; 5 min      !120!94/56 (69)  !None            ! +---------------------+---+------------+----------------+ !Late recovery        !887!878/38 (81) !None            ! +---------------------+---+------------+----------------+  ------------------------------------------------------------------- Stress results:   Maximal heart rate during stress was 173 bpm (100% of maximal predicted heart rate). The maximal predicted heart rate was 173 bpm.The target heart rate was achieved.  The heart rate response to stress was normal. There was a normal resting blood pressure with an appropriate response to stress. The rate-pressure product for the peak heart rate and blood pressure was 73357 mm Hg/min.  ------------------------------------------------------------------- Stress ECG:  Sinus tachycardia without ischemic EKG changes.  ------------------------------------------------------------------- Baseline:  Peak stress: Recovery:  ------------------------------------------------------------------- Prepared and Electronically Authenticated by  Vinie Maxcy MD 2017-12-26T17:14:36   ECHOCARDIOGRAM  ECHOCARDIOGRAM COMPLETE 03/04/2024  Narrative ECHOCARDIOGRAM REPORT    Patient Name:   Randall Reed. Date of Exam: 03/04/2024 Medical Rec #:  [id number]           Height:       70.5 in Accession #:    [id number]          Weight:       261.0 lb Date of Birth:  [birth year]            BSA:          2.350 m Patient Age:    52 years            BP:           124/70 mmHg Patient Gender: M                   HR:           85 bpm. Exam Location:  [address]  Procedure: 2D Echo, 3D Echo and Strain Analysis (Both Spectral and Color Flow Doppler were utilized during procedure).  Indications:    R60.0 Lower extremity edema  History:        Patient has prior history of Echocardiogram examinations, most recent 02/07/2022. CAD, Signs/Symptoms:Chest Pain and Fatigue; Risk Factors:Hypertension and Sleep Apnea. Palpitations. Asthma. Obesity.  Sonographer:    Jon Hacker RCS Referring Phys: [phone] Neko Mcgeehan  IMPRESSIONS   1. Left ventricular ejection fraction, by estimation, is 50%. The left ventricle has mildly decreased function. The left ventricle demonstrates global hypokinesis. Left ventricular diastolic parameters are consistent with Grade I diastolic dysfunction (impaired relaxation). The average left ventricular global longitudinal strain is -17.9 %. The  global longitudinal strain is abnormal. 2. Right ventricular systolic function is normal. The right ventricular size is normal. There is normal pulmonary artery systolic pressure. The estimated right ventricular systolic pressure is  25.3 mmHg. 3. The mitral valve is normal in structure. Trivial mitral valve regurgitation. No evidence of mitral stenosis. 4. The aortic valve is tricuspid. Aortic valve regurgitation is not visualized. No aortic stenosis is present. 5. Aortic dilatation noted. There is mild dilatation of the ascending aorta, measuring 40 mm. 6. The inferior vena cava is normal in size with greater than 50% respiratory variability, suggesting right atrial pressure of 3 mmHg.  FINDINGS Left Ventricle: Left ventricular ejection fraction, by estimation, is 50%. The left ventricle has mildly decreased function. The left ventricle demonstrates global hypokinesis. The average left ventricular global longitudinal strain is -17.9 %. Strain was performed and the global longitudinal strain is abnormal. The left ventricular internal cavity size was normal in size. There is no left ventricular hypertrophy. Left ventricular diastolic parameters are consistent with Grade I diastolic dysfunction (impaired relaxation).  Right Ventricle: The right ventricular size is normal. No increase in right ventricular wall thickness. Right ventricular systolic function is normal. There is normal pulmonary artery systolic pressure. The tricuspid regurgitant velocity is 2.36 m/s, and with an assumed right atrial pressure of 3 mmHg, the estimated right ventricular systolic pressure is 25.3 mmHg.  Left Atrium: Left atrial size was normal in size.  Right Atrium: Right atrial size was normal in size.  Pericardium: There is no evidence of pericardial effusion.  Mitral Valve: The mitral valve is normal in structure. Trivial mitral valve regurgitation. No evidence of mitral valve stenosis.  Tricuspid Valve: The  tricuspid valve is normal in structure. Tricuspid valve regurgitation is trivial.  Aortic Valve: The aortic valve is tricuspid. Aortic valve regurgitation is not visualized. No aortic stenosis is present.  Pulmonic Valve: The pulmonic valve was normal in structure. Pulmonic valve regurgitation is not visualized.  Aorta: Aortic dilatation noted. There is mild dilatation of the ascending aorta, measuring 40 mm.  Venous: The inferior vena cava is normal in size with greater than 50% respiratory variability, suggesting right atrial pressure of 3 mmHg.  IAS/Shunts: No atrial level shunt detected by color flow Doppler.   LEFT VENTRICLE PLAX 2D LVIDd:         5.28 cm   Diastology LVIDs:         4.00 cm   LV e' medial:    7.62 cm/s LV PW:         1.08 cm   LV E/e' medial:  9.8 LV IVS:        1.01 cm   LV e' lateral:   11.40 cm/s LVOT diam:     2.10 cm   LV E/e' lateral: 6.5 LV SV:         73 LV SV Index:   31        2D Longitudinal Strain LVOT Area:     3.46 cm  2D Strain GLS (A4C):   -18.0 % 2D Strain GLS (A3C):   -19.2 % 2D Strain GLS (A2C):   -16.5 % 2D Strain GLS Avg:     -17.9 %  3D Volume EF: 3D EF:        42 % LV EDV:       171 ml LV ESV:       100 ml LV SV:        72 ml  RIGHT VENTRICLE RV Basal diam:  2.22 cm RV S prime:     15.20 cm/s TAPSE (M-mode): 2.2 cm  LEFT ATRIUM             Index  RIGHT ATRIUM          Index LA diam:        3.60 cm 1.53 cm/m   RA Area:     8.47 cm LA Vol (A2C):   43.0 ml 18.30 ml/m  RA Volume:   14.10 ml 6.00 ml/m LA Vol (A4C):   44.2 ml 18.81 ml/m LA Biplane Vol: 46.0 ml 19.58 ml/m AORTIC VALVE LVOT Vmax:   113.00 cm/s LVOT Vmean:  73.900 cm/s LVOT VTI:    0.210 m  AORTA Ao Root diam: 3.60 cm Ao Asc diam:  4.00 cm  MITRAL VALVE               TRICUSPID VALVE MV Area (PHT): 3.68 cm    TR Peak grad:   22.3 mmHg MV Decel Time: 206 msec    TR Vmax:        236.00 cm/s MV E velocity: 74.40 cm/s MV A velocity: 93.30 cm/s   SHUNTS MV E/A ratio:  0.80        Systemic VTI:  0.21 m Systemic Diam: 2.10 cm  Dalton McleanMD Electronically signed by Randall Reed Signature Date/Time: 03/04/2024/9:17:45 AM    Final    MONITORS  LONG TERM MONITOR (3-14 DAYS) 02/14/2022  Narrative Patch Wear Time:  3 days and 1 hours (2023-03-31T22:24:43-0400 to 2023-04-03T23:29:07-399)  Patient had a min HR of 51 bpm, max HR of 158 bpm, and avg HR of 91 bpm. Predominant underlying rhythm was Sinus Rhythm. Isolated SVEs were occasional (1.0%, 4078), SVE Couplets were rare (<1.0%, 4), and SVE Triplets were rare (<1.0%, 3). Isolated VEs were rare (<1.0%), VE Couplets were rare (<1.0%), and no VE Triplets were present.  SUMMARY: The basic rhythm is normal sinus with an average HR of 91 bpm. There are rare supraventricular beats occurring with a burden of 1%. There is no afib, sustained arrhythmia, or bradycardic events.   CT SCANS  CT CORONARY MORPH W/CTA COR W/SCORE 04/15/2023  Addendum 04/21/2023  9:49 PM ADDENDUM REPORT: 04/21/2023 21:47  EXAM: OVER-READ INTERPRETATION  CT CHEST  The following report is an over-read performed by radiologist Dr. Oneil Devonshire of [facility] on 04/21/2023. This over-read does not include interpretation of cardiac or coronary anatomy or pathology. The coronary calcium  score/coronary CTA interpretation by the cardiologist is attached.  COMPARISON:  10/20/2021  FINDINGS: Cardiovascular: Ascending aorta is mildly prominent at 4 cm. No evidence of dissection is noted.  Mediastinum/Nodes: There are no enlarged lymph nodes within the visualized mediastinum.  Lungs/Pleura: There is no pleural effusion. The visualized lungs appear clear.  Upper abdomen: Cyst is noted measuring 2.8 cm in the central portion of the left lobe of the liver. This is stable from the prior exam.  Musculoskeletal/Chest wall: No chest wall mass or suspicious osseous findings within the visualized  chest.  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 Recommend annual imaging followup by CTA or MRA. This recommendation follows 2010 ACCF/AHA/AATS/ACR/ASA/SCA/SCAI/SIR/STS/SVM Guidelines for the Diagnosis and Management of Patients with Thoracic Aortic Disease. Circulation. 2010; 121: Z733-z630. Aortic aneurysm NOS (ICD10-I71.9)  Left hepatic cyst stable from 2022. No further follow-up is recommended.   Electronically Signed By: Oneil Devonshire M.D. On: 04/21/2023 21:47  Narrative CLINICAL DATA:  53 Year-old Male  EXAM: Cardiac/Coronary  CTA  TECHNIQUE: The patient was scanned on a [employer].  FINDINGS: Scan was triggered in the descending thoracic aorta. Axial non-contrast 3 mm slices were carried out through the heart. The data  set was analyzed on a dedicated work station and scored using the [employer] . Gantry rotation speed was 250 msecs and collimation was .6 mm. 0.8 mg of sl NTG was given. The 3D data set was reconstructed in 5% intervals of the 67-82 % of the R-R cycle. Diastolic phases were analyzed on a dedicated work station using MPR, MIP and VRT modes. The patient received 100 cc of contrast.  Coronary Arteries:  Normal coronary origin.  Right dominance.  Coronary Calcium  Score:  Left main: 0  Left anterior descending artery: 77  Left circumflex artery: 0  Right coronary artery: 0  Ramus intermedius artery: 0  Total: 77  Percentile: 78th for age, sex, and race matched control.  RCA is a large dominant artery that gives rise to PDA and PLA. Mild non-obstructive mixed plaque (25-49%) in the distal RCA.  Left main is a large artery that gives rise to LAD, RI, and LCX arteries. There is no significant plaque.  LAD is a large vessel that gives rise to one large D1 Branch. Mild non-obstructive calcified plaque (25-49%) in mid LAD.  LCX is a non-dominant artery that gives rise to one small OM1. There is no significant  plaque.  There is a ramus intermedius vessel is a small vessel. There is no significant plaque.  Other findings:  Aorta: Normal size.  No calcifications.  No dissection.  Main Pulmonary Artery: Normal size of the pulmonary artery.  Systemic Veins: Normal drainage  Aortic Valve:  Tri-leaflet.  No calcifications.  Mitral valve: No calcifications.  Normal pulmonary vein drainage into the left atrium.  Normal left atrial appendage without a thrombus.  Interatrial septum well visualized do the slab artifact.  Left Ventricle: Normal size  Left Atrium: Normal size  Right Ventricle: Normal size  Right Atrium: Normal size  Pericardium: Normal thickness  Extra-cardiac findings: See attached radiology report for non-cardiac structures.  Artifact: Slab artifact  IMPRESSION: 1. Coronary calcium  score of 77. This was 78th percentile for age, sex, and race matched control.  2. Normal coronary origin with right dominance.  3. CAD-RADS 2. Mild non-obstructive CAD (25-49%). Consider non-atherosclerotic causes of chest pain. Consider preventive therapy and risk factor modification.  RECOMMENDATIONS: RECOMMENDATIONS The proposed cut-off value of 1,651 AU yielded a 93 % sensitivity and 75 % specificity in grading AS severity in patients with classical low-flow, low-gradient AS. Proposed different cut-off values to define severe AS for men and women as 2,065 AU and 1,274 AU, respectively. The joint European and American recommendations for the assessment of AS consider the aortic valve calcium  score as a continuum - a very high calcium  score suggests severe AS and a low calcium  score suggests severe AS is unlikely.  Randall Reed, et al. 2017 ESC/EACTS Guidelines for the management of valvular heart disease. Eur Heart J 2017;38:2739-91.  Coronary artery calcium  (CAC) score is a strong predictor of incident coronary heart disease (CHD) and provides  predictive information beyond traditional risk factors. CAC scoring is reasonable to use in the decision to withhold, postpone, or initiate statin therapy in intermediate-risk or selected borderline-risk asymptomatic adults (age 69-75 years and LDL-C >=70 to <190 mg/dL) who do not have diabetes or established atherosclerotic cardiovascular disease (ASCVD).* In intermediate-risk (10-year ASCVD risk >=7.5% to <20%) adults or selected borderline-risk (10-year ASCVD risk >=5% to <7.5%) adults in whom a CAC score is measured for the purpose of making a treatment decision the following recommendations have been made:  If CAC =  0, it is reasonable to withhold statin therapy and reassess in 5 to 10 years, as long as higher risk conditions are absent (diabetes mellitus, family history of premature CHD in first degree relatives (males <55 years; females <65 years), cigarette smoking, LDL >=190 mg/dL or other independent risk factors).  If CAC is 1 to 99, it is reasonable to initiate statin therapy for patients >=56 years of age.  If CAC is >=100 or >=75th percentile, it is reasonable to initiate statin therapy at any age.  Cardiology referral should be considered for patients with CAC scores =400 or >=75th percentile.  *2018 AHA/ACC/AACVPR/AAPA/ABC/ACPM/ADA/AGS/APhA/ASPC/NLA/PCNA Guideline on the Management of Blood Cholesterol: A Report of the American College of Cardiology/American Heart Association Task Force on Clinical Practice Guidelines. J Am Coll Cardiol. 2019;73(24):3168-3209.  Randall Leavens, MD  Electronically Signed: By: Randall Reed M.D. On: 04/15/2023 16:57   CT SCANS  CT CORONARY MORPH W/CTA COR W/SCORE 04/24/2018  Addendum 04/24/2018  3:38 PM ADDENDUM REPORT: 04/24/2018 15:35  CLINICAL DATA:  53 year old male with h/o obesity, DM, strong family h/o premature CAD now presenting with worsening DOE. Prior coronary CTA with non-obstructive  CAD.  EXAM: Cardiac/Coronary  CT  TECHNIQUE: The patient was scanned on a [employer].  FINDINGS: A 120 kV prospective scan was triggered in the descending thoracic aorta at 111 HU's. Axial non-contrast 3 mm slices were carried out through the heart. The data set was analyzed on a dedicated work station and scored using the Agatson method. Gantry rotation speed was 250 msecs and collimation was .6 mm. No beta blockade and 0.8 mg of sl NTG was given. The 3D data set was reconstructed in 5% intervals of the 67-82 % of the R-R cycle. Diastolic phases were analyzed on a dedicated work station using MPR, MIP and VRT modes. The patient received 80 cc of contrast.  Aorta:  Normal size.  No calcifications.  No dissection.  Aortic Valve:  Trileaflet.  No calcifications.  Coronary Arteries:  Normal coronary origin.  Right dominance.  RCA is a large dominant artery that gives rise to PDA and PLVB. There is minimal non-calcified plaque.  Left main is a large artery that gives rise to LAD, a very small ramus intermedius and LCX arteries. Left main has no plaque.  LAD is a medium caliber vessel that gives rise to one diagonal artery. Proximal LAD has a mild mixed plaque wt the takeoff of the first diagonal artery with associated stenosis 25-50%.  D1 has no significant plaque.  RI is a very small artery that has no plaque.  LCX is a non-dominant artery that gives rise to one large OM1 branch. There is no plaque.  Other findings:  Normal pulmonary vein drainage into the left atrium.  Normal let atrial appendage without a thrombus.  Normal size of the pulmonary artery.  IMPRESSION: 1. Coronary calcium  score of 7. This was 86 percentile for age and sex matched control.  2. Normal coronary origin with right dominance.  3. 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 Aggressive risk factor modification is recommended.   Electronically  Signed By: Leim Moose On: 04/24/2018 15:35  Narrative EXAM: OVER-READ INTERPRETATION  CT CHEST  The following report is an over-read performed by radiologist Dr. Franky Crease of [facility] on 04/24/2018. This over-read does not include interpretation of cardiac or coronary anatomy or pathology. The coronary CTA interpretation by the cardiologist is attached.  COMPARISON:  09/02/2012  FINDINGS: Vascular:  Heart is normal size.  Visualized aorta is normal caliber.  Mediastinum/Nodes: No adenopathy in the lower mediastinum or hila.  Lungs/Pleura: Dependent atelectasis in the lower lobes. No effusions.  Upper Abdomen: 2.3 cm low-density area within the left hepatic lobe is stable since prior study and likely reflects cyst.  Musculoskeletal: Chest wall soft tissues are unremarkable. 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 No acute bony abnormality.  IMPRESSION: No acute extra cardiac abnormality.  Electronically Signed: By: Franky Crease M.D. On: 04/24/2018 09:46   CT SCANS  CT CORONARY MORPH W/CTA COR W/SCORE 02/15/2012  Narrative *RADIOLOGY REPORT*  INDICATION:  53 year old male with history of chest pain during exercise.  CT ANGIOGRAPHY OF THE HEART, CORONARY ARTERY, STRUCTURE, AND MORPHOLOGY  CONTRAST: 85 mL OMNIPAQUE  IOHEXOL  350 MG/ML SOLN  COMPARISON:  None  TECHNIQUE:  CT angiography of the coronary vessels was performed on a 256 channel system using prospective ECG gating.  A scout and noncontrast exam (for calcium  scoring) were performed.  Circulation time was measured using a test bolus.  Coronary CTA was performed with sub mm slice collimation during portions of the cardiac cycle after prior injection of iodinated contrast.  Imaging post processing was performed on an independent workstation creating multiplanar and 3-D images, and quantitative analysis of the heart and coronary arteries.  Note that this exam targets the  heart and the chest was not imaged in its entirety.  COMMENT: During the initial attempt to acquire images, the scanner malfunctioned and failed to appropriately trigger.  Because of this, the patient was administered a second bolus of 85 ml of Omnipaque  350.  The patient was counseled to stay well hydrated for the remainder of the day, and the nurse was instructed to administer a 1 liter normal saline bolus following the examination.  PREMEDICATION: Lopressor  100 mg, P.O. Lopressor  10 mg, IV Nitroglycerin  400 mcg, sublingual.  FINDINGS: Technical quality:  Excellent  Heart rate:  60 - 65  CORONARY ARTERIES: Left main coronary artery:  Negative Left anterior descending:  Negative for atherosclerosis.  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 Left circumflex:  Negative Right coronary artery:  Negative Posterior descending artery:  Negative Dominance:  Right  CORONARY CALCIUM :  Total Agatston Score:  0  AORTA AND PULMONARY MEASUREMENTS: Aortic root (21 - 40 mm): 27 mm  at the annulus 36 mm  at the sinuses of Valsalva 27 mm  at the sinotubular junction Ascending aorta ( <  40 mm):  33 mm Descending aorta ( <  40 mm):  23 mm Main pulmonary artery:  ( <  30 mm):  29 mm  EXTRACARDIAC FINDINGS: 1.6 x 2.0 cm low attenuation lesion in segment 2 of the liver is favored to represent a small cyst. No consolidative airspace disease or pleural effusions in the visualized portions of the thorax. There are no aggressive appearing lytic or blastic lesions noted in the visualized portions of the skeleton.  IMPRESSION: 1. No  evidence of significant coronary artery disease.  Patient's coronary artery calcium  score is zero. 2.  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  A short portion of  the mid LAD extends into an intracavitary position within the right ventricle. This finding is benign, and typically of no clinical significance.  One caveat is if the patient should ever require pacemaker lead placement within the right ventricle, the position of the marked intracavitary portion of the mid LAD should be noted. 3.  No acute findings in the visualized thorax to account for the patient's symptoms. 4.  Right coronary artery dominance.  Report was called to CDU mid level at [phone] at 10:30 a.m. on 02/15/2012.  Original Report Authenticated By: TORIBIO MICAEL AYE, M.D.     ______________________________________________________________________________________________      EKG:   EKG Interpretation Date/Time:  Thursday September 03 2024 15:23:58 EDT Ventricular Rate:  77 PR Interval:  158 QRS Duration:  104 QT Interval:  376 QTC Calculation: 425 R Axis:   48  Text Interpretation: Normal sinus rhythm Normal ECG Confirmed by Randall Reed [phone]) on 09/03/2024 3:48:03 PM    Recent Labs: 03/18/2024: Magnesium  2.2; NT-Pro BNP <36 05/01/2024: ALT 19; TSH 1.33 06/03/2024: Hemoglobin 16.8; Platelets 197.0 09/03/2024: BUN 11; Creatinine, Ser 1.23; Potassium 4.2; Sodium 138  Recent Lipid Panel    Component Value Date/Time   CHOL 120 05/01/2024 0909   CHOL 102 04/09/2023 0815   TRIG 121.0 05/01/2024 0909   HDL 36.30 (L) 05/01/2024 0909   HDL 34 (L) 04/09/2023 0815   CHOLHDL 3 05/01/2024 0909   VLDL 24.2 05/01/2024 0909   LDLCALC 59 05/01/2024 0909   LDLCALC 46 04/09/2023 0815     Risk Assessment/Calculations:                Physical Exam:    VS:  There were no vitals taken for this visit.    Wt  Readings from Last 3 Encounters:  09/03/24 267 lb (121.1 kg)  07/10/24 268 lb (121.6 kg)  06/03/24 268 lb 3.2 oz (121.7 kg)     GEN:  Well nourished, well developed in no acute distress HEENT: Normal NECK: No JVD; No carotid bruits LYMPHATICS: No lymphadenopathy CARDIAC: Heart RRR, no murmurs, rubs, gallops RESPIRATORY:  Clear to auscultation without rales, wheezing or rhonchi  ABDOMEN: Soft, non-tender, non-distended MUSCULOSKELETAL:  No edema; No deformity  SKIN: Warm and dry NEUROLOGIC:  Alert and oriented x 3 PSYCHIATRIC:  Normal affect   Assessment & Plan Essential hypertension Blood pressure is now well-controlled on valsartan  and metoprolol  succinate. Mixed hyperlipidemia Treated with rosuvastatin .  Lipids have been excellent with an LDL of 59 Coronary artery disease involving native coronary artery of native heart with other form of angina pectoris Patient with nonobstructive CAD.  CTA from last year reviewed and showed stable nonobstructive coronary plaquing.  Treated appropriately with aspirin  and a statin drug. Tachycardia Recommend 3-day ZIO monitor.  I asked the patient to exercise while he is wearing his monitor so I can evaluate his heart rate response to exercise. Chest pain of uncertain etiology Patient has chronic pressure in his chest.  I do not think this is angina.  With his mildly reduced LVEF, I am going to order an echocardiogram to assess for any change.      Medication Adjustments/Labs and Tests Ordered: Current medicines are reviewed at length with the patient today.  Concerns regarding medicines are outlined above.  Orders Placed This Encounter  Procedures   LONG TERM MONITOR (3-14 DAYS)   EKG 12-Lead   ECHOCARDIOGRAM COMPLETE   Meds ordered this encounter  Medications   rosuvastatin  (CRESTOR ) 10 MG tablet  Sig: Take 1 tablet (10 mg total) by mouth daily.    Dispense:  90 tablet    Refill:  3   valsartan  (DIOVAN ) 80 MG tablet    Sig: Take 1  tablet (80 mg total) by mouth daily.    Dispense:  90 tablet    Refill:  3   metoprolol  succinate (TOPROL -XL) 50 MG 24 hr tablet    Sig: Take 1 tablet (50 mg total) by mouth daily. Take with or immediately following a meal.    Dispense:  90 tablet    Refill:  3    Dose DECREASE   furosemide  (LASIX ) 20 MG tablet    Sig: Take 1 tablet (20 mg total) by mouth daily. As needed for edema    Dispense:  90 tablet    Refill:  3    Patient Instructions  Medication Instructions:  No medication changes were made at this visit. Continue current regimen.   *If you need a refill on your cardiac medications before your next appointment, please call your pharmacy*  Lab Work: None ordered today. If you have labs (blood work) drawn today and your tests are completely normal, you will receive your results only by: MyChart Message (if you have MyChart) OR A paper copy in the mail If you have any lab test that is abnormal or we need to change your treatment, we will call you to review the results.  Testing/Procedures: Your physician has requested that you wear a Zio heart monitor for 3 days. This will be mailed to your home with instructions on how to apply the monitor and how to return it when finished. Please allow 2 weeks after returning the heart monitor before our office calls you with the results.   Your physician has requested that you have an echocardiogram. Echocardiography is a painless test that uses sound waves to create images of your heart. It provides your doctor with information about the size and shape of your heart and how well your heart's chambers and valves are working. This procedure takes approximately one hour. There are no restrictions for this procedure. Please do NOT wear cologne, perfume, aftershave, or lotions (deodorant is allowed). Please arrive 15 minutes prior to your appointment time.  Please note: We ask at that you not bring children with you during ultrasound (echo/  vascular) testing. Due to room size and safety concerns, children are not allowed in the ultrasound rooms during exams. Our front office staff cannot provide observation of children in our lobby area while testing is being conducted. An adult accompanying a patient to their appointment will only be allowed in the ultrasound room at the discretion of the ultrasound technician under special circumstances. We apologize for any inconvenience.   Follow-Up: At [facility], you and your health needs are our priority.  As part of our continuing mission to provide you with exceptional heart care, our providers are all part of one team.  This team includes your primary Cardiologist (physician) and Advanced Practice Providers or APPs (Physician Assistants and Nurse Practitioners) who all work together to provide you with the care you need, when you need it.  Your next appointment:   1 year(s)  Provider:   Ozell Fell, MD    We recommend signing up for the patient portal called MyChart.  Sign up information is provided on this After Visit Summary.  MyChart is used to connect with patients for Virtual Visits (Telemedicine).  Patients are able to view lab/test results,  encounter notes, upcoming appointments, etc.  Non-urgent messages can be sent to your provider as well.   To learn more about what you can do with MyChart, go to [email or web address].   Other Instructions ZIO XT- Long Term Monitor Instructions  Your physician has requested you wear a ZIO patch monitor for 3 days.   This is a single patch monitor. [employer] supplies one patch monitor per enrollment. Additional  stickers are not available. Please do not apply patch if you will be having a Nuclear Stress Test,  Echocardiogram, Cardiac CT, MRI, or Chest Xray during the period you would be wearing the  monitor. The patch cannot be worn during these tests. You cannot remove and re-apply the  ZIO XT patch monitor.   Your ZIO patch  monitor will be mailed 3 day USPS to your address on file. It may take 3-5 days  to receive your monitor after you have been enrolled.   Once you have received your monitor, please review the enclosed instructions. Your monitor  has already been registered assigning a specific monitor serial # to you.     Billing and Patient Assistance Program Information  We have supplied [employer] with any of your insurance information on file for billing purposes.  [employer] offers a sliding scale Patient Assistance Program for patients that do not have  insurance, or whose insurance does not completely cover the cost of the ZIO monitor.  You must apply for the Patient Assistance Program to qualify for this discounted rate.   To apply, please call [employer] at [phone], select option 4, select option 2, ask to apply for  Patient Assistance Program. Meredeth will ask your household income, and how many people  are in your household. They will quote your out-of-pocket cost based on that information.  [employer] will also be able to set up a 17-month, interest-free payment plan if needed.     Applying the monitor  Shave hair from upper left chest.  Hold abrader disc by orange tab. Rub abrader in 40 strokes over the upper left chest as  indicated in your monitor instructions.  Clean area with 4 enclosed alcohol pads. Let dry.  Apply patch as indicated in monitor instructions. Patch will be placed under collarbone on left  side of chest with arrow pointing upward.  Rub patch adhesive wings for 2 minutes. Remove white label marked 1. Remove the white  label marked 2. Rub patch adhesive wings for 2 additional minutes.  While looking in a mirror, press and release button in center of patch. A small green light will  flash 3-4 times. This will be your only indicator that the monitor has been turned on.  Do not shower for the first 24 hours. You may shower after the first 24 hours.  Press the button if you feel a  symptom. You will hear a small click. Record Date, Time and  Symptom in the Patient Logbook.  When you are ready to remove the patch, follow instructions on the last 2 pages of Patient  Logbook. Stick patch monitor onto the last page of Patient Logbook.   Place Patient Logbook in the blue and white box. Use locking tab on box and tape box closed  securely. The blue and white box has prepaid postage on it. Please place it in the mailbox as  soon as possible. Your physician should have your test results approximately 7 days after the  monitor has been mailed back to [facility].   Call [facility]  Customer Care at [phone] if you have questions regarding  your ZIO XT patch monitor. Call them immediately if you see an orange light blinking on your  monitor.   If your monitor falls off in less than 4 days, contact our Monitor department at [phone].   If your monitor becomes loose or falls off after 4 days call [employer] at [phone] for  suggestions on securing your monitor.       Signed, Ozell Fell, MD  09/03/2024 4:05 PM    Randall Reed

## 2024-09-03 NOTE — Progress Notes (Unsigned)
 Enrolled patient for a 3 day Zio XT monitor to be mailed to patients home

## 2024-09-03 NOTE — Patient Instructions (Addendum)
 For heartburn, I recommend discussing additional options with your gastroenterologist as they may want to try an H2 blocker like Pepcid instead of taking the Protonix  along with Voquenza.  Try to avoid late-night meals.  For weight loss options I would recommend meeting with weight management specialist.  Here is 1 option I recommend locally: [facility]  [phone]  Thank you for coming in today. No other change in medications at this time. If there are any concerns on your bloodwork, I will let you know. Take care!

## 2024-09-03 NOTE — Progress Notes (Signed)
 Subjective:  Patient ID: [name]., male    DOB: [birth year]  Age: 53 y.o. MRN: [id number]  CC:  Chief Complaint  Patient presents with   Follow-up    3 month follow up. No questions or concerns.     HPI Ebin Palazzi. presents for 55-month follow-up.  Chronic conditions were discussed in June, last visit in July for iron deficiency.  Iron deficiency, B12 deficiency: Last discussed in July.  On B12 and iron supplementation at that time.  Had follow-up with GI and general surgery.  Previous fatigue thought to be due to meds, improved after stopping Entresto , followed by cardiology.  Colonoscopy in September.  Dr. Leigh.  Recall colonoscopy in 10 years for screening.  Benign polyps.  Recommended CBC, TIBC, ferritin panel in 3 months.  Recent CT which was also reassuring for any cause for low iron.  Endoscopy without significant concerns or cause for low iron.  Changed to Voquenza for acid suppression.  Plan for continued monitoring of iron and as long as improving no further evaluation.  If deficiency persists with supplementation, possible capsule endoscopy.  Has been taking voquenza daily.  Initially worked well, then some breakthrough heartburn at night past few weeks. Eating later. 3-4 days per week. Has been taking protonix  at night as well with benefit.  No BRBPR, no emesis.  Taking iron and B12 supplements daily. No constipation. On fiber supplement.   Lab Results  Component Value Date   IRON 53 06/03/2024   TIBC 422 06/03/2024   FERRITIN 9 (L) 06/03/2024   Lab Results  Component Value Date   VITAMINB12 1,483 (H) 06/03/2024   Prediabetes: Slight elevated A1c in June, diet/exercise approach with weight improved by 1 pound since that time.  Lab Results  Component Value Date   HGBA1C 5.8 05/01/2024   Wt Readings from Last 3 Encounters:  09/03/24 267 lb (121.1 kg)  07/10/24 268 lb (121.6 kg)  06/03/24 268 lb 3.2 oz (121.7 kg)   Vitamin D  deficiency Has  continued on supplementation, vitamin D  50,000 units weekly.  He has noticed fatigue, symptoms when stopping medication. Last dose yesterday.  Last vitamin D  Lab Results  Component Value Date   VD25OH 66.94 05/01/2024   HIV preexposure prophylaxis On truvada  and has doxypep. 2 new partners since last testing. Has used doxypep with every occurrence. No penile d/c, rashes or STI symptoms. No side effects.   Followed by urology for testosterone , considering following there and here only instead of continuing with endocrine.   Obesity: Considering options. Will be seeing cardiology today to discuss options.   Wt Readings from Last 3 Encounters:  09/03/24 267 lb (121.1 kg)  07/10/24 268 lb (121.6 kg)  06/03/24 268 lb 3.2 oz (121.7 kg)  Body mass index is 38.31 kg/m.   HM: Flu vaccine today.  Plans on covid booster at pharmacy.    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   Fatigue 03/12/2021   Change in bowel habit 10/03/2020   Early satiety 10/03/2020   History of colonic polyps 10/03/2020   Screening for malignant neoplasm of colon 10/03/2020   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   Diarrhea 10/03/2020   Voice disturbance 03/12/2019   Coronary artery disease involving native coronary artery of native heart without angina pectoris 03/05/2019   Irritable bowel syndrome 03/14/2016   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   Asthma 11/19/2015   Sleep apnea 11/19/2015   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   rotator cuff tear 10/17/2013    Hypogonadism male 09/04/2013   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   ED (erectile dysfunction) 07/30/2013   Male erectile dysfunction, unspecified 07/30/2013   Biceps tendonitis 06/15/2013   Incomplete tear of rotator cuff 06/15/2013   Pain in upper limb 06/15/2013   Bilateral knee pain 11/07/2012   Right shoulder pain 11/07/2012   Palpitations 02/21/2012   BMI 33.0-33.9,adult 02/04/2012   Allergic rhinitis 02/04/2012   GERD (gastroesophageal reflux disease) 02/04/2012   Migraine 02/04/2012   GAD (generalized anxiety disorder) 02/04/2012   Insomnia 02/04/2012   Obesity 02/04/2012   Gastro-esophageal reflux disease without esophagitis 02/04/2012   Essential hypertension 04/13/2009   CHEST PAIN-UNSPECIFIED 04/13/2009   Abdominal pain 04/13/2009   Past Medical History:  Diagnosis Date   Allergy    Anemia    Anxiety    Arthritis    Asthma    CAD (coronary artery disease) 03/04/2024   CHF (congestive heart failure) (HCC) 03/04/2024   EF 50%   Complication of anesthesia    oxygen levelds dropped  with colonoscopy   Depression    Dyspnea    sometimes with asthma has sob   GERD (gastroesophageal reflux disease)    Headache    Hyperlipidemia    Phreesia 12/05/2020   Hypertension    Pneumonia    Seasonal allergic reaction    Sleep apnea    Past Surgical History:  Procedure Laterality Date   CIRCUMCISION  10/2022   COLON SURGERY  10/11/2023   COLONOSCOPY  07/17/2022   FLEXIBLE SIGMOIDOSCOPY N/A 10/11/2023   Procedure: FLEXIBLE SIGMOIDOSCOPY;  Surgeon: Teresa Lonni HERO, MD;  Location: [facility];  Service: General;  Laterality: N/A;   LIPOMA EXCISION N/A 10/30/2018   Procedure: EXCISION OF MULTIPLE SUBCUTANEOUS LIPOMAS ON TORSO;  Surgeon: Belinda Cough, MD;  Location: [facility];  Service: General;  Laterality: N/A;   UPPER GASTROINTESTINAL ENDOSCOPY  07/17/2022   XI ROBOTIC ASSISTED LOWER ANTERIOR RESECTION N/A 10/11/2023   Procedure: XI ROBOTIC  ASSISTED LOWER ANTERIOR RESECTION, SIGMOIDECTOMY WITH INTRAOPERATIVE ASSESSMENT OF PERFUSION ICG, BILATERAL TAP BLOCK;  Surgeon: Teresa Lonni HERO, MD;  Location: [facility];  Service: General;  Laterality: N/A;  180   Allergies  Allergen Reactions   Desloratadine Other (See Comments)    CLARINEX-severe headache CLARITIN- severe headache   Loratadine Other (See Comments)    severe headache   Ketek [Telithromycin] Itching and Rash   Levbid  [Hyoscyamine  Sulfate] Rash   Prior to Admission medications   Medication Sig Start Date End Date Taking? Authorizing Provider  AIRSUPRA  90-80 MCG/ACT AERO Inhale 2 puffs into the lungs every 4 (four) hours as needed. 03/30/23  Yes [provider]  albuterol  (PROVENTIL ) (2.5 MG/3ML) 0.083% nebulizer solution  02/27/24  Yes [provider]  albuterol  (VENTOLIN  HFA) 108 (90 Base) MCG/ACT inhaler INHALE 2 PUFFS INTO THE LUNGS EVERY 6 HOURS AS NEEDED FOR WHEEZING OR SHORTNESS OF BREATH 07/19/20  Yes Levora Reyes SAUNDERS, MD  ALLEGRA ALLERGY 180 MG tablet Take 180 mg by mouth daily. 07/12/20  Yes [provider]  ALPRAZolam  (XANAX ) 1 MG tablet TAKE 1 TABLET BY  MOUTH TWICE DAILY AS NEEDED 08/27/24  Yes Levora Reyes SAUNDERS, MD  anastrozole (ARIMIDEX) 1 MG tablet Take 0.5 mg by mouth once a week.   Yes [provider]  aspirin  EC 81 MG tablet Take 1 tablet (81 mg total) by mouth daily. 03/07/15  Yes Wonda Sharper, MD  Budeson-Glycopyrrol-Formoterol  (BREZTRI AEROSPHERE) 160-9-4.8 MCG/ACT AERO Inhale 2 puffs into the lungs in the morning and at bedtime. 09/14/21  Yes [provider]  budesonide  (PULMICORT ) 0.5 MG/2ML nebulizer solution 2 (two) times daily. 06/29/24  Yes [provider]  clotrimazole  (MYCELEX ) 10 MG troche SMARTSIG:1 Lozenge(s) By Mouth 5 Times Daily 02/27/24  Yes [provider]  cyanocobalamin  (VITAMIN B12) 1000 MCG tablet Take 1 tablet (1,000 mcg total) by mouth daily. 05/04/24  Yes Levora Reyes SAUNDERS, MD  dicyclomine  (BENTYL ) 20 MG tablet Take 1 tablet (20 mg total) by mouth in the morning and at bedtime. 05/25/24  Yes [place], PA-C  doxycycline  (VIBRA -TABS) 100 MG tablet Take 200 mg by mouth daily as needed. 06/25/24  Yes [provider]  DUPIXENT 300 MG/2ML prefilled syringe Inject 300 mg into the skin every 14 (fourteen) days. 10/06/21  Yes [provider]  emtricitabine -tenofovir  (TRUVADA ) 200-300 MG tablet TAKE 1 TABLET BY MOUTH DAILY 04/30/24  Yes Levora Reyes SAUNDERS, MD  famotidine (PEPCID) 40 MG tablet Take 40 mg by mouth daily as needed for heartburn or indigestion. 02/18/20  Yes [provider]  ferrous sulfate  (FEROSUL) 325 (65 FE) MG tablet Take 1 tablet (325 mg total) by mouth daily with breakfast. 05/04/24  Yes Levora Reyes SAUNDERS, MD  furosemide  (LASIX ) 20 MG tablet Take 1 tablet (20 mg total) by mouth daily. As needed for edema 02/03/24  Yes Wonda Sharper, MD  ipratropium (ATROVENT ) 0.06 % nasal spray Place 2 sprays into the nose 2 (two) times daily. 05/25/21  Yes Levora Reyes SAUNDERS, MD  ipratropium-albuterol  (DUONEB) 0.5-2.5 (3) MG/3ML SOLN Take 3 mLs by nebulization every 6 (six) hours as needed (Shortness of breath or wheezing). 12/25/21  Yes [provider]  metoprolol  succinate (TOPROL -XL) 50 MG 24 hr tablet Take 1 tablet (50 mg total) by mouth daily. Take with or immediately following a meal. 04/02/24  Yes Wonda Sharper, MD  mirabegron ER (MYRBETRIQ) 25 MG TB24 tablet Take 25 mg by mouth daily as needed (bladder spasm).   Yes [provider]  montelukast  (SINGULAIR ) 10 MG tablet TAKE 1 TABLET(10 MG) BY MOUTH AT BEDTIME 03/23/24  Yes Levora Reyes SAUNDERS, MD  olopatadine  (PATADAY ) 0.1 % ophthalmic solution Place 1 drop into both eyes 2 (two) times daily as needed for allergies. 01/17/24  Yes Jerrell Cleatus Ned, MD  Olopatadine -Mometasone  ([name]) 6206349806 MCG/ACT SUSP Place 1 spray into the nose in the morning and at bedtime.    Yes [provider]  ondansetron  (ZOFRAN -ODT) 4 MG disintegrating tablet Take 1 tablet (4 mg total) by mouth every 8 (eight) hours as needed for nausea or vomiting. 05/25/24  Yes [place], PA-C  pantoprazole  (PROTONIX ) 40 MG tablet Take 1 tablet (40 mg total) by mouth daily. 05/01/24  Yes Levora Reyes SAUNDERS, MD  Potassium Chloride  ER 20 MEQ TBCR TAKE 1 TABLET(20 MEQ) BY MOUTH DAILY 07/01/24  Yes Wonda Sharper, MD  pramoxine-hydrocortisone  (PROCTOCREAM-HC) 1-1 % rectal cream Place rectally 3 (three) times daily. 05/07/22  Yes Levora Reyes SAUNDERS, MD  rizatriptan  (MAXALT -MLT) 10 MG disintegrating tablet DISSOLVE 1 TABLET IN MOUTH AS NEEDED FOR HEADACHE. REPEAT  IN  2  HOURS  AS  NEEDED. 08/31/24  Yes Levora Reyes SAUNDERS, MD  rosuvastatin  (CRESTOR ) 10 MG tablet TAKE 1 TABLET(10 MG) BY MOUTH DAILY 07/03/24  Yes Wonda Sharper, MD  sildenafil  (VIAGRA ) 100 MG tablet Take 1/2-1 tablet by mouth once daily as needed prior to sexual activity *MUST KEEP UPCOMING APPT* 04/02/23  Yes Wonda Sharper, MD  Spacer/Aero-Holding Chambers DEVI Use with inhaler 05/15/22  Yes Icard, Adine CROME, DO  tadalafil  (CIALIS ) 5 MG tablet Take 5 mg by mouth daily. 02/01/23  Yes [provider]  tamsulosin  (FLOMAX ) 0.4 MG CAPS capsule TAKE 1 CAPSULE(0.4 MG) BY MOUTH DAILY AS NEEDED 06/22/24  Yes Levora Reyes SAUNDERS, MD  testosterone  cypionate (DEPOTESTOSTERONE CYPIONATE) 200 MG/ML injection Inject 100 mg into the muscle once a week. 07/07/22  Yes [provider]  valsartan  (DIOVAN ) 80 MG tablet Take 1 tablet (80 mg total) by mouth daily. 06/15/24  Yes Wonda Sharper, MD  Vitamin D , Ergocalciferol , (DRISDOL ) 1.25 MG (50000 UNIT) CAPS capsule TAKE ONE CAPSULE BY MOUTH EVERY 7 DAYS 08/13/24  Yes Levora Reyes SAUNDERS, MD  zolpidem  (AMBIEN ) 10 MG tablet TAKE 1 TABLET(10 MG) BY MOUTH AT BEDTIME 07/21/24  Yes Levora Reyes SAUNDERS, MD  celecoxib (CELEBREX) 200 MG capsule Take 200 mg by mouth 2 (two) times daily as needed for moderate  pain (pain score 4-6). PRN Patient not taking: Reported on 09/03/2024 10/23/22   [provider]  Vonoprazan Fumarate  (VOQUEZNA ) 10 MG TABS Take 1 tablet by mouth daily. 07/14/24   Armbruster, Elspeth SQUIBB, MD  dicyclomine  (BENTYL ) 20 MG tablet Take 20 mg by mouth 3 (three) times daily before meals.    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)   Occupation: [employer]  Tobacco Use   Smoking status: Never   Smokeless tobacco: Never  Vaping Use   Vaping status: Never Used  Substance and Sexual Activity   Alcohol use: Not Currently    Alcohol/week: 3.0 standard drinks of alcohol    Types: 3 Standard drinks or equivalent per week    Comment: social-monthly   Drug use: Never   Sexual activity: Yes  Other Topics Concern   Not on file  Social History Narrative   Not on file   Social Drivers of Health   Financial Resource Strain: Low Risk  (09/01/2024)   Overall Financial Resource Strain (CARDIA)    Difficulty of Paying Living Expenses: Not very hard  Food Insecurity: No Food Insecurity (09/01/2024)   Hunger Vital Sign    Worried About Running Out of Food in the Last Year: Never true    Ran Out of Food in the Last Year: Never true  Transportation Needs: No Transportation Needs (09/01/2024)   PRAPARE - Administrator, Civil Service (Medical): No    Lack of Transportation (Non-Medical): No  Physical Activity: Sufficiently Active (09/01/2024)   Exercise Vital Sign    Days of Exercise per Week: 3 days    Minutes of Exercise per Session: 120 min  Stress: Stress Concern Present (09/01/2024)   [employer] of Occupational Health - Occupational Stress Questionnaire     Feeling of Stress: To some extent  Social Connections: Moderately Integrated (09/01/2024)  Social Connection and Isolation Panel    Frequency of Communication with Friends and Family: More than three times a week    Frequency of Social Gatherings with Friends and Family: Once a week    Attends Religious Services: More than 4 times per year    Active Member of [employer] or Organizations: Yes    Attends Engineer, Structural: More than 4 times per year    Marital Status: Never married  Intimate Partner Violence: Not At Risk (10/15/2023)   Humiliation, Afraid, Rape, and Kick questionnaire    Fear of Current or Ex-Partner: No    Emotionally Abused: No    Physically Abused: No    Sexually Abused: No    Review of Systems   Objective:   Vitals:   09/03/24 0802  BP: 100/60  Pulse: 82  Resp: 17  Temp: 98 F (36.7 C)  TempSrc: Temporal  SpO2: 95%  Weight: 267 lb (121.1 kg)  Height: 5' 10 (1.778 m)     Physical Exam Vitals reviewed.  Constitutional:      Appearance: He is well-developed.  HENT:     Head: Normocephalic and atraumatic.  Neck:     Vascular: No carotid bruit or JVD.  Cardiovascular:     Rate and Rhythm: Normal rate and regular rhythm.     Heart sounds: Normal heart sounds. No murmur heard. Pulmonary:     Effort: Pulmonary effort is normal.     Breath sounds: Normal breath sounds. No rales.  Musculoskeletal:     Right lower leg: No edema.     Left lower leg: No edema.  Skin:    General: Skin is warm and dry.  Neurological:     Mental Status: He is alert and oriented to person, place, and time.  Psychiatric:        Mood and Affect: Mood normal.        Assessment & Plan:  Kaveon Blatz. is a 53 y.o. male . Iron deficiency - Plan: Iron, TIBC and Ferritin Panel  - Reassuring evaluation through GI including his endoscopy and colonoscopy.  Check updated iron levels is improving we will continue to monitor.  Possible capsule endoscopy as  above if not improving with supplementation, could also consider hematology eval.  - Initially improved heartburn on Voquenza, some breakthrough symptoms as above, recommend he discuss with GI as they may want him to try H2 blocker in place of PPI Protonix .  Late-night meals may be impacting symptom control, advised to try to avoid late-night eating.  Need for influenza vaccination - Plan: Flu vaccine trivalent PF, 6mos and older(Flulaval,Afluria,Fluarix,Fluzone)  B12 deficiency - Plan: B12  - Check levels, continue supplementation.  Low vitamin D  level - Plan: Vitamin D  (25 hydroxy)  - Stable with supplementation previously, check levels and adjust plan accordingly.  Elevated hemoglobin A1c - Plan: Hemoglobin A1c  - Prediabetes discussed, slight increase in A1c from prior readings on last check.  Check updated labs and adjust plan accordingly.  Phone number provided for weight management but he plans to discuss options with cardiology as well.  Option of separate visit to discuss meds but again I did recommend weight management specialist may be best fit.  On pre-exposure prophylaxis for HIV - Plan: RPR, HIV Antibody (routine testing w rflx), Urine cytology ancillary only Routine screening for STI (sexually transmitted infection) - Plan: RPR, HIV Antibody (routine testing w rflx), Urine cytology ancillary only  - Tolerating Truvada , check labs, continue same  dose and Doxy PEP as needed for postexposure prophylaxis.  No orders of the defined types were placed in this encounter.  Patient Instructions  For heartburn, I recommend discussing additional options with your gastroenterologist as they may want to try an H2 blocker like Pepcid instead of taking the Protonix  along with Voquenza.  Try to avoid late-night meals.  For weight loss options I would recommend meeting with weight management specialist.  Here is 1 option I recommend locally: [facility]  [phone]  Thank you for coming in today. No other change in medications at this time. If there are any concerns on your bloodwork, I will let you know. Take care!        Signed,   Reyes Pines, MD  Primary Care, [facility] 09/03/24 8:42 AM

## 2024-09-03 NOTE — Telephone Encounter (Signed)
 Pharmacy Patient Advocate Encounter   Received notification from RX Request Messages that prior authorization for Rizatriptan  10mg  dispersable tablets is required/requested.   Insurance verification completed.   The patient is insured through [facility].   Per test claim, medication is not covered due to plan/benefit exclusion, PA not submitted at this time   -PA was cancelled by the insurance.

## 2024-09-04 LAB — HIV ANTIBODY (ROUTINE TESTING W REFLEX)
HIV 1&2 Ab, 4th Generation: NONREACTIVE
HIV FINAL INTERPRETATION: NEGATIVE

## 2024-09-04 LAB — URINE CYTOLOGY ANCILLARY ONLY
Chlamydia: NEGATIVE
Comment: NEGATIVE
Comment: NEGATIVE
Comment: NORMAL
Neisseria Gonorrhea: NEGATIVE
Trichomonas: NEGATIVE

## 2024-09-04 LAB — IRON,TIBC AND FERRITIN PANEL
%SAT: 9 % — ABNORMAL LOW (ref 20–48)
Ferritin: 7 ng/mL — ABNORMAL LOW (ref 38–380)
Iron: 36 ug/dL — ABNORMAL LOW (ref 50–180)
TIBC: 379 ug/dL (ref 250–425)

## 2024-09-04 LAB — RPR: RPR Ser Ql: NONREACTIVE

## 2024-09-08 ENCOUNTER — Ambulatory Visit: Payer: Self-pay | Admitting: Family Medicine

## 2024-09-08 DIAGNOSIS — Z202 Contact with and (suspected) exposure to infections with a predominantly sexual mode of transmission: Secondary | ICD-10-CM

## 2024-09-09 ENCOUNTER — Encounter: Payer: Self-pay | Admitting: Cardiovascular Disease

## 2024-09-09 DIAGNOSIS — J301 Allergic rhinitis due to pollen: Secondary | ICD-10-CM | POA: Diagnosis not present

## 2024-09-09 DIAGNOSIS — J3089 Other allergic rhinitis: Secondary | ICD-10-CM | POA: Diagnosis not present

## 2024-09-10 NOTE — Telephone Encounter (Signed)
 Please see message below

## 2024-09-11 ENCOUNTER — Encounter: Payer: Self-pay | Admitting: Family

## 2024-09-11 ENCOUNTER — Ambulatory Visit: Admitting: Family

## 2024-09-11 ENCOUNTER — Other Ambulatory Visit (HOSPITAL_COMMUNITY)
Admission: RE | Admit: 2024-09-11 | Discharge: 2024-09-11 | Disposition: A | Discharge status: Home / Self Care | Source: Ambulatory Visit | Attending: Family | Admitting: Family

## 2024-09-11 ENCOUNTER — Ambulatory Visit: Payer: Self-pay

## 2024-09-11 VITALS — BP 111/71 | HR 88 | Temp 98.4°F | Ht 71.0 in | Wt 264.2 lb

## 2024-09-11 DIAGNOSIS — K146 Glossodynia: Secondary | ICD-10-CM | POA: Diagnosis not present

## 2024-09-11 DIAGNOSIS — Z113 Encounter for screening for infections with a predominantly sexual mode of transmission: Secondary | ICD-10-CM | POA: Insufficient documentation

## 2024-09-11 DIAGNOSIS — K148 Other diseases of tongue: Secondary | ICD-10-CM

## 2024-09-11 MED ORDER — VALACYCLOVIR HCL 1 G PO TABS: 1000.0000 mg | ORAL_TABLET | Freq: Two times a day (BID) | ORAL | 0 refills | Status: AC

## 2024-09-11 NOTE — Telephone Encounter (Signed)
 FYI Only or Action Required?: FYI only for provider: appointment scheduled on 11/7.  Patient was last seen in primary care on 09/03/2024 by Levora Reyes SAUNDERS, MD.  Called Nurse Triage reporting Exposure to STD.  Symptoms began several days ago.  Interventions attempted: Prescription medications: doxycycline  3 days.  Symptoms are: gradually worsening.  Triage Disposition: See PCP When Office is Open (Within 3 Days)  Patient/caregiver understands and will follow disposition?: Yes  Copied from CRM [id number]. Topic: Clinical - Red Word Triage >> Sep 11, 2024 10:54 AM Thersia BROCKS wrote: [facility] that prompted transfer to Nurse Triage: Patient called in stated he has a bump on tongue that is started to swell, would like for someone to look at to see what he back need to do for it    ----------------------------------------------------------------------- From previous Reason for Contact - Scheduling: Patient/patient representative is calling to schedule an appointment. Refer to attachments for appointment information. Reason for Disposition  Patient is worried they have a sexually transmitted infection (STI)  Answer Assessment - Initial Assessment Questions Does not look like a canker sore. Denies pus. Tender. Raised red. Ceneter of the tongue. Some separation from the edge of it and the tongue Size between a black eyed pea and a pinto bean  Peroxide, perisol to clean area  Has prophylactic Doxycycline - took Monday Tuesday and weds as a precaution 1. MAIN CONCERN: What were you exposed to?  What sexually transmitted infection (STI) does your sex partner have? (e.g., gonorrhea, herpes, HIV, pubic lice)     Unprotected oral sex on Sunday .  2. ROUTE of EXPOSURE: How were you exposed to the STI? (e.g., oral, vaginal, or rectal intercourse)     Oral  3. DATE of EXPOSURE: When did the exposure occur? (e.g., days)     Sunday  4. SYMPTOMS: Do you have any symptoms? (e.g., pain with  urination, rash, sores)     Denies fever, chills Monday but the office was cold so unsure. Mild sore throat earlier in the week (post nasal drip irritation)  Protocols used: STI Exposure-A-AH

## 2024-09-11 NOTE — Telephone Encounter (Signed)
 Patient needs an appointment, PCP out of office so he should be seen with Dr Jerrell or Mahlon for assistance

## 2024-09-11 NOTE — Addendum Note (Signed)
 Addended by: NEYSA CLARIA SAILOR on: 09/11/2024 03:59 PM   Modules accepted: Orders

## 2024-09-11 NOTE — Progress Notes (Signed)
 Patient ID: Randall JONETTA Floria Mickey., male    DOB: 1970-11-16, 53 y.o.   MRN: [id number]  Chief Complaint  Patient presents with   Tongue pain    Pt c/o Tongue pain and redness after unprotected oral sex last Sunday and sx started Monday.    Headache    Pt c/o headaches,present for 2 days. Has tried Maxalt , which did not help sx.  Discussed the use of AI scribe software for clinical note transcription with the patient, who gave verbal consent to proceed.  History of Present Illness Randall Reed. Randall Reed is a 53 year old male who presents with a swollen and tender tongue.  Swollen bump and tenderness in the middle of his tongue have persisted since Monday, with increased redness noted in the morning. He has been using hydrogen peroxide and antifungal lozenges since Monday night. He uses a steroid inhaler, which he believes may have caused thrush in the past. He engaged in unprotected oral sex on Monday, after which the tongue symptoms appeared. He has taken doxycycline  post-exposure as a precaution. He recently underwent STD testing and takes Truvada  for HIV pre-exposure prophylaxis. He experiences a dull headache over the last few nights and had a sore throat earlier in the week, which has resolved. The tongue pain is described as a burning sensation when speaking, with no tingling or other oral lesions present.  Assessment & Plan Painful tongue lesion Acute lesion with increased redness and tenderness. Differential includes thrush, ulcer, or possible STI. Recent unprotected oral sex and doxycycline  use. Possible thrush due to steroid inhaler or oral yeast exposure with unprotected sex. No herpes appearance, but prophylactic antiviral considered. - Continue antifungal lozenges. - Continue to use peroxyl for antiseptic treatment. - Ordered urine test for STI screening, including gonorrhea and chlamydia. - Prescribed Valtrex  1g q12h x7d for potential herpes infection. - Advised if any  worsening of sx, opening of lesion with drainage, should come in to have swab tested   Subjective:    Outpatient Medications Prior to Visit  Medication Sig Dispense Refill   albuterol  (PROVENTIL ) (2.5 MG/3ML) 0.083% nebulizer solution      albuterol  (VENTOLIN  HFA) 108 (90 Base) MCG/ACT inhaler INHALE 2 PUFFS INTO THE LUNGS EVERY 6 HOURS AS NEEDED FOR WHEEZING OR SHORTNESS OF BREATH 8.5 g 0   ALLEGRA ALLERGY 180 MG tablet Take 180 mg by mouth daily.     AIRSUPRA  90-80 MCG/ACT AERO Inhale 2 puffs into the lungs every 4 (four) hours as needed. (Patient taking differently: Inhale 2 puffs into the lungs 2 (two) times daily.)     ALPRAZolam  (XANAX ) 1 MG tablet TAKE 1 TABLET BY MOUTH TWICE DAILY AS NEEDED 60 tablet 0   anastrozole (ARIMIDEX) 1 MG tablet Take 0.5 mg by mouth once a week.     aspirin  EC 81 MG tablet Take 1 tablet (81 mg total) by mouth daily.     Budeson-Glycopyrrol-Formoterol  (BREZTRI AEROSPHERE) 160-9-4.8 MCG/ACT AERO Inhale 2 puffs into the lungs in the morning and at bedtime.     budesonide  (PULMICORT ) 0.5 MG/2ML nebulizer solution 2 (two) times daily.     clotrimazole  (MYCELEX ) 10 MG troche SMARTSIG:1 Lozenge(s) By Mouth 5 Times Daily     cyanocobalamin  (VITAMIN B12) 1000 MCG tablet Take 1 tablet (1,000 mcg total) by mouth daily. 30 tablet 3   dicyclomine  (BENTYL ) 20 MG tablet Take 1 tablet (20 mg total) by mouth in the morning and at bedtime. 60 tablet 2   doxycycline  (VIBRA -TABS)  100 MG tablet Take 200 mg by mouth daily as needed.     DUPIXENT 300 MG/2ML prefilled syringe Inject 300 mg into the skin every 14 (fourteen) days.     emtricitabine -tenofovir  (TRUVADA ) 200-300 MG tablet TAKE 1 TABLET BY MOUTH DAILY 90 tablet 1   famotidine (PEPCID) 40 MG tablet Take 40 mg by mouth daily as needed for heartburn or indigestion.     ferrous sulfate  (FEROSUL) 325 (65 FE) MG tablet Take 1 tablet (325 mg total) by mouth daily with breakfast. 30 tablet 3   furosemide  (LASIX ) 20 MG tablet  Take 1 tablet (20 mg total) by mouth daily. As needed for edema 90 tablet 3   ipratropium (ATROVENT ) 0.06 % nasal spray Place 2 sprays into the nose 2 (two) times daily. 15 mL 11   ipratropium-albuterol  (DUONEB) 0.5-2.5 (3) MG/3ML SOLN Take 3 mLs by nebulization every 6 (six) hours as needed (Shortness of breath or wheezing).     metoprolol  succinate (TOPROL -XL) 50 MG 24 hr tablet Take 1 tablet (50 mg total) by mouth daily. Take with or immediately following a meal. 90 tablet 3   mirabegron ER (MYRBETRIQ) 25 MG TB24 tablet Take 25 mg by mouth daily as needed (bladder spasm).     montelukast  (SINGULAIR ) 10 MG tablet TAKE 1 TABLET(10 MG) BY MOUTH AT BEDTIME 90 tablet 3   olopatadine  (PATADAY ) 0.1 % ophthalmic solution Place 1 drop into both eyes 2 (two) times daily as needed for allergies. 5 mL 12   Olopatadine -Mometasone  ([name]) 665-25 MCG/ACT SUSP Place 1 spray into the nose in the morning and at bedtime.     ondansetron  (ZOFRAN -ODT) 4 MG disintegrating tablet Take 1 tablet (4 mg total) by mouth every 8 (eight) hours as needed for nausea or vomiting. 30 tablet 3   pantoprazole  (PROTONIX ) 40 MG tablet Take 1 tablet (40 mg total) by mouth daily. 180 tablet 1   Potassium Chloride  ER 20 MEQ TBCR TAKE 1 TABLET(20 MEQ) BY MOUTH DAILY 90 tablet 3   pramoxine-hydrocortisone  (PROCTOCREAM-HC) 1-1 % rectal cream Place rectally 3 (three) times daily. 30 g 1   rizatriptan  (MAXALT -MLT) 10 MG disintegrating tablet DISSOLVE 1 TABLET IN MOUTH AS NEEDED FOR HEADACHE. REPEAT  IN  2  HOURS  AS  NEEDED. 9 tablet 0   rosuvastatin  (CRESTOR ) 10 MG tablet Take 1 tablet (10 mg total) by mouth daily. 90 tablet 3   sildenafil  (VIAGRA ) 100 MG tablet Take 1/2-1 tablet by mouth once daily as needed prior to sexual activity *MUST KEEP UPCOMING APPT* 90 tablet 3   Spacer/Aero-Holding Chambers DEVI Use with inhaler 1 each 0   tadalafil  (CIALIS ) 5 MG tablet Take 5 mg by mouth daily.     tamsulosin  (FLOMAX ) 0.4 MG CAPS capsule TAKE  1 CAPSULE(0.4 MG) BY MOUTH DAILY AS NEEDED 90 capsule 0   testosterone  cypionate (DEPOTESTOSTERONE CYPIONATE) 200 MG/ML injection Inject 100 mg into the muscle once a week.     valsartan  (DIOVAN ) 80 MG tablet Take 1 tablet (80 mg total) by mouth daily. 90 tablet 3   Vitamin D , Ergocalciferol , (DRISDOL ) 1.25 MG (50000 UNIT) CAPS capsule TAKE ONE CAPSULE BY MOUTH EVERY 7 DAYS 15 capsule 1   Vonoprazan Fumarate  (VOQUEZNA ) 10 MG TABS Take 1 tablet by mouth daily. 90 tablet 3   zolpidem  (AMBIEN ) 10 MG tablet TAKE 1 TABLET(10 MG) BY MOUTH AT BEDTIME 90 tablet 0   No facility-administered medications prior to visit.   Past Medical History:  Diagnosis Date  Allergy    Anemia    Anxiety    Arthritis    Asthma    CAD (coronary artery disease) 03/04/2024   CHF (congestive heart failure) (HCC) 03/04/2024   EF 50%   Complication of anesthesia    oxygen levelds dropped  with colonoscopy   Depression    Dyspnea    sometimes with asthma has sob   GERD (gastroesophageal reflux disease)    Headache    Hyperlipidemia    [employer] 12/05/2020   Hypertension    Pneumonia    Seasonal allergic reaction    Sleep apnea    Past Surgical History:  Procedure Laterality Date   CIRCUMCISION  10/2022   COLON SURGERY  10/11/2023   COLONOSCOPY  07/17/2022   FLEXIBLE SIGMOIDOSCOPY N/A 10/11/2023   Procedure: FLEXIBLE SIGMOIDOSCOPY;  Surgeon: Teresa Lonni HERO, MD;  Location: [facility];  Service: General;  Laterality: N/A;   LIPOMA EXCISION N/A 10/30/2018   Procedure: EXCISION OF MULTIPLE SUBCUTANEOUS LIPOMAS ON TORSO;  Surgeon: Belinda Cough, MD;  Location: [facility];  Service: General;  Laterality: N/A;   UPPER GASTROINTESTINAL ENDOSCOPY  07/17/2022   XI ROBOTIC ASSISTED LOWER ANTERIOR RESECTION N/A 10/11/2023   Procedure: XI ROBOTIC ASSISTED LOWER ANTERIOR RESECTION, SIGMOIDECTOMY WITH INTRAOPERATIVE ASSESSMENT OF PERFUSION ICG, BILATERAL TAP BLOCK;  Surgeon: Teresa Lonni HERO, MD;   Location: [facility];  Service: General;  Laterality: N/A;  180   Allergies  Allergen Reactions   Desloratadine Other (See Comments)    CLARINEX-severe headache CLARITIN- severe headache   Loratadine Other (See Comments)    severe headache   Ketek [Telithromycin] Itching and Rash   Levbid  [Hyoscyamine  Sulfate] Rash      Objective:    Physical Exam Vitals and nursing note reviewed.  Constitutional:      General: He is not in acute distress.    Appearance: Normal appearance.  HENT:     Head: Normocephalic.     Mouth/Throat:     Tongue: Lesions (approx.1.5cm in diameter, smooth, dark pink in color, slightly raised bump on middle tongue next to middle crevass, tender) present.   Cardiovascular:     Rate and Rhythm: Normal rate and regular rhythm.  Pulmonary:     Effort: Pulmonary effort is normal.     Breath sounds: Normal breath sounds.  Musculoskeletal:        General: Normal range of motion.     Cervical back: Normal range of motion.  Skin:    General: Skin is warm and dry.  Neurological:     Mental Status: He is alert and oriented to person, place, and time.  Psychiatric:        Mood and Affect: Mood normal.    BP 111/71 (BP Location: Left Arm, Patient Position: Sitting, Cuff Size: Large)   Pulse 88   Temp 98.4 F (36.9 C) (Temporal)   Ht 5' 11 (1.803 m)   Wt 264 lb 4 oz (119.9 kg)   SpO2 92%   BMI 36.86 kg/m  Wt Readings from Last 3 Encounters:  09/11/24 264 lb 4 oz (119.9 kg)  09/03/24 267 lb (121.1 kg)  09/03/24 267 lb (121.1 kg)      Lucius Krabbe, NP

## 2024-09-15 LAB — URINE CYTOLOGY ANCILLARY ONLY
Chlamydia: NEGATIVE
Comment: NEGATIVE
Comment: NEGATIVE
Comment: NORMAL
Neisseria Gonorrhea: NEGATIVE
Trichomonas: NEGATIVE

## 2024-09-16 NOTE — Telephone Encounter (Signed)
 Called patient, has already been seen for this concern, no questions at this time

## 2024-09-17 ENCOUNTER — Ambulatory Visit: Payer: Self-pay | Admitting: Family

## 2024-09-17 ENCOUNTER — Other Ambulatory Visit: Payer: Self-pay | Admitting: Family Medicine

## 2024-09-17 DIAGNOSIS — R339 Retention of urine, unspecified: Secondary | ICD-10-CM

## 2024-09-22 ENCOUNTER — Other Ambulatory Visit: Payer: Self-pay | Admitting: Family Medicine

## 2024-09-22 DIAGNOSIS — E611 Iron deficiency: Secondary | ICD-10-CM

## 2024-09-22 MED ORDER — DOXYCYCLINE HYCLATE 100 MG PO TABS: 200.0000 mg | ORAL_TABLET | Freq: Every day | ORAL | 3 refills | Status: AC | PRN

## 2024-09-22 NOTE — Addendum Note (Signed)
 Addended by: LEVORA PURCHASE R on: 09/22/2024 08:36 AM   Modules accepted: Orders

## 2024-09-22 NOTE — Telephone Encounter (Signed)
 Is this the correct dose or is this the dose from the sinus infection?   Please advise.   Requested Prescriptions   Pending Prescriptions Disp Refills   doxycycline  (VIBRA -TABS) 100 MG tablet      Sig: Take 2 tablets (200 mg total) by mouth daily as needed.     Date of patient request: 09/22/2024 Last office visit: 09/03/2024 Upcoming visit: 09/17/2024 Date of last refill: 06/25/2024 Last refill amount: 0

## 2024-09-22 NOTE — Addendum Note (Signed)
 Addended by: Naileah Karg K on: 09/22/2024 08:14 AM   Modules accepted: Orders

## 2024-09-23 DIAGNOSIS — J301 Allergic rhinitis due to pollen: Secondary | ICD-10-CM | POA: Diagnosis not present

## 2024-09-23 DIAGNOSIS — J3089 Other allergic rhinitis: Secondary | ICD-10-CM | POA: Diagnosis not present

## 2024-09-24 ENCOUNTER — Other Ambulatory Visit: Payer: Self-pay | Admitting: Family Medicine

## 2024-09-24 ENCOUNTER — Ambulatory Visit (HOSPITAL_COMMUNITY)
Admission: RE | Admit: 2024-09-24 | Discharge: 2024-09-24 | Disposition: A | Discharge status: Home / Self Care | Source: Ambulatory Visit | Attending: Cardiovascular Disease | Admitting: Cardiovascular Disease

## 2024-09-24 DIAGNOSIS — R0609 Other forms of dyspnea: Secondary | ICD-10-CM

## 2024-09-24 DIAGNOSIS — F411 Generalized anxiety disorder: Secondary | ICD-10-CM

## 2024-09-24 DIAGNOSIS — R072 Precordial pain: Secondary | ICD-10-CM | POA: Diagnosis not present

## 2024-09-24 DIAGNOSIS — R079 Chest pain, unspecified: Secondary | ICD-10-CM | POA: Diagnosis not present

## 2024-09-24 DIAGNOSIS — R600 Localized edema: Secondary | ICD-10-CM | POA: Diagnosis not present

## 2024-09-24 LAB — ECHOCARDIOGRAM COMPLETE
AR max vel: 2.58 cm2
AV Area VTI: 2.41 cm2
AV Area mean vel: 2.28 cm2
AV Mean grad: 3 mmHg
AV Peak grad: 6.2 mmHg
Ao pk vel: 1.24 m/s
Area-P 1/2: 3.85 cm2
S' Lateral: 3.91 cm

## 2024-09-24 NOTE — Telephone Encounter (Signed)
 ALPRAZolam  (XANAX ) 1 MG tablet   Requested Prescriptions   Pending Prescriptions Disp Refills   ALPRAZolam  (XANAX ) 1 MG tablet [Pharmacy Med Name: ALPRAZOLAM  1MG  TABLETS] 60 tablet     Sig: TAKE 1 TABLET BY MOUTH TWICE DAILY AS NEEDED     Date of patient request: 09/24/2024 Last office visit: 09/03/2024 Upcoming visit: 12/10/2024 Date of last refill: 08/27/2024 Last refill amount: 60

## 2024-09-25 ENCOUNTER — Other Ambulatory Visit: Payer: Self-pay | Admitting: Family Medicine

## 2024-09-25 DIAGNOSIS — G43109 Migraine with aura, not intractable, without status migrainosus: Secondary | ICD-10-CM

## 2024-09-25 NOTE — Telephone Encounter (Signed)
 Medication and plan discussed in June.  Controlled substance database reviewed.  Alprazolam  1 mg #60 last filled on 08/29/2024, previously 07/30/2024.  Refill pended.

## 2024-09-26 ENCOUNTER — Ambulatory Visit: Payer: Self-pay | Admitting: Cardiovascular Disease

## 2024-09-29 ENCOUNTER — Encounter: Payer: Self-pay | Admitting: Family Medicine

## 2024-09-29 NOTE — Telephone Encounter (Signed)
 Patient sent in the following mychart message:  Would you like a OV to discuss concerns?

## 2024-09-30 NOTE — Telephone Encounter (Signed)
 See message from patient and recent echo indicating liver cyst, I do see a notation of the cyst on previous CT.  Will defer to gastroenterology if other imaging or testing needed as well as question from patient regarding the iron deficiency.  Thanks.

## 2024-09-30 NOTE — Telephone Encounter (Signed)
 Echo results routed to PCP

## 2024-10-04 DIAGNOSIS — R000 Tachycardia, unspecified: Secondary | ICD-10-CM | POA: Diagnosis not present

## 2024-10-05 ENCOUNTER — Encounter: Payer: Self-pay | Admitting: Gastroenterology

## 2024-10-05 ENCOUNTER — Other Ambulatory Visit: Payer: Self-pay | Admitting: *Deleted

## 2024-10-05 DIAGNOSIS — D509 Iron deficiency anemia, unspecified: Secondary | ICD-10-CM | POA: Insufficient documentation

## 2024-10-05 NOTE — Telephone Encounter (Signed)
 IV iron orders placed in EPIC to be completed at [facility] as appropriate.

## 2024-10-06 ENCOUNTER — Telehealth: Payer: Self-pay

## 2024-10-06 NOTE — Telephone Encounter (Signed)
 Thank you :)

## 2024-10-06 NOTE — Telephone Encounter (Signed)
 Dr. Leigh, patient will be scheduled as soon as possible.  Auth Submission: NO AUTH NEEDED Site of care: Site of care: [facility] Payer: [employer] commercial Medication & CPT/J Code(s) submitted: Feraheme (ferumoxytol) [id number] Diagnosis Code:  Route of submission (phone, fax, portal):  Phone # Fax # Auth type: Buy/Bill PB Units/visits requested: 510mg  x 2 doses Reference number:  Approval from: 10/06/24 to 11/04/24

## 2024-10-07 ENCOUNTER — Ambulatory Visit

## 2024-10-07 ENCOUNTER — Inpatient Hospital Stay (HOSPITAL_COMMUNITY)
Admission: EM | Admit: 2024-10-07 | Discharge: 2024-10-10 | DRG: 872 | Disposition: A | Discharge status: Home / Self Care | Source: Ambulatory Visit | Attending: Internal Medicine | Admitting: Internal Medicine

## 2024-10-07 ENCOUNTER — Emergency Department (HOSPITAL_COMMUNITY)

## 2024-10-07 ENCOUNTER — Encounter (HOSPITAL_COMMUNITY): Payer: Self-pay

## 2024-10-07 ENCOUNTER — Other Ambulatory Visit: Payer: Self-pay

## 2024-10-07 DIAGNOSIS — K4090 Unilateral inguinal hernia, without obstruction or gangrene, not specified as recurrent: Secondary | ICD-10-CM | POA: Diagnosis not present

## 2024-10-07 DIAGNOSIS — Z9049 Acquired absence of other specified parts of digestive tract: Secondary | ICD-10-CM

## 2024-10-07 DIAGNOSIS — Z79899 Other long term (current) drug therapy: Secondary | ICD-10-CM

## 2024-10-07 DIAGNOSIS — Z79811 Long term (current) use of aromatase inhibitors: Secondary | ICD-10-CM

## 2024-10-07 DIAGNOSIS — Z6836 Body mass index (BMI) 36.0-36.9, adult: Secondary | ICD-10-CM

## 2024-10-07 DIAGNOSIS — Z881 Allergy status to other antibiotic agents status: Secondary | ICD-10-CM

## 2024-10-07 DIAGNOSIS — I110 Hypertensive heart disease with heart failure: Secondary | ICD-10-CM | POA: Diagnosis present

## 2024-10-07 DIAGNOSIS — Z833 Family history of diabetes mellitus: Secondary | ICD-10-CM

## 2024-10-07 DIAGNOSIS — Z8249 Family history of ischemic heart disease and other diseases of the circulatory system: Secondary | ICD-10-CM

## 2024-10-07 DIAGNOSIS — F419 Anxiety disorder, unspecified: Secondary | ICD-10-CM | POA: Diagnosis present

## 2024-10-07 DIAGNOSIS — E785 Hyperlipidemia, unspecified: Secondary | ICD-10-CM | POA: Diagnosis present

## 2024-10-07 DIAGNOSIS — K529 Noninfective gastroenteritis and colitis, unspecified: Principal | ICD-10-CM | POA: Diagnosis present

## 2024-10-07 DIAGNOSIS — Z832 Family history of diseases of the blood and blood-forming organs and certain disorders involving the immune mechanism: Secondary | ICD-10-CM

## 2024-10-07 DIAGNOSIS — Z821 Family history of blindness and visual loss: Secondary | ICD-10-CM

## 2024-10-07 DIAGNOSIS — F32A Depression, unspecified: Secondary | ICD-10-CM | POA: Diagnosis present

## 2024-10-07 DIAGNOSIS — D509 Iron deficiency anemia, unspecified: Secondary | ICD-10-CM | POA: Diagnosis present

## 2024-10-07 DIAGNOSIS — A419 Sepsis, unspecified organism: Principal | ICD-10-CM | POA: Diagnosis present

## 2024-10-07 DIAGNOSIS — K219 Gastro-esophageal reflux disease without esophagitis: Secondary | ICD-10-CM | POA: Diagnosis present

## 2024-10-07 DIAGNOSIS — E876 Hypokalemia: Secondary | ICD-10-CM | POA: Diagnosis present

## 2024-10-07 DIAGNOSIS — K429 Umbilical hernia without obstruction or gangrene: Secondary | ICD-10-CM | POA: Diagnosis not present

## 2024-10-07 DIAGNOSIS — Z888 Allergy status to other drugs, medicaments and biological substances status: Secondary | ICD-10-CM

## 2024-10-07 DIAGNOSIS — I2510 Atherosclerotic heart disease of native coronary artery without angina pectoris: Secondary | ICD-10-CM | POA: Diagnosis present

## 2024-10-07 DIAGNOSIS — Z7951 Long term (current) use of inhaled steroids: Secondary | ICD-10-CM

## 2024-10-07 DIAGNOSIS — A039 Shigellosis, unspecified: Secondary | ICD-10-CM | POA: Diagnosis present

## 2024-10-07 DIAGNOSIS — Z79620 Long term (current) use of immunosuppressive biologic: Secondary | ICD-10-CM

## 2024-10-07 DIAGNOSIS — Z7982 Long term (current) use of aspirin: Secondary | ICD-10-CM

## 2024-10-07 DIAGNOSIS — K7689 Other specified diseases of liver: Secondary | ICD-10-CM | POA: Diagnosis not present

## 2024-10-07 DIAGNOSIS — J45909 Unspecified asthma, uncomplicated: Secondary | ICD-10-CM | POA: Diagnosis present

## 2024-10-07 DIAGNOSIS — E66812 Obesity, class 2: Secondary | ICD-10-CM | POA: Diagnosis present

## 2024-10-07 DIAGNOSIS — Z803 Family history of malignant neoplasm of breast: Secondary | ICD-10-CM

## 2024-10-07 DIAGNOSIS — I5032 Chronic diastolic (congestive) heart failure: Secondary | ICD-10-CM | POA: Diagnosis present

## 2024-10-07 HISTORY — DX: Diverticulitis of intestine, part unspecified, without perforation or abscess without bleeding: K57.92

## 2024-10-07 LAB — COMPREHENSIVE METABOLIC PANEL WITH GFR
ALT: 16 U/L (ref 0–44)
AST: 20 U/L (ref 15–41)
Albumin: 4.4 g/dL (ref 3.5–5.0)
Alkaline Phosphatase: 79 U/L (ref 38–126)
Anion gap: 10 (ref 5–15)
BUN: 9 mg/dL (ref 6–20)
CO2: 23 mmol/L (ref 22–32)
Calcium: 9.3 mg/dL (ref 8.9–10.3)
Chloride: 103 mmol/L (ref 98–111)
Creatinine, Ser: 1.19 mg/dL (ref 0.61–1.24)
GFR, Estimated: >60 mL/min (ref >60)
Glucose, Bld: 119 mg/dL — ABNORMAL HIGH (ref 70–99)
Potassium: 4 mmol/L (ref 3.5–5.1)
Sodium: 135 mmol/L (ref 135–145)
Total Bilirubin: 0.8 mg/dL (ref 0.0–1.2)
Total Protein: 7 g/dL (ref 6.5–8.1)

## 2024-10-07 LAB — URINALYSIS, ROUTINE W REFLEX MICROSCOPIC
Bacteria, UA: NONE SEEN
Bilirubin Urine: NEGATIVE
Glucose, UA: NEGATIVE mg/dL
Ketones, ur: NEGATIVE mg/dL
Leukocytes,Ua: NEGATIVE
Nitrite: NEGATIVE
Protein, ur: NEGATIVE mg/dL
Specific Gravity, Urine: 1.019 (ref 1.005–1.030)
pH: 5 (ref 5.0–8.0)

## 2024-10-07 LAB — CBC
HCT: 51.3 % (ref 39.0–52.0)
Hemoglobin: 17.3 g/dL — ABNORMAL HIGH (ref 13.0–17.0)
MCH: 27.8 pg (ref 26.0–34.0)
MCHC: 33.7 g/dL (ref 30.0–36.0)
MCV: 82.3 fL (ref 80.0–100.0)
Platelets: 166 K/uL (ref 150–400)
RBC: 6.23 MIL/uL — ABNORMAL HIGH (ref 4.22–5.81)
RDW: 17.5 % — ABNORMAL HIGH (ref 11.5–15.5)
WBC: 12.4 K/uL — ABNORMAL HIGH (ref 4.0–10.5)
nRBC: 0 % (ref 0.0–0.2)

## 2024-10-07 LAB — I-STAT CG4 LACTIC ACID, ED: Lactic Acid, Venous: 1.6 mmol/L (ref 0.5–1.9)

## 2024-10-07 LAB — LIPASE, BLOOD: Lipase: 31 U/L (ref 11–51)

## 2024-10-07 MED ORDER — ONDANSETRON HCL 4 MG/2ML IJ SOLN
4.0000 mg | Freq: Four times a day (QID) | INTRAMUSCULAR | Status: DC | PRN
  Administered 2024-10-07 – 2024-10-09 (×4): 4 mg via INTRAVENOUS
  Filled 2024-10-07 (×5): qty 2

## 2024-10-07 MED ORDER — BUDESONIDE 0.5 MG/2ML IN SUSP
0.5000 mg | Freq: Two times a day (BID) | RESPIRATORY_TRACT | Status: DC
  Administered 2024-10-07: 0.5 mg via RESPIRATORY_TRACT
  Filled 2024-10-07: qty 2

## 2024-10-07 MED ORDER — ONDANSETRON HCL 4 MG PO TABS
4.0000 mg | ORAL_TABLET | Freq: Four times a day (QID) | ORAL | Status: DC | PRN
  Administered 2024-10-09 – 2024-10-10 (×2): 4 mg via ORAL
  Filled 2024-10-07 (×2): qty 1

## 2024-10-07 MED ORDER — LACTATED RINGERS IV BOLUS
1000.0000 mL | Freq: Once | INTRAVENOUS | Status: AC
  Administered 2024-10-07: 1000 mL via INTRAVENOUS

## 2024-10-07 MED ORDER — OLOPATADINE-MOMETASONE 665-25 MCG/ACT NA SUSP
1.0000 | Freq: Two times a day (BID) | NASAL | Status: DC
  Administered 2024-10-07 – 2024-10-10 (×6): 1 via NASAL

## 2024-10-07 MED ORDER — MORPHINE SULFATE (PF) 4 MG/ML IV SOLN
8.0000 mg | Freq: Once | INTRAVENOUS | Status: AC
  Administered 2024-10-07: 8 mg via INTRAVENOUS
  Filled 2024-10-07: qty 2

## 2024-10-07 MED ORDER — FERROUS SULFATE 325 (65 FE) MG PO TABS
325.0000 mg | ORAL_TABLET | Freq: Every day | ORAL | Status: DC
  Administered 2024-10-08 – 2024-10-09 (×2): 325 mg via ORAL
  Filled 2024-10-07 (×3): qty 1

## 2024-10-07 MED ORDER — DICYCLOMINE HCL 20 MG PO TABS
20.0000 mg | ORAL_TABLET | Freq: Three times a day (TID) | ORAL | Status: DC
  Administered 2024-10-07 – 2024-10-10 (×8): 20 mg via ORAL
  Filled 2024-10-07 (×9): qty 1

## 2024-10-07 MED ORDER — EMTRICITABINE-TENOFOVIR AF 200-25 MG PO TABS
1.0000 | ORAL_TABLET | Freq: Every day | ORAL | Status: DC
  Administered 2024-10-08 – 2024-10-10 (×3): 1 via ORAL
  Filled 2024-10-07 (×3): qty 1

## 2024-10-07 MED ORDER — ALPRAZOLAM 0.5 MG PO TABS
1.0000 mg | ORAL_TABLET | Freq: Three times a day (TID) | ORAL | Status: DC | PRN
  Administered 2024-10-07 – 2024-10-10 (×5): 1 mg via ORAL
  Filled 2024-10-07 (×5): qty 2

## 2024-10-07 MED ORDER — FEXOFENADINE HCL 180 MG PO TABS
180.0000 mg | ORAL_TABLET | Freq: Every day | ORAL | Status: DC
  Administered 2024-10-08 – 2024-10-10 (×3): 180 mg via ORAL
  Filled 2024-10-07 (×4): qty 1

## 2024-10-07 MED ORDER — PANTOPRAZOLE SODIUM 40 MG PO TBEC
40.0000 mg | DELAYED_RELEASE_TABLET | Freq: Every day | ORAL | Status: DC
  Administered 2024-10-07 – 2024-10-10 (×4): 40 mg via ORAL
  Filled 2024-10-07 (×4): qty 1

## 2024-10-07 MED ORDER — SODIUM CHLORIDE 0.9 % IV SOLN: 3.0000 g | Freq: Once | INTRAVENOUS | Status: DC

## 2024-10-07 MED ORDER — ACETAMINOPHEN 650 MG RE SUPP: 650.0000 mg | Freq: Four times a day (QID) | RECTAL | Status: DC | PRN

## 2024-10-07 MED ORDER — MONTELUKAST SODIUM 10 MG PO TABS
10.0000 mg | ORAL_TABLET | Freq: Every day | ORAL | Status: DC
  Administered 2024-10-07 – 2024-10-09 (×3): 10 mg via ORAL
  Filled 2024-10-07 (×3): qty 1

## 2024-10-07 MED ORDER — LOPERAMIDE HCL 2 MG PO CAPS
2.0000 mg | ORAL_CAPSULE | ORAL | Status: DC | PRN
  Administered 2024-10-07 – 2024-10-08 (×3): 2 mg via ORAL
  Filled 2024-10-07 (×3): qty 1

## 2024-10-07 MED ORDER — HYDROMORPHONE HCL 1 MG/ML IJ SOLN
1.0000 mg | INTRAMUSCULAR | Status: DC | PRN
  Administered 2024-10-07 – 2024-10-09 (×4): 1 mg via INTRAVENOUS
  Filled 2024-10-07 (×4): qty 1

## 2024-10-07 MED ORDER — ACETAMINOPHEN 325 MG PO TABS
650.0000 mg | ORAL_TABLET | Freq: Four times a day (QID) | ORAL | Status: DC | PRN
  Administered 2024-10-07 – 2024-10-10 (×3): 650 mg via ORAL
  Filled 2024-10-07 (×3): qty 2

## 2024-10-07 MED ORDER — LORATADINE 10 MG PO TABS: 10.0000 mg | ORAL_TABLET | Freq: Every day | ORAL | Status: DC

## 2024-10-07 MED ORDER — IOHEXOL 300 MG/ML  SOLN
100.0000 mL | Freq: Once | INTRAMUSCULAR | Status: AC | PRN
  Administered 2024-10-07: 100 mL via INTRAVENOUS

## 2024-10-07 MED ORDER — ENOXAPARIN SODIUM 60 MG/0.6ML IJ SOSY
60.0000 mg | PREFILLED_SYRINGE | Freq: Every day | INTRAMUSCULAR | Status: DC
  Administered 2024-10-07 – 2024-10-10 (×4): 60 mg via SUBCUTANEOUS
  Filled 2024-10-07 (×4): qty 0.6

## 2024-10-07 MED ORDER — BUDESON-GLYCOPYRROL-FORMOTEROL 160-9-4.8 MCG/ACT IN AERO
2.0000 | INHALATION_SPRAY | Freq: Two times a day (BID) | RESPIRATORY_TRACT | Status: DC
  Administered 2024-10-07 – 2024-10-10 (×6): 2 via RESPIRATORY_TRACT
  Filled 2024-10-07: qty 5.9

## 2024-10-07 MED ORDER — ROSUVASTATIN CALCIUM 20 MG PO TABS
10.0000 mg | ORAL_TABLET | Freq: Every day | ORAL | Status: DC
  Administered 2024-10-07 – 2024-10-10 (×4): 10 mg via ORAL
  Filled 2024-10-07 (×4): qty 1

## 2024-10-07 MED ORDER — RIZATRIPTAN BENZOATE 10 MG PO TBDP: 10.0000 mg | ORAL_TABLET | Freq: Once | ORAL | Status: DC | PRN

## 2024-10-07 MED ORDER — LACTATED RINGERS IV SOLN: INTRAVENOUS | Status: DC

## 2024-10-07 MED ORDER — ACETAMINOPHEN 325 MG PO TABS
650.0000 mg | ORAL_TABLET | Freq: Once | ORAL | Status: AC
  Administered 2024-10-07: 650 mg via ORAL
  Filled 2024-10-07: qty 2

## 2024-10-07 MED ORDER — IPRATROPIUM BROMIDE 0.06 % NA SOLN
2.0000 | Freq: Two times a day (BID) | NASAL | Status: DC
  Filled 2024-10-07: qty 15

## 2024-10-07 MED ORDER — ZOLPIDEM TARTRATE 5 MG PO TABS
10.0000 mg | ORAL_TABLET | Freq: Every evening | ORAL | Status: DC | PRN
  Administered 2024-10-07 – 2024-10-09 (×3): 10 mg via ORAL
  Filled 2024-10-07 (×3): qty 2

## 2024-10-07 MED ORDER — METOPROLOL SUCCINATE ER 50 MG PO TB24
50.0000 mg | ORAL_TABLET | Freq: Every day | ORAL | Status: DC
  Administered 2024-10-07 – 2024-10-10 (×4): 50 mg via ORAL
  Filled 2024-10-07 (×4): qty 1

## 2024-10-07 MED ORDER — ALBUTEROL SULFATE (2.5 MG/3ML) 0.083% IN NEBU: 2.5000 mg | INHALATION_SOLUTION | RESPIRATORY_TRACT | Status: DC | PRN

## 2024-10-07 MED ORDER — ASPIRIN 81 MG PO TBEC
81.0000 mg | DELAYED_RELEASE_TABLET | Freq: Every day | ORAL | Status: DC
  Administered 2024-10-08 – 2024-10-10 (×3): 81 mg via ORAL
  Filled 2024-10-07 (×3): qty 1

## 2024-10-07 MED ORDER — PIPERACILLIN-TAZOBACTAM 3.375 G IVPB
3.3750 g | Freq: Three times a day (TID) | INTRAVENOUS | Status: DC
  Administered 2024-10-07 – 2024-10-09 (×7): 3.375 g via INTRAVENOUS
  Filled 2024-10-07 (×7): qty 50

## 2024-10-07 MED ORDER — OXYCODONE HCL 5 MG PO TABS
10.0000 mg | ORAL_TABLET | ORAL | Status: DC | PRN
  Administered 2024-10-08 – 2024-10-10 (×6): 10 mg via ORAL
  Filled 2024-10-07 (×6): qty 2

## 2024-10-07 MED ORDER — ONDANSETRON HCL 4 MG/2ML IJ SOLN
4.0000 mg | Freq: Once | INTRAMUSCULAR | Status: AC
  Administered 2024-10-07: 4 mg via INTRAVENOUS
  Filled 2024-10-07: qty 2

## 2024-10-07 MED ORDER — PIPERACILLIN-TAZOBACTAM 3.375 G IVPB 30 MIN
3.3750 g | Freq: Once | INTRAVENOUS | Status: AC
  Administered 2024-10-07: 3.375 g via INTRAVENOUS
  Filled 2024-10-07: qty 50

## 2024-10-07 NOTE — ED Notes (Signed)
 Pt states that he has a history of tachycardia and is on daily medication for it.

## 2024-10-07 NOTE — H&P (Signed)
 History and Physical  Randall Reed. FMW:[id number] DOB: Dec 23, 1970 DOA: 10/07/2024  PCP: Levora Reyes SAUNDERS, MD   Chief Complaint: Vomiting, diarrhea  HPI: Randall Reed. is a 53 y.o. male with medical history significant for anxiety, hypertension, hyperlipidemia, colectomy due to diverticulitis being admitted to the hospital with 3 days of diarrhea, vomiting and abdominal discomfort.  History is provided by the patient, who states he was in his usual state of health until about 3 days ago, when he started having copious watery diarrhea with associated abdominal cramping.  There was also some mild nausea, but he started having significant vomiting over the last 24 hours.  He denies any fevers, chills, chest pain, any hematemesis or blood in his stools.  No sick contacts, had Thanksgiving dinner with his family, none of the other for members got sick.  States now that he has some mild nausea, his diarrhea is already slowing down since he came to the ER early this morning.  Review of Systems: Please see HPI for pertinent positives and negatives. A complete 10 system review of systems are otherwise negative.  Past Medical History:  Diagnosis Date   Allergy    Anemia    Anxiety    Arthritis    Asthma    CAD (coronary artery disease) 03/04/2024   CHF (congestive heart failure) (HCC) 03/04/2024   EF 50%   Complication of anesthesia    oxygen levelds dropped  with colonoscopy   Depression    Diverticulitis    Dyspnea    sometimes with asthma has sob   GERD (gastroesophageal reflux disease)    Headache    Hyperlipidemia    Phreesia 12/05/2020   Hypertension    Pneumonia    Seasonal allergic reaction    Sleep apnea    Past Surgical History:  Procedure Laterality Date   CIRCUMCISION  10/2022   COLON SURGERY  10/11/2023   COLONOSCOPY  07/17/2022   FLEXIBLE SIGMOIDOSCOPY N/A 10/11/2023   Procedure: FLEXIBLE SIGMOIDOSCOPY;  Surgeon: Teresa Lonni HERO, MD;  Location: [facility];   Service: General;  Laterality: N/A;   LIPOMA EXCISION N/A 10/30/2018   Procedure: EXCISION OF MULTIPLE SUBCUTANEOUS LIPOMAS ON TORSO;  Surgeon: Belinda Cough, MD;  Location: [facility];  Service: General;  Laterality: N/A;   UPPER GASTROINTESTINAL ENDOSCOPY  07/17/2022   XI ROBOTIC ASSISTED LOWER ANTERIOR RESECTION N/A 10/11/2023   Procedure: XI ROBOTIC ASSISTED LOWER ANTERIOR RESECTION, SIGMOIDECTOMY WITH INTRAOPERATIVE ASSESSMENT OF PERFUSION ICG, BILATERAL TAP BLOCK;  Surgeon: Teresa Lonni HERO, MD;  Location: [facility];  Service: General;  Laterality: N/A;  180   Social History:  reports that he has never smoked. He has never used smokeless tobacco. He reports that he does not currently use alcohol after a past usage of about 3.0 standard drinks of alcohol per week. He reports that he does not use drugs.  Allergies  Allergen Reactions   Desloratadine Other (See Comments)    CLARINEX-severe headache CLARITIN- severe headache   Loratadine Other (See Comments)    severe headache   Ketek [Telithromycin] Itching and Rash   Levbid  [Hyoscyamine  Sulfate] Rash    Family History  Problem Relation Age of Onset   Coronary artery disease Father 77       stents, CABG   Heart attack Father    Diabetes Father    Early death Father    Heart disease Father    Hypertension Father    Obesity Father  Varicose Veins Father    Heart attack Maternal Grandfather    Cancer Paternal Grandmother    Diabetes Paternal Grandmother    Hypertension Paternal Grandmother    Vision loss Paternal Grandmother    Varicose Veins Paternal Grandmother    Coronary artery disease Paternal Grandfather    Cancer Paternal Grandfather    Diabetes Paternal Grandfather    Heart disease Paternal Grandfather    Hypertension Paternal Grandfather    Obesity Paternal Grandfather    Hypertension Other    Heart attack Other    Cancer Other    Breast cancer Other    Hyperlipidemia Other    Diabetes  Paternal Aunt    Early death Paternal Aunt    Heart disease Paternal Aunt    Hypertension Paternal Aunt    Obesity Paternal Aunt    Stomach cancer Neg Hx    Esophageal cancer Neg Hx    Colon cancer Neg Hx    Colon polyps Neg Hx    Rectal cancer Neg Hx      Prior to Admission medications   Medication Sig Start Date End Date Taking? Authorizing Provider  AIRSUPRA  90-80 MCG/ACT AERO Inhale 2 puffs into the lungs every 4 (four) hours as needed. Patient taking differently: Inhale 2 puffs into the lungs 2 (two) times daily. 03/30/23   [provider]  albuterol  (PROVENTIL ) (2.5 MG/3ML) 0.083% nebulizer solution  02/27/24   [provider]  albuterol  (VENTOLIN  HFA) 108 (90 Base) MCG/ACT inhaler INHALE 2 PUFFS INTO THE LUNGS EVERY 6 HOURS AS NEEDED FOR WHEEZING OR SHORTNESS OF BREATH 07/19/20   Levora Reyes SAUNDERS, MD  [facility] ALLERGY 180 MG tablet Take 180 mg by mouth daily. 07/12/20   [provider]  ALPRAZolam  (XANAX ) 1 MG tablet TAKE 1 TABLET BY MOUTH TWICE DAILY AS NEEDED 09/25/24   Levora Reyes SAUNDERS, MD  anastrozole (ARIMIDEX) 1 MG tablet Take 0.5 mg by mouth once a week.    [provider]  aspirin  EC 81 MG tablet Take 1 tablet (81 mg total) by mouth daily. 03/07/15   Wonda Sharper, MD  Budeson-Glycopyrrol-Formoterol  (BREZTRI AEROSPHERE) 160-9-4.8 MCG/ACT AERO Inhale 2 puffs into the lungs in the morning and at bedtime. 09/14/21   [provider]  budesonide  (PULMICORT ) 0.5 MG/2ML nebulizer solution 2 (two) times daily. 06/29/24   [provider]  clotrimazole  (MYCELEX ) 10 MG troche SMARTSIG:1 Lozenge(s) By Mouth 5 Times Daily 02/27/24   [provider]  cyanocobalamin  (VITAMIN B12) 1000 MCG tablet Take 1 tablet (1,000 mcg total) by mouth daily. 05/04/24   Levora Reyes SAUNDERS, MD  dicyclomine  (BENTYL ) 20 MG tablet Take 1 tablet (20 mg total) by mouth in the morning and at bedtime. 05/25/24   [place], PA-C  doxycycline  (VIBRA -TABS) 100  MG tablet Take 2 tablets (200 mg total) by mouth daily as needed. 09/22/24   Levora Reyes SAUNDERS, MD  DUPIXENT 300 MG/2ML prefilled syringe Inject 300 mg into the skin every 14 (fourteen) days. 10/06/21   [provider]  emtricitabine -tenofovir  (TRUVADA ) 200-300 MG tablet TAKE 1 TABLET BY MOUTH DAILY 04/30/24   Levora Reyes SAUNDERS, MD  famotidine (PEPCID) 40 MG tablet Take 40 mg by mouth daily as needed for heartburn or indigestion. 02/18/20   [provider]  FEROSUL 325 (65 Fe) MG tablet TAKE 1 TABLET(325 MG) BY MOUTH DAILY WITH BREAKFAST 09/22/24   Levora Reyes SAUNDERS, MD  furosemide  (LASIX ) 20 MG tablet Take 1 tablet (20 mg total) by mouth daily. As  needed for edema 09/03/24   Wonda Sharper, MD  ipratropium (ATROVENT ) 0.06 % nasal spray Place 2 sprays into the nose 2 (two) times daily. 05/25/21   Levora Reyes SAUNDERS, MD  ipratropium-albuterol  (DUONEB) 0.5-2.5 (3) MG/3ML SOLN Take 3 mLs by nebulization every 6 (six) hours as needed (Shortness of breath or wheezing). 12/25/21   [provider]  metoprolol  succinate (TOPROL -XL) 50 MG 24 hr tablet Take 1 tablet (50 mg total) by mouth daily. Take with or immediately following a meal. 09/03/24   Wonda Sharper, MD  mirabegron ER (MYRBETRIQ) 25 MG TB24 tablet Take 25 mg by mouth daily as needed (bladder spasm).    [provider]  montelukast  (SINGULAIR ) 10 MG tablet TAKE 1 TABLET(10 MG) BY MOUTH AT BEDTIME 03/23/24   Levora Reyes SAUNDERS, MD  olopatadine  (PATADAY ) 0.1 % ophthalmic solution Place 1 drop into both eyes 2 (two) times daily as needed for allergies. 01/17/24   Jerrell Cleatus Ned, MD  Olopatadine -Mometasone  JODELLE) [phone] MCG/ACT SUSP Place 1 spray into the nose in the morning and at bedtime.    [provider]  ondansetron  (ZOFRAN -ODT) 4 MG disintegrating tablet Take 1 tablet (4 mg total) by mouth every 8 (eight) hours as needed for nausea or vomiting. 05/25/24   [place], PA-C  pantoprazole   (PROTONIX ) 40 MG tablet Take 1 tablet (40 mg total) by mouth daily. 05/01/24   Levora Reyes SAUNDERS, MD  Potassium Chloride  ER 20 MEQ TBCR TAKE 1 TABLET(20 MEQ) BY MOUTH DAILY 07/01/24   Wonda Sharper, MD  pramoxine-hydrocortisone  [facility]) 1-1 % rectal cream Place rectally 3 (three) times daily. 05/07/22   Levora Reyes SAUNDERS, MD  rizatriptan  (MAXALT -MLT) 10 MG disintegrating tablet DISSOLVE 1 TABLET IN MOUTH AS NEEDED FOR HEADACHE. REPEAT  IN  2  HOURS  AS  NEEDED. 09/25/24   Levora Reyes SAUNDERS, MD  rosuvastatin  (CRESTOR ) 10 MG tablet Take 1 tablet (10 mg total) by mouth daily. 09/03/24   Wonda Sharper, MD  sildenafil  (VIAGRA ) 100 MG tablet Take 1/2-1 tablet by mouth once daily as needed prior to sexual activity *MUST KEEP UPCOMING APPT* 04/02/23   Wonda Sharper, MD  Spacer/Aero-Holding Chambers DEVI Use with inhaler 05/15/22   Icard, Adine CROME, DO  tadalafil  (CIALIS ) 5 MG tablet Take 5 mg by mouth daily. 02/01/23   [provider]  tamsulosin  (FLOMAX ) 0.4 MG CAPS capsule TAKE 1 CAPSULE(0.4 MG) BY MOUTH DAILY AS NEEDED 09/17/24   Levora Reyes SAUNDERS, MD  testosterone  cypionate (DEPOTESTOSTERONE CYPIONATE) 200 MG/ML injection Inject 100 mg into the muscle once a week. 07/07/22   [provider]  valsartan  (DIOVAN ) 80 MG tablet Take 1 tablet (80 mg total) by mouth daily. 09/03/24   Wonda Sharper, MD  Vitamin D , Ergocalciferol , (DRISDOL ) 1.25 MG (50000 UNIT) CAPS capsule TAKE ONE CAPSULE BY MOUTH EVERY 7 DAYS 08/13/24   Levora Reyes SAUNDERS, MD  Vonoprazan Fumarate  (VOQUEZNA ) 10 MG TABS Take 1 tablet by mouth daily. 07/14/24   Leigh Elspeth SQUIBB, MD  zolpidem  (AMBIEN ) 10 MG tablet TAKE 1 TABLET(10 MG) BY MOUTH AT BEDTIME 07/21/24   Levora Reyes SAUNDERS, MD  dicyclomine  (BENTYL ) 20 MG tablet Take 20 mg by mouth 3 (three) times daily before meals.    [provider]    Physical Exam: BP 123/77   Pulse (!) 127   Temp (!) 100.5 F (38.1 C) (Oral)   Resp (!) 34   Ht 5' 11 (1.803 m)    Wt 119.9 kg   SpO2 90%  BMI 36.87 kg/m  General:  Alert, oriented, calm, in no acute distress, looks nontoxic, his brother and mother are at the bedside Cardiovascular: RRR, no murmurs or rubs, no peripheral edema  Respiratory: clear to auscultation bilaterally, no wheezes, no crackles  Abdomen: soft, tender to deep palpation in the left upper quadrant, without guarding or rebound, nondistended, normal bowel tones heard  Skin: dry, no rashes  Musculoskeletal: no joint effusions, normal range of motion  Psychiatric: appropriate affect, normal speech  Neurologic: extraocular muscles intact, clear speech, moving all extremities with intact sensorium         Labs on Admission:  Basic Metabolic Panel: Recent Labs  Lab 10/07/24 0812  NA 135  K 4.0  CL 103  CO2 23  GLUCOSE 119*  BUN 9  CREATININE 1.19  CALCIUM  9.3   Liver Function Tests: Recent Labs  Lab 10/07/24 0812  AST 20  ALT 16  ALKPHOS 79  BILITOT 0.8  PROT 7.0  ALBUMIN 4.4   Recent Labs  Lab 10/07/24 0812  LIPASE 31   No results for input(s): AMMONIA in the last 168 hours. CBC: Recent Labs  Lab 10/07/24 0812  WBC 12.4*  HGB 17.3*  HCT 51.3  MCV 82.3  PLT 166   Cardiac Enzymes: No results for input(s): CKTOTAL, CKMB, CKMBINDEX, TROPONINI in the last 168 hours. BNP (last 3 results) No results for input(s): BNP in the last 8760 hours.  ProBNP (last 3 results) Recent Labs    03/18/24 0913  PROBNP <36    CBG: No results for input(s): GLUCAP in the last 168 hours.  Radiological Exams on Admission: CT ABDOMEN PELVIS W CONTRAST Result Date: 10/07/2024 CLINICAL DATA:  Abdominal pain.  History of sigmoid colectomy. EXAM: CT ABDOMEN AND PELVIS WITH CONTRAST TECHNIQUE: Multidetector CT imaging of the abdomen and pelvis was performed using the standard protocol following bolus administration of intravenous contrast. RADIATION DOSE REDUCTION: This exam was performed according to the  departmental dose-optimization program which includes automated exposure control, adjustment of the mA and/or kV according to patient size and/or use of iterative reconstruction technique. CONTRAST:  OMNIPAQUE  IOHEXOL  300 MG/ML  SOLN COMPARISON:  05/19/2024 FINDINGS: Lower chest: No acute abnormality. Hepatobiliary: Stable benign left lobe hepatic cyst. Normal gallbladder. No biliary dilatation. Pancreas: Unremarkable. No pancreatic ductal dilatation or surrounding inflammatory changes. Spleen: Normal in size without focal abnormality. Adrenals/Urinary Tract: Adrenal glands are unremarkable. Kidneys are normal, without renal calculi, focal lesion, or hydronephrosis. Bladder is unremarkable. Stomach/Bowel: Although decompressed, there may be some slight inflammation the transverse and ascending colon which may implicate some degree enteritis/colitis. Bowel shows no evidence of obstruction, ileus or lesion. The appendix is normal. No free intraperitoneal air. Intact and stable appearance rectosigmoid anastomosis without evidence to suggest anastomotic leak or breakdown. Vascular/Lymphatic: No significant vascular findings are present. No enlarged abdominal or pelvic lymph nodes. Reproductive: Prostate is unremarkable. Other: Small right inguinal hernia containing fat. Stable small umbilical hernia containing fat. No ascites or abscess. Musculoskeletal: No acute or significant osseous findings. IMPRESSION: 1. Although decompressed, there may be some slight inflammation of the transverse and ascending colon which may implicate some degree of enteritis/colitis. 2. Stable small umbilical hernia containing fat. 3. Small right inguinal hernia containing fat. 4. Intact rectosigmoid surgical anastomosis. Electronically Signed   By: Marcey Moan M.D.   On: 10/07/2024 10:21   Assessment/Plan Randall Reed. is a 53 y.o. male with medical history significant for anxiety, hypertension, hyperlipidemia, colectomy  due to  diverticulitis being admitted to the hospital with 3 days of diarrhea, vomiting and abdominal discomfort.   Colitis-unclear etiology, may be infectious, but no evidence of bacterial infection, no recent antibiotics or symptomatology to suggest C. difficile colitis. -Observation admission -Diet as tolerated -Empiric IV Zosyn -Loperamide for diarrhea, Bentyl  for abdominal cramping -Discussed with Dr. Teresa of general surgery at the patient's request, agrees with current management -I see no indication to involve GI currently, can consult [facility] if patient fails to improve or other complications arise  Anxiety-Xanax  1 mg p.o. 3 times daily as needed  Iron deficiency-was scheduled for iron infusion per Dr. Leigh of GI, this appointment has been rescheduled and will resume the patient's oral iron supplementation for the time being  GERD-Protonix   Hypertension-holding home antihypertensives for the time being given relative hypotension  Hyperlipidemia-Crestor   DVT prophylaxis: Lovenox     Code Status: Full Code  Consults called: Discussed with general surgery Dr. Teresa at the patient's request.  Admission status: Observation  Time spent: 52 minutes  Randall Reed CHRISTELLA Gail MD [facility] Pager [phone]  If 7PM-7AM, please contact night-coverage [email or web address] Password [email or web address]  10/07/2024, 11:24 AM

## 2024-10-07 NOTE — ED Triage Notes (Addendum)
 Pt c/o w/lower abdominal pain that started last night, chills, N&V. No fever. Hx of sigmoidectomy 10/11/23. Pt states he felt constipated yesterday & used an enema; has been having diarrhea since.

## 2024-10-07 NOTE — ED Provider Notes (Signed)
 [place] EMERGENCY DEPARTMENT AT [facility] Provider Note   CSN: [id number] Arrival date & time: 10/07/24  9248     Patient presents with: Abdominal Pain, Emesis, Nausea, and Diarrhea   Randall Reed. is a 53 y.o. male.   53 year old male with history of sigmoidectomy due to diverticulitis presents due to 1 day history of abdominal pain with bilious emesis.  Has had shaking chills at home.  Denies any urinary symptoms.  Pain characterized as constant and sharp.  Nothing better or worse and no treatment used prior to arrival.       Prior to Admission medications   Medication Sig Start Date End Date Taking? Authorizing Provider  AIRSUPRA  90-80 MCG/ACT AERO Inhale 2 puffs into the lungs every 4 (four) hours as needed. Patient taking differently: Inhale 2 puffs into the lungs 2 (two) times daily. 03/30/23   [provider]  albuterol  (PROVENTIL ) (2.5 MG/3ML) 0.083% nebulizer solution  02/27/24   [provider]  albuterol  (VENTOLIN  HFA) 108 (90 Base) MCG/ACT inhaler INHALE 2 PUFFS INTO THE LUNGS EVERY 6 HOURS AS NEEDED FOR WHEEZING OR SHORTNESS OF BREATH 07/19/20   Levora Reyes SAUNDERS, MD  [facility] ALLERGY 180 MG tablet Take 180 mg by mouth daily. 07/12/20   [provider]  ALPRAZolam  (XANAX ) 1 MG tablet TAKE 1 TABLET BY MOUTH TWICE DAILY AS NEEDED 09/25/24   Greene, Jeffrey R, MD  anastrozole (ARIMIDEX) 1 MG tablet Take 0.5 mg by mouth once a week.    [provider]  aspirin  EC 81 MG tablet Take 1 tablet (81 mg total) by mouth daily. 03/07/15   Wonda Sharper, MD  Budeson-Glycopyrrol-Formoterol  (BREZTRI AEROSPHERE) 160-9-4.8 MCG/ACT AERO Inhale 2 puffs into the lungs in the morning and at bedtime. 09/14/21   [provider]  budesonide  (PULMICORT ) 0.5 MG/2ML nebulizer solution 2 (two) times daily. 06/29/24   [provider]  clotrimazole  (MYCELEX ) 10 MG troche SMARTSIG:1 Lozenge(s) By Mouth 5 Times Daily 02/27/24   [provider]  cyanocobalamin  (VITAMIN B12) 1000 MCG tablet Take 1 tablet (1,000 mcg total) by mouth daily. 05/04/24   Levora Reyes SAUNDERS, MD  dicyclomine  (BENTYL ) 20 MG tablet Take 1 tablet (20 mg total) by mouth in the morning and at bedtime. 05/25/24   [place], PA-C  doxycycline  (VIBRA -TABS) 100 MG tablet Take 2 tablets (200 mg total) by mouth daily as needed. 09/22/24   Levora Reyes SAUNDERS, MD  DUPIXENT 300 MG/2ML prefilled syringe Inject 300 mg into the skin every 14 (fourteen) days. 10/06/21   [provider]  emtricitabine -tenofovir  (TRUVADA ) 200-300 MG tablet TAKE 1 TABLET BY MOUTH DAILY 04/30/24   Levora Reyes SAUNDERS, MD  famotidine (PEPCID) 40 MG tablet Take 40 mg by mouth daily as needed for heartburn or indigestion. 02/18/20   [provider]  FEROSUL 325 (65 Fe) MG tablet TAKE 1 TABLET(325 MG) BY MOUTH DAILY WITH BREAKFAST 09/22/24   Levora Reyes SAUNDERS, MD  furosemide  (LASIX ) 20 MG tablet Take 1 tablet (20 mg total) by mouth daily. As needed for edema 09/03/24   Wonda Sharper, MD  ipratropium (ATROVENT ) 0.06 % nasal spray Place 2 sprays into the nose 2 (two) times daily. 05/25/21   Levora Reyes SAUNDERS, MD  ipratropium-albuterol  (DUONEB) 0.5-2.5 (3) MG/3ML SOLN Take 3 mLs by nebulization every 6 (six) hours as needed (Shortness of breath or wheezing). 12/25/21   [provider]  metoprolol  succinate (TOPROL -XL) 50 MG 24 hr tablet Take 1 tablet (50 mg  total) by mouth daily. Take with or immediately following a meal. 09/03/24   Wonda Sharper, MD  mirabegron ER (MYRBETRIQ) 25 MG TB24 tablet Take 25 mg by mouth daily as needed (bladder spasm).    [provider]  montelukast  (SINGULAIR ) 10 MG tablet TAKE 1 TABLET(10 MG) BY MOUTH AT BEDTIME 03/23/24   Levora Reyes SAUNDERS, MD  olopatadine  (PATADAY ) 0.1 % ophthalmic solution Place 1 drop into both eyes 2 (two) times daily as needed for allergies. 01/17/24   Jerrell Cleatus Ned, MD  Olopatadine -Mometasone  ([name])  [phone] MCG/ACT SUSP Place 1 spray into the nose in the morning and at bedtime.    [provider]  ondansetron  (ZOFRAN -ODT) 4 MG disintegrating tablet Take 1 tablet (4 mg total) by mouth every 8 (eight) hours as needed for nausea or vomiting. 05/25/24   [place], PA-C  pantoprazole  (PROTONIX ) 40 MG tablet Take 1 tablet (40 mg total) by mouth daily. 05/01/24   Levora Reyes SAUNDERS, MD  Potassium Chloride  ER 20 MEQ TBCR TAKE 1 TABLET(20 MEQ) BY MOUTH DAILY 07/01/24   Wonda Sharper, MD  pramoxine-hydrocortisone  [facility]) 1-1 % rectal cream Place rectally 3 (three) times daily. 05/07/22   Levora Reyes SAUNDERS, MD  rizatriptan  (MAXALT -MLT) 10 MG disintegrating tablet DISSOLVE 1 TABLET IN MOUTH AS NEEDED FOR HEADACHE. REPEAT  IN  2  HOURS  AS  NEEDED. 09/25/24   Levora Reyes SAUNDERS, MD  rosuvastatin  (CRESTOR ) 10 MG tablet Take 1 tablet (10 mg total) by mouth daily. 09/03/24   Wonda Sharper, MD  sildenafil  (VIAGRA ) 100 MG tablet Take 1/2-1 tablet by mouth once daily as needed prior to sexual activity *MUST KEEP UPCOMING APPT* 04/02/23   Wonda Sharper, MD  Spacer/Aero-Holding Chambers DEVI Use with inhaler 05/15/22   Icard, Adine CROME, DO  tadalafil  (CIALIS ) 5 MG tablet Take 5 mg by mouth daily. 02/01/23   [provider]  tamsulosin  (FLOMAX ) 0.4 MG CAPS capsule TAKE 1 CAPSULE(0.4 MG) BY MOUTH DAILY AS NEEDED 09/17/24   Levora Reyes SAUNDERS, MD  testosterone  cypionate (DEPOTESTOSTERONE CYPIONATE) 200 MG/ML injection Inject 100 mg into the muscle once a week. 07/07/22   [provider]  valsartan  (DIOVAN ) 80 MG tablet Take 1 tablet (80 mg total) by mouth daily. 09/03/24   Wonda Sharper, MD  Vitamin D , Ergocalciferol , (DRISDOL ) 1.25 MG (50000 UNIT) CAPS capsule TAKE ONE CAPSULE BY MOUTH EVERY 7 DAYS 08/13/24   Levora Reyes SAUNDERS, MD  Vonoprazan Fumarate  (VOQUEZNA ) 10 MG TABS Take 1 tablet by mouth daily. 07/14/24   Armbruster, Elspeth SQUIBB, MD  zolpidem  (AMBIEN ) 10 MG tablet TAKE 1 TABLET(10  MG) BY MOUTH AT BEDTIME 07/21/24   Levora Reyes SAUNDERS, MD  dicyclomine  (BENTYL ) 20 MG tablet Take 20 mg by mouth 3 (three) times daily before meals.    [provider]    Allergies: Desloratadine, Loratadine, Ketek [telithromycin], and Levbid  [hyoscyamine  sulfate]    Review of Systems  All other systems reviewed and are negative.   Updated Vital Signs BP (!) 149/74 (BP Location: Left Arm) Comment: Simultaneous filing. User may not have seen previous data. Comment (BP Location): Simultaneous filing. User may not have seen previous data.  Pulse (!) 135 Comment: Simultaneous filing. User may not have seen previous data.  Temp 98.4 F (36.9 C) (Oral) Comment: Simultaneous filing. User may not have seen previous data. Comment (Src): Simultaneous filing. User may not have seen previous data.  Resp 18 Comment: Simultaneous filing. User may not have seen previous data.  Ht 1.803  m (5' 11)   Wt 119.9 kg   SpO2 97% Comment: Simultaneous filing. User may not have seen previous data.  BMI 36.87 kg/m   Physical Exam Vitals and nursing note reviewed.  Constitutional:      General: He is not in acute distress.    Appearance: Normal appearance. He is well-developed. He is not toxic-appearing.  HENT:     Head: Normocephalic and atraumatic.  Eyes:     General: Lids are normal.     Conjunctiva/sclera: Conjunctivae normal.     Pupils: Pupils are equal, round, and reactive to light.  Neck:     Thyroid : No thyroid  mass.     Trachea: No tracheal deviation.  Cardiovascular:     Rate and Rhythm: Normal rate and regular rhythm.     Heart sounds: Normal heart sounds. No murmur heard.    No gallop.  Pulmonary:     Effort: Pulmonary effort is normal. No respiratory distress.     Breath sounds: Normal breath sounds. No stridor. No decreased breath sounds, wheezing, rhonchi or rales.  Abdominal:     General: There is no distension.     Palpations: Abdomen is soft.     Tenderness: There is  abdominal tenderness in the left upper quadrant and left lower quadrant. There is guarding. There is no rebound.   Musculoskeletal:        General: No tenderness. Normal range of motion.     Cervical back: Normal range of motion and neck supple.  Skin:    General: Skin is warm and dry.     Findings: No abrasion or rash.  Neurological:     Mental Status: He is alert and oriented to person, place, and time. Mental status is at baseline.     GCS: GCS eye subscore is 4. GCS verbal subscore is 5. GCS motor subscore is 6.     Cranial Nerves: No cranial nerve deficit.     Sensory: No sensory deficit.     Motor: Motor function is intact.  Psychiatric:        Attention and Perception: Attention normal.        Speech: Speech normal.        Behavior: Behavior normal.     (all labs ordered are listed, but only abnormal results are displayed) Labs Reviewed  LIPASE, BLOOD  COMPREHENSIVE METABOLIC PANEL WITH GFR  CBC  URINALYSIS, ROUTINE W REFLEX MICROSCOPIC  I-STAT CG4 LACTIC ACID, ED    EKG: None  Radiology: No results found.   Procedures   Medications Ordered in the ED  lactated ringers  bolus 1,000 mL (has no administration in time range)  lactated ringers  infusion (has no administration in time range)  morphine (PF) 4 MG/ML injection 8 mg (has no administration in time range)  ondansetron  (ZOFRAN ) injection 4 mg (has no administration in time range)                                    Medical Decision Making Amount and/or Complexity of Data Reviewed Labs: ordered. Radiology: ordered. ECG/medicine tests: ordered.  Risk Prescription drug management.   Patient here complaining of abdominal pain with fever and chills.  Mild elevation of his white count.  Abdominal CT consistent with colitis.  Patient started on Zosyn as he is now febrile and tachycardic.  Has been given pain medication as well as IV hydration.  Will give Tylenol  here.  Patient will require admission.  Will  consult hospitalist     Final diagnoses:  None    ED Discharge Orders     None          Dasie Faden, MD 10/07/24 1046

## 2024-10-07 NOTE — Progress Notes (Signed)
   10/07/24 1951  BiPAP/CPAP/SIPAP  BiPAP/CPAP/SIPAP Pt Type Adult  Reason BIPAP/CPAP not in use Non-compliant (Patient refused CPAP qhs.  Patient states that he is unable to tolerate wearing the headgear on his face.)

## 2024-10-07 NOTE — Progress Notes (Signed)
 RT unable to administer ordered nebulizer at this time- medication not verified on MAR (not listed as an option to pull on PYXIS).

## 2024-10-07 NOTE — Plan of Care (Signed)
 IVF replacement Pain management   Problem: Education: Goal: Knowledge of General Education information will improve Description: Including pain rating scale, medication(s)/side effects and non-pharmacologic comfort measures Outcome: Progressing   Problem: Health Behavior/Discharge Planning: Goal: Ability to manage health-related needs will improve Outcome: Progressing   Problem: Clinical Measurements: Goal: Ability to maintain clinical measurements within normal limits will improve Outcome: Progressing

## 2024-10-08 DIAGNOSIS — D509 Iron deficiency anemia, unspecified: Secondary | ICD-10-CM | POA: Diagnosis present

## 2024-10-08 DIAGNOSIS — Z881 Allergy status to other antibiotic agents status: Secondary | ICD-10-CM | POA: Diagnosis not present

## 2024-10-08 DIAGNOSIS — Z888 Allergy status to other drugs, medicaments and biological substances status: Secondary | ICD-10-CM | POA: Diagnosis not present

## 2024-10-08 DIAGNOSIS — Z79899 Other long term (current) drug therapy: Secondary | ICD-10-CM | POA: Diagnosis not present

## 2024-10-08 DIAGNOSIS — F32A Depression, unspecified: Secondary | ICD-10-CM | POA: Diagnosis present

## 2024-10-08 DIAGNOSIS — Z6836 Body mass index (BMI) 36.0-36.9, adult: Secondary | ICD-10-CM | POA: Diagnosis not present

## 2024-10-08 DIAGNOSIS — Z79620 Long term (current) use of immunosuppressive biologic: Secondary | ICD-10-CM | POA: Diagnosis not present

## 2024-10-08 DIAGNOSIS — Z7982 Long term (current) use of aspirin: Secondary | ICD-10-CM | POA: Diagnosis not present

## 2024-10-08 DIAGNOSIS — R000 Tachycardia, unspecified: Secondary | ICD-10-CM | POA: Diagnosis not present

## 2024-10-08 DIAGNOSIS — I110 Hypertensive heart disease with heart failure: Secondary | ICD-10-CM | POA: Diagnosis present

## 2024-10-08 DIAGNOSIS — J45909 Unspecified asthma, uncomplicated: Secondary | ICD-10-CM | POA: Diagnosis present

## 2024-10-08 DIAGNOSIS — A419 Sepsis, unspecified organism: Secondary | ICD-10-CM | POA: Diagnosis present

## 2024-10-08 DIAGNOSIS — E66812 Obesity, class 2: Secondary | ICD-10-CM | POA: Diagnosis present

## 2024-10-08 DIAGNOSIS — I5032 Chronic diastolic (congestive) heart failure: Secondary | ICD-10-CM | POA: Diagnosis present

## 2024-10-08 DIAGNOSIS — F419 Anxiety disorder, unspecified: Secondary | ICD-10-CM | POA: Diagnosis present

## 2024-10-08 DIAGNOSIS — E876 Hypokalemia: Secondary | ICD-10-CM | POA: Diagnosis present

## 2024-10-08 DIAGNOSIS — K219 Gastro-esophageal reflux disease without esophagitis: Secondary | ICD-10-CM | POA: Diagnosis present

## 2024-10-08 DIAGNOSIS — Z8249 Family history of ischemic heart disease and other diseases of the circulatory system: Secondary | ICD-10-CM | POA: Diagnosis not present

## 2024-10-08 DIAGNOSIS — I2510 Atherosclerotic heart disease of native coronary artery without angina pectoris: Secondary | ICD-10-CM | POA: Diagnosis present

## 2024-10-08 DIAGNOSIS — Z79811 Long term (current) use of aromatase inhibitors: Secondary | ICD-10-CM | POA: Diagnosis not present

## 2024-10-08 DIAGNOSIS — Z7951 Long term (current) use of inhaled steroids: Secondary | ICD-10-CM | POA: Diagnosis not present

## 2024-10-08 DIAGNOSIS — Z9049 Acquired absence of other specified parts of digestive tract: Secondary | ICD-10-CM | POA: Diagnosis not present

## 2024-10-08 DIAGNOSIS — K529 Noninfective gastroenteritis and colitis, unspecified: Secondary | ICD-10-CM | POA: Diagnosis not present

## 2024-10-08 DIAGNOSIS — E785 Hyperlipidemia, unspecified: Secondary | ICD-10-CM | POA: Diagnosis present

## 2024-10-08 DIAGNOSIS — A039 Shigellosis, unspecified: Secondary | ICD-10-CM | POA: Diagnosis present

## 2024-10-08 LAB — CBC
HCT: 47.9 % (ref 39.0–52.0)
Hemoglobin: 15.8 g/dL (ref 13.0–17.0)
MCH: 27.5 pg (ref 26.0–34.0)
MCHC: 33 g/dL (ref 30.0–36.0)
MCV: 83.3 fL (ref 80.0–100.0)
Platelets: 152 K/uL (ref 150–400)
RBC: 5.75 MIL/uL (ref 4.22–5.81)
RDW: 17.8 % — ABNORMAL HIGH (ref 11.5–15.5)
WBC: 13.3 K/uL — ABNORMAL HIGH (ref 4.0–10.5)
nRBC: 0 % (ref 0.0–0.2)

## 2024-10-08 LAB — BASIC METABOLIC PANEL WITH GFR
Anion gap: 9 (ref 5–15)
BUN: 11 mg/dL (ref 6–20)
CO2: 22 mmol/L (ref 22–32)
Calcium: 7.9 mg/dL — ABNORMAL LOW (ref 8.9–10.3)
Chloride: 101 mmol/L (ref 98–111)
Creatinine, Ser: 1.23 mg/dL (ref 0.61–1.24)
GFR, Estimated: >60 mL/min (ref >60)
Glucose, Bld: 96 mg/dL (ref 70–99)
Potassium: 3.7 mmol/L (ref 3.5–5.1)
Sodium: 131 mmol/L — ABNORMAL LOW (ref 135–145)

## 2024-10-08 LAB — C DIFFICILE QUICK SCREEN W PCR REFLEX
C Diff antigen: NEGATIVE
C Diff interpretation: NOT DETECTED
C Diff toxin: NEGATIVE

## 2024-10-08 MED ORDER — TAMSULOSIN HCL 0.4 MG PO CAPS
0.4000 mg | ORAL_CAPSULE | Freq: Every day | ORAL | Status: DC
  Administered 2024-10-08 – 2024-10-09 (×2): 0.4 mg via ORAL
  Filled 2024-10-08 (×2): qty 1

## 2024-10-08 MED ORDER — ALUM & MAG HYDROXIDE-SIMETH 200-200-20 MG/5ML PO SUSP
30.0000 mL | Freq: Four times a day (QID) | ORAL | Status: DC | PRN
  Administered 2024-10-08: 30 mL via ORAL
  Filled 2024-10-08: qty 30

## 2024-10-08 NOTE — TOC Initial Note (Signed)
 Transition of Care (TOC) - Initial/Assessment Note    Patient Details  Name: Randall Reed. MRN: [id number] Date of Birth: 04-17-1971  Transition of Care [facility]) CM/SW Contact:    Bascom Service, RN Phone Number: 10/08/2024, 2:25 PM  Clinical Narrative: d/c plan home.                  Expected Discharge Plan: Home/Self Care Barriers to Discharge: Continued Medical Work up   Patient Goals and CMS Choice Patient states their goals for this hospitalization and ongoing recovery are:: Home CMS Medicare.gov Compare Post Acute Care list provided to:: Patient Choice offered to / list presented to : Patient [place] ownership interest in [facility].provided to:: Patient    Expected Discharge Plan and Services   Discharge Planning Services: CM Consult Post Acute Care Choice: Resumption of Svcs/PTA Provider Living arrangements for the past 2 months: Single Family Home                                      Prior Living Arrangements/Services Living arrangements for the past 2 months: Single Family Home Lives with:: Spouse   Do you feel safe going back to the place where you live?: Yes               Activities of Daily Living   ADL Screening (condition at time of admission) Independently performs ADLs?: Yes (appropriate for developmental age) Is the patient deaf or have difficulty hearing?: No Does the patient have difficulty seeing, even when wearing glasses/contacts?: No Does the patient have difficulty concentrating, remembering, or making decisions?: No  Permission Sought/Granted                  Emotional Assessment              Admission diagnosis:  Colitis presumed infectious [K52.9] Colitis [K52.9] Patient Active Problem List   Diagnosis Date Noted   Colitis presumed infectious 10/07/2024   IDA (iron deficiency anemia) 10/05/2024   Acute diverticulitis 05/01/2024   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   Diverticulitis of  colon 05/01/2024   Diverticulosis 05/01/2024   Dysphagia 05/01/2024   Conjunctivitis 01/17/2024   S/P laparoscopic-assisted sigmoidectomy 10/11/2023   Cough 06/09/2023   Abdominal bloating with cramps 09/17/2022   Diverticulitis 04/23/2022   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   Fatigue 03/12/2021   Change in bowel habit 10/03/2020   Early satiety 10/03/2020   History of colonic polyps 10/03/2020   Screening for malignant neoplasm of colon 10/03/2020   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   Diarrhea 10/03/2020   Voice disturbance 03/12/2019   Coronary artery disease involving native coronary artery of native heart without angina pectoris 03/05/2019   Irritable bowel syndrome 03/14/2016   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   Asthma 11/19/2015   Sleep apnea 11/19/2015   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   rotator cuff tear 10/17/2013   Hypogonadism male 09/04/2013   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   ED (erectile dysfunction) 07/30/2013   Male erectile dysfunction, unspecified 07/30/2013   Biceps tendonitis 06/15/2013   Incomplete tear of rotator cuff 06/15/2013   Pain in upper limb 06/15/2013   Bilateral knee pain 11/07/2012   Right shoulder pain 11/07/2012   Palpitations 02/21/2012   BMI 33.0-33.9,adult 02/04/2012   Allergic rhinitis 02/04/2012   GERD (gastroesophageal reflux disease) 02/04/2012   Migraine 02/04/2012   GAD (  generalized anxiety disorder) 02/04/2012   Insomnia 02/04/2012   Obesity 02/04/2012   Gastro-esophageal reflux disease without esophagitis 02/04/2012   Essential hypertension 04/13/2009   CHEST PAIN-UNSPECIFIED 04/13/2009   Abdominal pain 04/13/2009   PCP:  Levora Reyes SAUNDERS, MD Pharmacy:   [facility]) [id number] - [place], [place] - [address] AT [address] [place] [address] Phone: [phone] Fax: [phone]  [id number] [place], [place] - [address] AT [facility] OF [address] [place] [address] Phone: [phone] Fax: [phone]  [facility] [address] [place] - [address] [place] [address] Phone: [phone] Fax: [phone]  [facility] [place], [place] - [address] [place] [address] Phone: [phone] Fax: [phone]  [facility] [address] [place], [place] - [address] AT [facility] OF [address] [place] [place] [address] Phone: [phone] Fax: [phone]  [facility] [place] - [phone] [place] [address] Phone: [phone] Fax: [phone]  [place] [facility] [address] [place] [address] Phone: [phone] Fax: [phone]     Social Drivers of Health (SDOH) Social History: SDOH Screenings   Food Insecurity: No Food Insecurity (10/07/2024)  Housing: Unknown (10/07/2024)  Transportation Needs: Patient Declined (10/07/2024)  Utilities: Not At Risk (10/07/2024)  Alcohol Screen: Low Risk  (09/01/2024)  Depression (PHQ2-9): Low Risk  (09/03/2024)  Financial Resource Strain: Low Risk  (09/01/2024)  Physical Activity: Sufficiently Active (09/01/2024)  Social Connections: Moderately Integrated (09/01/2024)  Stress: Stress Concern Present (09/01/2024)  Tobacco Use: Low Risk  (10/07/2024)   SDOH Interventions: Food Insecurity Interventions: Intervention Not Indicated   Readmission Risk Interventions     No data to display

## 2024-10-08 NOTE — Plan of Care (Signed)

## 2024-10-08 NOTE — Progress Notes (Signed)
 PROGRESS NOTE    Randall JONETTA Floria Mickey.  FMW:[id number] DOB: [birth year] DOA: 10/07/2024 PCP: Levora Reyes SAUNDERS, MD    Brief Narrative:   Randall Paullin. is a 53 y.o. male with past medical history significant for HTN, HLD, anxiety/depression, asthma, GERD, obesity, history of diverticulitis s/p sigmoidectomy (12/2023) who presented to [facility] ED on 10/07/2024 with lower abdominal pain associated with chills, nausea/vomiting/diarrhea.  Patient reports ongoing over the last 3 days.  Denies any sick contacts, no changes in medications or dietary changes other than trying to eat healthier/lose weight.  Denies recent antibiotic use.  In the ED, temperature 100.9 F, HR 135, RR 29, BP 149/74, SpO2 97% on room air.  WBC 12.4, hemoglobin 17.3, platelet count 166.  Sodium 135, potassium 4.0, chloride 103, CO2 23, glucose 119, BUN 9, creat 1.19.  Lipase 31.  AST 20, ALT 16, total bilirubin 0.8.  Lactic acid 1.6.  Urinalysis unrevealing.  CT abdomen/pelvis with contrast with slight inflammation transverse and ascending colon consistent with enteritis/colitis, stable small umbilical hernia containing fat, small right inguinal hernia containing fat, intact retrosigmoid surgical anastomosis.  Patient was given LR bolus, IV morphine  and Zofran  in the ED.  TRH consulted for admission for further evaluation and management of colitis.  Assessment & Plan:   Colitis, suspect bacterial Patient presenting with 3-day history of nausea, vomiting, diarrhea associate with lower abdominal pain and chills.  Denies any recent sick contacts, no recent dietary changes or changes to medications, no recent antibiotic use.  Patient was febrile 100.9 F on admission, tachycardic, tachypneic with elevated WBC count of 12.4.  LFTs, lipase within normal limits.  Lactic acid within normal limits.  Urinalysis unrevealing.  CT abdomen/pelvis with contrast with slight inflammation transverse and ascending colon consistent with  enteritis/colitis. -- WBC 12.4>13.3 -- Tmax 100.9 F past 24 hours -- Check GI PCR, C. difficile PCR panel -- Continue Zosyn  3.375 g IV every 8 hours -- LR at 100 mL/h -- Enteric precautions -- Regular diet -- Supportive care, antiemetics -- Strict I's and O's, monitor bowel movement -- CBC in a.m.  HTN -- Metoprolol  succinate 50 mg p.o. daily -- Hold home valsartan  for now -- Continue aspirin  and statin -- Monitor BP closely  HLD -- Crestor  10 mg p.o. daily  Iron deficiency anemia -- Hemoglobin 15.8, stable -- Ferrous sulfate  325 mg p.o. daily  Anxiety/depression -- Xanax  1 mg p.o. 3 times daily as needed anxiety  Asthma -- Breztri  2 puffs twice daily -- Montelukast  10 mg p.o. nightly -- On Dupixent every 14 days, last dose 11/21  GERD -- Protonix  40 mg p.o. daily (on Voquezna  outpatient)  Obesity, class II Body mass index is 36.87 kg/m.  Social: Continues on Descov[employer]  for PrEP   DVT prophylaxis: Lovenox     Code Status: Full Code Family Communication: No family present at bedside  Disposition Plan:  Level of care: Progressive Status is: Inpatient Remains inpatient appropriate because: IV antibiotics, IV fluid, pending C. difficile/GI PCR panel    Consultants:  None  Procedures:  None this is  Antimicrobials:  Zosyn  12/3>>   Subjective: Patient seen examined bedside, lying in bed.  Continues with volume is diarrhea, 8 episodes reported since admission.  Continues with mild cramping discomfort left lower quadrant.  Tolerating oral intake, only had some crackers this morning although.  Remains on IV antibiotics, IV fluids.  Discussed obtaining further workup including stool samples for GI PCR and C. difficile PCR panels.  No other specific  questions, complaints or concerns at this time.  Denies headache, no dizziness, no chest pain, no palpitations, no shortness of breath, no fever/chills/night sweats, no current nausea/vomiting, no focal weakness, no  fatigue, no paresthesia.  No acute events overnight per nursing.  Objective: Vitals:   10/07/24 1637 10/07/24 1945 10/07/24 2143 10/08/24 0404  BP: (!) 140/72  105/67 101/68  Pulse: (!) 118  96 (!) 102  Resp: 16  18 18   Temp: 99.4 F (37.4 C)  99 F (37.2 C) 99.6 F (37.6 C)  TempSrc: Oral  Oral Oral  SpO2: 97% 97% 95% 94%  Weight:      Height:        Intake/Output Summary (Last 24 hours) at 10/08/2024 1134 Last data filed at 10/08/2024 0600 Gross per 24 hour  Intake 2018.39 ml  Output --  Net 2018.39 ml   Filed Weights   10/07/24 0806  Weight: 119.9 kg    Examination:  Physical Exam: GEN: NAD, alert and oriented x 3, obese HEENT: NCAT, PERRL, EOMI, sclera clear, MMM PULM: CTAB w/o wheezes/crackles, normal respiratory effort, Mare CV: RRR w/o M/G/R GI: abd soft, NTND, + hyperactive bowel sounds MSK: no peripheral edema, muscle strength globally intact 5/5 bilateral upper/lower extremities NEURO: CN II-XII intact, no focal deficits, sensation to light touch intact PSYCH: normal mood/affect Integumentary: dry/intact, no rashes or wounds    Data Reviewed: I have personally reviewed following labs and imaging studies  CBC: Recent Labs  Lab 10/07/24 0812 10/08/24 0453  WBC 12.4* 13.3*  HGB 17.3* 15.8  HCT 51.3 47.9  MCV 82.3 83.3  PLT 166 152   Basic Metabolic Panel: Recent Labs  Lab 10/07/24 0812 10/08/24 0453  NA 135 131*  K 4.0 3.7  CL 103 101  CO2 23 22  GLUCOSE 119* 96  BUN 9 11  CREATININE 1.19 1.23  CALCIUM  9.3 7.9*   GFR: Estimated Creatinine Clearance: 89.4 mL/min (by C-G formula based on SCr of 1.23 mg/dL). Liver Function Tests: Recent Labs  Lab 10/07/24 0812  AST 20  ALT 16  ALKPHOS 79  BILITOT 0.8  PROT 7.0  ALBUMIN 4.4   Recent Labs  Lab 10/07/24 0812  LIPASE 31   No results for input(s): AMMONIA in the last 168 hours. Coagulation Profile: No results for input(s): INR, PROTIME in the last 168 hours. Cardiac  Enzymes: No results for input(s): CKTOTAL, CKMB, CKMBINDEX, TROPONINI in the last 168 hours. BNP (last 3 results) Recent Labs    03/18/24 0913  PROBNP <36   HbA1C: No results for input(s): HGBA1C in the last 72 hours. CBG: No results for input(s): GLUCAP in the last 168 hours. Lipid Profile: No results for input(s): CHOL, HDL, LDLCALC, TRIG, CHOLHDL, LDLDIRECT in the last 72 hours. Thyroid  Function Tests: No results for input(s): TSH, T4TOTAL, FREET4, T3FREE, THYROIDAB in the last 72 hours. Anemia Panel: No results for input(s): VITAMINB12, FOLATE, FERRITIN, TIBC, IRON, RETICCTPCT in the last 72 hours. Sepsis Labs: Recent Labs  Lab 10/07/24 0900  LATICACIDVEN 1.6    No results found for this or any previous visit (from the past 240 hours).       Radiology Studies: CT ABDOMEN PELVIS W CONTRAST Result Date: 10/07/2024 CLINICAL DATA:  Abdominal pain.  History of sigmoid colectomy. EXAM: CT ABDOMEN AND PELVIS WITH CONTRAST TECHNIQUE: Multidetector CT imaging of the abdomen and pelvis was performed using the standard protocol following bolus administration of intravenous contrast. RADIATION DOSE REDUCTION: This exam was performed according to the  departmental dose-optimization program which includes automated exposure control, adjustment of the mA and/or kV according to patient size and/or use of iterative reconstruction technique. CONTRAST:  OMNIPAQUE  IOHEXOL  300 MG/ML  SOLN COMPARISON:  05/19/2024 FINDINGS: Lower chest: No acute abnormality. Hepatobiliary: Stable benign left lobe hepatic cyst. Normal gallbladder. No biliary dilatation. Pancreas: Unremarkable. No pancreatic ductal dilatation or surrounding inflammatory changes. Spleen: Normal in size without focal abnormality. Adrenals/Urinary Tract: Adrenal glands are unremarkable. Kidneys are normal, without renal calculi, focal lesion, or hydronephrosis. Bladder is unremarkable.  Stomach/Bowel: Although decompressed, there may be some slight inflammation the transverse and ascending colon which may implicate some degree enteritis/colitis. Bowel shows no evidence of obstruction, ileus or lesion. The appendix is normal. No free intraperitoneal air. Intact and stable appearance rectosigmoid anastomosis without evidence to suggest anastomotic leak or breakdown. Vascular/Lymphatic: No significant vascular findings are present. No enlarged abdominal or pelvic lymph nodes. Reproductive: Prostate is unremarkable. Other: Small right inguinal hernia containing fat. Stable small umbilical hernia containing fat. No ascites or abscess. Musculoskeletal: No acute or significant osseous findings. IMPRESSION: 1. Although decompressed, there may be some slight inflammation of the transverse and ascending colon which may implicate some degree of enteritis/colitis. 2. Stable small umbilical hernia containing fat. 3. Small right inguinal hernia containing fat. 4. Intact rectosigmoid surgical anastomosis. Electronically Signed   By: Marcey Moan M.D.   On: 10/07/2024 10:21        Scheduled Meds:  aspirin  EC  81 mg Oral Daily   budesonide -glycopyrrolate -formoterol   2 puff Inhalation BID   dicyclomine   20 mg Oral TID AC   emtricitabine -tenofovir  AF  1 tablet Oral Daily   enoxaparin  (LOVENOX ) injection  60 mg Subcutaneous Daily   ferrous sulfate   325 mg Oral Q breakfast   fexofenadine   180 mg Oral Daily   metoprolol  succinate  50 mg Oral Daily   montelukast   10 mg Oral QHS   Olopatadine -Mometasone   1 spray Nasal BID   pantoprazole   40 mg Oral Daily   rosuvastatin   10 mg Oral Daily   Continuous Infusions:  lactated ringers  125 mL/hr at 10/08/24 0405   piperacillin -tazobactam (ZOSYN )  IV 3.375 g (10/08/24 0631)     LOS: 0 days    Time spent: 52 minutes spent on 10/08/2024 caring for this patient face-to-face including chart review, ordering labs/tests, documenting, discussion with  nursing staff, consultants, updating family and interview/physical exam    Camellia PARAS Larayah Clute, DO [facility] Available via Epic secure chat 7am-7pm After these hours, please refer to coverage provider listed on [email or web address] 10/08/2024, 11:34 AM

## 2024-10-08 NOTE — Plan of Care (Signed)
  Problem: Clinical Measurements: Goal: Ability to maintain clinical measurements within normal limits will improve Outcome: Progressing   Problem: Clinical Measurements: Goal: Diagnostic test results will improve Outcome: Progressing   Problem: Nutrition: Goal: Adequate nutrition will be maintained Outcome: Progressing   Problem: Safety: Goal: Ability to remain free from injury will improve Outcome: Progressing

## 2024-10-08 NOTE — Progress Notes (Signed)
   10/08/24 2105  BiPAP/CPAP/SIPAP  BiPAP/CPAP/SIPAP Pt Type Adult  Reason BIPAP/CPAP not in use Non-compliant (Patient continues to refuse CPAP qhs.)

## 2024-10-08 NOTE — Telephone Encounter (Signed)
 FYI no action needed  Pt has been admitted with Colitis

## 2024-10-09 DIAGNOSIS — K529 Noninfective gastroenteritis and colitis, unspecified: Secondary | ICD-10-CM | POA: Diagnosis not present

## 2024-10-09 LAB — GASTROINTESTINAL PANEL BY PCR, STOOL (REPLACES STOOL CULTURE)
Adenovirus F40/41: NOT DETECTED
Astrovirus: NOT DETECTED
Campylobacter species: NOT DETECTED
Cryptosporidium: NOT DETECTED
Cyclospora cayetanensis: NOT DETECTED
Entamoeba histolytica: NOT DETECTED
Enteroaggregative E coli (EAEC): DETECTED — AB
Enteropathogenic E coli (EPEC): NOT DETECTED
Enterotoxigenic E coli (ETEC): NOT DETECTED
Giardia lamblia: NOT DETECTED
Norovirus GI/GII: NOT DETECTED
Plesimonas shigelloides: NOT DETECTED
Rotavirus A: NOT DETECTED
Salmonella species: NOT DETECTED
Sapovirus (I, II, IV, and V): NOT DETECTED
Shiga like toxin producing E coli (STEC): NOT DETECTED
Shigella/Enteroinvasive E coli (EIEC): DETECTED — AB
Vibrio cholerae: NOT DETECTED
Vibrio species: NOT DETECTED
Yersinia enterocolitica: NOT DETECTED

## 2024-10-09 LAB — BASIC METABOLIC PANEL WITH GFR
Anion gap: 9 (ref 5–15)
BUN: 6 mg/dL (ref 6–20)
CO2: 25 mmol/L (ref 22–32)
Calcium: 8.7 mg/dL — ABNORMAL LOW (ref 8.9–10.3)
Chloride: 102 mmol/L (ref 98–111)
Creatinine, Ser: 1.29 mg/dL — ABNORMAL HIGH (ref 0.61–1.24)
GFR, Estimated: >60 mL/min (ref >60)
Glucose, Bld: 93 mg/dL (ref 70–99)
Potassium: 3.4 mmol/L — ABNORMAL LOW (ref 3.5–5.1)
Sodium: 136 mmol/L (ref 135–145)

## 2024-10-09 LAB — PHOSPHORUS: Phosphorus: 2.2 mg/dL — ABNORMAL LOW (ref 2.5–4.6)

## 2024-10-09 LAB — CBC
HCT: 46.1 % (ref 39.0–52.0)
Hemoglobin: 14.9 g/dL (ref 13.0–17.0)
MCH: 27.4 pg (ref 26.0–34.0)
MCHC: 32.3 g/dL (ref 30.0–36.0)
MCV: 84.7 fL (ref 80.0–100.0)
Platelets: 151 K/uL (ref 150–400)
RBC: 5.44 MIL/uL (ref 4.22–5.81)
RDW: 17 % — ABNORMAL HIGH (ref 11.5–15.5)
WBC: 8.8 K/uL (ref 4.0–10.5)
nRBC: 0 % (ref 0.0–0.2)

## 2024-10-09 LAB — MAGNESIUM: Magnesium: 2.3 mg/dL (ref 1.7–2.4)

## 2024-10-09 MED ORDER — SODIUM PHOSPHATES 45 MMOLE/15ML IV SOLN
15.0000 mmol | Freq: Once | INTRAVENOUS | Status: AC
  Administered 2024-10-09: 15 mmol via INTRAVENOUS
  Filled 2024-10-09: qty 5

## 2024-10-09 MED ORDER — POTASSIUM CHLORIDE CRYS ER 20 MEQ PO TBCR
30.0000 meq | EXTENDED_RELEASE_TABLET | ORAL | Status: AC
  Administered 2024-10-09 (×2): 30 meq via ORAL
  Filled 2024-10-09 (×2): qty 1

## 2024-10-09 MED ORDER — CIPROFLOXACIN HCL 500 MG PO TABS
500.0000 mg | ORAL_TABLET | Freq: Two times a day (BID) | ORAL | Status: DC
  Administered 2024-10-09 – 2024-10-10 (×2): 500 mg via ORAL
  Filled 2024-10-09 (×2): qty 1

## 2024-10-09 NOTE — Plan of Care (Signed)

## 2024-10-09 NOTE — Progress Notes (Signed)
   10/09/24 2339  BiPAP/CPAP/SIPAP  Reason BIPAP/CPAP not in use Non-compliant (PATIENT DOES NOT WEAR. NOT IN ROOM)

## 2024-10-09 NOTE — Progress Notes (Signed)
 PROGRESS NOTE    Randall Reed.  FMW:[id number] DOB: 1971/09/25 DOA: 10/07/2024 PCP: Levora Reyes SAUNDERS, MD    Brief Narrative:   Randall Reed. is a 53 y.o. male with past medical history significant for HTN, HLD, anxiety/depression, asthma, GERD, obesity, history of diverticulitis s/p sigmoidectomy (12/2023) who presented to [facility] ED on 10/07/2024 with lower abdominal pain associated with chills, nausea/vomiting/diarrhea.  Patient reports ongoing over the last 3 days.  Denies any sick contacts, no changes in medications or dietary changes other than trying to eat healthier/lose weight.  Denies recent antibiotic use.  In the ED, temperature 100.9 F, HR 135, RR 29, BP 149/74, SpO2 97% on room air.  WBC 12.4, hemoglobin 17.3, platelet count 166.  Sodium 135, potassium 4.0, chloride 103, CO2 23, glucose 119, BUN 9, creat 1.19.  Lipase 31.  AST 20, ALT 16, total bilirubin 0.8.  Lactic acid 1.6.  Urinalysis unrevealing.  CT abdomen/pelvis with contrast with slight inflammation transverse and ascending colon consistent with enteritis/colitis, stable small umbilical hernia containing fat, small right inguinal hernia containing fat, intact retrosigmoid surgical anastomosis.  Patient was given LR bolus, IV morphine  and Zofran  in the ED.  TRH consulted for admission for further evaluation and management of colitis.  Assessment & Plan:   Colitis, suspect bacterial Patient presenting with 3-day history of nausea, vomiting, diarrhea associate with lower abdominal pain and chills.  Denies any recent sick contacts, no recent dietary changes or changes to medications, no recent antibiotic use.  Patient was febrile 100.9 F on admission, tachycardic, tachypneic with elevated WBC count of 12.4.  LFTs, lipase within normal limits.  Lactic acid within normal limits.  Urinalysis unrevealing.  CT abdomen/pelvis with contrast with slight inflammation transverse and ascending colon consistent with  enteritis/colitis. -- WBC 12.4>13.3>8.8 -- Tmax 98.9 F past 24 hours -- C. difficile PCR: Negative -- GI PCR panel: Pending -- Continue Zosyn  3.375 g IV every 8 hours -- LR at 100 mL/h -- Enteric precautions -- Regular diet -- Supportive care, antiemetics -- Strict I's and O's, monitor bowel movements -- CBC in a.m.  Hypokalemia Etiology likely secondary to GI loss from diarrhea.  Rebleeding. -- Repeat electrolytes in a.m.  Hypophosphatemia Phosphorus 2.2, will replete. -- Repeat phosphorus level in a.m.  HTN -- Metoprolol  succinate 50 mg p.o. daily -- Hold home valsartan  for now -- Continue aspirin  and statin -- Monitor BP closely  HLD -- Crestor  10 mg p.o. daily  Iron deficiency anemia -- Hemoglobin 15.8, stable -- Ferrous sulfate  325 mg p.o. daily  Anxiety/depression -- Xanax  1 mg p.o. 3 times daily as needed anxiety  Asthma -- Breztri  2 puffs twice daily -- Montelukast  10 mg p.o. nightly -- On Dupixent every 14 days, last dose 11/21  GERD -- Protonix  40 mg p.o. daily (on Voquezna  outpatient)  Obesity, class II Body mass index is 36.87 kg/m.  Social: Continues on [employer]  for PrEP   DVT prophylaxis: Lovenox     Code Status: Full Code Family Communication: No family present at bedside  Disposition Plan:  Level of care: Progressive Status is: Inpatient Remains inpatient appropriate because: IV antibiotics, IV fluid, pending GI PCR panel; improvement of diarrhea and increased oral intake    Consultants:  None  Procedures:  None   Antimicrobials:  Zosyn  12/3>>   Subjective: Patient seen examined bedside, lying in bed.  Patient reports diarrhea improved, less watery.  Concerned about unable to completely void with urination.  C. difficile PCR negative, awaiting  GI PCR panel.  Remains on IV Zosyn , IV fluids.  Oral intake slowly improving.  No other specific questions, complaints or concerns at this time.  Denies headache, no dizziness, no chest  pain, no palpitations, no shortness of breath, no fever/chills/night sweats, no current nausea/vomiting, no focal weakness, no fatigue, no paresthesia.  No acute events overnight per nursing.  Objective: Vitals:   10/08/24 2101 10/09/24 0507 10/09/24 0828 10/09/24 0949  BP: 124/72 112/70  115/74  Pulse: 90 91  88  Resp: 20 20    Temp: 98.9 F (37.2 C) 98.8 F (37.1 C)    TempSrc: Oral Oral    SpO2: 95% 94% 94%   Weight:      Height:        Intake/Output Summary (Last 24 hours) at 10/09/2024 1001 Last data filed at 10/08/2024 1443 Gross per 24 hour  Intake 1119.68 ml  Output --  Net 1119.68 ml   Filed Weights   10/07/24 0806  Weight: 119.9 kg    Examination:  Physical Exam: GEN: NAD, alert and oriented x 3, obese HEENT: NCAT, PERRL, EOMI, sclera clear, MMM PULM: CTAB w/o wheezes/crackles, normal respiratory effort, on room air CV: RRR w/o M/G/R GI: abd soft, NTND, + BS MSK: no peripheral edema, muscle strength globally intact 5/5 bilateral upper/lower extremities NEURO: CN II-XII intact, no focal deficits, sensation to light touch intact PSYCH: normal mood/affect Integumentary: dry/intact, no rashes or wounds    Data Reviewed: I have personally reviewed following labs and imaging studies  CBC: Recent Labs  Lab 10/07/24 0812 10/08/24 0453 10/09/24 0525  WBC 12.4* 13.3* 8.8  HGB 17.3* 15.8 14.9  HCT 51.3 47.9 46.1  MCV 82.3 83.3 84.7  PLT 166 152 151   Basic Metabolic Panel: Recent Labs  Lab 10/07/24 0812 10/08/24 0453 10/09/24 0525  NA 135 131* 136  K 4.0 3.7 3.4*  CL 103 101 102  CO2 23 22 25   GLUCOSE 119* 96 93  BUN 9 11 6   CREATININE 1.19 1.23 1.29*  CALCIUM  9.3 7.9* 8.7*  MG  --   --  2.3  PHOS  --   --  2.2*   GFR: Estimated Creatinine Clearance: 85.2 mL/min (A) (by C-G formula based on SCr of 1.29 mg/dL (H)). Liver Function Tests: Recent Labs  Lab 10/07/24 0812  AST 20  ALT 16  ALKPHOS 79  BILITOT 0.8  PROT 7.0  ALBUMIN 4.4    Recent Labs  Lab 10/07/24 0812  LIPASE 31   No results for input(s): AMMONIA in the last 168 hours. Coagulation Profile: No results for input(s): INR, PROTIME in the last 168 hours. Cardiac Enzymes: No results for input(s): CKTOTAL, CKMB, CKMBINDEX, TROPONINI in the last 168 hours. BNP (last 3 results) Recent Labs    03/18/24 0913  PROBNP <36   HbA1C: No results for input(s): HGBA1C in the last 72 hours. CBG: No results for input(s): GLUCAP in the last 168 hours. Lipid Profile: No results for input(s): CHOL, HDL, LDLCALC, TRIG, CHOLHDL, LDLDIRECT in the last 72 hours. Thyroid  Function Tests: No results for input(s): TSH, T4TOTAL, FREET4, T3FREE, THYROIDAB in the last 72 hours. Anemia Panel: No results for input(s): VITAMINB12, FOLATE, FERRITIN, TIBC, IRON, RETICCTPCT in the last 72 hours. Sepsis Labs: Recent Labs  Lab 10/07/24 0900  LATICACIDVEN 1.6    Recent Results (from the past 240 hours)  C Difficile Quick Screen w PCR reflex     Status: None   Collection Time: 10/08/24 12:46 PM  Specimen: STOOL  Result Value Ref Range Status   C Diff antigen NEGATIVE NEGATIVE Final   C Diff toxin NEGATIVE NEGATIVE Final   C Diff interpretation No C. difficile detected.  Final    Comment: Performed at [facility], [address] [place], [place] [address]         Radiology Studies: No results found.       Scheduled Meds:  aspirin  EC  81 mg Oral Daily   budesonide -glycopyrrolate -formoterol   2 puff Inhalation BID   dicyclomine   20 mg Oral TID AC   emtricitabine -tenofovir  AF  1 tablet Oral Daily   enoxaparin  (LOVENOX ) injection  60 mg Subcutaneous Daily   ferrous sulfate   325 mg Oral Q breakfast   fexofenadine   180 mg Oral Daily   metoprolol  succinate  50 mg Oral Daily   montelukast   10 mg Oral QHS   Olopatadine -Mometasone   1 spray Nasal BID   pantoprazole   40 mg Oral Daily   potassium  chloride  30 mEq Oral Q4H   rosuvastatin   10 mg Oral Daily   tamsulosin   0.4 mg Oral QPC supper   Continuous Infusions:  lactated ringers  100 mL/hr at 10/09/24 9177   piperacillin -tazobactam (ZOSYN )  IV 3.375 g (10/09/24 0626)   sodium PHOSPHATE  IVPB (in mmol) 15 mmol (10/09/24 0954)     LOS: 1 day    Time spent: 41 minutes spent on 10/09/2024 caring for this patient face-to-face including chart review, ordering labs/tests, documenting, discussion with nursing staff, consultants, updating family and interview/physical exam    Camellia PARAS Sariyah Corcino, DO [facility] Available via Epic secure chat 7am-7pm After these hours, please refer to coverage provider listed on [email or web address] 10/09/2024, 10:01 AM

## 2024-10-09 NOTE — Progress Notes (Signed)
 MD updated that Patient is oozing blood from right lower abd where a subcutaneous Heparin  shot was give. Dressing applied and will monitor site closely

## 2024-10-10 ENCOUNTER — Encounter: Payer: Self-pay | Admitting: Gastroenterology

## 2024-10-10 ENCOUNTER — Other Ambulatory Visit (HOSPITAL_COMMUNITY): Payer: Self-pay

## 2024-10-10 DIAGNOSIS — R000 Tachycardia, unspecified: Secondary | ICD-10-CM | POA: Diagnosis not present

## 2024-10-10 DIAGNOSIS — K529 Noninfective gastroenteritis and colitis, unspecified: Secondary | ICD-10-CM | POA: Diagnosis not present

## 2024-10-10 LAB — BASIC METABOLIC PANEL WITH GFR
Anion gap: 8 (ref 5–15)
BUN: <5 mg/dL — ABNORMAL LOW (ref 6–20)
CO2: 25 mmol/L (ref 22–32)
Calcium: 8.7 mg/dL — ABNORMAL LOW (ref 8.9–10.3)
Chloride: 106 mmol/L (ref 98–111)
Creatinine, Ser: 1 mg/dL (ref 0.61–1.24)
GFR, Estimated: >60 mL/min (ref >60)
Glucose, Bld: 98 mg/dL (ref 70–99)
Potassium: 3.5 mmol/L (ref 3.5–5.1)
Sodium: 140 mmol/L (ref 135–145)

## 2024-10-10 LAB — CBC
HCT: 44.3 % (ref 39.0–52.0)
Hemoglobin: 14.3 g/dL (ref 13.0–17.0)
MCH: 27.4 pg (ref 26.0–34.0)
MCHC: 32.3 g/dL (ref 30.0–36.0)
MCV: 84.9 fL (ref 80.0–100.0)
Platelets: 143 K/uL — ABNORMAL LOW (ref 150–400)
RBC: 5.22 MIL/uL (ref 4.22–5.81)
RDW: 16.6 % — ABNORMAL HIGH (ref 11.5–15.5)
WBC: 6.1 K/uL (ref 4.0–10.5)
nRBC: 0 % (ref 0.0–0.2)

## 2024-10-10 LAB — MAGNESIUM: Magnesium: 2.1 mg/dL (ref 1.7–2.4)

## 2024-10-10 LAB — PHOSPHORUS: Phosphorus: 3 mg/dL (ref 2.5–4.6)

## 2024-10-10 MED ORDER — ONDANSETRON 4 MG PO TBDP
4.0000 mg | ORAL_TABLET | Freq: Three times a day (TID) | ORAL | 0 refills | Status: DC | PRN
  Filled 2024-10-10: qty 20, 7d supply, fill #0

## 2024-10-10 MED ORDER — CIPROFLOXACIN HCL 500 MG PO TABS
500.0000 mg | ORAL_TABLET | Freq: Two times a day (BID) | ORAL | 0 refills | Status: DC
  Filled 2024-10-10: qty 20, 10d supply, fill #0

## 2024-10-10 MED ORDER — GUAIFENESIN ER 600 MG PO TB12
600.0000 mg | ORAL_TABLET | Freq: Two times a day (BID) | ORAL | 0 refills | Status: DC
  Filled 2024-10-10: qty 20, 10d supply, fill #0

## 2024-10-10 NOTE — Progress Notes (Signed)
 TOC meds to bed delivered to patient by this RN

## 2024-10-10 NOTE — Plan of Care (Signed)

## 2024-10-10 NOTE — Discharge Summary (Signed)
 Physician Discharge Summary  Randall Reed. FMW:[id number] DOB: 09-07-1971 DOA: 10/07/2024  PCP: Levora Reyes SAUNDERS, MD  Admit date: 10/07/2024 Discharge date: 10/10/2024  Admitted From: Home Disposition: Home  Recommendations for Outpatient Follow-up:  Follow up with PCP in 1-2 weeks Continue ciprofloxacin  to complete antibiotic course for Shigella/E. Coli (EAEC/EIEC) infection  Home Health: No Equipment/Devices: None  Discharge Condition: Stable CODE STATUS: Full code Diet recommendation: Heart healthy diet  History of present illness:  Randall Bonn. is a 53 y.o. male with past medical history significant for HTN, HLD, anxiety/depression, asthma, GERD, obesity, history of diverticulitis s/p sigmoidectomy (12/2023) who presented to [facility] ED on 10/07/2024 with lower abdominal pain associated with chills, nausea/vomiting/diarrhea.  Patient reports ongoing over the last 3 days.  Denies any sick contacts, no changes in medications or dietary changes other than trying to eat healthier/lose weight.  Denies recent antibiotic use.   In the ED, temperature 100.9 F, HR 135, RR 29, BP 149/74, SpO2 97% on room air.  WBC 12.4, hemoglobin 17.3, platelet count 166.  Sodium 135, potassium 4.0, chloride 103, CO2 23, glucose 119, BUN 9, creat 1.19.  Lipase 31.  AST 20, ALT 16, total bilirubin 0.8.  Lactic acid 1.6.  Urinalysis unrevealing.  CT abdomen/pelvis with contrast with slight inflammation transverse and ascending colon consistent with enteritis/colitis, stable small umbilical hernia containing fat, small right inguinal hernia containing fat, intact retrosigmoid surgical anastomosis.  Patient was given LR bolus, IV morphine  and Zofran  in the ED.  TRH consulted for admission for further evaluation and management of colitis.  Hospital course:  Colitis, suspect bacterial Patient presenting with 3-day history of nausea, vomiting, diarrhea associate with lower abdominal pain and chills.  Denies  any recent sick contacts, no recent dietary changes or changes to medications, no recent antibiotic use.  Patient was febrile 100.9 F on admission, tachycardic, tachypneic with elevated WBC count of 12.4.  LFTs, lipase within normal limits.  Lactic acid within normal limits.  Urinalysis unrevealing.  CT abdomen/pelvis with contrast with slight inflammation transverse and ascending colon consistent with enteritis/colitis.  Patient was started on IV Zosyn .  C. difficile PCR panel returned back negative.  GI PCR panel positive for Shigella and E. Coli (EAEC/EIEC) infection.  WBC count improved/normalized and patient afebrile past 48 hours, now transitioned to ciprofloxacin  500 mg p.o. twice daily to complete 10-day course.  Discussed hygiene on discharge and precautions.  Outpatient follow-up with PCP.   Hypokalemia Etiology likely secondary to GI loss from diarrhea.  Repleted.   Hypophosphatemia Repleted.   HTN Continue metoprolol , valsartan .  Continue aspirin  and statin.   HLD Crestor  10 mg p.o. daily   Iron deficiency anemia Hemoglobin 14.3, stable.continue ferrous sulfate  325 mg p.o. daily   Anxiety/depression Xanax  1 mg p.o. 3 times daily as needed anxiety   Asthma Continue Breztri  2 puffs twice daily, Montelukast  10 mg p.o. nightly, Dupixent every 14 days, last dose 11/21   GERD Continue Voquezna    Obesity, class II Body mass index is 36.87 kg/m.   Social: Continues on Descovy  for PrEP  Discharge Diagnoses:  Principal Problem:   Colitis presumed infectious    Discharge Instructions  Discharge Instructions     Call MD for:  difficulty breathing, headache or visual disturbances   Complete by: As directed    Call MD for:  extreme fatigue   Complete by: As directed    Call MD for:  persistant dizziness or light-headedness   Complete by: As  directed    Call MD for:  persistant nausea and vomiting   Complete by: As directed    Call MD for:  severe uncontrolled pain    Complete by: As directed    Call MD for:  temperature >100.4   Complete by: As directed    Diet - low sodium heart healthy   Complete by: As directed    Increase activity slowly   Complete by: As directed       Allergies as of 10/10/2024       Reactions   Desloratadine Other (See Comments)   CLARINEX-severe headache CLARITIN - severe headache   Loratadine  Other (See Comments)   severe headache   Ketek [telithromycin] Itching, Rash   Levbid  [hyoscyamine  Sulfate] Rash        Medication List     TAKE these medications    Airsupra  90-80 MCG/ACT Aero Generic drug: Albuterol -Budesonide  Inhale 2 puffs into the lungs every 4 (four) hours as needed. What changed: when to take this   albuterol  108 (90 Base) MCG/ACT inhaler Commonly known as: VENTOLIN  HFA INHALE 2 PUFFS INTO THE LUNGS EVERY 6 HOURS AS NEEDED FOR WHEEZING OR SHORTNESS OF BREATH   Allegra  Allergy 180 MG tablet Generic drug: fexofenadine  Take 180 mg by mouth daily.   ALPRAZolam  1 MG tablet Commonly known as: XANAX  TAKE 1 TABLET BY MOUTH TWICE DAILY AS NEEDED What changed: reasons to take this   anastrozole 1 MG tablet Commonly known as: ARIMIDEX Take 0.5 mg by mouth once a week.   aspirin  EC 81 MG tablet Take 1 tablet (81 mg total) by mouth daily.   Breztri  Aerosphere 160-9-4.8 MCG/ACT Aero inhaler Generic drug: budesonide -glycopyrrolate -formoterol  Inhale 2 puffs into the lungs in the morning and at bedtime.   budesonide  0.5 MG/2ML nebulizer solution Commonly known as: PULMICORT  Take 0.5 mg by nebulization 2 (two) times daily as needed (wheezing / sob).   ciprofloxacin  500 MG tablet Commonly known as: Cipro  Take 1 tablet (500 mg total) by mouth 2 (two) times daily for 10 days.   cyanocobalamin  1000 MCG tablet Commonly known as: VITAMIN B12 Take 1 tablet (1,000 mcg total) by mouth daily.   dicyclomine  20 MG tablet Commonly known as: BENTYL  Take 1 tablet (20 mg total) by mouth in the  morning and at bedtime. What changed:  when to take this reasons to take this   doxycycline  100 MG tablet Commonly known as: VIBRA -TABS Take 2 tablets (200 mg total) by mouth daily as needed. What changed: reasons to take this   Dupixent 300 MG/2ML prefilled syringe Generic drug: dupilumab Inject 300 mg into the skin every 14 (fourteen) days.   emtricitabine -tenofovir  200-300 MG tablet Commonly known as: TRUVADA  TAKE 1 TABLET BY MOUTH DAILY   famotidine 40 MG tablet Commonly known as: PEPCID Take 40 mg by mouth daily as needed for heartburn or indigestion.   FeroSul 325 (65 Fe) MG tablet Generic drug: ferrous sulfate  TAKE 1 TABLET(325 MG) BY MOUTH DAILY WITH BREAKFAST   furosemide  20 MG tablet Commonly known as: LASIX  Take 1 tablet (20 mg total) by mouth daily. As needed for edema What changed:  when to take this reasons to take this additional instructions   guaiFENesin  600 MG 12 hr tablet Commonly known as: Mucinex  Take 1 tablet (600 mg total) by mouth 2 (two) times daily for 14 days.   ipratropium 0.06 % nasal spray Commonly known as: ATROVENT  Place 2 sprays into the nose 2 (two) times daily.   metoprolol   succinate 50 MG 24 hr tablet Commonly known as: TOPROL -XL Take 1 tablet (50 mg total) by mouth daily. Take with or immediately following a meal. What changed: when to take this   mirabegron ER 25 MG Tb24 tablet Commonly known as: MYRBETRIQ Take 25 mg by mouth daily as needed (bladder spasm).   montelukast  10 MG tablet Commonly known as: SINGULAIR  TAKE 1 TABLET(10 MG) BY MOUTH AT BEDTIME   olopatadine  0.1 % ophthalmic solution Commonly known as: Pataday  Place 1 drop into both eyes 2 (two) times daily as needed for allergies.   ondansetron  4 MG disintegrating tablet Commonly known as: ZOFRAN -ODT Take 1 tablet (4 mg total) by mouth every 8 (eight) hours as needed for nausea or vomiting.   pantoprazole  40 MG tablet Commonly known as: PROTONIX  Take 1  tablet (40 mg total) by mouth daily. What changed:  when to take this reasons to take this   Potassium Chloride  ER 20 MEQ Tbcr TAKE 1 TABLET(20 MEQ) BY MOUTH DAILY   rizatriptan  10 MG disintegrating tablet Commonly known as: MAXALT -MLT DISSOLVE 1 TABLET IN MOUTH AS NEEDED FOR HEADACHE. REPEAT  IN  2  HOURS  AS  NEEDED.   rosuvastatin  10 MG tablet Commonly known as: CRESTOR  Take 1 tablet (10 mg total) by mouth daily.   [name]  334-74 MCG/ACT Susp Generic drug: Olopatadine -Mometasone  Place 1 spray into the nose in the morning and at bedtime.   sildenafil  100 MG tablet Commonly known as: VIAGRA  Take 1/2-1 tablet by mouth once daily as needed prior to sexual activity *MUST KEEP UPCOMING APPT*   tadalafil  5 MG tablet Commonly known as: CIALIS  Take 5 mg by mouth daily at 12 noon.   tamsulosin  0.4 MG Caps capsule Commonly known as: FLOMAX  TAKE 1 CAPSULE(0.4 MG) BY MOUTH DAILY AS NEEDED What changed: See the new instructions.   testosterone  cypionate 200 MG/ML injection Commonly known as: DEPOTESTOSTERONE CYPIONATE Inject 1 mL into the muscle once a week.   valsartan  80 MG tablet Commonly known as: DIOVAN  Take 1 tablet (80 mg total) by mouth daily.   Vitamin D  (Ergocalciferol ) 1.25 MG (50000 UNIT) Caps capsule Commonly known as: DRISDOL  TAKE ONE CAPSULE BY MOUTH EVERY 7 DAYS What changed: additional instructions   Voquezna  10 MG Tabs Generic drug: Vonoprazan Fumarate  Take 1 tablet by mouth daily.   zolpidem  10 MG tablet Commonly known as: AMBIEN  TAKE 1 TABLET(10 MG) BY MOUTH AT BEDTIME        Follow-up Information     Levora Reyes SAUNDERS, MD. Schedule an appointment as soon as possible for a visit in 1 week(s).   Specialties: Family Medicine, Sports Medicine Contact information: [address] [place] [address] [phone]                Allergies  Allergen Reactions   Desloratadine Other (See Comments)    CLARINEX-severe headache CLARITIN -  severe headache   Loratadine  Other (See Comments)    severe headache   Ketek [Telithromycin] Itching and Rash   Levbid  [Hyoscyamine  Sulfate] Rash    Consultations: None   Procedures/Studies: CT ABDOMEN PELVIS W CONTRAST Result Date: 10/07/2024 CLINICAL DATA:  Abdominal pain.  History of sigmoid colectomy. EXAM: CT ABDOMEN AND PELVIS WITH CONTRAST TECHNIQUE: Multidetector CT imaging of the abdomen and pelvis was performed using the standard protocol following bolus administration of intravenous contrast. RADIATION DOSE REDUCTION: This exam was performed according to the departmental dose-optimization program which includes automated exposure control, adjustment of the mA and/or kV according to  patient size and/or use of iterative reconstruction technique. CONTRAST:  OMNIPAQUE  IOHEXOL  300 MG/ML  SOLN COMPARISON:  05/19/2024 FINDINGS: Lower chest: No acute abnormality. Hepatobiliary: Stable benign left lobe hepatic cyst. Normal gallbladder. No biliary dilatation. Pancreas: Unremarkable. No pancreatic ductal dilatation or surrounding inflammatory changes. Spleen: Normal in size without focal abnormality. Adrenals/Urinary Tract: Adrenal glands are unremarkable. Kidneys are normal, without renal calculi, focal lesion, or hydronephrosis. Bladder is unremarkable. Stomach/Bowel: Although decompressed, there may be some slight inflammation the transverse and ascending colon which may implicate some degree enteritis/colitis. Bowel shows no evidence of obstruction, ileus or lesion. The appendix is normal. No free intraperitoneal air. Intact and stable appearance rectosigmoid anastomosis without evidence to suggest anastomotic leak or breakdown. Vascular/Lymphatic: No significant vascular findings are present. No enlarged abdominal or pelvic lymph nodes. Reproductive: Prostate is unremarkable. Other: Small right inguinal hernia containing fat. Stable small umbilical hernia containing fat. No ascites or  abscess. Musculoskeletal: No acute or significant osseous findings. IMPRESSION: 1. Although decompressed, there may be some slight inflammation of the transverse and ascending colon which may implicate some degree of enteritis/colitis. 2. Stable small umbilical hernia containing fat. 3. Small right inguinal hernia containing fat. 4. Intact rectosigmoid surgical anastomosis. Electronically Signed   By: Marcey Moan M.D.   On: 10/07/2024 10:21   ECHOCARDIOGRAM COMPLETE Result Date: 09/24/2024    ECHOCARDIOGRAM REPORT   Patient Name:   Peniel Hass. Date of Exam: 09/24/2024 Medical Rec #:  [id number]           Height:       71.0 in Accession #:    [id number]          Weight:       264.2 lb Date of Birth:  1971/05/13            BSA:          2.374 m Patient Age:    53 years            BP:           111/71 mmHg Patient Gender: M                   HR:           62 bpm. Exam Location:  [address] Procedure: 2D Echo, 3D Echo, Cardiac Doppler, Color Doppler and Strain Analysis            (Both Spectral and Color Flow Doppler were utilized during            procedure). Indications:    R07.2 Precordial pain; R06.9 DOE; R60.0 Lower extremity edema;                 R53.83 Fatigue  History:        Patient has prior history of Echocardiogram examinations, most                 recent 03/04/2024. CAD, Signs/Symptoms:Chest Pain, Dyspnea,                 Fatigue and Edema; Risk Factors:Hypertension, Sleep Apnea and                 Non-Smoker. Patient complains of intermittent chest pains, DOE,                 overall fatigue and persisent leg swelling.  Sonographer:    Annabella Cater RVT, RDCS (AE), RDMS Referring Phys: [id number] MICHAEL COOPER IMPRESSIONS  1. Left  ventricular ejection fraction, by estimation, is 50 to 55%. Left ventricular ejection fraction by 3D volume is 55 %. The left ventricle has low normal function. The left ventricle has no regional wall motion abnormalities. Left ventricular diastolic parameters are  consistent with Grade I diastolic dysfunction (impaired relaxation). The average left ventricular global longitudinal strain is -15.3 %. The global longitudinal strain is abnormal.  2. Right ventricular systolic function is normal. The right ventricular size is normal.  3. 2.2 x 2.5 cm echolucency in the left lobe of the liver suggests a complex cyst.  4. The mitral valve is normal in structure. No evidence of mitral valve regurgitation. No evidence of mitral stenosis.  5. The aortic valve is tricuspid. Aortic valve regurgitation is not visualized. No aortic stenosis is present.  6. Aortic dilatation noted. There is borderline dilatation of the aortic root, measuring 39 mm.  7. The inferior vena cava is normal in size with greater than 50% respiratory variability, suggesting right atrial pressure of 3 mmHg. Comparison(s): No significant change from prior study. Prior images reviewed side by side. EF 50%, global hypokinesis, GLS -17.9%, asc AOR 40 mm. FINDINGS  Left Ventricle: Left ventricular ejection fraction, by estimation, is 50 to 55%. Left ventricular ejection fraction by 3D volume is 55 %. The left ventricle has low normal function. The left ventricle has no regional wall motion abnormalities. The average left ventricular global longitudinal strain is -15.3 %. Strain was performed and the global longitudinal strain is abnormal. The left ventricular internal cavity size was normal in size. There is no left ventricular hypertrophy. Left ventricular diastolic parameters are consistent with Grade I diastolic dysfunction (impaired relaxation). Normal left ventricular filling pressure. Right Ventricle: The right ventricular size is normal. No increase in right ventricular wall thickness. Right ventricular systolic function is normal. Left Atrium: Left atrial size was normal in size. Right Atrium: Right atrial size was normal in size. Pericardium: 2.2 x 2.5 cm echolucency in the left lobe of the liver suggests a  complex cyst. There is no evidence of pericardial effusion. Mitral Valve: The mitral valve is normal in structure. No evidence of mitral valve regurgitation. No evidence of mitral valve stenosis. Tricuspid Valve: The tricuspid valve is normal in structure. Tricuspid valve regurgitation is not demonstrated. No evidence of tricuspid stenosis. Aortic Valve: The aortic valve is tricuspid. Aortic valve regurgitation is not visualized. No aortic stenosis is present. Aortic valve mean gradient measures 3.0 mmHg. Aortic valve peak gradient measures 6.2 mmHg. Aortic valve area, by VTI measures 2.41 cm. Pulmonic Valve: The pulmonic valve was normal in structure. Pulmonic valve regurgitation is trivial. No evidence of pulmonic stenosis. Aorta: Aortic dilatation noted. There is borderline dilatation of the aortic root, measuring 39 mm. Venous: The inferior vena cava is normal in size with greater than 50% respiratory variability, suggesting right atrial pressure of 3 mmHg. IAS/Shunts: No atrial level shunt detected by color flow Doppler. Additional Comments: 3D was performed not requiring image post processing on an independent workstation and was normal.  LEFT VENTRICLE PLAX 2D LVIDd:         5.33 cm         Diastology LVIDs:         3.91 cm         LV e' medial:    7.05 cm/s LV PW:         1.12 cm         LV E/e' medial:  10.3 LV IVS:  0.86 cm         LV e' lateral:   9.97 cm/s LVOT diam:     2.43 cm         LV E/e' lateral: 7.3 LV SV:         57 LV SV Index:   24              2D Longitudinal LVOT Area:     4.64 cm        Strain                                2D Strain GLS   -15.3 %                                Avg:                                 3D Volume EF                                LV 3D EF:    Left                                             ventricul                                             ar                                             ejection                                             fraction                                              by 3D                                             volume is                                             55 %.                                 3D Volume EF:  3D EF:        55 %                                LV EDV:       130 ml                                LV ESV:       58 ml                                LV SV:        71 ml RIGHT VENTRICLE RV S prime:     11.50 cm/s  PULMONARY VEINS TAPSE (M-mode): 2.4 cm      Diastolic Velocity: 47.50 cm/s                             S/D Velocity:       1.30                             Systolic Velocity:  63.60 cm/s LEFT ATRIUM             Index        RIGHT ATRIUM           Index LA diam:        3.51 cm 1.48 cm/m   RA Area:     15.50 cm LA Vol (A2C):   31.6 ml 13.31 ml/m  RA Volume:   43.60 ml  18.37 ml/m LA Vol (A4C):   53.7 ml 22.62 ml/m LA Biplane Vol: 42.4 ml 17.86 ml/m  AORTIC VALVE                    PULMONIC VALVE AV Area (Vmax):    2.58 cm     PV Vmax:       1.11 m/s AV Area (Vmean):   2.28 cm     PV Peak grad:  5.0 mmHg AV Area (VTI):     2.41 cm AV Vmax:           124.00 cm/s AV Vmean:          82.800 cm/s AV VTI:            0.235 m AV Peak Grad:      6.2 mmHg AV Mean Grad:      3.0 mmHg LVOT Vmax:         68.90 cm/s LVOT Vmean:        40.700 cm/s LVOT VTI:          0.122 m LVOT/AV VTI ratio: 0.52  AORTA Ao Root diam: 3.88 cm Ao Asc diam:  3.53 cm Ao Arch diam: 3.3 cm MITRAL VALVE MV Area (PHT): 3.85 cm    SHUNTS MV Decel Time: 197 msec    Systemic VTI:  0.12 m MV E velocity: 72.40 cm/s  Systemic Diam: 2.43 cm MV A velocity: 86.80 cm/s MV E/A ratio:  0.83 Mihai Croitoru MD Electronically signed by Jerel Balding MD Signature Date/Time: 09/24/2024/2:32:37 PM    Final      Subjective: Patient seen examined bedside, lying in bed.  Diarrhea improved.  Remains afebrile past 48 hours.  Transition to ciprofloxacin  yesterday.  Discussed GI PCR panel findings with patient.  Ready for discharge  home.  Denies headache, no dizziness, no chest pain, no palpitations, no shortness of breath, no abdominal pain, no fever/chills/night sweats, no nausea/vomiting, no focal weakness, no fatigue, no paresthesias.  No acute events overnight per nurse staff.  Discharge Exam: Vitals:   10/10/24 0548 10/10/24 0745  BP: 127/76   Pulse: 71   Resp: 17   Temp: 98.7 F (37.1 C)   SpO2: 95% 96%   Vitals:   10/09/24 1941 10/09/24 1958 10/10/24 0548 10/10/24 0745  BP: (!) 143/88  127/76   Pulse: 82  71   Resp: 18  17   Temp: 98.5 F (36.9 C)  98.7 F (37.1 C)   TempSrc:      SpO2: 97% 98% 95% 96%  Weight:      Height:        Physical Exam: GEN: NAD, alert and oriented x 3, obese HEENT: NCAT, PERRL, EOMI, sclera clear, MMM PULM: CTAB w/o wheezes/crackles, normal respiratory effort, room air CV: RRR w/o M/G/R GI: abd soft, NTND, + BS MSK: no peripheral edema, muscle strength globally intact 5/5 bilateral upper/lower extremities NEURO: CN II-XII intact, no focal deficits, sensation to light touch intact PSYCH: normal mood/affect Integumentary: dry/intact, no rashes or wounds    The results of significant diagnostics from this hospitalization (including imaging, microbiology, ancillary and laboratory) are listed below for reference.     Microbiology: Recent Results (from the past 240 hours)  Gastrointestinal Panel by PCR , Stool     Status: Abnormal   Collection Time: 10/08/24 12:46 PM   Specimen: Stool  Result Value Ref Range Status   Campylobacter species NOT DETECTED NOT DETECTED Final   Plesimonas shigelloides NOT DETECTED NOT DETECTED Final   Salmonella species NOT DETECTED NOT DETECTED Final   Yersinia enterocolitica NOT DETECTED NOT DETECTED Final   Vibrio species NOT DETECTED NOT DETECTED Final   Vibrio cholerae NOT DETECTED NOT DETECTED Final   Enteroaggregative E coli (EAEC) DETECTED (A) NOT DETECTED Final    Comment: RESULT CALLED TO, READ BACK BY AND VERIFIED  WITH: JENNIFER H., RN AT 1649 10/09/24 RAM    Enteropathogenic E coli (EPEC) NOT DETECTED NOT DETECTED Final   Enterotoxigenic E coli (ETEC) NOT DETECTED NOT DETECTED Final   Shiga like toxin producing E coli (STEC) NOT DETECTED NOT DETECTED Final   Shigella/Enteroinvasive E coli (EIEC) DETECTED (A) NOT DETECTED Final    Comment: RESULT CALLED TO, READ BACK BY AND VERIFIED WITH: JENNIFER H., RN AT 1649 10/09/24 RAM    Cryptosporidium NOT DETECTED NOT DETECTED Final   Cyclospora cayetanensis NOT DETECTED NOT DETECTED Final   Entamoeba histolytica NOT DETECTED NOT DETECTED Final   Giardia lamblia NOT DETECTED NOT DETECTED Final   Adenovirus F40/41 NOT DETECTED NOT DETECTED Final   Astrovirus NOT DETECTED NOT DETECTED Final   Norovirus GI/GII NOT DETECTED NOT DETECTED Final   Rotavirus A NOT DETECTED NOT DETECTED Final   Sapovirus (I, II, IV, and V) NOT DETECTED NOT DETECTED Final    Comment: Performed at [facility], [address] [place], [place] [address]  C Difficile Quick Screen w PCR reflex     Status: None   Collection Time: 10/08/24 12:46 PM   Specimen: STOOL  Result Value Ref Range Status   C Diff antigen NEGATIVE NEGATIVE Final   C Diff toxin NEGATIVE NEGATIVE Final   C Diff interpretation No C.  difficile detected.  Final    Comment: Performed at [facility], [address], [place], [place] [address]     Labs: BNP (last 3 results) No results for input(s): BNP in the last 8760 hours. Basic Metabolic Panel: Recent Labs  Lab 10/07/24 0812 10/08/24 0453 10/09/24 0525 10/10/24 0551  NA 135 131* 136 140  K 4.0 3.7 3.4* 3.5  CL 103 101 102 106  CO2 23 22 25 25   GLUCOSE 119* 96 93 98  BUN 9 11 6  <5*  CREATININE 1.19 1.23 1.29* 1.00  CALCIUM  9.3 7.9* 8.7* 8.7*  MG  --   --  2.3 2.1  PHOS  --   --  2.2* 3.0   Liver Function Tests: Recent Labs  Lab 10/07/24 0812  AST 20  ALT 16  ALKPHOS 79  BILITOT 0.8  PROT 7.0  ALBUMIN 4.4    Recent Labs  Lab 10/07/24 0812  LIPASE 31   No results for input(s): AMMONIA in the last 168 hours. CBC: Recent Labs  Lab 10/07/24 0812 10/08/24 0453 10/09/24 0525 10/10/24 0551  WBC 12.4* 13.3* 8.8 6.1  HGB 17.3* 15.8 14.9 14.3  HCT 51.3 47.9 46.1 44.3  MCV 82.3 83.3 84.7 84.9  PLT 166 152 151 143*   Cardiac Enzymes: No results for input(s): CKTOTAL, CKMB, CKMBINDEX, TROPONINI in the last 168 hours. BNP: Invalid input(s): POCBNP CBG: No results for input(s): GLUCAP in the last 168 hours. D-Dimer No results for input(s): DDIMER in the last 72 hours. Hgb A1c No results for input(s): HGBA1C in the last 72 hours. Lipid Profile No results for input(s): CHOL, HDL, LDLCALC, TRIG, CHOLHDL, LDLDIRECT in the last 72 hours. Thyroid  function studies No results for input(s): TSH, T4TOTAL, T3FREE, THYROIDAB in the last 72 hours.  Invalid input(s): FREET3 Anemia work up No results for input(s): VITAMINB12, FOLATE, FERRITIN, TIBC, IRON, RETICCTPCT in the last 72 hours. Urinalysis    Component Value Date/Time   COLORURINE YELLOW 10/07/2024 0853   APPEARANCEUR CLEAR 10/07/2024 0853   LABSPEC 1.019 10/07/2024 0853   PHURINE 5.0 10/07/2024 0853   GLUCOSEU NEGATIVE 10/07/2024 0853   HGBUR SMALL (A) 10/07/2024 0853   BILIRUBINUR NEGATIVE 10/07/2024 0853   BILIRUBINUR NEG 07/17/2023 0919   KETONESUR NEGATIVE 10/07/2024 0853   PROTEINUR NEGATIVE 10/07/2024 0853   UROBILINOGEN 1.0 07/17/2023 0919   NITRITE NEGATIVE 10/07/2024 0853   LEUKOCYTESUR NEGATIVE 10/07/2024 0853   Sepsis Labs Recent Labs  Lab 10/07/24 0812 10/08/24 0453 10/09/24 0525 10/10/24 0551  WBC 12.4* 13.3* 8.8 6.1   Microbiology Recent Results (from the past 240 hours)  Gastrointestinal Panel by PCR , Stool     Status: Abnormal   Collection Time: 10/08/24 12:46 PM   Specimen: Stool  Result Value Ref Range Status   Campylobacter species NOT DETECTED  NOT DETECTED Final   Plesimonas shigelloides NOT DETECTED NOT DETECTED Final   Salmonella species NOT DETECTED NOT DETECTED Final   Yersinia enterocolitica NOT DETECTED NOT DETECTED Final   Vibrio species NOT DETECTED NOT DETECTED Final   Vibrio cholerae NOT DETECTED NOT DETECTED Final   Enteroaggregative E coli (EAEC) DETECTED (A) NOT DETECTED Final    Comment: RESULT CALLED TO, READ BACK BY AND VERIFIED WITH: DELON DEL., RN AT 1649 10/09/24 RAM    Enteropathogenic E coli (EPEC) NOT DETECTED NOT DETECTED Final   Enterotoxigenic E coli (ETEC) NOT DETECTED NOT DETECTED Final   Shiga like toxin producing E coli (STEC) NOT DETECTED NOT DETECTED Final  Shigella/Enteroinvasive E coli (EIEC) DETECTED (A) NOT DETECTED Final    Comment: RESULT CALLED TO, READ BACK BY AND VERIFIED WITH: DELON DEL., RN AT 1649 10/09/24 RAM    Cryptosporidium NOT DETECTED NOT DETECTED Final   Cyclospora cayetanensis NOT DETECTED NOT DETECTED Final   Entamoeba histolytica NOT DETECTED NOT DETECTED Final   Giardia lamblia NOT DETECTED NOT DETECTED Final   Adenovirus F40/41 NOT DETECTED NOT DETECTED Final   Astrovirus NOT DETECTED NOT DETECTED Final   Norovirus GI/GII NOT DETECTED NOT DETECTED Final   Rotavirus A NOT DETECTED NOT DETECTED Final   Sapovirus (I, II, IV, and V) NOT DETECTED NOT DETECTED Final    Comment: Performed at [facility], [address] [place], [place] [address]  C Difficile Quick Screen w PCR reflex     Status: None   Collection Time: 10/08/24 12:46 PM   Specimen: STOOL  Result Value Ref Range Status   C Diff antigen NEGATIVE NEGATIVE Final   C Diff toxin NEGATIVE NEGATIVE Final   C Diff interpretation No C. difficile detected.  Final    Comment: Performed at [facility], [address], [place], [place] [address]     Time coordinating discharge: Over 30 minutes  SIGNED:   Camellia PARAS Youssouf Shipley, DO  [facility] 10/10/2024, 9:59 AM

## 2024-10-12 ENCOUNTER — Ambulatory Visit: Payer: Self-pay

## 2024-10-12 ENCOUNTER — Telehealth: Payer: Self-pay | Admitting: *Deleted

## 2024-10-12 DIAGNOSIS — J301 Allergic rhinitis due to pollen: Secondary | ICD-10-CM | POA: Diagnosis not present

## 2024-10-12 DIAGNOSIS — J3089 Other allergic rhinitis: Secondary | ICD-10-CM | POA: Diagnosis not present

## 2024-10-12 NOTE — Telephone Encounter (Signed)
 If he is having any worsening pain or worsening diarrhea should be seen by any provider or urgent care tomorrow.  I am happy to see him on Wednesday if stable/improving.

## 2024-10-12 NOTE — Telephone Encounter (Signed)
 Cannot be seen any sooner than current appt unless he changes his mind and is willing to see another provider, should patient continue Cipro  until that appt or should he discontinue the medication.

## 2024-10-12 NOTE — Transitions of Care (Post Inpatient/ED Visit) (Signed)
   10/12/2024  Name: Randall Reed. MRN: [id number] DOB: Mar 27, 1971  Today's TOC FU Call Status: Today's TOC FU Call Status:: Unsuccessful Call (1st Attempt) Unsuccessful Call (1st Attempt) Date: 10/12/24  Attempted to reach the patient regarding the most recent Inpatient visit.  Left HIPAA compliant voice message   Follow Up Plan: Additional outreach attempts will be made to reach the patient to complete the Transitions of Care (Post Inpatient/ED visit) call.   Pls call/ message for questions,  Meigan Pates Mckinney Trimaine Maser, RN, BSN, CCRN Alumnus RN Care Manager  Transitions of Care  [facility] [phone]: direct office

## 2024-10-12 NOTE — Telephone Encounter (Signed)
 FYI Only or Action Required?: Action required by provider: request for appointment and clinical question for provider. See if pt can be seen by pcp sooner than 12/10 for hospital f/u. Question about continuing cipro  given diarrhea.  Patient was last seen in primary care on 09/11/2024 by Lucius Krabbe, NP.  Called Nurse Triage reporting Abdominal Pain and Diarrhea.  Symptoms began several days ago.  Interventions attempted: OTC medications: Tylenol .  Symptoms are: gradually improving.  Triage Disposition: See Physician Within 24 Hours  Patient/caregiver understands and will follow disposition?: No, refuses disposition Wants to wait until 10/10 to see PCP for hospital f/u  Copied from CRM #[id number]. Topic: Clinical - Red Word Triage >> Oct 12, 2024  1:50 PM Viola F wrote: Red Word that prompted transfer to Nurse Triage: Patient having abdominal pain and diarrhea. He was seen 10/07/2024 - 10/10/2024 at [facility] and has hospital follow up scheduled for 10/14/24 Reason for Disposition  [1] MODERATE pain (e.g., interferes with normal activities) AND [2] pain comes and goes (cramps) AND [3] present > 24 hours  (Exception: Pain with Vomiting or Diarrhea - see that Guideline.)  MODERATE diarrhea (e.g., 4-6 times / day more than normal)    10-12 episodes yesterday, small amount each time. 3 episodes today, small amount.  Answer Assessment - Initial Assessment Questions Pt reports going to ED at [facility] on 12/3 with severe abdominal pain, diarrhea and vomiting. Admitted until and dx with colitis. Discharged home 12/6 with cipro  500 mg BID, still taking. Since discharge has had varying degrees of diarrhea and intermittent 0-6/10 abdominal pain, primarily in LLQ. Worse with movement. Reports having almost no pain when discharging from the hospital but that he was taking pain mediation up until discharge. Only taking tylenol  currently. 10-12 episodes liquid diarrhea yesterday. 3 episodes  today, small amount each time. Drinking plenty of liquids including pedialyte. Denies signs of dehydration. Has small amount of bright red blood in the stool and when wiping, has hx of hemorrhoids with similar bleeding. Hx diverticulitis with entire sigmoid colon removed 1 year ago. HR 76 currently, has gotten up to 136 per smart watch. Some nausea, taking zofran . Denies emesis. Denies fever. Already has f/u appt scheduled with PCP Wednesday, advised being seen in next 24 hours. Declines d/t no appt availability with PCP in next 24 hours. Advised not to delay care and to seek UC or ED for worsening symptoms. Pt verbalizes understanding, states he will call to schedule sooner if symptoms worsen but would rather see PCP. Pt placed on waitlist, forwarding to office to see if pt can be worked in sooner.  1. LOCATION: Where does it hurt?      Entire belly, mostly LLQ  2. RADIATION: Does the pain shoot anywhere else? (e.g., chest, back)     Mostly LLQ of belly  3. ONSET: When did the pain begin? (Minutes, hours or days ago)      Since going to the ED on Wednesday, has improved  4. SUDDEN: Gradual or sudden onset?     Gradual  5. PATTERN Does the pain come and go, or is it constant?     Comes and goes  6. SEVERITY: How bad is the pain?  (e.g., Scale 1-10; mild, moderate, or severe)     0-6/10  7. RECURRENT SYMPTOM: Have you ever had this type of stomach pain before? If Yes, ask: When was the last time? and What happened that time?      Yes, all  the time before diverticulitis surgery a year ago. Then went away for the most part.  8. CAUSE: What do you think is causing the stomach pain? (e.g., gallstones, recent abdominal surgery)     Dx with colitis at the hospital   9. RELIEVING/AGGRAVATING FACTORS: What makes it better or worse? (e.g., antacids, bending or twisting motion, bowel movement)     Rest  10. OTHER SYMPTOMS: Do you have any other symptoms? (e.g., back pain,  diarrhea, fever, urination pain, vomiting)       Small amount of bright red blood in stool and when wiping. Has hx of hemorrhoids with similar bleeding.  Answer Assessment - Initial Assessment Questions 1. ANTIBIOTIC: What antibiotic are you taking? How many times per day?     Cipro  500 mg bid  2. ANTIBIOTIC ONSET: When was the antibiotic started?     Friday night  3. DIARRHEA SEVERITY: How bad is the diarrhea? How many more stools have you had in the past 24 hours than normal?      Yesterday 10-12 episodes, today only 3 episodes. Small amount each time.  4. ONSET: When did the diarrhea begin?      Saturday night  5. BM CONSISTENCY: How loose or watery is the diarrhea?      Liquid  6. VOMITING: Are you also vomiting? If Yes, ask: How many times in the past 24 hours?      Denies  7. ABDOMEN PAIN: Are you having any abdomen pain? If Yes, ask: What does it feel like? (e.g., crampy, dull, intermittent, constant)      Yes, mostly in LLQ, ranges from dull to sharp  8. ABDOMEN PAIN SEVERITY: If present, ask: How bad is the pain?  (e.g., Scale 1-10; mild, moderate, or severe)     0-6/10, fluctuates   9. ORAL INTAKE: If vomiting, Have you been able to drink liquids? How much liquids have you had in the past 24 hours?     Drinking plenty. Over past 24 hours has drank 4 things of ginger ale, 24 oz tea, very large Pedialyte  10. HYDRATION: Any signs of dehydration? (e.g., dry mouth [not just dry lips], too weak to stand, dizziness, new weight loss) When did you last urinate?       Denies  11. EXPOSURE: Have you traveled to a foreign country recently? Have you been exposed to anyone with diarrhea? Could you have eaten any food that was spoiled?       Denies  12. OTHER SYMPTOMS: Do you have any other symptoms? (e.g., fever, blood in stool)       Lethargy. Small amount of bright red blood in stool when wiping and in stool, hx of hemorrhoids. Denies  fever.  Protocols used: Abdominal Pain - Male-A-AH, Diarrhea on Antibiotics-A-AH

## 2024-10-13 ENCOUNTER — Telehealth: Payer: Self-pay | Admitting: *Deleted

## 2024-10-13 NOTE — Transitions of Care (Post Inpatient/ED Visit) (Signed)
   10/13/2024  Name: Randall Reed. MRN: [id number] DOB: [birth year]  Today's TOC FU Call Status:    Attempted to reach the patient regarding the most recent Inpatient visit.  Left HIPAA compliant voice message   Follow Up Plan: Additional outreach attempts will be made to reach the patient to complete the Transitions of Care (Post Inpatient/ED visit) call.   Pls call/ message for questions,  Domanik Rainville Mckinney Marykate Heuberger, RN, BSN, CCRN Alumnus RN Care Manager  Transitions of Care  [facility] [phone]: direct office

## 2024-10-13 NOTE — Telephone Encounter (Signed)
 Called patient and he feels okay. Still having issues with loose stools. Patient is drinking plenty of fluids. He has appt tomorrow with Dr. Levora. He will wait until then. I did advise him if he starts to feel worse, go to ED or UC. He verbalized understanding.

## 2024-10-14 ENCOUNTER — Inpatient Hospital Stay (HOSPITAL_COMMUNITY)
Admission: EM | Admit: 2024-10-14 | Discharge: 2024-10-17 | DRG: 372 | Disposition: A | Source: Ambulatory Visit | Attending: Internal Medicine | Admitting: Internal Medicine

## 2024-10-14 ENCOUNTER — Encounter: Payer: Self-pay | Admitting: Family Medicine

## 2024-10-14 ENCOUNTER — Emergency Department (HOSPITAL_COMMUNITY)

## 2024-10-14 ENCOUNTER — Other Ambulatory Visit: Payer: Self-pay

## 2024-10-14 ENCOUNTER — Ambulatory Visit

## 2024-10-14 ENCOUNTER — Telehealth: Payer: Self-pay | Admitting: *Deleted

## 2024-10-14 ENCOUNTER — Ambulatory Visit: Admitting: Family Medicine

## 2024-10-14 ENCOUNTER — Encounter (HOSPITAL_COMMUNITY): Payer: Self-pay

## 2024-10-14 VITALS — BP 102/60 | HR 77 | Temp 98.4°F | Resp 15 | Ht 71.0 in | Wt 264.6 lb

## 2024-10-14 DIAGNOSIS — Z833 Family history of diabetes mellitus: Secondary | ICD-10-CM | POA: Diagnosis not present

## 2024-10-14 DIAGNOSIS — K529 Noninfective gastroenteritis and colitis, unspecified: Secondary | ICD-10-CM

## 2024-10-14 DIAGNOSIS — I10 Essential (primary) hypertension: Secondary | ICD-10-CM | POA: Diagnosis present

## 2024-10-14 DIAGNOSIS — R5383 Other fatigue: Secondary | ICD-10-CM

## 2024-10-14 DIAGNOSIS — A498 Other bacterial infections of unspecified site: Secondary | ICD-10-CM

## 2024-10-14 DIAGNOSIS — E230 Hypopituitarism: Secondary | ICD-10-CM | POA: Diagnosis present

## 2024-10-14 DIAGNOSIS — R3911 Hesitancy of micturition: Secondary | ICD-10-CM | POA: Diagnosis not present

## 2024-10-14 DIAGNOSIS — E876 Hypokalemia: Secondary | ICD-10-CM

## 2024-10-14 DIAGNOSIS — Z7951 Long term (current) use of inhaled steroids: Secondary | ICD-10-CM | POA: Diagnosis not present

## 2024-10-14 DIAGNOSIS — A039 Shigellosis, unspecified: Secondary | ICD-10-CM

## 2024-10-14 DIAGNOSIS — J45909 Unspecified asthma, uncomplicated: Secondary | ICD-10-CM | POA: Diagnosis present

## 2024-10-14 DIAGNOSIS — R1084 Generalized abdominal pain: Secondary | ICD-10-CM | POA: Diagnosis not present

## 2024-10-14 DIAGNOSIS — K429 Umbilical hernia without obstruction or gangrene: Secondary | ICD-10-CM | POA: Diagnosis not present

## 2024-10-14 DIAGNOSIS — N400 Enlarged prostate without lower urinary tract symptoms: Secondary | ICD-10-CM | POA: Diagnosis not present

## 2024-10-14 DIAGNOSIS — A044 Other intestinal Escherichia coli infections: Secondary | ICD-10-CM | POA: Diagnosis present

## 2024-10-14 DIAGNOSIS — Z803 Family history of malignant neoplasm of breast: Secondary | ICD-10-CM | POA: Diagnosis not present

## 2024-10-14 DIAGNOSIS — I2510 Atherosclerotic heart disease of native coronary artery without angina pectoris: Secondary | ICD-10-CM | POA: Diagnosis present

## 2024-10-14 DIAGNOSIS — K219 Gastro-esophageal reflux disease without esophagitis: Secondary | ICD-10-CM | POA: Diagnosis present

## 2024-10-14 DIAGNOSIS — Z7982 Long term (current) use of aspirin: Secondary | ICD-10-CM | POA: Diagnosis not present

## 2024-10-14 DIAGNOSIS — E669 Obesity, unspecified: Secondary | ICD-10-CM | POA: Diagnosis present

## 2024-10-14 DIAGNOSIS — E785 Hyperlipidemia, unspecified: Secondary | ICD-10-CM | POA: Diagnosis present

## 2024-10-14 DIAGNOSIS — Z6835 Body mass index (BMI) 35.0-35.9, adult: Secondary | ICD-10-CM | POA: Diagnosis not present

## 2024-10-14 DIAGNOSIS — I110 Hypertensive heart disease with heart failure: Secondary | ICD-10-CM | POA: Diagnosis present

## 2024-10-14 DIAGNOSIS — Z821 Family history of blindness and visual loss: Secondary | ICD-10-CM | POA: Diagnosis not present

## 2024-10-14 DIAGNOSIS — K649 Unspecified hemorrhoids: Secondary | ICD-10-CM | POA: Diagnosis present

## 2024-10-14 DIAGNOSIS — F411 Generalized anxiety disorder: Secondary | ICD-10-CM | POA: Diagnosis present

## 2024-10-14 DIAGNOSIS — Z8249 Family history of ischemic heart disease and other diseases of the circulatory system: Secondary | ICD-10-CM | POA: Diagnosis not present

## 2024-10-14 DIAGNOSIS — Z9049 Acquired absence of other specified parts of digestive tract: Secondary | ICD-10-CM | POA: Diagnosis not present

## 2024-10-14 LAB — LIPASE, BLOOD: Lipase: 132 U/L — ABNORMAL HIGH (ref 11–51)

## 2024-10-14 LAB — URINALYSIS, ROUTINE W REFLEX MICROSCOPIC
Bilirubin Urine: NEGATIVE
Glucose, UA: NEGATIVE mg/dL
Hgb urine dipstick: NEGATIVE
Ketones, ur: NEGATIVE mg/dL
Leukocytes,Ua: NEGATIVE
Nitrite: NEGATIVE
Protein, ur: NEGATIVE mg/dL
Specific Gravity, Urine: 1.004 — ABNORMAL LOW (ref 1.005–1.030)
pH: 7 (ref 5.0–8.0)

## 2024-10-14 LAB — CBC
HCT: 53.3 % — ABNORMAL HIGH (ref 39.0–52.0)
Hemoglobin: 17.4 g/dL — ABNORMAL HIGH (ref 13.0–17.0)
MCH: 27.3 pg (ref 26.0–34.0)
MCHC: 32.6 g/dL (ref 30.0–36.0)
MCV: 83.7 fL (ref 80.0–100.0)
Platelets: 262 K/uL (ref 150–400)
RBC: 6.37 MIL/uL — ABNORMAL HIGH (ref 4.22–5.81)
RDW: 17.5 % — ABNORMAL HIGH (ref 11.5–15.5)
WBC: 6.8 K/uL (ref 4.0–10.5)
nRBC: 0 % (ref 0.0–0.2)

## 2024-10-14 LAB — COMPREHENSIVE METABOLIC PANEL WITH GFR
ALT: 26 U/L (ref 0–44)
AST: 24 U/L (ref 15–41)
Albumin: 4.3 g/dL (ref 3.5–5.0)
Alkaline Phosphatase: 84 U/L (ref 38–126)
Anion gap: 10 (ref 5–15)
BUN: 6 mg/dL (ref 6–20)
CO2: 26 mmol/L (ref 22–32)
Calcium: 9.6 mg/dL (ref 8.9–10.3)
Chloride: 104 mmol/L (ref 98–111)
Creatinine, Ser: 1.24 mg/dL (ref 0.61–1.24)
GFR, Estimated: >60 mL/min (ref >60)
Glucose, Bld: 82 mg/dL (ref 70–99)
Potassium: 4 mmol/L (ref 3.5–5.1)
Sodium: 140 mmol/L (ref 135–145)
Total Bilirubin: 0.6 mg/dL (ref 0.0–1.2)
Total Protein: 7.4 g/dL (ref 6.5–8.1)

## 2024-10-14 MED ORDER — BUDESONIDE 0.5 MG/2ML IN SUSP: 0.5000 mg | Freq: Two times a day (BID) | RESPIRATORY_TRACT | Status: DC | PRN

## 2024-10-14 MED ORDER — VONOPRAZAN FUMARATE 10 MG PO TABS: 1.0000 | ORAL_TABLET | Freq: Every day | ORAL | Status: DC

## 2024-10-14 MED ORDER — PANTOPRAZOLE SODIUM 40 MG PO TBEC
40.0000 mg | DELAYED_RELEASE_TABLET | Freq: Every day | ORAL | Status: DC | PRN
  Administered 2024-10-15 – 2024-10-16 (×2): 40 mg via ORAL
  Filled 2024-10-14 (×2): qty 1

## 2024-10-14 MED ORDER — EMTRICITABINE-TENOFOVIR AF 200-25 MG PO TABS
1.0000 | ORAL_TABLET | Freq: Every day | ORAL | Status: DC
  Administered 2024-10-15 – 2024-10-17 (×3): 1 via ORAL
  Filled 2024-10-14 (×3): qty 1

## 2024-10-14 MED ORDER — VITAMIN B-12 1000 MCG PO TABS
1000.0000 ug | ORAL_TABLET | Freq: Every day | ORAL | Status: DC
  Administered 2024-10-15 – 2024-10-17 (×3): 1000 ug via ORAL
  Filled 2024-10-14 (×3): qty 1

## 2024-10-14 MED ORDER — SODIUM CHLORIDE 0.9 % IV SOLN
2.0000 g | INTRAVENOUS | Status: DC
  Administered 2024-10-15 – 2024-10-16 (×2): 2 g via INTRAVENOUS
  Filled 2024-10-14 (×2): qty 20

## 2024-10-14 MED ORDER — METRONIDAZOLE 500 MG/100ML IV SOLN
500.0000 mg | Freq: Two times a day (BID) | INTRAVENOUS | Status: DC
  Administered 2024-10-15 – 2024-10-16 (×4): 500 mg via INTRAVENOUS
  Filled 2024-10-14 (×4): qty 100

## 2024-10-14 MED ORDER — MONTELUKAST SODIUM 10 MG PO TABS
10.0000 mg | ORAL_TABLET | Freq: Every day | ORAL | Status: DC
  Administered 2024-10-15 – 2024-10-16 (×3): 10 mg via ORAL
  Filled 2024-10-14 (×3): qty 1

## 2024-10-14 MED ORDER — ENOXAPARIN SODIUM 40 MG/0.4ML IJ SOSY
40.0000 mg | PREFILLED_SYRINGE | INTRAMUSCULAR | Status: DC
  Administered 2024-10-15 – 2024-10-17 (×3): 40 mg via SUBCUTANEOUS
  Filled 2024-10-14 (×3): qty 0.4

## 2024-10-14 MED ORDER — OLOPATADINE HCL 0.1 % OP SOLN
1.0000 [drp] | Freq: Two times a day (BID) | OPHTHALMIC | Status: DC
  Administered 2024-10-15 – 2024-10-17 (×6): 1 [drp] via OPHTHALMIC
  Filled 2024-10-14: qty 5

## 2024-10-14 MED ORDER — MIRABEGRON ER 25 MG PO TB24: 25.0000 mg | ORAL_TABLET | Freq: Every day | ORAL | Status: DC | PRN

## 2024-10-14 MED ORDER — ANASTROZOLE 1 MG PO TABS: 0.5000 mg | ORAL_TABLET | ORAL | Status: DC

## 2024-10-14 MED ORDER — MORPHINE SULFATE (PF) 2 MG/ML IV SOLN
2.0000 mg | INTRAVENOUS | Status: DC | PRN
  Administered 2024-10-15 (×2): 2 mg via INTRAVENOUS
  Filled 2024-10-14 (×2): qty 1

## 2024-10-14 MED ORDER — DICYCLOMINE HCL 20 MG PO TABS
20.0000 mg | ORAL_TABLET | Freq: Two times a day (BID) | ORAL | Status: DC | PRN
  Administered 2024-10-15: 20 mg via ORAL
  Filled 2024-10-14: qty 1

## 2024-10-14 MED ORDER — IOHEXOL 300 MG/ML  SOLN
100.0000 mL | Freq: Once | INTRAMUSCULAR | Status: AC | PRN
  Administered 2024-10-14: 100 mL via INTRAVENOUS

## 2024-10-14 MED ORDER — IPRATROPIUM BROMIDE 0.06 % NA SOLN
2.0000 | Freq: Two times a day (BID) | NASAL | Status: DC
  Administered 2024-10-15 – 2024-10-17 (×5): 2 via NASAL
  Filled 2024-10-14: qty 15

## 2024-10-14 MED ORDER — TAMSULOSIN HCL 0.4 MG PO CAPS: 0.4000 mg | ORAL_CAPSULE | Freq: Every day | ORAL | Status: DC

## 2024-10-14 MED ORDER — ALBUTEROL-BUDESONIDE 90-80 MCG/ACT IN AERO: 2.0000 | INHALATION_SPRAY | Freq: Two times a day (BID) | RESPIRATORY_TRACT | Status: DC

## 2024-10-14 MED ORDER — GUAIFENESIN ER 600 MG PO TB12
600.0000 mg | ORAL_TABLET | Freq: Two times a day (BID) | ORAL | Status: DC
  Administered 2024-10-15 – 2024-10-17 (×6): 600 mg via ORAL
  Filled 2024-10-14 (×6): qty 1

## 2024-10-14 MED ORDER — IRBESARTAN 75 MG PO TABS
75.0000 mg | ORAL_TABLET | Freq: Every day | ORAL | Status: DC
  Administered 2024-10-15 – 2024-10-17 (×3): 75 mg via ORAL
  Filled 2024-10-14 (×3): qty 1

## 2024-10-14 MED ORDER — FAMOTIDINE 20 MG PO TABS: 40.0000 mg | ORAL_TABLET | Freq: Every day | ORAL | Status: DC | PRN

## 2024-10-14 MED ORDER — ALPRAZOLAM 0.5 MG PO TABS
1.0000 mg | ORAL_TABLET | Freq: Two times a day (BID) | ORAL | Status: DC | PRN
  Administered 2024-10-15 – 2024-10-16 (×4): 1 mg via ORAL
  Filled 2024-10-14 (×4): qty 2

## 2024-10-14 MED ORDER — LACTATED RINGERS IV BOLUS
1000.0000 mL | Freq: Once | INTRAVENOUS | Status: AC
  Administered 2024-10-14: 1000 mL via INTRAVENOUS

## 2024-10-14 MED ORDER — FERROUS SULFATE 325 (65 FE) MG PO TABS
325.0000 mg | ORAL_TABLET | Freq: Two times a day (BID) | ORAL | Status: DC
  Filled 2024-10-14 (×2): qty 1

## 2024-10-14 MED ORDER — LORATADINE 10 MG PO TABS
10.0000 mg | ORAL_TABLET | Freq: Every day | ORAL | Status: DC
  Filled 2024-10-14: qty 1

## 2024-10-14 MED ORDER — DEXTROSE IN LACTATED RINGERS 5 % IV SOLN: INTRAVENOUS | Status: AC

## 2024-10-14 MED ORDER — TADALAFIL 5 MG PO TABS
5.0000 mg | ORAL_TABLET | Freq: Every day | ORAL | Status: DC
  Administered 2024-10-15 – 2024-10-16 (×2): 5 mg via ORAL
  Filled 2024-10-14 (×3): qty 1

## 2024-10-14 MED ORDER — SODIUM CHLORIDE 0.9 % IV BOLUS
1000.0000 mL | Freq: Once | INTRAVENOUS | Status: AC
  Administered 2024-10-14: 1000 mL via INTRAVENOUS

## 2024-10-14 MED ORDER — ASPIRIN 81 MG PO TBEC
81.0000 mg | DELAYED_RELEASE_TABLET | Freq: Every day | ORAL | Status: DC
  Administered 2024-10-15 – 2024-10-17 (×3): 81 mg via ORAL
  Filled 2024-10-14 (×3): qty 1

## 2024-10-14 MED ORDER — FENTANYL CITRATE (PF) 50 MCG/ML IJ SOSY
50.0000 ug | PREFILLED_SYRINGE | Freq: Once | INTRAMUSCULAR | Status: AC
  Administered 2024-10-14: 50 ug via INTRAVENOUS
  Filled 2024-10-14: qty 1

## 2024-10-14 MED ORDER — ROSUVASTATIN CALCIUM 10 MG PO TABS
10.0000 mg | ORAL_TABLET | Freq: Every day | ORAL | Status: DC
  Administered 2024-10-15 – 2024-10-17 (×3): 10 mg via ORAL
  Filled 2024-10-14 (×3): qty 1

## 2024-10-14 MED ORDER — ONDANSETRON 4 MG PO TBDP
4.0000 mg | ORAL_TABLET | Freq: Three times a day (TID) | ORAL | Status: DC | PRN
  Administered 2024-10-15 – 2024-10-16 (×3): 4 mg via ORAL
  Filled 2024-10-14 (×4): qty 1

## 2024-10-14 MED ORDER — METOPROLOL SUCCINATE ER 50 MG PO TB24
50.0000 mg | ORAL_TABLET | Freq: Every day | ORAL | Status: DC
  Administered 2024-10-15 – 2024-10-16 (×3): 50 mg via ORAL
  Filled 2024-10-14 (×3): qty 1

## 2024-10-14 NOTE — H&P (Signed)
 History and Physical    Patient: Randall Reed. FMW:[id number] DOB: [birth year] DOA: 10/14/2024 DOS: the patient was seen and examined on 10/14/2024 PCP: Levora Reyes SAUNDERS, MD  Patient coming from: Home  Chief Complaint:  Chief Complaint  Patient presents with   Diarrhea   HPI: Randall Alcocer. is a 52 y.o. male with medical history significant of for the history of anxiety disorder, essential hypertension, hyperlipidemia, previous colectomy due to diverticulitis, who was recently admitted with abdominal pain nausea vomiting secondary to Shigella and E. coli infection by PCR of the stool.  Patient was treated with IV antibiotics and discharged on oral ciprofloxacin .  Initially patient felt better but went home then couple of days later symptoms returned in full force.  Patient has been having bloody stool, hemorrhoids also flared up.  His pain came back.  Had some mild fever.  Patient returned to the emergency room after a visit to his PCP.  He was having mucousy stool and also decreased oral intake.  After visiting his PCP he was sent to the ER for further evaluation and treatment.  Repeat evaluation in the ER showed proctocolitis.  Patient has been admitted to the hospital for further treatment.  Review of Systems: As mentioned in the history of present illness. All other systems reviewed and are negative. Past Medical History:  Diagnosis Date   Allergy    Anemia    Anxiety    Arthritis    Asthma    CAD (coronary artery disease) 03/04/2024   CHF (congestive heart failure) (HCC) 03/04/2024   EF 50%   Complication of anesthesia    oxygen levelds dropped  with colonoscopy   Depression    Diverticulitis    Dyspnea    sometimes with asthma has sob   GERD (gastroesophageal reflux disease)    Headache    Hyperlipidemia    Phreesia 12/05/2020   Hypertension    Pneumonia    Seasonal allergic reaction    Sleep apnea    Past Surgical History:  Procedure Laterality Date    CIRCUMCISION  10/2022   COLON SURGERY  10/11/2023   COLONOSCOPY  07/17/2022   FLEXIBLE SIGMOIDOSCOPY N/A 10/11/2023   Procedure: FLEXIBLE SIGMOIDOSCOPY;  Surgeon: Teresa Lonni HERO, MD;  Location: [facility];  Service: General;  Laterality: N/A;   LIPOMA EXCISION N/A 10/30/2018   Procedure: EXCISION OF MULTIPLE SUBCUTANEOUS LIPOMAS ON TORSO;  Surgeon: Belinda Cough, MD;  Location: [facility];  Service: General;  Laterality: N/A;   UPPER GASTROINTESTINAL ENDOSCOPY  07/17/2022   XI ROBOTIC ASSISTED LOWER ANTERIOR RESECTION N/A 10/11/2023   Procedure: XI ROBOTIC ASSISTED LOWER ANTERIOR RESECTION, SIGMOIDECTOMY WITH INTRAOPERATIVE ASSESSMENT OF PERFUSION ICG, BILATERAL TAP BLOCK;  Surgeon: Teresa Lonni HERO, MD;  Location: [facility];  Service: General;  Laterality: N/A;  180   Social History:  reports that he has never smoked. He has never used smokeless tobacco. He reports current alcohol use of about 1.0 standard drink of alcohol per week. He reports that he does not use drugs.  Allergies  Allergen Reactions   Desloratadine Other (See Comments)    CLARINEX-severe headache CLARITIN - severe headache   Loratadine  Other (See Comments)    severe headache   Ketek [Telithromycin] Itching and Rash   Levbid  [Hyoscyamine  Sulfate] Rash    Family History  Problem Relation Age of Onset   Coronary artery disease Father 97       stents, CABG   Heart attack Father  Diabetes Father    Early death Father    Heart disease Father    Hypertension Father    Obesity Father    Varicose Veins Father    Heart attack Maternal Grandfather    Cancer Paternal Grandmother    Diabetes Paternal Grandmother    Hypertension Paternal Grandmother    Vision loss Paternal Grandmother    Varicose Veins Paternal Grandmother    Hearing loss Paternal Grandmother    Coronary artery disease Paternal Grandfather    Cancer Paternal Grandfather    Diabetes Paternal Grandfather    Heart disease  Paternal Grandfather    Hypertension Paternal Grandfather    Obesity Paternal Grandfather    Hypertension Other    Heart attack Other    Cancer Other    Breast cancer Other    Hyperlipidemia Other    Diabetes Paternal Aunt    Early death Paternal Aunt    Heart disease Paternal Aunt    Hypertension Paternal Aunt    Obesity Paternal Aunt    Stomach cancer Neg Hx    Esophageal cancer Neg Hx    Colon cancer Neg Hx    Colon polyps Neg Hx    Rectal cancer Neg Hx     Prior to Admission medications   Medication Sig Start Date End Date Taking? Authorizing Provider  AIRSUPRA  90-80 MCG/ACT AERO Inhale 2 puffs into the lungs every 4 (four) hours as needed. Patient taking differently: Inhale 2 puffs into the lungs 2 (two) times daily. 03/30/23   [provider]  albuterol  (VENTOLIN  HFA) 108 (90 Base) MCG/ACT inhaler INHALE 2 PUFFS INTO THE LUNGS EVERY 6 HOURS AS NEEDED FOR WHEEZING OR SHORTNESS OF BREATH 07/19/20   Levora Reyes SAUNDERS, MD  ALLEGRA  ALLERGY 180 MG tablet Take 180 mg by mouth daily. 07/12/20   [provider]  ALPRAZolam  (XANAX ) 1 MG tablet TAKE 1 TABLET BY MOUTH TWICE DAILY AS NEEDED Patient taking differently: Take 1 mg by mouth 2 (two) times daily as needed for anxiety or sleep. 09/25/24   Levora Reyes SAUNDERS, MD  anastrozole (ARIMIDEX) 1 MG tablet Take 0.5 mg by mouth once a week.    [provider]  aspirin  EC 81 MG tablet Take 1 tablet (81 mg total) by mouth daily. 03/07/15   Wonda Sharper, MD  Budeson-Glycopyrrol-Formoterol  (BREZTRI  AEROSPHERE) 160-9-4.8 MCG/ACT AERO Inhale 2 puffs into the lungs in the morning and at bedtime. 09/14/21   [provider]  budesonide  (PULMICORT ) 0.5 MG/2ML nebulizer solution Take 0.5 mg by nebulization 2 (two) times daily as needed (wheezing / sob). 06/29/24   [provider]  ciprofloxacin  (CIPRO ) 500 MG tablet Take 1 tablet (500 mg total) by mouth 2 (two) times daily for 10 days. 10/10/24 10/20/24  [place],  Eric J, DO  cyanocobalamin  (VITAMIN B12) 1000 MCG tablet Take 1 tablet (1,000 mcg total) by mouth daily. 05/04/24   Levora Reyes SAUNDERS, MD  dicyclomine  (BENTYL ) 20 MG tablet Take 1 tablet (20 mg total) by mouth in the morning and at bedtime. Patient taking differently: Take 20 mg by mouth 2 (two) times daily as needed for spasms. 05/25/24   [place], PA-C  doxycycline  (VIBRA -TABS) 100 MG tablet Take 2 tablets (200 mg total) by mouth daily as needed. Patient taking differently: Take 200 mg by mouth daily as needed (infection/exposure). 09/22/24   Levora Reyes SAUNDERS, MD  DUPIXENT 300 MG/2ML prefilled syringe Inject 300 mg into the skin every 14 (fourteen) days. 10/06/21   [provider]  emtricitabine -tenofovir  (TRUVADA ) 200-300 MG tablet TAKE 1 TABLET BY MOUTH DAILY 04/30/24   Levora Reyes SAUNDERS, MD  famotidine (PEPCID) 40 MG tablet Take 40 mg by mouth daily as needed for heartburn or indigestion. 02/18/20   [provider]  FEROSUL 325 (65 Fe) MG tablet TAKE 1 TABLET(325 MG) BY MOUTH DAILY WITH BREAKFAST 09/22/24   Levora Reyes SAUNDERS, MD  furosemide  (LASIX ) 20 MG tablet Take 1 tablet (20 mg total) by mouth daily. As needed for edema Patient taking differently: Take 20 mg by mouth daily as needed for fluid or edema. 09/03/24   Wonda Sharper, MD  guaiFENesin  (MUCINEX ) 600 MG 12 hr tablet Take 1 tablet (600 mg total) by mouth 2 (two) times daily for 14 days. 10/10/24 10/24/24  [place], Eric J, DO  ipratropium (ATROVENT ) 0.06 % nasal spray Place 2 sprays into the nose 2 (two) times daily. 05/25/21   Levora Reyes SAUNDERS, MD  metoprolol  succinate (TOPROL -XL) 50 MG 24 hr tablet Take 1 tablet (50 mg total) by mouth daily. Take with or immediately following a meal. Patient taking differently: Take 50 mg by mouth at bedtime. Take with or immediately following a meal. 09/03/24   Wonda Sharper, MD  mirabegron ER (MYRBETRIQ) 25 MG TB24 tablet Take 25 mg by mouth daily as needed (bladder spasm).     [provider]  montelukast  (SINGULAIR ) 10 MG tablet TAKE 1 TABLET(10 MG) BY MOUTH AT BEDTIME 03/23/24   Levora Reyes SAUNDERS, MD  olopatadine  (PATADAY ) 0.1 % ophthalmic solution Place 1 drop into both eyes 2 (two) times daily as needed for allergies. 01/17/24   Jerrell Cleatus Ned, MD  Olopatadine -Mometasone  ([name] ) [phone] MCG/ACT SUSP Place 1 spray into the nose in the morning and at bedtime.    [provider]  ondansetron  (ZOFRAN -ODT) 4 MG disintegrating tablet Take 1 tablet (4 mg total) by mouth every 8 (eight) hours as needed for nausea or vomiting. 10/10/24   [place], Camellia PARAS, DO  pantoprazole  (PROTONIX ) 40 MG tablet Take 1 tablet (40 mg total) by mouth daily. Patient taking differently: Take 40 mg by mouth daily as needed (acid reflux). 05/01/24   Levora Reyes SAUNDERS, MD  Potassium Chloride  ER 20 MEQ TBCR TAKE 1 TABLET(20 MEQ) BY MOUTH DAILY 07/01/24   Wonda Sharper, MD  rizatriptan  (MAXALT -MLT) 10 MG disintegrating tablet DISSOLVE 1 TABLET IN MOUTH AS NEEDED FOR HEADACHE. REPEAT  IN  2  HOURS  AS  NEEDED. 09/25/24   Levora Reyes SAUNDERS, MD  rosuvastatin  (CRESTOR ) 10 MG tablet Take 1 tablet (10 mg total) by mouth daily. 09/03/24   Wonda Sharper, MD  sildenafil  (VIAGRA ) 100 MG tablet Take 1/2-1 tablet by mouth once daily as needed prior to sexual activity *MUST KEEP UPCOMING APPT* 04/02/23   Wonda Sharper, MD  tadalafil  (CIALIS ) 5 MG tablet Take 5 mg by mouth daily at 12 noon. 02/01/23   [provider]  tamsulosin  (FLOMAX ) 0.4 MG CAPS capsule TAKE 1 CAPSULE(0.4 MG) BY MOUTH DAILY AS NEEDED Patient taking differently: Take 0.4 mg by mouth daily after supper. 09/17/24   Levora Reyes SAUNDERS, MD  testosterone  cypionate (DEPOTESTOSTERONE CYPIONATE) 200 MG/ML injection Inject 1 mL into the muscle once a week. 07/07/22   [provider]  valsartan  (DIOVAN ) 80 MG tablet Take 1 tablet (80 mg total) by mouth daily. 09/03/24   Wonda Sharper, MD  Vitamin D , Ergocalciferol ,  (DRISDOL ) 1.25 MG (50000 UNIT) CAPS capsule TAKE ONE CAPSULE BY MOUTH EVERY 7 DAYS Patient taking differently:  TAKE ONE CAPSULE BY MOUTH EVERY 7 DAYS Friday 08/13/24   Levora Reyes SAUNDERS, MD  Vonoprazan Fumarate  (VOQUEZNA ) 10 MG TABS Take 1 tablet by mouth daily. 07/14/24   Leigh Elspeth SQUIBB, MD  zolpidem  (AMBIEN ) 10 MG tablet TAKE 1 TABLET(10 MG) BY MOUTH AT BEDTIME 07/21/24   Levora Reyes SAUNDERS, MD  dicyclomine  (BENTYL ) 20 MG tablet Take 20 mg by mouth 3 (three) times daily before meals.    [provider]    Physical Exam: Vitals:   10/14/24 1434 10/14/24 1837 10/14/24 1838 10/14/24 2246  BP:  133/77  (!) 157/78  Pulse:  63  (!) 57  Resp:  14  18  Temp:   98.9 F (37.2 C) 98.6 F (37 C)  TempSrc:   Oral   SpO2:  94%  98%  Weight: 115.2 kg     Height: 5' 11 (1.803 m)      Constitutional: Acutely ill looking, NAD, calm, comfortable Eyes: PERRL, lids and conjunctivae normal ENMT: Mucous membranes are dry.. Posterior pharynx clear of any exudate or lesions.Normal dentition.  Neck: normal, supple, no masses, no thyromegaly Respiratory: clear to auscultation bilaterally, no wheezing, no crackles. Normal respiratory effort. No accessory muscle use.  Cardiovascular: Regular rate and rhythm, no murmurs / rubs / gallops. No extremity edema. 2+ pedal pulses. No carotid bruits.  Abdomen: Diffuse abdominal tenderness, no masses palpated. No hepatosplenomegaly. Bowel sounds positive.  Musculoskeletal: Good range of motion, no joint swelling or tenderness, Skin: no rashes, lesions, ulcers. No induration Neurologic: CN 2-12 grossly intact. Sensation intact, DTR normal. Strength 5/5 in all 4.  Psychiatric: Normal judgment and insight. Alert and oriented x 3.  Anxious mood  Data Reviewed:  Temperature 98.9, blood pressure 157/78, pulse 97, white count 6.8, hemoglobin 17.4 and platelets 262.  Lipase 132, chemistry within normal urinalysis essentially negative CT abdomen and pelvis showed  mild increase stranding along the distal descending and rectosigmoid colon concerning for proctocolitis.  There is prominent appendix measuring 10 mm similar to previous 1.  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  Previously secondary to show Shigella and E. coli.  May be the same agents but could also be different.  Patient has been on antibiotics so could have C. difficile this time around.  Will check C. difficile and repeat stool studies.  In the meantime initiate Rocephin  and Flagyl .  #2 essential hypertension: Continue blood pressure meds.  #3 GERD: Continues PPIs.  #4 coronary artery disease: Stable.  Continue monitor  #5 generalized anxiety disorder: Continue with home regimen  #6 hypopituitarism: Will continue home regimen at discharge.    Advance Care Planning:   Code Status: Prior full code  Consults: None  Family Communication: No family at bedside  Severity of Illness: The appropriate patient status for this patient is INPATIENT. Inpatient status is judged to be reasonable and necessary in order to provide the required intensity of service to ensure the patient's safety. The patient's presenting symptoms, physical exam findings, and initial radiographic and laboratory data in the context of their chronic comorbidities is felt to place them at high risk for further clinical deterioration. Furthermore, it is not anticipated that the patient will be medically stable for discharge from the hospital within 2 midnights of admission.   * I certify that at the point of admission it is my clinical judgment that the patient will require inpatient hospital care spanning beyond  2 midnights from the point of admission due to high intensity of service, high risk for further deterioration and high frequency of surveillance required.*  AuthorBETHA SIM KNOLL, MD 10/14/2024 11:40 PM  For on  call review [email or web address].

## 2024-10-14 NOTE — ED Triage Notes (Signed)
 Patient was recently admitted with colitis. Still has diarrhea and 2 different kinds of ecoli. Has trouble urinating. Has a headache and lower abdominal pain.

## 2024-10-14 NOTE — Transitions of Care (Post Inpatient/ED Visit) (Signed)
° °  10/14/2024  Name: Randall Reed. MRN: [id number] DOB: Jan 09, 1971  Today's TOC FU Call Status: Today's TOC FU Call Status:: Successful TOC FU Call Completed TOC FU Call Complete Date: 10/14/24  Patient's Name and Date of Birth confirmed. Name, DOB  Transition Care Management Follow-up Telephone Call Date of Discharge: 10/10/24 Discharge Facility: [facility]) Type of Discharge: Inpatient Admission Primary Inpatient Discharge Diagnosis:: Colitis How have you been since you were released from the hospital?: Worse Any questions or concerns?: Yes Patient Questions/Concerns:: Patirent went for follow up and sent to ED  Per patient he went to PCP and was sent to ER. He is currently in ER  Cathlean Headland BSN RN [facility] Management Coordinator [email or web address] Direct Dial: [phone]  Fax: [phone] Website: [email or web address]

## 2024-10-14 NOTE — Progress Notes (Signed)
 Subjective:  Patient ID: Randall JONETTA Floria Mickey., male    DOB: Nov 16, 1970  Age: 53 y.o. MRN: [id number]  CC:  Chief Complaint  Patient presents with   Transitions Of Care    10/07/2024 - 10/10/2024 (3 days) [facility]     Diarrhea    Patient is still having diarrhea. Moderate intake of liquids. Patient is not feeling well. He is not able to control his diarrhea and not able to make it to the bathroom. Slight blood in stool. Patient has felt feverish. Able to eat some. Having a hard time urinating. Urologist PT taught him some tricks and that is not really helping. Slight HA.     HPI Randall Zajicek. presents for   Transition of care visit, hospital follow-up Transition of care phone call attempted x 2, unsuccessful.  Colitis Admitted December 3 through December 6.  Discharge summary reviewed.  Initially presented with lower abdominal pain with chills nausea vomiting and diarrhea on December 3.  Ongoing symptoms for prior 3 days.  No known sick contacts or recent antibiotic use.  Temp was 100.9.  White blood cell count 12.4,Urinalysis unrevealing.  Lactic acid 1.6.  CT abdomen pelvis with slight inflammation of the transverse and ascending colon consistent with enteritis, colitis and small stable umbilical hernia, small right inguinal hernia, intact rectosigmoid surgical anastomosis.  He was treated with lactated ringer  bolus, IV morphine  and Zofran  in the ED.  Initially started on IV Zosyn .  C. difficile PCR negative.  Gastrointestinal PCR panel positive for Shigella and E. coli (EAEC/EIEC infection.  His white blood cell count improved, afebrile for 48 hours, transition to Cipro  500 mg twice daily to complete a 10-day course. Lab Results  Component Value Date   WBC 6.1 10/10/2024   HGB 14.3 10/10/2024   HCT 44.3 10/10/2024   MCV 84.9 10/10/2024   PLT 143 (L) 10/10/2024  He is followed by gastroenterology, on  Voquezna  for GERD, iron supplementation as above.  Phone call noted  from 2 days ago.  Hydrating effectively at that time. Abdominal pain 0-6 out of 10. No vomiting.  Still had persistent diarrhea up to 10-12 episodes but only 3 episodes that day.  He chose to follow-up with me as planned today, ED/urgent care recommendations declined in favor of follow up here today.   Today, states he is still having some diarrhea - 20 times per day. Some difficulty making it to the bathroom in time. Uncontrollable. Occasional blood in his bowel movements, with hx of hemorrhoids.  Bright red blood.  Still drinking fluids, urinating 3-4 times per day. Has been able to eat some foods - crackers, ginger ale yesterday - only ginger ale today. Had soup 2 days ago. Slight HA, no syncope/near-syncope sx's. Not lightheaded. Nausea but no vomiting. Feels fatigued.  No missed doses of Cipro . Not taking any antidiarrheal agents. Possible slight improvement 2 days ago - 1 normal BM, then back to frequent diarrhea. Small amounts, liquid BM.  Pain, symptoms appeared to be improving somewhat 3 days ago, then worsening since that time.  Difficulty with urination. He has seen urology previously, Dr. Vidal.  With voiding complaints, lower urinary tract symptoms, urgency, treated with pelvic floor physical therapy, Myrbetriq, Flomax . Feels like needs to urinate, but unable to go. Has tried a technique of leaning on wall to take pressure off. More difficulty with getting urine out - having to try 2-3 times to empty bladder, incomplete emptying. Still on flomax , myrbetriq. He  reports bedside ultrasound with 250 left. No change in treatment. No hematuria  On toprol  50mg  and valsartan  80mg  for HTN.   Feels warm today - unable to measure temp at home.   GI- Dr. Leigh.   Hypokalemia, hypophosphatemia Thought to be due to GI loss from diarrhea.  Repleted.  Phosphate also repleted. Lab Results  Component Value Date   NA 140 10/10/2024   K 3.5 10/10/2024   CL 106 10/10/2024   CO2 25 10/10/2024     iron deficiency anemia Hemoglobin stable as above, continuing on ferrous sulfate .   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   Diverticulitis of colon 05/01/2024   Diverticulosis 05/01/2024   Dysphagia 05/01/2024   Conjunctivitis 01/17/2024   S/P laparoscopic-assisted sigmoidectomy 10/11/2023   Cough 06/09/2023   Abdominal bloating with cramps 09/17/2022   Diverticulitis 04/23/2022   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   Fatigue 03/12/2021   Change in bowel habit 10/03/2020   Early satiety 10/03/2020   History of colonic polyps 10/03/2020   Screening for malignant neoplasm of colon 10/03/2020   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   Diarrhea 10/03/2020   Voice disturbance 03/12/2019   Coronary artery disease involving native coronary artery of native heart without angina pectoris 03/05/2019   Irritable bowel syndrome 03/14/2016   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   Asthma 11/19/2015   Sleep apnea 11/19/2015   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   rotator cuff tear 10/17/2013   Hypogonadism male 09/04/2013   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   ED (erectile dysfunction) 07/30/2013   Male erectile dysfunction, unspecified 07/30/2013   Biceps tendonitis 06/15/2013   Incomplete tear of rotator cuff 06/15/2013   Pain in upper limb 06/15/2013   Bilateral knee pain 11/07/2012   Right shoulder pain 11/07/2012   Palpitations 02/21/2012   BMI 33.0-33.9,adult 02/04/2012   Allergic rhinitis 02/04/2012   GERD (gastroesophageal reflux disease) 02/04/2012   Migraine 02/04/2012   GAD (generalized anxiety disorder) 02/04/2012   Insomnia 02/04/2012   Obesity 02/04/2012   Gastro-esophageal reflux disease without esophagitis 02/04/2012   Essential  hypertension 04/13/2009   CHEST PAIN-UNSPECIFIED 04/13/2009   Abdominal pain 04/13/2009   Past Medical History:  Diagnosis Date   Allergy    Anemia    Anxiety    Arthritis    Asthma    CAD (coronary artery disease) 03/04/2024   CHF (congestive heart failure) (HCC) 03/04/2024   EF 50%   Complication of anesthesia    oxygen levelds dropped  with colonoscopy   Depression    Diverticulitis    Dyspnea    sometimes with asthma has sob   GERD (gastroesophageal reflux disease)    Headache    Hyperlipidemia    Phreesia 12/05/2020   Hypertension    Pneumonia    Seasonal allergic reaction    Sleep apnea    Past Surgical History:  Procedure Laterality Date   CIRCUMCISION  10/2022   COLON SURGERY  10/11/2023   COLONOSCOPY  07/17/2022   FLEXIBLE SIGMOIDOSCOPY N/A 10/11/2023   Procedure: FLEXIBLE SIGMOIDOSCOPY;  Surgeon: Teresa Lonni HERO, MD;  Location: [facility];  Service: General;  Laterality: N/A;   LIPOMA EXCISION N/A 10/30/2018   Procedure: EXCISION OF MULTIPLE SUBCUTANEOUS LIPOMAS ON TORSO;  Surgeon: Belinda Cough, MD;  Location: [facility];  Service: General;  Laterality: N/A;   UPPER GASTROINTESTINAL ENDOSCOPY  07/17/2022   XI ROBOTIC ASSISTED LOWER ANTERIOR RESECTION N/A 10/11/2023   Procedure: XI ROBOTIC ASSISTED LOWER ANTERIOR RESECTION, SIGMOIDECTOMY WITH INTRAOPERATIVE ASSESSMENT OF PERFUSION ICG, BILATERAL TAP BLOCK;  Surgeon: Teresa Lonni HERO, MD;  Location: [facility];  Service: General;  Laterality: N/A;  180   Allergies  Allergen Reactions   Desloratadine Other (See Comments)    CLARINEX-severe headache CLARITIN - severe headache   Loratadine  Other (See Comments)    severe headache   Ketek [Telithromycin] Itching and Rash   Levbid  [Hyoscyamine  Sulfate] Rash   Prior to Admission medications   Medication Sig Start Date End Date Taking? Authorizing Provider  AIRSUPRA  90-80 MCG/ACT AERO Inhale 2 puffs into the lungs every 4 (four) hours as  needed. Patient taking differently: Inhale 2 puffs into the lungs 2 (two) times daily. 03/30/23  Yes [provider]  albuterol  (VENTOLIN  HFA) 108 (90 Base) MCG/ACT inhaler INHALE 2 PUFFS INTO THE LUNGS EVERY 6 HOURS AS NEEDED FOR WHEEZING OR SHORTNESS OF BREATH 07/19/20  Yes Levora Reyes SAUNDERS, MD  ALLEGRA  ALLERGY 180 MG tablet Take 180 mg by mouth daily. 07/12/20  Yes [provider]  ALPRAZolam  (XANAX ) 1 MG tablet TAKE 1 TABLET BY MOUTH TWICE DAILY AS NEEDED Patient taking differently: Take 1 mg by mouth 2 (two) times daily as needed for anxiety or sleep. 09/25/24  Yes Levora Reyes SAUNDERS, MD  anastrozole (ARIMIDEX) 1 MG tablet Take 0.5 mg by mouth once a week.   Yes [provider]  aspirin  EC 81 MG tablet Take 1 tablet (81 mg total) by mouth daily. 03/07/15  Yes Wonda Sharper, MD  Budeson-Glycopyrrol-Formoterol  (BREZTRI  AEROSPHERE) 160-9-4.8 MCG/ACT AERO Inhale 2 puffs into the lungs in the morning and at bedtime. 09/14/21  Yes [provider]  budesonide  (PULMICORT ) 0.5 MG/2ML nebulizer solution Take 0.5 mg by nebulization 2 (two) times daily as needed (wheezing / sob). 06/29/24  Yes [provider]  ciprofloxacin  (CIPRO ) 500 MG tablet Take 1 tablet (500 mg total) by mouth 2 (two) times daily for 10 days. 10/10/24 10/20/24 Yes [place], Camellia PARAS, DO  cyanocobalamin  (VITAMIN B12) 1000 MCG tablet Take 1 tablet (1,000 mcg total) by mouth daily. 05/04/24  Yes Levora Reyes SAUNDERS, MD  dicyclomine  (BENTYL ) 20 MG tablet Take 1 tablet (20 mg total) by mouth in the morning and at bedtime. Patient taking differently: Take 20 mg by mouth 2 (two) times daily as needed for spasms. 05/25/24  Yes [place], PA-C  doxycycline  (VIBRA -TABS) 100 MG tablet Take 2 tablets (200 mg total) by mouth daily as needed. Patient taking differently: Take 200 mg by mouth daily as needed (infection/exposure). 09/22/24  Yes Levora Reyes SAUNDERS, MD  DUPIXENT 300 MG/2ML prefilled syringe Inject 300  mg into the skin every 14 (fourteen) days. 10/06/21  Yes [provider]  emtricitabine -tenofovir  (TRUVADA ) 200-300 MG tablet TAKE 1 TABLET BY MOUTH DAILY 04/30/24  Yes Levora Reyes SAUNDERS, MD  famotidine (PEPCID) 40 MG tablet Take 40 mg by mouth daily as needed for heartburn or indigestion. 02/18/20  Yes [provider]  FEROSUL 325 (65 Fe) MG tablet TAKE 1 TABLET(325 MG) BY MOUTH DAILY WITH BREAKFAST 09/22/24  Yes Levora Reyes SAUNDERS, MD  furosemide  (LASIX ) 20 MG tablet Take 1 tablet (20 mg total) by mouth daily. As needed for edema Patient taking differently: Take 20 mg by mouth daily as needed for fluid or edema. 09/03/24  Yes Wonda Sharper, MD  guaiFENesin  (MUCINEX ) 600  MG 12 hr tablet Take 1 tablet (600 mg total) by mouth 2 (two) times daily for 14 days. 10/10/24 10/24/24 Yes [place], Eric J, DO  ipratropium (ATROVENT ) 0.06 % nasal spray Place 2 sprays into the nose 2 (two) times daily. 05/25/21  Yes Levora Reyes SAUNDERS, MD  metoprolol  succinate (TOPROL -XL) 50 MG 24 hr tablet Take 1 tablet (50 mg total) by mouth daily. Take with or immediately following a meal. Patient taking differently: Take 50 mg by mouth at bedtime. Take with or immediately following a meal. 09/03/24  Yes Wonda Sharper, MD  mirabegron ER (MYRBETRIQ) 25 MG TB24 tablet Take 25 mg by mouth daily as needed (bladder spasm).   Yes [provider]  montelukast  (SINGULAIR ) 10 MG tablet TAKE 1 TABLET(10 MG) BY MOUTH AT BEDTIME 03/23/24  Yes Levora Reyes SAUNDERS, MD  olopatadine  (PATADAY ) 0.1 % ophthalmic solution Place 1 drop into both eyes 2 (two) times daily as needed for allergies. 01/17/24  Yes Jerrell Cleatus Ned, MD  Olopatadine -Mometasone  ([name] ) [phone] MCG/ACT SUSP Place 1 spray into the nose in the morning and at bedtime.   Yes [provider]  ondansetron  (ZOFRAN -ODT) 4 MG disintegrating tablet Take 1 tablet (4 mg total) by mouth every 8 (eight) hours as needed for nausea or vomiting. 10/10/24   Yes [place], Eric J, DO  pantoprazole  (PROTONIX ) 40 MG tablet Take 1 tablet (40 mg total) by mouth daily. Patient taking differently: Take 40 mg by mouth daily as needed (acid reflux). 05/01/24  Yes Levora Reyes SAUNDERS, MD  Potassium Chloride  ER 20 MEQ TBCR TAKE 1 TABLET(20 MEQ) BY MOUTH DAILY 07/01/24  Yes Wonda Sharper, MD  rizatriptan  (MAXALT -MLT) 10 MG disintegrating tablet DISSOLVE 1 TABLET IN MOUTH AS NEEDED FOR HEADACHE. REPEAT  IN  2  HOURS  AS  NEEDED. 09/25/24  Yes Levora Reyes SAUNDERS, MD  rosuvastatin  (CRESTOR ) 10 MG tablet Take 1 tablet (10 mg total) by mouth daily. 09/03/24  Yes Wonda Sharper, MD  sildenafil  (VIAGRA ) 100 MG tablet Take 1/2-1 tablet by mouth once daily as needed prior to sexual activity *MUST KEEP UPCOMING APPT* 04/02/23  Yes Wonda Sharper, MD  tadalafil  (CIALIS ) 5 MG tablet Take 5 mg by mouth daily at 12 noon. 02/01/23  Yes [provider]  tamsulosin  (FLOMAX ) 0.4 MG CAPS capsule TAKE 1 CAPSULE(0.4 MG) BY MOUTH DAILY AS NEEDED Patient taking differently: Take 0.4 mg by mouth daily after supper. 09/17/24  Yes Levora Reyes SAUNDERS, MD  testosterone  cypionate (DEPOTESTOSTERONE CYPIONATE) 200 MG/ML injection Inject 1 mL into the muscle once a week. 07/07/22  Yes [provider]  valsartan  (DIOVAN ) 80 MG tablet Take 1 tablet (80 mg total) by mouth daily. 09/03/24  Yes Wonda Sharper, MD  Vitamin D , Ergocalciferol , (DRISDOL ) 1.25 MG (50000 UNIT) CAPS capsule TAKE ONE CAPSULE BY MOUTH EVERY 7 DAYS Patient taking differently: TAKE ONE CAPSULE BY MOUTH EVERY 7 DAYS Friday 08/13/24  Yes Levora Reyes SAUNDERS, MD  Vonoprazan Fumarate  (VOQUEZNA ) 10 MG TABS Take 1 tablet by mouth daily. 07/14/24  Yes Armbruster, Elspeth SQUIBB, MD  zolpidem  (AMBIEN ) 10 MG tablet TAKE 1 TABLET(10 MG) BY MOUTH AT BEDTIME 07/21/24  Yes Levora Reyes SAUNDERS, MD  dicyclomine  (BENTYL ) 20 MG tablet Take 20 mg by mouth 3 (three) times daily before meals.    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)   Occupation: [employer]  Tobacco Use   Smoking status: Never   Smokeless tobacco: Never  Vaping Use   Vaping status: Never Used  Substance and Sexual Activity   Alcohol use: Yes    Alcohol/week: 1.0 standard drink of alcohol    Types: 1 Standard drinks or equivalent per week    Comment: social-monthly   Drug use: Never   Sexual activity: Not Currently    Birth control/protection: Condom, Pill    Comment: Doxycycline  for non-protected sexual contact  Other Topics Concern   Not on file  Social History Narrative   Not on file   Social Drivers of Health   Financial Resource Strain: Low Risk  (09/01/2024)   Overall Financial Resource Strain (CARDIA)    Difficulty of Paying Living Expenses: Not very hard  Food Insecurity: No Food Insecurity (10/07/2024)   Hunger Vital Sign    Worried About Running Out of Food in the Last Year: Never true    Ran Out of Food in the Last Year: Never true  Transportation Needs: Patient Declined (10/07/2024)   PRAPARE - Transportation    Lack of Transportation (Medical): Patient declined    Lack of Transportation (Non-Medical): Patient declined  Physical Activity: Sufficiently Active (09/01/2024)   Exercise Vital Sign    Days of Exercise per Week: 3 days    Minutes of Exercise per Session: 120 min  Stress: Stress Concern Present (09/01/2024)   [employer] of Occupational Health - Occupational Stress Questionnaire    Feeling of Stress: To some extent  Social Connections: Moderately Integrated (09/01/2024)   Social Connection and Isolation Panel    Frequency of Communication with Friends and Family: More than three times a week    Frequency  of Social Gatherings with Friends and Family: Once a week    Attends Religious Services: More than 4 times per year    Active Member of [employer] or Organizations: Yes    Attends Banker Meetings: More than 4 times per year    Marital Status: Never married  Intimate Partner Violence: Not At Risk (10/07/2024)   Humiliation, Afraid, Rape, and Kick questionnaire    Fear of Current or Ex-Partner: No    Emotionally Abused: No    Physically Abused: No    Sexually Abused: No    Review of Systems Per HPI.   Objective:   Vitals:   10/14/24 1116 10/14/24 1139  BP: 94/60 102/60  Pulse: 77   Resp: 15   Temp: 98.4 F (36.9 C)   TempSrc: Temporal   SpO2: 96%   Weight: 264 lb 9.6 oz (120 kg)   Height: 5' 11 (1.803 m)    BP Readings from Last 3 Encounters:  10/14/24 102/60  10/10/24 127/76  09/11/24 111/71      Physical Exam Vitals reviewed.  Constitutional:      Appearance: Normal appearance. He is well-developed. He is not ill-appearing, toxic-appearing or diaphoretic.     Comments: Uncomfortable with exam but nontoxic-appearing.  HENT:     Head: Normocephalic and atraumatic.  Neck:     Vascular: No carotid bruit or JVD.  Cardiovascular:     Rate and Rhythm: Normal rate and regular rhythm.     Heart sounds: Normal heart sounds. No murmur heard. Pulmonary:     Effort: Pulmonary effort  is normal.     Breath sounds: Normal breath sounds. No rales.  Abdominal:     Tenderness: There is abdominal tenderness (Some guarding left lower, right lower quadrant, tender diffusely within abdomen, sparing only epigastrium.  Distant bowel sounds.). There is guarding.  Musculoskeletal:     Right lower leg: No edema.     Left lower leg: No edema.  Skin:    General: Skin is warm and dry.  Neurological:     Mental Status: He is alert and oriented to person, place, and time.  Psychiatric:        Mood and Affect: Mood normal.        Behavior: Behavior normal.         Assessment & Plan:  Randall Reed. is a 53 y.o. male . Colitis presumed infectious  E coli infection  Infection, Shigella  Generalized abdominal pain  Urinary hesitancy  Fatigue, unspecified type  Hypokalemia  Hypophosphatemia   Colitis with positive Shigella, E. coli testing on GI pathogen panel.  Has been compliant with Cipro  treatment at home.  Possible slight improvement in symptoms few days ago, but unfortunately has worsened in the past 3 days with increasing pain, increasing diarrhea and although he is drinking fluids, blood pressure is on the low side today.  Question secondary volume depletion, hypotension.  He is also having some symptoms concerning for urinary retention.  Given worsening clinical course, I do think that evaluation through ER would be appropriate this time for further testing, decision on repeat imaging and evaluation.  Antibiotic treatment.  Additionally may need some imaging or ultrasound to rule out urinary retention.  Prior repleted hypokalemia and hypophosphatemia can be rechecked on labs through ER.  I am happy to see him after ER if deemed stable for home treatment or in hospital follow-up if he is again admitted.  Plan discussed with patient with all questions answered. No orders of the defined types were placed in this encounter.  Patient Instructions  Sorry to hear that you are still feeling rough.  I am concerned about the worsening of symptoms the past few days along with your new urinary symptoms, and blood pressure on the low side here today.  You may need to come off of blood pressure medications temporarily, or just may need some IV fluids as well as further workup.  Given worsening symptoms and your previous infection/colitis, I do recommend emergency room evaluation today to decide on further testing, imaging if needed and to evaluate the urinary hesitancy further to make sure you are not having urinary retention.  Please let me  know if there are questions and I am happy to see you after the ER if needed or if they hospitalize you again happy to see you for hospital follow-up.  Hang in there.    Signed,   Reyes Pines, MD [facility], [facility] 10/14/24 12:20 PM

## 2024-10-14 NOTE — ED Provider Notes (Signed)
 [place] [facility] AT [facility] Provider Note   CSN: [id number] Arrival date & time: 10/14/24  1356     Patient presents with: Diarrhea   Randall Reed. is a 53 y.o. male.    Diarrhea Patient presents with diarrhea abdominal pain.  Recent admission to hospital for same.  Discharged a few days ago.  Had Shigella and E. coli colitis.  Currently on Cipro .  Has had decreased oral intake.  States decreased appetite and having nausea.  States stool had gone from very heavy diarrhea to a formed stool on Monday, 2 days ago.  However now diarrhea started again.  States now more mucousy.  Also has had some difficulty urinating.  Feels if he has to go and cannot really go.  Not having fevers.  Saw PCP and was sent in for further evaluation.   Past Medical History:  Diagnosis Date   Allergy    Anemia    Anxiety    Arthritis    Asthma    CAD (coronary artery disease) 03/04/2024   CHF (congestive heart failure) (HCC) 03/04/2024   EF 50%   Complication of anesthesia    oxygen levelds dropped  with colonoscopy   Depression    Diverticulitis    Dyspnea    sometimes with asthma has sob   GERD (gastroesophageal reflux disease)    Headache    Hyperlipidemia    Phreesia 12/05/2020   Hypertension    Pneumonia    Seasonal allergic reaction    Sleep apnea       Past Surgical History:  Procedure Laterality Date   CIRCUMCISION  10/2022   COLON SURGERY  10/11/2023   COLONOSCOPY  07/17/2022   FLEXIBLE SIGMOIDOSCOPY N/A 10/11/2023   Procedure: FLEXIBLE SIGMOIDOSCOPY;  Surgeon: Teresa Lonni HERO, MD;  Location: [facility];  Service: General;  Laterality: N/A;   LIPOMA EXCISION N/A 10/30/2018   Procedure: EXCISION OF MULTIPLE SUBCUTANEOUS LIPOMAS ON TORSO;  Surgeon: Belinda Cough, MD;  Location: [facility];  Service: General;  Laterality: N/A;   UPPER GASTROINTESTINAL ENDOSCOPY  07/17/2022   XI ROBOTIC ASSISTED LOWER ANTERIOR RESECTION N/A  10/11/2023   Procedure: XI ROBOTIC ASSISTED LOWER ANTERIOR RESECTION, SIGMOIDECTOMY WITH INTRAOPERATIVE ASSESSMENT OF PERFUSION ICG, BILATERAL TAP BLOCK;  Surgeon: Teresa Lonni HERO, MD;  Location: [facility];  Service: General;  Laterality: N/A;  180    Prior to Admission medications   Medication Sig Start Date End Date Taking? Authorizing Provider  AIRSUPRA  90-80 MCG/ACT AERO Inhale 2 puffs into the lungs every 4 (four) hours as needed. Patient taking differently: Inhale 2 puffs into the lungs 2 (two) times daily. 03/30/23   [provider]  albuterol  (VENTOLIN  HFA) 108 (90 Base) MCG/ACT inhaler INHALE 2 PUFFS INTO THE LUNGS EVERY 6 HOURS AS NEEDED FOR WHEEZING OR SHORTNESS OF BREATH 07/19/20   Levora Reyes SAUNDERS, MD  ALLEGRA  ALLERGY 180 MG tablet Take 180 mg by mouth daily. 07/12/20   [provider]  ALPRAZolam  (XANAX ) 1 MG tablet TAKE 1 TABLET BY MOUTH TWICE DAILY AS NEEDED Patient taking differently: Take 1 mg by mouth 2 (two) times daily as needed for anxiety or sleep. 09/25/24   Levora Reyes SAUNDERS, MD  anastrozole (ARIMIDEX) 1 MG tablet Take 0.5 mg by mouth once a week.    [provider]  aspirin  EC 81 MG tablet Take 1 tablet (81 mg total) by mouth daily. 03/07/15   Wonda Sharper, MD  Budeson-Glycopyrrol-Formoterol  (BREZTRI  AEROSPHERE) 160-9-4.8  MCG/ACT AERO Inhale 2 puffs into the lungs in the morning and at bedtime. 09/14/21   [provider]  budesonide  (PULMICORT ) 0.5 MG/2ML nebulizer solution Take 0.5 mg by nebulization 2 (two) times daily as needed (wheezing / sob). 06/29/24   [provider]  ciprofloxacin  (CIPRO ) 500 MG tablet Take 1 tablet (500 mg total) by mouth 2 (two) times daily for 10 days. 10/10/24 10/20/24  [place], Eric J, DO  cyanocobalamin  (VITAMIN B12) 1000 MCG tablet Take 1 tablet (1,000 mcg total) by mouth daily. 05/04/24   Levora Reyes SAUNDERS, MD  dicyclomine  (BENTYL ) 20 MG tablet Take 1 tablet (20 mg total) by mouth in the morning  and at bedtime. Patient taking differently: Take 20 mg by mouth 2 (two) times daily as needed for spasms. 05/25/24   [place], PA-C  doxycycline  (VIBRA -TABS) 100 MG tablet Take 2 tablets (200 mg total) by mouth daily as needed. Patient taking differently: Take 200 mg by mouth daily as needed (infection/exposure). 09/22/24   Levora Reyes SAUNDERS, MD  DUPIXENT 300 MG/2ML prefilled syringe Inject 300 mg into the skin every 14 (fourteen) days. 10/06/21   [provider]  emtricitabine -tenofovir  (TRUVADA ) 200-300 MG tablet TAKE 1 TABLET BY MOUTH DAILY 04/30/24   Levora Reyes SAUNDERS, MD  famotidine (PEPCID) 40 MG tablet Take 40 mg by mouth daily as needed for heartburn or indigestion. 02/18/20   [provider]  FEROSUL 325 (65 Fe) MG tablet TAKE 1 TABLET(325 MG) BY MOUTH DAILY WITH BREAKFAST 09/22/24   Levora Reyes SAUNDERS, MD  furosemide  (LASIX ) 20 MG tablet Take 1 tablet (20 mg total) by mouth daily. As needed for edema Patient taking differently: Take 20 mg by mouth daily as needed for fluid or edema. 09/03/24   Wonda Sharper, MD  guaiFENesin  (MUCINEX ) 600 MG 12 hr tablet Take 1 tablet (600 mg total) by mouth 2 (two) times daily for 14 days. 10/10/24 10/24/24  [place], Eric J, DO  ipratropium (ATROVENT ) 0.06 % nasal spray Place 2 sprays into the nose 2 (two) times daily. 05/25/21   Levora Reyes SAUNDERS, MD  metoprolol  succinate (TOPROL -XL) 50 MG 24 hr tablet Take 1 tablet (50 mg total) by mouth daily. Take with or immediately following a meal. Patient taking differently: Take 50 mg by mouth at bedtime. Take with or immediately following a meal. 09/03/24   Wonda Sharper, MD  mirabegron ER (MYRBETRIQ) 25 MG TB24 tablet Take 25 mg by mouth daily as needed (bladder spasm).    [provider]  montelukast  (SINGULAIR ) 10 MG tablet TAKE 1 TABLET(10 MG) BY MOUTH AT BEDTIME 03/23/24   Levora Reyes SAUNDERS, MD  olopatadine  (PATADAY ) 0.1 % ophthalmic solution Place 1 drop into both eyes 2 (two)  times daily as needed for allergies. 01/17/24   Jerrell Cleatus Ned, MD  Olopatadine -Mometasone  ([name] ) [phone] MCG/ACT SUSP Place 1 spray into the nose in the morning and at bedtime.    [provider]  ondansetron  (ZOFRAN -ODT) 4 MG disintegrating tablet Take 1 tablet (4 mg total) by mouth every 8 (eight) hours as needed for nausea or vomiting. 10/10/24   [place], Camellia PARAS, DO  pantoprazole  (PROTONIX ) 40 MG tablet Take 1 tablet (40 mg total) by mouth daily. Patient taking differently: Take 40 mg by mouth daily as needed (acid reflux). 05/01/24   Levora Reyes SAUNDERS, MD  Potassium Chloride  ER 20 MEQ TBCR TAKE 1 TABLET(20 MEQ) BY MOUTH DAILY 07/01/24   Wonda Sharper, MD  rizatriptan  (MAXALT -MLT) 10 MG disintegrating tablet DISSOLVE  1 TABLET IN MOUTH AS NEEDED FOR HEADACHE. REPEAT  IN  2  HOURS  AS  NEEDED. 09/25/24   Levora Reyes SAUNDERS, MD  rosuvastatin  (CRESTOR ) 10 MG tablet Take 1 tablet (10 mg total) by mouth daily. 09/03/24   Wonda Sharper, MD  sildenafil  (VIAGRA ) 100 MG tablet Take 1/2-1 tablet by mouth once daily as needed prior to sexual activity *MUST KEEP UPCOMING APPT* 04/02/23   Wonda Sharper, MD  tadalafil  (CIALIS ) 5 MG tablet Take 5 mg by mouth daily at 12 noon. 02/01/23   [provider]  tamsulosin  (FLOMAX ) 0.4 MG CAPS capsule TAKE 1 CAPSULE(0.4 MG) BY MOUTH DAILY AS NEEDED Patient taking differently: Take 0.4 mg by mouth daily after supper. 09/17/24   Levora Reyes SAUNDERS, MD  testosterone  cypionate (DEPOTESTOSTERONE CYPIONATE) 200 MG/ML injection Inject 1 mL into the muscle once a week. 07/07/22   [provider]  valsartan  (DIOVAN ) 80 MG tablet Take 1 tablet (80 mg total) by mouth daily. 09/03/24   Wonda Sharper, MD  Vitamin D , Ergocalciferol , (DRISDOL ) 1.25 MG (50000 UNIT) CAPS capsule TAKE ONE CAPSULE BY MOUTH EVERY 7 DAYS Patient taking differently: TAKE ONE CAPSULE BY MOUTH EVERY 7 DAYS Friday 08/13/24   Levora Reyes SAUNDERS, MD  Vonoprazan Fumarate  (VOQUEZNA )  10 MG TABS Take 1 tablet by mouth daily. 07/14/24   Armbruster, Elspeth SQUIBB, MD  zolpidem  (AMBIEN ) 10 MG tablet TAKE 1 TABLET(10 MG) BY MOUTH AT BEDTIME 07/21/24   Levora Reyes SAUNDERS, MD  dicyclomine  (BENTYL ) 20 MG tablet Take 20 mg by mouth 3 (three) times daily before meals.    [provider]    Allergies: Desloratadine, Loratadine , Ketek [telithromycin], and Levbid  [hyoscyamine  sulfate]    Review of Systems  Gastrointestinal:  Positive for diarrhea.    Updated Vital Signs BP 133/77 (BP Location: Right Arm)   Pulse 63   Temp 98.9 F (37.2 C) (Oral)   Resp 14   Ht 5' 11 (1.803 m)   Wt 115.2 kg   SpO2 94%   BMI 35.43 kg/m   Physical Exam Vitals and nursing note reviewed.  Cardiovascular:     Rate and Rhythm: Regular rhythm.  Abdominal:     Tenderness: There is abdominal tenderness.     Comments: Diffuse abdominal tenderness no rebound or guarding.  No hernia palpated.  Musculoskeletal:     Cervical back: Neck supple.  Skin:    Capillary Refill: Capillary refill takes less than 2 seconds.  Neurological:     Mental Status: He is alert and oriented to person, place, and time.     (all labs ordered are listed, but only abnormal results are displayed) Labs Reviewed  LIPASE, BLOOD - Abnormal; Notable for the following components:      Result Value   Lipase 132 (*)    All other components within normal limits  CBC - Abnormal; Notable for the following components:   RBC 6.37 (*)    Hemoglobin 17.4 (*)    HCT 53.3 (*)    RDW 17.5 (*)    All other components within normal limits  URINALYSIS, ROUTINE W REFLEX MICROSCOPIC - Abnormal; Notable for the following components:   Color, Urine STRAW (*)    Specific Gravity, Urine 1.004 (*)    All other components within normal limits  COMPREHENSIVE METABOLIC PANEL WITH GFR    EKG: None  Radiology: CT ABDOMEN PELVIS W CONTRAST Result Date: 10/14/2024 EXAM: CT ABDOMEN AND PELVIS WITH CONTRAST 10/14/2024 07:58:50 PM  TECHNIQUE: CT of  the abdomen and pelvis was performed with the administration of 100 mL of iohexol  (OMNIPAQUE ) 300 MG/ML solution. Multiplanar reformatted images are provided for review. Automated exposure control, iterative reconstruction, and/or weight-based adjustment of the mA/kV was utilized to reduce the radiation dose to as low as reasonably achievable. COMPARISON: CT with iv contrast 10/07/2024, CT with iv contrast 05/19/2024, and CT abdomen and pelvis 05/26/2021. CLINICAL HISTORY: Diarrhea. Patient was recently admitted with colitis and is also having difficulty urinating. FINDINGS: LOWER CHEST: There is an elevated right hemidiaphragm. Lung bases are clear of infiltrates. The cardiac size is normal. There is scattered calcification in the proximal LAD coronary artery. LIVER: The liver is mildly steatotic. There is a 2.9 cm septated cyst in segment 2, stable back to CT abdomen and pelvis from 05/26/2021. Consistent with a benign cyst. There is no mass enhancement. GALLBLADDER AND BILE DUCTS: Gallbladder is unremarkable. No biliary ductal dilatation. SPLEEN: The spleen is mildly prominent, measuring 14 cm in length. No mass. PANCREAS: No acute abnormality. ADRENAL GLANDS: No acute abnormality. There is no adrenal mass. KIDNEYS, URETERS AND BLADDER: No stones in the kidneys or ureters. No hydronephrosis. No perinephric or periureteral stranding. The bladder is unremarkable for the degree of distention. There is a mild impression into the bladder base by the enlarged prostate. There is no renal mass. GI AND BOWEL: Stomach demonstrates no acute abnormality. The gastric wall and unopacified small bowel are unremarkable, as visualized. The appendix is more prominent than on the last CT, measuring 10 mm, but is similar to 05/19/2024. There are no inflammatory changes. Clinical correlation recommended for potential early appendicitis. There is no longer any wall thickening in the ascending and transverse colon. The  rectosigmoid surgical anastomosis is intact. Mild increased stranding is seen along the distal descending and rectosigmoid colon with either mild wall prominence or changes of underdistention. The findings are concerning for proctocolitis given the increased adjacent stranding. There is no bowel obstruction. PERITONEUM AND RETROPERITONEUM: No ascites. No free air. There are small umbilical and inguinal fat hernias but no incarcerated hernia. VASCULATURE: Aorta is normal in caliber. There is mild aortic atherosclerosis with no aneurysm or dissection. LYMPH NODES: No lymphadenopathy. REPRODUCTIVE ORGANS: The prostate is enlarged, measuring 5.5 cm transverse. There is a mild impression into the bladder base by the enlarged prostate. BONES AND SOFT TISSUES: There are degenerative changes of the thoracic and lumbar spine with no acute or significant osseous findings. No focal soft tissue abnormality. IMPRESSION: 1. Mild increased stranding along the distal descending and rectosigmoid colon, concerning for proctocolitis. 2. Prominent appendix measuring 10 mm, similar to 05/19/2024 but more prominent than 1 week ago , without inflammatory changes. Suggest clinical correlation for early appendicitis. 3. Enlarged prostate measuring 5.5 cm transverse with mild impression into the bladder base. 4. Aortic and coronary artery atherosclerosis. Electronically signed by: Francis Quam MD 10/14/2024 08:22 PM EST RP Workstation: [id number]     Procedures   Medications Ordered in the ED  sodium chloride  0.9 % bolus 1,000 mL (1,000 mLs Intravenous New Bag/Given 10/14/24 1828)  fentaNYL  (SUBLIMAZE ) injection 50 mcg (50 mcg Intravenous Given 10/14/24 1947)  lactated ringers  bolus 1,000 mL (1,000 mLs Intravenous New Bag/Given 10/14/24 2034)  iohexol  (OMNIPAQUE ) 300 MG/ML solution 100 mL (100 mLs Intravenous Contrast Given 10/14/24 1950)  Medical Decision Making Amount and/or Complexity of  Data Reviewed Labs: ordered. Radiology: ordered.  Risk Prescription drug management. Decision regarding hospitalization.   Patient with abdominal pain.  Recent admission for colitis.  Diarrhea had improved but then recurred again.  Decreased oral intake.  Feeling somewhat weak.  Worse abdominal pain.  Will repeat CT to evaluate.  Reviewed PCPs note.  Also with some difficulty urinating.  Urine does not show infection.  However bedside ultrasound done 20 minutes after urinating and did not show a large amount of urinary retention.  Will give a IV fluids.  White count normal.  Hemoglobin is gone from 14 up to 17 showing likely dehydration.  Although CMP is reassuring.  Lipase mildly elevated.  CT scan done and shows now a proctocolitis worse than prior.  Other colitis is cleared up.  Although appendix is somewhat larger.  Has been this large before although not on recent scan.  Doubt appendicitis.  With increased pain decreased oral intake I think patient benefit from admission in the hospital for further evaluation.  Potentially could need GI or infectious disease.  Will discuss with hospitalist for admission.      Final diagnoses:  Proctocolitis    ED Discharge Orders     None          Patsey Lot, MD 10/14/24 2054

## 2024-10-14 NOTE — Patient Instructions (Signed)
 Sorry to hear that you are still feeling rough.  I am concerned about the worsening of symptoms the past few days along with your new urinary symptoms, and blood pressure on the low side here today.  You may need to come off of blood pressure medications temporarily, or just may need some IV fluids as well as further workup.  Given worsening symptoms and your previous infection/colitis, I do recommend emergency room evaluation today to decide on further testing, imaging if needed and to evaluate the urinary hesitancy further to make sure you are not having urinary retention.  Please let me know if there are questions and I am happy to see you after the ER if needed or if they hospitalize you again happy to see you for hospital follow-up.  Hang in there.

## 2024-10-15 LAB — CBC
HCT: 47.3 % (ref 39.0–52.0)
Hemoglobin: 15.8 g/dL (ref 13.0–17.0)
MCH: 27.3 pg (ref 26.0–34.0)
MCHC: 33.4 g/dL (ref 30.0–36.0)
MCV: 81.7 fL (ref 80.0–100.0)
Platelets: 237 K/uL (ref 150–400)
RBC: 5.79 MIL/uL (ref 4.22–5.81)
RDW: 16.9 % — ABNORMAL HIGH (ref 11.5–15.5)
WBC: 7.1 K/uL (ref 4.0–10.5)
nRBC: 0 % (ref 0.0–0.2)

## 2024-10-15 LAB — COMPREHENSIVE METABOLIC PANEL WITH GFR
ALT: 22 U/L (ref 0–44)
AST: 19 U/L (ref 15–41)
Albumin: 3.6 g/dL (ref 3.5–5.0)
Alkaline Phosphatase: 76 U/L (ref 38–126)
Anion gap: 10 (ref 5–15)
BUN: 6 mg/dL (ref 6–20)
CO2: 22 mmol/L (ref 22–32)
Calcium: 8.6 mg/dL — ABNORMAL LOW (ref 8.9–10.3)
Chloride: 105 mmol/L (ref 98–111)
Creatinine, Ser: 1.14 mg/dL (ref 0.61–1.24)
GFR, Estimated: >60 mL/min (ref >60)
Glucose, Bld: 99 mg/dL (ref 70–99)
Potassium: 3.7 mmol/L (ref 3.5–5.1)
Sodium: 137 mmol/L (ref 135–145)
Total Bilirubin: 0.5 mg/dL (ref 0.0–1.2)
Total Protein: 6.1 g/dL — ABNORMAL LOW (ref 6.5–8.1)

## 2024-10-15 MED ORDER — ALBUTEROL SULFATE (2.5 MG/3ML) 0.083% IN NEBU
2.5000 mg | INHALATION_SOLUTION | Freq: Two times a day (BID) | RESPIRATORY_TRACT | Status: DC
  Administered 2024-10-15: 2.5 mg via RESPIRATORY_TRACT
  Filled 2024-10-15: qty 3

## 2024-10-15 MED ORDER — TAMSULOSIN HCL 0.4 MG PO CAPS
0.4000 mg | ORAL_CAPSULE | Freq: Every day | ORAL | Status: DC
  Administered 2024-10-15 – 2024-10-16 (×3): 0.4 mg via ORAL
  Filled 2024-10-15 (×3): qty 1

## 2024-10-15 MED ORDER — BUDESON-GLYCOPYRROL-FORMOTEROL 160-9-4.8 MCG/ACT IN AERO
2.0000 | INHALATION_SPRAY | Freq: Two times a day (BID) | RESPIRATORY_TRACT | Status: DC
  Administered 2024-10-15 – 2024-10-17 (×5): 2 via RESPIRATORY_TRACT
  Filled 2024-10-15: qty 5.9

## 2024-10-15 MED ORDER — SODIUM CHLORIDE 0.9 % IV SOLN
12.5000 mg | Freq: Once | INTRAVENOUS | Status: AC
  Administered 2024-10-15: 12.5 mg via INTRAVENOUS
  Filled 2024-10-15: qty 12.5

## 2024-10-15 MED ORDER — BUDESONIDE 0.25 MG/2ML IN SUSP
0.2500 mg | Freq: Two times a day (BID) | RESPIRATORY_TRACT | Status: DC
  Administered 2024-10-15: 0.25 mg via RESPIRATORY_TRACT
  Filled 2024-10-15: qty 2

## 2024-10-15 MED ORDER — ALBUTEROL SULFATE (2.5 MG/3ML) 0.083% IN NEBU
2.5000 mg | INHALATION_SOLUTION | RESPIRATORY_TRACT | Status: DC | PRN
  Filled 2024-10-15: qty 3

## 2024-10-15 MED ADMIN — Zolpidem Tartrate Tab 5 MG: 10 mg | ORAL | NDC 00904608261

## 2024-10-15 MED FILL — Zolpidem Tartrate Tab 5 MG: 10.0000 mg | ORAL | Qty: 2 | Status: AC

## 2024-10-15 NOTE — TOC Initial Note (Signed)
 Transition of Care (TOC) - Initial/Assessment Note    Patient Details  Name: Randall Reed. MRN: [id number] Date of Birth: 03/31/1971  Transition of Care [facility]) CM/SW Contact:    Sonda Manuella Quill, RN Phone Number: 10/15/2024, 12:12 PM  Clinical Narrative:                 Spoke w/ pt in room; pt said he lives at home; he plans to return w/ family support at d/c; he identified POC mother Gilberto Stanforth [phone]); she will provide transportation; insurance/PCP verified; he denied experiencing SDOH risks; he does not have DME, HH services, or home oxygen; IP CM is following.  Expected Discharge Plan: Home/Self Care Barriers to Discharge: Continued Medical Work up   Patient Goals and CMS Choice Patient states their goals for this hospitalization and ongoing recovery are:: home     [place] ownership interest in [facility].provided to:: Patient    Expected Discharge Plan and Services   Discharge Planning Services: CM Consult   Living arrangements for the past 2 months: Apartment                 DME Arranged: N/A DME Agency: NA       HH Arranged: NA HH Agency: NA        Prior Living Arrangements/Services Living arrangements for the past 2 months: Apartment Lives with:: Self Patient language and need for interpreter reviewed:: Yes Do you feel safe going back to the place where you live?: Yes      Need for Family Participation in Patient Care: Yes (Comment) Care giver support system in place?: Yes (comment) Current home services:  (n/a) Criminal Activity/Legal Involvement Pertinent to Current Situation/Hospitalization: No - Comment as needed  Activities of Daily Living   ADL Screening (condition at time of admission) Independently performs ADLs?: Yes (appropriate for developmental age) Is the patient deaf or have difficulty hearing?: No Does the patient have difficulty seeing, even when wearing glasses/contacts?: No Does the patient have  difficulty concentrating, remembering, or making decisions?: No  Permission Sought/Granted Permission sought to share information with : Case Manager Permission granted to share information with : Yes, Verbal Permission Granted  Share Information with NAME: Case Manager     Permission granted to share info w Relationship: Gaylen Venning (mother) [phone]     Emotional Assessment Appearance:: Appears stated age Attitude/Demeanor/Rapport: Gracious Affect (typically observed): Accepting Orientation: : Oriented to Self, Oriented to Place, Oriented to  Time, Oriented to Situation Alcohol / Substance Use: Not Applicable Psych Involvement: No (comment)  Admission diagnosis:  Proctocolitis [K52.9] Acute colitis [K52.9] Patient Active Problem List   Diagnosis Date Noted   Acute colitis 10/14/2024   Colitis presumed infectious 10/07/2024   IDA (iron deficiency anemia) 10/05/2024   Acute diverticulitis 05/01/2024   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   Diverticulitis of colon 05/01/2024   Diverticulosis 05/01/2024   Dysphagia 05/01/2024   Conjunctivitis 01/17/2024   S/P laparoscopic-assisted sigmoidectomy 10/11/2023   Cough 06/09/2023   Abdominal bloating with cramps 09/17/2022   Diverticulitis 04/23/2022   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   Fatigue 03/12/2021   Change in bowel habit 10/03/2020   Early satiety 10/03/2020   History of colonic polyps 10/03/2020   Screening for malignant neoplasm of colon 10/03/2020   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   Diarrhea 10/03/2020   Voice disturbance 03/12/2019   Coronary artery disease involving native coronary artery of native heart without angina pectoris 03/05/2019   Irritable bowel syndrome 03/14/2016  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   Asthma 11/19/2015   Sleep apnea 11/19/2015   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   rotator cuff tear 10/17/2013   Hypogonadism  male 09/04/2013   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   ED (erectile dysfunction) 07/30/2013   Male erectile dysfunction, unspecified 07/30/2013   Biceps tendonitis 06/15/2013   Incomplete tear of rotator cuff 06/15/2013   Pain in upper limb 06/15/2013   Bilateral knee pain 11/07/2012   Right shoulder pain 11/07/2012   Palpitations 02/21/2012   BMI 33.0-33.9,adult 02/04/2012   Allergic rhinitis 02/04/2012   GERD (gastroesophageal reflux disease) 02/04/2012   Migraine 02/04/2012   GAD (generalized anxiety disorder) 02/04/2012   Insomnia 02/04/2012   Obesity 02/04/2012   Gastro-esophageal reflux disease without esophagitis 02/04/2012   Essential hypertension 04/13/2009   CHEST PAIN-UNSPECIFIED 04/13/2009   Abdominal pain 04/13/2009   PCP:  Levora Reyes SAUNDERS, MD Pharmacy:   [facility]) [phone] - [place], [place] - [address] AT [address] [place] [address] Phone: [phone] Fax: [phone]  [id number] [place], [place] - [address] AT [facility] OF [address] [place] [address] Phone: [phone] Fax: [phone]  [facility] [address], [place] - [address] [place] [address] Phone: [phone] Fax: [phone]  [facility] [place], [place] - [address] [facility] [address] [place] [address] Phone: [phone] Fax: [phone]  [facility] [address] [place], [place] - [address] AT [facility] OF [address] [place] [place] [address] Phone: [phone] Fax: [phone]  [facility] [place] - [phone] [place] [address] Phone: [phone] Fax: [phone]  [place] [facility] [address] [place] [address] Phone: [phone] Fax:  [phone]  [facility] [place] [facility] [address] [place] [address] Phone: [phone] Fax: [phone]     Social Drivers of Health (SDOH) Social History: SDOH Screenings   Food Insecurity: No Food Insecurity (10/15/2024)  Housing: Low Risk (10/15/2024)  Transportation Needs: No Transportation Needs (10/15/2024)  Utilities: Not At Risk (10/15/2024)  Alcohol Screen: Low Risk (09/01/2024)  Depression (PHQ2-9): Low Risk (09/03/2024)  Financial Resource Strain: Low Risk (09/01/2024)  Physical Activity: Sufficiently Active (09/01/2024)  Social Connections: Moderately Integrated (09/01/2024)  Stress: Stress Concern Present (09/01/2024)  Tobacco Use: Low Risk (10/14/2024)   SDOH Interventions: Food Insecurity Interventions: Intervention Not Indicated, Inpatient TOC Housing Interventions: Intervention Not Indicated, Inpatient TOC Transportation Interventions: Intervention Not Indicated, Inpatient TOC Utilities Interventions: Intervention Not Indicated, Inpatient TOC   Readmission Risk Interventions    10/15/2024   12:09 PM  Readmission Risk Prevention Plan  Transportation Screening Complete  PCP or Specialist Appt within 5-7 Days Complete  Home Care Screening Complete  Medication Review (RN CM) Complete

## 2024-10-15 NOTE — Plan of Care (Signed)
   Problem: Clinical Measurements: Goal: Ability to maintain clinical measurements within normal limits will improve Outcome: Progressing Goal: Will remain free from infection Outcome: Progressing Goal: Diagnostic test results will improve Outcome: Progressing Goal: Respiratory complications will improve Outcome: Progressing Goal: Cardiovascular complication will be avoided Outcome: Progressing   Problem: Coping: Goal: Level of anxiety will decrease Outcome: Progressing

## 2024-10-15 NOTE — Progress Notes (Signed)
 [facility] PROGRESS NOTE   Randall Reed. FMW:[id number] DOB: Oct 12, 1971 DOA: 10/14/2024  PCP: Levora Reyes SAUNDERS, MD  Brief History: 53 y.o. male with medical history significant of for the history of anxiety disorder, essential hypertension, hyperlipidemia, previous colectomy due to diverticulitis, who was recently admitted with abdominal pain nausea vomiting secondary to Shigella and E. coli infection by PCR of the stool.  Patient was treated with IV antibiotics and discharged on oral ciprofloxacin .  Initially patient felt better but went home then couple of days later symptoms returned in full force.  Patient has been having bloody stool, hemorrhoids also flared up.  His pain came back.  Had some mild fever. He was having mucousy stool and also decreased oral intake.  After visiting his PCP he was sent to the ER for further evaluation and treatment.  Repeat evaluation in the ER showed proctocolitis.  Patient has been admitted to the hospital for further treatment.    Consultants: None yet  Procedures: None    Subjective/Interval History: Patient having small quantity diarrhea.  Some blood mixed with mucus is noted.  Abdominal discomfort is present though he denies any nausea vomiting.    Assessment/Plan:  Acute proctocolitis Recently hospitalized with diarrheal illness stool study was positive for enteroinvasive E. coli/Shigella.  Patient was initially treated with IV antibiotics and then changed over to ciprofloxacin .  He was initially doing well then had recurrence of his symptoms. Patient noted to be on ceftriaxone  metronidazole . C. difficile was negative on 12/4.  Will be reordered. Patient is afebrile.  Abdomen is benign on examination.  WBC is normal.  Mild hematochezia Most likely related to acute infection.  No significant rectal bleeding noted.  Hemoglobin is stable.  Coronary artery disease Stable.  Asthma Continue within his  inhalers.  Obesity Estimated body mass index is 35.43 kg/m as calculated from the following:   Height as of this encounter: 5' 11 (1.803 m).   Weight as of this encounter: 115.2 kg.  DVT Prophylaxis: Lovenox  Code Status: Full code Family Communication: Discussed with patient Disposition Plan: Hopefully return home when improved     Medications: Scheduled:  [START ON 10/19/2024] anastrozole  0.5 mg Oral Weekly   aspirin  EC  81 mg Oral Daily   budesonide -glycopyrrolate -formoterol   2 puff Inhalation BID   cyanocobalamin   1,000 mcg Oral Daily   emtricitabine -tenofovir  AF  1 tablet Oral Daily   enoxaparin  (LOVENOX ) injection  40 mg Subcutaneous Q24H   ferrous sulfate   325 mg Oral BID WC   guaiFENesin   600 mg Oral BID   ipratropium  2 spray Nasal BID   irbesartan  75 mg Oral Daily   loratadine   10 mg Oral Daily   metoprolol  succinate  50 mg Oral QHS   montelukast   10 mg Oral QHS   olopatadine   1 drop Both Eyes BID   rosuvastatin   10 mg Oral Daily   tadalafil   5 mg Oral Q1200   tamsulosin   0.4 mg Oral QPC supper   Vonoprazan Fumarate   1 tablet Oral Daily   zolpidem   10 mg Oral QHS   Continuous:  cefTRIAXone  (ROCEPHIN )  IV 2 g (10/15/24 0032)   dextrose  5% lactated ringers  100 mL/hr at 10/15/24 0358   metronidazole  500 mg (10/15/24 0215)   promethazine  (PHENERGAN ) injection (IM or IVPB)     PRN:albuterol , ALPRAZolam , dicyclomine , famotidine, mirabegron ER, morphine  injection, ondansetron , pantoprazole   Antibiotics: Anti-infectives (From admission, onward)    Start     Dose/Rate  Route Frequency Ordered Stop   10/15/24 1000  emtricitabine -tenofovir  AF (DESCOVY ) 200-25 MG per tablet 1 tablet        1 tablet Oral Daily 10/14/24 2345     10/15/24 0045  cefTRIAXone  (ROCEPHIN ) 2 g in sodium chloride  0.9 % 100 mL IVPB        2 g 200 mL/hr over 30 Minutes Intravenous Every 24 hours 10/14/24 2345     10/15/24 0045  metroNIDAZOLE  (FLAGYL ) IVPB 500 mg        500 mg 100 mL/hr over  60 Minutes Intravenous Every 12 hours 10/14/24 2345         Objective:  Vital Signs  Vitals:   10/15/24 0024 10/15/24 0243 10/15/24 0652 10/15/24 1033  BP: (!) 144/89 126/79 139/84 133/85  Pulse: (!) 58 82 80 75  Resp:  18 18   Temp:  98.8 F (37.1 C) 98.4 F (36.9 C) 98.1 F (36.7 C)  TempSrc:   Oral Oral  SpO2:  91% 95% 94%  Weight:      Height:        Intake/Output Summary (Last 24 hours) at 10/15/2024 1220 Last data filed at 10/15/2024 1113 Gross per 24 hour  Intake 1240 ml  Output 700 ml  Net 540 ml   Filed Weights   10/14/24 1434  Weight: 115.2 kg    General appearance: Awake alert.  In no distress Resp: Clear to auscultation bilaterally.  Normal effort Cardio: S1-S2 is normal regular.  No S3-S4.  No rubs murmurs or bruit GI: Abdomen is soft.  Diffusely tender without any rebound rigidity or guarding.  No masses organomegaly.  Bowel sounds present. Extremities: No edema.  Full range of motion of lower extremities. Neurologic: Alert and oriented x3.  No focal neurological deficits.    Lab Results:  Data Reviewed: I have personally reviewed following labs and reports of the imaging studies  CBC: Recent Labs  Lab 10/09/24 0525 10/10/24 0551 10/14/24 1441 10/15/24 0059  WBC 8.8 6.1 6.8 7.1  HGB 14.9 14.3 17.4* 15.8  HCT 46.1 44.3 53.3* 47.3  MCV 84.7 84.9 83.7 81.7  PLT 151 143* 262 237    Basic Metabolic Panel: Recent Labs  Lab 10/09/24 0525 10/10/24 0551 10/14/24 1441 10/15/24 0059  NA 136 140 140 137  K 3.4* 3.5 4.0 3.7  CL 102 106 104 105  CO2 25 25 26 22   GLUCOSE 93 98 82 99  BUN 6 <5* 6 6  CREATININE 1.29* 1.00 1.24 1.14  CALCIUM  8.7* 8.7* 9.6 8.6*  MG 2.3 2.1  --   --   PHOS 2.2* 3.0  --   --     GFR: Estimated Creatinine Clearance: 94.5 mL/min (by C-G formula based on SCr of 1.14 mg/dL).  Liver Function Tests: Recent Labs  Lab 10/14/24 1441 10/15/24 0059  AST 24 19  ALT 26 22  ALKPHOS 84 76  BILITOT 0.6 0.5  PROT  7.4 6.1*  ALBUMIN 4.3 3.6    Recent Labs  Lab 10/14/24 1441  LIPASE 132*   BNP (last 3 results) Recent Labs    03/18/24 0913  PROBNP <36     Recent Results (from the past 240 hours)  Gastrointestinal Panel by PCR , Stool     Status: Abnormal   Collection Time: 10/08/24 12:46 PM   Specimen: Stool  Result Value Ref Range Status   Campylobacter species NOT DETECTED NOT DETECTED Final   Plesimonas shigelloides NOT DETECTED NOT DETECTED Final  Salmonella species NOT DETECTED NOT DETECTED Final   Yersinia enterocolitica NOT DETECTED NOT DETECTED Final   Vibrio species NOT DETECTED NOT DETECTED Final   Vibrio cholerae NOT DETECTED NOT DETECTED Final   Enteroaggregative E coli (EAEC) DETECTED (A) NOT DETECTED Final    Comment: RESULT CALLED TO, READ BACK BY AND VERIFIED WITH: DELON DEL., RN AT 1649 10/09/24 RAM    Enteropathogenic E coli (EPEC) NOT DETECTED NOT DETECTED Final   Enterotoxigenic E coli (ETEC) NOT DETECTED NOT DETECTED Final   Shiga like toxin producing E coli (STEC) NOT DETECTED NOT DETECTED Final   Shigella/Enteroinvasive E coli (EIEC) DETECTED (A) NOT DETECTED Final    Comment: RESULT CALLED TO, READ BACK BY AND VERIFIED WITH: JENNIFER H., RN AT 1649 10/09/24 RAM    Cryptosporidium NOT DETECTED NOT DETECTED Final   Cyclospora cayetanensis NOT DETECTED NOT DETECTED Final   Entamoeba histolytica NOT DETECTED NOT DETECTED Final   Giardia lamblia NOT DETECTED NOT DETECTED Final   Adenovirus F40/41 NOT DETECTED NOT DETECTED Final   Astrovirus NOT DETECTED NOT DETECTED Final   Norovirus GI/GII NOT DETECTED NOT DETECTED Final   Rotavirus A NOT DETECTED NOT DETECTED Final   Sapovirus (I, II, IV, and V) NOT DETECTED NOT DETECTED Final    Comment: Performed at [facility], [address] [place], [place] [address]  C Difficile Quick Screen w PCR reflex     Status: None   Collection Time: 10/08/24 12:46 PM   Specimen: STOOL  Result Value Ref Range Status    C Diff antigen NEGATIVE NEGATIVE Final   C Diff toxin NEGATIVE NEGATIVE Final   C Diff interpretation No C. difficile detected.  Final    Comment: Performed at [facility], [address], [place], [place] [address]      Radiology Studies: CT ABDOMEN PELVIS W CONTRAST Result Date: 10/14/2024 EXAM: CT ABDOMEN AND PELVIS WITH CONTRAST 10/14/2024 07:58:50 PM TECHNIQUE: CT of the abdomen and pelvis was performed with the administration of 100 mL of iohexol  (OMNIPAQUE ) 300 MG/ML solution. Multiplanar reformatted images are provided for review. Automated exposure control, iterative reconstruction, and/or weight-based adjustment of the mA/kV was utilized to reduce the radiation dose to as low as reasonably achievable. COMPARISON: CT with iv contrast 10/07/2024, CT with iv contrast 05/19/2024, and CT abdomen and pelvis 05/26/2021. CLINICAL HISTORY: Diarrhea. Patient was recently admitted with colitis and is also having difficulty urinating. FINDINGS: LOWER CHEST: There is an elevated right hemidiaphragm. Lung bases are clear of infiltrates. The cardiac size is normal. There is scattered calcification in the proximal LAD coronary artery. LIVER: The liver is mildly steatotic. There is a 2.9 cm septated cyst in segment 2, stable back to CT abdomen and pelvis from 05/26/2021. Consistent with a benign cyst. There is no mass enhancement. GALLBLADDER AND BILE DUCTS: Gallbladder is unremarkable. No biliary ductal dilatation. SPLEEN: The spleen is mildly prominent, measuring 14 cm in length. No mass. PANCREAS: No acute abnormality. ADRENAL GLANDS: No acute abnormality. There is no adrenal mass. KIDNEYS, URETERS AND BLADDER: No stones in the kidneys or ureters. No hydronephrosis. No perinephric or periureteral stranding. The bladder is unremarkable for the degree of distention. There is a mild impression into the bladder base by the enlarged prostate. There is no renal mass. GI AND BOWEL: Stomach  demonstrates no acute abnormality. The gastric wall and unopacified small bowel are unremarkable, as visualized. The appendix is more prominent than on the last CT, measuring 10 mm, but  is similar to 05/19/2024. There are no inflammatory changes. Clinical correlation recommended for potential early appendicitis. There is no longer any wall thickening in the ascending and transverse colon. The rectosigmoid surgical anastomosis is intact. Mild increased stranding is seen along the distal descending and rectosigmoid colon with either mild wall prominence or changes of underdistention. The findings are concerning for proctocolitis given the increased adjacent stranding. There is no bowel obstruction. PERITONEUM AND RETROPERITONEUM: No ascites. No free air. There are small umbilical and inguinal fat hernias but no incarcerated hernia. VASCULATURE: Aorta is normal in caliber. There is mild aortic atherosclerosis with no aneurysm or dissection. LYMPH NODES: No lymphadenopathy. REPRODUCTIVE ORGANS: The prostate is enlarged, measuring 5.5 cm transverse. There is a mild impression into the bladder base by the enlarged prostate. BONES AND SOFT TISSUES: There are degenerative changes of the thoracic and lumbar spine with no acute or significant osseous findings. No focal soft tissue abnormality. IMPRESSION: 1. Mild increased stranding along the distal descending and rectosigmoid colon, concerning for proctocolitis. 2. Prominent appendix measuring 10 mm, similar to 05/19/2024 but more prominent than 1 week ago , without inflammatory changes. Suggest clinical correlation for early appendicitis. 3. Enlarged prostate measuring 5.5 cm transverse with mild impression into the bladder base. 4. Aortic and coronary artery atherosclerosis. Electronically signed by: Francis Quam MD 10/14/2024 08:22 PM EST RP Workstation: [id number]       LOS: 1 day   Joette Pebbles  [facility] Pager on [email or web address]  10/15/2024, 12:20  PM

## 2024-10-16 ENCOUNTER — Other Ambulatory Visit (HOSPITAL_COMMUNITY): Payer: Self-pay

## 2024-10-16 LAB — CBC
HCT: 49.1 % (ref 39.0–52.0)
Hemoglobin: 16 g/dL (ref 13.0–17.0)
MCH: 27.4 pg (ref 26.0–34.0)
MCHC: 32.6 g/dL (ref 30.0–36.0)
MCV: 84.1 fL (ref 80.0–100.0)
Platelets: 233 K/uL (ref 150–400)
RBC: 5.84 MIL/uL — ABNORMAL HIGH (ref 4.22–5.81)
RDW: 17.4 % — ABNORMAL HIGH (ref 11.5–15.5)
WBC: 6.8 K/uL (ref 4.0–10.5)
nRBC: 0 % (ref 0.0–0.2)

## 2024-10-16 LAB — BASIC METABOLIC PANEL WITH GFR
Anion gap: 11 (ref 5–15)
BUN: 7 mg/dL (ref 6–20)
CO2: 25 mmol/L (ref 22–32)
Calcium: 9.3 mg/dL (ref 8.9–10.3)
Chloride: 103 mmol/L (ref 98–111)
Creatinine, Ser: 1.18 mg/dL (ref 0.61–1.24)
GFR, Estimated: >60 mL/min (ref >60)
Glucose, Bld: 85 mg/dL (ref 70–99)
Potassium: 4.1 mmol/L (ref 3.5–5.1)
Sodium: 140 mmol/L (ref 135–145)

## 2024-10-16 LAB — C DIFFICILE QUICK SCREEN W PCR REFLEX
C Diff antigen: NEGATIVE
C Diff interpretation: NOT DETECTED
C Diff toxin: NEGATIVE

## 2024-10-16 LAB — MAGNESIUM: Magnesium: 2.5 mg/dL — ABNORMAL HIGH (ref 1.7–2.4)

## 2024-10-16 MED ORDER — PROCHLORPERAZINE EDISYLATE 10 MG/2ML IJ SOLN
10.0000 mg | Freq: Once | INTRAMUSCULAR | Status: AC
  Administered 2024-10-16: 10 mg via INTRAVENOUS
  Filled 2024-10-16: qty 2

## 2024-10-16 MED ORDER — HYDROCORTISONE (PERIANAL) 2.5 % EX CREA
TOPICAL_CREAM | Freq: Two times a day (BID) | CUTANEOUS | Status: DC
  Filled 2024-10-16: qty 28.35

## 2024-10-16 MED ORDER — SUMATRIPTAN SUCCINATE 25 MG PO TABS
25.0000 mg | ORAL_TABLET | Freq: Once | ORAL | Status: AC
  Administered 2024-10-16: 25 mg via ORAL
  Filled 2024-10-16: qty 1

## 2024-10-16 MED ORDER — KETOROLAC TROMETHAMINE 30 MG/ML IJ SOLN
30.0000 mg | Freq: Once | INTRAMUSCULAR | Status: AC
  Administered 2024-10-16: 30 mg via INTRAVENOUS
  Filled 2024-10-16: qty 1

## 2024-10-16 MED ADMIN — Zolpidem Tartrate Tab 5 MG: 10 mg | ORAL | NDC 00904608261

## 2024-10-16 MED ADMIN — Saccharomyces boulardii Cap 250 MG: 250 mg | ORAL | NDC 0414200007

## 2024-10-16 MED FILL — Saccharomyces boulardii Cap 250 MG: 250.0000 mg | ORAL | Qty: 1 | Status: AC

## 2024-10-16 NOTE — Plan of Care (Signed)

## 2024-10-16 NOTE — Progress Notes (Signed)
 [facility] PROGRESS NOTE   Randall Reed. FMW:[id number] DOB: Apr 11, 1971 DOA: 10/14/2024  PCP: Levora Reyes SAUNDERS, MD  Brief History: 53 y.o. male with medical history significant of for the history of anxiety disorder, essential hypertension, hyperlipidemia, previous colectomy due to diverticulitis, who was recently admitted with abdominal pain nausea vomiting secondary to Shigella and E. coli infection by PCR of the stool.  Patient was treated with IV antibiotics and discharged on oral ciprofloxacin .  Initially patient felt better but went home then couple of days later symptoms returned in full force.  Patient has been having bloody stool, hemorrhoids also flared up.  His pain came back.  Had some mild fever. He was having mucousy stool and also decreased oral intake.  After visiting his PCP he was sent to the ER for further evaluation and treatment.  Repeat evaluation in the ER showed proctocolitis.  Patient has been admitted to the hospital for further treatment.    Consultants: None yet  Procedures: None   Subjective/Interval History: Patient had only 2 bowel movements in the last 24 hours.  Feels somewhat better compared to yesterday though still has occasional nausea.  Denies any abdominal pain per se this morning.    Assessment/Plan:  Acute proctocolitis Recently hospitalized with diarrheal illness stool study was positive for enteroinvasive E. coli/Shigella.  Patient was initially treated with IV antibiotics and then changed over to ciprofloxacin .  He was initially doing well then had recurrence of his symptoms.  Patient was started back on IV antibiotics with ceftriaxone  and metronidazole .  C. difficile testing was reordered and again noted to be negative. Patient's diarrhea appears to be subsiding. Abdomen remains benign on examination.  WBC noted to be normal. Unclear why patient relapsed after being changed over to oral antibiotics.  He was previously changed over  to Cipro .  Alternative antibiotics will be considered.  Mild hematochezia Mainly on tissue paper.  He does have a history of hemorrhoids.  Anusol  HC will be ordered.   No significant bleeding noted.  Hemoglobin is stable.  Coronary artery disease Stable.  Asthma Continue within his inhalers.  Obesity Estimated body mass index is 35.43 kg/m as calculated from the following:   Height as of this encounter: 5' 11 (1.803 m).   Weight as of this encounter: 115.2 kg.  DVT Prophylaxis: Lovenox  Code Status: Full code Family Communication: Discussed with patient Disposition Plan: Hopefully return home when improved     Medications: Scheduled:  [START ON 10/19/2024] anastrozole  0.5 mg Oral Weekly   aspirin  EC  81 mg Oral Daily   budesonide -glycopyrrolate -formoterol   2 puff Inhalation BID   cyanocobalamin   1,000 mcg Oral Daily   emtricitabine -tenofovir  AF  1 tablet Oral Daily   enoxaparin  (LOVENOX ) injection  40 mg Subcutaneous Q24H   ferrous sulfate   325 mg Oral BID WC   guaiFENesin   600 mg Oral BID   hydrocortisone    Rectal BID   ipratropium  2 spray Nasal BID   irbesartan  75 mg Oral Daily   loratadine   10 mg Oral Daily   metoprolol  succinate  50 mg Oral QHS   montelukast   10 mg Oral QHS   olopatadine   1 drop Both Eyes BID   rosuvastatin   10 mg Oral Daily   saccharomyces boulardii  250 mg Oral BID   tadalafil   5 mg Oral Q1200   tamsulosin   0.4 mg Oral QPC supper   Vonoprazan Fumarate   1 tablet Oral Daily   zolpidem   10 mg Oral QHS   Continuous:  cefTRIAXone  (ROCEPHIN )  IV 2 g (10/16/24 0032)   metronidazole  500 mg (10/16/24 0152)   PRN:albuterol , ALPRAZolam , dicyclomine , famotidine, mirabegron ER, morphine  injection, ondansetron , pantoprazole   Antibiotics: Anti-infectives (From admission, onward)    Start     Dose/Rate Route Frequency Ordered Stop   10/15/24 1000  emtricitabine -tenofovir  AF (DESCOVY ) 200-25 MG per tablet 1 tablet        1 tablet Oral Daily  10/14/24 2345     10/15/24 0045  cefTRIAXone  (ROCEPHIN ) 2 g in sodium chloride  0.9 % 100 mL IVPB        2 g 200 mL/hr over 30 Minutes Intravenous Every 24 hours 10/14/24 2345     10/15/24 0045  metroNIDAZOLE  (FLAGYL ) IVPB 500 mg        500 mg 100 mL/hr over 60 Minutes Intravenous Every 12 hours 10/14/24 2345         Objective:  Vital Signs  Vitals:   10/15/24 2118 10/15/24 2147 10/16/24 0545 10/16/24 0736  BP: 121/84 121/84 129/89 139/85  Pulse: 77 77 68 72  Resp: 20  18 16   Temp: 98.3 F (36.8 C)  97.9 F (36.6 C) 98.4 F (36.9 C)  TempSrc:      SpO2: 94%  96% 95%  Weight:      Height:        Intake/Output Summary (Last 24 hours) at 10/16/2024 1127 Last data filed at 10/15/2024 1827 Gross per 24 hour  Intake 1165.55 ml  Output 1400 ml  Net -234.45 ml   Filed Weights   10/14/24 1434  Weight: 115.2 kg    General appearance: Awake alert.  In no distress Resp: Clear to auscultation bilaterally.  Normal effort Cardio: S1-S2 is normal regular.  No S3-S4.  No rubs murmurs or bruit GI: Abdomen is soft.  Nontender nondistended.  Bowel sounds are present normal.  No masses organomegaly Extremities: No edema.  Full range of motion of lower extremities. Neurologic: Alert and oriented x3.  No focal neurological deficits.    Lab Results:  Data Reviewed: I have personally reviewed following labs and reports of the imaging studies  CBC: Recent Labs  Lab 10/10/24 0551 10/14/24 1441 10/15/24 0059 10/16/24 0535  WBC 6.1 6.8 7.1 6.8  HGB 14.3 17.4* 15.8 16.0  HCT 44.3 53.3* 47.3 49.1  MCV 84.9 83.7 81.7 84.1  PLT 143* 262 237 233    Basic Metabolic Panel: Recent Labs  Lab 10/10/24 0551 10/14/24 1441 10/15/24 0059 10/16/24 0535  NA 140 140 137 140  K 3.5 4.0 3.7 4.1  CL 106 104 105 103  CO2 25 26 22 25   GLUCOSE 98 82 99 85  BUN <5* 6 6 7   CREATININE 1.00 1.24 1.14 1.18  CALCIUM  8.7* 9.6 8.6* 9.3  MG 2.1  --   --  2.5*  PHOS 3.0  --   --   --      GFR: Estimated Creatinine Clearance: 91.3 mL/min (by C-G formula based on SCr of 1.18 mg/dL).  Liver Function Tests: Recent Labs  Lab 10/14/24 1441 10/15/24 0059  AST 24 19  ALT 26 22  ALKPHOS 84 76  BILITOT 0.6 0.5  PROT 7.4 6.1*  ALBUMIN 4.3 3.6    Recent Labs  Lab 10/14/24 1441  LIPASE 132*   BNP (last 3 results) Recent Labs    03/18/24 0913  PROBNP <36     Recent Results (from the past 240 hours)  Gastrointestinal Panel by  PCR , Stool     Status: Abnormal   Collection Time: 10/08/24 12:46 PM   Specimen: Stool  Result Value Ref Range Status   Campylobacter species NOT DETECTED NOT DETECTED Final   Plesimonas shigelloides NOT DETECTED NOT DETECTED Final   Salmonella species NOT DETECTED NOT DETECTED Final   Yersinia enterocolitica NOT DETECTED NOT DETECTED Final   Vibrio species NOT DETECTED NOT DETECTED Final   Vibrio cholerae NOT DETECTED NOT DETECTED Final   Enteroaggregative E coli (EAEC) DETECTED (A) NOT DETECTED Final    Comment: RESULT CALLED TO, READ BACK BY AND VERIFIED WITH: JENNIFER H., RN AT 1649 10/09/24 RAM    Enteropathogenic E coli (EPEC) NOT DETECTED NOT DETECTED Final   Enterotoxigenic E coli (ETEC) NOT DETECTED NOT DETECTED Final   Shiga like toxin producing E coli (STEC) NOT DETECTED NOT DETECTED Final   Shigella/Enteroinvasive E coli (EIEC) DETECTED (A) NOT DETECTED Final    Comment: RESULT CALLED TO, READ BACK BY AND VERIFIED WITH: JENNIFER H., RN AT 1649 10/09/24 RAM    Cryptosporidium NOT DETECTED NOT DETECTED Final   Cyclospora cayetanensis NOT DETECTED NOT DETECTED Final   Entamoeba histolytica NOT DETECTED NOT DETECTED Final   Giardia lamblia NOT DETECTED NOT DETECTED Final   Adenovirus F40/41 NOT DETECTED NOT DETECTED Final   Astrovirus NOT DETECTED NOT DETECTED Final   Norovirus GI/GII NOT DETECTED NOT DETECTED Final   Rotavirus A NOT DETECTED NOT DETECTED Final   Sapovirus (I, II, IV, and V) NOT DETECTED NOT DETECTED Final     Comment: Performed at [facility], [address] [place], [place] [address]  C Difficile Quick Screen w PCR reflex     Status: None   Collection Time: 10/08/24 12:46 PM   Specimen: STOOL  Result Value Ref Range Status   C Diff antigen NEGATIVE NEGATIVE Final   C Diff toxin NEGATIVE NEGATIVE Final   C Diff interpretation No C. difficile detected.  Final    Comment: Performed at [facility], [address], [place], [place] [address]  C Difficile Quick Screen w PCR reflex     Status: None   Collection Time: 10/16/24 12:36 AM   Specimen: STOOL  Result Value Ref Range Status   C Diff antigen NEGATIVE NEGATIVE Final   C Diff toxin NEGATIVE NEGATIVE Final   C Diff interpretation No C. difficile detected.  Final    Comment: Performed at [facility], [address], [place], [place] [address]      Radiology Studies: CT ABDOMEN PELVIS W CONTRAST Result Date: 10/14/2024 EXAM: CT ABDOMEN AND PELVIS WITH CONTRAST 10/14/2024 07:58:50 PM TECHNIQUE: CT of the abdomen and pelvis was performed with the administration of 100 mL of iohexol  (OMNIPAQUE ) 300 MG/ML solution. Multiplanar reformatted images are provided for review. Automated exposure control, iterative reconstruction, and/or weight-based adjustment of the mA/kV was utilized to reduce the radiation dose to as low as reasonably achievable. COMPARISON: CT with iv contrast 10/07/2024, CT with iv contrast 05/19/2024, and CT abdomen and pelvis 05/26/2021. CLINICAL HISTORY: Diarrhea. Patient was recently admitted with colitis and is also having difficulty urinating. FINDINGS: LOWER CHEST: There is an elevated right hemidiaphragm. Lung bases are clear of infiltrates. The cardiac size is normal. There is scattered calcification in the proximal LAD coronary artery. LIVER: The liver is mildly steatotic. There is a 2.9 cm septated cyst in segment 2, stable back to CT abdomen and pelvis from 05/26/2021.  Consistent with a benign cyst.  There is no mass enhancement. GALLBLADDER AND BILE DUCTS: Gallbladder is unremarkable. No biliary ductal dilatation. SPLEEN: The spleen is mildly prominent, measuring 14 cm in length. No mass. PANCREAS: No acute abnormality. ADRENAL GLANDS: No acute abnormality. There is no adrenal mass. KIDNEYS, URETERS AND BLADDER: No stones in the kidneys or ureters. No hydronephrosis. No perinephric or periureteral stranding. The bladder is unremarkable for the degree of distention. There is a mild impression into the bladder base by the enlarged prostate. There is no renal mass. GI AND BOWEL: Stomach demonstrates no acute abnormality. The gastric wall and unopacified small bowel are unremarkable, as visualized. The appendix is more prominent than on the last CT, measuring 10 mm, but is similar to 05/19/2024. There are no inflammatory changes. Clinical correlation recommended for potential early appendicitis. There is no longer any wall thickening in the ascending and transverse colon. The rectosigmoid surgical anastomosis is intact. Mild increased stranding is seen along the distal descending and rectosigmoid colon with either mild wall prominence or changes of underdistention. The findings are concerning for proctocolitis given the increased adjacent stranding. There is no bowel obstruction. PERITONEUM AND RETROPERITONEUM: No ascites. No free air. There are small umbilical and inguinal fat hernias but no incarcerated hernia. VASCULATURE: Aorta is normal in caliber. There is mild aortic atherosclerosis with no aneurysm or dissection. LYMPH NODES: No lymphadenopathy. REPRODUCTIVE ORGANS: The prostate is enlarged, measuring 5.5 cm transverse. There is a mild impression into the bladder base by the enlarged prostate. BONES AND SOFT TISSUES: There are degenerative changes of the thoracic and lumbar spine with no acute or significant osseous findings. No focal soft tissue abnormality. IMPRESSION: 1.  Mild increased stranding along the distal descending and rectosigmoid colon, concerning for proctocolitis. 2. Prominent appendix measuring 10 mm, similar to 05/19/2024 but more prominent than 1 week ago , without inflammatory changes. Suggest clinical correlation for early appendicitis. 3. Enlarged prostate measuring 5.5 cm transverse with mild impression into the bladder base. 4. Aortic and coronary artery atherosclerosis. Electronically signed by: Francis Quam MD 10/14/2024 08:22 PM EST RP Workstation: [id number]       LOS: 2 days   Joette Pebbles  [facility] Pager on [email or web address]  10/16/2024, 11:27 AM

## 2024-10-16 NOTE — Plan of Care (Signed)

## 2024-10-17 ENCOUNTER — Other Ambulatory Visit (HOSPITAL_COMMUNITY): Payer: Self-pay

## 2024-10-17 DIAGNOSIS — K529 Noninfective gastroenteritis and colitis, unspecified: Secondary | ICD-10-CM | POA: Diagnosis not present

## 2024-10-17 MED ORDER — ACETAMINOPHEN 500 MG PO TABS
1000.0000 mg | ORAL_TABLET | Freq: Once | ORAL | Status: AC
  Administered 2024-10-17: 1000 mg via ORAL
  Filled 2024-10-17: qty 2

## 2024-10-17 MED ORDER — SACCHAROMYCES BOULARDII 250 MG PO CAPS
250.0000 mg | ORAL_CAPSULE | Freq: Two times a day (BID) | ORAL | 0 refills | Status: AC
  Filled 2024-10-17: qty 60, 30d supply, fill #0

## 2024-10-17 MED ORDER — HYDROCORTISONE (PERIANAL) 2.5 % EX CREA
TOPICAL_CREAM | Freq: Two times a day (BID) | CUTANEOUS | 0 refills | Status: AC
  Filled 2024-10-17: qty 30, 30d supply, fill #0

## 2024-10-17 MED ADMIN — Saccharomyces boulardii Cap 250 MG: 250 mg | ORAL | NDC 00904723006

## 2024-10-17 NOTE — Progress Notes (Signed)
 Discharge med in a secure bag delivered to patient  by this RN.Patient confirmed he would buy florastor OTC

## 2024-10-17 NOTE — Discharge Summary (Signed)
 [facility]  Physician Discharge Summary   Patient ID: Randall Reed. MRN: [id number] DOB/AGE: 1971/10/21 53 y.o.  Admit date: 10/14/2024 Discharge date: 10/17/2024    PCP: Randall Reyes SAUNDERS, MD  DISCHARGE DIAGNOSES:  Acute diarrhea Recent E. coli gastroenteritis   Essential hypertension   GERD (gastroesophageal reflux disease)   GAD (generalized anxiety disorder)   Coronary artery disease involving native coronary artery of native heart without angina pectoris   Gastro-esophageal reflux disease without esophagitis   Hypopituitarism   RECOMMENDATIONS FOR OUTPATIENT FOLLOW UP: Patient to follow-up with PCP   Home Health:  none Equipment/Devices: None  CODE STATUS: Full code  DISCHARGE CONDITION: fair  Diet recommendation: Soft bland diet for a few days  INITIAL HISTORY: 53 y.o. male with medical history significant of for the history of anxiety disorder, essential hypertension, hyperlipidemia, previous colectomy due to diverticulitis, who was recently admitted with abdominal pain nausea vomiting secondary to Shigella and E. coli infection by PCR of the stool.  Patient was treated with IV antibiotics and discharged on oral ciprofloxacin .  Initially patient felt better but went home then couple of days later symptoms returned in full force.  Patient has been having bloody stool, hemorrhoids also flared up.  His pain came back.  Had some mild fever. He was having mucousy stool and also decreased oral intake.  After visiting his PCP he was sent to the ER for further evaluation and treatment.  Repeat evaluation in the ER showed proctocolitis.  Patient has been admitted to the hospital for further treatment.    HOSPITAL COURSE:   Acute proctocolitis Recently hospitalized with diarrheal illness stool study was positive for enteroinvasive E. coli/Shigella.  Patient was initially treated with IV antibiotics and then changed over to ciprofloxacin .  He was initially doing  well then had recurrence of his symptoms.  Patient was started back on IV antibiotics with ceftriaxone  and metronidazole .  C. difficile testing was reordered and again noted to be negative. Patient's diarrhea appears to be subsiding. Abdomen remains benign on examination.  WBC noted to be normal. Unclear why patient relapsed after being changed over to oral antibiotics.  He has completed more than 7 days of treatment and due to resolution of diarrhea does not need antibiotics going forward.  Nausea has also improved.   Mild hematochezia Mainly on tissue paper.  He does have a history of hemorrhoids.  Anusol  HC.   No significant bleeding noted.  Hemoglobin is stable.   Coronary artery disease Stable.   Asthma Continue within his inhalers.   Obesity Estimated body mass index is 35.43 kg/m as calculated from the following:   Height as of this encounter: 5' 11 (1.803 m).   Weight as of this encounter: 115.2 kg.   Patient feels better this morning.  He was told that his bowel habits will likely take several weeks to get back to normal.  Okay for discharge home today.   PERTINENT LABS:  The results of significant diagnostics from this hospitalization (including imaging, microbiology, ancillary and laboratory) are listed below for reference.    Microbiology: Recent Results (from the past 240 hours)  Gastrointestinal Panel by PCR , Stool     Status: Abnormal   Collection Time: 10/08/24 12:46 PM   Specimen: Stool  Result Value Ref Range Status   Campylobacter species NOT DETECTED NOT DETECTED Final   Plesimonas shigelloides NOT DETECTED NOT DETECTED Final   Salmonella species NOT DETECTED NOT DETECTED Final   Yersinia enterocolitica NOT  DETECTED NOT DETECTED Final   Vibrio species NOT DETECTED NOT DETECTED Final   Vibrio cholerae NOT DETECTED NOT DETECTED Final   Enteroaggregative E coli (EAEC) DETECTED (A) NOT DETECTED Final    Comment: RESULT CALLED TO, READ BACK BY AND VERIFIED  WITH: DELON DEL., RN AT 1649 10/09/24 RAM    Enteropathogenic E coli (EPEC) NOT DETECTED NOT DETECTED Final   Enterotoxigenic E coli (ETEC) NOT DETECTED NOT DETECTED Final   Shiga like toxin producing E coli (STEC) NOT DETECTED NOT DETECTED Final   Shigella/Enteroinvasive E coli (EIEC) DETECTED (A) NOT DETECTED Final    Comment: RESULT CALLED TO, READ BACK BY AND VERIFIED WITH: JENNIFER H., RN AT 1649 10/09/24 RAM    Cryptosporidium NOT DETECTED NOT DETECTED Final   Cyclospora cayetanensis NOT DETECTED NOT DETECTED Final   Entamoeba histolytica NOT DETECTED NOT DETECTED Final   Giardia lamblia NOT DETECTED NOT DETECTED Final   Adenovirus F40/41 NOT DETECTED NOT DETECTED Final   Astrovirus NOT DETECTED NOT DETECTED Final   Norovirus GI/GII NOT DETECTED NOT DETECTED Final   Rotavirus A NOT DETECTED NOT DETECTED Final   Sapovirus (I, II, IV, and V) NOT DETECTED NOT DETECTED Final    Comment: Performed at [facility], [address] [place], [place] [address]  C Difficile Quick Screen w PCR reflex     Status: None   Collection Time: 10/08/24 12:46 PM   Specimen: STOOL  Result Value Ref Range Status   C Diff antigen NEGATIVE NEGATIVE Final   C Diff toxin NEGATIVE NEGATIVE Final   C Diff interpretation No C. difficile detected.  Final    Comment: Performed at [facility], [address] [place], [place] [address]  C Difficile Quick Screen w PCR reflex     Status: None   Collection Time: 10/16/24 12:36 AM   Specimen: STOOL  Result Value Ref Range Status   C Diff antigen NEGATIVE NEGATIVE Final   C Diff toxin NEGATIVE NEGATIVE Final   C Diff interpretation No C. difficile detected.  Final    Comment: Performed at [facility], [address], [place], [place] [address]     Labs:   Basic Metabolic Panel: Recent Labs  Lab 10/14/24 1441 10/15/24 0059 10/16/24 0535  NA 140 137 140  K 4.0 3.7 4.1  CL 104 105 103  CO2 26 22 25    GLUCOSE 82 99 85  BUN 6 6 7   CREATININE 1.24 1.14 1.18  CALCIUM  9.6 8.6* 9.3  MG  --   --  2.5*   Liver Function Tests: Recent Labs  Lab 10/14/24 1441 10/15/24 0059  AST 24 19  ALT 26 22  ALKPHOS 84 76  BILITOT 0.6 0.5  PROT 7.4 6.1*  ALBUMIN 4.3 3.6   Recent Labs  Lab 10/14/24 1441  LIPASE 132*    CBC: Recent Labs  Lab 10/14/24 1441 10/15/24 0059 10/16/24 0535  WBC 6.8 7.1 6.8  HGB 17.4* 15.8 16.0  HCT 53.3* 47.3 49.1  MCV 83.7 81.7 84.1  PLT 262 237 233     ProBNP (last 3 results) Recent Labs    03/18/24 0913  PROBNP <36     IMAGING STUDIES CT ABDOMEN PELVIS W CONTRAST Result Date: 10/14/2024 EXAM: CT ABDOMEN AND PELVIS WITH CONTRAST 10/14/2024 07:58:50 PM TECHNIQUE: CT of the abdomen and pelvis was performed with the administration of 100 mL of iohexol  (OMNIPAQUE ) 300 MG/ML solution. Multiplanar reformatted images are provided for review. Automated  exposure control, iterative reconstruction, and/or weight-based adjustment of the mA/kV was utilized to reduce the radiation dose to as low as reasonably achievable. COMPARISON: CT with iv contrast 10/07/2024, CT with iv contrast 05/19/2024, and CT abdomen and pelvis 05/26/2021. CLINICAL HISTORY: Diarrhea. Patient was recently admitted with colitis and is also having difficulty urinating. FINDINGS: LOWER CHEST: There is an elevated right hemidiaphragm. Lung bases are clear of infiltrates. The cardiac size is normal. There is scattered calcification in the proximal LAD coronary artery. LIVER: The liver is mildly steatotic. There is a 2.9 cm septated cyst in segment 2, stable back to CT abdomen and pelvis from 05/26/2021. Consistent with a benign cyst. There is no mass enhancement. GALLBLADDER AND BILE DUCTS: Gallbladder is unremarkable. No biliary ductal dilatation. SPLEEN: The spleen is mildly prominent, measuring 14 cm in length. No mass. PANCREAS: No acute abnormality. ADRENAL GLANDS: No acute abnormality. There is  no adrenal mass. KIDNEYS, URETERS AND BLADDER: No stones in the kidneys or ureters. No hydronephrosis. No perinephric or periureteral stranding. The bladder is unremarkable for the degree of distention. There is a mild impression into the bladder base by the enlarged prostate. There is no renal mass. GI AND BOWEL: Stomach demonstrates no acute abnormality. The gastric wall and unopacified small bowel are unremarkable, as visualized. The appendix is more prominent than on the last CT, measuring 10 mm, but is similar to 05/19/2024. There are no inflammatory changes. Clinical correlation recommended for potential early appendicitis. There is no longer any wall thickening in the ascending and transverse colon. The rectosigmoid surgical anastomosis is intact. Mild increased stranding is seen along the distal descending and rectosigmoid colon with either mild wall prominence or changes of underdistention. The findings are concerning for proctocolitis given the increased adjacent stranding. There is no bowel obstruction. PERITONEUM AND RETROPERITONEUM: No ascites. No free air. There are small umbilical and inguinal fat hernias but no incarcerated hernia. VASCULATURE: Aorta is normal in caliber. There is mild aortic atherosclerosis with no aneurysm or dissection. LYMPH NODES: No lymphadenopathy. REPRODUCTIVE ORGANS: The prostate is enlarged, measuring 5.5 cm transverse. There is a mild impression into the bladder base by the enlarged prostate. BONES AND SOFT TISSUES: There are degenerative changes of the thoracic and lumbar spine with no acute or significant osseous findings. No focal soft tissue abnormality. IMPRESSION: 1. Mild increased stranding along the distal descending and rectosigmoid colon, concerning for proctocolitis. 2. Prominent appendix measuring 10 mm, similar to 05/19/2024 but more prominent than 1 week ago , without inflammatory changes. Suggest clinical correlation for early appendicitis. 3. Enlarged  prostate measuring 5.5 cm transverse with mild impression into the bladder base. 4. Aortic and coronary artery atherosclerosis. Electronically signed by: Francis Quam MD 10/14/2024 08:22 PM EST RP Workstation: [id number]   LONG TERM MONITOR (3-14 DAYS) Result Date: 10/10/2024 Patch Wear Time:  3 days and 5 hours (2025-11-21T08:22:22-0500 to 2025-11-24T13:53:53-0500) Patient had a min HR of 47 bpm, max HR of 164 bpm, and avg HR of 89 bpm. Predominant underlying rhythm was Sinus Rhythm. 1 run of Supraventricular Tachycardia occurred lasting 12 beats with a max rate of 164 bpm (avg 136 bpm). Isolated SVEs were rare (<1.0%), SVE Couplets were rare (<1.0%), and SVE Triplets were rare (<1.0%). Isolated VEs were rare (<1.0%), and no VE Couplets or VE Triplets were present. Ventricular Trigeminy was present. Summary: Sinus rhythm, average HR 89 bpm. No significant arrhythmia. Diary recording noted during periods of sinus tachycardia. Reassuring monitor result. No atrial fibrillation, heart block, or  sustained arrhythmia.   CT ABDOMEN PELVIS W CONTRAST Result Date: 10/07/2024 CLINICAL DATA:  Abdominal pain.  History of sigmoid colectomy. EXAM: CT ABDOMEN AND PELVIS WITH CONTRAST TECHNIQUE: Multidetector CT imaging of the abdomen and pelvis was performed using the standard protocol following bolus administration of intravenous contrast. RADIATION DOSE REDUCTION: This exam was performed according to the departmental dose-optimization program which includes automated exposure control, adjustment of the mA and/or kV according to patient size and/or use of iterative reconstruction technique. CONTRAST:  OMNIPAQUE  IOHEXOL  300 MG/ML  SOLN COMPARISON:  05/19/2024 FINDINGS: Lower chest: No acute abnormality. Hepatobiliary: Stable benign left lobe hepatic cyst. Normal gallbladder. No biliary dilatation. Pancreas: Unremarkable. No pancreatic ductal dilatation or surrounding inflammatory changes. Spleen: Normal in size  without focal abnormality. Adrenals/Urinary Tract: Adrenal glands are unremarkable. Kidneys are normal, without renal calculi, focal lesion, or hydronephrosis. Bladder is unremarkable. Stomach/Bowel: Although decompressed, there may be some slight inflammation the transverse and ascending colon which may implicate some degree enteritis/colitis. Bowel shows no evidence of obstruction, ileus or lesion. The appendix is normal. No free intraperitoneal air. Intact and stable appearance rectosigmoid anastomosis without evidence to suggest anastomotic leak or breakdown. Vascular/Lymphatic: No significant vascular findings are present. No enlarged abdominal or pelvic lymph nodes. Reproductive: Prostate is unremarkable. Other: Small right inguinal hernia containing fat. Stable small umbilical hernia containing fat. No ascites or abscess. Musculoskeletal: No acute or significant osseous findings. IMPRESSION: 1. Although decompressed, there may be some slight inflammation of the transverse and ascending colon which may implicate some degree of enteritis/colitis. 2. Stable small umbilical hernia containing fat. 3. Small right inguinal hernia containing fat. 4. Intact rectosigmoid surgical anastomosis. Electronically Signed   By: Marcey Moan M.D.   On: 10/07/2024 10:21   ECHOCARDIOGRAM COMPLETE Result Date: 09/24/2024    ECHOCARDIOGRAM REPORT   Patient Name:   Randall Reed. Date of Exam: 09/24/2024 Medical Rec #:  [id number]           Height:       71.0 in Accession #:    [id number]          Weight:       264.2 lb Date of Birth:  Dec 15, 1970            BSA:          2.374 m Patient Age:    53 years            BP:           111/71 mmHg Patient Gender: M                   HR:           62 bpm. Exam Location:  [address] Procedure: 2D Echo, 3D Echo, Cardiac Doppler, Color Doppler and Strain Analysis            (Both Spectral and Color Flow Doppler were utilized during            procedure). Indications:    R07.2  Precordial pain; R06.9 DOE; R60.0 Lower extremity edema;                 R53.83 Fatigue  History:        Patient has prior history of Echocardiogram examinations, most                 recent 03/04/2024. CAD, Signs/Symptoms:Chest Pain, Dyspnea,  Fatigue and Edema; Risk Factors:Hypertension, Sleep Apnea and                 Non-Smoker. Patient complains of intermittent chest pains, DOE,                 overall fatigue and persisent leg swelling.  Sonographer:    Annabella Cater RVT, RDCS (AE), RDMS Referring Phys: [id number] MICHAEL COOPER IMPRESSIONS  1. Left ventricular ejection fraction, by estimation, is 50 to 55%. Left ventricular ejection fraction by 3D volume is 55 %. The left ventricle has low normal function. The left ventricle has no regional wall motion abnormalities. Left ventricular diastolic parameters are consistent with Grade I diastolic dysfunction (impaired relaxation). The average left ventricular global longitudinal strain is -15.3 %. The global longitudinal strain is abnormal.  2. Right ventricular systolic function is normal. The right ventricular size is normal.  3. 2.2 x 2.5 cm echolucency in the left lobe of the liver suggests a complex cyst.  4. The mitral valve is normal in structure. No evidence of mitral valve regurgitation. No evidence of mitral stenosis.  5. The aortic valve is tricuspid. Aortic valve regurgitation is not visualized. No aortic stenosis is present.  6. Aortic dilatation noted. There is borderline dilatation of the aortic root, measuring 39 mm.  7. The inferior vena cava is normal in size with greater than 50% respiratory variability, suggesting right atrial pressure of 3 mmHg. Comparison(s): No significant change from prior study. Prior images reviewed side by side. EF 50%, global hypokinesis, GLS -17.9%, asc AOR 40 mm. FINDINGS  Left Ventricle: Left ventricular ejection fraction, by estimation, is 50 to 55%. Left ventricular ejection fraction by 3D volume is 55 %.  The left ventricle has low normal function. The left ventricle has no regional wall motion abnormalities. The average left ventricular global longitudinal strain is -15.3 %. Strain was performed and the global longitudinal strain is abnormal. The left ventricular internal cavity size was normal in size. There is no left ventricular hypertrophy. Left ventricular diastolic parameters are consistent with Grade I diastolic dysfunction (impaired relaxation). Normal left ventricular filling pressure. Right Ventricle: The right ventricular size is normal. No increase in right ventricular wall thickness. Right ventricular systolic function is normal. Left Atrium: Left atrial size was normal in size. Right Atrium: Right atrial size was normal in size. Pericardium: 2.2 x 2.5 cm echolucency in the left lobe of the liver suggests a complex cyst. There is no evidence of pericardial effusion. Mitral Valve: The mitral valve is normal in structure. No evidence of mitral valve regurgitation. No evidence of mitral valve stenosis. Tricuspid Valve: The tricuspid valve is normal in structure. Tricuspid valve regurgitation is not demonstrated. No evidence of tricuspid stenosis. Aortic Valve: The aortic valve is tricuspid. Aortic valve regurgitation is not visualized. No aortic stenosis is present. Aortic valve mean gradient measures 3.0 mmHg. Aortic valve peak gradient measures 6.2 mmHg. Aortic valve area, by VTI measures 2.41 cm. Pulmonic Valve: The pulmonic valve was normal in structure. Pulmonic valve regurgitation is trivial. No evidence of pulmonic stenosis. Aorta: Aortic dilatation noted. There is borderline dilatation of the aortic root, measuring 39 mm. Venous: The inferior vena cava is normal in size with greater than 50% respiratory variability, suggesting right atrial pressure of 3 mmHg. IAS/Shunts: No atrial level shunt detected by color flow Doppler. Additional Comments: 3D was performed not requiring image post processing  on an independent workstation and was normal.  LEFT VENTRICLE PLAX 2D LVIDd:  5.33 cm         Diastology LVIDs:         3.91 cm         LV e' medial:    7.05 cm/s LV PW:         1.12 cm         LV E/e' medial:  10.3 LV IVS:        0.86 cm         LV e' lateral:   9.97 cm/s LVOT diam:     2.43 cm         LV E/e' lateral: 7.3 LV SV:         57 LV SV Index:   24              2D Longitudinal LVOT Area:     4.64 cm        Strain                                2D Strain GLS   -15.3 %                                Avg:                                 3D Volume EF                                LV 3D EF:    Left                                             ventricul                                             ar                                             ejection                                             fraction                                             by 3D                                             volume is  55 %.                                 3D Volume EF:                                3D EF:        55 %                                LV EDV:       130 ml                                LV ESV:       58 ml                                LV SV:        71 ml RIGHT VENTRICLE RV S prime:     11.50 cm/s  PULMONARY VEINS TAPSE (M-mode): 2.4 cm      Diastolic Velocity: 47.50 cm/s                             S/D Velocity:       1.30                             Systolic Velocity:  63.60 cm/s LEFT ATRIUM             Index        RIGHT ATRIUM           Index LA diam:        3.51 cm 1.48 cm/m   RA Area:     15.50 cm LA Vol (A2C):   31.6 ml 13.31 ml/m  RA Volume:   43.60 ml  18.37 ml/m LA Vol (A4C):   53.7 ml 22.62 ml/m LA Biplane Vol: 42.4 ml 17.86 ml/m  AORTIC VALVE                    PULMONIC VALVE AV Area (Vmax):    2.58 cm     PV Vmax:       1.11 m/s AV Area (Vmean):   2.28 cm     PV Peak grad:  5.0 mmHg AV Area (VTI):     2.41 cm AV Vmax:           124.00  cm/s AV Vmean:          82.800 cm/s AV VTI:            0.235 m AV Peak Grad:      6.2 mmHg AV Mean Grad:      3.0 mmHg LVOT Vmax:         68.90 cm/s LVOT Vmean:        40.700 cm/s LVOT VTI:          0.122 m LVOT/AV VTI ratio: 0.52  AORTA Ao Root diam: 3.88 cm Ao Asc diam:  3.53 cm Ao Arch diam: 3.3 cm MITRAL VALVE MV Area (PHT): 3.85 cm    SHUNTS MV Decel Time:  197 msec    Systemic VTI:  0.12 m MV E velocity: 72.40 cm/s  Systemic Diam: 2.43 cm MV A velocity: 86.80 cm/s MV E/A ratio:  0.83 Mihai Croitoru MD Electronically signed by Jerel Balding MD Signature Date/Time: 09/24/2024/2:32:37 PM    Final     DISCHARGE EXAMINATION: Vitals:   10/16/24 2135 10/17/24 0456 10/17/24 0830 10/17/24 0929  BP: 118/76 138/81  128/88  Pulse: (!) 107 74  86  Resp:  18  18  Temp:  98.5 F (36.9 C)  98 F (36.7 C)  TempSrc:      SpO2:  96% 97% 97%  Weight:      Height:       General appearance: Awake alert.  In no distress Resp: Clear to auscultation bilaterally.  Normal effort Cardio: S1-S2 is normal regular.  No S3-S4.  No rubs murmurs or bruit GI: Abdomen is soft.  Nontender nondistended.  Bowel sounds are present normal.  No masses organomegaly Extremities: No edema.  Full range of motion of lower extremities. Neurologic: Alert and oriented x3.  No focal neurological deficits.    DISPOSITION: Home  Discharge Instructions     Call MD for:  difficulty breathing, headache or visual disturbances   Complete by: As directed    Call MD for:  extreme fatigue   Complete by: As directed    Call MD for:  persistant dizziness or light-headedness   Complete by: As directed    Call MD for:  persistant nausea and vomiting   Complete by: As directed    Call MD for:  severe uncontrolled pain   Complete by: As directed    Call MD for:  temperature >100.4   Complete by: As directed    Discharge instructions   Complete by: As directed    Please be sure to follow-up with your primary care provider within 1  week after discharge.  Seek attention if your diarrhea recurs.  You were cared for by a hospitalist during your hospital stay. If you have any questions about your discharge medications or the care you received while you were in the hospital after you are discharged, you can call the unit and asked to speak with the hospitalist on call if the hospitalist that took care of you is not available. Once you are discharged, your primary care physician will handle any further medical issues. Please note that NO REFILLS for any discharge medications will be authorized once you are discharged, as it is imperative that you return to your primary care physician (or establish a relationship with a primary care physician if you do not have one) for your aftercare needs so that they can reassess your need for medications and monitor your lab values. If you do not have a primary care physician, you can call [phone] for a physician referral.   Increase activity slowly   Complete by: As directed          Allergies as of 10/17/2024       Reactions   Desloratadine Other (See Comments)   CLARINEX-severe headache CLARITIN - severe headache   Loratadine  Other (See Comments)   severe headache   Ketek [telithromycin] Itching, Rash   Levbid  [hyoscyamine  Sulfate] Rash        Medication List     STOP taking these medications    ciprofloxacin  500 MG tablet Commonly known as: Cipro    guaiFENesin  600 MG 12 hr tablet Commonly known as: Mucinex        TAKE  these medications    Airsupra  90-80 MCG/ACT Aero Generic drug: Albuterol -Budesonide  Inhale 2 puffs into the lungs every 4 (four) hours as needed. What changed: when to take this   albuterol  108 (90 Base) MCG/ACT inhaler Commonly known as: VENTOLIN  HFA INHALE 2 PUFFS INTO THE LUNGS EVERY 6 HOURS AS NEEDED FOR WHEEZING OR SHORTNESS OF BREATH   Allegra  Allergy 180 MG tablet Generic drug: fexofenadine  Take 180 mg by mouth daily.   ALPRAZolam   1 MG tablet Commonly known as: XANAX  TAKE 1 TABLET BY MOUTH TWICE DAILY AS NEEDED What changed: reasons to take this   anastrozole  1 MG tablet Commonly known as: ARIMIDEX  Take 0.5 mg by mouth once a week.   aspirin  EC 81 MG tablet Take 1 tablet (81 mg total) by mouth daily.   Breztri  Aerosphere 160-9-4.8 MCG/ACT Aero inhaler Generic drug: budesonide -glycopyrrolate -formoterol  Inhale 2 puffs into the lungs in the morning and at bedtime.   budesonide  0.5 MG/2ML nebulizer solution Commonly known as: PULMICORT  Take 0.5 mg by nebulization 2 (two) times daily as needed (wheezing / sob).   cyanocobalamin  1000 MCG tablet Commonly known as: VITAMIN B12 Take 1 tablet (1,000 mcg total) by mouth daily.   dicyclomine  20 MG tablet Commonly known as: BENTYL  Take 1 tablet (20 mg total) by mouth in the morning and at bedtime. What changed:  when to take this reasons to take this   doxycycline  100 MG tablet Commonly known as: VIBRA -TABS Take 2 tablets (200 mg total) by mouth daily as needed. What changed: reasons to take this   Dupixent 300 MG/2ML prefilled syringe Generic drug: dupilumab Inject 300 mg into the skin every 14 (fourteen) days.   emtricitabine -tenofovir  200-300 MG tablet Commonly known as: TRUVADA  TAKE 1 TABLET BY MOUTH DAILY   famotidine  40 MG tablet Commonly known as: PEPCID  Take 40 mg by mouth daily as needed for heartburn or indigestion.   FeroSul 325 (65 Fe) MG tablet Generic drug: ferrous sulfate  TAKE 1 TABLET(325 MG) BY MOUTH DAILY WITH BREAKFAST   Florastor 250 MG capsule Generic drug: saccharomyces boulardii Take 1 capsule (250 mg total) by mouth 2 (two) times daily.   furosemide  20 MG tablet Commonly known as: LASIX  Take 1 tablet (20 mg total) by mouth daily. As needed for edema What changed:  when to take this reasons to take this additional instructions   hydrocortisone  2.5 % rectal cream Commonly known as: ANUSOL -HC Place rectally 2 (two)  times daily.   ipratropium 0.06 % nasal spray Commonly known as: ATROVENT  Place 2 sprays into the nose 2 (two) times daily. What changed:  when to take this reasons to take this   metoprolol  succinate 50 MG 24 hr tablet Commonly known as: TOPROL -XL Take 1 tablet (50 mg total) by mouth daily. Take with or immediately following a meal. What changed: when to take this   mirabegron  ER 25 MG Tb24 tablet Commonly known as: MYRBETRIQ  Take 25 mg by mouth daily as needed (bladder spasm).   montelukast  10 MG tablet Commonly known as: SINGULAIR  TAKE 1 TABLET(10 MG) BY MOUTH AT BEDTIME   olopatadine  0.1 % ophthalmic solution Commonly known as: Pataday  Place 1 drop into both eyes 2 (two) times daily as needed for allergies.   ondansetron  4 MG disintegrating tablet Commonly known as: ZOFRAN -ODT Take 1 tablet (4 mg total) by mouth every 8 (eight) hours as needed for nausea or vomiting.   pantoprazole  40 MG tablet Commonly known as: PROTONIX  Take 1 tablet (40 mg total) by mouth  daily. What changed:  when to take this reasons to take this   Potassium Chloride  ER 20 MEQ Tbcr TAKE 1 TABLET(20 MEQ) BY MOUTH DAILY   rizatriptan  10 MG disintegrating tablet Commonly known as: MAXALT -MLT DISSOLVE 1 TABLET IN MOUTH AS NEEDED FOR HEADACHE. REPEAT  IN  2  HOURS  AS  NEEDED.   rosuvastatin  10 MG tablet Commonly known as: CRESTOR  Take 1 tablet (10 mg total) by mouth daily.   [name]  665-25 MCG/ACT Susp Generic drug: Olopatadine -Mometasone  Place 1 spray into the nose in the morning and at bedtime.   sildenafil  100 MG tablet Commonly known as: VIAGRA  Take 1/2-1 tablet by mouth once daily as needed prior to sexual activity *MUST KEEP UPCOMING APPT*   tadalafil  5 MG tablet Commonly known as: CIALIS  Take 5 mg by mouth daily at 12 noon.   tamsulosin  0.4 MG Caps capsule Commonly known as: FLOMAX  TAKE 1 CAPSULE(0.4 MG) BY MOUTH DAILY AS NEEDED What changed: See the new instructions.    testosterone  cypionate 200 MG/ML injection Commonly known as: DEPOTESTOSTERONE CYPIONATE Inject 1 mL into the muscle once a week.   valsartan  80 MG tablet Commonly known as: DIOVAN  Take 1 tablet (80 mg total) by mouth daily.   Vitamin D  (Ergocalciferol ) 1.25 MG (50000 UNIT) Caps capsule Commonly known as: DRISDOL  TAKE ONE CAPSULE BY MOUTH EVERY 7 DAYS What changed: additional instructions   Voquezna  10 MG Tabs Generic drug: Vonoprazan Fumarate  Take 1 tablet by mouth daily.   zolpidem  10 MG tablet Commonly known as: AMBIEN  TAKE 1 TABLET(10 MG) BY MOUTH AT BEDTIME          Follow-up Information     Randall Reyes SAUNDERS, MD. Schedule an appointment as soon as possible for a visit in 1 week(s).   Specialties: Family Medicine, Sports Medicine Why: post hospitalization follow up Contact information: [address] [place] [address] [phone]                 TOTAL DISCHARGE TIME: 35 minutes  Jessah Danser  [facility] Pager on [email or web address]  10/17/2024, 11:42 AM

## 2024-10-17 NOTE — Plan of Care (Signed)

## 2024-10-18 ENCOUNTER — Other Ambulatory Visit (HOSPITAL_COMMUNITY): Payer: Self-pay

## 2024-10-19 ENCOUNTER — Encounter: Payer: Self-pay | Admitting: Gastroenterology

## 2024-10-20 DIAGNOSIS — E230 Hypopituitarism: Secondary | ICD-10-CM | POA: Diagnosis not present

## 2024-10-21 ENCOUNTER — Other Ambulatory Visit: Payer: Self-pay | Admitting: Family Medicine

## 2024-10-21 ENCOUNTER — Ambulatory Visit: Admitting: Gastroenterology

## 2024-10-21 ENCOUNTER — Other Ambulatory Visit: Payer: Self-pay | Admitting: Physician Assistant

## 2024-10-21 VITALS — BP 110/70 | HR 79 | Ht 71.0 in | Wt 251.0 lb

## 2024-10-21 DIAGNOSIS — E538 Deficiency of other specified B group vitamins: Secondary | ICD-10-CM

## 2024-10-21 DIAGNOSIS — D509 Iron deficiency anemia, unspecified: Secondary | ICD-10-CM | POA: Diagnosis not present

## 2024-10-21 DIAGNOSIS — Z79899 Other long term (current) drug therapy: Secondary | ICD-10-CM

## 2024-10-21 DIAGNOSIS — K649 Unspecified hemorrhoids: Secondary | ICD-10-CM

## 2024-10-21 DIAGNOSIS — F5104 Psychophysiologic insomnia: Secondary | ICD-10-CM

## 2024-10-21 DIAGNOSIS — R194 Change in bowel habit: Secondary | ICD-10-CM | POA: Diagnosis not present

## 2024-10-21 DIAGNOSIS — K219 Gastro-esophageal reflux disease without esophagitis: Secondary | ICD-10-CM

## 2024-10-21 DIAGNOSIS — F411 Generalized anxiety disorder: Secondary | ICD-10-CM

## 2024-10-21 DIAGNOSIS — A044 Other intestinal Escherichia coli infections: Secondary | ICD-10-CM | POA: Diagnosis not present

## 2024-10-21 MED ORDER — BACLOFEN 10 MG PO TABS: 10.0000 mg | ORAL_TABLET | Freq: Every day | ORAL | 3 refills | Status: AC

## 2024-10-21 MED ORDER — CITRUCEL PO POWD: 1.0000 | Freq: Every day | ORAL | Status: AC

## 2024-10-21 MED ORDER — ACCRUFER 30 MG PO CAPS: 30.0000 mg | ORAL_CAPSULE | Freq: Two times a day (BID) | ORAL | 0 refills | Status: AC

## 2024-10-21 NOTE — Progress Notes (Signed)
 HPI :  53 year old male with a history of GERD, IBS-D, diverticulosis status post sigmoid resection for colonic stricture back in December 2024, iron deficiency, here for posthospital follow-up.  He has been followed by Dr. Aneita remotely and seen by our PA's in recent months, this is my first time seeing him in the office.  Recall for iron deficiency I did an EGD and colonoscopy for him in September.  EGD went well, no hiatal hernia or pathology noted to cause IDA.  Colonoscopy showed a patent surgical anastomosis, otherwise a few benign-appearing polyps but no concerning pathology, no inflammatory changes.  He also had a CT enterography done in July for abdominal pain and IDA which did not show any concerning pathology in his abdomen/small bowel to cause IDA or abdominal pain.  Unfortunately he was admitted to the hospital on December 4 with abdominal pain, diarrhea, fevers, rectal bleeding.  Stool study was positive for both enteroaggretative E. coli and enteroinvasive E. coli.  He was treated initially with Zosyn , was feeling improved, transition to oral Cipro  and discharged.  He states his symptoms persisted at home and got worse, got readmitted on December 10, was febrile.  Was given IV ceftriaxone  and Flagyl  as inpatient.  They discussed his case with ID, eventually antibiotics were stopped and he was discharged on December 13 in stable condition.  Renal function was normal.  White blood cell count was normal.  During the time of each hospitalization he had a CT scan of his abdomen pelvis, showing some nonspecific inflammation of his colon.  He also had a prominent appearing appendix.  He is here for follow-up.  He states he is improved from his hospital stay however symptoms have certainly not resolved.  He still has mostly looser stools, ranging about 4 times per day.  No further obvious bleeding, perhaps some mild bleeding on the toilet paper.  He states his hemorrhoids got quite inflamed from  all the diarrhea he had.  He has had no further fevers.  He does have some upper abdominal bloating, but not much gas.  He has been on Florastor upon discharge and has been taking Bentyl  twice daily which he normally takes.  He has had some nausea taking Zofran  as needed.  He has been on a BRAT diet and slow to advance right now.  Again while feeling improved certainly his symptoms have not resolved.  He denied any sick contacts or new food exposures in regards to where he got the E. coli.  He states the health department has contacted him for tracking purposes.  He has lost about 12 pounds since he was found to have this.  He otherwise inquires about his reflux symptoms.  Recall he has been on both Nexium  and Protonix  in the past at higher doses with incomplete resolution of his reflux symptoms.  He can experience pyrosis and regurgitation, often at night.  After his EGD we placed him on a trial of Voquenza 10 mg daily.  He thinks that does work slightly better than PPIs but he continues to have some nocturnal symptoms.  Has added Pepcid  as needed which does not really help.  Does not take any Maalox or Carafate.  His weight is about 251 pounds, BMI around 35.  We discussed options for management of this as well.   07/2022 EGD by Dr. Aneita: Mild gastritis, otherwise normal.  Treated with pantoprazole  40 Mg twice daily and famotidine  40 Mg nightly.   07/2022 colonoscopy: 1 small 6  mm ulcer at the ileocecal valve without bleeding.  Left-sided diverticulosis with narrowing of the colon.  Diverticular spasm.  Benign moderate stenosis measuring 3 cm in the sigmoid colon associated with diverticulosis.  Small external hemorrhoids.  No polyps.  Prep was adequate after extensive lavage.  10-year repeat.  Treated colon spasm with dicyclomine  20 Mg 3 times daily.   07/2022 EGD / Colon Pathology: 1. 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 Surgical [P], gastric, antrum and body - GASTRIC ANTRAL AND OXYNTIC MUCOSA WITH FEATURES OF REACTIVE GASTROPATHY - NEGATIVE FOR H. PYLORI ON H&E STAIN - NEGATIVE FOR INTESTINAL METAPLASIA OR MALIGNANCY     CT enterography 05/19/24: IMPRESSION: 1. Prior partial sigmoidectomy with reanastomosis. Small amount of stranding along the sigmoid colon may be postsurgical or reflect acute inflammation. Suggest clinical correlation. 2. Terminal ileum appears normal. 3. Colonic diverticulosis without evidence of acute diverticulitis. 4.  Aortic Atherosclerosis    EGD 07/10/24: - Esophagogastric landmarks were identified: the Z-line was found at 40 cm, the gastroesophageal junction was found at 40 cm and the upper extent of the gastric folds was found at 40 cm from the incisors. Findings: - The exam of the esophagus was otherwise normal. - A single 3 mm sessile polyp was found in the gastric fundus. The polyp was removed with a cold biopsy forceps. Resection and retrieval were complete. - The exam of the stomach was otherwise normal. - Biopsies were taken with a cold forceps for Helicobacter pylori testing. - The examined duodenum was normal. Biopsies for histology were taken with a cold forceps for evaluation of celiac disease.  Consider trial of Voquezna    Colonoscopy 07/10/24: - Hemorrhoids were found on perianal exam. - The terminal ileum appeared normal. - Scattered diverticula were found in the entire colon. - A 3 mm polyp was found in the ascending colon. The polyp was sessile. The polyp was removed with a cold snare. Resection and retrieval were complete. - A 3 mm polyp was found in the transverse colon. The polyp was sessile. The polyp was removed with a cold snare. Resection and retrieval were complete. - There was evidence of a prior end-to-end colo-colonic anastomosis in the sigmoid colon. This was patent and was characterized by  healthy appearing mucosa. - Internal hemorrhoids were found during retroflexion. - The exam was otherwise without abnormality.  FINAL DIAGNOSIS        1. Surgical [P], small bowel :       DUODENAL MUCOSA WITH NORMAL VILLOUS ARCHITECTURE.       NO VILLOUS ATROPHY OR INCREASED INTRAEPITHELIAL LYMPHOCYTES.        2. Surgical [P], gastric :       GASTRIC ANTRAL AND OXYNTIC MUCOSA WITH HYPEREMIA.       NEGATIVE FOR HELICOBACTER PYLORI.        3. Surgical [P], gastric polyp :       FUNDIC GLAND POLYPS.        4. Surgical [P], colon, ascending, transverse, polyp (2) :       COLONIC MUCOSA WITH BENIGN LYMPHOID AGGREGATE (2).       MULTIPLE ADDITIONAL LEVELS EXAMINED.     GI pathogen panel 10/08/24: Component Ref Range & Units (hover) 13 d ago  Campylobacter species NOT DETECTED  Plesimonas shigelloides NOT DETECTED  Salmonella species NOT DETECTED  Yersinia enterocolitica NOT DETECTED  Vibrio species NOT DETECTED  Vibrio cholerae NOT DETECTED  Enteroaggregative E coli (EAEC) DETECTED Abnormal   Comment: RESULT CALLED TO, READ BACK BY AND VERIFIED WITH: DELON DEL., RN AT 1649 10/09/24 RAM  Enteropathogenic E coli (EPEC) NOT DETECTED  Enterotoxigenic E coli (ETEC) NOT DETECTED  Shiga like toxin producing E coli (STEC) NOT DETECTED  Shigella/Enteroinvasive E coli (EIEC) DETECTED Abnormal   Comment: RESULT CALLED TO, READ BACK BY AND VERIFIED WITH: DELON DEL., RN AT 1649 10/09/24 RAM  Cryptosporidium NOT DETECTED  Cyclospora cayetanensis NOT DETECTED  Entamoeba histolytica NOT DETECTED  Giardia lamblia NOT DETECTED  Adenovirus F40/41 NOT DETECTED  Astrovirus NOT DETECTED  Norovirus GI/GII NOT DETECTED  Rotavirus A NOT DETECTED  Sapovirus (I, II, IV, and V) NOT DETECTED    C diff negative   CT abdomen / pelvis 10/07/24: IMPRESSION: 1. Although decompressed, there may be some slight inflammation of the transverse and ascending colon which may implicate some degree of  enteritis/colitis. 2. Stable small umbilical hernia containing fat. 3. Small right inguinal hernia containing fat. 4. Intact rectosigmoid surgical anastomosis.   CT abdomen / pelvis 10/14/24: IMPRESSION: 1. Mild increased stranding along the distal descending and rectosigmoid colon, concerning for proctocolitis. 2. Prominent appendix measuring 10 mm, similar to 05/19/2024 but more prominent than 1 week ago , without inflammatory changes. Suggest clinical correlation for early appendicitis. 3. Enlarged prostate measuring 5.5 cm transverse with mild impression into the bladder base. 4. Aortic and coronary artery atherosclerosis.     Past Medical History:  Diagnosis Date   Allergy    Anemia    Anxiety    Arthritis    Asthma    CAD (coronary artery disease) 03/04/2024   CHF (congestive heart failure) (HCC) 03/04/2024   EF 50%   Complication of anesthesia    oxygen levelds dropped  with colonoscopy   Depression    Diverticulitis    Dyspnea    sometimes with asthma has sob   GERD (gastroesophageal reflux disease)    Headache    Hyperlipidemia    [employer]reesia 12/05/2020   Hypertension    Pneumonia    Seasonal allergic reaction    Sleep apnea      Past Surgical History:  Procedure Laterality Date   CIRCUMCISION  10/2022   COLON SURGERY  10/11/2023   COLONOSCOPY  07/17/2022   FLEXIBLE SIGMOIDOSCOPY N/A 10/11/2023   Procedure: FLEXIBLE SIGMOIDOSCOPY;  Surgeon: Teresa Lonni HERO, MD;  Location: [facility];  Service: General;  Laterality: N/A;   LIPOMA EXCISION N/A 10/30/2018   Procedure: EXCISION OF MULTIPLE SUBCUTANEOUS LIPOMAS ON TORSO;  Surgeon: Belinda Cough, MD;  Location: [facility];  Service: General;  Laterality: N/A;   UPPER GASTROINTESTINAL ENDOSCOPY  07/17/2022   XI ROBOTIC ASSISTED LOWER ANTERIOR RESECTION N/A 10/11/2023   Procedure: XI ROBOTIC ASSISTED LOWER ANTERIOR RESECTION, SIGMOIDECTOMY WITH INTRAOPERATIVE ASSESSMENT OF PERFUSION ICG,  BILATERAL TAP BLOCK;  Surgeon: Teresa Lonni HERO, MD;  Location: [facility];  Service: General;  Laterality: N/A;  180   Family History  Problem Relation Age of Onset   Coronary artery disease Father 70       stents, CABG   Heart attack Father    Diabetes Father    Early death Father    Heart disease Father    Hypertension Father    Obesity Father    Varicose Veins Father    Heart attack Maternal Grandfather    Cancer Paternal Grandmother    Diabetes Paternal Grandmother    Hypertension Paternal  Grandmother    Vision loss Paternal Grandmother    Varicose Veins Paternal Grandmother    Hearing loss Paternal Grandmother    Coronary artery disease Paternal Grandfather    Cancer Paternal Grandfather    Diabetes Paternal Grandfather    Heart disease Paternal Grandfather    Hypertension Paternal Grandfather    Obesity Paternal Grandfather    Hypertension Other    Heart attack Other    Cancer Other    Breast cancer Other    Hyperlipidemia Other    Diabetes Paternal Aunt    Early death Paternal Aunt    Heart disease Paternal Aunt    Hypertension Paternal Aunt    Obesity Paternal Aunt    Stomach cancer Neg Hx    Esophageal cancer Neg Hx    Colon cancer Neg Hx    Colon polyps Neg Hx    Rectal cancer Neg Hx    Social History[1] Current Outpatient Medications  Medication Sig Dispense Refill   AIRSUPRA  90-80 MCG/ACT AERO Inhale 2 puffs into the lungs every 4 (four) hours as needed. (Patient taking differently: Inhale 2 puffs into the lungs 2 (two) times daily.)     albuterol  (VENTOLIN  HFA) 108 (90 Base) MCG/ACT inhaler INHALE 2 PUFFS INTO THE LUNGS EVERY 6 HOURS AS NEEDED FOR WHEEZING OR SHORTNESS OF BREATH 8.5 g 0   ALLEGRA  ALLERGY 180 MG tablet Take 180 mg by mouth daily.     ALPRAZolam  (XANAX ) 1 MG tablet TAKE 1 TABLET BY MOUTH TWICE DAILY AS NEEDED (Patient taking differently: Take 1 mg by mouth 2 (two) times daily as needed for anxiety or sleep.) 60 tablet 0   anastrozole   (ARIMIDEX ) 1 MG tablet Take 0.5 mg by mouth once a week.     aspirin  EC 81 MG tablet Take 1 tablet (81 mg total) by mouth daily.     baclofen  (LIORESAL ) 10 MG tablet Take 1 tablet (10 mg total) by mouth at bedtime. 30 tablet 3   Budeson-Glycopyrrol-Formoterol  (BREZTRI  AEROSPHERE) 160-9-4.8 MCG/ACT AERO Inhale 2 puffs into the lungs in the morning and at bedtime.     budesonide  (PULMICORT ) 0.5 MG/2ML nebulizer solution Take 0.5 mg by nebulization 2 (two) times daily as needed (wheezing / sob).     cyanocobalamin  (VITAMIN B12) 1000 MCG tablet Take 1 tablet (1,000 mcg total) by mouth daily. 30 tablet 3   dicyclomine  (BENTYL ) 20 MG tablet Take 1 tablet (20 mg total) by mouth in the morning and at bedtime. (Patient taking differently: Take 20 mg by mouth 2 (two) times daily as needed for spasms.) 60 tablet 2   doxycycline  (VIBRA -TABS) 100 MG tablet Take 2 tablets (200 mg total) by mouth daily as needed. (Patient taking differently: Take 200 mg by mouth daily as needed (infection/exposure).) 10 tablet 3   DUPIXENT 300 MG/2ML prefilled syringe Inject 300 mg into the skin every 14 (fourteen) days.     famotidine  (PEPCID ) 40 MG tablet Take 40 mg by mouth daily as needed for heartburn or indigestion.     FEROSUL 325 (65 Fe) MG tablet TAKE 1 TABLET(325 MG) BY MOUTH DAILY WITH BREAKFAST 30 tablet 3   furosemide  (LASIX ) 20 MG tablet Take 1 tablet (20 mg total) by mouth daily. As needed for edema (Patient taking differently: Take 20 mg by mouth daily as needed for fluid or edema.) 90 tablet 3   hydrocortisone  (ANUSOL -HC) 2.5 % rectal cream Place rectally 2 (two) times daily. 30 g 0   ipratropium (ATROVENT ) 0.06 % nasal  spray Place 2 sprays into the nose 2 (two) times daily. (Patient taking differently: Place 2 sprays into the nose as needed for rhinitis.) 15 mL 11   metoprolol  succinate (TOPROL -XL) 50 MG 24 hr tablet Take 1 tablet (50 mg total) by mouth daily. Take with or immediately following a meal. (Patient  taking differently: Take 50 mg by mouth at bedtime. Take with or immediately following a meal.) 90 tablet 3   mirabegron  ER (MYRBETRIQ ) 25 MG TB24 tablet Take 25 mg by mouth daily as needed (bladder spasm).     montelukast  (SINGULAIR ) 10 MG tablet TAKE 1 TABLET(10 MG) BY MOUTH AT BEDTIME 90 tablet 3   olopatadine  (PATADAY ) 0.1 % ophthalmic solution Place 1 drop into both eyes 2 (two) times daily as needed for allergies. 5 mL 12   Olopatadine -Mometasone  ([name] ) 665-25 MCG/ACT SUSP Place 1 spray into the nose in the morning and at bedtime.     ondansetron  (ZOFRAN -ODT) 4 MG disintegrating tablet Take 1 tablet (4 mg total) by mouth every 8 (eight) hours as needed for nausea or vomiting. 20 tablet 0   pantoprazole  (PROTONIX ) 40 MG tablet Take 1 tablet (40 mg total) by mouth daily. (Patient taking differently: Take 40 mg by mouth daily as needed (acid reflux).) 180 tablet 1   Potassium Chloride  ER 20 MEQ TBCR TAKE 1 TABLET(20 MEQ) BY MOUTH DAILY 90 tablet 3   rizatriptan  (MAXALT -MLT) 10 MG disintegrating tablet DISSOLVE 1 TABLET IN MOUTH AS NEEDED FOR HEADACHE. REPEAT  IN  2  HOURS  AS  NEEDED. 9 tablet 2   rosuvastatin  (CRESTOR ) 10 MG tablet Take 1 tablet (10 mg total) by mouth daily. 90 tablet 3   saccharomyces boulardii (FLORASTOR) 250 MG capsule Take 1 capsule (250 mg total) by mouth 2 (two) times daily. 60 capsule 0   sildenafil  (VIAGRA ) 100 MG tablet Take 1/2-1 tablet by mouth once daily as needed prior to sexual activity *MUST KEEP UPCOMING APPT* 90 tablet 3   tadalafil  (CIALIS ) 5 MG tablet Take 5 mg by mouth daily at 12 noon.     tamsulosin  (FLOMAX ) 0.4 MG CAPS capsule TAKE 1 CAPSULE(0.4 MG) BY MOUTH DAILY AS NEEDED (Patient taking differently: Take 0.4 mg by mouth daily after supper.) 90 capsule 0   testosterone  cypionate (DEPOTESTOSTERONE CYPIONATE) 200 MG/ML injection Inject 1 mL into the muscle once a week.     valsartan  (DIOVAN ) 80 MG tablet Take 1 tablet (80 mg total) by mouth daily. 90  tablet 3   Vitamin D , Ergocalciferol , (DRISDOL ) 1.25 MG (50000 UNIT) CAPS capsule TAKE ONE CAPSULE BY MOUTH EVERY 7 DAYS (Patient taking differently: TAKE ONE CAPSULE BY MOUTH EVERY 7 DAYS Friday) 15 capsule 1   Vonoprazan Fumarate  (VOQUEZNA ) 10 MG TABS Take 1 tablet by mouth daily. 90 tablet 3   zolpidem  (AMBIEN ) 10 MG tablet TAKE 1 TABLET(10 MG) BY MOUTH AT BEDTIME 90 tablet 0   emtricitabine -tenofovir  (TRUVADA ) 200-300 MG tablet TAKE 1 TABLET BY MOUTH DAILY 90 tablet 1   No current facility-administered medications for this visit.   Allergies[2]   Review of Systems: All systems reviewed and negative except where noted in HPI.    CT ABDOMEN PELVIS W CONTRAST Result Date: 10/14/2024 EXAM: CT ABDOMEN AND PELVIS WITH CONTRAST 10/14/2024 07:58:50 PM TECHNIQUE: CT of the abdomen and pelvis was performed with the administration of 100 mL of iohexol  (OMNIPAQUE ) 300 MG/ML solution. Multiplanar reformatted images are provided for review. Automated exposure control, iterative reconstruction, and/or weight-based adjustment of the mA/kV  was utilized to reduce the radiation dose to as low as reasonably achievable. COMPARISON: CT with iv contrast 10/07/2024, CT with iv contrast 05/19/2024, and CT abdomen and pelvis 05/26/2021. CLINICAL HISTORY: Diarrhea. Patient was recently admitted with colitis and is also having difficulty urinating. FINDINGS: LOWER CHEST: There is an elevated right hemidiaphragm. Lung bases are clear of infiltrates. The cardiac size is normal. There is scattered calcification in the proximal LAD coronary artery. LIVER: The liver is mildly steatotic. There is a 2.9 cm septated cyst in segment 2, stable back to CT abdomen and pelvis from 05/26/2021. Consistent with a benign cyst. There is no mass enhancement. GALLBLADDER AND BILE DUCTS: Gallbladder is unremarkable. No biliary ductal dilatation. SPLEEN: The spleen is mildly prominent, measuring 14 cm in length. No mass. PANCREAS: No acute  abnormality. ADRENAL GLANDS: No acute abnormality. There is no adrenal mass. KIDNEYS, URETERS AND BLADDER: No stones in the kidneys or ureters. No hydronephrosis. No perinephric or periureteral stranding. The bladder is unremarkable for the degree of distention. There is a mild impression into the bladder base by the enlarged prostate. There is no renal mass. GI AND BOWEL: Stomach demonstrates no acute abnormality. The gastric wall and unopacified small bowel are unremarkable, as visualized. The appendix is more prominent than on the last CT, measuring 10 mm, but is similar to 05/19/2024. There are no inflammatory changes. Clinical correlation recommended for potential early appendicitis. There is no longer any wall thickening in the ascending and transverse colon. The rectosigmoid surgical anastomosis is intact. Mild increased stranding is seen along the distal descending and rectosigmoid colon with either mild wall prominence or changes of underdistention. The findings are concerning for proctocolitis given the increased adjacent stranding. There is no bowel obstruction. PERITONEUM AND RETROPERITONEUM: No ascites. No free air. There are small umbilical and inguinal fat hernias but no incarcerated hernia. VASCULATURE: Aorta is normal in caliber. There is mild aortic atherosclerosis with no aneurysm or dissection. LYMPH NODES: No lymphadenopathy. REPRODUCTIVE ORGANS: The prostate is enlarged, measuring 5.5 cm transverse. There is a mild impression into the bladder base by the enlarged prostate. BONES AND SOFT TISSUES: There are degenerative changes of the thoracic and lumbar spine with no acute or significant osseous findings. No focal soft tissue abnormality. IMPRESSION: 1. Mild increased stranding along the distal descending and rectosigmoid colon, concerning for proctocolitis. 2. Prominent appendix measuring 10 mm, similar to 05/19/2024 but more prominent than 1 week ago , without inflammatory changes. Suggest  clinical correlation for early appendicitis. 3. Enlarged prostate measuring 5.5 cm transverse with mild impression into the bladder base. 4. Aortic and coronary artery atherosclerosis. Electronically signed by: Francis Quam MD 10/14/2024 08:22 PM EST RP Workstation: [id number]   LONG TERM MONITOR (3-14 DAYS) Result Date: 10/10/2024 Patch Wear Time:  3 days and 5 hours (2025-11-21T08:22:22-0500 to 2025-11-24T13:53:53-0500) Patient had a min HR of 47 bpm, max HR of 164 bpm, and avg HR of 89 bpm. Predominant underlying rhythm was Sinus Rhythm. 1 run of Supraventricular Tachycardia occurred lasting 12 beats with a max rate of 164 bpm (avg 136 bpm). Isolated SVEs were rare (<1.0%), SVE Couplets were rare (<1.0%), and SVE Triplets were rare (<1.0%). Isolated VEs were rare (<1.0%), and no VE Couplets or VE Triplets were present. Ventricular Trigeminy was present. Summary: Sinus rhythm, average HR 89 bpm. No significant arrhythmia. Diary recording noted during periods of sinus tachycardia. Reassuring monitor result. No atrial fibrillation, heart block, or sustained arrhythmia.   CT ABDOMEN PELVIS W CONTRAST Result  Date: 10/07/2024 CLINICAL DATA:  Abdominal pain.  History of sigmoid colectomy. EXAM: CT ABDOMEN AND PELVIS WITH CONTRAST TECHNIQUE: Multidetector CT imaging of the abdomen and pelvis was performed using the standard protocol following bolus administration of intravenous contrast. RADIATION DOSE REDUCTION: This exam was performed according to the departmental dose-optimization program which includes automated exposure control, adjustment of the mA and/or kV according to patient size and/or use of iterative reconstruction technique. CONTRAST:  OMNIPAQUE  IOHEXOL  300 MG/ML  SOLN COMPARISON:  05/19/2024 FINDINGS: Lower chest: No acute abnormality. Hepatobiliary: Stable benign left lobe hepatic cyst. Normal gallbladder. No biliary dilatation. Pancreas: Unremarkable. No pancreatic ductal dilatation or  surrounding inflammatory changes. Spleen: Normal in size without focal abnormality. Adrenals/Urinary Tract: Adrenal glands are unremarkable. Kidneys are normal, without renal calculi, focal lesion, or hydronephrosis. Bladder is unremarkable. Stomach/Bowel: Although decompressed, there may be some slight inflammation the transverse and ascending colon which may implicate some degree enteritis/colitis. Bowel shows no evidence of obstruction, ileus or lesion. The appendix is normal. No free intraperitoneal air. Intact and stable appearance rectosigmoid anastomosis without evidence to suggest anastomotic leak or breakdown. Vascular/Lymphatic: No significant vascular findings are present. No enlarged abdominal or pelvic lymph nodes. Reproductive: Prostate is unremarkable. Other: Small right inguinal hernia containing fat. Stable small umbilical hernia containing fat. No ascites or abscess. Musculoskeletal: No acute or significant osseous findings. IMPRESSION: 1. Although decompressed, there may be some slight inflammation of the transverse and ascending colon which may implicate some degree of enteritis/colitis. 2. Stable small umbilical hernia containing fat. 3. Small right inguinal hernia containing fat. 4. Intact rectosigmoid surgical anastomosis. Electronically Signed   By: Marcey Moan M.D.   On: 10/07/2024 10:21   ECHOCARDIOGRAM COMPLETE Result Date: 09/24/2024    ECHOCARDIOGRAM REPORT   Patient Name:   Randall Reed. Date of Exam: 09/24/2024 Medical Rec #:  [id number]           Height:       71.0 in Accession #:    [id number]          Weight:       264.2 lb Date of Birth:  03-21-1971            BSA:          2.374 m Patient Age:    53 years            BP:           111/71 mmHg Patient Gender: M                   HR:           62 bpm. Exam Location:  [address] Procedure: 2D Echo, 3D Echo, Cardiac Doppler, Color Doppler and Strain Analysis            (Both Spectral and Color Flow Doppler were  utilized during            procedure). Indications:    R07.2 Precordial pain; R06.9 DOE; R60.0 Lower extremity edema;                 R53.83 Fatigue  History:        Patient has prior history of Echocardiogram examinations, most                 recent 03/04/2024. CAD, Signs/Symptoms:Chest Pain, Dyspnea,                 Fatigue and Edema; Risk Factors:Hypertension, Sleep Apnea  and                 Non-Smoker. Patient complains of intermittent chest pains, DOE,                 overall fatigue and persisent leg swelling.  Sonographer:    Annabella Cater RVT, RDCS (AE), RDMS Referring Phys: [id number] MICHAEL COOPER IMPRESSIONS  1. Left ventricular ejection fraction, by estimation, is 50 to 55%. Left ventricular ejection fraction by 3D volume is 55 %. The left ventricle has low normal function. The left ventricle has no regional wall motion abnormalities. Left ventricular diastolic parameters are consistent with Grade I diastolic dysfunction (impaired relaxation). The average left ventricular global longitudinal strain is -15.3 %. The global longitudinal strain is abnormal.  2. Right ventricular systolic function is normal. The right ventricular size is normal.  3. 2.2 x 2.5 cm echolucency in the left lobe of the liver suggests a complex cyst.  4. The mitral valve is normal in structure. No evidence of mitral valve regurgitation. No evidence of mitral stenosis.  5. The aortic valve is tricuspid. Aortic valve regurgitation is not visualized. No aortic stenosis is present.  6. Aortic dilatation noted. There is borderline dilatation of the aortic root, measuring 39 mm.  7. The inferior vena cava is normal in size with greater than 50% respiratory variability, suggesting right atrial pressure of 3 mmHg. Comparison(s): No significant change from prior study. Prior images reviewed side by side. EF 50%, global hypokinesis, GLS -17.9%, asc AOR 40 mm. FINDINGS  Left Ventricle: Left ventricular ejection fraction, by estimation, is 50 to  55%. Left ventricular ejection fraction by 3D volume is 55 %. The left ventricle has low normal function. The left ventricle has no regional wall motion abnormalities. The average left ventricular global longitudinal strain is -15.3 %. Strain was performed and the global longitudinal strain is abnormal. The left ventricular internal cavity size was normal in size. There is no left ventricular hypertrophy. Left ventricular diastolic parameters are consistent with Grade I diastolic dysfunction (impaired relaxation). Normal left ventricular filling pressure. Right Ventricle: The right ventricular size is normal. No increase in right ventricular wall thickness. Right ventricular systolic function is normal. Left Atrium: Left atrial size was normal in size. Right Atrium: Right atrial size was normal in size. Pericardium: 2.2 x 2.5 cm echolucency in the left lobe of the liver suggests a complex cyst. There is no evidence of pericardial effusion. Mitral Valve: The mitral valve is normal in structure. No evidence of mitral valve regurgitation. No evidence of mitral valve stenosis. Tricuspid Valve: The tricuspid valve is normal in structure. Tricuspid valve regurgitation is not demonstrated. No evidence of tricuspid stenosis. Aortic Valve: The aortic valve is tricuspid. Aortic valve regurgitation is not visualized. No aortic stenosis is present. Aortic valve mean gradient measures 3.0 mmHg. Aortic valve peak gradient measures 6.2 mmHg. Aortic valve area, by VTI measures 2.41 cm. Pulmonic Valve: The pulmonic valve was normal in structure. Pulmonic valve regurgitation is trivial. No evidence of pulmonic stenosis. Aorta: Aortic dilatation noted. There is borderline dilatation of the aortic root, measuring 39 mm. Venous: The inferior vena cava is normal in size with greater than 50% respiratory variability, suggesting right atrial pressure of 3 mmHg. IAS/Shunts: No atrial level shunt detected by color flow Doppler. Additional  Comments: 3D was performed not requiring image post processing on an independent workstation and was normal.  LEFT VENTRICLE PLAX 2D LVIDd:  5.33 cm         Diastology LVIDs:         3.91 cm         LV e' medial:    7.05 cm/s LV PW:         1.12 cm         LV E/e' medial:  10.3 LV IVS:        0.86 cm         LV e' lateral:   9.97 cm/s LVOT diam:     2.43 cm         LV E/e' lateral: 7.3 LV SV:         57 LV SV Index:   24              2D Longitudinal LVOT Area:     4.64 cm        Strain                                2D Strain GLS   -15.3 %                                Avg:                                 3D Volume EF                                LV 3D EF:    Left                                             ventricul                                             ar                                             ejection                                             fraction                                             by 3D                                             volume is  55 %.                                 3D Volume EF:                                3D EF:        55 %                                LV EDV:       130 ml                                LV ESV:       58 ml                                LV SV:        71 ml RIGHT VENTRICLE RV S prime:     11.50 cm/s  PULMONARY VEINS TAPSE (M-mode): 2.4 cm      Diastolic Velocity: 47.50 cm/s                             S/D Velocity:       1.30                             Systolic Velocity:  63.60 cm/s LEFT ATRIUM             Index        RIGHT ATRIUM           Index LA diam:        3.51 cm 1.48 cm/m   RA Area:     15.50 cm LA Vol (A2C):   31.6 ml 13.31 ml/m  RA Volume:   43.60 ml  18.37 ml/m LA Vol (A4C):   53.7 ml 22.62 ml/m LA Biplane Vol: 42.4 ml 17.86 ml/m  AORTIC VALVE                    PULMONIC VALVE AV Area (Vmax):    2.58 cm     PV Vmax:       1.11 m/s AV Area (Vmean):   2.28 cm     PV Peak grad:   5.0 mmHg AV Area (VTI):     2.41 cm AV Vmax:           124.00 cm/s AV Vmean:          82.800 cm/s AV VTI:            0.235 m AV Peak Grad:      6.2 mmHg AV Mean Grad:      3.0 mmHg LVOT Vmax:         68.90 cm/s LVOT Vmean:        40.700 cm/s LVOT VTI:          0.122 m LVOT/AV VTI ratio: 0.52  AORTA Ao Root diam: 3.88 cm Ao Asc diam:  3.53 cm Ao Arch diam: 3.3 cm MITRAL VALVE MV Area (PHT): 3.85 cm    SHUNTS MV Decel Time:  197 msec    Systemic VTI:  0.12 m MV E velocity: 72.40 cm/s  Systemic Diam: 2.43 cm MV A velocity: 86.80 cm/s MV E/A ratio:  0.83 Mihai Croitoru MD Electronically signed by Jerel Balding MD Signature Date/Time: 09/24/2024/2:32:37 PM    Final     Physical Exam: BP 110/70   Pulse 79   Ht 5' 11 (1.803 m)   Wt 251 lb (113.9 kg)   BMI 35.01 kg/m  Constitutional: Pleasant,well-developed, male in no acute distress. Neurological: Alert and oriented to person place and time. Skin: Skin is warm and dry. No rashes noted. Psychiatric: Normal mood and affect. Behavior is normal.   Lab Results  Component Value Date   WBC 6.8 10/16/2024   HGB 16.0 10/16/2024   HCT 49.1 10/16/2024   MCV 84.1 10/16/2024   PLT 233 10/16/2024    Lab Results  Component Value Date   NA 140 10/16/2024   CL 103 10/16/2024   K 4.1 10/16/2024   CO2 25 10/16/2024   BUN 7 10/16/2024   CREATININE 1.18 10/16/2024   GFRNONAA >60 10/16/2024   CALCIUM  9.3 10/16/2024   PHOS 3.0 10/10/2024   ALBUMIN 3.6 10/15/2024   GLUCOSE 85 10/16/2024    Lab Results  Component Value Date   ALT 22 10/15/2024   AST 19 10/15/2024   ALKPHOS 76 10/15/2024   BILITOT 0.5 10/15/2024     ASSESSMENT: 53 y.o. male here for assessment of the following  1. E. coli colitis   2. Altered bowel habits   3. Hemorrhoids, unspecified hemorrhoid type   4. Gastroesophageal reflux disease, unspecified whether esophagitis present   5. Long-term current use of proton pump inhibitor therapy   6. Iron deficiency anemia,  unspecified iron deficiency anemia type    Discussed recent hospitalization, his bowel changes, and GERD.  He appears to have had a pretty severe E. coli infection, unclear where he picked this up.  This took him several days to get over and resolve his acute symptoms, was on a few courses of antibiotics.  While he is doing much better now, he is still having some altered bowel habits and clearly not close to baseline.  I discussed with him that I think he had a severe E. coli infection acutely, and now probably has some post infectious functional change in his bowel.  This may take weeks to months to resolve completely.  He does appear to be slowly improving.  Fortunately his colonoscopy in September was completely normal.  I think the likelihood of IBD is very low at this time, but we will need to follow him closely over time to ensure resolution.  For now, he has been on scheduled Bentyl  at baseline, he can increase that to 3 times daily if needed.  To provide some regularity to his bowels, and perhaps bulk his stools a bit, recommend adding Citrucel once daily over-the-counter.  I had some samples of IBgard to use as needed, will see if that will help with his abdominal cramping as well.  Will see how he does with this regimen.  If his diarrhea persists or fails to resolve, I may add some Colestid  to his regimen.  Will hold off on that for now and await his course.  He will stay on BRAT diet currently and advance slowly over the next few weeks as he tolerates.  His hemorrhoids are understandably inflamed from the diarrhea he has had.  I provided him some Calmol 4 samples to use  as needed for irritation.  Hopefully that will really help him.  I expect hemorrhoids to improve with improvement of his bowels.  If for some reason his bowel function fails to improve with these measures and persists or worsens, he needs to let me know.  May do fecal calprotectin for persistent symptoms or consider flex  sig/colonoscopy, hopefully that is not needed.  We discussed his reflux for a bit.  He has had this chronically, has failed a few different high-dose PPIs.  Voquezna  at 10mg  / day has provided some benefit but has not resolved his symptoms.  He still has some nocturnal regurgitation and pyrosis.  Discussed other options.  I told him he should not combine PPIs and the Voquezna , would stay on Voquezna  at this time given it appears to work a bit better.  I offered him some baclofen  to use nightly to help with nocturnal symptoms, this may help sleep as well.  Discussed side effects of drowsiness, no medication interactions seen, we will see how he does with this.  He can also try some Maalox as needed nightly to help prevent symptoms.  I do think weight loss ultimately will help his symptoms long-term, he is working on this, unfortunately has lost significant weight due to his recent illness.  Over time, should his symptoms persist despite aggressive medical therapy, could consider TIF procedure for which I think he is a decent candidate if his BMI can get below 35.  Otherwise his EGD and colonoscopy, enterography, failed to show clear cause for his iron deficiency.  He has gotten some IV iron, interestingly his hemoglobin has remained normal.  Discussed new iron supplement called Accrufer , for which tolerance is apparently much better compared to other oral iron formulations.  I provided him some free samples of this, once he is feeling better his from a bowel perspective he can try taking that twice daily and if it works we can continue it.  Of note, prominent appendix noted on CT scan during the hospitalization.  He did not have symptoms for appendicitis.  May consider some follow-up imaging over time for reassessment.  I will see him in 1 to 2 months in the office for reassessment, asked him to contact me in the interim with any worsening so we can adjust his regimen as needed   PLAN: - continue  scheduled bentyl  - can take TID if needed - add citrucel once daily OTC - samples of IB gard to use PRN - samples given - Calmol4 PRN - samples given - consider adding colestid  pending his course - will await his course - continue BRAT diet, advance slowly as tolerated - continue Voquezna  10mg  / day, stop protonix   - trial of Baclofen  10mg  q HS for GERD = 30 day trial with refills - add Maalox PRN q HS OTC - weight loss should help GERD, he will work on this over time - [employer] given - consider TIF if symptoms persist despite  medical therapy and BMI < 35 - new oral iron supplement - Accrufer  - can trial / start once feeling better, can continue if needed - f/u 1-2 months for reassessment - consider repeating imaging of appendix over time  Marcey Naval, MD [facility]     [1]  Social History Tobacco Use   Smoking status: Never   Smokeless tobacco: Never  Vaping Use   Vaping status: Never Used  Substance Use Topics   Alcohol use: Yes    Alcohol/week: 1.0 standard  drink of alcohol    Types: 1 Standard drinks or equivalent per week    Comment: social-monthly   Drug use: Never  [2]  Allergies Allergen Reactions   Desloratadine Other (See Comments)    CLARINEX-severe headache CLARITIN - severe headache   Loratadine  Other (See Comments)    severe headache   Ketek [Telithromycin] Itching and Rash   Levbid  [Hyoscyamine  Sulfate] Rash

## 2024-10-21 NOTE — Telephone Encounter (Signed)
 Medication discussed at June 27 office visit.  Controlled substance database reviewed.  Alprazolam  1 mg #60 last filled on 09/28/2024, zolpidem  No. 90 on 07/21/2024.  Will order refills, alprazolam  to be filled closer to due date.

## 2024-10-21 NOTE — Patient Instructions (Signed)
 We have given you samples of the following medication to take: IBgard - take as directed as needed Calmol 4 suppositories- use as needed  Accrufer  30 mg (oral iron) Take twice a day on an empty stomach. (Note: Hold off starting this for about a month)  Continue Bentyl  on a schedule.  Can increase to three times a day as needed.  Please purchase the following medications over the counter and take as directed: Citrucel - take once daily Maalox - Use as needed at night  Continue Voquezna . (Stop Protonix  and Nexium )  We have sent the following medications to your pharmacy for you to pick up at your convenience: Baclofen  10 mg : Take daily at bedtime for reflux  You have been scheduled for a follow up appointment on Monday, 12-21-24 at 1:20pm. Please arrive 10 minutes early for registration. If you need to reschedule or cancel this appointment please call [phone] as soon as possible. Thank you.  Thank you for entrusting me with your care and for choosing [facility], Dr. Elspeth Naval    _______________________________________________________  If your blood pressure at your visit was 140/90 or greater, please contact your primary care physician to follow up on this.  _______________________________________________________  If you are age 27 or older, your body mass index should be between 23-30. Your Body mass index is 35.01 kg/m. If this is out of the aforementioned range listed, please consider follow up with your Primary Care Provider.  If you are age 69 or younger, your body mass index should be between 19-25. Your Body mass index is 35.01 kg/m. If this is out of the aformentioned range listed, please consider follow up with your Primary Care Provider.   ________________________________________________________  The [facility] providers would like to encourage you to use MYCHART to communicate with providers for non-urgent requests or questions.  Due to long hold  times on the telephone, sending your provider a message by [facility] may be a faster and more efficient way to get a response.  Please allow 48 business hours for a response.  Please remember that this is for non-urgent requests.  _______________________________________________________  [facility] is using a team-based approach to care.  Your team is made up of your doctor and two to three APPS. Our APPS (Nurse Practitioners and Physician Assistants) work with your physician to ensure care continuity for you. They are fully qualified to address your health concerns and develop a treatment plan. They communicate directly with your gastroenterologist to care for you. Seeing the Advanced Practice Practitioners on your physician's team can help you by facilitating care more promptly, often allowing for earlier appointments, access to diagnostic testing, procedures, and other specialty referrals.

## 2024-10-21 NOTE — Telephone Encounter (Signed)
 Requested Prescriptions   Pending Prescriptions Disp Refills   zolpidem  (AMBIEN ) 10 MG tablet [Pharmacy Med Name: ZOLPIDEM  10MG  TABLETS] 90 tablet     Sig: TAKE 1 TABLET(10 MG) BY MOUTH AT BEDTIME   ALPRAZolam  (XANAX ) 1 MG tablet [Pharmacy Med Name: ALPRAZOLAM  1MG  TABLETS] 60 tablet     Sig: TAKE 1 TABLET BY MOUTH TWICE DAILY AS NEEDED     Date of patient request: 10/21/2024 Last office visit: 10/21/2024 Upcoming visit: 12/10/2024 Date of last refill: 09/25/2024, 07/21/2024 Last refill amount: 90, 60

## 2024-10-21 NOTE — Telephone Encounter (Signed)
Okay to refill B12?

## 2024-10-22 ENCOUNTER — Other Ambulatory Visit (HOSPITAL_COMMUNITY): Payer: Self-pay

## 2024-10-22 ENCOUNTER — Other Ambulatory Visit: Payer: Self-pay

## 2024-10-22 ENCOUNTER — Telehealth: Payer: Self-pay | Admitting: Gastroenterology

## 2024-10-22 DIAGNOSIS — R197 Diarrhea, unspecified: Secondary | ICD-10-CM

## 2024-10-22 MED ORDER — COLESTIPOL HCL 1 G PO TABS
1.0000 g | ORAL_TABLET | Freq: Two times a day (BID) | ORAL | 1 refills | Status: AC
  Filled 2024-10-22 (×2): qty 60, 30d supply, fill #0
  Filled 2024-11-25: qty 60, 30d supply, fill #1

## 2024-10-22 NOTE — Telephone Encounter (Signed)
 Inbound call from patient stating that his diarrhea has changed. Patient has noticed that today he has only pure liquid coming out. Patient is requesting to speak to Jan. Please advise.

## 2024-10-22 NOTE — Telephone Encounter (Signed)
 Yes okay to refill

## 2024-10-22 NOTE — Telephone Encounter (Signed)
 Patient called with worsening diarrhea this morning and today.  Loose watery stools.  No fevers.  He is otherwise feeling okay.  We discussed some options.  I would like him to come to the lab tomorrow for CBC, ensure no significant leukocytosis and stable.  I would also like him to get rechecked for C. difficile to ensure negative given his recent hospitalization and antibiotic use.  Jan or POD A RN please note the patient will be stopping by the office tomorrow AM, I would like to do a Diatherix swab in the office, he can do the swab on his own in the bathroom and we can submit, likely will have a result back on Monday if we do that.  Otherwise I ordered Colestid  1 g twice daily to the [facility] pharmacy to see if that will help slow him down.  Again I think C. difficile is unlikely but should he have that would want to hold off on Imodium  ideally  If it does not work and is getting worse over the weekend, can consider trial of Imodium  but if fevers, return of bloody stool etc., he would need to go to the ED.  I ordered the CBC already, will await lab work to come back tomorrow and will let him know the results.  If any worsening otherwise he will let us  know

## 2024-10-22 NOTE — Telephone Encounter (Signed)
 Early this morning he started with explosive diarrhea. This lasted a couple of hours. At work through the day he has continued to have watery diarrhea off and on all day.  Patient reports he has maintained a bland diet. He was encouraged to push fluids to stay hydrated.  Only changes since yesterday's OV was he took Baclofen  last night. Has not started Citrucel.  Please advise.

## 2024-10-23 ENCOUNTER — Ambulatory Visit: Payer: Self-pay | Admitting: Gastroenterology

## 2024-10-23 ENCOUNTER — Other Ambulatory Visit (INDEPENDENT_AMBULATORY_CARE_PROVIDER_SITE_OTHER)

## 2024-10-23 ENCOUNTER — Other Ambulatory Visit: Payer: Self-pay

## 2024-10-23 DIAGNOSIS — R197 Diarrhea, unspecified: Secondary | ICD-10-CM

## 2024-10-23 LAB — CBC WITH DIFFERENTIAL/PLATELET
Basophils Absolute: 0 K/uL (ref 0.0–0.1)
Basophils Relative: 0.5 % (ref 0.0–3.0)
Eosinophils Absolute: 0 K/uL (ref 0.0–0.7)
Eosinophils Relative: 0.3 % (ref 0.0–5.0)
HCT: 49.9 % (ref 39.0–52.0)
Hemoglobin: 16.7 g/dL (ref 13.0–17.0)
Lymphocytes Relative: 18.6 % (ref 12.0–46.0)
Lymphs Abs: 1.7 K/uL (ref 0.7–4.0)
MCHC: 33.5 g/dL (ref 30.0–36.0)
MCV: 82.8 fl (ref 78.0–100.0)
Monocytes Absolute: 0.8 K/uL (ref 0.1–1.0)
Monocytes Relative: 9.4 % (ref 3.0–12.0)
Neutro Abs: 6.4 K/uL (ref 1.4–7.7)
Neutrophils Relative %: 71.2 % (ref 43.0–77.0)
Platelets: 237 K/uL (ref 150.0–400.0)
RBC: 6.03 Mil/uL — ABNORMAL HIGH (ref 4.22–5.81)
RDW: 17.9 % — ABNORMAL HIGH (ref 11.5–15.5)
WBC: 9 K/uL (ref 4.0–10.5)

## 2024-10-23 MED ORDER — ONDANSETRON 4 MG PO TBDP: 4.0000 mg | ORAL_TABLET | Freq: Three times a day (TID) | ORAL | 1 refills | Status: DC | PRN

## 2024-10-23 NOTE — Telephone Encounter (Signed)
 Jan-  Diatherix C Diff order has been printed at my desk. If you could place the kit at the 3rd floor front desk for him to get today that would be great and if he could return the specimen to the lab today, hopefully we could get a result Monday.

## 2024-10-23 NOTE — Telephone Encounter (Signed)
 Kit placed at front desk for patient to pick up today.

## 2024-10-27 ENCOUNTER — Ambulatory Visit

## 2024-10-27 ENCOUNTER — Other Ambulatory Visit: Payer: Self-pay

## 2024-10-27 VITALS — BP 106/65 | HR 82 | Temp 97.8°F | Resp 16 | Ht 71.0 in | Wt 245.0 lb

## 2024-10-27 DIAGNOSIS — D509 Iron deficiency anemia, unspecified: Secondary | ICD-10-CM

## 2024-10-27 MED ORDER — SODIUM CHLORIDE 0.9 % IV SOLN
510.0000 mg | Freq: Once | INTRAVENOUS | Status: AC
  Administered 2024-10-27: 510 mg via INTRAVENOUS
  Filled 2024-10-27: qty 17

## 2024-10-27 MED ORDER — ONDANSETRON 4 MG PO TBDP: 4.0000 mg | ORAL_TABLET | Freq: Three times a day (TID) | ORAL | 1 refills | Status: AC | PRN

## 2024-10-27 NOTE — Telephone Encounter (Signed)
 Diatherix C diff results came back undetected

## 2024-10-27 NOTE — Telephone Encounter (Signed)
 Got it, thank you. I sent him a mychart message with the results

## 2024-10-27 NOTE — Progress Notes (Signed)
 Diagnosis:  Iron Deficiency Anemia  Provider:  Praveen Mannam MD  Procedure: IV Infusion  IV Type: Peripheral, IV Location: R Forearm   Feraheme  (Ferumoxytol ), Dose: 510 mg  Infusion Start Time: 1349  Infusion Stop Time: 1405  Post Infusion IV Care: Observation period completed and Peripheral IV Discontinued  Discharge: Condition: Good, Destination: Home . AVS Declined  Performed by:  Leita FORBES Miles, LPN

## 2024-11-03 ENCOUNTER — Other Ambulatory Visit: Payer: Self-pay | Admitting: Family Medicine

## 2024-11-03 ENCOUNTER — Ambulatory Visit (INDEPENDENT_AMBULATORY_CARE_PROVIDER_SITE_OTHER)

## 2024-11-03 VITALS — BP 116/70 | HR 69 | Temp 98.1°F | Resp 16 | Ht 71.0 in | Wt 240.6 lb

## 2024-11-03 DIAGNOSIS — D509 Iron deficiency anemia, unspecified: Secondary | ICD-10-CM

## 2024-11-03 DIAGNOSIS — R7989 Other specified abnormal findings of blood chemistry: Secondary | ICD-10-CM

## 2024-11-03 MED ORDER — SODIUM CHLORIDE 0.9 % IV SOLN
510.0000 mg | Freq: Once | INTRAVENOUS | Status: AC
  Administered 2024-11-03: 510 mg via INTRAVENOUS
  Filled 2024-11-03: qty 17

## 2024-11-03 NOTE — Progress Notes (Signed)
 Diagnosis: Iron Deficiency Anemia  Provider:  Praveen Mannam MD  Procedure: IV Infusion  IV Type: Peripheral, IV Location: R Forearm  Feraheme  (Ferumoxytol ), Dose: 510 mg  Infusion Start Time: 1344  Infusion Stop Time: 1402  Post Infusion IV Care: Observation period completed and Peripheral IV Discontinued  Discharge: Condition: Good, Destination: Home . AVS Declined  Performed by:  Zoran Yankee, RN

## 2024-11-03 NOTE — Telephone Encounter (Signed)
 09/03/24 last Vitamin D  lab  10/14/24 last OV   Okay to refill?

## 2024-11-09 LAB — MISCELLANEOUS TEST

## 2024-11-11 ENCOUNTER — Encounter: Payer: Self-pay | Admitting: Gastroenterology

## 2024-11-23 ENCOUNTER — Other Ambulatory Visit: Payer: Self-pay | Admitting: Family Medicine

## 2024-11-23 DIAGNOSIS — F411 Generalized anxiety disorder: Secondary | ICD-10-CM

## 2024-11-24 NOTE — Telephone Encounter (Signed)
 Medication discussed at his June 27 visit.  Appointment scheduled on February 5.  Controlled substance database reviewed.  Alprazolam  1 mg last filled on 10/27/2024 for #60, previously 11/24, 10/25, 9/25.  No concerns, refill ordered.

## 2024-11-24 NOTE — Telephone Encounter (Signed)
 Requested Prescriptions   Pending Prescriptions Disp Refills   ALPRAZolam  (XANAX ) 1 MG tablet [Pharmacy Med Name: ALPRAZOLAM  1MG  TABLETS] 60 tablet     Sig: TAKE 1 TABLET BY MOUTH TWICE DAILY AS NEEDED     Date of patient request: 11/24/24 Last office visit: 10/14/2024 Upcoming visit: 12/10/2024 Date of last refill: 10/21/24 Last refill amount: 60

## 2024-12-07 ENCOUNTER — Ambulatory Visit: Admitting: Family Medicine

## 2024-12-10 ENCOUNTER — Telehealth: Payer: Self-pay

## 2024-12-10 ENCOUNTER — Ambulatory Visit: Admitting: Family Medicine

## 2024-12-10 ENCOUNTER — Encounter: Payer: Self-pay | Admitting: Family Medicine

## 2024-12-10 ENCOUNTER — Other Ambulatory Visit (HOSPITAL_COMMUNITY): Admission: RE | Admit: 2024-12-10 | Source: Ambulatory Visit

## 2024-12-10 VITALS — BP 94/58 | HR 94 | Temp 97.9°F | Resp 16 | Ht 71.0 in | Wt 234.4 lb

## 2024-12-10 DIAGNOSIS — E785 Hyperlipidemia, unspecified: Secondary | ICD-10-CM

## 2024-12-10 DIAGNOSIS — F411 Generalized anxiety disorder: Secondary | ICD-10-CM

## 2024-12-10 DIAGNOSIS — G43001 Migraine without aura, not intractable, with status migrainosus: Secondary | ICD-10-CM

## 2024-12-10 DIAGNOSIS — I10 Essential (primary) hypertension: Secondary | ICD-10-CM

## 2024-12-10 DIAGNOSIS — Z79899 Other long term (current) drug therapy: Secondary | ICD-10-CM

## 2024-12-10 DIAGNOSIS — R7303 Prediabetes: Secondary | ICD-10-CM

## 2024-12-10 DIAGNOSIS — E559 Vitamin D deficiency, unspecified: Secondary | ICD-10-CM

## 2024-12-10 DIAGNOSIS — K529 Noninfective gastroenteritis and colitis, unspecified: Secondary | ICD-10-CM

## 2024-12-10 DIAGNOSIS — E611 Iron deficiency: Secondary | ICD-10-CM

## 2024-12-10 DIAGNOSIS — K219 Gastro-esophageal reflux disease without esophagitis: Secondary | ICD-10-CM

## 2024-12-10 DIAGNOSIS — Z113 Encounter for screening for infections with a predominantly sexual mode of transmission: Secondary | ICD-10-CM

## 2024-12-10 DIAGNOSIS — F5104 Psychophysiologic insomnia: Secondary | ICD-10-CM

## 2024-12-10 LAB — CBC
HCT: 54.1 % — ABNORMAL HIGH (ref 39.0–52.0)
Hemoglobin: 18.2 g/dL (ref 13.0–17.0)
MCHC: 33.6 g/dL (ref 30.0–36.0)
MCV: 88.1 fl (ref 78.0–100.0)
Platelets: 165 10*3/uL (ref 150.0–400.0)
RBC: 6.14 Mil/uL — ABNORMAL HIGH (ref 4.22–5.81)
RDW: 17.9 % — ABNORMAL HIGH (ref 11.5–15.5)
WBC: 8.2 10*3/uL (ref 4.0–10.5)

## 2024-12-10 LAB — LIPID PANEL
Cholesterol: 114 mg/dL (ref 28–200)
HDL: 34.2 mg/dL — ABNORMAL LOW
LDL Cholesterol: 54 mg/dL (ref 10–99)
NonHDL: 79.86
Total CHOL/HDL Ratio: 3
Triglycerides: 130 mg/dL (ref 10.0–149.0)
VLDL: 26 mg/dL (ref 0.0–40.0)

## 2024-12-10 LAB — COMPREHENSIVE METABOLIC PANEL WITH GFR
ALT: 18 U/L (ref 3–53)
AST: 15 U/L (ref 5–37)
Albumin: 4.5 g/dL (ref 3.5–5.2)
Alkaline Phosphatase: 78 U/L (ref 39–117)
BUN: 17 mg/dL (ref 6–23)
CO2: 33 meq/L — ABNORMAL HIGH (ref 19–32)
Calcium: 9.3 mg/dL (ref 8.4–10.5)
Chloride: 103 meq/L (ref 96–112)
Creatinine, Ser: 1.13 mg/dL (ref 0.40–1.50)
GFR: 73.05 mL/min
Glucose, Bld: 81 mg/dL (ref 70–99)
Potassium: 4.2 meq/L (ref 3.5–5.1)
Sodium: 140 meq/L (ref 135–145)
Total Bilirubin: 0.7 mg/dL (ref 0.2–1.2)
Total Protein: 6.9 g/dL (ref 6.0–8.3)

## 2024-12-10 LAB — VITAMIN D 25 HYDROXY (VIT D DEFICIENCY, FRACTURES): VITD: 61.47 ng/mL (ref 30.00–100.00)

## 2024-12-10 LAB — HEMOGLOBIN A1C: Hgb A1c MFr Bld: 5 % (ref 4.6–6.5)

## 2024-12-10 NOTE — Patient Instructions (Addendum)
 Blood pressure was on the lower side today.  Monitor that at home and discuss readings and plan with cardiology, especially since you took the furosemide  today - watch for lower blood pressures.  No other medication changes.  If any concerns on labs I will let you know. Take care.

## 2024-12-10 NOTE — Telephone Encounter (Signed)
 Noted.  Patient has been notified previously by his urologist of elevated red blood cell count.  He did note that blood donation was recommended.  Please contact patient and let him know his hemoglobin was 18.2, recommend he discuss with his urologist regarding a plan for the elevated red blood cell count with his testosterone  use.  Let me know if there are questions.

## 2024-12-10 NOTE — Telephone Encounter (Signed)
 Called patient and [facility]. If patient calls back. Please relay message

## 2024-12-10 NOTE — Progress Notes (Unsigned)
 "  Subjective:  Patient ID: Randall JONETTA Floria Mickey., male    DOB: [birth year]  Age: 54 y.o. MRN: [id number]  CC:  Chief Complaint  Patient presents with   Follow-up    3 month follow up. Patient feels much better. Patient has had two iron infusions since he last saw you. He would like his iron and a1c checked today    HPI Randall Reed. presents for   Iron-deficiency anemia Followed by gastroenterology.  Status post 2 iron infusions, most recently 11/03/2024.  Feraheme .  GI resultant 10/23/2024, improving after starting Colestid .  Negative C. difficile testing.  Request iron level to be checked today.  Prior E. coli colitis in December.  Proctocolitis.  General surgery Dr. Teresa.  Sigmoidectomy in December 2024.  Also treated with Voquezna  for GERD. Also on baclofen  at bedtime. No new side effects.   Lab Results  Component Value Date   WBC 9.0 10/23/2024   HGB 16.7 10/23/2024   HCT 49.9 10/23/2024   MCV 82.8 10/23/2024   PLT 237.0 10/23/2024   Lab Results  Component Value Date   IRON 36 (L) 09/03/2024   TIBC 379 09/03/2024   FERRITIN 7 (L) 09/03/2024   Hypertension: Has been followed by cardiology, dosages of medications have been adjusted previously due to low blood pressures and fatigue, putting adjusting doses of metoprolol , Entresto .  Reading is somewhat low this morning. Home readings: none.  Taking lasix  every few days for edema - less with weight loss. Last dose this am. On potassium daily.  Toprol  50mg  at bedtime, valsartan  80mg  every day. No longer on entresto .  Slight fatigue. Some stress with work.  BP Readings from Last 3 Encounters:  12/10/24 (!) 94/58  11/03/24 116/70  10/27/24 106/65   Lab Results  Component Value Date   CREATININE 1.18 10/16/2024   Hyperlipidemia: Crestor  10 mg daily. No new side effects.  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  A1c has been monitored, testing again today. 30# wt loss with watching diet/exercise and with colitis - goal of 10# more weight loss.   Urology - Dr. Lovie - on tamsulosin , testosterone  - possible blood donation for elevated RBC. Lab Results  Component Value Date   HGBA1C 5.6 09/03/2024   Wt Readings from Last 3 Encounters:  12/10/24 234 lb 6.4 oz (106.3 kg)  11/03/24 240 lb 9.6 oz (109.1 kg)  10/27/24 245 lb (111.1 kg)   Anxiety/insomnia Generalized anxiety disorder with multiple attempts of different SSRIs previously without benefit or difficulty with tolerance.  Ultimately we have managed his symptoms with anywhere from 1 to 3/day with average of 2/day.  Consistent use of meds, without new side effects, although we have discussed potential risks of chronic use of benzodiazepines.  Insomnia has been well-controlled with Ambien  10 mg and he does not combine it with alprazolam .  Controlled substance database reviewed.  Alprazolam  No. 60 last filled on 11/25/2024, Ambien  No. 90 last filled on 10/22/2024. Denies marijuana or illicit drug use. Some days not needed, some days up to 3. Recent stressors but stable  use.   Migraine headaches Maxalt  as needed, 1 to 2/month.  If increased flares we have discussed need for headache specialist.  Change in weather and stress with work - will be losing job at end of month. Few additional HA's.   HIV preexposure prophylaxis, postexposure prophylaxis Truvada  for HIV preexposure prophylaxis, doxycycline  for postexposure prophylaxis.  Last STI screening in October. 3 new partners since last testing.   Vitamin D  deficiency 50k unit dosing weekly.  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   Fatigue 03/12/2021   Change in bowel habit 10/03/2020   Early satiety 10/03/2020   History of colonic polyps 10/03/2020   Screening for malignant neoplasm of colon 10/03/2020   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   Diarrhea 10/03/2020   Voice disturbance 03/12/2019   Coronary artery disease involving native coronary artery of native heart without angina pectoris 03/05/2019   Irritable bowel syndrome 03/14/2016   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   Asthma 11/19/2015   Sleep apnea 11/19/2015   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   rotator cuff tear 10/17/2013   Hypogonadism male 09/04/2013   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   ED (erectile dysfunction) 07/30/2013   Male erectile dysfunction, unspecified 07/30/2013   Biceps tendonitis 06/15/2013   Incomplete tear of rotator cuff 06/15/2013   Pain in upper limb 06/15/2013   Bilateral knee pain 11/07/2012   Right shoulder pain 11/07/2012   Palpitations 02/21/2012   BMI 33.0-33.9,adult 02/04/2012   Allergic rhinitis 02/04/2012   GERD (gastroesophageal reflux disease) 02/04/2012   Migraine 02/04/2012   GAD (generalized anxiety disorder) 02/04/2012   Insomnia 02/04/2012   Obesity 02/04/2012   Gastro-esophageal reflux disease without esophagitis 02/04/2012   Essential  hypertension 04/13/2009   CHEST PAIN-UNSPECIFIED 04/13/2009   Abdominal pain 04/13/2009   Past Medical History:  Diagnosis Date   Allergy    Anemia    Anxiety    Arthritis    Asthma    CAD (coronary artery disease) 03/04/2024   CHF (congestive heart failure) (HCC) 03/04/2024   EF 50%   Complication of anesthesia    oxygen levelds dropped  with colonoscopy   Depression    Diverticulitis    Dyspnea    sometimes with asthma has sob   GERD (gastroesophageal reflux disease)    Headache    Hyperlipidemia    Phreesia 12/05/2020   Hypertension    Pneumonia    Seasonal allergic reaction    Sleep apnea    Past Surgical History:  Procedure Laterality Date   CIRCUMCISION  10/2022   COLON SURGERY  10/11/2023   COLONOSCOPY  07/17/2022   FLEXIBLE SIGMOIDOSCOPY N/A 10/11/2023   Procedure: FLEXIBLE SIGMOIDOSCOPY;  Surgeon: Teresa Lonni HERO, MD;  Location: [facility];  Service: General;  Laterality: N/A;   LIPOMA EXCISION N/A 10/30/2018   Procedure: EXCISION OF MULTIPLE SUBCUTANEOUS LIPOMAS ON TORSO;  Surgeon: Belinda Cough, MD;  Location: [facility];  Service: General;  Laterality: N/A;   UPPER GASTROINTESTINAL ENDOSCOPY  07/17/2022   XI ROBOTIC ASSISTED LOWER ANTERIOR RESECTION N/A 10/11/2023   Procedure: XI ROBOTIC ASSISTED LOWER ANTERIOR RESECTION, SIGMOIDECTOMY WITH INTRAOPERATIVE ASSESSMENT OF PERFUSION ICG, BILATERAL TAP BLOCK;  Surgeon: Teresa Lonni HERO, MD;  Location: [facility];  Service: General;  Laterality: N/A;  180   Allergies[1] Prior to Admission medications  Medication Sig Start Date End Date Taking? Authorizing Provider  AIRSUPRA  90-80 MCG/ACT AERO Inhale 2 puffs into the lungs every 4 (four) hours as needed. Patient taking differently: Inhale 2 puffs into the lungs 2 (two) times daily. 03/30/23  Yes [provider]  albuterol  (VENTOLIN  HFA) 108 (90 Base) MCG/ACT inhaler INHALE 2 PUFFS INTO THE LUNGS EVERY 6 HOURS AS NEEDED FOR WHEEZING OR  SHORTNESS OF BREATH 07/19/20  Yes Levora Randall SAUNDERS, MD  ALLEGRA  ALLERGY 180 MG tablet Take 180 mg by mouth daily. 07/12/20  Yes [provider]  ALPRAZolam  (XANAX ) 1 MG tablet TAKE 1 TABLET BY MOUTH TWICE DAILY AS NEEDED 11/24/24  Yes Levora Randall SAUNDERS, MD  anastrozole  (ARIMIDEX ) 1 MG tablet Take 0.5 mg by mouth once a week.   Yes [provider]  aspirin  EC 81 MG tablet Take 1 tablet (81 mg total) by mouth daily. 03/07/15  Yes Wonda Sharper, MD  baclofen  (LIORESAL ) 10 MG tablet Take 1 tablet (10 mg total) by mouth at bedtime. 10/21/24  Yes Armbruster, Elspeth SQUIBB, MD  Budeson-Glycopyrrol-Formoterol  (BREZTRI  AEROSPHERE) 160-9-4.8 MCG/ACT AERO Inhale 2 puffs into the lungs in the morning and at bedtime. 09/14/21  Yes [provider]  budesonide  (PULMICORT ) 0.5 MG/2ML nebulizer solution Take 0.5 mg by nebulization 2 (two) times daily as needed (wheezing / sob). 06/29/24  Yes [provider]  colestipol  (COLESTID ) 1 g tablet Take 1 tablet (1 g total) by mouth 2 (two) times daily. 10/22/24  Yes Armbruster, Elspeth SQUIBB, MD  cyanocobalamin  (VITAMIN B12) 1000 MCG tablet TAKE 1 TABLET(1,000MCG TOTAL) BY MOUTH DAILY. 10/21/24  Yes Levora Randall SAUNDERS, MD  dicyclomine  (BENTYL ) 20 MG tablet Take 1 tablet (20 mg total) by mouth in the morning and at bedtime. Patient taking differently: Take 20 mg by mouth 2 (two) times daily as needed for spasms. 05/25/24  Yes [place], PA-C  doxycycline  (VIBRA -TABS) 100 MG tablet Take 2 tablets (200 mg total) by mouth daily as needed. Patient taking differently: Take 200 mg by mouth daily as needed (infection/exposure). 09/22/24  Yes Levora Randall SAUNDERS, MD  DUPIXENT 300 MG/2ML prefilled syringe Inject 300 mg into the skin every 14 (fourteen) days. 10/06/21  Yes [provider]  emtricitabine -tenofovir  (TRUVADA ) 200-300 MG tablet TAKE 1 TABLET BY MOUTH DAILY 10/21/24  Yes Levora Randall SAUNDERS, MD  famotidine  (PEPCID ) 40 MG tablet Take 40 mg by  mouth daily as needed for heartburn or indigestion. 02/18/20  Yes [provider]  Ferric Maltol  (ACCRUFER ) 30 MG CAPS Take 1 capsule (30 mg total) by mouth 2 (two) times daily. Take on an empty stomach.  Lot: [id number], exp: 02-2029 10/21/24  Yes Armbruster, Elspeth SQUIBB, MD  furosemide  (LASIX ) 20 MG tablet Take 1 tablet (20 mg total) by mouth daily. As needed for edema 09/03/24  Yes Wonda Sharper, MD  hydrocortisone  (ANUSOL -HC) 2.5 % rectal cream Place rectally 2 (two) times daily. 10/17/24  Yes Krishnan, Gokul, MD  ipratropium (ATROVENT ) 0.06 % nasal spray Place 2 sprays into the nose 2 (two) times daily. Patient taking differently: Place 2 sprays into the nose  as needed for rhinitis. 05/25/21  Yes Levora Randall SAUNDERS, MD  methylcellulose (CITRUCEL) oral powder Take 1 packet by mouth daily. 10/21/24  Yes Armbruster, Elspeth SQUIBB, MD  metoprolol  succinate (TOPROL -XL) 50 MG 24 hr tablet Take 1 tablet (50 mg total) by mouth daily. Take with or immediately following a meal. 09/03/24  Yes Wonda Sharper, MD  mirabegron  ER (MYRBETRIQ ) 25 MG TB24 tablet Take 25 mg by mouth daily as needed (bladder spasm).   Yes [provider]  montelukast  (SINGULAIR ) 10 MG tablet TAKE 1 TABLET(10 MG) BY MOUTH AT BEDTIME 03/23/24  Yes Levora Randall SAUNDERS, MD  olopatadine  (PATADAY ) 0.1 % ophthalmic solution Place 1 drop into both eyes 2 (two) times daily as needed for allergies. 01/17/24  Yes Jerrell Cleatus Ned, MD  Olopatadine -Mometasone  ([name] ) [phone] MCG/ACT SUSP Place 1 spray into the nose in the morning and at bedtime.   Yes [provider]  ondansetron  (ZOFRAN -ODT) 4 MG disintegrating tablet Take 1 tablet (4 mg total) by mouth every 8 (eight) hours as needed for nausea or vomiting. 10/27/24  Yes Armbruster, Elspeth SQUIBB, MD  Peppermint Oil (IBGARD) 90 MG CPCR Use as directed 10/21/24  Yes Armbruster, Elspeth SQUIBB, MD  Potassium Chloride  ER 20 MEQ TBCR TAKE 1 TABLET(20 MEQ) BY MOUTH DAILY 07/01/24  Yes Wonda Sharper, MD  Rectal Protectant-Emollient (CALMOL-4) 76-10 % SUPP Use as directed once to twice daily 10/21/24  Yes Armbruster, Elspeth SQUIBB, MD  rizatriptan  (MAXALT -MLT) 10 MG disintegrating tablet DISSOLVE 1 TABLET IN MOUTH AS NEEDED FOR HEADACHE. REPEAT  IN  2  HOURS  AS  NEEDED. 09/25/24  Yes Levora Randall SAUNDERS, MD  rosuvastatin  (CRESTOR ) 10 MG tablet Take 1 tablet (10 mg total) by mouth daily. 09/03/24  Yes Wonda Sharper, MD  sildenafil  (VIAGRA ) 100 MG tablet Take 1/2-1 tablet by mouth once daily as needed prior to sexual activity *MUST KEEP UPCOMING APPT* 04/02/23  Yes Wonda Sharper, MD  tadalafil  (CIALIS ) 5 MG tablet Take 5 mg by mouth daily at 12 noon. 02/01/23  Yes [provider]  tamsulosin  (FLOMAX ) 0.4 MG CAPS capsule TAKE 1 CAPSULE(0.4 MG) BY MOUTH DAILY AS NEEDED 09/17/24  Yes Levora Randall SAUNDERS, MD  testosterone  cypionate (DEPOTESTOSTERONE CYPIONATE) 200 MG/ML injection Inject 1 mL into the muscle once a week. 07/07/22  Yes [provider]  valsartan  (DIOVAN ) 80 MG tablet Take 1 tablet (80 mg total) by mouth daily. 09/03/24  Yes Wonda Sharper, MD  Vitamin D , Ergocalciferol , (DRISDOL ) 1.25 MG (50000 UNIT) CAPS capsule TAKE 1 CAPSULE BY MOUTH EVERY 7 DAYS. 11/03/24  Yes Levora Randall SAUNDERS, MD  Vonoprazan Fumarate  (VOQUEZNA ) 10 MG TABS Take 1 tablet by mouth daily. 07/14/24  Yes Armbruster, Elspeth SQUIBB, MD  zolpidem  (AMBIEN ) 10 MG tablet TAKE 1 TABLET(10 MG) BY MOUTH AT BEDTIME 10/21/24  Yes Levora Randall SAUNDERS, MD  pantoprazole  (PROTONIX ) 40 MG tablet Take 1 tablet (40 mg total) by mouth daily. Patient not taking: Reported on 12/10/2024 05/01/24   Levora Randall SAUNDERS, MD  dicyclomine  (BENTYL ) 20 MG tablet Take 20 mg by mouth 3 (three) times daily before meals.    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)  Occupation: [employer]  Tobacco Use   Smoking status: Never   Smokeless tobacco: Never  Vaping Use   Vaping status: Never Used  Substance and Sexual Activity   Alcohol use: Yes    Alcohol/week: 1.0 standard drink of alcohol    Types: 1 Standard drinks or equivalent per week    Comment: social-monthly   Drug use: Never   Sexual activity: Not Currently    Birth control/protection: Condom, Pill    Comment: Doxycycline  for non-protected sexual contact  Other Topics Concern   Not on file  Social History Narrative   Not on file   Social Drivers of Health   Tobacco Use: Low Risk (12/10/2024)   Patient History    Smoking Tobacco Use: Never    Smokeless Tobacco Use: Never    Passive Exposure: Not on file  Financial Resource Strain: Low Risk (09/01/2024)   Overall Financial Resource Strain (CARDIA)    Difficulty of Paying Living Expenses: Not very hard  Food Insecurity: No Food Insecurity (10/15/2024)   Epic    Worried About Radiation Protection Practitioner of Food in the Last Year: Never true    Ran Out of Food in the Last Year: Never true  Transportation Needs: No Transportation Needs (10/15/2024)   Epic    Lack of Transportation (Medical): No    Lack of Transportation (Non-Medical): No  Physical Activity: Sufficiently Active (09/01/2024)   Exercise Vital Sign    Days of Exercise per Week: 3 days    Minutes of Exercise per Session: 120 min  Stress: Stress Concern Present (09/01/2024)   [employer] of Occupational Health - Occupational Stress Questionnaire    Feeling of Stress: To some extent  Social Connections: Moderately Integrated (09/01/2024)   Social Connection and Isolation Panel    Frequency of Communication with Friends and Family: More than three times a week    Frequency of Social Gatherings with Friends and Family: Once a week    Attends Religious  Services: More than 4 times per year    Active Member of Clubs or Organizations: Yes    Attends Banker Meetings: More than 4 times per year    Marital Status: Never married  Intimate Partner Violence: Not At Risk (10/15/2024)   Epic    Fear of Current or Ex-Partner: No    Emotionally Abused: No    Physically Abused: No    Sexually Abused: No  Depression (PHQ2-9): Low Risk (12/10/2024)   Depression (PHQ2-9)    PHQ-2 Score: 1  Alcohol Screen: Low Risk (09/01/2024)   Alcohol Screen    Last Alcohol Screening Score (AUDIT): 2  Housing: Low Risk (10/15/2024)   Epic    Unable to Pay for Housing in the Last Year: No    Number of Times Moved in the Last Year: 0    Homeless in the Last Year: No  Utilities: Not At Risk (10/15/2024)   Epic    Threatened with loss of utilities: No  Health Literacy: Not on file    Review of Systems   Objective:   Vitals:   12/10/24 0805 12/10/24 0810  BP: 90/62 (!) 94/58  Pulse: 94   Resp: 16   Temp: 97.9 F (36.6 C)   TempSrc: Temporal   SpO2: 97%   Weight: 234 lb 6.4 oz (106.3 kg)   Height: 5' 11 (1.803 m)      Physical Exam Vitals reviewed.  Constitutional:      Appearance: He is well-developed.  HENT:  Head: Normocephalic and atraumatic.  Eyes:     Pupils: Pupils are equal, round, and reactive to light.  Neck:     Vascular: No carotid bruit or JVD.  Cardiovascular:     Rate and Rhythm: Normal rate and regular rhythm.     Heart sounds: Normal heart sounds. No murmur heard. Pulmonary:     Effort: Pulmonary effort is normal.     Breath sounds: Normal breath sounds. No rales.  Skin:    General: Skin is warm and dry.  Neurological:     General: No focal deficit present.     Mental Status: He is alert and oriented to person, place, and time.       I personally spent a total of *** minutes in the care of the patient today including {Time Based Coding:210964241}.  Assessment & Plan:  Elier Zellars. is a 54  y.o. male . GAD (generalized anxiety disorder) - Plan: POCT Urine Drug Screen  Chronic prescription benzodiazepine use - Plan: POCT Urine Drug Screen  Psychophysiological insomnia  On pre-exposure prophylaxis for HIV - Plan: HIV Antibody (routine testing w rflx), RPR W/RFLX TO RPR TITER, TREPONEMAL AB, SCREEN AND DIAGNOSIS, Urine cytology ancillary only  Routine screening for STI (sexually transmitted infection) - Plan: HIV Antibody (routine testing w rflx), RPR W/RFLX TO RPR TITER, TREPONEMAL AB, SCREEN AND DIAGNOSIS, Urine cytology ancillary only  Colitis presumed infectious  Gastroesophageal reflux disease, unspecified whether esophagitis present  Iron deficiency - Plan: CBC, Iron, TIBC and Ferritin Panel  Migraine without aura and with status migrainosus, not intractable  Essential hypertension  Prediabetes - Plan: Comprehensive metabolic panel with GFR, Hemoglobin A1c  Vitamin D  deficiency - Plan: Vitamin D  (25 hydroxy)  Hyperlipidemia, unspecified hyperlipidemia type - Plan: Lipid panel   No orders of the defined types were placed in this encounter.  Patient Instructions  Blood pressure was on the lower side today.  Monitor that at home and discuss readings and plan with cardiology, especially since you took the furosemide  today - watch for lower blood pressures.  No other medication changes.  If any concerns on labs I will let you know. Take care.     Signed,   Randall Pines, MD [facility], [facility] 12/10/24 8:57 AM      [1]  Allergies Allergen Reactions   Desloratadine Other (See Comments)    CLARINEX-severe headache CLARITIN - severe headache   Loratadine  Other (See Comments)    severe headache   Ketek [Telithromycin] Itching and Rash   Levbid  [Hyoscyamine  Sulfate] Rash   "

## 2024-12-10 NOTE — Telephone Encounter (Signed)
 [facility] lab calling in a critical lab result.  Caller name: Darice LELON Rushing #: [phone]  Critical Result: Hemoglobin 18.2   Provider has been made aware via phone note and direct message (teams/verbal)

## 2024-12-11 LAB — URINE CYTOLOGY ANCILLARY ONLY
Chlamydia: NEGATIVE
Comment: NEGATIVE
Comment: NEGATIVE
Comment: NORMAL
Neisseria Gonorrhea: NEGATIVE
Trichomonas: NEGATIVE

## 2024-12-11 LAB — IRON,TIBC AND FERRITIN PANEL
%SAT: 23 % (ref 20–48)
Ferritin: 98 ng/mL (ref 38–380)
Iron: 67 ug/dL (ref 50–180)
TIBC: 293 ug/dL (ref 250–425)

## 2024-12-11 LAB — HIV ANTIBODY (ROUTINE TESTING W REFLEX)
HIV 1&2 Ab, 4th Generation: NONREACTIVE
HIV FINAL INTERPRETATION: NEGATIVE

## 2024-12-21 ENCOUNTER — Ambulatory Visit: Admitting: Gastroenterology

## 2025-03-01 ENCOUNTER — Encounter: Admitting: Family Medicine
